# Patient Record
Sex: Female | Born: 1965
Health system: Southern US, Community
[De-identification: ages and names within clinical notes are randomized; demographics above are authoritative.]

## PROBLEM LIST (undated history)

## (undated) DIAGNOSIS — G4730 Sleep apnea, unspecified: Secondary | ICD-10-CM

## (undated) DIAGNOSIS — R4189 Other symptoms and signs involving cognitive functions and awareness: Secondary | ICD-10-CM

## (undated) DIAGNOSIS — E162 Hypoglycemia, unspecified: Secondary | ICD-10-CM

## (undated) DIAGNOSIS — K219 Gastro-esophageal reflux disease without esophagitis: Secondary | ICD-10-CM

## (undated) DIAGNOSIS — D649 Anemia, unspecified: Secondary | ICD-10-CM

## (undated) DIAGNOSIS — E039 Hypothyroidism, unspecified: Secondary | ICD-10-CM

## (undated) DIAGNOSIS — N189 Chronic kidney disease, unspecified: Secondary | ICD-10-CM

## (undated) DIAGNOSIS — J449 Chronic obstructive pulmonary disease, unspecified: Secondary | ICD-10-CM

## (undated) DIAGNOSIS — R198 Other specified symptoms and signs involving the digestive system and abdomen: Secondary | ICD-10-CM

## (undated) DIAGNOSIS — R112 Nausea with vomiting, unspecified: Secondary | ICD-10-CM

## (undated) DIAGNOSIS — Z9889 Other specified postprocedural states: Secondary | ICD-10-CM

## (undated) DIAGNOSIS — G44009 Cluster headache syndrome, unspecified, not intractable: Secondary | ICD-10-CM

## (undated) DIAGNOSIS — Z9981 Dependence on supplemental oxygen: Secondary | ICD-10-CM

## (undated) DIAGNOSIS — M5412 Radiculopathy, cervical region: Secondary | ICD-10-CM

## (undated) DIAGNOSIS — F419 Anxiety disorder, unspecified: Secondary | ICD-10-CM

## (undated) DIAGNOSIS — J939 Pneumothorax, unspecified: Secondary | ICD-10-CM

## (undated) DIAGNOSIS — R0602 Shortness of breath: Secondary | ICD-10-CM

## (undated) DIAGNOSIS — J4489 Other specified chronic obstructive pulmonary disease: Secondary | ICD-10-CM

## (undated) DIAGNOSIS — R7303 Prediabetes: Secondary | ICD-10-CM

## (undated) DIAGNOSIS — M1990 Unspecified osteoarthritis, unspecified site: Secondary | ICD-10-CM

## (undated) DIAGNOSIS — E2740 Unspecified adrenocortical insufficiency: Secondary | ICD-10-CM

## (undated) DIAGNOSIS — R2689 Other abnormalities of gait and mobility: Secondary | ICD-10-CM

## (undated) DIAGNOSIS — E785 Hyperlipidemia, unspecified: Secondary | ICD-10-CM

## (undated) DIAGNOSIS — G43909 Migraine, unspecified, not intractable, without status migrainosus: Secondary | ICD-10-CM

## (undated) HISTORY — PX: ABDOMINAL HYSTERECTOMY: SHX81

## (undated) HISTORY — PX: LOBECTOMY: SHX5089

## (undated) HISTORY — DX: Other symptoms and signs involving cognitive functions and awareness: R41.89

## (undated) HISTORY — PX: JOINT REPLACEMENT: SHX530

## (undated) HISTORY — PX: HERNIA REPAIR: SHX51

## (undated) HISTORY — PX: CHOLECYSTECTOMY: SHX55

## (undated) HISTORY — PX: CERVICAL FUSION: SHX112

## (undated) HISTORY — PX: TUBAL LIGATION: SHX77

## (undated) HISTORY — DX: Other specified symptoms and signs involving the digestive system and abdomen: R19.8

---

## 2001-12-20 ENCOUNTER — Inpatient Hospital Stay (HOSPITAL_COMMUNITY): Admission: EM | Admit: 2001-12-20 | Discharge: 2001-12-29 | Payer: Self-pay | Admitting: *Deleted

## 2001-12-20 ENCOUNTER — Encounter: Payer: Self-pay | Admitting: Family Medicine

## 2001-12-20 ENCOUNTER — Encounter: Payer: Self-pay | Admitting: *Deleted

## 2001-12-20 ENCOUNTER — Ambulatory Visit (HOSPITAL_COMMUNITY): Admission: RE | Admit: 2001-12-20 | Discharge: 2001-12-20 | Payer: Self-pay | Admitting: Family Medicine

## 2001-12-21 ENCOUNTER — Encounter (INDEPENDENT_AMBULATORY_CARE_PROVIDER_SITE_OTHER): Payer: Self-pay | Admitting: *Deleted

## 2001-12-21 ENCOUNTER — Encounter: Payer: Self-pay | Admitting: Cardiothoracic Surgery

## 2001-12-21 ENCOUNTER — Encounter: Payer: Self-pay | Admitting: General Surgery

## 2001-12-22 ENCOUNTER — Encounter: Payer: Self-pay | Admitting: Cardiothoracic Surgery

## 2001-12-23 ENCOUNTER — Encounter: Payer: Self-pay | Admitting: Cardiothoracic Surgery

## 2001-12-24 ENCOUNTER — Encounter: Payer: Self-pay | Admitting: Cardiothoracic Surgery

## 2001-12-25 ENCOUNTER — Encounter: Payer: Self-pay | Admitting: Cardiothoracic Surgery

## 2001-12-26 ENCOUNTER — Encounter: Payer: Self-pay | Admitting: Cardiothoracic Surgery

## 2001-12-27 ENCOUNTER — Encounter: Payer: Self-pay | Admitting: Cardiothoracic Surgery

## 2001-12-28 ENCOUNTER — Encounter: Payer: Self-pay | Admitting: Cardiothoracic Surgery

## 2001-12-29 ENCOUNTER — Encounter: Payer: Self-pay | Admitting: Cardiothoracic Surgery

## 2002-01-16 ENCOUNTER — Encounter: Admission: RE | Admit: 2002-01-16 | Discharge: 2002-01-16 | Payer: Self-pay | Admitting: Cardiothoracic Surgery

## 2002-01-16 ENCOUNTER — Encounter: Payer: Self-pay | Admitting: Cardiothoracic Surgery

## 2002-07-03 ENCOUNTER — Ambulatory Visit (HOSPITAL_COMMUNITY): Admission: RE | Admit: 2002-07-03 | Discharge: 2002-07-03 | Payer: Self-pay | Admitting: Family Medicine

## 2002-07-03 ENCOUNTER — Encounter: Payer: Self-pay | Admitting: Family Medicine

## 2003-06-22 ENCOUNTER — Other Ambulatory Visit: Admission: RE | Admit: 2003-06-22 | Discharge: 2003-06-22 | Payer: Self-pay | Admitting: Family Medicine

## 2006-05-07 ENCOUNTER — Other Ambulatory Visit: Admission: RE | Admit: 2006-05-07 | Discharge: 2006-05-07 | Payer: Self-pay | Admitting: Family Medicine

## 2006-05-15 ENCOUNTER — Encounter: Admission: RE | Admit: 2006-05-15 | Discharge: 2006-05-15 | Payer: Self-pay | Admitting: Family Medicine

## 2006-05-16 ENCOUNTER — Encounter: Admission: RE | Admit: 2006-05-16 | Discharge: 2006-05-16 | Payer: Self-pay | Admitting: Family Medicine

## 2006-05-21 ENCOUNTER — Ambulatory Visit: Payer: Self-pay | Admitting: Gastroenterology

## 2006-05-21 LAB — CONVERTED CEMR LAB
Basophils Absolute: 0.1 10*3/uL (ref 0.0–0.1)
Basophils Relative: 1.2 % — ABNORMAL HIGH (ref 0.0–1.0)
Eosinophils Absolute: 0.8 10*3/uL — ABNORMAL HIGH (ref 0.0–0.6)
Eosinophils Relative: 7.5 % — ABNORMAL HIGH (ref 0.0–5.0)
HCT: 42.8 % (ref 36.0–46.0)
Hemoglobin: 14.8 g/dL (ref 12.0–15.0)
Lymphocytes Relative: 35 % (ref 12.0–46.0)
MCHC: 34.6 g/dL (ref 30.0–36.0)
MCV: 91.3 fL (ref 78.0–100.0)
Monocytes Absolute: 0.7 10*3/uL (ref 0.2–0.7)
Monocytes Relative: 6.5 % (ref 3.0–11.0)
Neutro Abs: 5.6 10*3/uL (ref 1.4–7.7)
Neutrophils Relative %: 49.8 % (ref 43.0–77.0)
Platelets: 319 10*3/uL (ref 150–400)
RBC: 4.69 M/uL (ref 3.87–5.11)
RDW: 11.9 % (ref 11.5–14.6)
TSH: 3.21 microintl units/mL (ref 0.35–5.50)
Tissue Transglutaminase Ab, IgA: <3 units (ref <5)
WBC: 11 10*3/uL — ABNORMAL HIGH (ref 4.5–10.5)

## 2006-05-31 ENCOUNTER — Encounter (INDEPENDENT_AMBULATORY_CARE_PROVIDER_SITE_OTHER): Payer: Self-pay | Admitting: *Deleted

## 2006-05-31 ENCOUNTER — Ambulatory Visit: Payer: Self-pay | Admitting: Gastroenterology

## 2006-06-07 ENCOUNTER — Ambulatory Visit (HOSPITAL_COMMUNITY): Admission: RE | Admit: 2006-06-07 | Discharge: 2006-06-07 | Payer: Self-pay | Admitting: Family Medicine

## 2006-10-01 ENCOUNTER — Other Ambulatory Visit: Payer: Self-pay | Admitting: Obstetrics and Gynecology

## 2006-10-03 ENCOUNTER — Encounter: Payer: Self-pay | Admitting: Obstetrics and Gynecology

## 2006-10-03 ENCOUNTER — Inpatient Hospital Stay (HOSPITAL_COMMUNITY): Admission: AD | Admit: 2006-10-03 | Discharge: 2006-10-04 | Payer: Self-pay | Admitting: Obstetrics and Gynecology

## 2007-05-19 ENCOUNTER — Encounter: Admission: RE | Admit: 2007-05-19 | Discharge: 2007-05-19 | Payer: Self-pay | Admitting: Obstetrics and Gynecology

## 2007-05-30 ENCOUNTER — Ambulatory Visit (HOSPITAL_COMMUNITY): Admission: RE | Admit: 2007-05-30 | Discharge: 2007-05-30 | Payer: Self-pay | Admitting: Family Medicine

## 2007-09-10 ENCOUNTER — Encounter: Admission: RE | Admit: 2007-09-10 | Discharge: 2007-09-10 | Payer: Self-pay | Admitting: Obstetrics and Gynecology

## 2007-10-16 ENCOUNTER — Ambulatory Visit (HOSPITAL_COMMUNITY): Admission: RE | Admit: 2007-10-16 | Discharge: 2007-10-16 | Payer: Self-pay | Admitting: Internal Medicine

## 2007-11-06 ENCOUNTER — Encounter (HOSPITAL_COMMUNITY): Admission: RE | Admit: 2007-11-06 | Discharge: 2007-12-06 | Payer: Self-pay | Admitting: Internal Medicine

## 2007-11-07 ENCOUNTER — Encounter (INDEPENDENT_AMBULATORY_CARE_PROVIDER_SITE_OTHER): Payer: Self-pay | Admitting: Internal Medicine

## 2008-02-11 ENCOUNTER — Ambulatory Visit (HOSPITAL_COMMUNITY): Admission: RE | Admit: 2008-02-11 | Discharge: 2008-02-11 | Payer: Self-pay | Admitting: Family Medicine

## 2008-03-02 ENCOUNTER — Ambulatory Visit (HOSPITAL_COMMUNITY): Admission: RE | Admit: 2008-03-02 | Discharge: 2008-03-02 | Payer: Self-pay | Admitting: Internal Medicine

## 2008-09-20 ENCOUNTER — Ambulatory Visit (HOSPITAL_COMMUNITY): Admission: RE | Admit: 2008-09-20 | Discharge: 2008-09-20 | Payer: Self-pay | Admitting: Family Medicine

## 2008-11-16 ENCOUNTER — Encounter: Admission: RE | Admit: 2008-11-16 | Discharge: 2008-11-16 | Payer: Self-pay | Admitting: Obstetrics and Gynecology

## 2008-12-07 ENCOUNTER — Ambulatory Visit (HOSPITAL_COMMUNITY): Admission: RE | Admit: 2008-12-07 | Discharge: 2008-12-07 | Payer: Self-pay | Admitting: Internal Medicine

## 2008-12-21 ENCOUNTER — Ambulatory Visit (HOSPITAL_COMMUNITY): Admission: RE | Admit: 2008-12-21 | Discharge: 2008-12-21 | Payer: Self-pay | Admitting: Pulmonary Disease

## 2009-05-06 ENCOUNTER — Ambulatory Visit (HOSPITAL_COMMUNITY): Admission: RE | Admit: 2009-05-06 | Discharge: 2009-05-06 | Payer: Self-pay | Admitting: Pulmonary Disease

## 2009-05-27 ENCOUNTER — Ambulatory Visit (HOSPITAL_COMMUNITY): Admission: RE | Admit: 2009-05-27 | Discharge: 2009-05-27 | Payer: Self-pay | Admitting: Pulmonary Disease

## 2009-10-27 ENCOUNTER — Ambulatory Visit (HOSPITAL_COMMUNITY): Admission: RE | Admit: 2009-10-27 | Discharge: 2009-10-27 | Payer: Self-pay | Admitting: Pulmonary Disease

## 2009-11-22 ENCOUNTER — Encounter: Admission: RE | Admit: 2009-11-22 | Discharge: 2009-11-22 | Payer: Self-pay | Admitting: Obstetrics and Gynecology

## 2009-12-08 ENCOUNTER — Ambulatory Visit (HOSPITAL_COMMUNITY): Admission: RE | Admit: 2009-12-08 | Discharge: 2009-12-08 | Payer: Self-pay | Admitting: Pulmonary Disease

## 2010-02-04 ENCOUNTER — Emergency Department (HOSPITAL_COMMUNITY): Admission: EM | Admit: 2010-02-04 | Discharge: 2010-02-04 | Payer: Self-pay | Admitting: Emergency Medicine

## 2010-04-30 ENCOUNTER — Encounter: Payer: Self-pay | Admitting: Obstetrics and Gynecology

## 2010-06-25 LAB — BLOOD GAS, ARTERIAL
Acid-Base Excess: 1.7 mmol/L (ref 0.0–2.0)
Bicarbonate: 25.2 mEq/L — ABNORMAL HIGH (ref 20.0–24.0)
FIO2: 21 %
O2 Saturation: 94.9 %
Patient temperature: 37
TCO2: 22.3 mmol/L (ref 0–100)
pCO2 arterial: 36 mmHg (ref 35.0–45.0)
pH, Arterial: 7.459 — ABNORMAL HIGH (ref 7.350–7.400)
pO2, Arterial: 71.9 mmHg — ABNORMAL LOW (ref 80.0–100.0)

## 2010-07-15 LAB — BLOOD GAS, ARTERIAL
Acid-Base Excess: 0.7 mmol/L (ref 0.0–2.0)
Bicarbonate: 24.4 mEq/L — ABNORMAL HIGH (ref 20.0–24.0)
O2 Saturation: 97.5 %
Patient temperature: 37
TCO2: 21.8 mmol/L (ref 0–100)
pCO2 arterial: 36.6 mmHg (ref 35.0–45.0)
pH, Arterial: 7.439 — ABNORMAL HIGH (ref 7.350–7.400)
pO2, Arterial: 96.8 mmHg (ref 80.0–100.0)

## 2010-07-28 DIAGNOSIS — R198 Other specified symptoms and signs involving the digestive system and abdomen: Secondary | ICD-10-CM

## 2010-07-28 DIAGNOSIS — R4189 Other symptoms and signs involving cognitive functions and awareness: Secondary | ICD-10-CM

## 2010-07-28 HISTORY — DX: Other specified symptoms and signs involving the digestive system and abdomen: R19.8

## 2010-07-28 HISTORY — DX: Other symptoms and signs involving cognitive functions and awareness: R41.89

## 2010-08-22 NOTE — Op Note (Signed)
[name], [name]          ACCOUNT NO.:  [id number]   MEDICAL RECORD NO.:  [id number]          PATIENT TYPE:  INP   LOCATION:  A223                          FACILITY:  [facility]   PHYSICIAN:  Tilda Burrow, M.D. DATE OF BIRTH:  [birth year]   DATE OF PROCEDURE:  10/04/2006  DATE OF DISCHARGE:                               OPERATIVE REPORT   PREOPERATIVE DIAGNOSES:  1. Uterine descensus.  2. Rectocele.  3. Stress and urge incontinence.   POSTOPERATIVE DIAGNOSES:  1. Uterine descensus.  2. Rectocele.  3. Stress and urge incontinence.   PROCEDURES:  1. Vaginal hysterectomy, posterior repair, Jannifer Franklin, M. D.  2. Transvaginal suburethral tape by Dr. Rito Ehrlich, dictated elsewhere.   DETAILS OF PROCEDURE FINDINGS:  Uterine relaxation to the introitus with  a gentle traction, small cyst on the left ovary considered within normal  limits, normal appearing right tube and ovary, well-developed  uterosacral ligaments both sides.   DETAILS OF PROCEDURE:  The patient was taken to the operating room,  prepped and draped in the usual fashion for combined abdominal and  vaginal procedure with legs in high lithotomy support.  Dr. Rito Ehrlich  proceeded with his placement of the tension-free transvaginal tape as  per his operative report.  Upon completion of his procedure vaginal  hysterectomy was initiated.  A weighted speculum was placed in the  posterior vagina vault.  The cervix was circumscribed with Bovie  cautery, a posterior colpotomy incision performed with Mayo scissors,  identifying peritoneal cavity with ease.  The uterosacral ligament was  grasped on the left side, clamped, cut and suture ligated using Zeppelin  clamp, 0 chromic suture and the pedicle tagged with a Hemostat for  future identification.  The lower cardinal ligaments were then clamped,  cut and suture ligated on either side.  The bladder flap had been pushed  up and at this time we were able to enter into the  vesicouterine  peritoneal reflection of peritoneum without difficulty.  The upper  cardinal ligaments and the broad ligament were then serially clamped,  cut and suture ligated using Zeppelin clamps, taking small bites, Mayo  scissors for transection and 0 chromic suture ligature for ligature and  hemostasis.  We marched up the uterus on either side, maintaining close  orientation to the uterus, and the procedure was completed once the  utero-ovarian ligament, round ligament and fallopian tube were all  incorporated into a pedicle.  This went quite well and at this time  hemostasis was excellent.  We inspected the pedicles and inspected tubes  and ovaries and they were grossly within normal limits.  The left ovary  was notable for two small cysts, each less than 2 cm, they were slightly  different coloration, suggesting different ages, but both had smooth  surfaces and were translucent.  These were considered normal functional  cysts and were left alone.  The uterosacral ligaments were then sutured  and attached to the cuff with #2-0 Prolene suture, sewing the right  third of the cul-de-sac to the right uterosacral ligament and the left  third of the cul-de-sac to the left uterosacral  ligament, being careful  to maintain suturing on the medial aspects of the ligament and to stay  away from the ureters.  This went quite well and then the peritoneum  closed with #2-0 chromic closure of the bladder flap over the cuff.  The  vaginal vault was then closed in a transverse fashion using #2-0 chromic  and resulted in good elevation of the vaginal apex.  There was some  oozing on the right corner which responded relatively well to the final  sutures placed on that side.  The posterior repair followed.   Posterior repair consisted of opening the vaginal mucosa over the  perineal body, elevating the posterior vaginal mucosa, which went  amazingly easy due to the flexibility and weakness of the  tissues.  With  a double-gloved index finger of the right hand in the rectum, we were  able to use Allis clamps and grasp perirectal tissue on either side that  was of firmer support and felt to represent both sides of the perineal  body defect.  These were pulled into the midline and sewn together with  a series of interrupted #2-0 Vicryl sutures.  The post-surgical result  was to have a significantly improved support to the posterior perineal  body.  The vagina still allowed insertion of two fingers with ease.  The  vaginal mucosa was trimmed only slightly as minimal changes in the  introitus size were caused.  The vagina was then packed with Betadine  soaked solution, Foley catheter having been in place throughout the  case.  Sponge and needle counts were correct.  Patient went to recovery  room in good condition and will be followed up for routine post surgical  care.      Tilda Burrow, M.D.  Electronically Signed     JVF/MEDQ  D:  10/04/2006  T:  10/04/2006  Job:  [id number]   cc:   Dennie Maizes, M.D.  Fax: [phone]   Madelin Rear. Sherwood Gambler, MD  Fax: [phone]

## 2010-08-22 NOTE — Discharge Summary (Signed)
[name], [name]          ACCOUNT NO.:  [id number]   MEDICAL RECORD NO.:  [id number]          PATIENT TYPE:  INP   LOCATION:  A223                          FACILITY:  [facility]   PHYSICIAN:  Tilda Burrow, M.D. DATE OF BIRTH:  Dec 06, 1965   DATE OF ADMISSION:  10/03/2006  DATE OF DISCHARGE:  06/27/2008LH                               DISCHARGE SUMMARY   ADMITTING DIAGNOSES:  1. First degree uterine descensus.  2. Rectocele.  3. Pelvic relaxation.  4. Dyspareunia.  5. Combined stress and urge incontinence.   DISCHARGE DIAGNOSES:  1. First degree uterine descensus.  2. Rectocele.  3. Pelvic relaxation.  4. Dyspareunia.  5. Combined stress and urge incontinence.   PROCEDURE:  1. Vaginal hysterectomy, posterior repair, Jannifer Franklin, M.D.  2. Transvaginal tension-free taping for stress incontinence, Dennie Maizes, M.D.   DISCHARGE MEDICATIONS:  1. Cipro 250 b.i.d. x7 days, dispense 14.  2. Vicodin 5/500, #40, 1-2 q.4h. p.r.n. pain, refill x1.   FOLLOWUP:  1. September 24, 2006, Jannifer Franklin.  2. Two to 3 weeks, Dr. Rito Ehrlich.   HOSPITAL SUMMARY:  This 45 year old female is admitted for posterior  uterine descensus, dyspareunia, heavy menstrual periods and Dr.  Chancy Milroy evaluation identified both stress and urinary urge  incontinency, see admitting history and consultation notes.  The patient  was admitted with excellent hemoglobin of 13, hematocrit 40.6, potassium  of 4.2, BUN 5, creatinine 0.62, EGFR greater than 60.  She underwent  vaginal hysterectomy, posterior repair along with TVT by Dr. Rito Ehrlich on  26 June, 2008.  The following day hemoglobin was 10.9, hematocrit 31.2,  white count 11,900.  This was greater drop than expected, suggesting a  little bit of intraabdominal oozing.  Nonetheless, the patient had  excellent bowel sounds, nontender abdomen, was completely comfortable  and stable for  discharge tolerating food.  She will be discharged this  afternoon once  she has proven that she can void.  Potassium is 4.2, BUN 3, creatinine  0.64, EGFR greater than 60.  She is having no pain complaints, will be  discharged on Cipro and mild analgesics with routine post surgical  instructions given.      Tilda Burrow, M.D.  Electronically Signed     JVF/MEDQ  D:  10/04/2006  T:  10/04/2006  Job:  [id number]   cc:   Madelin Rear. Sherwood Gambler, MD  Fax: [phone]   Dennie Maizes, M.D.  Fax: [phone]

## 2010-08-22 NOTE — Op Note (Signed)
[name], [name]          ACCOUNT NO.:  [id number]   MEDICAL RECORD NO.:  [id number]          PATIENT TYPE:  INP   LOCATION:  A223                          FACILITY:  [facility]   PHYSICIAN:  Dennie Maizes, M.D.   DATE OF BIRTH:  1965/08/25   DATE OF PROCEDURE:  10/03/2006  DATE OF DISCHARGE:  10/01/2006                               OPERATIVE REPORT   PREOP DIAGNOSIS:  Stress urinary incontinence.   POSTOPERATIVE DIAGNOSIS:  Stress urinary incontinence.   OPERATIVE PROCEDURE:  Transvaginal tape suburethral sling procedure.   ANESTHESIA:  Spinal.   SURGEON:  Dennie Maizes, M.D.   ASSISTANT:  Tilda Burrow, M.D.   COMPLICATIONS:  None.   ESTIMATED BLOOD LOSS:  Minimal.   DRAINS:  A 16-French Foley catheter in the bladder.   INDICATIONS FOR PROCEDURE:  This is a 45 year old female who has stress  urinary incontinence associated with uterine descensus and menorrhagia.  She is scheduled to undergo a vaginal hysterectomy by Dr. Emelda Fear.  I  plan to do transvaginal tape suburethral sling procedure at the same  time.   DESCRIPTION OF PROCEDURE:  Spinal anesthesia was induced and the patient  was placed on the OR table in the high lithotomy position.  The lower  abdomen and genitalia were prepped and draped in a sterile fashion.  A  16-French Foley catheter was inserted into the bladder and the clear  urine was drained.  The pubic tubercles at a point about 1.5 cm of bone  lateral to the pubic tubercle were marked on the suprapubic area where  10 mL of sterile saline was used for perivesical injection for  hydrodissection.  The mid urethra was then held with two Allis forceps.  Five  mL of saline was then injected submucosally to submucosal planes  on both sides.  A small mid urethral incision was then made.  The mucosa  was separated from the underlying structures by blunt and sharp  dissection.  The trocar carrying the blue guide tube was then inserted  on the right  side with distal guidance.  The trocar was inserted behind  the pubic symphysis to exit through the previously marked area on the  skin.  Cystoscopy was done and the trocar was found to be outside the  bladder.  The guide tube was then pulled through the suprapubic  incision.   A similar procedure was done on the left side.  The guide tube was found  to be outside the bladder.  The urethral sling (Uretex system) was then  attached to the guide tubes and pulled through the suprapubic incision.  A 16-French Foley catheter was then reinserted.  The tension of the  sling was then adjusted.  After this a pair of Metzenbaum scissors could  be passed between the urethra and the sling.  The plastic tubes covering  the sling were removed on both sides The redundant sling was then  excised at the level of subcutaneous tissues on both sides.  The  urethral incision was then closed using 3-0 Vicryl.  The suprapubic  incision was closed using 4-0 subcuticular strictures.  The estimated  blood loss for this procedure was minimal.  Dr. Emelda Fear then proceeded  with the vaginal hysterectomy, anterior and posterior repair.      Dennie Maizes, M.D.  Electronically Signed     SK/MEDQ  D:  10/03/2006  T:  10/03/2006  Job:  [id number]   cc:   Tilda Burrow, M.D.  Fax: [phone]

## 2010-08-22 NOTE — Procedures (Signed)
[name], [name]          ACCOUNT NO.:  [id number]   MEDICAL RECORD NO.:  [id number]          PATIENT TYPE:  OUT   LOCATION:  RESP                          FACILITY:  [facility]   PHYSICIAN:  Edward L. Juanetta Gosling, M.D.DATE OF BIRTH:  [birth year]   DATE OF PROCEDURE:  12/07/2008  DATE OF DISCHARGE:  12/07/2008                            PULMONARY FUNCTION TEST   1. Spirometry shows a mild ventilatory defect with evidence of airflow      obstruction.  2. Lung volumes are normal.  3. DLCO is mildly reduced.  4. Arterial blood gases normal.  5. There is significant bronchodilator improvement.  6. This is consistent with the clinical diagnosis of COPD.      Edward L. Juanetta Gosling, M.D.  Electronically Signed     ELH/MEDQ  D:  12/07/2008  T:  12/08/2008  Job:  [id number]   cc:   Madelin Rear. Sherwood Gambler, MD  Fax: [phone]

## 2010-08-22 NOTE — H&P (Signed)
NAME:  [name], [name]          ACCOUNT NO.:  [id number]   MEDICAL RECORD NO.:  [id number]          PATIENT TYPE:  AMB   LOCATION:  DAY                           FACILITY:  [facility]   PHYSICIAN:  Dennie Maizes, M.D.   DATE OF BIRTH:  01-04-1966   DATE OF ADMISSION:  DATE OF DISCHARGE:  LH                              HISTORY & PHYSICAL   CHIEF COMPLAINT:  Urinary urgency, urge incontinence, stress urine  incontinence.   HISTORY OF PRESENT ILLNESS:  This 45 year old female was evaluated by  Dr. Emelda Fear for posterior descensus, dyspareunia and heavy menstrual  periods.  She was scheduled to undergo a vaginal hysterectomy.  She also  had mixed type of urinary incontinence.  She was referred to me for  further evaluation and management.   The patient has troublesome urinary incontinence for the past 1 year  which is getting worse.  She has urinary frequencies x5 to 6 and  nocturia x4 to 5.  She also has urinary urgency and urge urinary  incontinence.  She has stress urinary incontinence during coughing and  sneezing.  Urge incontinence is worse than the stress urinary  incontinence.  She uses 3 pads per day.  She did not have any voiding  difficulty, hematuria, kidney stones or urinary tract infections.   PAST MEDICAL HISTORY:  History of COPD, bronchial asthma, surgery for  pneumothorax, cholecystectomy, tubal ligation.   MEDICATIONS:  Advair, Singulair, Claritin, calcium supplements,  albuterol and Xopenex inhalers.   ALLERGIES:  SHE IS ALLERGIC TO PENICILLIN, DETROL LA AND GUAIFENESIN.   SOCIAL HISTORY:  Patient is married, she has 2 children, ages 28 and 8.   FAMILY HISTORY:  Positive for hypertension, diabetes mellitus, skin  cancer, breast cancer, thyroid disease, osteoporosis and heart disease.   EXAMINATION:  HEAD, EYES, EARS, NOSE AND THROAT: Normal.  NECK: No masses.  LUNGS: Clear to auscultation.  HEART: Regular rate and rhythm, no murmurs.  ABDOMEN: Soft, no  palpable flank or CVA tenderness.  Bladder is not  palpable.  PELVIC: Negative.   Patient has undergone full evaluation in the office.  Post void bleeding  was small, 50 mL.  CMV revealed normal bladder sensations and the  patient could feel the pulling of the bladder to a volume of 96 mL.  He  developed a desire to void at 143 mL and the bladder capacity was 255  mL.  Leak volume  pressure was 90 cm of water.  She had mild stress  urinary incontinence during coughing in the upright position.  Cystoscopy revealed a normal bladder.  There was no abnormality inside  the bladder.   IMPRESSION:  1. Urinary urgency.  2. Urge incontinence.  3. Stress urinary incontinence.   PLAN:  I discussed with the patient and the family regarding the next  step of urinary incontinence and management options.  She is scheduled  to undergo an transvaginal tape procedure for stress urinary  incontinence.  He may continue to have urinary incontinence for which he  will need medications.  I also explained to the patient regarding the  operative details of the transvaginal tape  procedure.  The diagnosis,  operative details, alternative treatments, outcome, possible risks and  complications were explained to the patient.  Complications like  infection, bleeding, injury to the bladder, ureters, blood vessels  and  intestines were explained to the patient.  She may need prolonged  catheter drainage and further surgery.  Her questions were answered and  she has agreed for the procedure to be done.      Dennie Maizes, M.D.  Electronically Signed     SK/MEDQ  D:  10/02/2006  T:  10/02/2006  Job:  [id number]   cc:   Tilda Burrow, M.D.  Fax: [phone]

## 2010-08-25 NOTE — Discharge Summary (Signed)
   NAME:  [name], [name]                    ACCOUNT NO.:  [id number]   MEDICAL RECORD NO.:  [id number]                   PATIENT TYPE:  INP   LOCATION:  A326                                 FACILITY:  [facility]   PHYSICIAN:  Dalia Heading, M.D.               DATE OF BIRTH:  08/23/1965   DATE OF ADMISSION:  DATE OF DISCHARGE:  12/21/2001                                 DISCHARGE SUMMARY   HOSPITAL COURSE SUMMARY:  The patient is a 45 year old white female who for  a week had bronchitis.  She is a smoker. She started having right sided  chest pain a day earlier.  She presented to the emergency room with a right  tension pneumothorax.  A Heimlich tube was placed, and a follow up chest x-  ray revealed almost complete resolution of the right pneumothorax.  A small  apical pneumothorax was noted.  She was also noted to have a bleb in the  apex of the right chest.   She was admitted to the hospital and a chest tube was placed on suction.  The following morning, chest x-ray revealed a 20-30% recurrence of the  pneumothorax.  She does have a continuing air leak.  This was discussed with  Dr. Maudie Flakes of thoracic surgery at [facility].  Due to the ongoing  air leak, and possible need for a VATS procedure, the patient is being  transferred to [facility] at this procedure is not available here  at [facility].  The reasons, risks, and benefits of the transfer  were fully explained to the patient and husband who gave informed consent  for transfer.  The patient will be transferred by [facility].   PRINCIPAL DIAGNOSIS:  Right tension pneumothorax with ongoing air leak.   PROCEDURE:  Right chest tube placement on 12/20/2001.                                               Dalia Heading, M.D.    MAJ/MEDQ  D:  12/21/2001  T:  12/21/2001  Job:  [id number]   cc:   [facility]   Mikey Bussing, M.D.  [address]  [place]  [place] [address]  Fax: [phone]

## 2010-08-25 NOTE — Assessment & Plan Note (Signed)
[facility]                         GASTROENTEROLOGY OFFICE NOTE   NAME:STEPHENSAalayah, [name]                 MRN:          [id number]  DATE:05/21/2006                            DOB:          [birth year]    REASON FOR REFERRAL:  Dr. Dimple Casey asked me to evaluate [name] in  consultation regarding chronic abdominal discomfort, intermittent bright  red blood per rectum.   HISTORY OF PRESENT ILLNESS:  [name] is a very pleasant 45 year old  woman who has had at least 2 to 3 years of upper gastrointestinal  discomfort. She describes her epigastrium is feeling balled up and  painful within minutes after eating a meal. She also has a very brisk  gastric colic reflex and she moves her bowels within a half hour of  eating just about any meal. She will go 3 to 4 times. The discomfort she  feels in her upper abdomen usually is relieved after she moves her  bowels 2 to 3 times. She has noticed that lactose containing products  certainly cause a lot of discomfort, but she cannot pinpoint anything  other than that. She does avoid dairy as best she can. She has noticed  some bright red blood per rectum intermittently over the past 6 to 8  months and she even describes some frank black tarry-like loose stools  occasionally. She had recent lab tests two weeks ago. A CBC was done,  but complete metabolic profile was essentially normal.   REVIEW OF SYSTEMS:  Her weight has been stable. The review of systems is  otherwise essentially normal and is available under nursing intake  sheet.   PAST MEDICAL HISTORY:  1. Asthma.  2. Allergies.  3. Tubal ligation.  4. Status post cholecystectomy in 1993.   CURRENT MEDICATIONS:  1. Advair.  2. Singulair.  3. Over-the-counter Claritin.  4. Provera.   ALLERGIES:  PENICILLIN.   SOCIAL HISTORY:  Married with two sons. Smokes a half pack a day. Rarely  drinks alcohol.   FAMILY HISTORY:  Mother and father with  colon polyps. No colon cancer is  in the family.   PHYSICAL EXAMINATION:  Height is 5 feet, 7 inches; 119 pounds. Blood  pressure 110/80. Pulse 92.  CONSTITUTIONAL: Generally well-appearing.  NEUROLOGIC: Alert and oriented x3.  EYES: Extraocular movements intact.  MOUTH: Oropharynx moist. No lesions.  NECK: Supple. No lymphadenopathy.  CARDIOVASCULAR: HEART: Regular rate and rhythm.  LUNGS:  Clear to auscultation bilaterally.  ABDOMEN: Soft and nontender, nondistended. Normal bowel sounds.  EXTREMITIES: No lower extremity edema.   ASSESSMENT/PLAN:  A 45 year old woman with chronic upper GI discomforts  as well as intermittent bright red blood per rectum. Possibly she has  peptic ulcer disease though she rarely takes NSAIDs. Her upper GI  symptoms may be acid-related so I am giving her samples of Protonix and  she will take 20 to 30 minutes prior to her dinner meal on a daily  basis. Since these symptoms have been ongoing for years, I think it is  reasonable to proceed with EGD at her soonest convenience. Will also get  a CBC as well as thyroid  testing and TTG to check for underlying celiac  sprue. She also has colon polyps in her family and has mild intermittent  rectal bleeding and we should proceed with colonoscopy at the same time  as her upper endoscopy.     Rachael Fee, MD  Electronically Signed    DPJ/MedQ  DD: 05/21/2006  DT: 05/21/2006  Job #: [id number]   cc:   Magnus Sinning. Rice, M.D.

## 2010-08-25 NOTE — Op Note (Signed)
NAME:  [name], [name]                    ACCOUNT NO.:  [id number]   MEDICAL RECORD NO.:  [id number]                   PATIENT TYPE:  INP   LOCATION:  3312                                 FACILITY:  [facility]   PHYSICIAN:  Kerin Perna III, M.D.           DATE OF BIRTH:  March 14, 1966   DATE OF PROCEDURE:  12/24/2001  DATE OF DISCHARGE:                                 OPERATIVE REPORT   OPERATION:  Right video assisted thorascopic surgery (VATS) with stapling of  apical blebs, and mechanica pleurodesis.   PREOPERATIVE DIAGNOSIS:  Spontaneous right pneumothorax.   POSTOPERATIVE DIAGNOSIS:  Spontaneous right pneumothorax.   SURGEON:  Kerin Perna, M.D.   ASSISTANT:  Gwenith Daily. Tyrone Sage, M.D.   ANESTHESIA:  General.   INDICATIONS:  The patient is a 45 year old white female who presented to the  hospital with her first episode of right spontaneous pneumothorax.  She was  treated with a vascular catheter and Heimlich valve in an outside hospital  with re-expansion of a large pneumothorax but then with recurrent  pneumothorax and a persistent air leak.  She was transferred to [facility] where a tube thoracostomy was performed and she had a persistent  small apical pneumothorax with a persistent air leak.  Right VATS with  stapling of blebs and pleurodesis was recommended for treatment of her  spontaneous pneumothorax with a persistent air leak.  Prior to surgery I  discussed the procedure with the patient and her husband.  I discussed the  indication and expected benefits of right VATS, stapling of blebs and  pleurodesis for treatment of her spontaneous right pneumothorax.  I  discussed with the patient the location of these surgical incisions, the use  of a postoperative chest tube, the requirement for general anesthesia, and  the expected postoperative hospital recovery.  I reviewed with the patient  the risks of the procedure to her including the risks of bleeding,  infection, persistent air leak, and pneumonia.  She understood the  alternatives to this operation would be a prolonged course with a chest tube  drainage system.  She agreed to proceed with the operations as planned under  what I felt was an informed consent.   PROCEDURE:  The patient was brought to the operating room and placed supine  on the operating room table where general anesthesia was induced under  invasive hemodynamic monitoring.  The right chest was prepped and draped  after the previous chest tube had been removed.  Three VATS portal incisions  were made in the anterior chest, then axillary line and the posterior right  thorax.  The video camera was inserted and the lung was inspected.  There  were some old adhesions at the right apex.  There were some apical blebs  located in this area as well.  The adhesions were taken down using  electrocautery and the areas of blebs were removed using the GIA  thoracoscopic stapling device.  Three small slivers of lung were removed  with the apical blebs.  The area was inspected and hemostasis was achieved.  Next a mechanical pleurodesis of the apex and lateral and anterior parietal  pleural surfaces was  performed.  A new 36-French chest tube was placed at the previous insertion  site and then directed to the apex.  It was secured to the skin.  The VATS  portal incisions were then closed in layers using Vicryl, and the patient  was reversed from anesthesia, extubated, and returned to the recovery room  in stable condition.                                               Mikey Bussing, M.D.    PV/MEDQ  D:  12/24/2001  T:  12/25/2001  Job:  [id number]   cc:   [facility] office   Dalia Heading, M.D.   Kirk Ruths, M.D.  [address]  [place]  [place] [address]  Fax: [phone]

## 2010-08-25 NOTE — Consult Note (Signed)
NAME:  [name], [name] NO.:  [id number]   MEDICAL RECORD NO.:  [id number]                   PATIENT TYPE:  INP   LOCATION:  3312                                 FACILITY:  [facility]   PHYSICIAN:  Mikey Bussing, M.D.           DATE OF BIRTH:  27-Sep-1965   DATE OF CONSULTATION:  12/21/2001  DATE OF DISCHARGE:                                   CONSULTATION   PHYSICIAN REQUESTING CONSULTATION:  Dalia Heading, M.D., [facility].   PRIMARY CARE PHYSICIAN:  Kirk Ruths, M.D., [place], [place].   REASON FOR CONSULTATION:  Spontaneous right pneumothorax.   CHIEF COMPLAINT:  Chest pain and shortness of breath.   HISTORY OF PRESENT ILLNESS:  I was asked to evaluate this 45 year old white  female for thoracic surgical therapy of her first episode of right  spontaneous pneumothorax.  The patient had a recent episode of bronchitis  and was treated by her primary care physician with antibiotics and  expectorants and albuterol inhaler.  Following an episode of significant  coughing yesterday, she developed chest pain and shortness of breath.  A  chest x-ray at [facility] showed a 70% right pneumothorax.  Dr.  [name] admitted the patient and placed a dark catheter in the right  pleural space with a Heimlich valve.  This initially significantly improved  the pneumothorax and her symptoms; however, on today's x-ray she had  recurrent 30% right pneumothorax with a persistent air leak and she was felt  to be a candidate for thoracic surgical consultation at [facility].  The  patient denies any previous trauma to her chest.  She denies previous  episodes of spontaneous pneumothorax.  She has no knowledge of abnormal  prior chest x-rays.   PAST MEDICAL HISTORY:  1. No chronic medications other than an albuterol inhaler.  2. Allergy to penicillin.  3. Surgical history positive for laparoscopic cholecystectomy and tubal  ligation.  4. History of chronic bronchitis and asthmatic bronchitis.   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  She smokes one pack per day.  She does not use alcohol significantly.  She  is married and lives with her husband who is in good health.   FAMILY HISTORY:  No family history of spontaneous pneumothorax.   REVIEW OF SYSTEMS:  No weight loss or fever or night sweats.  No history of  TB.  No history of pneumonia or chronic hospitalization.  No history of  cardiac murmur or rheumatic fever as a child.  GI review negative for blood  per rectum, positive for gallstones status post laparoscopic  cholecystectomy.  Neurologic history negative.  Vascular review negative for  DVT or claudication.  Neurologic review negative for syncope, seizure, or  stroke.  Endocrine review negative for diabetes or thyroid disorder.  Hematologic review negative for bleeding disorder, blood transfusion.  She  is right-hand dominant.  PHYSICAL EXAMINATION:  VITAL SIGNS:  On transfer to [facility], she is 5  feet 6 inches and weighs 115 pounds.  Blood pressure is 122/70.  Heart rate  is 78 and regular.  Respirations are 22.  Saturation 94% on 2 liters.  GENERAL APPEARANCE:  A thin, anxious, middle-aged white female accompanied  by her husband in the transitional unit in no distress.  She has a right  small caliber chest tube extending from her right thorax then connected to a  Pleur-evac chamber.  ENT:  Normocephalic.  Full EOMs.  NECK:  Without JVD, subcutaneous air, or mass.  LUNGS:  Reveal diminished breath sounds on the left side.  There are  scattered wheezes.  CARDIAC EXAM:  Regular rhythm without murmur or rub.  ABDOMINAL EXAM:  Scaphoid soft, nontender, no mass.  EXTREMITIES:  Without cyanosis, clubbing, or edema.  Pulses are 2+ in all  extremities.  RECTAL EXAM:  Deferred.  SKIN:  Warm, clear, and dry.  NEUROLOGIC EXAM:  Alert and oriented x3 with  full motor function.   LABORATORY DATA:  Reviewed her outside chest x-rays which shows the dark  catheter in place but with a significant right pneumothorax.  The catheter  is probably in the fissure.   PLAN:  The patient will have the temporary catheter removed and a new #20  French chest tube placed under local 1% lidocaine anesthesia and informed  consent.  If the air leak persists more than 48 hours, then a right VATS  procedure, stapling of __________  pleurodesis will be recommended.  I  discussed this with the patient and husband and they understand the plan.  She gave her consent for the procedure.   Under local 1% lidocaine anesthesia and sterile prep and drape, the #20  French chest tube was placed in the right sixth intercostal space and  advanced toward the apex.  There was good air movement in the Pleur-evac  chamber with a positive air leak.  The chest tube was sutured to the skin  and a sterile dressing applied.   Thank you very much for this consultation.                                               Mikey Bussing, M.D.    PV/MEDQ  D:  12/21/2001  T:  12/21/2001  Job:  [id number]   cc:   Dalia Heading, M.D.   Kirk Ruths, M.D.  [address]  [place]  [place] [address]  Fax: [phone]

## 2010-08-25 NOTE — H&P (Signed)
NAME:  [name], [name]                    ACCOUNT NO.:  [id number]   MEDICAL RECORD NO.:  [id number]                   PATIENT TYPE:  EMS   LOCATION:  ED                                   FACILITY:  [facility]   PHYSICIAN:  Dalia Heading, M.D.               DATE OF BIRTH:  05/31/1965   DATE OF ADMISSION:  12/20/2001  DATE OF DISCHARGE:                                HISTORY & PHYSICAL   CHIEF COMPLAINT:  Right-sided chest pain.   HISTORY OF PRESENT ILLNESS:  The patient is a 45 year old white female who  has been recently treated for bronchitis with antibiotics who has been  coughing heavily.  She started experiencing right-sided chest pain several  days ago and now presents to the emergency room with a right tension  pneumothorax.  The patient is a smoker.   PAST MEDICAL HISTORY:  For the most part, unremarkable.   PAST SURGICAL HISTORY:  Noncontributory.   CURRENT MEDICATIONS:  Albuterol inhaler, cough syrup, an antibiotic.   ALLERGIES:  PENCILLIN.   REVIEW OF SYSTEMS:  The patient is a smoker.   PHYSICAL EXAMINATION:  GENERAL:  The patient is a well-developed, well-  nourished white female who is fairly skinny, in no acute distress.  She is  afebrile.  She is complaining of a little shortness of breath.  VITAL SIGNS:  Stable.  Respiratory rate 27.  RESPIRATORY:  No lung sounds are heard on the right sound.  CARDIAC:  Heart examination reveals a regular rate and rhythm without S3,  S4, or murmurs.   LABORATORY DATA:  Chest x-ray reveals a tension pneumothorax on the right  side with some deviation of the trachea and mediastinal structures to the  left.   PROCEDURE NOTE:  The patient was placed in the left lateral decubitus  position.  Informed consent was obtained.  The right mid axillary line was  prepped and draped using the usual sterile technique with Betadine.  Xylocaine 1% was used for local anesthesia.  A small caliber chest tube was  placed at the fifth  intercostal space without difficulty.  This was attached  to chest tube suction at -20 cmHg pressure.  A chest x-ray is pending.   IMPRESSION:  Right tension pneumothorax.    PLAN:  The patient will be admitted to the hospital for management of her  chest tube.  She will also be placed on ciprofloxacin 500 mg p.o. b.i.d.                                                Dalia Heading, M.D.    MAJ/MEDQ  D:  12/20/2001  T:  12/20/2001  Job:  [id number]   cc:   Kirk Ruths, M.D.  [address]  [place]  [place] [address]  Fax: [phone]

## 2010-08-25 NOTE — Discharge Summary (Signed)
   NAME:  [name], [name]                    ACCOUNT NO.:  [id number]   MEDICAL RECORD NO.:  [id number]                   PATIENT TYPE:  INP   LOCATION:  3312                                 FACILITY:  [facility]   PHYSICIAN:  Kerin Perna, M.D.               DATE OF BIRTH:  1965/05/02   DATE OF ADMISSION:  12/21/2001  DATE OF DISCHARGE:  12/29/2001                                 DISCHARGE SUMMARY   ADMITTING DIAGNOSIS:  Right spontaneous pneumothorax.   DISCHARGE DIAGNOSIS:  Right spontaneous pneumothorax.   HOSPITAL COURSE:  The patient was admitted to [facility] on  December 21, 2001 after being seen in the emergency department by Dr. Donata Clay secondary to a right spontaneous pneumothorax.  A chest tube was  placed in the emergency room.  Despite chest tube placement, the patient had  a persistent air leak that Dr. Donata Clay felt required surgical correction.  On December 24, 2001, he performed a right video-assisted thoracostomy with  disabling of blebs and mechanical pleurodesis.  Postoperatively, the patient  had chest tube placed which was discontinued appropriately.  Followup chest  x-ray revealed the patient had no significant pneumothorax.  She was  subsequently deemed stable for discharge home on December 29, 2001.   MEDICATIONS AT TIME OF DISCHARGE:  1. Tylox one to two tabs every 4-6 hours as needed for pain.  2. Tequin 400 mg one tablet daily for 7 days.  3. The patient was also instructed to resume her home albuterol inhaler and     multivitamins.   ACTIVITY:  The patient was told no driving or strenuous activity.   DIET:  Regular.   WOUND CARE:  The patient was told she could shower and clean her incisions  with soap and water.   DISPOSITION:  Home.   FOLLOW UP:  The patient was told to see Dr. Donata Clay in 3 weeks.  The  office will call and verify time and date of this appointment.  She was told  to go have a chest x-ray taken at the  [facility] 1 hour  before this appointment.     Levin Erp. Steward, P.A.                      Kerin Perna, M.D.    BGS/MEDQ  D:  01/14/2002  T:  01/17/2002  Job:  [id number]

## 2010-09-13 ENCOUNTER — Ambulatory Visit (INDEPENDENT_AMBULATORY_CARE_PROVIDER_SITE_OTHER): Payer: Self-pay | Admitting: Internal Medicine

## 2010-10-26 ENCOUNTER — Ambulatory Visit (INDEPENDENT_AMBULATORY_CARE_PROVIDER_SITE_OTHER): Payer: Self-pay | Admitting: Internal Medicine

## 2010-10-31 ENCOUNTER — Encounter (INDEPENDENT_AMBULATORY_CARE_PROVIDER_SITE_OTHER): Payer: Self-pay

## 2010-11-20 ENCOUNTER — Ambulatory Visit (INDEPENDENT_AMBULATORY_CARE_PROVIDER_SITE_OTHER): Payer: Self-pay | Admitting: Internal Medicine

## 2011-01-24 LAB — TYPE AND SCREEN
ABO/RH(D): A NEG
Antibody Screen: NEGATIVE

## 2011-01-24 LAB — BASIC METABOLIC PANEL
BUN: 3 — ABNORMAL LOW
BUN: 5 — ABNORMAL LOW
CO2: 27
CO2: 31
Calcium: 8.5
Calcium: 9.9
Chloride: 104
Chloride: 109
Creatinine, Ser: 0.62
Creatinine, Ser: 0.64
GFR calc Af Amer: >60
GFR calc Af Amer: >60
GFR calc non Af Amer: >60
GFR calc non Af Amer: >60
Glucose, Bld: 109 — ABNORMAL HIGH
Glucose, Bld: 91
Potassium: 4.2
Potassium: 4.3
Sodium: 139
Sodium: 139

## 2011-01-24 LAB — CBC
HCT: 31.2 — ABNORMAL LOW
HCT: 40.6
Hemoglobin: 10.9 — ABNORMAL LOW
Hemoglobin: 13.9
MCHC: 34.3
MCHC: 34.8
MCV: 92.6
MCV: 92.6
Platelets: 256
Platelets: 291
RBC: 3.37 — ABNORMAL LOW
RBC: 4.39
RDW: 12.5
RDW: 12.6
WBC: 11.9 — ABNORMAL HIGH
WBC: 9.4

## 2011-01-24 LAB — DIFFERENTIAL
Basophils Absolute: 0
Basophils Relative: 0
Eosinophils Absolute: 0.5
Eosinophils Relative: 4
Lymphocytes Relative: 23
Lymphs Abs: 2.8
Monocytes Absolute: 1.1 — ABNORMAL HIGH
Monocytes Relative: 9
Neutro Abs: 7.5
Neutrophils Relative %: 63

## 2011-01-24 LAB — ABO/RH: ABO/RH(D): A NEG

## 2011-03-13 ENCOUNTER — Emergency Department (HOSPITAL_COMMUNITY)
Admission: EM | Admit: 2011-03-13 | Discharge: 2011-03-13 | Disposition: A | Discharge status: Home / Self Care | Payer: Self-pay | Attending: Emergency Medicine | Admitting: Emergency Medicine

## 2011-03-13 ENCOUNTER — Encounter (HOSPITAL_COMMUNITY): Payer: Self-pay

## 2011-03-13 DIAGNOSIS — Z9851 Tubal ligation status: Secondary | ICD-10-CM | POA: Insufficient documentation

## 2011-03-13 DIAGNOSIS — R51 Headache: Secondary | ICD-10-CM | POA: Insufficient documentation

## 2011-03-13 DIAGNOSIS — J45909 Unspecified asthma, uncomplicated: Secondary | ICD-10-CM | POA: Insufficient documentation

## 2011-03-13 DIAGNOSIS — Z9079 Acquired absence of other genital organ(s): Secondary | ICD-10-CM | POA: Insufficient documentation

## 2011-03-13 DIAGNOSIS — E785 Hyperlipidemia, unspecified: Secondary | ICD-10-CM | POA: Insufficient documentation

## 2011-03-13 HISTORY — DX: Hyperlipidemia, unspecified: E78.5

## 2011-03-13 HISTORY — DX: Cluster headache syndrome, unspecified, not intractable: G44.009

## 2011-03-13 HISTORY — DX: Chronic obstructive pulmonary disease, unspecified: J44.9

## 2011-03-13 MED ORDER — METOCLOPRAMIDE HCL 5 MG/ML IJ SOLN
10.0000 mg | Freq: Once | INTRAMUSCULAR | Status: AC
  Administered 2011-03-13: 10 mg via INTRAMUSCULAR
  Filled 2011-03-13: qty 2

## 2011-03-13 MED ORDER — DIPHENHYDRAMINE HCL 50 MG/ML IJ SOLN
25.0000 mg | Freq: Once | INTRAMUSCULAR | Status: AC
  Administered 2011-03-13: 25 mg via INTRAMUSCULAR
  Filled 2011-03-13: qty 1

## 2011-03-13 MED ORDER — DEXAMETHASONE 6 MG PO TABS
10.0000 mg | ORAL_TABLET | Freq: Once | ORAL | Status: AC
  Filled 2011-03-13: qty 1

## 2011-03-13 MED ORDER — DEXAMETHASONE SODIUM PHOSPHATE 10 MG/ML IJ SOLN
INTRAMUSCULAR | Status: AC
  Administered 2011-03-13: 10 mg via ORAL
  Filled 2011-03-13: qty 1

## 2011-03-13 MED ORDER — KETOROLAC TROMETHAMINE 30 MG/ML IJ SOLN
30.0000 mg | Freq: Once | INTRAMUSCULAR | Status: AC
  Administered 2011-03-13: 30 mg via INTRAMUSCULAR
  Filled 2011-03-13: qty 1

## 2011-03-13 NOTE — ED Provider Notes (Signed)
History     CSN: [id number] Arrival date & time: 03/13/2011  7:53 PM   Chief Complaint  Patient presents with  . Headache   HPI Pt was seen at 2030.  Per pt, c/o gradual onset and persistence of constant acute flair of her chronic headache x2 weeks.  Describes the headache as per her usual chronic headache pain pattern "for years."  Denies headache was sudden or maximal in onset or at any time.  Denies visual changes, no focal motor weakness, no tingling/numbness in extremities, no fevers, no neck pain, no rash.  States she has been taking OTC tylenol and motrin without sustained relief.  Has been out of her usual verapamil to treat her headaches due to "national backorder."   Past Medical History  Diagnosis Date  . Rectal discharge 07/28/2010  . Sluggishness 07/28/2010  . Cluster headaches   . COPD (chronic obstructive pulmonary disease)   . Asthma   . Hyperlipemia     Past Surgical History  Procedure Date  . Abdominal hysterectomy   . Lobectomy     right lung   . Cholecystectomy   . Tubal ligation     History  Substance Use Topics  . Smoking status: Never Smoker   . Smokeless tobacco: Not on file  . Alcohol Use: No    Review of Systems ROS: Statement: All systems negative except as marked or noted in the HPI; Constitutional: Negative for fever and chills. ; ; Eyes: Negative for eye pain, redness and discharge. ; ; ENMT: Negative for ear pain, hoarseness, nasal congestion, sinus pressure and sore throat. ; ; Cardiovascular: Negative for chest pain, palpitations, diaphoresis, dyspnea and peripheral edema. ; ; Respiratory: Negative for cough, wheezing and stridor. ; ; Gastrointestinal: Negative for nausea, vomiting, diarrhea, abdominal pain, blood in stool, hematemesis, jaundice and rectal bleeding. . ; ; Genitourinary: Negative for dysuria, flank pain and hematuria. ; ; Musculoskeletal: Negative for back pain and neck pain. Negative for swelling and trauma.; ; Skin: Negative  for pruritus, rash, abrasions, blisters, bruising and skin lesion.; ; Neuro: +headache. Negative for lightheadedness and neck stiffness. Negative for weakness, altered level of consciousness , altered mental status, extremity weakness, paresthesias, involuntary movement, seizure and syncope.     Allergies  Review of patient's allergies indicates no known allergies.  Home Medications   Current Outpatient Rx  Name Route Sig Dispense Refill  . CIPROFLOXACIN HCL 250 MG PO TABS Oral Take 250 mg by mouth.        BP 108/86  Pulse 79  Temp(Src) 98.1 F (36.7 C) (Oral)  Resp 16  Ht 5\' 5"  (1.651 m)  Wt 133 lb (60.328 kg)  BMI 22.13 kg/m2  SpO2 96%   Physical Exam 2035: Physical examination:  Nursing notes reviewed; Vital signs and O2 SAT reviewed;  Constitutional: Well developed, Well nourished, Well hydrated, In no acute distress; Head:  Normocephalic, atraumatic; Eyes: EOMI, PERRL, No scleral icterus; ENMT: TM's clear bilat.  Mouth and pharynx normal, Mucous membranes moist; Neck: Supple, Full range of motion, No lymphadenopathy, no meningeal signs. Cardiovascular: Regular rate and rhythm, No murmur, rub, or gallop; Respiratory: Breath sounds clear & equal bilaterally, No rales, rhonchi, wheezes, or rub, Normal respiratory effort/excursion; Chest: Nontender, Movement normal; Abdomen: Soft, Nontender, Nondistended, Normal bowel sounds; Genitourinary: No CVA tenderness; Extremities: Pulses normal, No tenderness, No edema, No calf edema or asymmetry.; Neuro: AA&Ox3, Major CN grossly intact. Strength 5/5 equal bilat UE's and LE's.  DTR 2/4 equal bilat UE's and  LE's.  No gross sensory deficits.  Speech clear.  No facial droop.  No nystagmus..; Skin: Color normal, Warm, Dry, no rash.    ED Course  Procedures  MDM  MDM Reviewed: previous chart, nursing note and vitals   Per pt's old records, SBP runs around 110.  Has long hx of headaches, has been out of verapamil because "it's on some kind of  national backorder."  Pt offered IVF and literature based headache meds IV.  States she does NOT want an IV and "just wants to get some shots so I can go home and rest."  Family here to drive pt home.  Will dose meds IM per pt's request.    10:18 PM:  Pt states she feels "much better now" and wants to go home.  NAD, AA&O, resps easy, neuro exam unchanged.  Will d/c home with family.     Medications given in eD:   ketorolac (TORADOL) 30 MG/ML injection 30 mg (30 mg Intramuscular Given 03/13/11 2130)  dexamethasone (DECADRON) tablet 10 mg (0 mg Oral Duplicate 03/13/11 2133)  diphenhydrAMINE (BENADRYL) injection 25 mg (25 mg Intramuscular Given 03/13/11 2129)  metoCLOPramide (REGLAN) injection 10 mg (10 mg Intramuscular Given 03/13/11 2128)      Camary Sosa M       Laray Anger, DO 03/15/11 0102

## 2011-03-13 NOTE — ED Notes (Signed)
Pt presents to the ED with a headache that she has had for the past 2 weeks. Pt states that she has had nausea/vomiting but denies today.

## 2011-03-13 NOTE — ED Notes (Signed)
Pt being seen for Headache this evening. Per pt dx with cluster ha's 2 years ago and has been on verapamil for ha and has been out for 2 weeks d/t money. Pt states when the headaches began is when she stopped taking medication. Denies any numbness,blurred vision or weakness.

## 2011-04-04 ENCOUNTER — Other Ambulatory Visit (HOSPITAL_COMMUNITY): Payer: Self-pay | Admitting: Physician Assistant

## 2011-04-04 DIAGNOSIS — Z1231 Encounter for screening mammogram for malignant neoplasm of breast: Secondary | ICD-10-CM

## 2011-04-06 ENCOUNTER — Ambulatory Visit (HOSPITAL_COMMUNITY)
Admission: RE | Admit: 2011-04-06 | Discharge: 2011-04-06 | Disposition: A | Discharge status: Home / Self Care | Payer: Self-pay | Source: Ambulatory Visit | Attending: Physician Assistant | Admitting: Physician Assistant

## 2011-04-06 ENCOUNTER — Other Ambulatory Visit: Payer: Self-pay | Admitting: Obstetrics and Gynecology

## 2011-04-06 DIAGNOSIS — Z1231 Encounter for screening mammogram for malignant neoplasm of breast: Secondary | ICD-10-CM

## 2011-05-10 ENCOUNTER — Other Ambulatory Visit: Payer: Self-pay | Admitting: Obstetrics and Gynecology

## 2011-05-10 DIAGNOSIS — G8929 Other chronic pain: Secondary | ICD-10-CM

## 2011-05-22 ENCOUNTER — Ambulatory Visit (HOSPITAL_COMMUNITY)
Admission: RE | Admit: 2011-05-22 | Discharge: 2011-05-22 | Disposition: A | Discharge status: Home / Self Care | Payer: Self-pay | Source: Ambulatory Visit | Attending: Obstetrics and Gynecology | Admitting: Obstetrics and Gynecology

## 2011-05-22 DIAGNOSIS — G8929 Other chronic pain: Secondary | ICD-10-CM

## 2011-05-22 DIAGNOSIS — R51 Headache: Secondary | ICD-10-CM | POA: Insufficient documentation

## 2011-08-07 ENCOUNTER — Encounter (HOSPITAL_COMMUNITY): Payer: Self-pay

## 2011-08-07 ENCOUNTER — Other Ambulatory Visit (HOSPITAL_COMMUNITY): Payer: Self-pay | Admitting: Physician Assistant

## 2011-08-07 ENCOUNTER — Ambulatory Visit (HOSPITAL_COMMUNITY)
Admission: RE | Admit: 2011-08-07 | Discharge: 2011-08-07 | Disposition: A | Discharge status: Home / Self Care | Payer: Self-pay | Source: Ambulatory Visit | Attending: Physician Assistant | Admitting: Physician Assistant

## 2011-08-07 DIAGNOSIS — R05 Cough: Secondary | ICD-10-CM | POA: Insufficient documentation

## 2011-08-07 DIAGNOSIS — R0602 Shortness of breath: Secondary | ICD-10-CM | POA: Insufficient documentation

## 2011-08-07 DIAGNOSIS — R059 Cough, unspecified: Secondary | ICD-10-CM | POA: Insufficient documentation

## 2011-08-07 HISTORY — DX: Other specified chronic obstructive pulmonary disease: J44.89

## 2011-08-07 HISTORY — DX: Chronic obstructive pulmonary disease, unspecified: J44.9

## 2011-11-08 ENCOUNTER — Emergency Department (HOSPITAL_COMMUNITY): Payer: Self-pay

## 2011-11-08 ENCOUNTER — Emergency Department (HOSPITAL_COMMUNITY)
Admission: EM | Admit: 2011-11-08 | Discharge: 2011-11-08 | Disposition: A | Discharge status: Home / Self Care | Payer: Self-pay | Attending: Emergency Medicine | Admitting: Emergency Medicine

## 2011-11-08 ENCOUNTER — Encounter (HOSPITAL_COMMUNITY): Payer: Self-pay | Admitting: *Deleted

## 2011-11-08 DIAGNOSIS — J4489 Other specified chronic obstructive pulmonary disease: Secondary | ICD-10-CM | POA: Insufficient documentation

## 2011-11-08 DIAGNOSIS — G43909 Migraine, unspecified, not intractable, without status migrainosus: Secondary | ICD-10-CM | POA: Insufficient documentation

## 2011-11-08 DIAGNOSIS — E785 Hyperlipidemia, unspecified: Secondary | ICD-10-CM | POA: Insufficient documentation

## 2011-11-08 DIAGNOSIS — J449 Chronic obstructive pulmonary disease, unspecified: Secondary | ICD-10-CM | POA: Insufficient documentation

## 2011-11-08 HISTORY — DX: Pneumothorax, unspecified: J93.9

## 2011-11-08 MED ORDER — MORPHINE SULFATE 4 MG/ML IJ SOLN
4.0000 mg | Freq: Once | INTRAMUSCULAR | Status: AC
  Administered 2011-11-08: 4 mg via INTRAVENOUS
  Filled 2011-11-08: qty 1

## 2011-11-08 MED ORDER — SODIUM CHLORIDE 0.9 % IV SOLN
INTRAVENOUS | Status: DC
  Administered 2011-11-08: 18:00:00 via INTRAVENOUS

## 2011-11-08 MED ORDER — SODIUM CHLORIDE 0.9 % IV BOLUS (SEPSIS)
1000.0000 mL | Freq: Once | INTRAVENOUS | Status: AC
  Administered 2011-11-08: 1000 mL via INTRAVENOUS

## 2011-11-08 MED ORDER — METOCLOPRAMIDE HCL 5 MG/ML IJ SOLN
10.0000 mg | Freq: Once | INTRAMUSCULAR | Status: AC
  Administered 2011-11-08: 10 mg via INTRAVENOUS
  Filled 2011-11-08: qty 2

## 2011-11-08 MED ORDER — DIPHENHYDRAMINE HCL 50 MG/ML IJ SOLN
25.0000 mg | Freq: Once | INTRAMUSCULAR | Status: AC
  Administered 2011-11-08: 17:00:00 via INTRAVENOUS
  Filled 2011-11-08: qty 1

## 2011-11-08 NOTE — ED Notes (Signed)
Pt states she feels a little bit better

## 2011-11-08 NOTE — ED Provider Notes (Signed)
History  This chart was scribed for Melissa Baker, MD by Ladona Ridgel Day. This patient was seen in room [facility] and the patient's care was started at 1621.  CSN: [id number]  Arrival date & time 11/08/11  1621   First MD Initiated Contact with Patient 11/08/11 1645      Chief Complaint  Patient presents with  . Headache    The history is provided by the patient. No language interpreter was used.   [name] is a 46 y.o. female who presents to the Emergency Department complaining of a severe migraine since this AM. She states that she was pain free yesterday but 2 days ago she had similar migraine symptoms. Pain was relieved with imitrex but then returned She states today she woke up with a severe  HA  where the pain is all over her head. Her associated symptoms are emesis, photophobia, and phonophobia. She tried taking medicine given to her by PCP but it has not given her any relief. She denies any fever, neck pain, confusion, and unilateral motor weakness. Location of headache similar to her prior migraines  Past Medical History  Diagnosis Date  . Rectal discharge 07/28/2010  . Sluggishness 07/28/2010  . Cluster headaches   . COPD (chronic obstructive pulmonary disease)   . Asthma   . Hyperlipemia   . COPD (chronic obstructive pulmonary disease) with chronic bronchitis   . Pneumothorax     Past Surgical History  Procedure Date  . Abdominal hysterectomy   . Lobectomy     right lung   . Cholecystectomy   . Tubal ligation     History reviewed. No pertinent family history.  History  Substance Use Topics  . Smoking status: Never Smoker   . Smokeless tobacco: Not on file  . Alcohol Use: No    OB History    Grav Para Term Preterm Abortions TAB SAB Ect Mult Living                  Review of Systems A complete 10 system review of systems was obtained and all systems are negative except as noted in the HPI and PMH.   Allergies  Penicillins  Home Medications    Current Outpatient Rx  Name Route Sig Dispense Refill  . ALBUTEROL SULFATE HFA 108 (90 BASE) MCG/ACT IN AERS Inhalation Inhale 2 puffs into the lungs every 4 (four) hours as needed. For shortness of breath    . BUTALBITAL-APAP-CAFFEINE 50-300-40 MG PO CAPS Oral Take 1 tablet by mouth as needed.    Marland Kitchen CALCIUM 600 + D PO Oral Take 1 tablet by mouth 2 (two) times daily.    Marland Kitchen CETIRIZINE HCL 10 MG PO TABS Oral Take 10 mg by mouth daily.    Marland Kitchen CITALOPRAM HYDROBROMIDE 20 MG PO TABS Oral Take 20 mg by mouth every evening.     Marland Kitchen ESOMEPRAZOLE MAGNESIUM 20 MG PO CPDR Oral Take 20 mg by mouth daily.    . FESOTERODINE FUMARATE ER 8 MG PO TB24 Oral Take 8 mg by mouth daily.    . MOMETASONE FURO-FORMOTEROL FUM 100-5 MCG/ACT IN AERO Inhalation Inhale 2 puffs into the lungs 2 (two) times daily.      Marland Kitchen PRAVASTATIN SODIUM 20 MG PO TABS Oral Take 20 mg by mouth every evening.    Marland Kitchen PROPRANOLOL HCL 40 MG PO TABS Oral Take 40 mg by mouth 2 (two) times daily.    . SUMATRIPTAN SUCCINATE 100 MG PO TABS Oral Take  50-100 mg by mouth every 2 (two) hours as needed. *Not to exceed two tablets within 24 hours**    . TRIAMCINOLONE ACETONIDE 0.1 % EX CREA Topical Apply 1 application topically 2 (two) times daily.      Triage Vitals: BP 105/62  Pulse 74  Temp 97.6 F (36.4 C) (Oral)  Resp 22  Ht 5\' 5"  (1.651 m)  Wt 135 lb (61.236 kg)  BMI 22.47 kg/m2  SpO2 100%  Physical Exam  Nursing note and vitals reviewed. Constitutional: She is oriented to person, place, and time. She appears well-developed and well-nourished. No distress.  HENT:  Head: Normocephalic and atraumatic.  Eyes: EOM are normal.  Neck: Normal range of motion. Neck supple. No tracheal deviation present.  Cardiovascular: Normal rate, regular rhythm and normal heart sounds.   Pulmonary/Chest: Effort normal. No respiratory distress.  Musculoskeletal: Normal range of motion.  Neurological: She is alert and oriented to person, place, and time.  Coordination normal.       No pronator drift.  Neuro grossly intact and normal.   Skin: Skin is warm and dry.  Psychiatric: She has a normal mood and affect. Her behavior is normal.    ED Course  Procedures (including critical care time) DIAGNOSTIC STUDIES: Oxygen Saturation is 100% on room air, normal by my interpretation.    COORDINATION OF CARE: At 454 PM Discussed treatment plan with patient which includes pain medicine, and head CT. Patient agrees.   Labs Reviewed - No data to display No results found.   No diagnosis found.    MDM   Pt given meds for migraine HA and now feels better, head ct neg--repeat neuro exam stable--suspect rebound HA and doubt SAH--stable for d/c I personally performed the services described in this documentation, which was scribed in my presence. The recorded information has been reviewed and considered.          Melissa Baker, MD 11/08/11 [id number]

## 2011-11-08 NOTE — ED Notes (Signed)
Headache, NV alert, talking  No HI

## 2012-01-31 ENCOUNTER — Emergency Department (HOSPITAL_COMMUNITY)
Admission: EM | Admit: 2012-01-31 | Discharge: 2012-01-31 | Disposition: A | Discharge status: Home / Self Care | Payer: Self-pay | Attending: Emergency Medicine | Admitting: Emergency Medicine

## 2012-01-31 ENCOUNTER — Encounter (HOSPITAL_COMMUNITY): Payer: Self-pay | Admitting: Emergency Medicine

## 2012-01-31 DIAGNOSIS — J449 Chronic obstructive pulmonary disease, unspecified: Secondary | ICD-10-CM | POA: Insufficient documentation

## 2012-01-31 DIAGNOSIS — Z90710 Acquired absence of both cervix and uterus: Secondary | ICD-10-CM | POA: Insufficient documentation

## 2012-01-31 DIAGNOSIS — Z9889 Other specified postprocedural states: Secondary | ICD-10-CM | POA: Insufficient documentation

## 2012-01-31 DIAGNOSIS — J4489 Other specified chronic obstructive pulmonary disease: Secondary | ICD-10-CM | POA: Insufficient documentation

## 2012-01-31 DIAGNOSIS — Z8679 Personal history of other diseases of the circulatory system: Secondary | ICD-10-CM | POA: Insufficient documentation

## 2012-01-31 DIAGNOSIS — J45909 Unspecified asthma, uncomplicated: Secondary | ICD-10-CM | POA: Insufficient documentation

## 2012-01-31 DIAGNOSIS — Z79899 Other long term (current) drug therapy: Secondary | ICD-10-CM | POA: Insufficient documentation

## 2012-01-31 DIAGNOSIS — Z8709 Personal history of other diseases of the respiratory system: Secondary | ICD-10-CM | POA: Insufficient documentation

## 2012-01-31 DIAGNOSIS — Z7983 Long term (current) use of bisphosphonates: Secondary | ICD-10-CM | POA: Insufficient documentation

## 2012-01-31 DIAGNOSIS — G43909 Migraine, unspecified, not intractable, without status migrainosus: Secondary | ICD-10-CM | POA: Insufficient documentation

## 2012-01-31 DIAGNOSIS — Z9089 Acquired absence of other organs: Secondary | ICD-10-CM | POA: Insufficient documentation

## 2012-01-31 DIAGNOSIS — E785 Hyperlipidemia, unspecified: Secondary | ICD-10-CM | POA: Insufficient documentation

## 2012-01-31 MED ORDER — METHYLPREDNISOLONE SODIUM SUCC 125 MG IJ SOLR
125.0000 mg | Freq: Once | INTRAMUSCULAR | Status: AC
  Administered 2012-01-31: 125 mg via INTRAVENOUS
  Filled 2012-01-31: qty 2

## 2012-01-31 MED ORDER — MORPHINE SULFATE 4 MG/ML IJ SOLN
4.0000 mg | Freq: Once | INTRAMUSCULAR | Status: AC
  Administered 2012-01-31: 13:00:00 via INTRAVENOUS
  Filled 2012-01-31: qty 1

## 2012-01-31 MED ORDER — MORPHINE SULFATE 4 MG/ML IJ SOLN
INTRAMUSCULAR | Status: AC
  Filled 2012-01-31: qty 1

## 2012-01-31 MED ORDER — METOCLOPRAMIDE HCL 5 MG/ML IJ SOLN
10.0000 mg | Freq: Once | INTRAMUSCULAR | Status: AC
  Administered 2012-01-31: 10 mg via INTRAVENOUS
  Filled 2012-01-31: qty 2

## 2012-01-31 MED ORDER — SODIUM CHLORIDE 0.9 % IV BOLUS (SEPSIS)
1000.0000 mL | Freq: Once | INTRAVENOUS | Status: AC
  Administered 2012-01-31: 1000 mL via INTRAVENOUS

## 2012-01-31 MED ORDER — DIPHENHYDRAMINE HCL 50 MG/ML IJ SOLN
25.0000 mg | Freq: Once | INTRAMUSCULAR | Status: AC
  Administered 2012-01-31: 25 mg via INTRAVENOUS
  Filled 2012-01-31: qty 1

## 2012-01-31 NOTE — ED Notes (Signed)
Pt states she has had a migraine for 2 weeks now.

## 2012-01-31 NOTE — ED Provider Notes (Signed)
History     CSN: [id number]  Arrival date & time 01/31/12  1308   First MD Initiated Contact with Patient 01/31/12 1026      Chief Complaint  Patient presents with  . Migraine    (Consider location/radiation/quality/duration/timing/severity/associated sxs/prior treatment) HPI Comments: [name]  presents with migraine headache which started 2 weeks ago.  The patient has a history of migraine and the current symptoms are similar to previous episodes of migraine headache.  The patients symptoms were not preceded by prodromal symptoms.  The patient has right sided  Forehead and temporal pain in association with nausea, vomiting, photophobia and phonophobia.  She reports 4 episodes of vomiting this morning.  She has been unable to maintain by mouth intake just today although she reports decreased intake over the past several days.  There has been no fevers, chills, syncope, confusion or localized weakness.  The patient tried imitrex without relief of symptoms.  The history is provided by the patient.    Past Medical History  Diagnosis Date  . Rectal discharge 07/28/2010  . Sluggishness 07/28/2010  . Cluster headaches   . COPD (chronic obstructive pulmonary disease)   . Asthma   . Hyperlipemia   . COPD (chronic obstructive pulmonary disease) with chronic bronchitis   . Pneumothorax     Past Surgical History  Procedure Date  . Abdominal hysterectomy   . Lobectomy     right lung   . Cholecystectomy   . Tubal ligation     No family history on file.  History  Substance Use Topics  . Smoking status: Never Smoker   . Smokeless tobacco: Not on file  . Alcohol Use: No    OB History    Grav Para Term Preterm Abortions TAB SAB Ect Mult Living                  Review of Systems  Constitutional: Negative for fever and chills.  HENT: Negative for congestion, sore throat, facial swelling, neck pain and neck stiffness.   Eyes: Positive for photophobia.    Respiratory: Negative for chest tightness and shortness of breath.   Cardiovascular: Negative for chest pain.  Gastrointestinal: Positive for nausea and vomiting. Negative for abdominal pain.  Genitourinary: Negative.   Musculoskeletal: Negative for joint swelling and arthralgias.  Skin: Negative.  Negative for rash and wound.  Neurological: Positive for headaches. Negative for dizziness, facial asymmetry, speech difficulty, weakness, light-headedness and numbness.  Hematological: Negative.   Psychiatric/Behavioral: Negative.     Allergies  Penicillins  Home Medications   Current Outpatient Rx  Name Route Sig Dispense Refill  . ALBUTEROL SULFATE HFA 108 (90 BASE) MCG/ACT IN AERS Inhalation Inhale 2 puffs into the lungs every 4 (four) hours as needed. For shortness of breath    . ASPIRIN-ACETAMINOPHEN-CAFFEINE 250-250-65 MG PO TABS Oral Take 1 tablet by mouth every 6 (six) hours as needed. Headache.    Marland Kitchen CALCIUM 600 + D PO Oral Take 1 tablet by mouth 2 (two) times daily.    Marland Kitchen CETIRIZINE HCL 10 MG PO TABS Oral Take 10 mg by mouth daily.    Marland Kitchen CITALOPRAM HYDROBROMIDE 20 MG PO TABS Oral Take 20 mg by mouth every evening.     Marland Kitchen ESOMEPRAZOLE MAGNESIUM 20 MG PO CPDR Oral Take 20 mg by mouth daily.    Marland Kitchen ESTRADIOL 1 MG PO TABS Oral Take 1 mg by mouth daily.    . FESOTERODINE FUMARATE ER 8 MG PO TB24 Oral  Take 8 mg by mouth daily.    . MOMETASONE FURO-FORMOTEROL FUM 100-5 MCG/ACT IN AERO Inhalation Inhale 2 puffs into the lungs 2 (two) times daily.      Marland Kitchen PRAVASTATIN SODIUM 20 MG PO TABS Oral Take 20 mg by mouth every evening.    Marland Kitchen PROPRANOLOL HCL 40 MG PO TABS Oral Take 40 mg by mouth 2 (two) times daily.    . SUMATRIPTAN SUCCINATE 100 MG PO TABS Oral Take 50-100 mg by mouth every 2 (two) hours as needed. *Not to exceed two tablets within 24 hours**    . TRIAMCINOLONE ACETONIDE 0.1 % EX CREA Topical Apply 1 application topically 2 (two) times daily as needed. Rash.      BP 113/77  Pulse 67   Temp 97.7 F (36.5 C) (Oral)  Resp 18  Wt 138 lb (62.596 kg)  SpO2 100%  Physical Exam  Nursing note and vitals reviewed. Constitutional: She is oriented to person, place, and time. She appears well-developed and well-nourished.       Uncomfortable appearing  HENT:  Head: Normocephalic and atraumatic.  Mouth/Throat: Oropharynx is clear and moist.  Eyes: EOM are normal. Pupils are equal, round, and reactive to light.  Neck: Normal range of motion. Neck supple.  Cardiovascular: Normal rate and normal heart sounds.   Pulmonary/Chest: Effort normal.  Abdominal: Soft. There is no tenderness.  Musculoskeletal: Normal range of motion.  Lymphadenopathy:    She has no cervical adenopathy.  Neurological: She is alert and oriented to person, place, and time. She has normal strength. No sensory deficit. Gait normal. GCS eye subscore is 4. GCS verbal subscore is 5. GCS motor subscore is 6.       Normal heel-shin, normal rapid alternating movements. Cranial nerves III-XII intact.  No pronator drift.  Skin: Skin is warm and dry. No rash noted.  Psychiatric: She has a normal mood and affect. Her speech is normal and behavior is normal. Thought content normal. Cognition and memory are normal.    ED Course  Procedures (including critical care time)  Labs Reviewed - No data to display No results found.   1. Migraine     Patient given normal saline IV fluids 2 L, also given migraine cocktail including Benadryl, Reglan and Solu-Medrol with reduction of headache to 5/10 from 10 out of 10.  She was able to tolerate by mouth fluids while in the department.  She was given morphine 4 mg IV with continuing improvement in headache symptoms.  MDM  Patient with migraine headache with current symptoms very suggestive of prior migraine episodes.  She has no focal neurologic deficit.  She has been afebrile and has no exam findings suggestive of infectious process such as meningitis.  Patient encouraged to  go home and rest, she is accompanied by son who will drive her home.  Encouraged recheck by PCP if not better by tomorrow.        Burgess Amor, [place] 01/31/12 1221

## 2012-02-01 NOTE — ED Provider Notes (Signed)
Medical screening examination/treatment/procedure(s) were performed by non-physician practitioner and as supervising physician I was immediately available for consultation/collaboration.   Carleene Cooper III, MD 02/01/12 [phone]

## 2012-02-21 ENCOUNTER — Telehealth: Payer: Self-pay | Admitting: Neurology

## 2012-02-21 ENCOUNTER — Encounter: Payer: Self-pay | Admitting: Neurology

## 2012-02-21 ENCOUNTER — Ambulatory Visit: Payer: Self-pay | Admitting: Neurology

## 2012-02-21 ENCOUNTER — Ambulatory Visit (INDEPENDENT_AMBULATORY_CARE_PROVIDER_SITE_OTHER): Payer: Self-pay | Admitting: Neurology

## 2012-02-21 VITALS — BP 98/60 | HR 80 | Temp 98.1°F | Resp 16 | Ht 65.0 in | Wt 129.0 lb

## 2012-02-21 DIAGNOSIS — G479 Sleep disorder, unspecified: Secondary | ICD-10-CM

## 2012-02-21 DIAGNOSIS — R5381 Other malaise: Secondary | ICD-10-CM

## 2012-02-21 DIAGNOSIS — G43819 Other migraine, intractable, without status migrainosus: Secondary | ICD-10-CM

## 2012-02-21 DIAGNOSIS — G4723 Circadian rhythm sleep disorder, irregular sleep wake type: Secondary | ICD-10-CM

## 2012-02-21 DIAGNOSIS — G4701 Insomnia due to medical condition: Secondary | ICD-10-CM

## 2012-02-21 DIAGNOSIS — R5383 Other fatigue: Secondary | ICD-10-CM

## 2012-02-21 DIAGNOSIS — G43119 Migraine with aura, intractable, without status migrainosus: Secondary | ICD-10-CM

## 2012-02-21 MED ORDER — VERAPAMIL HCL 80 MG PO TABS: 80.0000 mg | ORAL_TABLET | Freq: Three times a day (TID) | ORAL | Status: DC

## 2012-02-21 MED ORDER — PREDNISONE 20 MG PO TABS: 20.0000 mg | ORAL_TABLET | Freq: Three times a day (TID) | ORAL | Status: AC

## 2012-02-21 MED ORDER — ZOLPIDEM TARTRATE ER 12.5 MG PO TBCR: 12.5000 mg | EXTENDED_RELEASE_TABLET | Freq: Every evening | ORAL | Status: DC | PRN

## 2012-02-21 MED ORDER — HYDROCODONE-ACETAMINOPHEN 5-500 MG PO CAPS: 2.0000 | ORAL_CAPSULE | Freq: Four times a day (QID) | ORAL | Status: DC | PRN

## 2012-02-21 NOTE — Progress Notes (Signed)
Teenage onset of throbbing headaches then later associated with nausea and more lately light sensitivity.  Starts on left side and then spreads to both sides when full blown.  Can build quickly, ranging from 5 to 30 minutes. Initially monthly and now for 8 months she has more days with a headache than without.  Mother and MGM had severe nigraines.  Her mother his migraines have slowed down significantly since the onset of menopause. The patient's husband feels that her headaches are largely due to stress and worry. Her headaches have become very severe and do affect her ability to work and do normal functions. She has had to visit the ER for the headaches. The cocktail will relieve the headache for about 2 days, but the headache and returned just as strong as it was before. Maybe 8 years ago, she had episodes of frequent headaches associated with disturbances of the right side of the body. She was referred to a neurologist to rule out TIAs. The neurologist diagnosed her with complex migraines and she responded very well to verapamil for a couple of years. The verapamil seemed to stop working and she was then switched to propranolol which is not helping at this time. She also has significant sleep disturbance. She has trouble falling asleep and staying asleep. Sometimes the Phenergan will help her sleep. She is now working and her husband is also working, but during her session they both had financial difficulties and work difficulties. She took hormones after her hysterectomy 8 years ago but then stopped because of concern for risk of breast cancer. She be started hormones several months ago but is going to taper off of these. The hormones do not seem to have worsened her headaches. She is also just started Synthroid medication for a low thyroid function result. She is fatigued all the time and doesn't feel good and her memory is very poor. She also gets some pains in her shoulders worse on the right side and also  sometimes in the knees and these come and go.  As far as the headache, she tends to wake up with a headache and go to bed with a headache. She is taking Excedrin 6-8 pills a day just to keep the headache down to a 4 or 5/10. The Imitrex worked on one occasion in [employer] work once or twice but she had to take 3 tablets to get full results. She is tried to discontinue chocolate and reduce her coffee. She also drinks caffeine free soda [place].  Current Outpatient Prescriptions on File Prior to Visit  Medication Sig Dispense Refill  . albuterol (VENTOLIN HFA) 108 (90 BASE) MCG/ACT inhaler Inhale 2 puffs into the lungs every 4 (four) hours as needed. For shortness of breath      . aspirin-acetaminophen-caffeine (EXCEDRIN MIGRAINE) 250-250-65 MG per tablet Take 1 tablet by mouth every 6 (six) hours as needed. Headache.      . Calcium Carbonate-Vitamin D (CALCIUM 600 + D PO) Take 1 tablet by mouth 2 (two) times daily.      . cetirizine (ZYRTEC) 10 MG tablet Take 10 mg by mouth daily.      . citalopram (CELEXA) 20 MG tablet Take 20 mg by mouth every evening.       Marland Kitchen estradiol (ESTRACE) 1 MG tablet Take 1 mg by mouth daily.      . fesoterodine (TOVIAZ) 8 MG TB24 Take 8 mg by mouth daily.      . mometasone-formoterol (DULERA) 100-5 MCG/ACT AERO  Inhale 2 puffs into the lungs 2 (two) times daily.        . pravastatin (PRAVACHOL) 20 MG tablet Take 20 mg by mouth every evening.      . propranolol (INDERAL) 40 MG tablet Take 40 mg by mouth 2 (two) times daily.      . SUMAtriptan (IMITREX) 100 MG tablet Take 50-100 mg by mouth every 2 (two) hours as needed. *Not to exceed two tablets within 24 hours**      . triamcinolone cream (KENALOG) 0.1 % Apply 1 application topically 2 (two) times daily as needed. Rash.      . esomeprazole (NEXIUM) 20 MG capsule Take 20 mg by mouth daily.      . verapamil (CALAN) 80 MG tablet Take 1 tablet (80 mg total) by mouth 3 (three) times daily.  90 tablet  5  . zolpidem (AMBIEN CR)  12.5 MG CR tablet Take 1 tablet (12.5 mg total) by mouth at bedtime as needed for sleep.  20 tablet  1   H  The patient currently has a 4 or 5/10 left temporal headache. She has photosensitivity, particularly to the fluorescent lighting. Her cranial nerve exam is unremarkable. Strength is 5 over 5 throughout. Reflexes are 3+ and symmetric. Sensation is grossly intact. Gait and station are normal. No carotid bruits are detected. Heart rate is regular.  When checking the 5 element pulses, there is excessive activity in the liver position.  Impression:  This 46 year old peri-menopausal woman has a history of migraines since her teen years that have transformed into chronic daily headache syndrome in the past several years. This is associated with insomnia fatigue depression and memory loss. She has had partial responsiveness to trip and medications. She has had suboptimal response to propranolol. This can also exacerbate depression and sleep issues and some individuals. At one time she did respond to verapamil.  Plan:  Try discontinuing propranolol Begin verapamil 80 mg to 23 times a day Begin 15 day taper of prednisone Trial of Ambien 12.5 CR up to 4 times a week as needed for sleep Continue to avoid migraine producing foods. Return here in 3 months to reassess and adjust medications. In the meantime, she can continue to advance it helpful. As a rescue medicine, she can also use hydrocodone 5-325 one or 2 tablets every 6 hours if needed for a refractory headache.

## 2012-02-21 NOTE — Patient Instructions (Signed)
1.  Discontinue propranalol 2.  Begin verapamil 80 mg twice a day.  Can increase to 3 times a day in 1 week if needed. 3.  Take prednisone 20 mg 3 a day for 5 days, then 2 a day for 5 days, then 1 a day dor 5 days. 4.  Try to decrease or stop excedrin. 5.  Use ambien 12.5 cr at night if needed up to 4 times a week. 6.  RTC in 3 weeks.

## 2012-02-21 NOTE — Telephone Encounter (Signed)
Patient called stating that upon picking the ambien cr it was too expensive and patient states that the pharmacy stated that the reg. Remus Loffler is cheaper. Patient then states she would like to have ativan instead of the [place]. Please advise.

## 2012-02-22 NOTE — Telephone Encounter (Signed)
**  Dr. Smiley Houseman, please advise re: patient's request for Ativan vs Ambien. Thanks. Jan

## 2012-02-26 ENCOUNTER — Other Ambulatory Visit (HOSPITAL_COMMUNITY): Payer: Self-pay | Admitting: Physician Assistant

## 2012-02-26 DIAGNOSIS — Z139 Encounter for screening, unspecified: Secondary | ICD-10-CM

## 2012-02-27 NOTE — Telephone Encounter (Signed)
Regular ambien 10 mg is fine, generic is fine.  When i prescribed, regular Remus Loffler was not in the epic options so i chose [place] cr, the available option.  Generic ambien CR would also be fine.  We can e prescribe or fax in [place] 10mg  hs prn  #20 with 1 refill in place of ambien CR due to expense issues raised by patient.

## 2012-02-27 NOTE — Telephone Encounter (Signed)
Called and spoke with the patient. Informed that regular Ambien called to her pharmacy. Still c/o of HA. Per her patient instructions from her last office visit with Dr. Smiley Houseman, she may increase the Verapamil in a week to TID. I asked her if she did that to let us know in a few days if it was helping and also so we could change her prescription. Still on the Prednisone taper. Follow up appointment scheduled for her in December. No other concerns at this time.

## 2012-03-19 ENCOUNTER — Ambulatory Visit: Payer: Self-pay | Admitting: Neurology

## 2012-03-21 ENCOUNTER — Ambulatory Visit (INDEPENDENT_AMBULATORY_CARE_PROVIDER_SITE_OTHER): Payer: Self-pay | Admitting: Neurology

## 2012-03-21 ENCOUNTER — Encounter: Payer: Self-pay | Admitting: Neurology

## 2012-03-21 VITALS — BP 90/60 | HR 88 | Temp 98.0°F | Resp 16 | Ht 65.0 in | Wt 128.0 lb

## 2012-03-21 DIAGNOSIS — G4700 Insomnia, unspecified: Secondary | ICD-10-CM | POA: Insufficient documentation

## 2012-03-21 DIAGNOSIS — G43109 Migraine with aura, not intractable, without status migrainosus: Secondary | ICD-10-CM

## 2012-03-21 DIAGNOSIS — G43809 Other migraine, not intractable, without status migrainosus: Secondary | ICD-10-CM

## 2012-03-21 DIAGNOSIS — R350 Frequency of micturition: Secondary | ICD-10-CM

## 2012-03-21 MED ORDER — LORAZEPAM 0.5 MG PO TABS: 0.5000 mg | ORAL_TABLET | Freq: Every evening | ORAL | Status: DC | PRN

## 2012-03-21 NOTE — Progress Notes (Signed)
[name] is doing much much better.  She has been able to DC excedrin altogether.  She is worrying less and walking with her husband more.  She did not tolerate verapamil due to nausea.  She did not tolerate ambien 10 due to hangover effect.  She takes 1/2 of a 0.5 or sometimes a whole tablet at night if needed for sleep about 3 times a week.  She stopped having daily headaches and the last migraine she got yesterday she attributes to the stress of driving through [place] for her vet appt. This responded to Immitrex 100mg  with no side effects.  That was the first migraine in 2 weeks and this is the best she has felt in her adult years.  Past Medical History  Diagnosis Date  . Rectal discharge 07/28/2010  . Sluggishness 07/28/2010  . Cluster headaches   . COPD (chronic obstructive pulmonary disease)   . Asthma   . Hyperlipemia   . COPD (chronic obstructive pulmonary disease) with chronic bronchitis   . Pneumothorax     Meds: Toviaz 8mg  Immitrex 100 po prn Ativan 0.25 to 0.5 hs prn sleep Zyrtec celexa 20 qd  Stopped excedrin, inderal and verapamil and did not tolerate zolpidem 10 for hangover effect.  Penicillins   History   Social History  . Marital Status: Married    Spouse Name: N/A    Number of Children: N/A  . Years of Education: N/A   Occupational History  . Not on file.   Social History Main Topics  . Smoking status: Former Smoker    Quit date: 02/21/2007  . Smokeless tobacco: Never Used  . Alcohol Use: No     Comment: no  . Drug Use: No  . Sexually Active: Yes    Birth Control/ Protection: Surgical   Other Topics Concern  . Not on file   Social History Narrative  . No narrative on file    fam hx: Migraines in her mother  ROS  Negative excepot for ongoing bladder issues and infrequent migraines and sleep disturbance.  Exam Alert and oriented x 3.  Memory function appears to be intact.  Concentration and attention are normal for educational level and  background.  Speech is fluent and without significant word finding difficulty.  Is aware of current events.  No carotid bruits detected.  Cranial nerve II through XII are within normal limits.  This includes normal optic discs and acuity, EOMI, PERLA, facial movement and sensation intact, hearing grossly intact, gag intact,Uvula raises symmetrically and tongue protrudes evenly. Motor strength is 5 over 5 throughout all limbs, no atrophy, abnormal tone or abnormal movements Reflexes are 2+ and symmetric in the upper and lower extremities Sensory exam is intact. Coordination is intact fo rfine movements and rapid alternating movements in all limbs  Assessment:  Chronic migraine disorder and anxiety issues and an element of analgesic rebound syndrome.  Much improved with discontinuation of excedrin and she continues celexa 20 and immitrex prn and some healthier lifestyle activities.  Plan: Continue immitrex 100 prn onset of migraine 2. Continue exercise pattern 3.  OK to use ativan up to 3 times a week for insomnia. 4.  RTC 3 months.  Cal if migraine pattern begins to worsen for advice. Gait and station are normal.

## 2012-03-21 NOTE — Patient Instructions (Addendum)
1. Keep up the good work with exercise and etc. 2.  Continue immitrex 100 by mouth at first sign of migraine, as needed. 3.  If migraines start getting more frequent, let me know and we can consider a different prevantive. 4.  Return in 3 months.

## 2012-04-07 ENCOUNTER — Ambulatory Visit (HOSPITAL_COMMUNITY): Payer: Self-pay

## 2012-04-10 ENCOUNTER — Ambulatory Visit (HOSPITAL_COMMUNITY)
Admission: RE | Admit: 2012-04-10 | Discharge: 2012-04-10 | Disposition: A | Discharge status: Home / Self Care | Payer: Self-pay | Source: Ambulatory Visit | Attending: Physician Assistant | Admitting: Physician Assistant

## 2012-04-10 DIAGNOSIS — Z139 Encounter for screening, unspecified: Secondary | ICD-10-CM | POA: Insufficient documentation

## 2012-07-28 ENCOUNTER — Emergency Department (HOSPITAL_COMMUNITY)
Admission: EM | Admit: 2012-07-28 | Discharge: 2012-07-28 | Disposition: A | Discharge status: Home / Self Care | Payer: Self-pay | Attending: Emergency Medicine | Admitting: Emergency Medicine

## 2012-07-28 ENCOUNTER — Encounter (HOSPITAL_COMMUNITY): Payer: Self-pay | Admitting: *Deleted

## 2012-07-28 ENCOUNTER — Encounter: Payer: Self-pay | Admitting: Emergency Medicine

## 2012-07-28 DIAGNOSIS — R110 Nausea: Secondary | ICD-10-CM | POA: Insufficient documentation

## 2012-07-28 DIAGNOSIS — Y9389 Activity, other specified: Secondary | ICD-10-CM | POA: Insufficient documentation

## 2012-07-28 DIAGNOSIS — Z8679 Personal history of other diseases of the circulatory system: Secondary | ICD-10-CM | POA: Insufficient documentation

## 2012-07-28 DIAGNOSIS — Z8709 Personal history of other diseases of the respiratory system: Secondary | ICD-10-CM | POA: Insufficient documentation

## 2012-07-28 DIAGNOSIS — E785 Hyperlipidemia, unspecified: Secondary | ICD-10-CM | POA: Insufficient documentation

## 2012-07-28 DIAGNOSIS — Y9289 Other specified places as the place of occurrence of the external cause: Secondary | ICD-10-CM | POA: Insufficient documentation

## 2012-07-28 DIAGNOSIS — J449 Chronic obstructive pulmonary disease, unspecified: Secondary | ICD-10-CM | POA: Insufficient documentation

## 2012-07-28 DIAGNOSIS — H53149 Visual discomfort, unspecified: Secondary | ICD-10-CM | POA: Insufficient documentation

## 2012-07-28 DIAGNOSIS — Z79899 Other long term (current) drug therapy: Secondary | ICD-10-CM | POA: Insufficient documentation

## 2012-07-28 DIAGNOSIS — Z87891 Personal history of nicotine dependence: Secondary | ICD-10-CM | POA: Insufficient documentation

## 2012-07-28 DIAGNOSIS — R42 Dizziness and giddiness: Secondary | ICD-10-CM | POA: Insufficient documentation

## 2012-07-28 DIAGNOSIS — IMO0002 Reserved for concepts with insufficient information to code with codable children: Secondary | ICD-10-CM | POA: Insufficient documentation

## 2012-07-28 DIAGNOSIS — Z880 Allergy status to penicillin: Secondary | ICD-10-CM | POA: Insufficient documentation

## 2012-07-28 DIAGNOSIS — J4489 Other specified chronic obstructive pulmonary disease: Secondary | ICD-10-CM | POA: Insufficient documentation

## 2012-07-28 DIAGNOSIS — X58XXXA Exposure to other specified factors, initial encounter: Secondary | ICD-10-CM | POA: Insufficient documentation

## 2012-07-28 DIAGNOSIS — S09301A Unspecified injury of right middle and inner ear, initial encounter: Secondary | ICD-10-CM

## 2012-07-28 DIAGNOSIS — S0993XA Unspecified injury of face, initial encounter: Secondary | ICD-10-CM | POA: Insufficient documentation

## 2012-07-28 MED ORDER — KETOROLAC TROMETHAMINE 30 MG/ML IJ SOLN
INTRAMUSCULAR | Status: AC
  Filled 2012-07-28: qty 1

## 2012-07-28 MED ORDER — ONDANSETRON 4 MG PO TBDP: ORAL_TABLET | ORAL | Status: DC

## 2012-07-28 MED ORDER — DIPHENHYDRAMINE HCL 50 MG/ML IJ SOLN
INTRAMUSCULAR | Status: AC
  Filled 2012-07-28: qty 1

## 2012-07-28 MED ORDER — ONDANSETRON HCL 4 MG/2ML IJ SOLN
4.0000 mg | Freq: Once | INTRAMUSCULAR | Status: AC
  Administered 2012-07-28: 4 mg via INTRAVENOUS
  Filled 2012-07-28: qty 2

## 2012-07-28 MED ORDER — METOCLOPRAMIDE HCL 5 MG/ML IJ SOLN
INTRAMUSCULAR | Status: AC
  Filled 2012-07-28: qty 2

## 2012-07-28 MED ORDER — ONDANSETRON 8 MG PO TBDP
ORAL_TABLET | ORAL | Status: AC
  Administered 2012-07-28: 8 mg via ORAL
  Filled 2012-07-28: qty 1

## 2012-07-28 MED ORDER — HYDROCODONE-ACETAMINOPHEN 5-325 MG PO TABS: 1.0000 | ORAL_TABLET | ORAL | Status: DC | PRN

## 2012-07-28 MED ORDER — MORPHINE SULFATE 4 MG/ML IJ SOLN
INTRAMUSCULAR | Status: AC
  Administered 2012-07-28: 4 mg via INTRAVENOUS
  Filled 2012-07-28: qty 1

## 2012-07-28 MED ORDER — MORPHINE SULFATE 4 MG/ML IJ SOLN
4.0000 mg | Freq: Once | INTRAMUSCULAR | Status: AC
  Administered 2012-07-28: 4 mg via INTRAVENOUS

## 2012-07-28 MED ORDER — SODIUM CHLORIDE 0.9 % IV SOLN: INTRAVENOUS | Status: DC

## 2012-07-28 MED ORDER — MECLIZINE HCL 25 MG PO TABS: 25.0000 mg | ORAL_TABLET | Freq: Three times a day (TID) | ORAL | Status: DC | PRN

## 2012-07-28 MED ORDER — ONDANSETRON 8 MG PO TBDP
8.0000 mg | ORAL_TABLET | Freq: Once | ORAL | Status: AC
  Administered 2012-07-28: 8 mg via ORAL

## 2012-07-28 MED ORDER — MECLIZINE HCL 12.5 MG PO TABS
25.0000 mg | ORAL_TABLET | Freq: Once | ORAL | Status: AC
  Administered 2012-07-28: 25 mg via ORAL
  Filled 2012-07-28: qty 2

## 2012-07-28 MED ORDER — KETOROLAC TROMETHAMINE 30 MG/ML IJ SOLN: 30.0000 mg | Freq: Once | INTRAMUSCULAR | Status: DC

## 2012-07-28 MED ORDER — SODIUM CHLORIDE 0.9 % IV BOLUS (SEPSIS)
1000.0000 mL | Freq: Once | INTRAVENOUS | Status: AC
  Administered 2012-07-28: 1000 mL via INTRAVENOUS

## 2012-07-28 MED ORDER — DIPHENHYDRAMINE HCL 50 MG/ML IJ SOLN: 25.0000 mg | Freq: Once | INTRAMUSCULAR | Status: DC

## 2012-07-28 MED ORDER — METOCLOPRAMIDE HCL 5 MG/ML IJ SOLN: 10.0000 mg | Freq: Once | INTRAMUSCULAR | Status: DC

## 2012-07-28 NOTE — ED Notes (Signed)
Patient states she awoke from sleep this A.M. W/sharp pain in R ear, "felt a pop".  Pain has escalated while at work to point she was dizzy, nauseated and vomited x1. States it was beginning to affect her balance.  Quality of pain is not similar to her migraines.  NVS intact.

## 2012-07-28 NOTE — ED Notes (Signed)
Patient with no complaints at this time. Respirations even and unlabored. Skin warm/dry. Discharge instructions reviewed with patient at this time. Patient given opportunity to voice concerns/ask questions. IV removed per policy and band-aid applied to site. Patient discharged at this time and left Emergency Department with steady gait.  

## 2012-07-28 NOTE — ED Provider Notes (Signed)
History  This chart was scribed for [name], MD by Shari Heritage, ED Scribe. The patient was seen in room APA11/AP[facility]11. Patient's care was started at 1309.   CSN: [id number]  Arrival date & time 07/28/12  1308   First MD Initiated Contact with Patient 07/28/12 1309      Chief Complaint  Patient presents with  . Otalgia    Patient is a 47 y.o. female presenting with ear pain. The history is provided by the patient. No language interpreter was used.  Otalgia Location:  Right Quality: stabbing. Severity:  Severe Onset quality:  Sudden Timing:  Constant Progression:  Unchanged Chronicity:  New Ineffective treatments:  None tried Associated symptoms: no abdominal pain, no cough, no ear discharge, no fever, no headaches, no hearing loss, no neck pain, no rash, no tinnitus and no vomiting      HPI Comments: [name] is a 47 y.o. female who presents to the Emergency Department complaining of stabbing, constant, non-radiating, severe right ear pain onset this morning upon waking. There is associated nausea and photophobia. She states that earlier today she had dizziness and loss of balance with position changes. Patient also reports changes in hearing on the right and a foreign body sensation. Patient states that she frequently cleans her ears with Q-tips. Patient denies ear drainage, vomiting, chest pain, shortness of breath or any other symptoms. Patient states that she had a migraine that began 1 week ago, but it was resolved before current symptoms began. She states that she does not usually have pain around the ear with migraines, and current symptoms are not consistent with past migraine headaches. Patient has a medical history of COPD, asthma, and hyperlipemia. Patient is a former smoker. She does not use alcohol.    Past Medical History  Diagnosis Date  . Rectal discharge 07/28/2010  . Sluggishness 07/28/2010  . Cluster headaches   . COPD (chronic obstructive  pulmonary disease)   . Asthma   . Hyperlipemia   . COPD (chronic obstructive pulmonary disease) with chronic bronchitis   . Pneumothorax     Past Surgical History  Procedure Laterality Date  . Abdominal hysterectomy    . Lobectomy      right lung   . Cholecystectomy    . Tubal ligation      No family history on file.  History  Substance Use Topics  . Smoking status: Former Smoker    Quit date: 02/21/2007  . Smokeless tobacco: Never Used  . Alcohol Use: No     Comment: no    OB History   Grav Para Term Preterm Abortions TAB SAB Ect Mult Living                  Review of Systems  Constitutional: Negative for fever and diaphoresis.  HENT: Positive for ear pain. Negative for hearing loss, neck pain, tinnitus and ear discharge.   Eyes: Positive for photophobia.  Respiratory: Negative for cough and shortness of breath.   Cardiovascular: Negative for chest pain, palpitations and leg swelling.  Gastrointestinal: Positive for nausea. Negative for vomiting and abdominal pain.  Genitourinary: Negative for difficulty urinating.  Musculoskeletal: Negative for back pain.  Skin: Negative for rash.  Neurological: Negative for headaches.  All other systems reviewed and are negative.    Allergies  Penicillins  Home Medications   Current Outpatient Rx  Name  Route  Sig  Dispense  Refill  . acetaminophen (TYLENOL) 500 MG tablet   Oral  Take 1,000 mg by mouth every 6 (six) hours as needed for pain.         Marland Kitchen aspirin-acetaminophen-caffeine (EXCEDRIN MIGRAINE) 250-250-65 MG per tablet   Oral   Take 1 tablet by mouth every 6 (six) hours as needed for pain (Migraine).          . Calcium Carbonate-Vitamin D (CALCIUM 600 + D PO)   Oral   Take 1 tablet by mouth 2 (two) times daily.         . cetirizine (ZYRTEC) 10 MG tablet   Oral   Take 10 mg by mouth daily.         . citalopram (CELEXA) 20 MG tablet   Oral   Take 20 mg by mouth every evening.          .  fesoterodine (TOVIAZ) 8 MG TB24   Oral   Take 8 mg by mouth daily.         Marland Kitchen LORazepam (ATIVAN) 0.5 MG tablet   Oral   Take 1 tablet (0.5 mg total) by mouth at bedtime as needed for anxiety.   30 tablet   1   . mometasone-formoterol (DULERA) 100-5 MCG/ACT AERO   Inhalation   Inhale 2 puffs into the lungs 2 (two) times daily.           . Multiple Vitamins-Minerals (ALIVE ONCE DAILY WOMENS 50+ PO)   Oral   Take 1 tablet by mouth daily.         . SUMAtriptan (IMITREX) 100 MG tablet   Oral   Take 50-100 mg by mouth every 2 (two) hours as needed. *Not to exceed two tablets within 24 hours**         . triamcinolone cream (KENALOG) 0.1 %   Topical   Apply 1 application topically 2 (two) times daily as needed. Rash.         . zolpidem (AMBIEN CR) 12.5 MG CR tablet   Oral   Take 1 tablet (12.5 mg total) by mouth at bedtime as needed for sleep.   20 tablet   1   . albuterol (VENTOLIN HFA) 108 (90 BASE) MCG/ACT inhaler   Inhalation   Inhale 2 puffs into the lungs every 4 (four) hours as needed. For shortness of breath           Triage Vitals: BP 119/80  Pulse 76  Temp(Src) 98.7 F (37.1 C) (Oral)  Resp 18  Ht 5\' 5"  (1.651 m)  Wt 130 lb (58.968 kg)  BMI 21.63 kg/m2  SpO2 99%  Physical Exam  Constitutional: She is oriented to person, place, and time. She appears well-developed and well-nourished.  HENT:  Head: Normocephalic and atraumatic.  Right Ear: Tympanic membrane is bulging.  Left Ear: Tympanic membrane, external ear and ear canal normal.  Bulging right TM with dried blood at the base. Questionable trauma. Decreased hearing in right ear as compared to the left.   Eyes: Conjunctivae are normal. Pupils are equal, round, and reactive to light.  Fatigable horizontal nystagmus.   Neck: Normal range of motion. Neck supple.  Cardiovascular: Normal rate, regular rhythm and normal heart sounds.   Pulmonary/Chest: Effort normal and breath sounds normal.   Abdominal: Soft. Bowel sounds are normal. There is no tenderness.  Musculoskeletal: Normal range of motion.  Neurological: She is alert and oriented to person, place, and time.  5/5 strength in all extremities. Sensation grossly intact. Finger to nose intact bilaterally.   Skin: Skin is warm  and dry. No rash noted.    ED Course  Procedures (including critical care time) DIAGNOSTIC STUDIES: Oxygen Saturation is 99% on room air, normal by my interpretation.    COORDINATION OF CARE: 1:46 PM- Patient informed of current plan for treatment and evaluation and agrees with plan at this time.   3:24 PM- Patient's symptoms and exam are consistent with TM injury and vertigo. Will provide instructions for home care.    1. Injury of tympanic membrane, right, initial encounter   2. Vertigo       MDM  I personally performed the services described in this documentation, which was scribed in my presence. The recorded information has been reviewed and is accurate.    [name], MD 07/28/12 1640

## 2012-07-28 NOTE — ED Notes (Signed)
Migraine x 1 wk ago - states got better but woke up this morning with stabbing pain to right side of head with light/sound sensitivity and n/v.

## 2013-05-27 ENCOUNTER — Other Ambulatory Visit (HOSPITAL_COMMUNITY): Payer: Self-pay | Admitting: Family Medicine

## 2013-05-27 DIAGNOSIS — Z1231 Encounter for screening mammogram for malignant neoplasm of breast: Secondary | ICD-10-CM

## 2013-05-27 DIAGNOSIS — M810 Age-related osteoporosis without current pathological fracture: Secondary | ICD-10-CM

## 2013-06-01 ENCOUNTER — Encounter (INDEPENDENT_AMBULATORY_CARE_PROVIDER_SITE_OTHER): Payer: Self-pay | Admitting: *Deleted

## 2013-06-02 ENCOUNTER — Ambulatory Visit (HOSPITAL_COMMUNITY): Payer: Self-pay

## 2013-06-02 ENCOUNTER — Ambulatory Visit (HOSPITAL_COMMUNITY)
Admission: RE | Admit: 2013-06-02 | Discharge: 2013-06-02 | Disposition: A | Discharge status: Home / Self Care | Payer: Managed Care, Other (non HMO) | Source: Ambulatory Visit | Attending: Family Medicine | Admitting: Family Medicine

## 2013-06-02 DIAGNOSIS — Z1231 Encounter for screening mammogram for malignant neoplasm of breast: Secondary | ICD-10-CM | POA: Insufficient documentation

## 2013-06-03 ENCOUNTER — Other Ambulatory Visit: Payer: Self-pay | Admitting: Family Medicine

## 2013-06-03 ENCOUNTER — Other Ambulatory Visit (HOSPITAL_COMMUNITY): Payer: Self-pay

## 2013-06-03 DIAGNOSIS — R928 Other abnormal and inconclusive findings on diagnostic imaging of breast: Secondary | ICD-10-CM

## 2013-06-05 ENCOUNTER — Ambulatory Visit (HOSPITAL_COMMUNITY): Payer: Self-pay

## 2013-06-05 ENCOUNTER — Ambulatory Visit (HOSPITAL_COMMUNITY)
Admission: RE | Admit: 2013-06-05 | Discharge: 2013-06-05 | Disposition: A | Discharge status: Home / Self Care | Payer: Managed Care, Other (non HMO) | Source: Ambulatory Visit | Attending: Family Medicine | Admitting: Family Medicine

## 2013-06-05 DIAGNOSIS — M949 Disorder of cartilage, unspecified: Principal | ICD-10-CM

## 2013-06-05 DIAGNOSIS — M810 Age-related osteoporosis without current pathological fracture: Secondary | ICD-10-CM

## 2013-06-05 DIAGNOSIS — M899 Disorder of bone, unspecified: Secondary | ICD-10-CM | POA: Insufficient documentation

## 2013-06-08 ENCOUNTER — Other Ambulatory Visit (INDEPENDENT_AMBULATORY_CARE_PROVIDER_SITE_OTHER): Payer: Self-pay | Admitting: *Deleted

## 2013-06-08 ENCOUNTER — Telehealth (INDEPENDENT_AMBULATORY_CARE_PROVIDER_SITE_OTHER): Payer: Self-pay | Admitting: *Deleted

## 2013-06-08 ENCOUNTER — Encounter (INDEPENDENT_AMBULATORY_CARE_PROVIDER_SITE_OTHER): Payer: Self-pay | Admitting: *Deleted

## 2013-06-08 DIAGNOSIS — Z1211 Encounter for screening for malignant neoplasm of colon: Secondary | ICD-10-CM

## 2013-06-08 MED ORDER — PEG 3350-KCL-NA BICARB-NACL 420 G PO SOLR
4000.0000 mL | Freq: Once | ORAL | Status: DC
Start: 2013-06-08 — End: 2013-07-16

## 2013-06-08 NOTE — Telephone Encounter (Signed)
Patient needs trilyte 

## 2013-06-10 ENCOUNTER — Ambulatory Visit (HOSPITAL_COMMUNITY)
Admission: RE | Admit: 2013-06-10 | Discharge: 2013-06-10 | Disposition: A | Discharge status: Home / Self Care | Payer: Managed Care, Other (non HMO) | Source: Ambulatory Visit | Attending: Family Medicine | Admitting: Family Medicine

## 2013-06-10 DIAGNOSIS — R928 Other abnormal and inconclusive findings on diagnostic imaging of breast: Secondary | ICD-10-CM | POA: Insufficient documentation

## 2013-07-02 ENCOUNTER — Telehealth (INDEPENDENT_AMBULATORY_CARE_PROVIDER_SITE_OTHER): Payer: Self-pay | Admitting: *Deleted

## 2013-07-02 NOTE — Telephone Encounter (Signed)
agree

## 2013-07-02 NOTE — Telephone Encounter (Signed)
  Procedure: tcs  Reason/Indication:  screening  Has patient had this procedure before?  Yes, 10 years ago  If so, when, by whom and where?    Is there a family history of colon cancer?  no  Who?  What age when diagnosed?    Is patient diabetic?   no      Does patient have prosthetic heart valve?  no  Do you have a pacemaker?  no  Has patient ever had endocarditis? no  Has patient had joint replacement within last 12 months?  no  Does patient tend to be constipated or take laxatives? some  Is patient on Coumadin, Plavix and/or Aspirin? no  Medications: calcium 600 w/ vit d3 bid, zyrtec daily, toviaz 8 mg daily, levothyroxine 25 mcg daily, dulera 200/5 mcg 2 puffs bid, ventolin inhaler prn  Allergies: pcn  Medication Adjustment:   Procedure date & time: 07/30/13 at 930

## 2013-07-16 ENCOUNTER — Encounter (HOSPITAL_COMMUNITY): Payer: Self-pay | Admitting: Pharmacy Technician

## 2013-07-30 ENCOUNTER — Encounter (HOSPITAL_COMMUNITY): Payer: Self-pay | Admitting: *Deleted

## 2013-07-30 ENCOUNTER — Ambulatory Visit (HOSPITAL_COMMUNITY)
Admission: RE | Admit: 2013-07-30 | Discharge: 2013-07-30 | Disposition: A | Discharge status: Home / Self Care | Payer: Managed Care, Other (non HMO) | Source: Ambulatory Visit | Attending: Internal Medicine | Admitting: Internal Medicine

## 2013-07-30 ENCOUNTER — Encounter (HOSPITAL_COMMUNITY)
Admission: RE | Disposition: A | Discharge status: Home / Self Care | Payer: Self-pay | Source: Ambulatory Visit | Attending: Internal Medicine

## 2013-07-30 DIAGNOSIS — J4489 Other specified chronic obstructive pulmonary disease: Secondary | ICD-10-CM | POA: Insufficient documentation

## 2013-07-30 DIAGNOSIS — Z1211 Encounter for screening for malignant neoplasm of colon: Secondary | ICD-10-CM

## 2013-07-30 DIAGNOSIS — Z79899 Other long term (current) drug therapy: Secondary | ICD-10-CM | POA: Insufficient documentation

## 2013-07-30 DIAGNOSIS — E785 Hyperlipidemia, unspecified: Secondary | ICD-10-CM | POA: Insufficient documentation

## 2013-07-30 DIAGNOSIS — F411 Generalized anxiety disorder: Secondary | ICD-10-CM | POA: Insufficient documentation

## 2013-07-30 DIAGNOSIS — K644 Residual hemorrhoidal skin tags: Secondary | ICD-10-CM | POA: Insufficient documentation

## 2013-07-30 DIAGNOSIS — J449 Chronic obstructive pulmonary disease, unspecified: Secondary | ICD-10-CM | POA: Insufficient documentation

## 2013-07-30 HISTORY — DX: Hypothyroidism, unspecified: E03.9

## 2013-07-30 HISTORY — DX: Anxiety disorder, unspecified: F41.9

## 2013-07-30 HISTORY — DX: Shortness of breath: R06.02

## 2013-07-30 HISTORY — PX: COLONOSCOPY: SHX5424

## 2013-07-30 SURGERY — COLONOSCOPY
Anesthesia: Moderate Sedation

## 2013-07-30 MED ORDER — MIDAZOLAM HCL 5 MG/5ML IJ SOLN
INTRAMUSCULAR | Status: AC
  Filled 2013-07-30: qty 10

## 2013-07-30 MED ORDER — MEPERIDINE HCL 50 MG/ML IJ SOLN
INTRAMUSCULAR | Status: DC
Start: 2013-07-30 — End: 2013-07-30
  Filled 2013-07-30: qty 1

## 2013-07-30 MED ORDER — MEPERIDINE HCL 50 MG/ML IJ SOLN
INTRAMUSCULAR | Status: DC | PRN
  Administered 2013-07-30: 20 mg

## 2013-07-30 MED ORDER — SODIUM CHLORIDE 0.9 % IV SOLN
INTRAVENOUS | Status: DC
  Administered 2013-07-30: 09:00:00 via INTRAVENOUS

## 2013-07-30 MED ORDER — STERILE WATER FOR IRRIGATION IR SOLN
Status: DC | PRN
  Administered 2013-07-30: 09:00:00

## 2013-07-30 MED ORDER — MIDAZOLAM HCL 5 MG/5ML IJ SOLN
INTRAMUSCULAR | Status: DC | PRN
  Administered 2013-07-30: 1 mg via INTRAVENOUS
  Administered 2013-07-30 (×2): 2 mg via INTRAVENOUS

## 2013-07-30 NOTE — Discharge Instructions (Signed)
Resume usual medications and diet. No driving for 24 hours. Next screening exam in 10 years.    Colonoscopy, Care After Refer to this sheet in the next few weeks. These instructions provide you with information on caring for yourself after your procedure. Your health care provider may also give you more specific instructions. Your treatment has been planned according to current medical practices, but problems sometimes occur. Call your health care provider if you have any problems or questions after your procedure. WHAT TO EXPECT AFTER THE PROCEDURE  After your procedure, it is typical to have the following:  A small amount of blood in your stool.  Moderate amounts of gas and mild abdominal cramping or bloating. HOME CARE INSTRUCTIONS  Do not drive, operate machinery, or sign important documents for 24 hours.  You may shower and resume your regular physical activities, but move at a slower pace for the first 24 hours.  Take frequent rest periods for the first 24 hours.  Walk around or put a warm pack on your abdomen to help reduce abdominal cramping and bloating.  Drink enough fluids to keep your urine clear or pale yellow.  You may resume your normal diet as instructed by your health care provider. Avoid heavy or fried foods that are hard to digest.  Avoid drinking alcohol for 24 hours or as instructed by your health care provider.  Only take over-the-counter or prescription medicines as directed by your health care provider.    SEEK MEDICAL CARE IF: You have persistent spotting of blood in your stool 2 3 days after the procedure. SEEK IMMEDIATE MEDICAL CARE IF:  You have more than a small spotting of blood in your stool.  You pass large blood clots in your stool.  Your abdomen is swollen (distended).  You have nausea or vomiting.  You have a fever.  You have increasing abdominal pain that is not relieved with medicine.

## 2013-07-30 NOTE — Op Note (Signed)
COLONOSCOPY PROCEDURE REPORT  PATIENT:  [name]  MR#:  [id number] Birthdate:  07/12/1965, 48 y.o., female Endoscopist:  Dr. Malissa HippoNajeeb U. Rehman, MD Referred By:  Dr. Trinna PostJunell Carol Koberlein, MD   Procedure Date: 07/30/2013  Procedure:   Colonoscopy  Indications:  Patient is 48 year old Caucasian female who is undergoing average risk screening colonoscopy. She has intermittent hematochezia felt to be secondary to hemorrhoids.  Informed Consent:  The procedure and risks were reviewed with the patient and informed consent was obtained.  Medications:  Demerol 20 mg IV Versed 5 mg IV  Description of procedure:  After a digital rectal exam was performed, that colonoscope was advanced from the anus through the rectum and colon to the area of the cecum, ileocecal valve and appendiceal orifice. The cecum was deeply intubated. These structures were well-seen and photographed for the record. From the level of the cecum and ileocecal valve, the scope was slowly and cautiously withdrawn. The mucosal surfaces were carefully surveyed utilizing scope tip to flexion to facilitate fold flattening as needed. The scope was pulled down into the rectum where a thorough exam including retroflexion was performed.  Findings:   Prep satisfactory. Normal mucosa of cecum, ascending colon, hepatic flexure, transverse colon, splenic flexure, descending and sigmoid colon. Normal rectal mucosa. Paul hemorrhoids below the dentate line.   Therapeutic/Diagnostic Maneuvers Performed:   None  Complications:  None  Cecal Withdrawal Time:  9 minutes  Impression:  Normal colonoscopy except small external hemorrhoids.  Recommendations:  Standard instructions given. Next screening exam in 10 years.  Malissa Hippoajeeb U Rehman  07/30/2013 9:48 AM  CC: Dr. Hassan RowanKOBERLEIN, Su MonksJUNELL CAROL, MD & Dr. Bonnetta BarryNo ref. provider found

## 2013-07-30 NOTE — H&P (Signed)
[name] is an 48 y.o. female.   Chief Complaint: Patient is here for colonoscopy. HPI: Patient is 48 year old Caucasian female who is here for screening colonoscopy. She did have colonoscopy for 10 years ago which reportedly was unremarkable. She denies abdominal pain change in bowel habits or frank rectal bleeding. She has occasional hematochezia felt to be secondary to hemorrhoids. Family history is negative for CRC.  Past Medical History  Diagnosis Date  . Rectal discharge 07/28/2010  . Sluggishness 07/28/2010  . Cluster headaches   . COPD (chronic obstructive pulmonary disease)   . Asthma   . Hyperlipemia   . COPD (chronic obstructive pulmonary disease) with chronic bronchitis   . Pneumothorax   . Shortness of breath   . Hypothyroidism   . Anxiety     Past Surgical History  Procedure Laterality Date  . Abdominal hysterectomy    . Lobectomy      right lung   . Cholecystectomy    . Tubal ligation      Family History  Problem Relation Age of Onset  . Colon cancer Neg Hx    Social History:  reports that she quit smoking about 6 years ago. Her smoking use included Cigarettes. She has a 30 pack-year smoking history. She has never used smokeless tobacco. She reports that she does not drink alcohol or use illicit drugs.  Allergies:  Allergies  Allergen Reactions  . Penicillins Other (See Comments)    Childhood Reaction     Medications Prior to Admission  Medication Sig Dispense Refill  . acetaminophen (TYLENOL) 500 MG tablet Take 1,000 mg by mouth every 6 (six) hours as needed for pain.      Marland Kitchen. albuterol (VENTOLIN HFA) 108 (90 BASE) MCG/ACT inhaler Inhale 2 puffs into the lungs every 4 (four) hours as needed. For shortness of breath      . aspirin-acetaminophen-caffeine (EXCEDRIN MIGRAINE) 250-250-65 MG per tablet Take 1 tablet by mouth every 6 (six) hours as needed for pain (Migraine).       . B Complex-C (SUPER B COMPLEX PO) Take 1 tablet by mouth daily.       . Calcium Carbonate-Vitamin D (CALCIUM 600 + D PO) Take 1 tablet by mouth 2 (two) times daily.      . cetirizine (ZYRTEC) 10 MG tablet Take 10 mg by mouth daily.      . fesoterodine (TOVIAZ) 8 MG TB24 Take 8 mg by mouth daily.      Marland Kitchen. levothyroxine (SYNTHROID, LEVOTHROID) 25 MCG tablet Take 25 mcg by mouth daily before breakfast.      . mometasone-formoterol (DULERA) 100-5 MCG/ACT AERO Inhale 2 puffs into the lungs 2 (two) times daily.        Marland Kitchen. HYDROcodone-acetaminophen (NORCO) 5-325 MG per tablet Take 1 tablet by mouth every 4 (four) hours as needed for pain.  10 tablet  0  . LORazepam (ATIVAN) 0.5 MG tablet Take 1 tablet (0.5 mg total) by mouth at bedtime as needed for anxiety.  30 tablet  1  . rizatriptan (MAXALT) 10 MG tablet Take 10 mg by mouth as needed for migraine. May repeat in 2 hours if needed      . SUMAtriptan (IMITREX) 100 MG tablet Take 50-100 mg by mouth every 2 (two) hours as needed. *Not to exceed two tablets within 24 hours**        No results found for this or any previous visit (from the past 48 hour(s)). No results found.  ROS  Blood  pressure 102/70, pulse 81, temperature 98 F (36.7 C), temperature source Oral, resp. rate 13, height 5\' 5"  (1.651 m), weight 115 lb (52.164 kg), SpO2 97.00%. Physical Exam  Constitutional: She appears well-developed and well-nourished.  HENT:  Mouth/Throat: Oropharynx is clear and moist.  Eyes: Conjunctivae are normal. No scleral icterus.  Neck: No thyromegaly present.  Cardiovascular: Normal rate, regular rhythm and normal heart sounds.   Respiratory: Effort normal and breath sounds normal.  GI: Soft. She exhibits no mass.  Musculoskeletal: She exhibits no edema.  Lymphadenopathy:    She has no cervical adenopathy.  Neurological: She is alert.  Skin: Skin is warm and dry.     Assessment/Plan Average risk screening colonoscopy.  Najeeb U Rehman 07/30/2013, 9:15 AM

## 2013-08-03 ENCOUNTER — Encounter (HOSPITAL_COMMUNITY): Payer: Self-pay | Admitting: Internal Medicine

## 2013-11-25 ENCOUNTER — Ambulatory Visit (HOSPITAL_COMMUNITY)
Admission: RE | Admit: 2013-11-25 | Discharge: 2013-11-25 | Disposition: A | Discharge status: Home / Self Care | Payer: Managed Care, Other (non HMO) | Source: Ambulatory Visit | Attending: Family Medicine | Admitting: Family Medicine

## 2013-11-25 ENCOUNTER — Other Ambulatory Visit (HOSPITAL_COMMUNITY): Payer: Self-pay | Admitting: Family Medicine

## 2013-11-25 DIAGNOSIS — J449 Chronic obstructive pulmonary disease, unspecified: Secondary | ICD-10-CM | POA: Diagnosis not present

## 2013-11-25 DIAGNOSIS — R634 Abnormal weight loss: Secondary | ICD-10-CM | POA: Diagnosis present

## 2013-11-25 DIAGNOSIS — J984 Other disorders of lung: Secondary | ICD-10-CM | POA: Insufficient documentation

## 2013-11-25 DIAGNOSIS — J4489 Other specified chronic obstructive pulmonary disease: Secondary | ICD-10-CM | POA: Insufficient documentation

## 2013-12-17 ENCOUNTER — Emergency Department (HOSPITAL_COMMUNITY)
Admission: EM | Admit: 2013-12-17 | Discharge: 2013-12-17 | Disposition: A | Discharge status: Home / Self Care | Payer: Managed Care, Other (non HMO) | Attending: Emergency Medicine | Admitting: Emergency Medicine

## 2013-12-17 ENCOUNTER — Encounter (HOSPITAL_COMMUNITY): Payer: Self-pay | Admitting: Emergency Medicine

## 2013-12-17 DIAGNOSIS — G43009 Migraine without aura, not intractable, without status migrainosus: Secondary | ICD-10-CM | POA: Diagnosis not present

## 2013-12-17 DIAGNOSIS — R110 Nausea: Secondary | ICD-10-CM | POA: Insufficient documentation

## 2013-12-17 DIAGNOSIS — F411 Generalized anxiety disorder: Secondary | ICD-10-CM | POA: Insufficient documentation

## 2013-12-17 DIAGNOSIS — J441 Chronic obstructive pulmonary disease with (acute) exacerbation: Secondary | ICD-10-CM | POA: Diagnosis not present

## 2013-12-17 DIAGNOSIS — Z880 Allergy status to penicillin: Secondary | ICD-10-CM | POA: Insufficient documentation

## 2013-12-17 DIAGNOSIS — E039 Hypothyroidism, unspecified: Secondary | ICD-10-CM | POA: Insufficient documentation

## 2013-12-17 DIAGNOSIS — Z79899 Other long term (current) drug therapy: Secondary | ICD-10-CM | POA: Insufficient documentation

## 2013-12-17 DIAGNOSIS — G43909 Migraine, unspecified, not intractable, without status migrainosus: Secondary | ICD-10-CM | POA: Diagnosis present

## 2013-12-17 DIAGNOSIS — Z87891 Personal history of nicotine dependence: Secondary | ICD-10-CM | POA: Diagnosis not present

## 2013-12-17 DIAGNOSIS — J45901 Unspecified asthma with (acute) exacerbation: Secondary | ICD-10-CM

## 2013-12-17 MED ORDER — HYDROCODONE-ACETAMINOPHEN 5-325 MG PO TABS: 1.0000 | ORAL_TABLET | ORAL | Status: DC | PRN

## 2013-12-17 MED ORDER — DEXAMETHASONE SODIUM PHOSPHATE 10 MG/ML IJ SOLN
10.0000 mg | Freq: Once | INTRAMUSCULAR | Status: AC
  Administered 2013-12-17: 10 mg via INTRAVENOUS
  Filled 2013-12-17: qty 1

## 2013-12-17 MED ORDER — MORPHINE SULFATE 4 MG/ML IJ SOLN
4.0000 mg | Freq: Once | INTRAMUSCULAR | Status: DC
  Filled 2013-12-17: qty 1

## 2013-12-17 MED ORDER — FENTANYL CITRATE 0.05 MG/ML IJ SOLN
50.0000 ug | Freq: Once | INTRAMUSCULAR | Status: AC
  Administered 2013-12-17: 50 ug via INTRAVENOUS
  Filled 2013-12-17: qty 2

## 2013-12-17 MED ORDER — SODIUM CHLORIDE 0.9 % IV BOLUS (SEPSIS)
1000.0000 mL | Freq: Once | INTRAVENOUS | Status: AC
  Administered 2013-12-17: 1000 mL via INTRAVENOUS

## 2013-12-17 MED ORDER — DIPHENHYDRAMINE HCL 50 MG/ML IJ SOLN
12.5000 mg | Freq: Once | INTRAMUSCULAR | Status: AC
  Administered 2013-12-17: 12.5 mg via INTRAVENOUS
  Filled 2013-12-17: qty 1

## 2013-12-17 MED ORDER — BUTALBITAL-ASPIRIN-CAFFEINE 50-325-40 MG PO CAPS
1.0000 | ORAL_CAPSULE | ORAL | Status: DC | PRN
Start: 2013-12-17 — End: 2014-07-13

## 2013-12-17 MED ORDER — METOCLOPRAMIDE HCL 5 MG/ML IJ SOLN
10.0000 mg | Freq: Once | INTRAMUSCULAR | Status: AC
  Administered 2013-12-17: 10 mg via INTRAVENOUS
  Filled 2013-12-17: qty 2

## 2013-12-17 NOTE — ED Notes (Signed)
Holding morphine based on low BP. Attempted x2 to contact EDP; will continue to try and contact EDP regarding low BP.

## 2013-12-17 NOTE — ED Notes (Signed)
Burgess Amor, PA notified of low BP. Per Raynelle Fanning, morphine will be held until 1 L fluids are in; will recheck BP.

## 2013-12-17 NOTE — ED Notes (Signed)
Pt complains of migraine x1 week, photosensitivity, noise sensitivity, n/v.  Has hx of migraines, has tried pRx medications with no relief.

## 2013-12-17 NOTE — ED Provider Notes (Signed)
CSN: [id number]     Arrival date & time 12/17/13  7829 History   First MD Initiated Contact with Patient 12/17/13 [phone]     Chief Complaint  Patient presents with  . Migraine     (Consider location/radiation/quality/duration/timing/severity/associated sxs/prior Treatment) The history is provided by the patient.   [name] is a 48 y.o. female presenting with migraine headache which started 4 days ago.  The patient has a history of migraine and the current symptoms are similar to previous episodes of migraine headache.  The patients symptoms were not preceded by prodromal symptoms including visual changes or scotoma.  The patient has bilateral throbbing headache pain in association with nausea without emesis.  There has been no fevers, chills, syncope, confusion or localized weakness.  The patient has tried both  Imitrex and Maxalt, additionally has tried Excedrin Migraine without relief of symptoms.      Past Medical History  Diagnosis Date  . Rectal discharge 07/28/2010  . Sluggishness 07/28/2010  . Cluster headaches   . COPD (chronic obstructive pulmonary disease)   . Asthma   . Hyperlipemia   . COPD (chronic obstructive pulmonary disease) with chronic bronchitis   . Pneumothorax   . Shortness of breath   . Hypothyroidism   . Anxiety    Past Surgical History  Procedure Laterality Date  . Abdominal hysterectomy    . Lobectomy      right lung   . Cholecystectomy    . Tubal ligation    . Colonoscopy N/A 07/30/2013    Procedure: COLONOSCOPY;  Surgeon: Malissa Hippo, MD;  Location: [facility];  Service: Endoscopy;  Laterality: N/A;  930   Family History  Problem Relation Age of Onset  . Colon cancer Neg Hx    History  Substance Use Topics  . Smoking status: Former Smoker -- 1.00 packs/day for 30 years    Types: Cigarettes    Quit date: 02/21/2007  . Smokeless tobacco: Never Used  . Alcohol Use: No     Comment: no   OB History   Grav Para Term Preterm  Abortions TAB SAB Ect Mult Living                 Review of Systems  Constitutional: Negative for fever.  HENT: Negative for congestion and sore throat.   Eyes: Negative.   Respiratory: Negative for chest tightness and shortness of breath.   Cardiovascular: Negative for chest pain.  Gastrointestinal: Positive for nausea. Negative for vomiting and abdominal pain.  Genitourinary: Negative.   Musculoskeletal: Negative for arthralgias, joint swelling and neck pain.  Skin: Negative.  Negative for rash and wound.  Neurological: Positive for headaches. Negative for dizziness, weakness, light-headedness and numbness.  Psychiatric/Behavioral: Negative.       Allergies  Penicillins  Home Medications   Prior to Admission medications   Medication Sig Start Date End Date Taking? Authorizing Provider  acetaminophen (TYLENOL) 500 MG tablet Take 1,000 mg by mouth every 6 (six) hours as needed for pain.   Yes Historical Provider, MD  albuterol (VENTOLIN HFA) 108 (90 BASE) MCG/ACT inhaler Inhale 2 puffs into the lungs every 4 (four) hours as needed. For shortness of breath   Yes Historical Provider, MD  aspirin-acetaminophen-caffeine (EXCEDRIN MIGRAINE) [phone] MG per tablet Take 1 tablet by mouth every 6 (six) hours as needed for pain (Migraine).    Yes Historical Provider, MD  Calcium Carbonate-Vitamin D (CALCIUM 600 + D PO) Take 1 tablet by mouth 2 (  two) times daily.   Yes Historical Provider, MD  cetirizine (ZYRTEC) 10 MG tablet Take 10 mg by mouth daily.   Yes Historical Provider, MD  citalopram (CELEXA) 20 MG tablet Take 20 mg by mouth at bedtime.   Yes Historical Provider, MD  HYDROcodone-acetaminophen (NORCO) 5-325 MG per tablet Take 1 tablet by mouth every 4 (four) hours as needed for pain. 07/28/12  Yes [name], MD  levothyroxine (SYNTHROID, LEVOTHROID) 50 MCG tablet Take 50 mcg by mouth daily before breakfast.   Yes Historical Provider, MD  LORazepam (ATIVAN) 0.5 MG tablet Take  1 tablet (0.5 mg total) by mouth at bedtime as needed for anxiety. 03/21/12  Yes Michael L. Soo, MD  rizatriptan (MAXALT) 10 MG tablet Take 10 mg by mouth as needed for migraine. May repeat in 2 hours if needed   Yes Historical Provider, MD  SUMAtriptan (IMITREX) 100 MG tablet Take 50-100 mg by mouth every 2 (two) hours as needed. *Not to exceed two tablets within 24 hours**   Yes Historical Provider, MD  butalbital-aspirin-caffeine [facility]) 50-325-40 MG per capsule Take 1-2 capsules by mouth every 4 (four) hours as needed for headache (Maximun of 6 capsules in a 24 hour period). 12/17/13   Burgess Amor, PA-C  HYDROcodone-acetaminophen (NORCO/VICODIN) 5-325 MG per tablet Take 1 tablet by mouth every 4 (four) hours as needed. 12/17/13   Burgess Amor, PA-C   BP 95/64  Pulse 69  Temp(Src) 98 F (36.7 C) (Oral)  Resp 16  Ht  (1.651 m)  Wt 106 lb (48.081 kg)  BMI 17.64 kg/m2  SpO2 98% Physical Exam  Nursing note and vitals reviewed. Constitutional: She is oriented to person, place, and time. She appears well-developed and well-nourished.  Uncomfortable appearing  HENT:  Head: Normocephalic and atraumatic.  Mouth/Throat: Oropharynx is clear and moist.  Eyes: EOM are normal. Pupils are equal, round, and reactive to light.  Neck: Normal range of motion. Neck supple.  Cardiovascular: Normal rate and normal heart sounds.   Pulmonary/Chest: Effort normal.  Abdominal: Soft. There is no tenderness.  Musculoskeletal: Normal range of motion.  Lymphadenopathy:    She has no cervical adenopathy.  Neurological: She is alert and oriented to person, place, and time. She has normal strength. No sensory deficit. Gait normal. GCS eye subscore is 4. GCS verbal subscore is 5. GCS motor subscore is 6.  Normal heel-shin, normal rapid alternating movements. Cranial nerves III-XII intact.  No pronator drift.  Skin: Skin is warm and dry. No rash noted.  Psychiatric: She has a normal mood and affect. Her speech  is normal and behavior is normal. Thought content normal. Cognition and memory are normal.    ED Course  Procedures (including critical care time) Labs Review Labs Reviewed - No data to display  Imaging Review No results found.   EKG Interpretation None      MDM   Final diagnoses:  Migraine without aura and without status migrainosus, not intractable    Patient was given IV fluids, 2 L.  Her initial blood pressure was hypotensive and remained so during her ED visit.  She endorses that her blood pressures generally run low and she denies having weakness or lightheadedness today.  Review of previous blood pressures at other ED visits are consistent with today's numbers.  She was given Decadron, Benadryl and Reglan through an IV with no significant headache improvement.  She was given fentanyl 50 mcg which significantly improved her pain from a 10/10 to 4/10.  She felt comfortable going home at this point in sleeping, mother is driving.  She was prescribed hydrocodone if headache persists when she wakes.  She was also prescribed Fiorinal to hold for now but try taking with her next migraine to see if this medication will be more helpful.  The patient appears reasonably screened and/or stabilized for discharge and I doubt any other medical condition or other [facility] requiring further screening, evaluation, or treatment in the ED at this time prior to discharge.     Burgess Amor, PA-C 12/17/13 [phone]

## 2013-12-17 NOTE — Discharge Instructions (Signed)

## 2013-12-19 NOTE — ED Provider Notes (Signed)
Medical screening examination/treatment/procedure(s) were conducted as a shared visit with non-physician practitioner(s) and myself.  I personally evaluated the patient during the encounter.   EKG Interpretation None     No neurological deficits. Patient has chronic migraine headaches. Hypotension is not new   Donnetta Hutching, MD 12/19/13 [phone]

## 2014-04-04 ENCOUNTER — Emergency Department (HOSPITAL_COMMUNITY)
Admission: EM | Admit: 2014-04-04 | Discharge: 2014-04-04 | Disposition: A | Discharge status: Home / Self Care | Payer: Managed Care, Other (non HMO) | Attending: Emergency Medicine | Admitting: Emergency Medicine

## 2014-04-04 ENCOUNTER — Emergency Department (HOSPITAL_COMMUNITY): Payer: Managed Care, Other (non HMO)

## 2014-04-04 ENCOUNTER — Encounter (HOSPITAL_COMMUNITY): Payer: Self-pay | Admitting: Emergency Medicine

## 2014-04-04 DIAGNOSIS — Z87891 Personal history of nicotine dependence: Secondary | ICD-10-CM | POA: Diagnosis not present

## 2014-04-04 DIAGNOSIS — Z79899 Other long term (current) drug therapy: Secondary | ICD-10-CM | POA: Insufficient documentation

## 2014-04-04 DIAGNOSIS — Z880 Allergy status to penicillin: Secondary | ICD-10-CM | POA: Diagnosis not present

## 2014-04-04 DIAGNOSIS — E785 Hyperlipidemia, unspecified: Secondary | ICD-10-CM | POA: Diagnosis not present

## 2014-04-04 DIAGNOSIS — R519 Headache, unspecified: Secondary | ICD-10-CM

## 2014-04-04 DIAGNOSIS — F419 Anxiety disorder, unspecified: Secondary | ICD-10-CM | POA: Insufficient documentation

## 2014-04-04 DIAGNOSIS — E039 Hypothyroidism, unspecified: Secondary | ICD-10-CM | POA: Diagnosis not present

## 2014-04-04 DIAGNOSIS — J449 Chronic obstructive pulmonary disease, unspecified: Secondary | ICD-10-CM | POA: Diagnosis not present

## 2014-04-04 DIAGNOSIS — G43909 Migraine, unspecified, not intractable, without status migrainosus: Secondary | ICD-10-CM | POA: Insufficient documentation

## 2014-04-04 DIAGNOSIS — R51 Headache: Secondary | ICD-10-CM

## 2014-04-04 HISTORY — DX: Migraine, unspecified, not intractable, without status migrainosus: G43.909

## 2014-04-04 LAB — BASIC METABOLIC PANEL
Anion gap: 6 (ref 5–15)
BUN: 19 mg/dL (ref 6–23)
CO2: 27 mmol/L (ref 19–32)
Calcium: 9.8 mg/dL (ref 8.4–10.5)
Chloride: 106 mEq/L (ref 96–112)
Creatinine, Ser: 0.69 mg/dL (ref 0.50–1.10)
GFR calc Af Amer: >90 mL/min (ref >90)
GFR calc non Af Amer: >90 mL/min (ref >90)
Glucose, Bld: 98 mg/dL (ref 70–99)
Potassium: 4.1 mmol/L (ref 3.5–5.1)
Sodium: 139 mmol/L (ref 135–145)

## 2014-04-04 LAB — CBC WITH DIFFERENTIAL/PLATELET
Basophils Absolute: 0 10*3/uL (ref 0.0–0.1)
Basophils Relative: 1 % (ref 0–1)
Eosinophils Absolute: 1 10*3/uL — ABNORMAL HIGH (ref 0.0–0.7)
Eosinophils Relative: 15 % — ABNORMAL HIGH (ref 0–5)
HCT: 35.7 % — ABNORMAL LOW (ref 36.0–46.0)
Hemoglobin: 11.4 g/dL — ABNORMAL LOW (ref 12.0–15.0)
Lymphocytes Relative: 34 % (ref 12–46)
Lymphs Abs: 2.3 10*3/uL (ref 0.7–4.0)
MCH: 30.3 pg (ref 26.0–34.0)
MCHC: 31.9 g/dL (ref 30.0–36.0)
MCV: 94.9 fL (ref 78.0–100.0)
Monocytes Absolute: 0.5 10*3/uL (ref 0.1–1.0)
Monocytes Relative: 8 % (ref 3–12)
Neutro Abs: 2.9 10*3/uL (ref 1.7–7.7)
Neutrophils Relative %: 42 % — ABNORMAL LOW (ref 43–77)
Platelets: 332 10*3/uL (ref 150–400)
RBC: 3.76 MIL/uL — ABNORMAL LOW (ref 3.87–5.11)
RDW: 12.4 % (ref 11.5–15.5)
WBC: 6.8 10*3/uL (ref 4.0–10.5)

## 2014-04-04 MED ORDER — METOCLOPRAMIDE HCL 5 MG/ML IJ SOLN
10.0000 mg | Freq: Once | INTRAMUSCULAR | Status: AC
  Administered 2014-04-04: 10 mg via INTRAVENOUS
  Filled 2014-04-04: qty 2

## 2014-04-04 MED ORDER — DIPHENHYDRAMINE HCL 50 MG/ML IJ SOLN
25.0000 mg | Freq: Once | INTRAMUSCULAR | Status: AC
  Administered 2014-04-04: 25 mg via INTRAVENOUS
  Filled 2014-04-04: qty 1

## 2014-04-04 MED ORDER — SODIUM CHLORIDE 0.9 % IV SOLN
INTRAVENOUS | Status: DC
  Administered 2014-04-04: 10:00:00 via INTRAVENOUS

## 2014-04-04 MED ORDER — DEXAMETHASONE SODIUM PHOSPHATE 4 MG/ML IJ SOLN
10.0000 mg | Freq: Once | INTRAMUSCULAR | Status: AC
  Administered 2014-04-04: 10 mg via INTRAVENOUS
  Filled 2014-04-04: qty 3

## 2014-04-04 NOTE — ED Notes (Signed)
PT c/o headache x1 week and became very sharp this morning around 0800. PT stated she became dizzy and the worse pain of her life in her head this morning at 0800. PT took Excedrin migraine at 0530 with no relief.

## 2014-04-04 NOTE — ED Provider Notes (Signed)
Pt well appearing and improved She has no arm/leg weakness No past pointing No facial droop She tells me she has had HA for 2 weeks with worsening today CT head negative and I doubt this represents occult SAH She is appropriate for d/c home We discussed strict return precautions   [name] W Jacquie Lukes, MD 04/04/14 1159

## 2014-04-04 NOTE — Discharge Instructions (Signed)

## 2014-04-04 NOTE — ED Provider Notes (Signed)
CSN: [id number]     Arrival date & time 04/04/14  [id number] History   First MD Initiated Contact with Patient 04/04/14 [phone]     Chief Complaint  Patient presents with  . Migraine     (Consider location/radiation/quality/duration/timing/severity/associated sxs/prior Treatment) Patient is a 48 y.o. female presenting with migraines. The history is provided by the patient.  Migraine This is a new problem. The current episode started today. The problem occurs constantly. The problem has been gradually worsening. Associated symptoms include headaches and nausea.   [name] is a 48 y.o. female who presents to the ED with a headache. She states she was at work and had sudden onset of severe pain of her entire head. She has had a headache off and on for the past 2 weeks but today this was different and felt severe. She reports nausea.   Past Medical History  Diagnosis Date  . Rectal discharge 07/28/2010  . Sluggishness 07/28/2010  . Cluster headaches   . COPD (chronic obstructive pulmonary disease)   . Asthma   . Hyperlipemia   . COPD (chronic obstructive pulmonary disease) with chronic bronchitis   . Pneumothorax   . Shortness of breath   . Hypothyroidism   . Anxiety   . Migraine    Past Surgical History  Procedure Laterality Date  . Abdominal hysterectomy    . Lobectomy      right lung   . Cholecystectomy    . Tubal ligation    . Colonoscopy N/A 07/30/2013    Procedure: COLONOSCOPY;  Surgeon: Malissa HippoNajeeb U Rehman, MD;  [facility];  Service: Endoscopy;  Laterality: N/A;  930   Family History  Problem Relation Age of Onset  . Colon cancer Neg Hx    History  Substance Use Topics  . Smoking status: Former Smoker -- 1.00 packs/day for 30 years    Types: Cigarettes    Quit date: 02/21/2007  . Smokeless tobacco: Never Used  . Alcohol Use: No     Comment: no   OB History    No data available     Review of Systems  Gastrointestinal: Positive for nausea.   Neurological: Positive for light-headedness and headaches.  All other systems negative    Allergies  Penicillins  Home Medications   Prior to Admission medications   Medication Sig Start Date End Date Taking? Authorizing Provider  acetaminophen (TYLENOL) 500 MG tablet Take 500 mg by mouth at bedtime.    Yes Historical Provider, MD  albuterol (VENTOLIN HFA) 108 (90 BASE) MCG/ACT inhaler Inhale 2 puffs into the lungs every 4 (four) hours as needed. For shortness of breath   Yes Historical Provider, MD  aspirin-acetaminophen-caffeine (EXCEDRIN MIGRAINE) [phone] MG per tablet Take 1 tablet by mouth every 6 (six) hours as needed for pain (Migraine).    Yes Historical Provider, MD  butalbital-aspirin-caffeine [facility](FIORINAL) 50-325-40 MG per capsule Take 1-2 capsules by mouth every 4 (four) hours as needed for headache (Maximun of 6 capsules in a 24 hour period). 12/17/13  Yes Burgess AmorJulie Idol, PA-C  Calcium Carbonate-Vitamin D (CALCIUM 600 + D PO) Take 1 tablet by mouth 2 (two) times daily.   Yes Historical Provider, MD  cetirizine (ZYRTEC) 10 MG tablet Take 10 mg by mouth daily.   Yes Historical Provider, MD  citalopram (CELEXA) 20 MG tablet Take 20 mg by mouth at bedtime.   Yes Historical Provider, MD  levothyroxine (SYNTHROID, LEVOTHROID) 50 MCG tablet Take 50 mcg by mouth daily before  breakfast.   Yes Historical Provider, MD  LORazepam (ATIVAN) 0.5 MG tablet Take 1 tablet (0.5 mg total) by mouth at bedtime as needed for anxiety. 03/21/12  Yes Michael L. Soo, MD  vitamin C (ASCORBIC ACID) 500 MG tablet Take 500 mg by mouth daily.   Yes Historical Provider, MD  HYDROcodone-acetaminophen (NORCO) 5-325 MG per tablet Take 1 tablet by mouth every 4 (four) hours as needed for pain. Patient not taking: Reported on 04/04/2014 07/28/12   Loren Raceravid Yelverton, MD  HYDROcodone-acetaminophen (NORCO/VICODIN) 5-325 MG per tablet Take 1 tablet by mouth every 4 (four) hours as needed. Patient not taking: Reported on  04/04/2014 12/17/13   Burgess AmorJulie Idol, PA-C   BP 120/70 mmHg  Pulse 72  Temp(Src) 98.3 F (36.8 C) (Oral)  Resp 18  Ht 5\' 4"  (1.626 m)  Wt 112 lb (50.803 kg)  BMI 19.22 kg/m2  SpO2 100%   Patient initially unable to tolerate exam. Medications given and exam done after pain had started to improve.   Physical Exam  Constitutional: She is oriented to person, place, and time. She appears well-developed and well-nourished. No distress.  HENT:  Head: Normocephalic and atraumatic.  Right Ear: Tympanic membrane normal.  Left Ear: Tympanic membrane normal.  Nose: Nose normal.  Mouth/Throat: Uvula is midline, oropharynx is clear and moist and mucous membranes are normal.  Eyes: Conjunctivae and EOM are normal. Pupils are equal, round, and reactive to light.  Neck: Normal range of motion. Neck supple.  Cardiovascular: Normal rate and regular rhythm.   Pulmonary/Chest: Effort normal. She has no wheezes. She has no rales.  Abdominal: Soft. Bowel sounds are normal. She exhibits no mass. There is no tenderness.  Musculoskeletal: Normal range of motion. She exhibits no edema.  Radial and pedal pulses strong, adequate circulation, good touch sensation.  Neurological: She is alert and oriented to person, place, and time. She has normal strength. No cranial nerve deficit or sensory deficit. She displays a negative Romberg sign. Gait normal.  Reflex Scores:      Bicep reflexes are 2+ on the right side and 2+ on the left side.      Brachioradialis reflexes are 2+ on the right side and 2+ on the left side.      Patellar reflexes are 2+ on the right side and 2+ on the left side.      Achilles reflexes are 2+ on the right side and 2+ on the left side. Rapid alternating movement without difficulty. Stands on one foot without difficulty. Steady gait without foot drag.   Skin: Skin is warm and dry.  Psychiatric: She has a normal mood and affect. Her behavior is normal.  Nursing note and vitals reviewed.   ED  Course  Procedures  @ 1135 patient feeling much better after IV medications of Benadryl 25 mg, Decadron 10 mg and Reglan 10 mg. She is able to tolerate the exam with out difficulty.   Results for orders placed or performed during the hospital encounter of 04/04/14 (from the past 24 hour(s))  CBC with Differential     Status: Abnormal   Collection Time: 04/04/14  9:30 AM  Result Value Ref Range   WBC 6.8 4.0 - 10.5 K/uL   RBC 3.76 (L) 3.87 - 5.11 MIL/uL   Hemoglobin 11.4 (L) 12.0 - 15.0 g/dL   HCT 40.935.7 (L) 81.136.0 - 91.446.0 %   MCV 94.9 78.0 - 100.0 fL   MCH 30.3 26.0 - 34.0 pg   MCHC 31.9 30.0 -  36.0 g/dL   RDW 16.1 09.6 - 04.5 %   Platelets 332 150 - 400 K/uL   Neutrophils Relative % 42 (L) 43 - 77 %   Neutro Abs 2.9 1.7 - 7.7 K/uL   Lymphocytes Relative 34 12 - 46 %   Lymphs Abs 2.3 0.7 - 4.0 K/uL   Monocytes Relative 8 3 - 12 %   Monocytes Absolute 0.5 0.1 - 1.0 K/uL   Eosinophils Relative 15 (H) 0 - 5 %   Eosinophils Absolute 1.0 (H) 0.0 - 0.7 K/uL   Basophils Relative 1 0 - 1 %   Basophils Absolute 0.0 0.0 - 0.1 K/uL  Basic metabolic panel     Status: None   Collection Time: 04/04/14  9:30 AM  Result Value Ref Range   Sodium 139 135 - 145 mmol/L   Potassium 4.1 3.5 - 5.1 mmol/L   Chloride 106 96 - 112 mEq/L   CO2 27 19 - 32 mmol/L   Glucose, Bld 98 70 - 99 mg/dL   BUN 19 6 - 23 mg/dL   Creatinine, Ser 4.09 0.50 - 1.10 mg/dL   Calcium 9.8 8.4 - 81.1 mg/dL   GFR calc non Af Amer >90 >90 mL/min   GFR calc Af Amer >90 >90 mL/min   Anion gap 6 5 - 15    Ct Head Wo Contrast  04/04/2014   CLINICAL DATA:  48 year old with severe headache for 2 weeks. History of migraines.  EXAM: CT HEAD WITHOUT CONTRAST  TECHNIQUE: Contiguous axial images were obtained from the base of the skull through the vertex without contrast.  COMPARISON:  11/08/2011  FINDINGS: No evidence for acute hemorrhage, mass lesion, midline shift, hydrocephalus or large infarct. No acute bone abnormality. Visualized  paranasal sinuses are clear.  IMPRESSION: Negative head CT.   Electronically Signed   By: Richarda Overlie M.D.   On: 04/04/2014 11:42    MDM  48 y.o. female with headache and nausea that has been off and on x 2 weeks but episode at work this morning that was sudden and severe. Stable for discharge without neuro deficits and much improved since arrival. Dr. Bebe Shaggy in to examine the patient and discuss CT findings and plan of care.   Final diagnoses:  Worst headache of life  Acute nonintractable headache, unspecified headache type      [facility], NP 04/04/14 1430  Joya Gaskins, MD 04/04/14 [phone]

## 2014-04-07 ENCOUNTER — Emergency Department (HOSPITAL_COMMUNITY)
Admission: EM | Admit: 2014-04-07 | Discharge: 2014-04-07 | Disposition: A | Discharge status: Home / Self Care | Payer: Managed Care, Other (non HMO) | Attending: Emergency Medicine | Admitting: Emergency Medicine

## 2014-04-07 ENCOUNTER — Encounter (HOSPITAL_COMMUNITY): Payer: Self-pay | Admitting: Emergency Medicine

## 2014-04-07 DIAGNOSIS — Z79899 Other long term (current) drug therapy: Secondary | ICD-10-CM | POA: Insufficient documentation

## 2014-04-07 DIAGNOSIS — F419 Anxiety disorder, unspecified: Secondary | ICD-10-CM | POA: Insufficient documentation

## 2014-04-07 DIAGNOSIS — G43909 Migraine, unspecified, not intractable, without status migrainosus: Secondary | ICD-10-CM

## 2014-04-07 DIAGNOSIS — E039 Hypothyroidism, unspecified: Secondary | ICD-10-CM | POA: Insufficient documentation

## 2014-04-07 DIAGNOSIS — Z880 Allergy status to penicillin: Secondary | ICD-10-CM | POA: Diagnosis not present

## 2014-04-07 DIAGNOSIS — Z87891 Personal history of nicotine dependence: Secondary | ICD-10-CM | POA: Insufficient documentation

## 2014-04-07 DIAGNOSIS — J449 Chronic obstructive pulmonary disease, unspecified: Secondary | ICD-10-CM | POA: Diagnosis not present

## 2014-04-07 DIAGNOSIS — M542 Cervicalgia: Secondary | ICD-10-CM | POA: Diagnosis not present

## 2014-04-07 MED ORDER — DIPHENHYDRAMINE HCL 50 MG/ML IJ SOLN
25.0000 mg | Freq: Once | INTRAMUSCULAR | Status: AC
  Administered 2014-04-07: 25 mg via INTRAVENOUS
  Filled 2014-04-07: qty 1

## 2014-04-07 MED ORDER — SUMATRIPTAN SUCCINATE 50 MG PO TABS: 50.0000 mg | ORAL_TABLET | ORAL | Status: DC | PRN

## 2014-04-07 MED ORDER — METOCLOPRAMIDE HCL 5 MG/ML IJ SOLN
10.0000 mg | Freq: Once | INTRAMUSCULAR | Status: AC
  Administered 2014-04-07: 10 mg via INTRAVENOUS
  Filled 2014-04-07: qty 2

## 2014-04-07 MED ORDER — KETOROLAC TROMETHAMINE 30 MG/ML IJ SOLN
30.0000 mg | Freq: Once | INTRAMUSCULAR | Status: AC
  Administered 2014-04-07: 30 mg via INTRAVENOUS
  Filled 2014-04-07: qty 1

## 2014-04-07 MED ORDER — OXYCODONE-ACETAMINOPHEN 5-325 MG PO TABS: 2.0000 | ORAL_TABLET | ORAL | Status: DC | PRN

## 2014-04-07 MED ORDER — PROMETHAZINE HCL 25 MG PO TABS: 25.0000 mg | ORAL_TABLET | Freq: Four times a day (QID) | ORAL | Status: DC | PRN

## 2014-04-07 MED ORDER — ONDANSETRON HCL 4 MG/2ML IJ SOLN
4.0000 mg | Freq: Once | INTRAMUSCULAR | Status: AC
  Administered 2014-04-07: 4 mg via INTRAVENOUS
  Filled 2014-04-07: qty 2

## 2014-04-07 MED ORDER — SODIUM CHLORIDE 0.9 % IV BOLUS (SEPSIS)
1000.0000 mL | Freq: Once | INTRAVENOUS | Status: AC
  Administered 2014-04-07: 1000 mL via INTRAVENOUS

## 2014-04-07 MED ORDER — HYDROMORPHONE HCL 1 MG/ML IJ SOLN
1.0000 mg | Freq: Once | INTRAMUSCULAR | Status: AC
  Administered 2014-04-07: 1 mg via INTRAVENOUS
  Filled 2014-04-07: qty 1

## 2014-04-07 NOTE — ED Notes (Signed)
Patient verbalizes understanding of discharge instructions, prescription medications, home care and follow up care if needed. Patient ambulatory out of department at this time with spouse. 

## 2014-04-07 NOTE — ED Notes (Signed)
Patient ambulatory to and from restroom, steady gait, no deficits noted. Husband with patient as escort.

## 2014-04-07 NOTE — ED Notes (Signed)
Patient complaining of migraine x 2 weeks. States she was seen here Sunday for same but is no better. [facility].

## 2014-04-07 NOTE — ED Notes (Signed)
Patient ambulatory to and from restroom without deficit. Husband escorting patient.

## 2014-04-07 NOTE — ED Provider Notes (Signed)
CSN: [id number]     Arrival date & time 04/07/14  1632 History  This chart was scribed for No att. providers found by [facility] Hashem, ED Scribe. The patient was seen in [facility] and the patient's care was started at 5:02 PM.  Chief Complaint  Patient presents with  . Migraine   HPI  HPI Comments: Melissa HaringCatherina W Zonia KiefStephens is a 48 y.o. female with a history of migraines who presents to the Emergency Department complaining of HA primarily on her forehead bilaterally, onset 3 weeks ago. Pt has neck pain as associated symptoms. Her migraines typically last 1 week. Pt was seen on here 3 days ago for the same complaint along with dizziness an unsteady feeling and weakness. Pt was given a cocktail of Benadryl 25 mg, Decadron 10 mg and Reglan 10 mg for minor relief. A CT scan on her head was also done, with no abnormal findings. Pt was seen by her PCP and given a shot of Toradol for no relief.   Past Medical History  Diagnosis Date  . Rectal discharge 07/28/2010  . Sluggishness 07/28/2010  . Cluster headaches   . COPD (chronic obstructive pulmonary disease)   . Asthma   . Hyperlipemia   . COPD (chronic obstructive pulmonary disease) with chronic bronchitis   . Pneumothorax   . Shortness of breath   . Hypothyroidism   . Anxiety   . Migraine    Past Surgical History  Procedure Laterality Date  . Abdominal hysterectomy    . Lobectomy      right lung   . Cholecystectomy    . Tubal ligation    . Colonoscopy N/A 07/30/2013    Procedure: COLONOSCOPY;  Surgeon: Malissa HippoNajeeb U Rehman, MD;  Location: [facility];  Service: Endoscopy;  Laterality: N/A;  930   Family History  Problem Relation Age of Onset  . Colon cancer Neg Hx    History  Substance Use Topics  . Smoking status: Former Smoker -- 1.00 packs/day for 30 years    Types: Cigarettes    Quit date: 02/21/2007  . Smokeless tobacco: Never Used  . Alcohol Use: No     Comment: no   OB History    No data available     Review of  Systems  Musculoskeletal: Positive for neck pain.  Neurological: Positive for headaches.  All other systems reviewed and are negative.   Allergies  Penicillins  Home Medications   Prior to Admission medications   Medication Sig Start Date End Date Taking? Authorizing Provider  acetaminophen (TYLENOL) 500 MG tablet Take 1,000 mg by mouth at bedtime.    Yes Historical Provider, MD  aspirin-acetaminophen-caffeine (EXCEDRIN MIGRAINE) [id number] MG per tablet Take 1 tablet by mouth every 6 (six) hours as needed for pain (Migraine).    Yes Historical Provider, MD  Calcium Carbonate-Vitamin D (CALCIUM 600 + D PO) Take 1 tablet by mouth 2 (two) times daily.   Yes Historical Provider, MD  cetirizine (ZYRTEC) 10 MG tablet Take 10 mg by mouth daily.   Yes Historical Provider, MD  citalopram (CELEXA) 20 MG tablet Take 20 mg by mouth at bedtime.   Yes Historical Provider, MD  levothyroxine (SYNTHROID, LEVOTHROID) 50 MCG tablet Take 50 mcg by mouth daily before breakfast.   Yes Historical Provider, MD  LORazepam (ATIVAN) 0.5 MG tablet Take 1 tablet (0.5 mg total) by mouth at bedtime as needed for anxiety. 03/21/12  Yes Michael L. Soo, MD  vitamin C (ASCORBIC ACID) 500 MG  tablet Take 500 mg by mouth daily.   Yes Historical Provider, MD  albuterol (VENTOLIN HFA) 108 (90 BASE) MCG/ACT inhaler Inhale 2 puffs into the lungs every 4 (four) hours as needed. For shortness of breath    Historical Provider, MD  butalbital-aspirin-caffeine [facility](FIORINAL) 50-325-40 MG per capsule Take 1-2 capsules by mouth every 4 (four) hours as needed for headache (Maximun of 6 capsules in a 24 hour period). Patient not taking: Reported on 04/07/2014 12/17/13   Burgess AmorJulie Idol, PA-C  HYDROcodone-acetaminophen (NORCO) 5-325 MG per tablet Take 1 tablet by mouth every 4 (four) hours as needed for pain. Patient not taking: Reported on 04/04/2014 07/28/12   Loren Raceravid Yelverton, MD  HYDROcodone-acetaminophen (NORCO/VICODIN) 5-325 MG per tablet Take 1  tablet by mouth every 4 (four) hours as needed. Patient not taking: Reported on 04/04/2014 12/17/13   Burgess AmorJulie Idol, PA-C  oxyCODONE-acetaminophen (PERCOCET) 5-325 MG per tablet Take 2 tablets by mouth every 4 (four) hours as needed. 04/07/14   Donnetta HutchingBrian Jamara Vary, MD  promethazine (PHENERGAN) 25 MG tablet Take 1 tablet (25 mg total) by mouth every 6 (six) hours as needed. 04/07/14   Donnetta HutchingBrian Xitlali Kastens, MD  SUMAtriptan (IMITREX) 50 MG tablet Take 1 tablet (50 mg total) by mouth every 2 (two) hours as needed for migraine or headache. May repeat in 2 hours if headache persists or recurs. 04/07/14   Donnetta HutchingBrian Valery Chance, MD   BP 106/68 mmHg  Pulse 66  Temp(Src) 99.1 F (37.3 C) (Oral)  Resp 18  Ht 5\' 4"  (1.626 m)  Wt 115 lb (52.164 kg)  BMI 19.73 kg/m2  SpO2 96% Physical Exam  Constitutional: She is oriented to person, place, and time. She appears well-developed and well-nourished.  Photophobic  HENT:  Head: Normocephalic and atraumatic.  Eyes: Conjunctivae and EOM are normal. Pupils are equal, round, and reactive to light.  Neck: Normal range of motion. Neck supple.  No meningeal signs  Cardiovascular: Normal rate and regular rhythm.   Pulmonary/Chest: Effort normal and breath sounds normal.  Abdominal: Soft. Bowel sounds are normal.  Musculoskeletal: Normal range of motion.  Neurological: She is alert and oriented to person, place, and time.  Skin: Skin is warm and dry.  Psychiatric: She has a normal mood and affect. Her behavior is normal.  Nursing note and vitals reviewed.   ED Course  Procedures  DIAGNOSTIC STUDIES: Oxygen Saturation is 100% on room air, normal by my interpretation.    COORDINATION OF CARE: 5:08 PM Discussed treatment plan with pt at bedside and pt agreed to plan.  Labs Review Labs Reviewed - No data to display  Imaging Review No results found.   EKG Interpretation None      MDM   Final diagnoses:  Migraine without status migrainosus, not intractable, unspecified migraine  type   Patient feels better after IV fluids, IV Reglan, IV Toradol, IV Benadryl.     Second liter of fluid plus IV Dilaudid also administered. No evidence of meningeal signs or neurological deficits. Discharge medications Imitrex 50 mg, Percocet, Phenergan 25 mg  I personally performed the services described in this documentation, which was scribed in my presence. The recorded information has been reviewed and is accurate.     Donnetta HutchingBrian Genifer Lazenby, MD 04/08/14 [phone]

## 2014-04-07 NOTE — Discharge Instructions (Signed)
Migraine Headache A migraine headache is an intense, throbbing pain on one or both sides of your head. A migraine can last for 30 minutes to several hours. CAUSES  The exact cause of a migraine headache is not always known. However, a migraine may be caused when nerves in the brain become irritated and release chemicals that cause inflammation. This causes pain. Certain things may also trigger migraines, such as:  Alcohol.  Smoking.  Stress.  Menstruation.  Aged cheeses.  Foods or drinks that contain nitrates, glutamate, aspartame, or tyramine.  Lack of sleep.  Chocolate.  Caffeine.  Hunger.  Physical exertion.  Fatigue.  Medicines used to treat chest pain (nitroglycerine), birth control pills, estrogen, and some blood pressure medicines. SIGNS AND SYMPTOMS  Pain on one or both sides of your head.  Pulsating or throbbing pain.  Severe pain that prevents daily activities.  Pain that is aggravated by any physical activity.  Nausea, vomiting, or both.  Dizziness.  Pain with exposure to bright lights, loud noises, or activity.  General sensitivity to bright lights, loud noises, or smells. Before you get a migraine, you may get warning signs that a migraine is coming (aura). An aura may include:  Seeing flashing lights.  Seeing bright spots, halos, or zigzag lines.  Having tunnel vision or blurred vision.  Having feelings of numbness or tingling.  Having trouble talking.  Having muscle weakness. DIAGNOSIS  A migraine headache is often diagnosed based on:  Symptoms.  Physical exam.  A CT scan or MRI of your head. These imaging tests cannot diagnose migraines, but they can help rule out other causes of headaches. TREATMENT Medicines may be given for pain and nausea. Medicines can also be given to help prevent recurrent migraines.  HOME CARE INSTRUCTIONS  Only take over-the-counter or prescription medicines for pain or discomfort as directed by your  health care provider. The use of long-term narcotics is not recommended.  Lie down in a dark, quiet room when you have a migraine.  Keep a journal to find out what may trigger your migraine headaches. For example, write down:  What you eat and drink.  How much sleep you get.  Any change to your diet or medicines.  Limit alcohol consumption.  Quit smoking if you smoke.  Get 7-9 hours of sleep, or as recommended by your health care provider.  Limit stress.  Keep lights dim if bright lights bother you and make your migraines worse. SEEK IMMEDIATE MEDICAL CARE IF:   Your migraine becomes severe.  You have a fever.  You have a stiff neck.  You have vision loss.  You have muscular weakness or loss of muscle control.  You start losing your balance or have trouble walking.  You feel faint or pass out.  You have severe symptoms that are different from your first symptoms. MAKE SURE YOU:   Understand these instructions.  Will watch your condition.  Will get help right away if you are not doing well or get worse. Document Released: 03/26/2005 Document Revised: 08/10/2013 Document Reviewed: 12/01/2012 [facility] Patient Information 2015 [employer] [place]. This information is not intended to replace advice given to you by your health care provider. Make sure you discuss any questions you have with your health care provider.   Prescriptions for Percocet, Phenergan, Imitrex. Follow-up your primary care doctor.

## 2014-04-07 NOTE — ED Notes (Addendum)
0.9% NSS bolus started on dayshift

## 2014-04-07 NOTE — ED Notes (Signed)
EDP at bedside  

## 2014-04-07 NOTE — ED Notes (Signed)
Patient states she "feels better and is ready to go home" EDP notified

## 2014-04-13 ENCOUNTER — Encounter: Payer: Self-pay | Admitting: Neurology

## 2014-04-13 ENCOUNTER — Ambulatory Visit (INDEPENDENT_AMBULATORY_CARE_PROVIDER_SITE_OTHER): Payer: Managed Care, Other (non HMO) | Admitting: Neurology

## 2014-04-13 VITALS — BP 120/60 | HR 78 | Temp 98.4°F | Resp 18 | Ht 64.0 in | Wt 120.3 lb

## 2014-04-13 DIAGNOSIS — G4441 Drug-induced headache, not elsewhere classified, intractable: Secondary | ICD-10-CM

## 2014-04-13 DIAGNOSIS — G43719 Chronic migraine without aura, intractable, without status migrainosus: Secondary | ICD-10-CM

## 2014-04-13 DIAGNOSIS — G4440 Drug-induced headache, not elsewhere classified, not intractable: Secondary | ICD-10-CM

## 2014-04-13 MED ORDER — NORTRIPTYLINE HCL 10 MG PO CAPS: 10.0000 mg | ORAL_CAPSULE | Freq: Every day | ORAL | Status: DC

## 2014-04-13 MED ORDER — SUMATRIPTAN SUCCINATE 100 MG PO TABS: ORAL_TABLET | ORAL | Status: DC

## 2014-04-13 MED ORDER — PREDNISONE 10 MG PO TABS: ORAL_TABLET | ORAL | Status: DC

## 2014-04-13 NOTE — Progress Notes (Signed)
NEUROLOGY CONSULTATION NOTE  GREG ECKRICH MRN: [id number] DOB: February 25, 1966  Referring provider: Dr. Hassan Rowan Primary care provider: Dr. Fuller Canada  Reason for consult:  Headache  HISTORY OF PRESENT ILLNESS: [name] is a 49 year old right-handed woman with COPD, hyperlipidemia, asthma who presents for headache.  Records, labs, prior MRI of brain, and recent CT of head reviewed.  She previously was followed by Dr. Smiley Houseman.  She is accompanied by her mother who provides some history.  She has had recent worsening of her migraines, requiring two visits to the ED a couple of weeks ago.  They are daily but have been worse over the past 4 weeks.  These are a little different as she has diffuse stabbing pain as well as brief episodes of dizziness (not necessarily related to the headache), and associated episodes of feeling of heaviness and tingling in the arms and neck (no radicular pain).  She had a CT of the head performed on 04/04/14, which was unremarkable.  Usual migraines: Onset:  2006-2007 Location:  Varies:  Back of neck, bi-frontal. Quality:  pounding Intensity:  4/10 (10/10 when severe) Aura:  no Prodrome:  no Associated symptoms:  Nausea, vomiting, diarrhea, photophobia, phonophobia, osmophobia, blurred vision Duration:  Daily headache but when severe, usually lasts 2 weeks Frequency:  daily Triggers/exacerbating factors:  unknown Relieving factors:  none Activity:  Almost unbearable 15 days out of the month but forces self to go to work.  Past abortive therapy:  Maxalt, ibuprofen, naproxen (GI upset), Tylenol Past preventative therapy:  none  Current abortive therapy:  Excedrin migraine (takes daily for many years), sumatriptan  (takes edge off), Fiorinal, Percocet Current preventative therapy:  none Other medications:  citalopram , levothyroxine  She had an MRI of the brain without contrast performed on 05/22/2011, which showed non-specific scattered  foci of primarily subcortical white matter signal in the frontal and parietal regions.  Caffeine:  Coffee and coke daily Alcohol:  no Smoker:  no Diet:  Stopped bananas and chocolate.  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  No first degree relatives with cerebral aneurysm  PAST MEDICAL HISTORY: Past Medical History  Diagnosis Date  . Rectal discharge 07/28/2010  . Sluggishness 07/28/2010  . Cluster headaches   . COPD (chronic obstructive pulmonary disease)   . Asthma   . Hyperlipemia   . COPD (chronic obstructive pulmonary disease) with chronic bronchitis   . Pneumothorax   . Shortness of breath   . Hypothyroidism   . Anxiety   . Migraine     PAST SURGICAL HISTORY: Past Surgical History  Procedure Laterality Date  . Abdominal hysterectomy    . Lobectomy      right lung   . Cholecystectomy    . Tubal ligation    . Colonoscopy N/A 07/30/2013    Procedure: COLONOSCOPY;  Surgeon: Malissa Hippo, MD;  Location: [facility];  Service: Endoscopy;  Laterality: N/A;  930    MEDICATIONS: Current Outpatient Prescriptions on File Prior to Visit  Medication Sig Dispense Refill  . acetaminophen (TYLENOL) 500 MG tablet Take 1,000 mg by mouth at bedtime.     Marland Kitchen albuterol (VENTOLIN HFA) 108 (90 BASE) MCG/ACT inhaler Inhale 2 puffs into the lungs every 4 (four) hours as needed. For shortness of breath    . aspirin-acetaminophen-caffeine (EXCEDRIN  MIGRAINE) 250-250-65 MG per tablet Take 1 tablet by mouth every 6 (six) hours as needed for pain (Migraine).     . butalbital-aspirin-caffeine (FIORINAL) 50-325-40 MG per capsule Take 1-2 capsules by mouth every 4 (four) hours as needed for headache (Maximun of 6 capsules in a 24 hour period). 20 capsule 0  . Calcium Carbonate-Vitamin D (CALCIUM 600 + D PO) Take 1 tablet  by mouth 2 (two) times daily.    . cetirizine (ZYRTEC) 10 MG tablet Take 10 mg by mouth daily.    . citalopram (CELEXA) 20 MG tablet Take 20 mg by mouth at bedtime.    Marland Kitchen levothyroxine (SYNTHROID, LEVOTHROID) 50 MCG tablet Take 50 mcg by mouth daily before breakfast.    . LORazepam (ATIVAN) 0.5 MG tablet Take 1 tablet (0.5 mg total) by mouth at bedtime as needed for anxiety. 30 tablet 1  . oxyCODONE-acetaminophen (PERCOCET) 5-325 MG per tablet Take 2 tablets by mouth every 4 (four) hours as needed. 20 tablet 0  . promethazine (PHENERGAN) 25 MG tablet Take 1 tablet (25 mg total) by mouth every 6 (six) hours as needed. 20 tablet 0  . vitamin C (ASCORBIC ACID) 500 MG tablet Take 500 mg by mouth daily.    Marland Kitchen HYDROcodone-acetaminophen (NORCO) 5-325 MG per tablet Take 1 tablet by mouth every 4 (four) hours as needed for pain. (Patient not taking: Reported on 04/04/2014) 10 tablet 0  . HYDROcodone-acetaminophen (NORCO/VICODIN) 5-325 MG per tablet Take 1 tablet by mouth every 4 (four) hours as needed. (Patient not taking: Reported on 04/04/2014) 10 tablet 0   No current facility-administered medications on file prior to visit.    ALLERGIES: Allergies  Allergen Reactions  . Penicillins Other (See Comments)    Childhood Reaction     FAMILY HISTORY: Family History  Problem Relation Age of Onset  . Colon cancer Neg Hx   . COPD Mother   . Diabetes Mother   . Hyperlipidemia Mother   . Hypertension Mother   . Bipolar disorder Son   . Heart failure Maternal Grandmother   . Hypertension Maternal Grandmother   . Thyroid disease Maternal Grandmother   . Diabetes Paternal Grandmother   . Alzheimer's disease Paternal Grandmother   . Heart failure Paternal Grandfather   . Hypertension Paternal Grandfather     SOCIAL HISTORY: History   Social History  . Marital Status: Married    Spouse Name: N/A    Number of Children: N/A  . Years of Education: N/A   Occupational History  . Not on file.    Social History Main Topics  . Smoking status: Former Smoker -- 1.00 packs/day for 30 years    Types: Cigarettes    Quit date: 02/21/2007  . Smokeless tobacco: Never Used  . Alcohol Use: No     Comment: no  . Drug Use: No  . Sexual Activity: No   Other Topics Concern  . Not on file   Social History Narrative    REVIEW OF SYSTEMS: Constitutional: No fevers, chills, or sweats, no generalized fatigue, change in appetite Eyes: No visual changes, double vision, eye pain Ear, nose and throat: No hearing loss, ear pain, nasal congestion, sore throat Cardiovascular: No chest pain, palpitations Respiratory:  No shortness of breath at rest or with exertion, wheezes GastrointestinaI: No nausea, vomiting, diarrhea, abdominal pain, fecal incontinence Genitourinary:  No dysuria, urinary retention or frequency Musculoskeletal:  No neck pain, back pain Integumentary: No rash, pruritus, skin lesions Neurological: as above Psychiatric: No  depression, insomnia, anxiety Endocrine: No palpitations, fatigue, diaphoresis, mood swings, change in appetite, change in weight, increased thirst Hematologic/Lymphatic:  No anemia, purpura, petechiae. Allergic/Immunologic: no itchy/runny eyes, nasal congestion, recent allergic reactions, rashes  PHYSICAL EXAM: Filed Vitals:   04/13/14 0822  BP: 120/60  Pulse: 78  Temp: 98.4 F (36.9 C)  Resp: 18   General: No acute distress Head:  Normocephalic/atraumatic Eyes:  fundi unremarkable, without vessel changes, exudates, hemorrhages or papilledema. Neck: supple, no paraspinal tenderness, full range of motion Back: No paraspinal tenderness Heart: regular rate and rhythm Lungs: Clear to auscultation bilaterally. Vascular: No carotid bruits. Neurological Exam: Mental status: alert and oriented to person, place, and time, recent and remote memory intact, fund of knowledge intact, attention and concentration intact, speech fluent and not dysarthric,  language intact. Cranial nerves: CN I: not tested CN II: pupils equal, round and reactive to light, visual fields intact, fundi unremarkable, without vessel changes, exudates, hemorrhages or papilledema. CN III, IV, VI:  full range of motion, no nystagmus, no ptosis CN V: facial sensation intact CN VII: upper and lower face symmetric CN VIII: hearing intact CN IX, X: gag intact, uvula midline CN XI: sternocleidomastoid and trapezius muscles intact CN XII: tongue midline Bulk & Tone: normal, no fasciculations. Motor:  5/5 throughout Sensation:  Pinprick and vibration intact Deep Tendon Reflexes:  2+ and symmetric in upper extremities.  3+ with non-sustained clonus and symmetric in lower extremities (chronic), toes downgoing. Finger to nose testing:  No dysmetria Heel to shin:  No dysmetria Gait:  Normal station and stride.  Able to turn and walk in tandem. Romberg negative.  IMPRESSION: Chronic migraine, intractable Medication overuse headache  PLAN: 1.  Start nortriptyline 10mg  at bedtime 2.  Start prednisone taper.   3.  For acute headache, try sumatriptan 100mg  pill along with ibuprofen 400mg  at earliest onset of headache. 4.  Stop caffeine and soda 5.  Stop excedrin.  You should not be taking pain relievers more than 2 days out of the week. 6.  Start routine exercise 7.  Increase water intake 8.  Follow sleep hygiene sheet. 9.  Follow up in  3 months.  But call in 4 weeks with update and we can adjust medication if needed.  Thank you for allowing me to take part in the care of this patient.  Shon MilletAdam Jaffe, DO  CC:  Theodis ShoveJunell Koberlein, MD

## 2014-04-13 NOTE — Patient Instructions (Addendum)
1.  Start nortriptyline 10mg  at bedtime 2.  Start prednisone taper.  Take 6tabs x1day, then 5tabs x1day, then 4tabs x1day, then 3tabs x1day, then 2tabs x1day, then 1tab x1day, then STOP.  Do not take ibuprofen while on this. 3.  For acute headache, try sumatriptan 100mg  pill along with ibuprofen 400mg  at earliest onset of headache.  May repeat sumatriptan (not ibuprofen) in 2 hours if needed.  Do not exceed two sumatriptan in 24 hours 4.  Stop caffeine and soda, Percocet and Fiorinal 5.  Stop excedrin.  You should not be taking pain relievers more than 2 days out of the week. 6.  Start routine exercise 7.  Increase water intake 8.  Follow sleep hygiene sheet. 9.  Follow up in  3 months.  But call in 4 weeks with update and we can adjust medication if needed.

## 2014-04-16 ENCOUNTER — Telehealth: Payer: Self-pay | Admitting: *Deleted

## 2014-04-16 NOTE — Telephone Encounter (Signed)
Patient following up on a prior auth for a medication  Call back number [phone]

## 2014-04-16 NOTE — Telephone Encounter (Signed)
Patient is aware that through cover my meds Berkley Harveyauth has been submitted

## 2014-04-19 ENCOUNTER — Telehealth: Payer: Self-pay | Admitting: *Deleted

## 2014-04-19 ENCOUNTER — Emergency Department (HOSPITAL_COMMUNITY)
Admission: EM | Admit: 2014-04-19 | Discharge: 2014-04-19 | Disposition: A | Discharge status: Home / Self Care | Payer: Managed Care, Other (non HMO) | Attending: Emergency Medicine | Admitting: Emergency Medicine

## 2014-04-19 ENCOUNTER — Telehealth: Payer: Self-pay | Admitting: Neurology

## 2014-04-19 ENCOUNTER — Encounter (HOSPITAL_COMMUNITY): Payer: Self-pay | Admitting: *Deleted

## 2014-04-19 DIAGNOSIS — J449 Chronic obstructive pulmonary disease, unspecified: Secondary | ICD-10-CM | POA: Insufficient documentation

## 2014-04-19 DIAGNOSIS — Z8639 Personal history of other endocrine, nutritional and metabolic disease: Secondary | ICD-10-CM | POA: Insufficient documentation

## 2014-04-19 DIAGNOSIS — F419 Anxiety disorder, unspecified: Secondary | ICD-10-CM | POA: Diagnosis not present

## 2014-04-19 DIAGNOSIS — Z880 Allergy status to penicillin: Secondary | ICD-10-CM | POA: Diagnosis not present

## 2014-04-19 DIAGNOSIS — E039 Hypothyroidism, unspecified: Secondary | ICD-10-CM | POA: Diagnosis not present

## 2014-04-19 DIAGNOSIS — Z87891 Personal history of nicotine dependence: Secondary | ICD-10-CM | POA: Diagnosis not present

## 2014-04-19 DIAGNOSIS — G43909 Migraine, unspecified, not intractable, without status migrainosus: Secondary | ICD-10-CM | POA: Insufficient documentation

## 2014-04-19 DIAGNOSIS — Z79899 Other long term (current) drug therapy: Secondary | ICD-10-CM | POA: Insufficient documentation

## 2014-04-19 DIAGNOSIS — Z7952 Long term (current) use of systemic steroids: Secondary | ICD-10-CM | POA: Diagnosis not present

## 2014-04-19 DIAGNOSIS — G43009 Migraine without aura, not intractable, without status migrainosus: Secondary | ICD-10-CM

## 2014-04-19 MED ORDER — KETOROLAC TROMETHAMINE 30 MG/ML IJ SOLN
30.0000 mg | Freq: Once | INTRAMUSCULAR | Status: AC
  Administered 2014-04-19: 30 mg via INTRAVENOUS
  Filled 2014-04-19: qty 1

## 2014-04-19 MED ORDER — METOCLOPRAMIDE HCL 5 MG/ML IJ SOLN
10.0000 mg | Freq: Once | INTRAMUSCULAR | Status: AC
  Administered 2014-04-19: 10 mg via INTRAVENOUS
  Filled 2014-04-19: qty 2

## 2014-04-19 MED ORDER — DEXAMETHASONE SODIUM PHOSPHATE 4 MG/ML IJ SOLN
10.0000 mg | Freq: Once | INTRAMUSCULAR | Status: AC
  Administered 2014-04-19: 10 mg via INTRAVENOUS
  Filled 2014-04-19: qty 3

## 2014-04-19 MED ORDER — SODIUM CHLORIDE 0.9 % IV BOLUS (SEPSIS)
1000.0000 mL | Freq: Once | INTRAVENOUS | Status: AC
  Administered 2014-04-19: 1000 mL via INTRAVENOUS

## 2014-04-19 MED ORDER — DIPHENHYDRAMINE HCL 50 MG/ML IJ SOLN
50.0000 mg | Freq: Once | INTRAMUSCULAR | Status: AC
  Administered 2014-04-19: 50 mg via INTRAVENOUS
  Filled 2014-04-19: qty 1

## 2014-04-19 MED ORDER — SUMATRIPTAN SUCCINATE 6 MG/0.5ML ~~LOC~~ SOLN
6.0000 mg | Freq: Once | SUBCUTANEOUS | Status: AC
Start: 2014-04-19 — End: 2014-04-19
  Administered 2014-04-19: 6 mg via SUBCUTANEOUS
  Filled 2014-04-19: qty 0.5

## 2014-04-19 NOTE — Telephone Encounter (Signed)
Patient was advised to go to Ed if headache is still hurting after completing prednisone taper her SUMAtriptan (IMITREX) 100 MG tablet # 9 was approved but cannot pick up until 05/20/14 this is good for 1 year . She stated the Ed advised her to take 4 at a time

## 2014-04-19 NOTE — ED Notes (Signed)
Headache for 5 weeks, nausea, no vomiting.

## 2014-04-19 NOTE — Telephone Encounter (Signed)
Pt needs to talk to you again please call [phone]

## 2014-04-19 NOTE — Telephone Encounter (Signed)
patient called stating headache is no better 2 days of predinsone left [facility]  yet it isto early seh pick it up 04/10/14 please advise

## 2014-04-19 NOTE — ED Provider Notes (Signed)
TIME SEEN: 7:25 PM  CHIEF COMPLAINT: Migraine  HPI: Pt is a 49 y.o. F with history of migraine headaches, hyperlipidemia, COPD who presents to the emergency department with a waxing and waning diffuse throbbing frontal headache that is similar to her prior migraines that has been present for the past 5 weeks. She states that this feels similar to her prior migraines but they have never lasted this long. She reports that in the past ibuprofen, Excedrin have helped with her migraines. She states then she was started on Imitrex and then change to Maxalt when that stopped working. She states she has tried narcotics without relief. She was seen by Dr. Everlena CooperJaffe with neurology recently and he recommended that she stop all of these rescue medications and avoid caffeine. She has been on prednisone and finished her last dose today. States she called the neurologist office today who instructed her to come to the emergency department to get a migraine cocktail. She states that she does currently have a prescription for Imitrex prescribed by her neurologist that she has been trying at home with some relief but her headache has never completely resolved. She has photophobia, phonophobia, nausea but no vomiting. No head injury, neck pain or neck stiffness. No fever. No numbness, tingling or focal weakness. Patient has been in the emergency department twice in the past week for similar symptoms without resolution of the headache but did have improvement. She had a negative head CT on 04/04/14.  ROS: See HPI Constitutional: no fever  Eyes: no drainage  ENT: no runny nose   Cardiovascular:  no chest pain  Resp: no SOB  GI: no vomiting GU: no dysuria Integumentary: no rash  Allergy: no hives  Musculoskeletal: no leg swelling  Neurological: no slurred speech ROS otherwise negative  PAST MEDICAL HISTORY/PAST SURGICAL HISTORY:  Past Medical History  Diagnosis Date  . Rectal discharge 07/28/2010  . Sluggishness  07/28/2010  . Cluster headaches   . COPD (chronic obstructive pulmonary disease)   . Asthma   . Hyperlipemia   . COPD (chronic obstructive pulmonary disease) with chronic bronchitis   . Pneumothorax   . Shortness of breath   . Hypothyroidism   . Anxiety   . Migraine     MEDICATIONS:  Prior to Admission medications   Medication Sig Start Date End Date Taking? Authorizing Provider  acetaminophen (TYLENOL) 500 MG tablet Take 1,000 mg by mouth at bedtime.     Historical Provider, MD  albuterol (VENTOLIN HFA) 108 (90 BASE) MCG/ACT inhaler Inhale 2 puffs into the lungs every 4 (four) hours as needed. For shortness of breath    Historical Provider, MD  aspirin-acetaminophen-caffeine (EXCEDRIN MIGRAINE) [phone] MG per tablet Take 1 tablet by mouth every 6 (six) hours as needed for pain (Migraine).     Historical Provider, MD  butalbital-aspirin-caffeine [facility](FIORINAL) 50-325-40 MG per capsule Take 1-2 capsules by mouth every 4 (four) hours as needed for headache (Maximun of 6 capsules in a 24 hour period). 12/17/13   Burgess AmorJulie Idol, PA-C  Calcium Carbonate-Vitamin D (CALCIUM 600 + D PO) Take 1 tablet by mouth 2 (two) times daily.    Historical Provider, MD  cetirizine (ZYRTEC) 10 MG tablet Take 10 mg by mouth daily.    Historical Provider, MD  citalopram (CELEXA) 20 MG tablet Take 20 mg by mouth at bedtime.    Historical Provider, MD  HYDROcodone-acetaminophen (NORCO) 5-325 MG per tablet Take 1 tablet by mouth every 4 (four) hours as needed for pain. Patient  not taking: Reported on 04/04/2014 07/28/12   [name], MD  HYDROcodone-acetaminophen (NORCO/VICODIN) 5-325 MG per tablet Take 1 tablet by mouth every 4 (four) hours as needed. Patient not taking: Reported on 04/04/2014 12/17/13   Burgess Amor, PA-C  levothyroxine (SYNTHROID, LEVOTHROID) 50 MCG tablet Take 50 mcg by mouth daily before breakfast.    Historical Provider, MD  LORazepam (ATIVAN) 0.5 MG tablet Take 1 tablet (0.5 mg total) by mouth at  bedtime as needed for anxiety. 03/21/12   Casimiro Needle L. Soo, MD  nortriptyline (PAMELOR) 10 MG capsule Take 1 capsule (10 mg total) by mouth at bedtime. 04/13/14   Adam Gus Rankin, DO  oxyCODONE-acetaminophen (PERCOCET) 5-325 MG per tablet Take 2 tablets by mouth every 4 (four) hours as needed. 04/07/14   Donnetta Hutching, MD  predniSONE (DELTASONE) 10 MG tablet Take 6tabs x1day, then 5tabs x1day, then 4tabs x1day, then 3tabs x1day, then 2tabs x1day, then 1tab x1day, then STOP 04/13/14   Cira Servant, DO  promethazine (PHENERGAN) 25 MG tablet Take 1 tablet (25 mg total) by mouth every 6 (six) hours as needed. 04/07/14   Donnetta Hutching, MD  SUMAtriptan (IMITREX) 100 MG tablet Take 1tab at earliest onset of headache.  May repeat x1 in 2 hours if headache persists or recurs. 04/13/14   Adam Gus Rankin, DO  vitamin C (ASCORBIC ACID) 500 MG tablet Take 500 mg by mouth daily.    Historical Provider, MD    ALLERGIES:  Allergies  Allergen Reactions  . Penicillins Other (See Comments)    Childhood Reaction     SOCIAL HISTORY:  History  Substance Use Topics  . Smoking status: Former Smoker -- 1.00 packs/day for 30 years    Types: Cigarettes    Quit date: 02/21/2007  . Smokeless tobacco: Never Used  . Alcohol Use: No     Comment: no    FAMILY HISTORY: Family History  Problem Relation Age of Onset  . Colon cancer Neg Hx   . COPD Mother   . Diabetes Mother   . Hyperlipidemia Mother   . Hypertension Mother   . Bipolar disorder Son   . Heart failure Maternal Grandmother   . Hypertension Maternal Grandmother   . Thyroid disease Maternal Grandmother   . Diabetes Paternal Grandmother   . Alzheimer's disease Paternal Grandmother   . Heart failure Paternal Grandfather   . Hypertension Paternal Grandfather     EXAM: BP 111/75 mmHg  Pulse 87  Temp(Src) 98.6 F (37 C) (Oral)  Resp 18  Ht  (1.626 m)  Wt 120 lb (54.432 kg)  BMI 20.59 kg/m2  SpO2 100% CONSTITUTIONAL: Alert and oriented and  responds appropriately to questions. Patient appears uncomfortable but is nontoxic and in no distress HEAD: Normocephalic EYES: Conjunctivae clear, PERRL ENT: normal nose; no rhinorrhea; moist mucous membranes; pharynx without lesions noted NECK: Supple, no meningismus, no LAD  CARD: RRR; S1 and S2 appreciated; no murmurs, no clicks, no rubs, no gallops RESP: Normal chest excursion without splinting or tachypnea; breath sounds clear and equal bilaterally; no wheezes, no rhonchi, no rales ABD/GI: Normal bowel sounds; non-distended; soft, non-tender, no rebound, no guarding BACK:  The back appears normal and is non-tender to palpation, there is no CVA tenderness EXT: Normal ROM in all joints; non-tender to palpation; no edema; normal capillary refill; no cyanosis    SKIN: Normal color for age and race; warm NEURO: Moves all extremities equally, sensation to light touch intact diffusely, cranial nerves II through XII  intact, normal gait PSYCH: The patient's mood and manner are appropriate. Grooming and personal hygiene are appropriate.  MEDICAL DECISION MAKING: Patient here with her typical migraine headache that is however lasted for 5 weeks. She is requesting a migraine cocktail at the request of her neurologist. Will give 12, Reglan, Benadryl, IV fluids and Decadron and reassess. Doubt subarachnoid hemorrhage given patient reports this is similar to her prior chronic headaches. She is neurologically intact with no history of head injury. She is afebrile without meningismus.  ED PROGRESS: Patient reports her headache is now 5/10 after migraine cocktail. We'll give dose of subcutaneous Imitrex and reassess.   10:00 PM  Pt reports her migraine headache is completely gone and her pain is 0/10. Have advised her to follow-up with Dr. Everlena Cooper with neurology as scheduled and to discuss further treatment options with him since he is trying to wean her off multiple rescue medications. Discussed return  precautions. She verbalized understanding and is comfortable with plan.  Layla Maw Ward, DO 04/19/14 2208

## 2014-04-19 NOTE — Discharge Instructions (Signed)
Recurrent Migraine Headache A migraine headache is an intense, throbbing pain on one or both sides of your head. Recurrent migraines keep coming back. A migraine can last for 30 minutes to several hours. CAUSES  The exact cause of a migraine headache is not always known. However, a migraine may be caused when nerves in the brain become irritated and release chemicals that cause inflammation. This causes pain. Certain things may also trigger migraines, such as:   Alcohol.  Smoking.  Stress.  Menstruation.  Aged cheeses.  Foods or drinks that contain nitrates, glutamate, aspartame, or tyramine.  Lack of sleep.  Chocolate.  Caffeine.  Hunger.  Physical exertion.  Fatigue.  Medicines used to treat chest pain (nitroglycerine), birth control pills, estrogen, and some blood pressure medicines. SYMPTOMS   Pain on one or both sides of your head.  Pulsating or throbbing pain.  Severe pain that prevents daily activities.  Pain that is aggravated by any physical activity.  Nausea, vomiting, or both.  Dizziness.  Pain with exposure to bright lights, loud noises, or activity.  General sensitivity to bright lights, loud noises, or smells. Before you get a migraine, you may get warning signs that a migraine is coming (aura). An aura may include:  Seeing flashing lights.  Seeing bright spots, halos, or zigzag lines.  Having tunnel vision or blurred vision.  Having feelings of numbness or tingling.  Having trouble talking.  Having muscle weakness. DIAGNOSIS  A recurrent migraine headache is often diagnosed based on:  Symptoms.  Physical examination.  A CT scan or MRI of your head. These imaging tests cannot diagnose migraines but can help rule out other causes of headaches.  TREATMENT  Medicines may be given for pain and nausea. Medicines can also be given to help prevent recurrent migraines. HOME CARE INSTRUCTIONS  Only take over-the-counter or prescription  medicines for pain or discomfort as directed by your health care provider. The use of long-term narcotics is not recommended.  Lie down in a dark, quiet room when you have a migraine.  Keep a journal to find out what may trigger your migraine headaches. For example, write down:  What you eat and drink.  How much sleep you get.  Any change to your diet or medicines.  Limit alcohol consumption.  Quit smoking if you smoke.  Get 7-9 hours of sleep, or as recommended by your health care provider.  Limit stress.  Keep lights dim if bright lights bother you and make your migraines worse. SEEK MEDICAL CARE IF:   You do not get relief from the medicines given to you.  You have a recurrence of pain.  You have a fever. SEEK IMMEDIATE MEDICAL CARE IF:  Your migraine becomes severe.  You have a stiff neck.  You have loss of vision.  You have muscular weakness or loss of muscle control.  You start losing your balance or have trouble walking.  You feel faint or pass out.  You have severe symptoms that are different from your first symptoms. MAKE SURE YOU:   Understand these instructions.  Will watch your condition.  Will get help right away if you are not doing well or get worse. Document Released: 12/19/2000 Document Revised: 08/10/2013 Document Reviewed: 12/01/2012 Exit[employer] Patient Information 2015 [employer]. This information is not intended to replace advice given to you by your health care provider. Make sure you discuss any questions you have with your health care provider.  

## 2014-05-12 ENCOUNTER — Telehealth: Payer: Self-pay | Admitting: Neurology

## 2014-05-12 ENCOUNTER — Telehealth: Payer: Self-pay | Admitting: *Deleted

## 2014-05-12 ENCOUNTER — Other Ambulatory Visit (HOSPITAL_COMMUNITY): Payer: Self-pay | Admitting: Family Medicine

## 2014-05-12 DIAGNOSIS — Z1231 Encounter for screening mammogram for malignant neoplasm of breast: Secondary | ICD-10-CM

## 2014-05-12 NOTE — Telephone Encounter (Signed)
She has stopped eeverything you ask her to caffiene and soda is drinking more water and sleep is good. However she is asking can she have a caffiene free soda once a week  And does she need to increase her medication at this time or give it a bit longer ? Please advise

## 2014-05-12 NOTE — Telephone Encounter (Signed)
She can have a caffeine free soda once a week.  Looking back at the past month, how many headaches did she have?  If she feels they are still frequent enough, we can increase nortriptyline to 25mg  at bedtime.

## 2014-05-12 NOTE — Telephone Encounter (Signed)
Pt is calling for her 4 week chcek in she states that she is still having a headache please call [phone]

## 2014-05-13 ENCOUNTER — Telehealth: Payer: Self-pay | Admitting: *Deleted

## 2014-05-13 NOTE — Telephone Encounter (Signed)
She can have a caffeine free soda once a week.  Looking back at the past month, how many headaches did she have?  If she feels they are still frequent enough, we can increase nortriptyline to 25mg  at bedtime.. Patient states she has the headache just about everyday but it is not a bad headache just nagging one and does not go away completely she has just picked up her new prescription for the nortriptyline she is going to try taking 2 at bedtime and will call in 1 week to let us know how she is doing

## 2014-05-18 NOTE — Telephone Encounter (Signed)
See Susie's 05/12/14 and 05/13/14 telephone encounter (final documentation entered on separate phone encounter) / Sherri S.

## 2014-05-27 ENCOUNTER — Other Ambulatory Visit: Payer: Self-pay | Admitting: *Deleted

## 2014-05-27 ENCOUNTER — Telehealth: Payer: Self-pay | Admitting: Neurology

## 2014-05-27 ENCOUNTER — Other Ambulatory Visit: Payer: Self-pay | Admitting: Neurology

## 2014-05-27 DIAGNOSIS — G43009 Migraine without aura, not intractable, without status migrainosus: Secondary | ICD-10-CM

## 2014-05-27 MED ORDER — NORTRIPTYLINE HCL 25 MG PO CAPS: 25.0000 mg | ORAL_CAPSULE | Freq: Every day | ORAL | Status: DC

## 2014-05-27 NOTE — Telephone Encounter (Signed)
Patient is aware that a increase in Nortriptyline to 25 mg 1 po hs has been called to Pharmacy as well as refills on Imitrex  She was advised to contact office in 4 weeks to let us know how she is doing

## 2014-05-27 NOTE — Telephone Encounter (Signed)
Pt called stating that she is suppose to call in two weeks to give a update on the script for NORTRIPTYLINE. Pt stated that she is still having headaches. If you call her before 11AM you can reach her at # [phone] If it's after 11AM. 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

## 2014-05-27 NOTE — Telephone Encounter (Signed)
     Expand All Collapse All   Pt called stating that she is suppose to call in two weeks to give a update on the script for NORTRIPTYLINE.  Pt stated that she is still having headaches. If you call her before 11AM you can reach her at # [phone]  If it's after 11AM. 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   Please advise .

## 2014-05-27 NOTE — Telephone Encounter (Signed)
She can increase nortriptyline to 25mg  at bedtime.  She should update us in 4 weeks and we can make further adjustments if needed.

## 2014-06-14 ENCOUNTER — Ambulatory Visit (HOSPITAL_COMMUNITY)
Admission: RE | Admit: 2014-06-14 | Discharge: 2014-06-14 | Disposition: A | Discharge status: Home / Self Care | Payer: Managed Care, Other (non HMO) | Source: Ambulatory Visit | Attending: Family Medicine | Admitting: Family Medicine

## 2014-06-14 ENCOUNTER — Telehealth: Payer: Self-pay | Admitting: *Deleted

## 2014-06-14 DIAGNOSIS — Z1231 Encounter for screening mammogram for malignant neoplasm of breast: Secondary | ICD-10-CM | POA: Diagnosis not present

## 2014-06-14 NOTE — Telephone Encounter (Signed)
Patient called to state the [place] is still not working it has been only 17 days since the increase I advised her to continue to take the medication as she was and to call back on the 18 of March she really needs to give it the full 4 weeks to work

## 2014-06-29 ENCOUNTER — Telehealth: Payer: Self-pay | Admitting: *Deleted

## 2014-06-29 NOTE — Telephone Encounter (Signed)
Patient called stating she has had this same headache since January and it is not any better . I advised her to go to ED if she was getting no relief from her medication . She is asking can she get a prescription for Imitrex injections because the pills are not working at all  ? Please advise

## 2014-06-30 ENCOUNTER — Other Ambulatory Visit: Payer: Self-pay | Admitting: *Deleted

## 2014-06-30 ENCOUNTER — Telehealth: Payer: Self-pay | Admitting: Neurology

## 2014-06-30 DIAGNOSIS — G43009 Migraine without aura, not intractable, without status migrainosus: Secondary | ICD-10-CM

## 2014-06-30 MED ORDER — SUMATRIPTAN SUCCINATE 6 MG/0.5ML ~~LOC~~ SOAJ
6.0000 mg | Freq: Once | SUBCUTANEOUS | Status: DC
Start: 2014-06-30 — End: 2014-07-13

## 2014-06-30 NOTE — Telephone Encounter (Signed)
I spoke with patient Imitrex injection Imitrex 6mg  [place] injections. She can inject once at earliest onset of headache and repeat once in 2 hours if needed. She should not exceed two injections in a 24 hour period.was called into pharmacy . With 3 refills however patient will need to come to office to instruction on how to give as pharmacy does not instruct

## 2014-06-30 NOTE — Telephone Encounter (Signed)
We can prescribe her Imitrex 6mg  [place] injections.  She can inject once at earliest onset of headache and repeat once in 2 hours if needed.  She should not exceed two injections in a 24 hour period.  She should not be taking any pain relievers more than 2 days out of the week.

## 2014-06-30 NOTE — Telephone Encounter (Signed)
I don't think she needs an MRI

## 2014-06-30 NOTE — Telephone Encounter (Signed)
I spoke with this patient she is going to contact insurance and see if they will cover Imitrex injections and call the office back if she wants to me to call in to pharmacy. She is also asking if Dr Everlena CooperJaffe feels she needs to have a MRI of Brain done ? Please advise

## 2014-06-30 NOTE — Telephone Encounter (Signed)
Patient is aware 

## 2014-06-30 NOTE — Telephone Encounter (Signed)
Pt needs to talk to someone about injections please call [phone]

## 2014-07-13 ENCOUNTER — Ambulatory Visit (INDEPENDENT_AMBULATORY_CARE_PROVIDER_SITE_OTHER): Payer: Managed Care, Other (non HMO) | Admitting: Neurology

## 2014-07-13 ENCOUNTER — Encounter: Payer: Self-pay | Admitting: Neurology

## 2014-07-13 VITALS — BP 100/68 | HR 70 | Temp 97.7°F | Resp 18 | Ht 64.0 in | Wt 129.7 lb

## 2014-07-13 DIAGNOSIS — G43719 Chronic migraine without aura, intractable, without status migrainosus: Secondary | ICD-10-CM | POA: Diagnosis not present

## 2014-07-13 MED ORDER — TOPIRAMATE 25 MG PO TABS: ORAL_TABLET | ORAL | Status: DC

## 2014-07-13 NOTE — Patient Instructions (Signed)
1.  Stop nortriptyline.   2.  Start topiramate 25mg  tablets.  Take 1 tablet at bedtime for 7 days and then increase to 2tablets (50mg ) at bedtime.  Possible side effects include: impaired thinking, sedation, paresthesias (numbness and tingling) and weight loss.  It may cause dehydration and there is a small risk for kidney stones, so make sure to stay hydrated with water during the day.  There is also a very small risk for glaucoma, so if you notice any change in your vision while taking this medication, see an ophthalmologist.   3.  At earliest onset of severe headache, try Zomig 5mg  nasal spray.  Spray once in one nostril.  May repeat one spray once in 2 hours if needed.  Do not exceed 2 sprays in 24 hours. 4.  Stop Sumatriptan 5.  Follow up in 3 months but call in 4 weeks with update and we can adjust medication if needed.

## 2014-07-13 NOTE — Progress Notes (Signed)
NEUROLOGY FOLLOW UP OFFICE NOTE  [name] [id number]  HISTORY OF PRESENT ILLNESS: [name] is a 49 year old right-handed woman with COPD, hyperlipidemia, asthma who follows up for chronic intractable migraines.    UPDATE: Intensity:  7/10 (10/10 when severe) Duration:  Constant but intensity fluctuates (Imitrex [place] helps but it returns) Frequency:  constant Current abortive therapy:  Imitrex 6mg  [place] (taking 5 days out of the month) Current preventative therapy:  nortriptyline 25mg  (dry mouth) Other medications:  citalopram 20mg , levothyroxine  Caffeine:  Coffee and coke daily Alcohol:  no Smoker:  no Diet:  Stopped bananas and chocolate.  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 Quality:  pounding Intensity:  4/10 (10/10 when severe) Aura:  no Prodrome:  no Associated symptoms:  Nausea, vomiting, diarrhea, photophobia, phonophobia, osmophobia, blurred vision.  More recently heaviness and tingling in extremities, as well as dizziness. Duration:  Daily headache but when severe, usually lasts 2 weeks Frequency:  daily Triggers/exacerbating factors:  unknown Relieving factors:  none Activity:  Almost unbearable 15 days out of the month but forces self to go to work. She also was having brief episodes of dizziness.  Past abortive therapy:  Maxalt, ibuprofen, naproxen (GI upset), Tylenol, Excedrin, Fiorinal, Percocet, sumatriptan 100mg  Past preventative therapy:  none  She had an MRI of the brain without contrast performed on 05/22/2011, which showed non-specific scattered foci of primarily subcortical white matter signal in the frontal and parietal regions.  CT of the head performed on 04/04/14, was unremarkable.  Family history of headache:  Mom and  maternal grandmother.  No first degree relatives with cerebral aneurysm  PAST MEDICAL HISTORY: Past Medical History  Diagnosis Date  . Rectal discharge 07/28/2010  . Sluggishness 07/28/2010  . Cluster headaches   . COPD (chronic obstructive pulmonary disease)   . Asthma   . Hyperlipemia   . COPD (chronic obstructive pulmonary disease) with chronic bronchitis   . Pneumothorax   . Shortness of breath   . Hypothyroidism   . Anxiety   . Migraine     MEDICATIONS: Current Outpatient Prescriptions on File Prior to Visit  Medication Sig Dispense Refill  . albuterol (VENTOLIN HFA) 108 (90 BASE) MCG/ACT inhaler Inhale 2 puffs into the lungs every 4 (four) hours as needed. For shortness of breath    . Calcium Carbonate-Vitamin D (CALCIUM 600 + D PO) Take 1 tablet by mouth 2 (two) times daily.    . cetirizine (ZYRTEC) 10 MG tablet Take 10 mg by mouth daily.    . citalopram (CELEXA) 20 MG tablet Take 20 mg by mouth at bedtime.    Marland Kitchen. HYDROcodone-acetaminophen (NORCO/VICODIN) 5-325 MG per tablet Take 1 tablet by mouth every 4 (four) hours as needed. 10 tablet 0  . levothyroxine (SYNTHROID, LEVOTHROID) 50 MCG tablet Take 50 mcg by mouth daily before breakfast.    . LORazepam (ATIVAN) 0.5 MG tablet Take 1 tablet (0.5 mg total) by mouth at bedtime as needed for anxiety. 30 tablet 1  . butalbital-aspirin-caffeine (FIORINAL) 50-325-40 MG per capsule Take 1-2 capsules by mouth every 4 (four) hours as needed for headache ([place] of 6 capsules in a 24 hour period). (Patient not taking: Reported on 04/19/2014) 20 capsule 0  . HYDROcodone-acetaminophen (NORCO) 5-325 MG per tablet Take 1 tablet  by mouth every 4 (four) hours as needed for pain. (Patient not taking: Reported on 04/04/2014) 10 tablet 0  . oxyCODONE-acetaminophen (PERCOCET) 5-325 MG per tablet Take 2 tablets by mouth every 4 (four) hours as needed. (Patient not taking: Reported on 04/19/2014) 20 tablet 0  . predniSONE (DELTASONE) 10 MG tablet Take  6tabs x1day, then 5tabs x1day, then 4tabs x1day, then 3tabs x1day, then 2tabs x1day, then 1tab x1day, then STOP (Patient not taking: Reported on 04/19/2014) 21 tablet 0  . promethazine (PHENERGAN) 25 MG tablet Take 1 tablet (25 mg total) by mouth every 6 (six) hours as needed. (Patient not taking: Reported on 07/13/2014) 20 tablet 0  . vitamin C (ASCORBIC ACID) 500 MG tablet Take 500 mg by mouth daily.     No current facility-administered medications on file prior to visit.    ALLERGIES: Allergies  Allergen Reactions  . Penicillins Other (See Comments)    Childhood Reaction     FAMILY HISTORY: Family History  Problem Relation Age of Onset  . Colon cancer Neg Hx   . COPD Mother   . Diabetes Mother   . Hyperlipidemia Mother   . Hypertension Mother   . Bipolar disorder Son   . Heart failure Maternal Grandmother   . Hypertension Maternal Grandmother   . Thyroid disease Maternal Grandmother   . Diabetes Paternal Grandmother   . Alzheimer's disease Paternal Grandmother   . Heart failure Paternal Grandfather   . Hypertension Paternal Grandfather     SOCIAL HISTORY: History   Social History  . Marital Status: Married    Spouse Name: N/A  . Number of Children: N/A  . Years of Education: N/A   Occupational History  . Not on file.   Social History Main Topics  . Smoking status: Former Smoker -- 1.00 packs/day for 30 years    Types: Cigarettes    Quit date: 02/21/2007  . Smokeless tobacco: Never Used  . Alcohol Use: No     Comment: no  . Drug Use: No  . Sexual Activity:    Partners: Male    Birth Control/ Protection: Surgical   Other Topics Concern  . Not on file   Social History Narrative    REVIEW OF SYSTEMS: Constitutional: No fevers, chills, or sweats, no generalized fatigue, change in appetite Eyes: No visual changes, double vision, eye pain Ear, nose and throat: No hearing loss, ear pain, nasal congestion, sore throat Cardiovascular: No chest pain,  palpitations Respiratory:  No shortness of breath at rest or with exertion, wheezes GastrointestinaI: No nausea, vomiting, diarrhea, abdominal pain, fecal incontinence Genitourinary:  No dysuria, urinary retention or frequency Musculoskeletal:  No neck pain, back pain Integumentary: No rash, pruritus, skin lesions Neurological: as above Psychiatric: No depression, insomnia, anxiety Endocrine: No palpitations, fatigue, diaphoresis, mood swings, change in appetite, change in weight, increased thirst Hematologic/Lymphatic:  No anemia, purpura, petechiae. Allergic/Immunologic: no itchy/runny eyes, nasal congestion, recent allergic reactions, rashes  PHYSICAL EXAM: Filed Vitals:   07/13/14 0857  BP: 100/68  Pulse: 70  Temp: 97.7 F (36.5 C)  Resp: 18   General: No acute distress Head:  Normocephalic/atraumatic Eyes:  Fundoscopic exam unremarkable without vessel changes, exudates, hemorrhages or papilledema. Neck: supple, no paraspinal tenderness, full range of motion Heart:  Regular rate and rhythm Lungs:  Clear to auscultation bilaterally Back: No paraspinal tenderness Neurological Exam: alert and oriented to person, place, and time. Attention span and concentration intact, recent and remote memory intact, fund of knowledge intact.  Speech  fluent and not dysarthric, language intact.  CN II-XII intact. Fundoscopic exam unremarkable without vessel changes, exudates, hemorrhages or papilledema.  Bulk and tone normal, muscle strength 5/5 throughout.  Sensation to light touch, temperature and vibration intact.  Deep tendon reflexes 2+ in upper extremities and 3+ in lower extremities.  Finger to nose and heel to shin testing intact.  Gait normal.  IMPRESSION: Chronic migraine without aura, intractable  PLAN: 1.  Will discontinue nortriptyline and start topiramate, titrating to  at bedtime 2.  Will discontinue Imitrex [place] and provide her samples of Zomig  nasal spray  She requested an  MRI of the brain.  Since she has had these same headaches for 10 years and had an MRI of the brain 3 years ago, and her exam is unremarkable, I don't think an MRI of the brain is indicated at this point.  15 minutes spent with patient, over 50% spent discussing management and possible need for MRI  Shon Millet, DO  CC:  Theodis Shove, MD

## 2014-10-19 ENCOUNTER — Ambulatory Visit: Payer: Managed Care, Other (non HMO) | Admitting: Neurology

## 2014-12-30 ENCOUNTER — Ambulatory Visit (HOSPITAL_COMMUNITY)
Admission: RE | Admit: 2014-12-30 | Discharge: 2014-12-30 | Disposition: A | Discharge status: Home / Self Care | Payer: Managed Care, Other (non HMO) | Source: Ambulatory Visit | Attending: Family Medicine | Admitting: Family Medicine

## 2014-12-30 ENCOUNTER — Other Ambulatory Visit (HOSPITAL_COMMUNITY): Payer: Self-pay | Admitting: Family Medicine

## 2014-12-30 DIAGNOSIS — M2550 Pain in unspecified joint: Secondary | ICD-10-CM

## 2014-12-30 DIAGNOSIS — M25561 Pain in right knee: Secondary | ICD-10-CM | POA: Diagnosis present

## 2015-01-10 ENCOUNTER — Encounter (HOSPITAL_COMMUNITY): Payer: Self-pay | Admitting: *Deleted

## 2015-01-10 ENCOUNTER — Emergency Department (HOSPITAL_COMMUNITY): Payer: Managed Care, Other (non HMO)

## 2015-01-10 ENCOUNTER — Emergency Department (HOSPITAL_COMMUNITY)
Admission: EM | Admit: 2015-01-10 | Discharge: 2015-01-10 | Disposition: A | Discharge status: Home / Self Care | Payer: Managed Care, Other (non HMO) | Attending: Emergency Medicine | Admitting: Emergency Medicine

## 2015-01-10 DIAGNOSIS — M25512 Pain in left shoulder: Secondary | ICD-10-CM | POA: Insufficient documentation

## 2015-01-10 DIAGNOSIS — Z79899 Other long term (current) drug therapy: Secondary | ICD-10-CM | POA: Diagnosis not present

## 2015-01-10 DIAGNOSIS — M25551 Pain in right hip: Secondary | ICD-10-CM | POA: Insufficient documentation

## 2015-01-10 DIAGNOSIS — M25511 Pain in right shoulder: Secondary | ICD-10-CM | POA: Insufficient documentation

## 2015-01-10 DIAGNOSIS — R079 Chest pain, unspecified: Secondary | ICD-10-CM | POA: Diagnosis not present

## 2015-01-10 DIAGNOSIS — J441 Chronic obstructive pulmonary disease with (acute) exacerbation: Secondary | ICD-10-CM | POA: Diagnosis not present

## 2015-01-10 DIAGNOSIS — M25552 Pain in left hip: Secondary | ICD-10-CM | POA: Insufficient documentation

## 2015-01-10 DIAGNOSIS — G43909 Migraine, unspecified, not intractable, without status migrainosus: Secondary | ICD-10-CM | POA: Diagnosis not present

## 2015-01-10 DIAGNOSIS — F419 Anxiety disorder, unspecified: Secondary | ICD-10-CM | POA: Insufficient documentation

## 2015-01-10 DIAGNOSIS — R110 Nausea: Secondary | ICD-10-CM | POA: Diagnosis not present

## 2015-01-10 DIAGNOSIS — Z880 Allergy status to penicillin: Secondary | ICD-10-CM | POA: Insufficient documentation

## 2015-01-10 DIAGNOSIS — Z87891 Personal history of nicotine dependence: Secondary | ICD-10-CM | POA: Diagnosis not present

## 2015-01-10 DIAGNOSIS — Z7951 Long term (current) use of inhaled steroids: Secondary | ICD-10-CM | POA: Diagnosis not present

## 2015-01-10 DIAGNOSIS — E039 Hypothyroidism, unspecified: Secondary | ICD-10-CM | POA: Diagnosis not present

## 2015-01-10 LAB — BASIC METABOLIC PANEL
Anion gap: 8 (ref 5–15)
BUN: 9 mg/dL (ref 6–20)
CO2: 26 mmol/L (ref 22–32)
Calcium: 9.3 mg/dL (ref 8.9–10.3)
Chloride: 107 mmol/L (ref 101–111)
Creatinine, Ser: 0.78 mg/dL (ref 0.44–1.00)
GFR calc Af Amer: >60 mL/min (ref >60)
GFR calc non Af Amer: >60 mL/min (ref >60)
Glucose, Bld: 100 mg/dL — ABNORMAL HIGH (ref 65–99)
Potassium: 3.4 mmol/L — ABNORMAL LOW (ref 3.5–5.1)
Sodium: 141 mmol/L (ref 135–145)

## 2015-01-10 LAB — CBC
HCT: 34.1 % — ABNORMAL LOW (ref 36.0–46.0)
Hemoglobin: 11.4 g/dL — ABNORMAL LOW (ref 12.0–15.0)
MCH: 30.2 pg (ref 26.0–34.0)
MCHC: 33.4 g/dL (ref 30.0–36.0)
MCV: 90.5 fL (ref 78.0–100.0)
Platelets: 274 10*3/uL (ref 150–400)
RBC: 3.77 MIL/uL — ABNORMAL LOW (ref 3.87–5.11)
RDW: 12.6 % (ref 11.5–15.5)
WBC: 8.1 10*3/uL (ref 4.0–10.5)

## 2015-01-10 LAB — TROPONIN I: Troponin I: <0.03 ng/mL (ref <0.031)

## 2015-01-10 MED ORDER — SODIUM CHLORIDE 0.9 % IV SOLN
20.0000 mL | INTRAVENOUS | Status: DC
  Administered 2015-01-10: 20 mL via INTRAVENOUS

## 2015-01-10 NOTE — Discharge Instructions (Signed)

## 2015-01-10 NOTE — ED Provider Notes (Signed)
CSN: [id number]     Arrival date & time 01/10/15  1318 History   First MD Initiated Contact with Patient 01/10/15 1328     Chief Complaint  Patient presents with  . Chest Pain     (Consider location/radiation/quality/duration/timing/severity/associated sxs/prior Treatment) Patient is a 49 y.o. female presenting with chest pain. The history is provided by the patient.  Chest Pain Pain location:  L chest Pain quality: dull and sharp   Pain quality comment:  Squeezing Pain radiates to:  Does not radiate Pain radiates to the back: no   Pain severity:  Mild Onset quality:  Gradual Duration:  2 days Timing:  Intermittent Progression:  Waxing and waning Chronicity:  New Context: at rest   Context: not breathing   Relieved by:  Nothing Worsened by:  Nothing tried Ineffective treatments: TUMS. Associated symptoms: nausea and shortness of breath (with COPD )   Associated symptoms: no abdominal pain, no cough and no fever   Risk factors: no high cholesterol, no hypertension and no smoking     Past Medical History  Diagnosis Date  . Rectal discharge 07/28/2010  . Sluggishness 07/28/2010  . Cluster headaches   . COPD (chronic obstructive pulmonary disease) (HCC)   . Asthma   . Hyperlipemia   . COPD (chronic obstructive pulmonary disease) with chronic bronchitis (HCC)   . Pneumothorax   . Shortness of breath   . Hypothyroidism   . Anxiety   . Migraine    Past Surgical History  Procedure Laterality Date  . Abdominal hysterectomy    . Lobectomy      right lung   . Cholecystectomy    . Tubal ligation    . Colonoscopy N/A 07/30/2013    Procedure: COLONOSCOPY;  Surgeon: Malissa Hippo, MD;  Location: [facility];  Service: Endoscopy;  Laterality: N/A;  930   Family History  Problem Relation Age of Onset  . Colon cancer Neg Hx   . COPD Mother   . Diabetes Mother   . Hyperlipidemia Mother   . Hypertension Mother   . Bipolar disorder Son   . Heart failure Maternal  Grandmother   . Hypertension Maternal Grandmother   . Thyroid disease Maternal Grandmother   . Diabetes Paternal Grandmother   . Alzheimer's disease Paternal Grandmother   . Heart failure Paternal Grandfather   . Hypertension Paternal Grandfather    Social History  Substance Use Topics  . Smoking status: Former Smoker -- 1.00 packs/day for 30 years    Types: Cigarettes    Quit date: 02/21/2007  . Smokeless tobacco: Never Used  . Alcohol Use: No     Comment: no   OB History    No data available     Review of Systems  Constitutional: Negative for fever.  Respiratory: Positive for shortness of breath (with COPD ). Negative for cough.   Cardiovascular: Positive for chest pain.  Gastrointestinal: Positive for nausea. Negative for abdominal pain.  All other systems reviewed and are negative.     Allergies  Penicillins  Home Medications   Prior to Admission medications   Medication Sig Start Date End Date Taking? Authorizing Provider  acetaminophen (TYLENOL) 500 MG tablet Take 1,000 mg by mouth every 6 (six) hours as needed for moderate pain.   Yes Historical Provider, MD  albuterol (VENTOLIN HFA) 108 (90 BASE) MCG/ACT inhaler Inhale 2 puffs into the lungs every 4 (four) hours as needed. For shortness of breath   Yes Historical Provider, MD  aspirin-acetaminophen-caffeine (EXCEDRIN MIGRAINE) 250-250-65 MG tablet Take 2 tablets by mouth every 6 (six) hours as needed for headache or migraine.   Yes Historical Provider, MD  Calcium Carbonate-Vitamin D (CALCIUM 600 + D PO) Take 1 tablet by mouth 2 (two) times daily.   Yes Historical Provider, MD  cetirizine (ZYRTEC) 10 MG tablet Take 10 mg by mouth daily.   Yes Historical Provider, MD  levothyroxine (SYNTHROID, LEVOTHROID) 100 MCG tablet Take 100 mcg by mouth daily before breakfast.   Yes Historical Provider, MD  LORazepam (ATIVAN) 0.5 MG tablet Take 1 tablet (0.5 mg total) by mouth at bedtime as needed for anxiety. 03/21/12  Yes  Cira Servant, MD  mometasone-formoterol (DULERA) 200-5 MCG/ACT AERO Inhale 1 puff into the lungs 2 (two) times daily.   Yes Historical Provider, MD  SUMAtriptan 6 MG/0.5ML SOAJ Inject 6 mg as directed daily as needed (migraine).  12/01/14  Yes Historical Provider, MD  HYDROcodone-acetaminophen (NORCO/VICODIN) 5-325 MG per tablet Take 1 tablet by mouth every 4 (four) hours as needed. Patient not taking: Reported on 01/10/2015 12/17/13   Burgess Amor, PA-C  promethazine (PHENERGAN) 25 MG tablet Take 1 tablet (25 mg total) by mouth every 6 (six) hours as needed. Patient not taking: Reported on 07/13/2014 04/07/14   Donnetta Hutching, MD  topiramate (TOPAMAX) 25 MG tablet Take 1tab at bedtime for 7days, then 2tabs at bedtime Patient not taking: Reported on 01/10/2015 07/13/14   Drema Dallas, DO   BP 97/74 mmHg  Pulse 66  Temp(Src) 98.1 F (36.7 C) (Oral)  Resp 11  Ht  (1.651 m)  Wt 133 lb (60.328 kg)  BMI 22.13 kg/m2  SpO2 99% Physical Exam  Constitutional: She is oriented to person, place, and time. She appears well-developed and well-nourished. No distress.  HENT:  Head: Normocephalic.  Eyes: Conjunctivae are normal.  Neck: Neck supple. No tracheal deviation present.  Cardiovascular: Normal rate, regular rhythm and normal heart sounds.   Pulmonary/Chest: Effort normal and breath sounds normal. No respiratory distress. She has no wheezes. She has no rales. She exhibits no tenderness.  Abdominal: Soft. She exhibits no distension. There is no tenderness.  Neurological: She is alert and oriented to person, place, and time.  Skin: Skin is warm and dry.  Psychiatric: She has a normal mood and affect.    ED Course  Procedures (including critical care time) Labs Review Labs Reviewed  CBC - Abnormal; Notable for the following:    RBC 3.77 (*)    Hemoglobin 11.4 (*)    HCT 34.1 (*)    All other components within normal limits  BASIC METABOLIC PANEL - Abnormal; Notable for the following:     Potassium 3.4 (*)    Glucose, Bld 100 (*)    All other components within normal limits  TROPONIN I    Imaging Review Dg Chest Portable 1 View  01/10/2015   CLINICAL DATA:  Chest pain for 2 days.  EXAM: PORTABLE CHEST 1 VIEW  COMPARISON:  11/25/2013  FINDINGS: Cardiomediastinal silhouette is normal. Mediastinal contours appear intact.  There is no evidence of focal airspace consolidation, pleural effusion or pneumothorax. There are stable postsurgical changes in the right apex, and biapical pleural thickening.  Osseous structures are without acute abnormality. Soft tissues are grossly normal.  IMPRESSION: No radiographic evidence of acute cardiopulmonary abnormality.  Stable postsurgical changes in the right apex and biapical pleural thickening.   Electronically Signed   By: Ted Mcalpine M.D.   On:  01/10/2015 13:47   I have personally reviewed and evaluated these images and lab results as part of my medical decision-making.   EKG Interpretation   Date/Time:  Monday January 10 2015 13:25:27 EDT Ventricular Rate:  75 PR Interval:  173 QRS Duration: 94 QT Interval:  396 QTC Calculation: 442 R Axis:   82 Text Interpretation:  Sinus rhythm RSR' in V1 or V2, right VCD or RVH  Baseline wander in lead(s) V1 Confirmed by Siya Flurry MD, Reuel Boom ([id number]) on  01/10/2015 1:29:06 PM      MDM   Final diagnoses:  Left sided chest pain    49 year old female with no previous cardiac history presents with left-sided chest pain after having aching pain in her bilateral hips and shoulders. She is concerned she may be having reflux symptoms. Pain is not exertional. No radiation, diaphoresis, emesis or other concerning symptoms. Otherwise well-appearing. Heart score is 2, troponin is negative, no significant metabolic or hematologic etiology for the patient's symptoms, recommended close primary care follow-up and return precautions discussed for new or worsening concerning symptoms.    Lyndal Pulley,  MD 01/10/15 [phone]

## 2015-01-10 NOTE — ED Notes (Signed)
Chest pain for the past 2 days 

## 2015-06-16 ENCOUNTER — Other Ambulatory Visit (HOSPITAL_COMMUNITY): Payer: Self-pay | Admitting: Internal Medicine

## 2015-06-16 DIAGNOSIS — Z1231 Encounter for screening mammogram for malignant neoplasm of breast: Secondary | ICD-10-CM

## 2015-06-22 ENCOUNTER — Ambulatory Visit (HOSPITAL_COMMUNITY)
Admission: RE | Admit: 2015-06-22 | Discharge: 2015-06-22 | Disposition: A | Discharge status: Home / Self Care | Payer: Managed Care, Other (non HMO) | Source: Ambulatory Visit | Attending: Registered Nurse | Admitting: Registered Nurse

## 2015-06-22 ENCOUNTER — Other Ambulatory Visit (HOSPITAL_COMMUNITY): Payer: Self-pay | Admitting: Registered Nurse

## 2015-06-22 DIAGNOSIS — Z1231 Encounter for screening mammogram for malignant neoplasm of breast: Secondary | ICD-10-CM

## 2015-09-20 ENCOUNTER — Ambulatory Visit: Payer: Self-pay | Admitting: Obstetrics and Gynecology

## 2015-10-21 ENCOUNTER — Other Ambulatory Visit (HOSPITAL_COMMUNITY): Payer: Self-pay | Admitting: Registered Nurse

## 2015-10-21 DIAGNOSIS — M810 Age-related osteoporosis without current pathological fracture: Secondary | ICD-10-CM

## 2015-10-21 DIAGNOSIS — M898X0 Other specified disorders of bone, multiple sites: Secondary | ICD-10-CM

## 2015-10-24 ENCOUNTER — Other Ambulatory Visit (HOSPITAL_COMMUNITY): Payer: Managed Care, Other (non HMO)

## 2015-10-25 ENCOUNTER — Ambulatory Visit (HOSPITAL_COMMUNITY)
Admission: RE | Admit: 2015-10-25 | Discharge: 2015-10-25 | Disposition: A | Discharge status: Home / Self Care | Payer: Managed Care, Other (non HMO) | Source: Ambulatory Visit | Attending: Registered Nurse | Admitting: Registered Nurse

## 2015-10-25 DIAGNOSIS — M898X0 Other specified disorders of bone, multiple sites: Secondary | ICD-10-CM

## 2015-10-25 DIAGNOSIS — M8589 Other specified disorders of bone density and structure, multiple sites: Secondary | ICD-10-CM | POA: Insufficient documentation

## 2015-10-25 DIAGNOSIS — M810 Age-related osteoporosis without current pathological fracture: Secondary | ICD-10-CM

## 2015-10-25 DIAGNOSIS — M898X9 Other specified disorders of bone, unspecified site: Secondary | ICD-10-CM | POA: Diagnosis present

## 2016-03-15 ENCOUNTER — Ambulatory Visit (HOSPITAL_COMMUNITY)
Admission: RE | Admit: 2016-03-15 | Discharge: 2016-03-15 | Disposition: A | Discharge status: Home / Self Care | Payer: Managed Care, Other (non HMO) | Source: Ambulatory Visit | Attending: Family Medicine | Admitting: Family Medicine

## 2016-03-15 ENCOUNTER — Other Ambulatory Visit (HOSPITAL_COMMUNITY): Payer: Self-pay | Admitting: Family Medicine

## 2016-03-15 DIAGNOSIS — G8929 Other chronic pain: Secondary | ICD-10-CM

## 2016-03-15 DIAGNOSIS — M25511 Pain in right shoulder: Secondary | ICD-10-CM | POA: Insufficient documentation

## 2016-05-14 ENCOUNTER — Other Ambulatory Visit (HOSPITAL_COMMUNITY): Payer: Self-pay | Admitting: Internal Medicine

## 2016-05-14 DIAGNOSIS — Z1231 Encounter for screening mammogram for malignant neoplasm of breast: Secondary | ICD-10-CM

## 2016-05-16 ENCOUNTER — Ambulatory Visit (INDEPENDENT_AMBULATORY_CARE_PROVIDER_SITE_OTHER): Payer: Managed Care, Other (non HMO) | Admitting: Orthopaedic Surgery

## 2016-05-16 ENCOUNTER — Encounter: Payer: Self-pay | Admitting: Orthopaedic Surgery

## 2016-05-16 VITALS — BP 99/61 | HR 77 | Temp 96.8°F | Ht 64.0 in | Wt 134.0 lb

## 2016-05-16 DIAGNOSIS — M25511 Pain in right shoulder: Secondary | ICD-10-CM

## 2016-05-16 DIAGNOSIS — M25411 Effusion, right shoulder: Secondary | ICD-10-CM

## 2016-05-16 NOTE — Progress Notes (Signed)
Subjective:    Patient ID: [name], female    DOB: March 24, 1966, 51 y.o.   MRN: [id number]  HPI She has had pain of the right shoulder for over three months or so.  It is getting worse. She was seen at [facility] in December for this.  X-rays of the right shoulder on 03-15-16 were negative.  She has been on Mobic which helps some.  She has no numbness.  She has tried ice and heat with slight help also. She has no trauma, no redness.  She is tired of hurting.  She has pain rolling over on it at night.   Review of Systems  HENT: Negative for congestion.   Respiratory: Positive for shortness of breath. Negative for cough.   Cardiovascular: Negative for chest pain and leg swelling.  Endocrine: Positive for cold intolerance.  Musculoskeletal: Positive for arthralgias and joint swelling.  Allergic/Immunologic: Positive for environmental allergies.  Neurological: Positive for headaches.  Psychiatric/Behavioral: The patient is nervous/anxious.    Past Medical History:  Diagnosis Date  . Anxiety   . Asthma   . Cluster headaches   . COPD (chronic obstructive pulmonary disease) (HCC)   . COPD (chronic obstructive pulmonary disease) with chronic bronchitis (HCC)   . Hyperlipemia   . Hypothyroidism   . Migraine   . Pneumothorax   . Rectal discharge 07/28/2010  . Shortness of breath   . Sluggishness 07/28/2010    Past Surgical History:  Procedure Laterality Date  . ABDOMINAL HYSTERECTOMY    . CHOLECYSTECTOMY    . COLONOSCOPY N/A 07/30/2013   Procedure: COLONOSCOPY;  Surgeon: Malissa Hippo, MD;  Location: [facility];  Service: Endoscopy;  Laterality: N/A;  930  . LOBECTOMY     right lung   . TUBAL LIGATION      Current Outpatient Prescriptions on File Prior to Visit  Medication Sig Dispense Refill  . acetaminophen (TYLENOL) 500 MG tablet Take 1,000 mg by mouth every 6 (six) hours as needed for moderate pain.    Marland Kitchen albuterol (VENTOLIN HFA) 108 (90 BASE) MCG/ACT  inhaler Inhale 2 puffs into the lungs every 4 (four) hours as needed. For shortness of breath    . aspirin-acetaminophen-caffeine (EXCEDRIN MIGRAINE) 250-250-65 MG tablet Take 2 tablets by mouth every 6 (six) hours as needed for headache or migraine.    . Calcium Carbonate-Vitamin D (CALCIUM 600 + D PO) Take 1 tablet by mouth 2 (two) times daily.    . cetirizine (ZYRTEC) 10 MG tablet Take 10 mg by mouth daily.    Marland Kitchen HYDROcodone-acetaminophen (NORCO/VICODIN) 5-325 MG per tablet Take 1 tablet by mouth every 4 (four) hours as needed. (Patient not taking: Reported on 01/10/2015) 10 tablet 0  . levothyroxine (SYNTHROID, LEVOTHROID) 100 MCG tablet Take 100 mcg by mouth daily before breakfast.    . LORazepam (ATIVAN) 0.5 MG tablet Take 1 tablet (0.5 mg total) by mouth at bedtime as needed for anxiety. 30 tablet 1  . mometasone-formoterol (DULERA) 200-5 MCG/ACT AERO Inhale 1 puff into the lungs 2 (two) times daily.    . promethazine (PHENERGAN) 25 MG tablet Take 1 tablet (25 mg total) by mouth every 6 (six) hours as needed. (Patient not taking: Reported on 07/13/2014) 20 tablet 0  . SUMAtriptan 6 MG/0.5ML SOAJ Inject 6 mg as directed daily as needed (migraine).     . topiramate (TOPAMAX) 25 MG tablet Take 1tab at bedtime for 7days, then 2tabs at bedtime (Patient not taking: Reported on 01/10/2015)  60 tablet 0   No current facility-administered medications on file prior to visit.     Social History   Social History  . Marital status: Married    Spouse name: N/A  . Number of children: N/A  . Years of education: N/A   Occupational History  . Not on file.   Social History Main Topics  . Smoking status: Former Smoker    Packs/day: 1.00    Years: 30.00    Types: Cigarettes    Quit date: 02/21/2007  . Smokeless tobacco: Never Used  . Alcohol use No     Comment: no  . Drug use: No  . Sexual activity: Not Currently    Partners: Male    Birth control/ protection: Surgical   Other Topics Concern  .  Not on file   Social History Narrative  . No narrative on file    Family History  Problem Relation Age of Onset  . COPD Mother   . Diabetes Mother   . Hyperlipidemia Mother   . Hypertension Mother   . Bipolar disorder Son   . Heart failure Maternal Grandmother   . Hypertension Maternal Grandmother   . Thyroid disease Maternal Grandmother   . Diabetes Paternal Grandmother   . Alzheimer's disease Paternal Grandmother   . Heart failure Paternal Grandfather   . Hypertension Paternal Grandfather   . Hypertension Father   . Colon cancer Neg Hx     BP 99/61   Pulse 77   Temp (!) 96.8 F (36 C)   Ht 5\' 4"  (1.626 m)   Wt 134 lb (60.8 kg)   BMI 23.00 kg/m      Objective:   Physical Exam  Constitutional: She is oriented to person, place, and time. She appears well-developed and well-nourished.  HENT:  Head: Normocephalic and atraumatic.  Eyes: Conjunctivae and EOM are normal. Pupils are equal, round, and reactive to light.  Neck: Normal range of motion. Neck supple.  Cardiovascular: Normal rate, regular rhythm and intact distal pulses.   Pulmonary/Chest: Effort normal.  Abdominal: Soft.  Musculoskeletal: She exhibits tenderness (Right shoulder pain, ROM forward 165, abduction 140, internal 35, external 35, extension 20, adduction full.  NV itntact.  No redness or spasm.  Neck negative.  Left shoulder negative).  Neurological: She is alert and oriented to person, place, and time. She displays normal reflexes. No cranial nerve deficit. She exhibits normal muscle tone. Coordination normal.  Skin: Skin is warm and dry.  Psychiatric: She has a normal mood and affect. Her behavior is normal. Judgment and thought content normal.          Assessment & Plan:   Encounter Diagnosis  Name Primary?  . Pain and swelling of right shoulder Yes   PROCEDURE NOTE:  The patient request injection, verbal consent was obtained.  The right shoulder was prepped appropriately after time  out was performed.   Sterile technique was observed and injection of 1 cc of Depo-Medrol 40 mg with several cc's of plain xylocaine. Anesthesia was provided by ethyl chloride and a 20-gauge needle was used to inject the shoulder area. A posterior approach was used.  The injection was tolerated well.  A band aid dressing was applied.  The patient was advised to apply ice later today and tomorrow to the injection sight as needed.  I would like to have her continue the Mobic.  Sheet of exercises for shoulder given.  Consider MRI if not improved.  Return in two weeks.  Call if any problem.  Electronically Signed Darreld Mclean, MD 2/7/20184:27 PM

## 2016-05-16 NOTE — Patient Instructions (Signed)

## 2016-05-30 ENCOUNTER — Ambulatory Visit: Payer: Managed Care, Other (non HMO) | Admitting: Orthopaedic Surgery

## 2016-05-31 ENCOUNTER — Encounter: Payer: Self-pay | Admitting: Orthopaedic Surgery

## 2016-05-31 ENCOUNTER — Ambulatory Visit (INDEPENDENT_AMBULATORY_CARE_PROVIDER_SITE_OTHER): Payer: 59 | Admitting: Orthopaedic Surgery

## 2016-05-31 VITALS — BP 111/68 | HR 59 | Temp 97.0°F | Ht 64.0 in | Wt 130.0 lb

## 2016-05-31 DIAGNOSIS — M25411 Effusion, right shoulder: Secondary | ICD-10-CM

## 2016-05-31 DIAGNOSIS — M25511 Pain in right shoulder: Secondary | ICD-10-CM

## 2016-05-31 NOTE — Progress Notes (Signed)
Patient AO:[id number]:[name], female DOB:04/05/1966, [age] y.o. ONG:[id number]  Chief Complaint  Patient presents with  . Follow-up    right shoulder pain    HPI  [name] is a 51 y.o. female who has pain of the right shoulder.  I gave her an injection last visit and it helped for about ten days.  The other day she started having more pain in the right shoulder.  She is unable to raise overhead again.  She was cooking and had pain in the shoulder when lifting a hot pan and dropped the pan yesterday.  She was not hurt.  She has no numbness.  She is tired of hurting.  I will get a MRI of the right shoulder.  I am concerned about rotator cuff tear.  She has taken her medicine and done her exercises. HPI  Body mass index is 22.31 kg/m.  ROS  Review of Systems  HENT: Negative for congestion.   Respiratory: Positive for shortness of breath. Negative for cough.   Cardiovascular: Negative for chest pain and leg swelling.  Endocrine: Positive for cold intolerance.  Musculoskeletal: Positive for arthralgias and joint swelling.  Allergic/Immunologic: Positive for environmental allergies.  Neurological: Positive for headaches.  Psychiatric/Behavioral: The patient is nervous/anxious.     Past Medical History:  Diagnosis Date  . Anxiety   . Asthma   . Cluster headaches   . COPD (chronic obstructive pulmonary disease) (HCC)   . COPD (chronic obstructive pulmonary disease) with chronic bronchitis (HCC)   . Hyperlipemia   . Hypothyroidism   . Migraine   . Pneumothorax   . Rectal discharge 07/28/2010  . Shortness of breath   . Sluggishness 07/28/2010    Past Surgical History:  Procedure Laterality Date  . ABDOMINAL HYSTERECTOMY    . CHOLECYSTECTOMY    . COLONOSCOPY N/A 07/30/2013   Procedure: COLONOSCOPY;  Surgeon: Malissa HippoNajeeb U Rehman, MD;  Location: [facility];  Service: Endoscopy;  Laterality: N/A;  930  . LOBECTOMY     right lung   . TUBAL LIGATION      Family History   Problem Relation Age of Onset  . COPD Mother   . Diabetes Mother   . Hyperlipidemia Mother   . Hypertension Mother   . Bipolar disorder Son   . Heart failure Maternal Grandmother   . Hypertension Maternal Grandmother   . Thyroid disease Maternal Grandmother   . Diabetes Paternal Grandmother   . Alzheimer's disease Paternal Grandmother   . Heart failure Paternal Grandfather   . Hypertension Paternal Grandfather   . Hypertension Father   . Colon cancer Neg Hx     Social History Social History  Substance Use Topics  . Smoking status: Former Smoker    Packs/day: 1.00    Years: 30.00    Types: Cigarettes    Quit date: 02/21/2007  . Smokeless tobacco: Never Used  . Alcohol use No     Comment: no      Current Outpatient Prescriptions  Medication Sig Dispense Refill  . acetaminophen (TYLENOL) 500 MG tablet Take 1,000 mg by mouth every 6 (six) hours as needed for moderate pain.    Marland Kitchen. albuterol (VENTOLIN HFA) 108 (90 BASE) MCG/ACT inhaler Inhale 2 puffs into the lungs every 4 (four) hours as needed. For shortness of breath    . aspirin-acetaminophen-caffeine (EXCEDRIN MIGRAINE) 250-250-65 MG tablet Take 2 tablets by mouth every 6 (six) hours as needed for headache or migraine.    . Calcium Carbonate-Vitamin  D (CALCIUM 600 + D PO) Take 1 tablet by mouth 2 (two) times daily.    . cetirizine (ZYRTEC) 10 MG tablet Take 10 mg by mouth daily.    Marland Kitchen HYDROcodone-acetaminophen (NORCO/VICODIN) 5-325 MG per tablet Take 1 tablet by mouth every 4 (four) hours as needed. (Patient not taking: Reported on 01/10/2015) 10 tablet 0  . levothyroxine (SYNTHROID, LEVOTHROID) 100 MCG tablet Take 100 mcg by mouth daily before breakfast.    . LORazepam (ATIVAN) 0.5 MG tablet Take 1 tablet (0.5 mg total) by mouth at bedtime as needed for anxiety. 30 tablet 1  . mometasone-formoterol (DULERA) 200-5 MCG/ACT AERO Inhale 1 puff into the lungs 2 (two) times daily.    . promethazine (PHENERGAN) 25 MG tablet Take 1  tablet (25 mg total) by mouth every 6 (six) hours as needed. (Patient not taking: Reported on 07/13/2014) 20 tablet 0  . SUMAtriptan 6 MG/0.5ML SOAJ Inject 6 mg as directed daily as needed (migraine).     . topiramate (TOPAMAX) 25 MG tablet Take 1tab at bedtime for 7days, then 2tabs at bedtime (Patient not taking: Reported on 01/10/2015) 60 tablet 0   No current facility-administered medications for this visit.      Physical Exam  Blood pressure 111/68, pulse (!) 59, temperature 97 F (36.1 C), height 5\' 4"  (1.626 m), weight 130 lb (59 kg).  Constitutional: overall normal hygiene, normal nutrition, well developed, normal grooming, normal body habitus. Assistive device:none  Musculoskeletal: gait and station Limp none, muscle tone and strength are normal, no tremors or atrophy is present.  .  Neurological: coordination overall normal.  Deep tendon reflex/nerve stretch intact.  Sensation normal.  Cranial nerves II-XII intact.   Skin:   Normal overall no scars, lesions, ulcers or rashes. No psoriasis.  Psychiatric: Alert and oriented x 3.  Recent memory intact, remote memory unclear.  Normal mood and affect. Well groomed.  Good eye contact.  Cardiovascular: overall no swelling, no varicosities, no edema bilaterally, normal temperatures of the legs and arms, no clubbing, cyanosis and good capillary refill.  Lymphatic: palpation is normal.  Examination of right Upper Extremity is done.  Inspection:   Overall:  Elbow non-tender without crepitus or defects, forearm non-tender without crepitus or defects, wrist non-tender without crepitus or defects, hand non-tender.    Shoulder: with glenohumeral joint tenderness, without effusion.   Upper arm: without swelling and tenderness   Range of motion:   Overall:  Full range of motion of the elbow, full range of motion of wrist and full range of motion in fingers.   Shoulder:  right  145 degrees forward flexion; 100 degrees abduction; 30 degrees  internal rotation, 30 degrees external rotation, 15 degrees extension, 35 degrees adduction.   Stability:   Overall:  Shoulder, elbow and wrist stable   Strength and Tone:   Overall full shoulder muscles strength, full upper arm strength and normal upper arm bulk and tone.   The patient has been educated about the nature of the problem(s) and counseled on treatment options.  The patient appeared to understand what I have discussed and is in agreement with it.  Encounter Diagnosis  Name Primary?  . Pain and swelling of right shoulder Yes    PLAN Call if any problems.  Precautions discussed.  Continue current medications.   Return to clinic after MRI of the right shoulder   Electronically Signed Darreld Mclean, MD 2/22/20189:31 AM

## 2016-05-31 NOTE — Patient Instructions (Signed)
Give note for limited duty at work.

## 2016-06-19 ENCOUNTER — Ambulatory Visit (INDEPENDENT_AMBULATORY_CARE_PROVIDER_SITE_OTHER): Payer: 59 | Admitting: Orthopaedic Surgery

## 2016-06-19 ENCOUNTER — Encounter: Payer: Self-pay | Admitting: Orthopaedic Surgery

## 2016-06-19 VITALS — BP 119/77 | HR 69 | Temp 97.5°F | Ht 64.0 in | Wt 133.0 lb

## 2016-06-19 DIAGNOSIS — M75111 Incomplete rotator cuff tear or rupture of right shoulder, not specified as traumatic: Secondary | ICD-10-CM

## 2016-06-19 DIAGNOSIS — M25411 Effusion, right shoulder: Secondary | ICD-10-CM | POA: Diagnosis not present

## 2016-06-19 DIAGNOSIS — M25511 Pain in right shoulder: Secondary | ICD-10-CM

## 2016-06-19 NOTE — Patient Instructions (Signed)
Out of work 

## 2016-06-19 NOTE — Progress Notes (Signed)
Patient [name], female DOB:17-Sep-1965, 51 y.o. JWJ:[id number]  Chief Complaint  Patient presents with  . Shoulder Pain    Right    HPI  [name] is a 51 y.o. female who has pain of the right shoulder.  MRI done in [facility] shows a partial incomplete 0.3 cm bursal surface tear of the supraspinatus tendon.  I have explained the findings to her.  I have recommended PT and to increase her Aleve to two bid pc.  She will stay out of work. HPI  Body mass index is 22.83 kg/m.  ROS  Review of Systems  HENT: Negative for congestion.   Respiratory: Positive for shortness of breath. Negative for cough.   Cardiovascular: Negative for chest pain and leg swelling.  Endocrine: Positive for cold intolerance.  Musculoskeletal: Positive for arthralgias and joint swelling.  Allergic/Immunologic: Positive for environmental allergies.  Neurological: Positive for headaches.  Psychiatric/Behavioral: The patient is nervous/anxious.     Past Medical History:  Diagnosis Date  . Anxiety   . Asthma   . Cluster headaches   . COPD (chronic obstructive pulmonary disease) (HCC)   . COPD (chronic obstructive pulmonary disease) with chronic bronchitis (HCC)   . Hyperlipemia   . Hypothyroidism   . Migraine   . Pneumothorax   . Rectal discharge 07/28/2010  . Shortness of breath   . Sluggishness 07/28/2010    Past Surgical History:  Procedure Laterality Date  . ABDOMINAL HYSTERECTOMY    . CHOLECYSTECTOMY    . COLONOSCOPY N/A 07/30/2013   Procedure: COLONOSCOPY;  Surgeon: Malissa Hippo, MD;  Location: [facility];  Service: Endoscopy;  Laterality: N/A;  930  . LOBECTOMY     right lung   . TUBAL LIGATION      Family History  Problem Relation Age of Onset  . COPD Mother   . Diabetes Mother   . Hyperlipidemia Mother   . Hypertension Mother   . Bipolar disorder Son   . Heart failure Maternal Grandmother   . Hypertension Maternal Grandmother   . Thyroid disease  Maternal Grandmother   . Diabetes Paternal Grandmother   . Alzheimer's disease Paternal Grandmother   . Heart failure Paternal Grandfather   . Hypertension Paternal Grandfather   . Hypertension Father   . Colon cancer Neg Hx     Social History Social History  Substance Use Topics  . Smoking status: Former Smoker    Packs/day: 1.00    Years: 30.00    Types: Cigarettes    Quit date: 02/21/2007  . Smokeless tobacco: Never Used  . Alcohol use No     Comment: no      Current Outpatient Prescriptions  Medication Sig Dispense Refill  . acetaminophen (TYLENOL) 500 MG tablet Take 1,000 mg by mouth every 6 (six) hours as needed for moderate pain.    Marland Kitchen albuterol (VENTOLIN HFA) 108 (90 BASE) MCG/ACT inhaler Inhale 2 puffs into the lungs every 4 (four) hours as needed. For shortness of breath    . aspirin-acetaminophen-caffeine (EXCEDRIN MIGRAINE) 250-250-65 MG tablet Take 2 tablets by mouth every 6 (six) hours as needed for headache or migraine.    . Calcium Carbonate-Vitamin D (CALCIUM 600 + D PO) Take 1 tablet by mouth 2 (two) times daily.    . cetirizine (ZYRTEC) 10 MG tablet Take 10 mg by mouth daily.    Marland Kitchen HYDROcodone-acetaminophen (NORCO/VICODIN) 5-325 MG per tablet Take 1 tablet by mouth every 4 (four) hours as needed. (Patient not  taking: Reported on 01/10/2015) 10 tablet 0  . levothyroxine (SYNTHROID, LEVOTHROID) 100 MCG tablet Take 100 mcg by mouth daily before breakfast.    . LORazepam (ATIVAN) 0.5 MG tablet Take 1 tablet (0.5 mg total) by mouth at bedtime as needed for anxiety. 30 tablet 1  . mometasone-formoterol (DULERA) 200-5 MCG/ACT AERO Inhale 1 puff into the lungs 2 (two) times daily.    . promethazine (PHENERGAN) 25 MG tablet Take 1 tablet (25 mg total) by mouth every 6 (six) hours as needed. (Patient not taking: Reported on 07/13/2014) 20 tablet 0  . SUMAtriptan 6 MG/0.5ML SOAJ Inject 6 mg as directed daily as needed (migraine).     . topiramate (TOPAMAX) 25 MG tablet Take  1tab at bedtime for 7days, then 2tabs at bedtime (Patient not taking: Reported on 01/10/2015) 60 tablet 0   No current facility-administered medications for this visit.      Physical Exam  Blood pressure 119/77, pulse 69, temperature 97.5 F (36.4 C), height 5\' 4"  (1.626 m), weight 133 lb (60.3 kg).  Constitutional: overall normal hygiene, normal nutrition, well developed, normal grooming, normal body habitus. Assistive device:none  Musculoskeletal: gait and station Limp none, muscle tone and strength are normal, no tremors or atrophy is present.  .  Neurological: coordination overall normal.  Deep tendon reflex/nerve stretch intact.  Sensation normal.  Cranial nerves II-XII intact.   Skin:   Normal overall no scars, lesions, ulcers or rashes. No psoriasis.  Psychiatric: Alert and oriented x 3.  Recent memory intact, remote memory unclear.  Normal mood and affect. Well groomed.  Good eye contact.  Cardiovascular: overall no swelling, no varicosities, no edema bilaterally, normal temperatures of the legs and arms, no clubbing, cyanosis and good capillary refill.  Lymphatic: palpation is normal.  Examination of right Upper Extremity is done.  Inspection:   Overall:  Elbow non-tender without crepitus or defects, forearm non-tender without crepitus or defects, wrist non-tender without crepitus or defects, hand non-tender.    Shoulder: with glenohumeral joint tenderness, without effusion.   Upper arm: without swelling and tenderness   Range of motion:   Overall:  Full range of motion of the elbow, full range of motion of wrist and full range of motion in fingers.   Shoulder:  right  145 degrees forward flexion; 120 degrees abduction; 30 degrees internal rotation, 30 degrees external rotation, 15 degrees extension, 40 degrees adduction.   Stability:   Overall:  Shoulder, elbow and wrist stable   Strength and Tone:   Overall full shoulder muscles strength, full upper arm strength and  normal upper arm bulk and tone.   The patient has been educated about the nature of the problem(s) and counseled on treatment options.  The patient appeared to understand what I have discussed and is in agreement with it.  Encounter Diagnoses  Name Primary?  . Pain and swelling of right shoulder Yes  . Incomplete tear of right rotator cuff     PLAN Call if any problems.  Precautions discussed.  Continue current medications.   Return to clinic 2 weeks   Begin PT.  Electronically Signed Darreld McleanWayne Jakeisha Stricker, MD 3/13/20184:16 PM

## 2016-06-27 ENCOUNTER — Ambulatory Visit (HOSPITAL_COMMUNITY): Payer: Managed Care, Other (non HMO)

## 2016-07-03 ENCOUNTER — Ambulatory Visit (INDEPENDENT_AMBULATORY_CARE_PROVIDER_SITE_OTHER): Payer: 59 | Admitting: Orthopaedic Surgery

## 2016-07-03 ENCOUNTER — Encounter: Payer: Self-pay | Admitting: Orthopaedic Surgery

## 2016-07-03 VITALS — BP 111/67 | HR 83 | Temp 97.2°F | Ht 64.0 in | Wt 133.0 lb

## 2016-07-03 DIAGNOSIS — M75111 Incomplete rotator cuff tear or rupture of right shoulder, not specified as traumatic: Secondary | ICD-10-CM | POA: Diagnosis not present

## 2016-07-03 NOTE — Progress Notes (Signed)
Patient [name], female DOB:May 21, 1965, 51 y.o. JWJ:[id number]  Chief Complaint  Patient presents with  . Follow-up    Shoulder pain    HPI  [name] Short is a 51 y.o. female who has right shoulder pain.  She is going to PT and is making slow progress.  She has an incomplete bursal surface tear of the right supraspinatus tendon.  She is not ready to return to work and I will give her a note. HPI  Body mass index is 22.83 kg/m.  ROS  Review of Systems  HENT: Negative for congestion.   Respiratory: Positive for shortness of breath. Negative for cough.   Cardiovascular: Negative for chest pain and leg swelling.  Endocrine: Positive for cold intolerance.  Musculoskeletal: Positive for arthralgias and joint swelling.  Allergic/Immunologic: Positive for environmental allergies.  Neurological: Positive for headaches.  Psychiatric/Behavioral: The patient is nervous/anxious.     Past Medical History:  Diagnosis Date  . Anxiety   . Asthma   . Cluster headaches   . COPD (chronic obstructive pulmonary disease) (HCC)   . COPD (chronic obstructive pulmonary disease) with chronic bronchitis (HCC)   . Hyperlipemia   . Hypothyroidism   . Migraine   . Pneumothorax   . Rectal discharge 07/28/2010  . Shortness of breath   . Sluggishness 07/28/2010    Past Surgical History:  Procedure Laterality Date  . ABDOMINAL HYSTERECTOMY    . CHOLECYSTECTOMY    . COLONOSCOPY N/A 07/30/2013   Procedure: COLONOSCOPY;  Surgeon: Malissa Hippo, MD;  Location: [facility];  Service: Endoscopy;  Laterality: N/A;  930  . LOBECTOMY     right lung   . TUBAL LIGATION      Family History  Problem Relation Age of Onset  . COPD Mother   . Diabetes Mother   . Hyperlipidemia Mother   . Hypertension Mother   . Bipolar disorder Son   . Heart failure Maternal Grandmother   . Hypertension Maternal Grandmother   . Thyroid disease Maternal Grandmother   . Diabetes Paternal  Grandmother   . Alzheimer's disease Paternal Grandmother   . Heart failure Paternal Grandfather   . Hypertension Paternal Grandfather   . Hypertension Father   . Colon cancer Neg Hx     Social History Social History  Substance Use Topics  . Smoking status: Former Smoker    Packs/day: 1.00    Years: 30.00    Types: Cigarettes    Quit date: 02/21/2007  . Smokeless tobacco: Never Used  . Alcohol use No     Comment: no      Current Outpatient Prescriptions  Medication Sig Dispense Refill  . acetaminophen (TYLENOL) 500 MG tablet Take 1,000 mg by mouth every 6 (six) hours as needed for moderate pain.    Marland Kitchen albuterol (VENTOLIN HFA) 108 (90 BASE) MCG/ACT inhaler Inhale 2 puffs into the lungs every 4 (four) hours as needed. For shortness of breath    . aspirin-acetaminophen-caffeine (EXCEDRIN MIGRAINE) 250-250-65 MG tablet Take 2 tablets by mouth every 6 (six) hours as needed for headache or migraine.    . Calcium Carbonate-Vitamin D (CALCIUM 600 + D PO) Take 1 tablet by mouth 2 (two) times daily.    . cetirizine (ZYRTEC) 10 MG tablet Take 10 mg by mouth daily.    Marland Kitchen HYDROcodone-acetaminophen (NORCO/VICODIN) 5-325 MG per tablet Take 1 tablet by mouth every 4 (four) hours as needed. (Patient not taking: Reported on 01/10/2015) 10 tablet 0  . levothyroxine (  SYNTHROID, LEVOTHROID) 100 MCG tablet Take 100 mcg by mouth daily before breakfast.    . LORazepam (ATIVAN) 0.5 MG tablet Take 1 tablet (0.5 mg total) by mouth at bedtime as needed for anxiety. 30 tablet 1  . mometasone-formoterol (DULERA) 200-5 MCG/ACT AERO Inhale 1 puff into the lungs 2 (two) times daily.    . promethazine (PHENERGAN) 25 MG tablet Take 1 tablet (25 mg total) by mouth every 6 (six) hours as needed. (Patient not taking: Reported on 07/13/2014) 20 tablet 0  . SUMAtriptan 6 MG/0.5ML SOAJ Inject 6 mg as directed daily as needed (migraine).     . topiramate (TOPAMAX) 25 MG tablet Take 1tab at bedtime for 7days, then 2tabs at  bedtime (Patient not taking: Reported on 01/10/2015) 60 tablet 0   No current facility-administered medications for this visit.      Physical Exam  Blood pressure 111/67, pulse 83, temperature 97.2 F (36.2 C), height 5\' 4"  (1.626 m), weight 133 lb (60.3 kg).  Constitutional: overall normal hygiene, normal nutrition, well developed, normal grooming, normal body habitus. Assistive device:none  Musculoskeletal: gait and station Limp none, muscle tone and strength are normal, no tremors or atrophy is present.  .  Neurological: coordination overall normal.  Deep tendon reflex/nerve stretch intact.  Sensation normal.  Cranial nerves II-XII intact.   Skin:   Normal overall no scars, lesions, ulcers or rashes. No psoriasis.  Psychiatric: Alert and oriented x 3.  Recent memory intact, remote memory unclear.  Normal mood and affect. Well groomed.  Good eye contact.  Cardiovascular: overall no swelling, no varicosities, no edema bilaterally, normal temperatures of the legs and arms, no clubbing, cyanosis and good capillary refill.  Lymphatic: palpation is normal.  Examination of right Upper Extremity is done.  Inspection:   Overall:  Elbow non-tender without crepitus or defects, forearm non-tender without crepitus or defects, wrist non-tender without crepitus or defects, hand non-tender.    Shoulder: with glenohumeral joint tenderness, without effusion.   Upper arm: without swelling and tenderness   Range of motion:   Overall:  Full range of motion of the elbow, full range of motion of wrist and full range of motion in fingers.   Shoulder:  right  120 degrees forward flexion; 100 degrees abduction; 30 degrees internal rotation, 30 degrees external rotation, 15 degrees extension, 40 degrees adduction.   Stability:   Overall:  Shoulder, elbow and wrist stable   Strength and Tone:   Overall full shoulder muscles strength, full upper arm strength and normal upper arm bulk and tone.   The  patient has been educated about the nature of the problem(s) and counseled on treatment options.  The patient appeared to understand what I have discussed and is in agreement with it.  Encounter Diagnosis  Name Primary?  . Incomplete tear of right rotator cuff Yes    PLAN Call if any problems.  Precautions discussed.  Continue current medications.   Return to clinic 3 weeks   Stay out of work.  Continue PT.  Electronically Signed Darreld McleanWayne Ponciano Shealy, MD 3/27/20183:32 PM

## 2016-07-03 NOTE — Patient Instructions (Signed)
Out of work next three weeks. 

## 2016-07-19 ENCOUNTER — Encounter: Payer: Self-pay | Admitting: General Surgery

## 2016-07-19 ENCOUNTER — Ambulatory Visit (INDEPENDENT_AMBULATORY_CARE_PROVIDER_SITE_OTHER): Payer: 59 | Admitting: General Surgery

## 2016-07-19 VITALS — BP 112/66 | HR 82 | Temp 98.9°F | Resp 18 | Ht 66.0 in | Wt 135.0 lb

## 2016-07-19 DIAGNOSIS — K432 Incisional hernia without obstruction or gangrene: Secondary | ICD-10-CM

## 2016-07-19 NOTE — Patient Instructions (Signed)
Ventral Hernia A ventral hernia is a bulge of tissue from inside the abdomen that pushes through a weak area of the muscles that form the front wall of the abdomen. The tissues inside the abdomen are inside a sac (peritoneum). These tissues include the small intestine, large intestine, and the fatty tissue that covers the intestines (omentum). Sometimes, the bulge that forms a hernia contains intestines. Other hernias contain only fat. Ventral hernias do not go away without surgical treatment. There are several types of ventral hernias. You may have:  A hernia at an incision site from previous abdominal surgery (incisional hernia).  A hernia just above the belly button (epigastric hernia), or at the belly button (umbilical hernia). These types of hernias can develop from heavy lifting or straining.  A hernia that comes and goes (reducible hernia). It may be visible only when you lift or strain. This type of hernia can be pushed back into the abdomen (reduced).  A hernia that traps abdominal tissue inside the hernia (incarcerated hernia). This type of hernia does not reduce.  A hernia that cuts off blood flow to the tissues inside the hernia (strangulated hernia). The tissues can start to die if this happens. This is a very painful bulge that cannot be reduced. A strangulated hernia is a medical emergency. What are the causes? This condition is caused by abdominal tissue putting pressure on an area of weakness in the abdominal muscles. What increases the risk? The following factors may make you more likely to develop this condition:  Being female.  Being 60 or older.  Being overweight or obese.  Having had previous abdominal surgery, especially if there was an infection after surgery.  Having had an injury to the abdominal wall.  Having had several pregnancies.  Having a buildup of fluid inside the abdomen (ascites). What are the signs or symptoms? The only symptom of a ventral hernia  may be a painless bulge in the abdomen. A reducible hernia may be visible only when you strain, cough, or lift. Other symptoms may include:  Dull pain.  A feeling of pressure. Signs and symptoms of a strangulated hernia may include:  Increasing pain.  Nausea and vomiting.  Pain when pressing on the hernia.  The skin over the hernia turning red or purple.  Constipation.  Blood in the stool (feces). How is this diagnosed? This condition may be diagnosed based on:  Your symptoms.  Your medical history.  A physical exam. You may be asked to cough or strain while standing. These actions increase the pressure inside your abdomen and force the hernia through the opening in your muscles. Your health care provider may try to reduce the hernia by pressing on it.  Imaging studies, such as an ultrasound or CT scan. How is this treated? This condition is treated with surgery. If you have a strangulated hernia, surgery is done as soon as possible. If your hernia is small and not incarcerated, you may be asked to lose some weight before surgery. Follow these instructions at home:  Follow instructions from your health care provider about eating or drinking restrictions.  If you are overweight, your health care provider may recommend that you increase your activity level and eat a healthier diet.  Do not lift anything that is heavier than 10 lb (4.5 kg).  Return to your normal activities as told by your health care provider. Ask your health care provider what activities are safe for you. You may need to avoid activities that   increase pressure on your hernia.  Take over-the-counter and prescription medicines only as told by your health care provider.  Keep all follow-up visits as told by your health care provider. This is important. Contact a health care provider if:  Your hernia gets larger.  Your hernia becomes painful. Get help right away if:  Your hernia becomes increasingly  painful.  You have pain along with any of the following:  Changes in skin color in the area of the hernia.  Nausea.  Vomiting.  Fever. Summary  A ventral hernia is a bulge of tissue from inside the abdomen that pushes through a weak area of the muscles that form the front wall of the abdomen.  This condition is treated with surgery, which may be urgent depending on your hernia.  Do not lift anything that is heavier than 10 lb (4.5 kg), and follow activity instructions from your health care provider. This information is not intended to replace advice given to you by your health care provider. Make sure you discuss any questions you have with your health care provider. Document Released: 03/12/2012 Document Revised: 11/11/2015 Document Reviewed: 11/11/2015 Elsevier Interactive Patient Education  2017 [employer]  

## 2016-07-19 NOTE — Progress Notes (Signed)
[name]; [id number]; 1965-06-28   HPI Patient is a 51 year old white female who presents with a swelling in the upper abdomen. It is made worse with straining. It is been present for some time now, but seems to be increasing in size and causing her discomfort. She states she feels a golf ball develop just under her breast bone. Her pain is 4 out of 10 when this occurs. No nausea or vomiting have been noted. She has had both chest and abdominal surgeries in the past. She is currently undergoing physical therapy for a torn right rotator cuff. She will be seeing Dr. Hilda Lias next week to determine whether she is due to have surgery or continue physical therapy. Past Medical History:  Diagnosis Date  . Anxiety   . Asthma   . Cluster headaches   . COPD (chronic obstructive pulmonary disease) (HCC)   . COPD (chronic obstructive pulmonary disease) with chronic bronchitis (HCC)   . Hyperlipemia   . Hypothyroidism   . Migraine   . Pneumothorax   . Rectal discharge 07/28/2010  . Shortness of breath   . Sluggishness 07/28/2010    Past Surgical History:  Procedure Laterality Date  . ABDOMINAL HYSTERECTOMY    . CHOLECYSTECTOMY    . COLONOSCOPY N/A 07/30/2013   Procedure: COLONOSCOPY;  Surgeon: Malissa Hippo, MD;  Location: [facility];  Service: Endoscopy;  Laterality: N/A;  930  . LOBECTOMY     right lung   . TUBAL LIGATION      Family History  Problem Relation Age of Onset  . COPD Mother   . Diabetes Mother   . Hyperlipidemia Mother   . Hypertension Mother   . Bipolar disorder Son   . Heart failure Maternal Grandmother   . Hypertension Maternal Grandmother   . Thyroid disease Maternal Grandmother   . Diabetes Paternal Grandmother   . Alzheimer's disease Paternal Grandmother   . Heart failure Paternal Grandfather   . Hypertension Paternal Grandfather   . Hypertension Father   . Colon cancer Neg Hx     Current Outpatient Prescriptions on File Prior to Visit   Medication Sig Dispense Refill  . acetaminophen (TYLENOL) 500 MG tablet Take 1,000 mg by mouth every 6 (six) hours as needed for moderate pain.    Marland Kitchen albuterol (VENTOLIN HFA) 108 (90 BASE) MCG/ACT inhaler Inhale 2 puffs into the lungs every 4 (four) hours as needed. For shortness of breath    . aspirin-acetaminophen-caffeine (EXCEDRIN MIGRAINE) 250-250-65 MG tablet Take 2 tablets by mouth every 6 (six) hours as needed for headache or migraine.    . Calcium Carbonate-Vitamin D (CALCIUM 600 + D PO) Take 1 tablet by mouth 2 (two) times daily.    . cetirizine (ZYRTEC) 10 MG tablet Take 10 mg by mouth daily.    Marland Kitchen HYDROcodone-acetaminophen (NORCO/VICODIN) 5-325 MG per tablet Take 1 tablet by mouth every 4 (four) hours as needed. (Patient not taking: Reported on 01/10/2015) 10 tablet 0  . levothyroxine (SYNTHROID, LEVOTHROID) 100 MCG tablet Take 100 mcg by mouth daily before breakfast.    . LORazepam (ATIVAN) 0.5 MG tablet Take 1 tablet (0.5 mg total) by mouth at bedtime as needed for anxiety. 30 tablet 1  . mometasone-formoterol (DULERA) 200-5 MCG/ACT AERO Inhale 1 puff into the lungs 2 (two) times daily.    . promethazine (PHENERGAN) 25 MG tablet Take 1 tablet (25 mg total) by mouth every 6 (six) hours as needed. (Patient not taking: Reported on 07/13/2014) 20 tablet  0  . SUMAtriptan 6 MG/0.5ML SOAJ Inject 6 mg as directed daily as needed (migraine).     . topiramate (TOPAMAX) 25 MG tablet Take 1tab at bedtime for 7days, then 2tabs at bedtime (Patient not taking: Reported on 01/10/2015) 60 tablet 0   No current facility-administered medications on file prior to visit.     Allergies: Penicillin  History  Alcohol Use No    Comment: no    History  Smoking Status  . Former Smoker  . Packs/day: 1.00  . Years: 30.00  . Types: Cigarettes  . Quit date: 02/21/2007  Smokeless Tobacco  . Never Used    Review of Systems  Constitutional: Negative.   HENT: Negative.   Eyes: Negative.   Respiratory:  Positive for shortness of breath and wheezing. Negative for cough.   Cardiovascular: Negative.   Gastrointestinal: Positive for heartburn.  Genitourinary: Negative.   Musculoskeletal: Positive for joint pain.  Skin: Negative.   Neurological: Negative.   Endo/Heme/Allergies: Negative.   Psychiatric/Behavioral: Negative.     Objective   Vitals:   07/19/16 1051  BP: 112/66  Pulse: 82  Resp: 18  Temp: 98.9 F (37.2 C)    Physical Exam  Constitutional: She is oriented to person, place, and time and well-developed, well-nourished, and in no distress.  HENT:  Head: Normocephalic and atraumatic.  Neck: Normal range of motion. Neck supple.  Cardiovascular: Normal rate, regular rhythm and normal heart sounds.   No murmur heard. Pulmonary/Chest: Effort normal and breath sounds normal. She has no wheezes. She has no rales.  Abdominal: Soft. Bowel sounds are normal. She exhibits no distension. There is no tenderness.  Epigastric hernia which is easily reducible present below a small surgical scar. This appears to be a trocar site. The defect measures approximately 2-3 cm in size.  Neurological: She is alert and oriented to person, place, and time.  Skin: Skin is warm and dry.  Vitals reviewed.   Assessment  Incisional hernia Plan   We did discuss an incisional herniorrhaphy with mesh repair in the future. She wants to wait and see what her treatment plan is with Dr. Hilda Lias. The risks and benefits of the procedure including bleeding, infection, mesh use, and the possibility of recurrence of the hernia were fully explained to the patient, who gave informed consent. If she decided to have her rotator cuff fixed, her epigastric hernia could be fixed at the same time.

## 2016-07-24 ENCOUNTER — Encounter: Payer: Self-pay | Admitting: Orthopaedic Surgery

## 2016-07-24 ENCOUNTER — Ambulatory Visit (INDEPENDENT_AMBULATORY_CARE_PROVIDER_SITE_OTHER): Payer: 59 | Admitting: Orthopaedic Surgery

## 2016-07-24 VITALS — BP 110/80 | HR 73 | Temp 97.7°F | Ht 65.0 in | Wt 133.0 lb

## 2016-07-24 DIAGNOSIS — M75111 Incomplete rotator cuff tear or rupture of right shoulder, not specified as traumatic: Secondary | ICD-10-CM | POA: Diagnosis not present

## 2016-07-24 NOTE — Patient Instructions (Signed)
Out of work 

## 2016-07-24 NOTE — Progress Notes (Signed)
Patient [name] TRISTAN BRAMBLE, female DOB:Jul 28, 1965, 51 y.o. JWJ:[id number]  Chief Complaint  Patient presents with  . Follow-up    Right shoulder    HPI  [name] is a 51 y.o. female who has chronic pain and a small bursal change of the right supraspinatus tendon by MRI.  She has been going to PT and is making slow but steady progress.  She has more PT scheduled.  She cannot return to work yet.  She is worrying about this taking so long.  She is taking her medicine and having no new trauma. HPI  Body mass index is 22.13 kg/m.  ROS  Review of Systems  HENT: Negative for congestion.   Respiratory: Positive for shortness of breath. Negative for cough.   Cardiovascular: Negative for chest pain and leg swelling.  Endocrine: Positive for cold intolerance.  Musculoskeletal: Positive for arthralgias and joint swelling.  Allergic/Immunologic: Positive for environmental allergies.  Neurological: Positive for headaches.  Psychiatric/Behavioral: The patient is nervous/anxious.     Past Medical History:  Diagnosis Date  . Anxiety   . Asthma   . Cluster headaches   . COPD (chronic obstructive pulmonary disease) (HCC)   . COPD (chronic obstructive pulmonary disease) with chronic bronchitis (HCC)   . Hyperlipemia   . Hypothyroidism   . Migraine   . Pneumothorax   . Rectal discharge 07/28/2010  . Shortness of breath   . Sluggishness 07/28/2010    Past Surgical History:  Procedure Laterality Date  . ABDOMINAL HYSTERECTOMY    . CHOLECYSTECTOMY    . COLONOSCOPY N/A 07/30/2013   Procedure: COLONOSCOPY;  Surgeon: Malissa Hippo, MD;  Location: [facility];  Service: Endoscopy;  Laterality: N/A;  930  . LOBECTOMY     right lung   . TUBAL LIGATION      Family History  Problem Relation Age of Onset  . COPD Mother   . Diabetes Mother   . Hyperlipidemia Mother   . Hypertension Mother   . Bipolar disorder Son   . Heart failure Maternal Grandmother   . Hypertension  Maternal Grandmother   . Thyroid disease Maternal Grandmother   . Diabetes Paternal Grandmother   . Alzheimer's disease Paternal Grandmother   . Heart failure Paternal Grandfather   . Hypertension Paternal Grandfather   . Hypertension Father   . Colon cancer Neg Hx     Social History Social History  Substance Use Topics  . Smoking status: Former Smoker    Packs/day: 1.00    Years: 30.00    Types: Cigarettes    Quit date: 02/21/2007  . Smokeless tobacco: Never Used  . Alcohol use No     Comment: no      Current Outpatient Prescriptions  Medication Sig Dispense Refill  . acetaminophen (TYLENOL) 500 MG tablet Take 1,000 mg by mouth every 6 (six) hours as needed for moderate pain.    Marland Kitchen albuterol (VENTOLIN HFA) 108 (90 BASE) MCG/ACT inhaler Inhale 2 puffs into the lungs every 4 (four) hours as needed. For shortness of breath    . aspirin-acetaminophen-caffeine (EXCEDRIN MIGRAINE) 250-250-65 MG tablet Take 2 tablets by mouth every 6 (six) hours as needed for headache or migraine.    . Calcium Carbonate-Vitamin D (CALCIUM 600 + D PO) Take 1 tablet by mouth 2 (two) times daily.    . cetirizine (ZYRTEC) 10 MG tablet Take 10 mg by mouth daily.    Marland Kitchen HYDROcodone-acetaminophen (NORCO/VICODIN) 5-325 MG per tablet Take 1 tablet by mouth  every 4 (four) hours as needed. (Patient not taking: Reported on 01/10/2015) 10 tablet 0  . levothyroxine (SYNTHROID, LEVOTHROID) 100 MCG tablet Take 100 mcg by mouth daily before breakfast.    . LORazepam (ATIVAN) 0.5 MG tablet Take 1 tablet (0.5 mg total) by mouth at bedtime as needed for anxiety. 30 tablet 1  . mometasone-formoterol (DULERA) 200-5 MCG/ACT AERO Inhale 1 puff into the lungs 2 (two) times daily.    . promethazine (PHENERGAN) 25 MG tablet Take 1 tablet (25 mg total) by mouth every 6 (six) hours as needed. (Patient not taking: Reported on 07/13/2014) 20 tablet 0  . SUMAtriptan 6 MG/0.5ML SOAJ Inject 6 mg as directed daily as needed (migraine).     .  topiramate (TOPAMAX) 25 MG tablet Take 1tab at bedtime for 7days, then 2tabs at bedtime (Patient not taking: Reported on 01/10/2015) 60 tablet 0   No current facility-administered medications for this visit.      Physical Exam  Blood pressure 110/80, pulse 73, temperature 97.7 F (36.5 C), height  (1.651 m), weight 133 lb (60.3 kg).  Constitutional: overall normal hygiene, normal nutrition, well developed, normal grooming, normal body habitus. Assistive device:none  Musculoskeletal: gait and station Limp none, muscle tone and strength are normal, no tremors or atrophy is present.  .  Neurological: coordination overall normal.  Deep tendon reflex/nerve stretch intact.  Sensation normal.  Cranial nerves II-XII intact.   Skin:   Normal overall no scars, lesions, ulcers or rashes. No psoriasis.  Psychiatric: Alert and oriented x 3.  Recent memory intact, remote memory unclear.  Normal mood and affect. Well groomed.  Good eye contact.  Cardiovascular: overall no swelling, no varicosities, no edema bilaterally, normal temperatures of the legs and arms, no clubbing, cyanosis and good capillary refill.  Lymphatic: palpation is normal.  Examination of right Upper Extremity is done.  Inspection:   Overall:  Elbow non-tender without crepitus or defects, forearm non-tender without crepitus or defects, wrist non-tender without crepitus or defects, hand non-tender.    Shoulder: with glenohumeral joint tenderness, without effusion.   Upper arm: without swelling and tenderness   Range of motion:   Overall:  Full range of motion of the elbow, full range of motion of wrist and full range of motion in fingers.   Shoulder:  right  145 degrees forward flexion; 120 degrees abduction; 35 degrees internal rotation, 35 degrees external rotation, 15 degrees extension, 40 degrees adduction.   Stability:   Overall:  Shoulder, elbow and wrist stable   Strength and Tone:   Overall full shoulder muscles  strength, full upper arm strength and normal upper arm bulk and tone.  The patient has been educated about the nature of the problem(s) and counseled on treatment options.  The patient appeared to understand what I have discussed and is in agreement with it.  Encounter Diagnosis  Name Primary?  . Incomplete tear of right rotator cuff Yes    PLAN Call if any problems.  Precautions discussed.  Continue current medications.   Return to clinic 3 weeks   Remain out of work.  Electronically Signed Darreld Mclean, MD 4/17/20189:21 AM

## 2016-07-25 NOTE — Patient Instructions (Signed)
[name]  07/25/2016     @   Your procedure is scheduled on  07/30/2016   Report to [facility] at  715  A.M.  Call this number if you have problems the morning of surgery:  [phone]   Remember:  Do not eat food or drink liquids after midnight.  Take these medicines the morning of surgery with A SIP OF WATER  Zyrtec, synthroid, protonix. Take your inhalers before you come.   Do not wear jewelry, make-up or nail polish.  Do not wear lotions, powders, or perfumes, or deoderant.  Do not shave 48 hours prior to surgery.  Men may shave face and neck.  Do not bring valuables to the hospital.  [facility] is not responsible for any belongings or valuables.  Contacts, dentures or bridgework may not be worn into surgery.  Leave your suitcase in the car.  After surgery it may be brought to your room.  For patients admitted to the hospital, discharge time will be determined by your treatment team.  Patients discharged the day of surgery will not be allowed to drive home.   Name and phone number of your driver:   family Special instructions:  None  Please read over the following fact sheets that you were given. Anesthesia Post-op Instructions and Care and Recovery After Surgery       Open Hernia Repair, Adult Open hernia repair is a surgical procedure to fix a hernia. A hernia occurs when an internal organ or tissue pushes out through a weak spot in the abdominal wall muscles. Hernias commonly occur in the groin and around the navel. Most hernias tend to get worse over time. Often, surgery is done to prevent the hernia from becoming bigger, uncomfortable, or an emergency. Emergency surgery may be needed if abdominal contents get stuck in the opening (incarcerated hernia) or the blood supply gets cut off (strangulated hernia). In an open repair, an incision is made in the abdomen to perform the surgery. Tell a health care provider  about:  Any allergies you have.  All medicines you are taking, including vitamins, herbs, eye drops, creams, and over-the-counter medicines.  Any problems you or family members have had with anesthetic medicines.  Any blood or bone disorders you have.  Any surgeries you have had.  Any medical conditions you have, including any recent cold or flu symptoms.  Whether you are pregnant or may be pregnant. What are the risks? Generally, this is a safe procedure. However, problems may occur, including:  Long-lasting (chronic) pain.  Bleeding.  Infection.  Damage to the testicle. This can cause shrinking or swelling.  Damage to the bladder, blood vessels, intestine, or nerves near the hernia.  Trouble passing urine.  Allergic reactions to medicines.  Return of the hernia. What happens before the procedure? Staying hydrated  Follow instructions from your health care provider about hydration, which may include:  Up to 2 hours before the procedure - you may continue to drink clear liquids, such as water, clear fruit juice, black coffee, and plain tea. Eating and drinking restrictions  Follow instructions from your health care provider about eating and drinking, which may include:  8 hours before the procedure - stop eating heavy meals or foods such as meat, fried foods, or fatty foods.  6 hours before the procedure - stop eating light meals or foods, such as toast or cereal.  6 hours before the procedure - stop  drinking milk or drinks that contain milk.  2 hours before the procedure - stop drinking clear liquids. Medicines   Ask your health care provider about:  Changing or stopping your regular medicines. This is especially important if you are taking diabetes medicines or blood thinners.  Taking medicines such as aspirin and ibuprofen. These medicines can thin your blood. Do not take these medicines before your procedure if your health care provider instructs you not  to.  You may be given antibiotic medicine to help prevent infection. General instructions   You may have blood tests or imaging studies.  Ask your health care provider how your surgical site will be marked or identified.  If you smoke, do not smoke for at least 2 weeks before your procedure or for as long as told by your health care provider.  Let your health care provider know if you develop a cold or any infection before your surgery.  Plan to have someone take you home from the hospital or clinic.  If you will be going home right after the procedure, plan to have someone with you for 24 hours. What happens during the procedure?  To reduce your risk of infection:  Your health care team will wash or sanitize their hands.  Your skin will be washed with soap.  Hair may be removed from the surgical area.  An IV tube will be inserted into one of your veins.  You will be given one or more of the following:  A medicine to help you relax (sedative).  A medicine to numb the area (local anesthetic).  A medicine to make you fall asleep (general anesthetic).  Your surgeon will make an incision over the hernia.  The tissues of the hernia will be moved back into place.  The edges of the hernia may be stitched together.  The opening in the abdominal muscles will be closed with stitches (sutures). Or, your surgeon will place a mesh patch made of manmade (synthetic) material over the opening.  The incision will be closed.  A bandage (dressing) may be placed over the incision. The procedure may vary among health care providers and hospitals. What happens after the procedure?  Your blood pressure, heart rate, breathing rate, and blood oxygen level will be monitored until the medicines you were given have worn off.  You may be given medicine for pain.  Do not drive for 24 hours if you received a sedative. This information is not intended to replace advice given to you by your  health care provider. Make sure you discuss any questions you have with your health care provider. Document Released: 09/19/2000 Document Revised: 10/14/2015 Document Reviewed: 09/07/2015 Elsevier Interactive Patient Education  2017 [place] Repair, Adult, Care After These instructions give you information about caring for yourself after your procedure. Your doctor may also give you more specific instructions. If you have problems or questions, contact your doctor. Follow these instructions at home: Surgical cut (incision) care    Follow instructions from your doctor about how to take care of your surgical cut area. Make sure you:  Wash your hands with soap and water before you change your bandage (dressing). If you cannot use soap and water, use hand sanitizer.  Change your bandage as told by your doctor.  Leave stitches (sutures), skin glue, or skin tape (adhesive) strips in place. They may need to stay in place for 2 weeks or longer. If tape strips get loose and curl up,  you may trim the loose edges. Do not remove tape strips completely unless your doctor says it is okay.  Check your surgical cut every day for signs of infection. Check for:  More redness, swelling, or pain.  More fluid or blood.  Warmth.  Pus or a bad smell. Activity   Do not drive or use heavy machinery while taking prescription pain medicine. Do not drive until your doctor says it is okay.  Until your doctor says it is okay:  Do not lift anything that is heavier than 10 lb (4.5 kg).  Do not play contact sports.  Return to your normal activities as told by your doctor. Ask your doctor what activities are safe. General instructions   To prevent or treat having a hard time pooping (constipation) while you are taking prescription pain medicine, your doctor may recommend that you:  Drink enough fluid to keep your pee (urine) clear or pale yellow.  Take over-the-counter or prescription  medicines.  Eat foods that are high in fiber, such as fresh fruits and vegetables, whole grains, and beans.  Limit foods that are high in fat and processed sugars, such as fried and sweet foods.  Take over-the-counter and prescription medicines only as told by your doctor.  Do not take baths, swim, or use a hot tub until your doctor says it is okay.  Keep all follow-up visits as told by your doctor. This is important. Contact a doctor if:  You develop a rash.  You have more redness, swelling, or pain around your surgical cut.  You have more fluid or blood coming from your surgical cut.  Your surgical cut feels warm to the touch.  You have pus or a bad smell coming from your surgical cut.  You have a fever or chills.  You have blood in your poop (stool).  You have not pooped in 2-3 days.  Medicine does not help your pain. Get help right away if:  You have chest pain or you are short of breath.  You feel light-headed.  You feel weak and dizzy (feel faint).  You have very bad pain.  You throw up (vomit) and your pain is worse. This information is not intended to replace advice given to you by your health care provider. Make sure you discuss any questions you have with your health care provider. Document Released: 04/16/2014 Document Revised: 10/14/2015 Document Reviewed: 09/07/2015 Elsevier Interactive Patient Education  2017 [employer] PATIENT INSTRUCTIONS POST-ANESTHESIA  IMMEDIATELY FOLLOWING SURGERY:  Do not drive or operate machinery for the first twenty four hours after surgery.  Do not make any important decisions for twenty four hours after surgery or while taking narcotic pain medications or sedatives.  If you develop intractable nausea and vomiting or a severe headache please notify your doctor immediately.  FOLLOW-UP:  Please make an appointment with your surgeon as instructed. You do not need to follow up with anesthesia unless specifically instructed to  do so.  WOUND CARE INSTRUCTIONS (if applicable):  Keep a dry clean dressing on the anesthesia/puncture wound site if there is drainage.  Once the wound has quit draining you may leave it open to air.  Generally you should leave the bandage intact for twenty four hours unless there is drainage.  If the epidural site drains for more than 36-48 hours please call the anesthesia department.  QUESTIONS?:  Please feel free to call your physician or the hospital operator if you have any questions, and they will be happy  to assist you.

## 2016-07-26 ENCOUNTER — Encounter (HOSPITAL_COMMUNITY)
Admission: RE | Admit: 2016-07-26 | Discharge: 2016-07-26 | Disposition: A | Discharge status: Home / Self Care | Payer: 59 | Source: Ambulatory Visit | Attending: General Surgery | Admitting: General Surgery

## 2016-07-26 ENCOUNTER — Encounter (HOSPITAL_COMMUNITY): Payer: Self-pay

## 2016-07-26 ENCOUNTER — Other Ambulatory Visit: Payer: Self-pay

## 2016-07-26 DIAGNOSIS — Z01810 Encounter for preprocedural cardiovascular examination: Secondary | ICD-10-CM | POA: Insufficient documentation

## 2016-07-26 DIAGNOSIS — K432 Incisional hernia without obstruction or gangrene: Secondary | ICD-10-CM | POA: Insufficient documentation

## 2016-07-26 DIAGNOSIS — Z01812 Encounter for preprocedural laboratory examination: Secondary | ICD-10-CM | POA: Insufficient documentation

## 2016-07-26 HISTORY — DX: Gastro-esophageal reflux disease without esophagitis: K21.9

## 2016-07-26 HISTORY — DX: Sleep apnea, unspecified: G47.30

## 2016-07-26 HISTORY — DX: Hypoglycemia, unspecified: E16.2

## 2016-07-26 LAB — BASIC METABOLIC PANEL
Anion gap: 8 (ref 5–15)
BUN: 14 mg/dL (ref 6–20)
CO2: 28 mmol/L (ref 22–32)
Calcium: 9.9 mg/dL (ref 8.9–10.3)
Chloride: 103 mmol/L (ref 101–111)
Creatinine, Ser: 0.87 mg/dL (ref 0.44–1.00)
GFR calc Af Amer: >60 mL/min (ref >60)
GFR calc non Af Amer: >60 mL/min (ref >60)
Glucose, Bld: 79 mg/dL (ref 65–99)
Potassium: 3.8 mmol/L (ref 3.5–5.1)
Sodium: 139 mmol/L (ref 135–145)

## 2016-07-26 LAB — CBC WITH DIFFERENTIAL/PLATELET
Basophils Absolute: 0 10*3/uL (ref 0.0–0.1)
Basophils Relative: 0 %
Eosinophils Absolute: 0.7 10*3/uL (ref 0.0–0.7)
Eosinophils Relative: 8 %
HCT: 35.7 % — ABNORMAL LOW (ref 36.0–46.0)
Hemoglobin: 12 g/dL (ref 12.0–15.0)
Lymphocytes Relative: 35 %
Lymphs Abs: 3.4 10*3/uL (ref 0.7–4.0)
MCH: 30.8 pg (ref 26.0–34.0)
MCHC: 33.6 g/dL (ref 30.0–36.0)
MCV: 91.8 fL (ref 78.0–100.0)
Monocytes Absolute: 0.7 10*3/uL (ref 0.1–1.0)
Monocytes Relative: 7 %
Neutro Abs: 4.9 10*3/uL (ref 1.7–7.7)
Neutrophils Relative %: 50 %
Platelets: 311 10*3/uL (ref 150–400)
RBC: 3.89 MIL/uL (ref 3.87–5.11)
RDW: 12.3 % (ref 11.5–15.5)
WBC: 9.7 10*3/uL (ref 4.0–10.5)

## 2016-07-26 NOTE — H&P (Signed)
HPI Patient is a 51 year old white female who presents with a swelling in the upper abdomen. It is made worse with straining. It is been present for some time now, but seems to be increasing in size and causing her discomfort. She states she feels a golf ball develop just under her breast bone. Her pain is 4 out of 10 when this occurs. No nausea or vomiting have been noted. She has had both chest and abdominal surgeries in the past. She is currently undergoing physical therapy for a torn right rotator cuff. She will be seeing Dr. Hilda Lias next week to determine whether she is due to have surgery or continue physical therapy. Past Medical History:  Diagnosis Date  . Anxiety   . Asthma   . Cluster headaches   . COPD (chronic obstructive pulmonary disease) (HCC)   . COPD (chronic obstructive pulmonary disease) with chronic bronchitis (HCC)   . Hyperlipemia   . Hypothyroidism   . Migraine   . Pneumothorax   . Rectal discharge 07/28/2010  . Shortness of breath   . Sluggishness 07/28/2010         Past Surgical History:  Procedure Laterality Date  . ABDOMINAL HYSTERECTOMY    . CHOLECYSTECTOMY    . COLONOSCOPY N/A 07/30/2013   Procedure: COLONOSCOPY;  Surgeon: Malissa Hippo, MD;  Location: [facility];  Service: Endoscopy;  Laterality: N/A;  930  . LOBECTOMY     right lung   . TUBAL LIGATION           Family History  Problem Relation Age of Onset  . COPD Mother   . Diabetes Mother   . Hyperlipidemia Mother   . Hypertension Mother   . Bipolar disorder Son   . Heart failure Maternal Grandmother   . Hypertension Maternal Grandmother   . Thyroid disease Maternal Grandmother   . Diabetes Paternal Grandmother   . Alzheimer's disease Paternal Grandmother   . Heart failure Paternal Grandfather   . Hypertension Paternal Grandfather   . Hypertension Father   . Colon cancer Neg Hx           Current Outpatient Prescriptions on File Prior to  Visit  Medication Sig Dispense Refill  . acetaminophen (TYLENOL) 500 MG tablet Take 1,000 mg by mouth every 6 (six) hours as needed for moderate pain.    Marland Kitchen albuterol (VENTOLIN HFA) 108 (90 BASE) MCG/ACT inhaler Inhale 2 puffs into the lungs every 4 (four) hours as needed. For shortness of breath    . aspirin-acetaminophen-caffeine (EXCEDRIN MIGRAINE) 250-250-65 MG tablet Take 2 tablets by mouth every 6 (six) hours as needed for headache or migraine.    . Calcium Carbonate-Vitamin D (CALCIUM 600 + D PO) Take 1 tablet by mouth 2 (two) times daily.    . cetirizine (ZYRTEC) 10 MG tablet Take 10 mg by mouth daily.    Marland Kitchen HYDROcodone-acetaminophen (NORCO/VICODIN) 5-325 MG per tablet Take 1 tablet by mouth every 4 (four) hours as needed. (Patient not taking: Reported on 01/10/2015) 10 tablet 0  . levothyroxine (SYNTHROID, LEVOTHROID) 100 MCG tablet Take 100 mcg by mouth daily before breakfast.    . LORazepam (ATIVAN) 0.5 MG tablet Take 1 tablet (0.5 mg total) by mouth at bedtime as needed for anxiety. 30 tablet 1  . mometasone-formoterol (DULERA) 200-5 MCG/ACT AERO Inhale 1 puff into the lungs 2 (two) times daily.    . promethazine (PHENERGAN) 25 MG tablet Take 1 tablet (25 mg total) by mouth every 6 (six) hours as  needed. (Patient not taking: Reported on 07/13/2014) 20 tablet 0  . SUMAtriptan 6 MG/0.5ML SOAJ Inject 6 mg as directed daily as needed (migraine).     . topiramate (TOPAMAX) 25 MG tablet Take 1tab at bedtime for 7days, then 2tabs at bedtime (Patient not taking: Reported on 01/10/2015) 60 tablet 0   No current facility-administered medications on file prior to visit.     Allergies: Penicillin       History  Alcohol Use No    Comment: no         History  Smoking Status  . Former Smoker  . Packs/day: 1.00  . Years: 30.00  . Types: Cigarettes  . Quit date: 02/21/2007  Smokeless Tobacco  . Never Used    Review of Systems  Constitutional: Negative.   HENT:  Negative.   Eyes: Negative.   Respiratory: Positive for shortness of breath and wheezing. Negative for cough.   Cardiovascular: Negative.   Gastrointestinal: Positive for heartburn.  Genitourinary: Negative.   Musculoskeletal: Positive for joint pain.  Skin: Negative.   Neurological: Negative.   Endo/Heme/Allergies: Negative.   Psychiatric/Behavioral: Negative.     Objective      Vitals:   07/19/16 1051  BP: 112/66  Pulse: 82  Resp: 18  Temp: 98.9 F (37.2 C)    Physical Exam  Constitutional: She is oriented to person, place, and time and well-developed, well-nourished, and in no distress.  HENT:  Head: Normocephalic and atraumatic.  Neck: Normal range of motion. Neck supple.  Cardiovascular: Normal rate, regular rhythm and normal heart sounds.   No murmur heard. Pulmonary/Chest: Effort normal and breath sounds normal. She has no wheezes. She has no rales.  Abdominal: Soft. Bowel sounds are normal. She exhibits no distension. There is no tenderness.  Epigastric hernia which is easily reducible present below a small surgical scar. This appears to be a trocar site. The defect measures approximately 2-3 cm in size.  Neurological: She is alert and oriented to person, place, and time.  Skin: Skin is warm and dry.  Vitals reviewed.   Assessment  Incisional hernia Plan    Scheduled for incisional herniorrhaphy with mesh on 07/30/2016. The risks and benefits of the procedure including bleeding, infection, mesh use, and the possibility of recurrence of the hernia were fully explained to the patient, who gave informed consent.

## 2016-07-30 ENCOUNTER — Ambulatory Visit (HOSPITAL_COMMUNITY): Payer: 59 | Admitting: Anesthesiology

## 2016-07-30 ENCOUNTER — Encounter (HOSPITAL_COMMUNITY): Payer: Self-pay | Admitting: Anesthesiology

## 2016-07-30 ENCOUNTER — Encounter (HOSPITAL_COMMUNITY)
Admission: RE | Disposition: A | Discharge status: Home / Self Care | Payer: Self-pay | Source: Ambulatory Visit | Attending: General Surgery

## 2016-07-30 ENCOUNTER — Ambulatory Visit (HOSPITAL_COMMUNITY)
Admission: RE | Admit: 2016-07-30 | Discharge: 2016-07-30 | Disposition: A | Discharge status: Home / Self Care | Payer: 59 | Source: Ambulatory Visit | Attending: General Surgery | Admitting: General Surgery

## 2016-07-30 DIAGNOSIS — J449 Chronic obstructive pulmonary disease, unspecified: Secondary | ICD-10-CM | POA: Diagnosis not present

## 2016-07-30 DIAGNOSIS — Z9049 Acquired absence of other specified parts of digestive tract: Secondary | ICD-10-CM | POA: Insufficient documentation

## 2016-07-30 DIAGNOSIS — G4730 Sleep apnea, unspecified: Secondary | ICD-10-CM | POA: Insufficient documentation

## 2016-07-30 DIAGNOSIS — F419 Anxiety disorder, unspecified: Secondary | ICD-10-CM | POA: Diagnosis not present

## 2016-07-30 DIAGNOSIS — Z79899 Other long term (current) drug therapy: Secondary | ICD-10-CM | POA: Insufficient documentation

## 2016-07-30 DIAGNOSIS — E039 Hypothyroidism, unspecified: Secondary | ICD-10-CM | POA: Diagnosis not present

## 2016-07-30 DIAGNOSIS — K219 Gastro-esophageal reflux disease without esophagitis: Secondary | ICD-10-CM | POA: Diagnosis not present

## 2016-07-30 DIAGNOSIS — Z87891 Personal history of nicotine dependence: Secondary | ICD-10-CM | POA: Diagnosis not present

## 2016-07-30 DIAGNOSIS — K432 Incisional hernia without obstruction or gangrene: Secondary | ICD-10-CM | POA: Diagnosis not present

## 2016-07-30 HISTORY — PX: INCISIONAL HERNIA REPAIR: SHX193

## 2016-07-30 LAB — GLUCOSE, CAPILLARY
Glucose-Capillary: 86 mg/dL (ref 65–99)
Glucose-Capillary: 132 mg/dL — ABNORMAL HIGH (ref 65–99)

## 2016-07-30 SURGERY — REPAIR, HERNIA, INCISIONAL
Anesthesia: General | Site: Abdomen

## 2016-07-30 MED ORDER — ROCURONIUM BROMIDE 50 MG/5ML IV SOLN
INTRAVENOUS | Status: AC
  Filled 2016-07-30: qty 1

## 2016-07-30 MED ORDER — POVIDONE-IODINE 10 % EX OINT
TOPICAL_OINTMENT | CUTANEOUS | Status: AC
  Filled 2016-07-30: qty 1

## 2016-07-30 MED ORDER — BUPIVACAINE LIPOSOME 1.3 % IJ SUSP
INTRAMUSCULAR | Status: DC | PRN
  Administered 2016-07-30: 12 mL

## 2016-07-30 MED ORDER — ONDANSETRON HCL 4 MG/2ML IJ SOLN
INTRAMUSCULAR | Status: AC
  Filled 2016-07-30: qty 2

## 2016-07-30 MED ORDER — VANCOMYCIN HCL IN DEXTROSE 1-5 GM/200ML-% IV SOLN
INTRAVENOUS | Status: AC
  Filled 2016-07-30: qty 200

## 2016-07-30 MED ORDER — DEXTROSE 50 % IV SOLN
INTRAVENOUS | Status: AC
  Filled 2016-07-30: qty 50

## 2016-07-30 MED ORDER — VANCOMYCIN HCL IN DEXTROSE 1-5 GM/200ML-% IV SOLN
1000.0000 mg | Freq: Once | INTRAVENOUS | Status: AC
  Administered 2016-07-30: 1000 mg via INTRAVENOUS

## 2016-07-30 MED ORDER — OXYCODONE-ACETAMINOPHEN 5-325 MG PO TABS
1.0000 | ORAL_TABLET | Freq: Once | ORAL | Status: AC
  Administered 2016-07-30: 1 via ORAL

## 2016-07-30 MED ORDER — BUPIVACAINE LIPOSOME 1.3 % IJ SUSP
INTRAMUSCULAR | Status: AC
  Filled 2016-07-30: qty 20

## 2016-07-30 MED ORDER — ONDANSETRON HCL 4 MG/2ML IJ SOLN
4.0000 mg | Freq: Once | INTRAMUSCULAR | Status: AC
  Administered 2016-07-30: 4 mg via INTRAVENOUS

## 2016-07-30 MED ORDER — LIDOCAINE HCL (CARDIAC) 10 MG/ML IV SOLN
INTRAVENOUS | Status: DC | PRN
  Administered 2016-07-30: 40 mg via INTRAVENOUS

## 2016-07-30 MED ORDER — GLYCOPYRROLATE 0.2 MG/ML IJ SOLN
INTRAMUSCULAR | Status: AC
  Filled 2016-07-30: qty 2

## 2016-07-30 MED ORDER — LIDOCAINE HCL (PF) 1 % IJ SOLN
INTRAMUSCULAR | Status: AC
  Filled 2016-07-30: qty 5

## 2016-07-30 MED ORDER — PROMETHAZINE HCL 25 MG/ML IJ SOLN
INTRAMUSCULAR | Status: AC
  Filled 2016-07-30: qty 1

## 2016-07-30 MED ORDER — PROMETHAZINE HCL 25 MG/ML IJ SOLN
6.2500 mg | Freq: Once | INTRAMUSCULAR | Status: AC
  Administered 2016-07-30: 6.25 mg via INTRAVENOUS

## 2016-07-30 MED ORDER — OXYCODONE-ACETAMINOPHEN 5-325 MG PO TABS
ORAL_TABLET | ORAL | Status: AC
  Filled 2016-07-30: qty 1

## 2016-07-30 MED ORDER — MIDAZOLAM HCL 2 MG/2ML IJ SOLN
INTRAMUSCULAR | Status: AC
  Filled 2016-07-30: qty 2

## 2016-07-30 MED ORDER — MIDAZOLAM HCL 2 MG/2ML IJ SOLN
1.0000 mg | INTRAMUSCULAR | Status: AC
  Administered 2016-07-30 (×2): 2 mg via INTRAVENOUS

## 2016-07-30 MED ORDER — DEXTROSE 50 % IV SOLN: 25.0000 mL | Freq: Once | INTRAVENOUS | Status: DC

## 2016-07-30 MED ORDER — SUCCINYLCHOLINE CHLORIDE 20 MG/ML IJ SOLN
INTRAMUSCULAR | Status: AC
  Filled 2016-07-30: qty 1

## 2016-07-30 MED ORDER — LACTATED RINGERS IV SOLN
INTRAVENOUS | Status: DC
  Administered 2016-07-30: 07:00:00 via INTRAVENOUS

## 2016-07-30 MED ORDER — CHLORHEXIDINE GLUCONATE 4 % EX LIQD: 60.0000 mL | Freq: Once | CUTANEOUS | Status: DC

## 2016-07-30 MED ORDER — HYDROCODONE-ACETAMINOPHEN 5-325 MG PO TABS: 1.0000 | ORAL_TABLET | Freq: Four times a day (QID) | ORAL | 0 refills | Status: DC | PRN

## 2016-07-30 MED ORDER — NEOSTIGMINE METHYLSULFATE 10 MG/10ML IV SOLN
INTRAVENOUS | Status: DC | PRN
  Administered 2016-07-30: 2.5 mg via INTRAVENOUS

## 2016-07-30 MED ORDER — EPHEDRINE SULFATE 50 MG/ML IJ SOLN
INTRAMUSCULAR | Status: DC | PRN
  Administered 2016-07-30: 5 mg via INTRAVENOUS

## 2016-07-30 MED ORDER — FENTANYL CITRATE (PF) 100 MCG/2ML IJ SOLN
INTRAMUSCULAR | Status: DC | PRN
  Administered 2016-07-30 (×2): 50 ug via INTRAVENOUS

## 2016-07-30 MED ORDER — SODIUM CHLORIDE 0.9% FLUSH
INTRAVENOUS | Status: AC
  Filled 2016-07-30: qty 10

## 2016-07-30 MED ORDER — ROCURONIUM 10MG/ML (10ML) SYRINGE FOR MEDFUSION PUMP - OPTIME
INTRAVENOUS | Status: DC | PRN
  Administered 2016-07-30: 25 mg via INTRAVENOUS

## 2016-07-30 MED ORDER — 0.9 % SODIUM CHLORIDE (POUR BTL) OPTIME
TOPICAL | Status: DC | PRN
  Administered 2016-07-30: 1000 mL

## 2016-07-30 MED ORDER — PROPOFOL 10 MG/ML IV BOLUS
INTRAVENOUS | Status: AC
  Filled 2016-07-30: qty 20

## 2016-07-30 MED ORDER — POVIDONE-IODINE 10 % OINT PACKET
TOPICAL_OINTMENT | CUTANEOUS | Status: DC | PRN
  Administered 2016-07-30: 1 via TOPICAL

## 2016-07-30 MED ORDER — FENTANYL CITRATE (PF) 250 MCG/5ML IJ SOLN
INTRAMUSCULAR | Status: AC
  Filled 2016-07-30: qty 5

## 2016-07-30 MED ORDER — FENTANYL CITRATE (PF) 100 MCG/2ML IJ SOLN
25.0000 ug | INTRAMUSCULAR | Status: DC | PRN
  Administered 2016-07-30: 50 ug via INTRAVENOUS
  Administered 2016-07-30: 25 ug via INTRAVENOUS
  Filled 2016-07-30: qty 2

## 2016-07-30 MED ORDER — PROPOFOL 10 MG/ML IV BOLUS
INTRAVENOUS | Status: DC | PRN
  Administered 2016-07-30: 100 mg via INTRAVENOUS

## 2016-07-30 MED ORDER — GLYCOPYRROLATE 0.2 MG/ML IJ SOLN
INTRAMUSCULAR | Status: DC | PRN
  Administered 2016-07-30: .5 mg via INTRAVENOUS

## 2016-07-30 SURGICAL SUPPLY — 45 items
BAG HAMPER (MISCELLANEOUS) ×2 IMPLANT
BLADE SURG SZ11 CARB STEEL (BLADE) ×2 IMPLANT
CHLORAPREP W/TINT 26ML (MISCELLANEOUS) ×2 IMPLANT
CLOTH BEACON ORANGE TIMEOUT ST (SAFETY) ×2 IMPLANT
COVER LIGHT HANDLE STERIS (MISCELLANEOUS) ×4 IMPLANT
DECANTER SPIKE VIAL GLASS SM (MISCELLANEOUS) ×1 IMPLANT
DRSG TEGADERM 4X4.75 (GAUZE/BANDAGES/DRESSINGS) ×1 IMPLANT
ELECT REM PT RETURN 9FT ADLT (ELECTROSURGICAL) ×2
ELECTRODE REM PT RTRN 9FT ADLT (ELECTROSURGICAL) ×1 IMPLANT
GAUZE SPONGE 4X4 12PLY STRL (GAUZE/BANDAGES/DRESSINGS) ×2 IMPLANT
GLOVE BIOGEL PI IND STRL 7.0 (GLOVE) ×1 IMPLANT
GLOVE BIOGEL PI INDICATOR 7.0 (GLOVE) ×1
GLOVE SURG SS PI 7.5 STRL IVOR (GLOVE) ×2 IMPLANT
GOWN STRL REUS W/ TWL LRG LVL3 (GOWN DISPOSABLE) ×1 IMPLANT
GOWN STRL REUS W/ TWL XL LVL3 (GOWN DISPOSABLE) ×1 IMPLANT
GOWN STRL REUS W/TWL LRG LVL3 (GOWN DISPOSABLE) ×2
GOWN STRL REUS W/TWL XL LVL3 (GOWN DISPOSABLE) ×2
INST SET MAJOR GENERAL (KITS) IMPLANT
INST SET MINOR GENERAL (KITS) ×1 IMPLANT
KIT ROOM TURNOVER APOR (KITS) ×2 IMPLANT
MANIFOLD NEPTUNE II (INSTRUMENTS) ×2 IMPLANT
MESH VENTRALEX ST 1-7/10 CRC S (Mesh General) ×1 IMPLANT
NDL HYPO 21X1.5 SAFETY (NEEDLE) ×1 IMPLANT
NEEDLE HYPO 21X1.5 SAFETY (NEEDLE) ×2 IMPLANT
NS IRRIG 1000ML POUR BTL (IV SOLUTION) ×2 IMPLANT
PACK ABDOMINAL MAJOR (CUSTOM PROCEDURE TRAY) IMPLANT
PACK MINOR (CUSTOM PROCEDURE TRAY) ×1 IMPLANT
PAD ARMBOARD 7.5X6 YLW CONV (MISCELLANEOUS) ×2 IMPLANT
SET BASIN LINEN APH (SET/KITS/TRAYS/PACK) ×2 IMPLANT
SPONGE GAUZE 2X2 NONSTERILE (GAUZE/BANDAGES/DRESSINGS) ×1 IMPLANT
STAPLER VISISTAT (STAPLE) ×1 IMPLANT
SUT ETHIBOND 0 MO6 C/R (SUTURE) ×1 IMPLANT
SUT NOVA NAB GS-21 1 T12 (SUTURE) IMPLANT
SUT NOVA NAB GS-22 2 2-0 T-19 (SUTURE) IMPLANT
SUT NOVA NAB GS-26 0 60 (SUTURE) IMPLANT
SUT PROLENE 0 CT 1 CR/8 (SUTURE) IMPLANT
SUT SILK 2 0 (SUTURE)
SUT SILK 2-0 18XBRD TIE 12 (SUTURE) IMPLANT
SUT VIC AB 2-0 CT1 27 (SUTURE) ×2
SUT VIC AB 2-0 CT1 TAPERPNT 27 (SUTURE) ×1 IMPLANT
SUT VIC AB 3-0 SH 27 (SUTURE) ×2
SUT VIC AB 3-0 SH 27X BRD (SUTURE) ×1 IMPLANT
SUT VIC AB 4-0 PS2 27 (SUTURE) IMPLANT
SUT VICRYL AB 2 0 TIES (SUTURE) IMPLANT
SYR 20CC LL (SYRINGE) ×2 IMPLANT

## 2016-07-30 NOTE — Addendum Note (Signed)
Addendum  created 07/30/16 1022 by Moshe Salisbury, CRNA   Charge Capture section accepted, Sign clinical note

## 2016-07-30 NOTE — Anesthesia Procedure Notes (Signed)
Procedure Name: Intubation Date/Time: 07/30/2016 7:52 AM Performed by: Tressie Stalker E Pre-anesthesia Checklist: Patient identified, Patient being monitored, Timeout performed, Emergency Drugs available and Suction available Patient Re-evaluated:Patient Re-evaluated prior to inductionOxygen Delivery Method: Circle system utilized Preoxygenation: Pre-oxygenation with 100% oxygen Intubation Type: IV induction Ventilation: Mask ventilation without difficulty Laryngoscope Size: Mac and 3 Grade View: Grade II Tube type: Oral Tube size: 7.0 mm Number of attempts: 1 Airway Equipment and Method: Stylet Placement Confirmation: ETT inserted through vocal cords under direct vision,  positive ETCO2 and breath sounds checked- equal and bilateral Secured at: 21 cm Tube secured with: Tape Dental Injury: Teeth and Oropharynx as per pre-operative assessment

## 2016-07-30 NOTE — Anesthesia Preprocedure Evaluation (Addendum)
Anesthesia Evaluation  Patient identified by MRN, date of birth, ID band Patient awake    Reviewed: Allergy & Precautions, NPO status , Patient's Chart, lab work & pertinent test results  Airway Mallampati: II  TM Distance: >3 FB Neck ROM: Full    Dental  (+) Edentulous Upper, Edentulous Lower   Pulmonary shortness of breath and with exertion, asthma , sleep apnea , COPD, former smoker,    breath sounds clear to auscultation       Cardiovascular negative cardio ROS   Rhythm:Regular Rate:Normal     Neuro/Psych  Headaches, PSYCHIATRIC DISORDERS Anxiety    GI/Hepatic GERD  ,  Endo/Other  Hypothyroidism   Renal/GU      Musculoskeletal   Abdominal   Peds  Hematology   Anesthesia Other Findings   Reproductive/Obstetrics                            Anesthesia Physical Anesthesia Plan  ASA: III  Anesthesia Plan: General   Post-op Pain Management:    Induction: Intravenous  Airway Management Planned: Oral ETT  Additional Equipment:   Intra-op Plan:   Post-operative Plan: Extubation in OR  Informed Consent: I have reviewed the patients History and Physical, chart, labs and discussed the procedure including the risks, benefits and alternatives for the proposed anesthesia with the patient or authorized representative who has indicated his/her understanding and acceptance.     Plan Discussed with:   Anesthesia Plan Comments:       Anesthesia Quick Evaluation

## 2016-07-30 NOTE — Anesthesia Postprocedure Evaluation (Signed)
Anesthesia Post Note  Patient: [name]  Procedure(s) Performed: Procedure(s) (LRB): HERNIA REPAIR INCISIONAL WITH MESH (N/A)  Patient location during evaluation: PACU Anesthesia Type: General Level of consciousness: awake and alert Pain management: satisfactory to patient Vital Signs Assessment: post-procedure vital signs reviewed and stable Respiratory status: spontaneous breathing Cardiovascular status: stable Postop Assessment: no signs of nausea or vomiting Anesthetic complications: no Comments: Nausea resolved with Medications in PACU. See MAR.     Last Vitals:  Vitals:   07/30/16 0900 07/30/16 0915  BP: 110/73 105/75  Pulse: 80 74  Resp: (!) 29 (!) 8  Temp:      Last Pain:  Vitals:   07/30/16 0915  TempSrc:   PainSc: 6                  Lerline Valdivia

## 2016-07-30 NOTE — Discharge Instructions (Signed)
Open Hernia Repair, Adult, Care After °This sheet gives you information about how to care for yourself after your procedure. Your health care provider may also give you more specific instructions. If you have problems or questions, contact your health care provider. °What can I expect after the procedure? °After the procedure, it is common to have: °· Mild discomfort. °· Slight bruising. °· Minor swelling. °· Pain in the abdomen. ° °Follow these instructions at home: °Incision care ° °· Follow instructions from your health care provider about how to take care of your incision area. Make sure you: °? Wash your hands with soap and water before you change your bandage (dressing). If soap and water are not available, use hand sanitizer. °? Change your dressing as told by your health care provider. °? Leave stitches (sutures), skin glue, or adhesive strips in place. These skin closures may need to stay in place for 2 weeks or longer. If adhesive strip edges start to loosen and curl up, you may trim the loose edges. Do not remove adhesive strips completely unless your health care provider tells you to do that. °· Check your incision area every day for signs of infection. Check for: °? More redness, swelling, or pain. °? More fluid or blood. °? Warmth. °? Pus or a bad smell. °Activity °· Do not drive or use heavy machinery while taking prescription pain medicine. Do not drive until your health care provider approves. °· Until your health care provider approves: °? Do not lift anything that is heavier than 10 lb (4.5 kg). °? Do not play contact sports. °· Return to your normal activities as told by your health care provider. Ask your health care provider what activities are safe. °General instructions °· To prevent or treat constipation while you are taking prescription pain medicine, your health care provider may recommend that you: °? Drink enough fluid to keep your urine clear or pale yellow. °? Take over-the-counter or  prescription medicines. °? Eat foods that are high in fiber, such as fresh fruits and vegetables, whole grains, and beans. °? Limit foods that are high in fat and processed sugars, such as fried and sweet foods. °· Take over-the-counter and prescription medicines only as told by your health care provider. °· Do not take tub baths or go swimming until your health care provider approves. °· Keep all follow-up visits as told by your health care provider. This is important. °Contact a health care provider if: °· You develop a rash. °· You have more redness, swelling, or pain around your incision. °· You have more fluid or blood coming from your incision. °· Your incision feels warm to the touch. °· You have pus or a bad smell coming from your incision. °· You have a fever or chills. °· You have blood in your stool (feces). °· You have not had a bowel movement in 2-3 days. °· Your pain is not controlled with medicine. °Get help right away if: °· You have chest pain or shortness of breath. °· You feel light-headed or feel faint. °· You have severe pain. °· You vomit and your pain is worse. °This information is not intended to replace advice given to you by your health care provider. Make sure you discuss any questions you have with your health care provider. °Document Released: 10/13/2004 Document Revised: 10/14/2015 Document Reviewed: 09/07/2015 °Elsevier Interactive Patient Education © 2017 [employer] ° °PATIENT INSTRUCTIONS °POST-ANESTHESIA ° °IMMEDIATELY FOLLOWING SURGERY:  Do not drive or operate machinery for the   first twenty four hours after surgery.  Do not make any important decisions for twenty four hours after surgery or while taking narcotic pain medications or sedatives.  If you develop intractable nausea and vomiting or a severe headache please notify your doctor immediately. ° °FOLLOW-UP:  Please make an appointment with your surgeon as instructed. You do not need to follow up with anesthesia unless  specifically instructed to do so. ° °WOUND CARE INSTRUCTIONS (if applicable):  Keep a dry clean dressing on the anesthesia/puncture wound site if there is drainage.  Once the wound has quit draining you may leave it open to air.  Generally you should leave the bandage intact for twenty four hours unless there is drainage.  If the epidural site drains for more than 36-48 hours please call the anesthesia department. ° °QUESTIONS?:  Please feel free to call your physician or the hospital operator if you have any questions, and they will be happy to assist you.    ° ° ° ° °

## 2016-07-30 NOTE — Op Note (Signed)
Patient:  [name]  DOB:  December 04, 1965  MRN:  [id number]   Preop Diagnosis:  Incisional hernia  Postop Diagnosis:  Same  Procedure:  Incisional herniorrhaphy with mesh  Surgeon:  [name], M.D.  Anes:  Gen. endotracheal  Indications:  Patient is a 51 year old white female who presents with an incisional hernia at and epigastric trocar site. The risks and benefits of the procedure including bleeding, infection, mesh use, and the possibility of recurrence of the hernia were fully explained to the patient, who gave informed consent.  Procedure note:  The patient was placed in the supine position. After induction of general endotracheal anesthesia, the abdomen was prepped and draped using the usual sterile technique with DuraPrep. Surgical site confirmation was performed.  A transverse epigastric scar was used and slightly extended. The dissection was taken down to the fascia. The patient had a 1 cm hernia defect in the fascia. The surrounding fascia was palpated and no other hernia defects were noted. The adipose tissue was reduced and a 4.3 cm Bard ventralax ST patch was inserted and secured to the fascia using 0 Ethibond interrupted sutures. The overlying fascia was reapproximated transversely using 0 Ethibond interrupted sutures. The subcutaneous layer was reapproximated using 3-0 Vicryl interrupted suture. Exparel was instilled into the surrounding wound. The skin was closed using staples. Betadine ointment and dressed with dressings were applied.  All tape and needle counts were correct at the end of the procedure. The patient was extubated in the operating room and transferred to PACU in stable condition.  Complications:  None  EBL:  Minimal  Specimen:  None

## 2016-07-30 NOTE — Addendum Note (Signed)
Addendum  created 07/30/16 1007 by Moshe Salisbury, CRNA   Sign clinical note

## 2016-07-30 NOTE — Transfer of Care (Signed)
Immediate Anesthesia Transfer of Care Note  Patient: [name]  Procedure(s) Performed: Procedure(s): HERNIA REPAIR INCISIONAL WITH MESH (N/A)  Patient Location: PACU  Anesthesia Type:General  Level of Consciousness: awake  Airway & Oxygen Therapy: Patient Spontanous Breathing and Patient connected to face mask oxygen  Post-op Assessment: Report given to RN  Post vital signs: Reviewed and stable  Last Vitals:  Vitals:   07/30/16 0720 07/30/16 0832  Pulse: 82   Resp: 18   Temp: 36.9 C (P) 36.5 C    Last Pain:  Vitals:   07/30/16 0720  TempSrc: Oral  PainSc: 6       Patients Stated Pain Goal: 4 (07/30/16 0720)  Complications: No apparent anesthesia complications

## 2016-07-30 NOTE — Interval H&P Note (Signed)
History and Physical Interval Note:  07/30/2016 7:08 AM  [name]  has presented today for surgery, with the diagnosis of incisional hernia  The various methods of treatment have been discussed with the patient and family. After consideration of risks, benefits and other options for treatment, the patient has consented to  Procedure(s): HERNIA REPAIR INCISIONAL WITH MESH (N/A) as a surgical intervention .  The patient's history has been reviewed, patient examined, no change in status, stable for surgery.  I have reviewed the patient's chart and labs.  Questions were answered to the patient's satisfaction.     [name]

## 2016-08-01 ENCOUNTER — Encounter (HOSPITAL_COMMUNITY): Payer: Self-pay | Admitting: General Surgery

## 2016-08-07 ENCOUNTER — Ambulatory Visit: Payer: 59 | Admitting: General Surgery

## 2016-08-08 ENCOUNTER — Ambulatory Visit (INDEPENDENT_AMBULATORY_CARE_PROVIDER_SITE_OTHER): Payer: Self-pay | Admitting: General Surgery

## 2016-08-08 ENCOUNTER — Encounter: Payer: Self-pay | Admitting: General Surgery

## 2016-08-08 VITALS — BP 110/74 | HR 85 | Temp 99.3°F | Resp 18 | Ht 65.0 in | Wt 134.0 lb

## 2016-08-08 DIAGNOSIS — Z09 Encounter for follow-up examination after completed treatment for conditions other than malignant neoplasm: Secondary | ICD-10-CM

## 2016-08-08 NOTE — Progress Notes (Signed)
Subjective:     [name]  Status post incisional herniorrhaphy with mesh. Doing well. Minimal incisional pain noted. Objective:    BP 110/74   Pulse 85   Temp 99.3 F (37.4 C)   Resp 18   Ht  (1.651 m)   Wt 134 lb (60.8 kg)   BMI 22.30 kg/m   General:  alert, cooperative and no distress  Abdomen is soft. Incision healing well. Staples removed, Steri-Strips applied.     Assessment:    Doing well postoperatively.    Plan:   Increase activity as able. Follow-up as needed.

## 2016-08-14 ENCOUNTER — Encounter: Payer: Self-pay | Admitting: Orthopaedic Surgery

## 2016-08-14 ENCOUNTER — Ambulatory Visit (INDEPENDENT_AMBULATORY_CARE_PROVIDER_SITE_OTHER): Payer: 59 | Admitting: Orthopaedic Surgery

## 2016-08-14 VITALS — BP 105/64 | HR 72 | Temp 97.0°F | Ht 65.0 in | Wt 133.0 lb

## 2016-08-14 DIAGNOSIS — M25511 Pain in right shoulder: Secondary | ICD-10-CM | POA: Diagnosis not present

## 2016-08-14 DIAGNOSIS — M75111 Incomplete rotator cuff tear or rupture of right shoulder, not specified as traumatic: Secondary | ICD-10-CM | POA: Diagnosis not present

## 2016-08-14 DIAGNOSIS — M25411 Effusion, right shoulder: Secondary | ICD-10-CM

## 2016-08-14 NOTE — Patient Instructions (Signed)
Out of Work, Continue PT

## 2016-08-14 NOTE — Progress Notes (Signed)
Patient [name]:Detria Aline BrochureW Brzozowski, female DOB:04/12/1965, 51 y.o. JWJ:[id number]  Chief Complaint  Patient presents with  . Follow-up    right shoulder    HPI  [name] is a 51 y.o. female who has right shoulder pain from incomplete rotator cuff tear.  She is going to PT and is making steady but slow progress. It is not as painful as it was.  She still has lack of full motion.  I will keep her out of work another two weeks and continue PT. HPI  Body mass index is 22.13 kg/m.  ROS  Review of Systems  HENT: Negative for congestion.   Respiratory: Positive for shortness of breath. Negative for cough.   Cardiovascular: Negative for chest pain and leg swelling.  Endocrine: Positive for cold intolerance.  Musculoskeletal: Positive for arthralgias and joint swelling.  Allergic/Immunologic: Positive for environmental allergies.  Neurological: Positive for headaches.  Psychiatric/Behavioral: The patient is nervous/anxious.     Past Medical History:  Diagnosis Date  . Anxiety   . Asthma   . Cluster headaches   . COPD (chronic obstructive pulmonary disease) (HCC)   . COPD (chronic obstructive pulmonary disease) with chronic bronchitis (HCC)   . GERD (gastroesophageal reflux disease)   . Hyperlipemia   . Hypoglycemia   . Hypothyroidism   . Migraine   . Pneumothorax   . Rectal discharge 07/28/2010  . Shortness of breath   . Sleep apnea    cannot tolerate-not using CPAP  . Sluggishness 07/28/2010    Past Surgical History:  Procedure Laterality Date  . ABDOMINAL HYSTERECTOMY     with bladder suspension  . CHOLECYSTECTOMY    . COLONOSCOPY N/A 07/30/2013   Procedure: COLONOSCOPY;  Surgeon: Malissa HippoNajeeb U Rehman, MD;  Location: [facility];  Service: Endoscopy;  Laterality: N/A;  930  . INCISIONAL HERNIA REPAIR N/A 07/30/2016   Procedure: HERNIA REPAIR INCISIONAL WITH MESH;  Surgeon: Franky MachoMark Jenkins, MD;  Location: [facility];  Service: General;  Laterality: N/A;  . LOBECTOMY     right lung   . TUBAL LIGATION      Family History  Problem Relation Age of Onset  . COPD Mother   . Diabetes Mother   . Hyperlipidemia Mother   . Hypertension Mother   . Bipolar disorder Son   . Heart failure Maternal Grandmother   . Hypertension Maternal Grandmother   . Thyroid disease Maternal Grandmother   . Diabetes Paternal Grandmother   . Alzheimer's disease Paternal Grandmother   . Heart failure Paternal Grandfather   . Hypertension Paternal Grandfather   . Hypertension Father   . Colon cancer Neg Hx     Social History Social History  Substance Use Topics  . Smoking status: Former Smoker    Packs/day: 1.00    Years: 30.00    Types: Cigarettes    Quit date: 02/21/2008  . Smokeless tobacco: Never Used  . Alcohol use No     Comment: no    Allergies  Allergen Reactions  . Aspirin     Causes asthma flares   . Penicillins Other (See Comments)    Childhood Reaction Has patient had a PCN reaction causing immediate rash, facial/tongue/throat swelling, SOB or lightheadedness with hypotension: NO Has patient had a PCN reaction causing severe rash involving mucus membranes or skin necrosis: NO Has patient had a PCN reaction that required hospitalization: no Has patient had a PCN reaction occurring within the last 10 years: NO If all of the above answers are "  NO", then may proceed with Cephalosporin use.     Current Outpatient Prescriptions  Medication Sig Dispense Refill  . acetaminophen (TYLENOL) 500 MG tablet Take 1,000 mg by mouth every 6 (six) hours as needed for moderate pain.    Marland Kitchen albuterol (VENTOLIN HFA) 108 (90 BASE) MCG/ACT inhaler Inhale 2 puffs into the lungs every 4 (four) hours as needed for shortness of breath.     Marland Kitchen aspirin-acetaminophen-caffeine (EXCEDRIN MIGRAINE) 250-250-65 MG tablet Take 2 tablets by mouth every 6 (six) hours as needed for headache or migraine.    . Calcium Carbonate-Vitamin D (CALCIUM 600+D PO) Take 1 tablet by mouth 2 (two) times  daily.    . cetirizine (ZYRTEC) 10 MG tablet Take 10 mg by mouth daily.    Marland Kitchen HYDROcodone-acetaminophen (NORCO) 5-325 MG tablet Take 1-2 tablets by mouth every 6 (six) hours as needed for moderate pain. 40 tablet 0  . levothyroxine (SYNTHROID, LEVOTHROID) 75 MCG tablet Take 75 mcg by mouth daily before breakfast.    . LORazepam (ATIVAN) 0.5 MG tablet Take 1 tablet (0.5 mg total) by mouth at bedtime as needed for anxiety. 30 tablet 1  . mometasone-formoterol (DULERA) 200-5 MCG/ACT AERO Inhale 1 puff into the lungs 2 (two) times daily.    . pantoprazole (PROTONIX) 40 MG tablet Take 40 mg by mouth daily.    Bertram Gala Glycol-Propyl Glycol (SYSTANE ULTRA OP) Apply 1 drop to eye daily as needed (dry eyes).    . SUMAtriptan 6 MG/0.5ML SOAJ Inject 6 mg as directed daily as needed (migraine).     . Triamcinolone Acetonide (NASACORT ALLERGY 24HR NA) Place 1 spray into the nose daily as needed (allergies).    . valACYclovir (VALTREX) 500 MG tablet Take 2000mg s at first sign of fever blister outbreak then take 500mg s daily until gone     No current facility-administered medications for this visit.      Physical Exam  Blood pressure 105/64, pulse 72, temperature 97 F (36.1 C), height 5\' 5"  (1.651 m), weight 133 lb (60.3 kg).  Constitutional: overall normal hygiene, normal nutrition, well developed, normal grooming, normal body habitus. Assistive device:none  Musculoskeletal: gait and station Limp none, muscle tone and strength are normal, no tremors or atrophy is present.  .  Neurological: coordination overall normal.  Deep tendon reflex/nerve stretch intact.  Sensation normal.  Cranial nerves II-XII intact.   Skin:   Normal overall no scars, lesions, ulcers or rashes. No psoriasis.  Psychiatric: Alert and oriented x 3.  Recent memory intact, remote memory unclear.  Normal mood and affect. Well groomed.  Good eye contact.  Cardiovascular: overall no swelling, no varicosities, no edema  bilaterally, normal temperatures of the legs and arms, no clubbing, cyanosis and good capillary refill.  Lymphatic: palpation is normal.  Examination of right Upper Extremity is done.  Inspection:   Overall:  Elbow non-tender without crepitus or defects, forearm non-tender without crepitus or defects, wrist non-tender without crepitus or defects, hand non-tender.    Shoulder: with glenohumeral joint tenderness, without effusion.   Upper arm: without swelling and tenderness   Range of motion:   Overall:  Full range of motion of the elbow, full range of motion of wrist and full range of motion in fingers.   Shoulder:  right  160 degrees forward flexion; 120 degrees abduction; 35 degrees internal rotation, 35 degrees external rotation, 15 degrees extension, 40 degrees adduction.   Stability:   Overall:  Shoulder, elbow and wrist stable   Strength and  Tone:   Overall full shoulder muscles strength, full upper arm strength and normal upper arm bulk and tone.   The patient has been educated about the nature of the problem(s) and counseled on treatment options.  The patient appeared to understand what I have discussed and is in agreement with it.  Encounter Diagnoses  Name Primary?  . Incomplete tear of right rotator cuff Yes  . Pain and swelling of right shoulder     PLAN Call if any problems.  Precautions discussed.  Continue current medications.   Return to clinic 2 weeks   Continue PT.  Electronically Signed Darreld Mclean, MD 5/8/20189:02 AM

## 2016-08-28 ENCOUNTER — Ambulatory Visit (INDEPENDENT_AMBULATORY_CARE_PROVIDER_SITE_OTHER): Payer: 59 | Admitting: Orthopaedic Surgery

## 2016-08-28 ENCOUNTER — Encounter: Payer: Self-pay | Admitting: Orthopaedic Surgery

## 2016-08-28 VITALS — BP 119/82 | HR 101 | Temp 97.5°F | Ht 65.0 in | Wt 133.0 lb

## 2016-08-28 DIAGNOSIS — M75111 Incomplete rotator cuff tear or rupture of right shoulder, not specified as traumatic: Secondary | ICD-10-CM | POA: Diagnosis not present

## 2016-08-28 NOTE — Progress Notes (Signed)
Patient ZO:XWRUEAVWU:[name], female DOB:05/25/1965, 51 y.o. JWJ:[id number]  Chief Complaint  Patient presents with  . Follow-up    Right Shoulder, s/p PT    HPI  Melissa HaringCatherina W Zonia Watson is a 51 y.o. female who has pain of the right shoulder and incomplete rotator cuff tear.  She has been going to PT/OT and is making slow but steady progress.  She would like to continue therapy another month. I agree.  I will keep her out of work. HPI  Body mass index is 22.13 kg/m.  ROS  Review of Systems  HENT: Negative for congestion.   Respiratory: Positive for shortness of breath. Negative for cough.   Cardiovascular: Negative for chest pain and leg swelling.  Endocrine: Positive for cold intolerance.  Musculoskeletal: Positive for arthralgias and joint swelling.  Allergic/Immunologic: Positive for environmental allergies.  Neurological: Positive for headaches.  Psychiatric/Behavioral: The patient is nervous/anxious.     Past Medical History:  Diagnosis Date  . Anxiety   . Asthma   . Cluster headaches   . COPD (chronic obstructive pulmonary disease) (HCC)   . COPD (chronic obstructive pulmonary disease) with chronic bronchitis (HCC)   . GERD (gastroesophageal reflux disease)   . Hyperlipemia   . Hypoglycemia   . Hypothyroidism   . Migraine   . Pneumothorax   . Rectal discharge 07/28/2010  . Shortness of breath   . Sleep apnea    cannot tolerate-not using CPAP  . Sluggishness 07/28/2010    Past Surgical History:  Procedure Laterality Date  . ABDOMINAL HYSTERECTOMY     with bladder suspension  . CHOLECYSTECTOMY    . COLONOSCOPY N/A 07/30/2013   Procedure: COLONOSCOPY;  Surgeon: Malissa HippoNajeeb U Rehman, MD;  Location: [facility];  Service: Endoscopy;  Laterality: N/A;  930  . INCISIONAL HERNIA REPAIR N/A 07/30/2016   Procedure: HERNIA REPAIR INCISIONAL WITH MESH;  Surgeon: Franky MachoMark Jenkins, MD;  Location: [facility];  Service: General;  Laterality: N/A;  . LOBECTOMY     right lung   .  TUBAL LIGATION      Family History  Problem Relation Age of Onset  . COPD Mother   . Diabetes Mother   . Hyperlipidemia Mother   . Hypertension Mother   . Bipolar disorder Son   . Heart failure Maternal Grandmother   . Hypertension Maternal Grandmother   . Thyroid disease Maternal Grandmother   . Diabetes Paternal Grandmother   . Alzheimer's disease Paternal Grandmother   . Heart failure Paternal Grandfather   . Hypertension Paternal Grandfather   . Hypertension Father   . Colon cancer Neg Hx     Social History Social History  Substance Use Topics  . Smoking status: Former Smoker    Packs/day: 1.00    Years: 30.00    Types: Cigarettes    Quit date: 02/21/2008  . Smokeless tobacco: Never Used  . Alcohol use No     Comment: no    Allergies  Allergen Reactions  . Aspirin     Causes asthma flares   . Penicillins Other (See Comments)    Childhood Reaction Has patient had a PCN reaction causing immediate rash, facial/tongue/throat swelling, SOB or lightheadedness with hypotension: NO Has patient had a PCN reaction causing severe rash involving mucus membranes or skin necrosis: NO Has patient had a PCN reaction that required hospitalization: no Has patient had a PCN reaction occurring within the last 10 years: NO If all of the above answers are "NO", then may proceed with  Cephalosporin use.     Current Outpatient Prescriptions  Medication Sig Dispense Refill  . acetaminophen (TYLENOL) 500 MG tablet Take 1,000 mg by mouth every 6 (six) hours as needed for moderate pain.    Marland Kitchen albuterol (VENTOLIN HFA) 108 (90 BASE) MCG/ACT inhaler Inhale 2 puffs into the lungs every 4 (four) hours as needed for shortness of breath.     Marland Kitchen aspirin-acetaminophen-caffeine (EXCEDRIN MIGRAINE) 250-250-65 MG tablet Take 2 tablets by mouth every 6 (six) hours as needed for headache or migraine.    . Calcium Carbonate-Vitamin D (CALCIUM 600+D PO) Take 1 tablet by mouth 2 (two) times daily.    .  cetirizine (ZYRTEC) 10 MG tablet Take 10 mg by mouth daily.    Marland Kitchen HYDROcodone-acetaminophen (NORCO) 5-325 MG tablet Take 1-2 tablets by mouth every 6 (six) hours as needed for moderate pain. 40 tablet 0  . levothyroxine (SYNTHROID, LEVOTHROID) 75 MCG tablet Take 75 mcg by mouth daily before breakfast.    . LORazepam (ATIVAN) 0.5 MG tablet Take 1 tablet (0.5 mg total) by mouth at bedtime as needed for anxiety. 30 tablet 1  . mometasone-formoterol (DULERA) 200-5 MCG/ACT AERO Inhale 1 puff into the lungs 2 (two) times daily.    . pantoprazole (PROTONIX) 40 MG tablet Take 40 mg by mouth daily.    Bertram Gala Glycol-Propyl Glycol (SYSTANE ULTRA OP) Apply 1 drop to eye daily as needed (dry eyes).    . SUMAtriptan 6 MG/0.5ML SOAJ Inject 6 mg as directed daily as needed (migraine).     . Triamcinolone Acetonide (NASACORT ALLERGY 24HR NA) Place 1 spray into the nose daily as needed (allergies).    . valACYclovir (VALTREX) 500 MG tablet Take 2000mg s at first sign of fever blister outbreak then take 500mg s daily until gone     No current facility-administered medications for this visit.      Physical Exam  Blood pressure 119/82, pulse (!) 101, temperature 97.5 F (36.4 C), height 5\' 5"  (1.651 m), weight 133 lb (60.3 kg).  Constitutional: overall normal hygiene, normal nutrition, well developed, normal grooming, normal body habitus. Assistive device:none  Musculoskeletal: gait and station Limp none, muscle tone and strength are normal, no tremors or atrophy is present.  .  Neurological: coordination overall normal.  Deep tendon reflex/nerve stretch intact.  Sensation normal.  Cranial nerves II-XII intact.   Skin:   Normal overall no scars, lesions, ulcers or rashes. No psoriasis.  Psychiatric: Alert and oriented x 3.  Recent memory intact, remote memory unclear.  Normal mood and affect. Well groomed.  Good eye contact.  Cardiovascular: overall no swelling, no varicosities, no edema bilaterally,  normal temperatures of the legs and arms, no clubbing, cyanosis and good capillary refill.  Lymphatic: palpation is normal.  Examination of right Upper Extremity is done.  Inspection:   Overall:  Elbow non-tender without crepitus or defects, forearm non-tender without crepitus or defects, wrist non-tender without crepitus or defects, hand non-tender.    Shoulder: with glenohumeral joint tenderness, without effusion.   Upper arm: without swelling and tenderness   Range of motion:   Overall:  Full range of motion of the elbow, full range of motion of wrist and full range of motion in fingers.   Shoulder:  right  140 degrees forward flexion; 120 degrees abduction; 35 degrees internal rotation, 35 degrees external rotation, 15 degrees extension, 40 degrees adduction.   Stability:   Overall:  Shoulder, elbow and wrist stable   Strength and Tone:   Overall  full shoulder muscles strength, full upper arm strength and normal upper arm bulk and tone.   The patient has been educated about the nature of the problem(s) and counseled on treatment options.  The patient appeared to understand what I have discussed and is in agreement with it.  Encounter Diagnosis  Name Primary?  . Incomplete tear of right rotator cuff Yes    PLAN Call if any problems.  Precautions discussed.  Continue current medications.   Return to clinic 1 month   Out of work  Electronically Signed Darreld Mclean, MD 5/22/20189:57 AM

## 2016-08-28 NOTE — Patient Instructions (Signed)
Out of work. Continue PT 

## 2016-09-10 ENCOUNTER — Telehealth: Payer: Self-pay | Admitting: Orthopaedic Surgery

## 2016-09-10 NOTE — Telephone Encounter (Signed)
Patient would like to know if you thought she would benefit from an anti-inflammatory?  If so, would you call something in for her?

## 2016-09-10 NOTE — Telephone Encounter (Signed)
She is on anti-acid medicine.  NSAID not recommended.

## 2016-09-11 ENCOUNTER — Telehealth: Payer: Self-pay | Admitting: Orthopaedic Surgery

## 2016-09-11 NOTE — Telephone Encounter (Signed)
Called patient, notified. Does wish to have the prednisone prescribed. Submitted request separately to Dr Hilda LiasKeeling as noted.

## 2016-09-11 NOTE — Telephone Encounter (Signed)
Patient states yes, she would like to have the prescription for Prednisone; she uses [facility], [place].

## 2016-09-11 NOTE — Telephone Encounter (Signed)
I can call in prednisone if she would like.  Keep regular appointment but if she desires, I can see her earlier.  Let me know about the prednisone dose pack, submit separately.

## 2016-09-11 NOTE — Telephone Encounter (Signed)
Relayed to patient per Dr Sanjuan DameKeeling's response. Patient states her shoulder started hurting, with continued therapy, which she is having at [facility] [place]. States therapist is aware, and her next visit there is tomorrow, 09/12/16. Please advise if we need to schedule her follow up sooner than 09/26/16, or any other recommendations?

## 2016-09-12 MED ORDER — PREDNISONE 5 MG (21) PO TBPK: ORAL_TABLET | ORAL | 0 refills | Status: DC

## 2016-09-26 ENCOUNTER — Encounter: Payer: Self-pay | Admitting: Orthopaedic Surgery

## 2016-09-26 ENCOUNTER — Ambulatory Visit (INDEPENDENT_AMBULATORY_CARE_PROVIDER_SITE_OTHER): Payer: 59 | Admitting: Orthopaedic Surgery

## 2016-09-26 VITALS — BP 107/73 | HR 69 | Ht 65.0 in | Wt 134.0 lb

## 2016-09-26 DIAGNOSIS — M25411 Effusion, right shoulder: Secondary | ICD-10-CM | POA: Diagnosis not present

## 2016-09-26 DIAGNOSIS — M25511 Pain in right shoulder: Secondary | ICD-10-CM

## 2016-09-26 DIAGNOSIS — M75111 Incomplete rotator cuff tear or rupture of right shoulder, not specified as traumatic: Secondary | ICD-10-CM | POA: Diagnosis not present

## 2016-09-26 MED ORDER — HYDROCODONE-ACETAMINOPHEN 5-325 MG PO TABS: 1.0000 | ORAL_TABLET | ORAL | 0 refills | Status: DC | PRN

## 2016-09-26 NOTE — Patient Instructions (Signed)
Stop PT for now.  Out of work.

## 2016-09-26 NOTE — Progress Notes (Signed)
Patient AV:[id number] [name], female DOB:June 18, 1965, 51 y.o. NWG:[id number]  Chief Complaint  Patient presents with  . Follow-up    Right shoulder, therapy PT&Hand    HPI  [name] is a 51 y.o. female who has continued pain of her right shoulder.  She has been to PT/OT and it is not helping.  She has had prednisone, injections, NSAIDs and she is not making good progress at all.  I will have her see Dr. Romeo Watson for discussion of possible surgery.  She will remain out of work.   HPI  Body mass index is 22.3 kg/m.  ROS  Review of Systems  HENT: Negative for congestion.   Respiratory: Positive for shortness of breath. Negative for cough.   Cardiovascular: Negative for chest pain and leg swelling.  Endocrine: Positive for cold intolerance.  Musculoskeletal: Positive for arthralgias and joint swelling.  Allergic/Immunologic: Positive for environmental allergies.  Neurological: Positive for headaches.  Psychiatric/Behavioral: The patient is nervous/anxious.     Past Medical History:  Diagnosis Date  . Anxiety   . Asthma   . Cluster headaches   . COPD (chronic obstructive pulmonary disease) (HCC)   . COPD (chronic obstructive pulmonary disease) with chronic bronchitis (HCC)   . GERD (gastroesophageal reflux disease)   . Hyperlipemia   . Hypoglycemia   . Hypothyroidism   . Migraine   . Pneumothorax   . Rectal discharge 07/28/2010  . Shortness of breath   . Sleep apnea    cannot tolerate-not using CPAP  . Sluggishness 07/28/2010    Past Surgical History:  Procedure Laterality Date  . ABDOMINAL HYSTERECTOMY     with bladder suspension  . CHOLECYSTECTOMY    . COLONOSCOPY N/A 07/30/2013   Procedure: COLONOSCOPY;  Surgeon: Melissa Hippo, MD;  Location: [facility];  Service: Endoscopy;  Laterality: N/A;  930  . INCISIONAL HERNIA REPAIR N/A 07/30/2016   Procedure: HERNIA REPAIR INCISIONAL WITH MESH;  Surgeon: Melissa Macho, MD;  Location: [facility];  Service:  General;  Laterality: N/A;  . LOBECTOMY     right lung   . TUBAL LIGATION      Family History  Problem Relation Age of Onset  . COPD Mother   . Diabetes Mother   . Hyperlipidemia Mother   . Hypertension Mother   . Bipolar disorder Son   . Heart failure Maternal Grandmother   . Hypertension Maternal Grandmother   . Thyroid disease Maternal Grandmother   . Diabetes Paternal Grandmother   . Alzheimer's disease Paternal Grandmother   . Heart failure Paternal Grandfather   . Hypertension Paternal Grandfather   . Hypertension Father   . Colon cancer Neg Hx     Social History Social History  Substance Use Topics  . Smoking status: Former Smoker    Packs/day: 1.00    Years: 30.00    Types: Cigarettes    Quit date: 02/21/2008  . Smokeless tobacco: Never Used  . Alcohol use No     Comment: no    Allergies  Allergen Reactions  . Aspirin     Causes asthma flares   . Penicillins Other (See Comments)    Childhood Reaction Has patient had a PCN reaction causing immediate rash, facial/tongue/throat swelling, SOB or lightheadedness with hypotension: NO Has patient had a PCN reaction causing severe rash involving mucus membranes or skin necrosis: NO Has patient had a PCN reaction that required hospitalization: no Has patient had a PCN reaction occurring within the last 10 years: NO  If all of the above answers are "NO", then may proceed with Cephalosporin use.     Current Outpatient Prescriptions  Medication Sig Dispense Refill  . acetaminophen (TYLENOL) 500 MG tablet Take 1,000 mg by mouth every 6 (six) hours as needed for moderate pain.    Marland Kitchen. albuterol (VENTOLIN HFA) 108 (90 BASE) MCG/ACT inhaler Inhale 2 puffs into the lungs every 4 (four) hours as needed for shortness of breath.     Marland Kitchen. aspirin-acetaminophen-caffeine (EXCEDRIN MIGRAINE) 250-250-65 MG tablet Take 2 tablets by mouth every 6 (six) hours as needed for headache or migraine.    . Calcium Carbonate-Vitamin D (CALCIUM  600+D PO) Take 1 tablet by mouth 2 (two) times daily.    . cetirizine (ZYRTEC) 10 MG tablet Take 10 mg by mouth daily.    Marland Kitchen. HYDROcodone-acetaminophen (NORCO/VICODIN) 5-325 MG tablet Take 1 tablet by mouth every 4 (four) hours as needed for moderate pain (Must last 14 days.Do not take and drive a car or use machinery.). 56 tablet 0  . levothyroxine (SYNTHROID, LEVOTHROID) 75 MCG tablet Take 75 mcg by mouth daily before breakfast.    . LORazepam (ATIVAN) 0.5 MG tablet Take 1 tablet (0.5 mg total) by mouth at bedtime as needed for anxiety. 30 tablet 1  . mometasone-formoterol (DULERA) 200-5 MCG/ACT AERO Inhale 1 puff into the lungs 2 (two) times daily.    . pantoprazole (PROTONIX) 40 MG tablet Take 40 mg by mouth daily.    [name]. Polyethyl Glycol-Propyl Glycol (SYSTANE ULTRA OP) Apply 1 drop to eye daily as needed (dry eyes).    . predniSONE (STERAPRED UNI-PAK 21 TAB) 5 MG (21) TBPK tablet Take 6 pills first day; 5 pills second day; 4 pills third day; 3 pills fourth day; 2 pills next day and 1 pill last day. 21 tablet 0  . SUMAtriptan 6 MG/0.5ML SOAJ Inject 6 mg as directed daily as needed (migraine).     . Triamcinolone Acetonide (NASACORT ALLERGY 24HR NA) Place 1 spray into the nose daily as needed (allergies).    . valACYclovir (VALTREX) 500 MG tablet Take 2000mg s at first sign of fever blister outbreak then take 500mg s daily until gone     No current facility-administered medications for this visit.      Physical Exam  Blood pressure 107/73, pulse 69, height 5\' 5"  (1.651 m), weight 134 lb (60.8 kg).  Constitutional: overall normal hygiene, normal nutrition, well developed, normal grooming, normal body habitus. Assistive device:none  Musculoskeletal: gait and station Limp none, muscle tone and strength are normal, no tremors or atrophy is present.  .  Neurological: coordination overall normal.  Deep tendon reflex/nerve stretch intact.  Sensation normal.  Cranial nerves II-XII intact.   Skin:    Normal overall no scars, lesions, ulcers or rashes. No psoriasis.  Psychiatric: Alert and oriented x 3.  Recent memory intact, remote memory unclear.  Normal mood and affect. Well groomed.  Good eye contact.  Cardiovascular: overall no swelling, no varicosities, no edema bilaterally, normal temperatures of the legs and arms, no clubbing, cyanosis and good capillary refill.  Lymphatic: palpation is normal.  Her right shoulder is very painful today and she is not willing to go through a ROM at this time.  She has no effusion or redness.  NV intact. ROM of the neck is full and the left shoulder is negative.  Grips are normal.  The patient has been educated about the nature of the problem(s) and counseled on treatment options.  The patient appeared  to understand what I have discussed and is in agreement with it.  Encounter Diagnoses  Name Primary?  . Incomplete tear of right rotator cuff Yes  . Pain and swelling of right shoulder     PLAN Call if any problems.  Precautions discussed.  Continue current medications.   Return to clinic to see Dr. Romeo Watson for possibility of surgery on the right shoudler as she is failing conservative treatments.   I have reviewed the [place] Controlled Substance Reporting System web site prior to prescribing narcotic medicine for this patient.  Electronically Signed Darreld Mclean, MD 6/20/20189:08 AM

## 2016-10-03 ENCOUNTER — Encounter: Payer: Self-pay | Admitting: Orthopedic Surgery

## 2016-10-03 ENCOUNTER — Ambulatory Visit (INDEPENDENT_AMBULATORY_CARE_PROVIDER_SITE_OTHER): Payer: 59 | Admitting: Orthopedic Surgery

## 2016-10-03 VITALS — BP 112/81 | HR 85 | Ht 66.0 in | Wt 132.0 lb

## 2016-10-03 DIAGNOSIS — M25411 Effusion, right shoulder: Secondary | ICD-10-CM | POA: Diagnosis not present

## 2016-10-03 DIAGNOSIS — M25511 Pain in right shoulder: Secondary | ICD-10-CM

## 2016-10-03 DIAGNOSIS — M75111 Incomplete rotator cuff tear or rupture of right shoulder, not specified as traumatic: Secondary | ICD-10-CM

## 2016-10-03 NOTE — Progress Notes (Signed)
Preop evaluation  Chief complaint right shoulder pain and stiffness  History this is a 51 year old female who is followed for pain in her right shoulder by Dr. Hilda LiasKeeling she was treated with physical therapy, oral NSAIDs including prednisone, subacromial injection and did not improve. She did have an MRI which showed a small rotator cuff tear undersurface supraspinatus approximately 0.3 cm  She reports she is in severe pain has severe loss of motion has weakness in the right shoulder primarily in the anterior and posterior joint line and radiates down the back of her arm in the triceps area but not below the elbow.  She denies any neck pain or history of cervical disc disease  She is employed as a Engineer, productionbaker at [employer]  Review of Systems  Constitutional: Negative for chills, fever and malaise/fatigue.  Respiratory: Positive for shortness of breath.   Cardiovascular: Negative for chest pain.  Musculoskeletal: Negative for neck pain.  Neurological: Negative for tingling and weakness.  All other systems reviewed and are negative.  Past Medical History:  Diagnosis Date  . Anxiety   . Asthma   . Cluster headaches   . COPD (chronic obstructive pulmonary disease) (HCC)   . COPD (chronic obstructive pulmonary disease) with chronic bronchitis (HCC)   . GERD (gastroesophageal reflux disease)   . Hyperlipemia   . Hypoglycemia   . Hypothyroidism   . Migraine   . Pneumothorax   . Rectal discharge 07/28/2010  . Shortness of breath   . Sleep apnea    cannot tolerate-not using CPAP  . Sluggishness 07/28/2010   Past Surgical History:  Procedure Laterality Date  . ABDOMINAL HYSTERECTOMY     with bladder suspension  . CHOLECYSTECTOMY    . COLONOSCOPY N/A 07/30/2013   Procedure: COLONOSCOPY;  Surgeon: Malissa HippoNajeeb U Rehman, MD;  Location: [facility];  Service: Endoscopy;  Laterality: N/A;  930  . INCISIONAL HERNIA REPAIR N/A 07/30/2016   Procedure: HERNIA REPAIR INCISIONAL WITH MESH;  Surgeon:  Franky MachoMark Jenkins, MD;  Location: [facility];  Service: General;  Laterality: N/A;  . LOBECTOMY     right lung   . TUBAL LIGATION      Current Outpatient Prescriptions:  .  acetaminophen (TYLENOL) 500 MG tablet, Take 1,000 mg by mouth every 6 (six) hours as needed for moderate pain., Disp: , Rfl:  .  albuterol (VENTOLIN HFA) 108 (90 BASE) MCG/ACT inhaler, Inhale 2 puffs into the lungs every 4 (four) hours as needed for shortness of breath. , Disp: , Rfl:  .  aspirin-acetaminophen-caffeine (EXCEDRIN MIGRAINE) 250-250-65 MG tablet, Take 2 tablets by mouth every 6 (six) hours as needed for headache or migraine., Disp: , Rfl:  .  Calcium Carbonate-Vitamin D (CALCIUM 600+D PO), Take 1 tablet by mouth 2 (two) times daily., Disp: , Rfl:  .  cetirizine (ZYRTEC) 10 MG tablet, Take 10 mg by mouth daily., Disp: , Rfl:  .  HYDROcodone-acetaminophen (NORCO/VICODIN) 5-325 MG tablet, Take 1 tablet by mouth every 4 (four) hours as needed for moderate pain (Must last 14 days.Do not take and drive a car or use machinery.)., Disp: 56 tablet, Rfl: 0 .  levothyroxine (SYNTHROID, LEVOTHROID) 75 MCG tablet, Take 75 mcg by mouth daily before breakfast., Disp: , Rfl:  .  LORazepam (ATIVAN) 0.5 MG tablet, Take 1 tablet (0.5 mg total) by mouth at bedtime as needed for anxiety., Disp: 30 tablet, Rfl: 1 .  mometasone-formoterol (DULERA) 200-5 MCG/ACT AERO, Inhale 1 puff into the lungs 2 (two) times daily., Disp: ,  Rfl:  .  pantoprazole (PROTONIX) 40 MG tablet, Take 40 mg by mouth daily., Disp: , Rfl:  .  Polyethyl Glycol-Propyl Glycol (SYSTANE ULTRA OP), Apply 1 drop to eye daily as needed (dry eyes)., Disp: , Rfl:  .  predniSONE (STERAPRED UNI-PAK 21 TAB) 5 MG (21) TBPK tablet, Take 6 pills first day; 5 pills second day; 4 pills third day; 3 pills fourth day; 2 pills next day and 1 pill last day., Disp: 21 tablet, Rfl: 0 .  SUMAtriptan 6 MG/0.5ML SOAJ, Inject 6 mg as directed daily as needed (migraine). , Disp: , Rfl:  .   Triamcinolone Acetonide (NASACORT ALLERGY 24HR NA), Place 1 spray into the nose daily as needed (allergies)., Disp: , Rfl:  .  valACYclovir (VALTREX) 500 MG tablet, Take 2000mg s at first sign of fever blister outbreak then take 500mg s daily until gone, Disp: , Rfl:   Family History  Problem Relation Age of Onset  . COPD Mother   . Diabetes Mother   . Hyperlipidemia Mother   . Hypertension Mother   . Bipolar disorder Son   . Heart failure Maternal Grandmother   . Hypertension Maternal Grandmother   . Thyroid disease Maternal Grandmother   . Diabetes Paternal Grandmother   . Alzheimer's disease Paternal Grandmother   . Heart failure Paternal Grandfather   . Hypertension Paternal Grandfather   . Hypertension Father   . Colon cancer Neg Hx    Social History  Substance Use Topics  . Smoking status: Former Smoker    Packs/day: 1.00    Years: 30.00    Types: Cigarettes    Quit date: 02/21/2008  . Smokeless tobacco: Never Used  . Alcohol use No     Comment: no   BP 112/81   Pulse 85   Ht 5\' 6"  (1.676 m)   Wt 132 lb (59.9 kg)   BMI 21.31 kg/m   She is a small framed woman, no congenital abnormalities are seen  She is oriented 3  Her mood is flat in her affect is flat  No ambulatory disturbances are observed  On the left side we find no tenderness or swelling in the shoulder joint. We find full range of motion with normal stability and rotator cuff strength. Skin is normal. The pulses are excellent. Sensation is normal as well.  On the right side with her arm at her side she only has 35 of external rotation she has 45 of abduction she has 40 of flexion. She has tenderness in the anterior joint line and posterior joint line and tenderness over the triceps  Full range of motion of the cervical spine and no tenderness or muscle spasm was noted  Sulcus sign for stability of the shoulder was normal, skin overlying the shoulder no erythema no mass Cervical lymph nodes were  normal axillary lymph nodes were normal  Right arm sensation was intact and normal and pulse was excellent capillary refill was normal  Lower extremities normal alignment no contractures no joint subluxations muscle tone normal skin warm dry intact no masses neurovascular exam intact   X-ray was done December 2017 FINDINGS: There is no evidence of fracture or dislocation. There is no evidence of arthropathy or other focal bone abnormality. Soft tissues are unremarkable.  IMPRESSION: Normal right shoulder.  MRI was from an outside vendor and it showed a type I acromion, tendinopathy without atrophy of the supraspinatus tendon and undersurface 0.3 cm tear with no retraction  I reviewed the MRI and it  indeed shows an undersurface tear of the supraspinatus tendon  Assessment and plan  Encounter Diagnoses  Name Primary?  . Incomplete tear of right rotator cuff Yes  . Pain and swelling of right shoulder     The patient seems to have more symptoms than what we see on MRI. We discussed this and I told her that she may or may not improve with surgery. She is in such pain and dysfunction in terms of her ability to use her arm that she is wishing to undergo surgery anyway.  She understands she has a 3-6 month recovery  We have decided to do a exam under anesthesia of the right shoulder with possible manipulation under anesthesia arthroscopic evaluation and debridement plus or minus rotator cuff repair which may include open incision

## 2016-10-04 ENCOUNTER — Other Ambulatory Visit: Payer: Self-pay | Admitting: *Deleted

## 2016-10-04 ENCOUNTER — Encounter: Payer: Self-pay | Admitting: *Deleted

## 2016-10-04 NOTE — Progress Notes (Signed)
Per patient plan pre auth not required  Reference [id number]

## 2016-10-05 NOTE — Patient Instructions (Signed)
[name]  10/05/2016     @PREFPERIOPPHARMACY @   Your procedure is scheduled on  10/11/2016   Report to [facility] at  1100  A.M.  Call this number if you have problems the morning of surgery:  [phone]   Remember:  Do not eat food or drink liquids after midnight.  Take these medicines the morning of surgery with A SIP OF WATER  Hydrocodone, levothyroxine, zyrtec, ativan. Use your inhalers before you come.   Do not wear jewelry, make-up or nail polish.  Do not wear lotions, powders, or perfumes, or deoderant.  Do not shave 48 hours prior to surgery.  Men may shave face and neck.  Do not bring valuables to the hospital.  [facility] is not responsible for any belongings or valuables.  Contacts, dentures or bridgework may not be worn into surgery.  Leave your suitcase in the car.  After surgery it may be brought to your room.  For patients admitted to the hospital, discharge time will be determined by your treatment team.  Patients discharged the day of surgery will not be allowed to drive home.   Name and phone number of your driver:   family Special instructions:   None  Please read over the following fact sheets that you were given. Anesthesia Post-op Instructions and Care and Recovery After Surgery       Surgery for Rotator Cuff Tear The rotator cuff is a group of muscles and connective tissues (tendons) that surround the shoulder joint and keep the upper arm bone (humerus) in the shoulder socket. A tendon is the place on a muscle where it attaches to a bone. Surgery may be done to repair a partial or complete tear in the rotator cuff that cannot be treated by nonsurgical methods. The exact procedure that you have depends on your injury. If you have a partial tear, you may have surgery to reattach a tendon to the humerus. If you have a complete tear, you may have surgery to sew the two sides of the tear back together. Surgery may be done through  small incisions using an operating telescope (arthroscope), through a larger (open) incision, or through a combination of both. Tell a health care provider about:  Any allergies you have.  All medicines you are taking, including vitamins, herbs, eye drops, creams, and over-the-counter medicines.  Any problems you or family members have had with anesthetic medicines.  Any blood disorders you have.  Any surgeries you have had.  Any medical conditions you have.  Whether you are pregnant or may be pregnant. What are the risks? Generally, this is a safe procedure. However, problems may occur, including:  Infection.  Bleeding.  Allergic reactions to medicines or materials used during the procedure.  Damage to nerves, blood vessels, or shoulder muscles.  Permanent loss of full shoulder movement (stiffness).  What happens before the procedure? Staying hydrated Follow instructions from your health care provider about hydration, which may include:  Up to 2 hours before the procedure - you may continue to drink clear liquids, such as water, clear fruit juice, black coffee, and plain tea.  Eating and drinking restrictions Follow instructions from your health care provider about eating and drinking, which may include:  8 hours before the procedure - stop eating heavy meals or foods such as meat, fried foods, or fatty foods.  6 hours before the procedure - stop eating light meals or foods, such as  toast or cereal.  6 hours before the procedure - stop drinking milk or drinks that contain milk.  2 hours before the procedure - stop drinking clear liquids.  Other instructions  Ask your health care provider about: ? Changing or stopping your regular medicines. This is especially important if you are taking diabetes medicines or blood thinners. ? Taking medicines such as aspirin and ibuprofen. These medicines can thin your blood. Do not take these medicines before your procedure if your  health care provider instructs you not to.  Plan to have someone take you home from the hospital or clinic.  If you will be going home right after the procedure, plan to have someone with you for 24 hours.  Ask your health care provider how your surgical site will be marked or identified.  You may be given antibiotic medicine to help prevent infection. What happens during the procedure?  To reduce your risk of infection: ? Your health care team will wash or sanitize their hands. ? Your skin will be washed with soap.  An IV tube will be inserted into one of your veins.  You will be given one or more of the following: ? A medicine to help you relax (sedative). ? A medicine to make you fall asleep (general anesthetic). ? A medicine that is injected into an area of your body to numb everything beyond the injection site (regional anesthetic).  Your surgeon will move your shoulder to observe your injury.  If you are having arthroscopic surgery: ? Small incisions will be made in the front and back of your shoulder. ? An arthroscope will be inserted through these incisions to examine the inside of your shoulder and plan the surgery.  If you are having open surgery, a wider incision will be made in your shoulder.  Some of the muscle covering your shoulder (deltoid) may be moved to expose your rotator cuff.  Bony growths that might interfere with healing will be removed.  Your rotator cuff will be trimmed around the area where it has torn away from your humerus.  If your rotator cuff is completely torn, the split ends will be sewn back together.  Anchoring inserts will be placed into your humerus in the area where the tendon has torn away from the bone.  The torn end of your rotator cuff will be re-attached (anchored) to your humerus using stitches and small screws.  Your incisions will be closed with sutures.  The incision in your skin will be covered with a bandage (dressing) and  medicine.  Your arm will be placed in a sling. The procedure may vary among health care providers and hospitals. What happens after the procedure?  Your blood pressure, heart rate, breathing rate, and blood oxygen level will be monitored often until the medicines you were given have worn off.  You may have some pain. Medicines will be available to help you.  Do not drive for 24 hours if you received a sedative, or until your health care provider approves. This information is not intended to replace advice given to you by your health care provider. Make sure you discuss any questions you have with your health care provider. Document Released: 04/09/2015 Document Revised: 09/01/2015 Document Reviewed: 04/09/2015 Elsevier Interactive Patient Education  2018 [employer].  Surgery for Rotator Cuff Tear, Care After Refer to this sheet in the next few weeks. These instructions provide you with information about caring for yourself after your procedure. Your health care provider may  also give you more specific instructions. Your treatment has been planned according to current medical practices, but problems sometimes occur. Call your health care provider if you have any problems or questions after your procedure. What can I expect after the procedure? After the procedure, it is common to have:  Swelling.  Pain.  Stiffness.  Tenderness.  Follow these instructions at home: If you have a sling:  Wear the sling as told by your health care provider. Remove it only as told by your health care provider.  Loosen the sling if your fingers tingle, become numb, or turn cold and blue.  Do not let your sling get wet if it is not waterproof.  Keep the sling clean. Bathing  Do not take baths, swim, or use a hot tub until your health care provider approves. Ask your health care provider if you can take showers. You may only be allowed to take sponge baths for bathing.  Keep your bandage  (dressing) dry until your health care provider says it can be removed. Incision care  Follow instructions from your health care provider about how to take care of your incision. Make sure you: ? Wash your hands with soap and water before you change your dressing. If soap and water are not available, use hand sanitizer. ? Change your dressing as told by your health care provider. ? Leave stitches (sutures), skin glue, or adhesive strips in place. These skin closures may need to stay in place for 2 weeks or longer. If adhesive strip edges start to loosen and curl up, you may trim the loose edges. Do not remove adhesive strips completely unless your health care provider tells you to do that.  Check your incision area every day for signs of infection. Check for: ? More redness, swelling, or pain. ? More fluid or blood. ? Warmth. ? Pus or a bad smell. Managing pain, stiffness, and swelling   If directed, put ice on your shoulder area. ? Put ice in a plastic bag. ? Place a towel between your skin and the bag. ? Leave the ice on for 20 minutes, 2-3 times a day.  Move your fingers often to avoid stiffness and to lessen swelling.  Raise (elevate) your upper body on pillows when you lie down and when you sleep. ? Do not sleep on the front of your body (abdomen). ? Do not sleep on the side that your surgery was performed on. Driving  Do not drive for 24 hours if you received a medicine to help you relax (sedative) during your procedure.  Do not drive or operate heavy machinery while taking prescription pain medicine.  Ask your health care provider when it is safe for you to drive. Activity  Do not use your arm to support your body weight until your health care provider approves.  Do not lift or hold anything with your arm until your health care provider approves.  Return to your normal activities as told by your health care provider. Ask your health care provider what activities are safe  for you.  Do exercises as told by your health care provider. General instructions   Do not use any tobacco products, such as cigarettes, chewing tobacco, or e-cigarettes. Tobacco can delay healing. If you need help quitting, ask your health care provider.  Take over-the-counter and prescription medicines only as told by your health care provider.  If you were prescribed an antibiotic medicine, take it as told by your health care provider. Do not  stop taking the antibiotic even if you start to feel better.  Keep all follow-up visits as told by your health care provider. This is important. Contact a health care provider if:  You have a fever.  You have more redness, swelling, or pain around your incision.  You have more fluid or blood coming from your incision.  Your incision feels warm to the touch.  You have pus or a bad smell coming from your incision.  You have pain that gets worse or does not get better with medicine. Get help right away if:  You have severe pain.  You lose feeling in your arm or hand.  Your hand or fingers turn very pale or blue. This information is not intended to replace advice given to you by your health care provider. Make sure you discuss any questions you have with your health care provider. Document Released: 03/26/2005 Document Revised: 11/30/2015 Document Reviewed: 04/09/2015 Elsevier Interactive Patient Education  2018 [employer]. General Anesthesia, Adult, Care After These instructions provide you with information about caring for yourself after your procedure. Your health care provider may also give you more specific instructions. Your treatment has been planned according to current medical practices, but problems sometimes occur. Call your health care provider if you have any problems or questions after your procedure. What can I expect after the procedure? After the procedure, it is common to have:  Vomiting.  A sore throat.  Mental  slowness.  It is common to feel:  Nauseous.  Cold or shivery.  Sleepy.  Tired.  Sore or achy, even in parts of your body where you did not have surgery.  Follow these instructions at home: For at least 24 hours after the procedure:  Do not: ? Participate in activities where you could fall or become injured. ? Drive. ? Use heavy machinery. ? Drink alcohol. ? Take sleeping pills or medicines that cause drowsiness. ? Make important decisions or sign legal documents. ? Take care of children on your own.  Rest. Eating and drinking  If you vomit, drink water, juice, or soup when you can drink without vomiting.  Drink enough fluid to keep your urine clear or pale yellow.  Make sure you have little or no nausea before eating solid foods.  Follow the diet recommended by your health care provider. General instructions  Have a responsible adult stay with you until you are awake and alert.  Return to your normal activities as told by your health care provider. Ask your health care provider what activities are safe for you.  Take over-the-counter and prescription medicines only as told by your health care provider.  If you smoke, do not smoke without supervision.  Keep all follow-up visits as told by your health care provider. This is important. Contact a health care provider if:  You continue to have nausea or vomiting at home, and medicines are not helpful.  You cannot drink fluids or start eating again.  You cannot urinate after 8-12 hours.  You develop a skin rash.  You have fever.  You have increasing redness at the site of your procedure. Get help right away if:  You have difficulty breathing.  You have chest pain.  You have unexpected bleeding.  You feel that you are having a life-threatening or urgent problem. This information is not intended to replace advice given to you by your health care provider. Make sure you discuss any questions you have with  your health care provider. Document Released:  07/02/2000 Document Revised: 08/29/2015 Document Reviewed: 03/10/2015 Elsevier Interactive Patient Education  [employer].

## 2016-10-08 ENCOUNTER — Encounter (HOSPITAL_COMMUNITY): Payer: Self-pay

## 2016-10-08 ENCOUNTER — Encounter (HOSPITAL_COMMUNITY)
Admission: RE | Admit: 2016-10-08 | Discharge: 2016-10-08 | Disposition: A | Discharge status: Home / Self Care | Payer: 59 | Source: Ambulatory Visit | Attending: Orthopedic Surgery | Admitting: Orthopedic Surgery

## 2016-10-08 DIAGNOSIS — Z01812 Encounter for preprocedural laboratory examination: Secondary | ICD-10-CM | POA: Diagnosis not present

## 2016-10-08 DIAGNOSIS — E785 Hyperlipidemia, unspecified: Secondary | ICD-10-CM | POA: Diagnosis not present

## 2016-10-08 DIAGNOSIS — M75101 Unspecified rotator cuff tear or rupture of right shoulder, not specified as traumatic: Secondary | ICD-10-CM | POA: Diagnosis not present

## 2016-10-08 DIAGNOSIS — Z902 Acquired absence of lung [part of]: Secondary | ICD-10-CM | POA: Diagnosis not present

## 2016-10-08 DIAGNOSIS — Z833 Family history of diabetes mellitus: Secondary | ICD-10-CM | POA: Diagnosis not present

## 2016-10-08 DIAGNOSIS — Z825 Family history of asthma and other chronic lower respiratory diseases: Secondary | ICD-10-CM | POA: Diagnosis not present

## 2016-10-08 DIAGNOSIS — K219 Gastro-esophageal reflux disease without esophagitis: Secondary | ICD-10-CM | POA: Diagnosis not present

## 2016-10-08 DIAGNOSIS — Z90710 Acquired absence of both cervix and uterus: Secondary | ICD-10-CM | POA: Diagnosis not present

## 2016-10-08 DIAGNOSIS — J449 Chronic obstructive pulmonary disease, unspecified: Secondary | ICD-10-CM | POA: Diagnosis not present

## 2016-10-08 DIAGNOSIS — Z87891 Personal history of nicotine dependence: Secondary | ICD-10-CM | POA: Diagnosis not present

## 2016-10-08 DIAGNOSIS — Z8249 Family history of ischemic heart disease and other diseases of the circulatory system: Secondary | ICD-10-CM | POA: Diagnosis not present

## 2016-10-08 DIAGNOSIS — F419 Anxiety disorder, unspecified: Secondary | ICD-10-CM | POA: Diagnosis not present

## 2016-10-08 DIAGNOSIS — E039 Hypothyroidism, unspecified: Secondary | ICD-10-CM | POA: Diagnosis not present

## 2016-10-08 DIAGNOSIS — Z79899 Other long term (current) drug therapy: Secondary | ICD-10-CM | POA: Diagnosis not present

## 2016-10-08 LAB — SURGICAL PCR SCREEN
MRSA, PCR: INVALID — AB
Staphylococcus aureus: INVALID — AB

## 2016-10-09 NOTE — H&P (Signed)
Preop evaluation  Chief complaint right shoulder pain and stiffness  History this is a 51-year-old female who is followed for pain in her right shoulder by Dr. Keeling she was treated with physical therapy, oral NSAIDs including prednisone, subacromial injection and did not improve. She did have an MRI which showed a small rotator cuff tear undersurface supraspinatus approximately 0.3 cm  She reports she is in severe pain has severe loss of motion has weakness in the right shoulder primarily in the anterior and posterior joint line and radiates down the back of her arm in the triceps area but not below the elbow.  She denies any neck pain or history of cervical disc disease  She is employed as a baker at [employer]  Review of Systems  Constitutional: Negative for chills, fever and malaise/fatigue.  Respiratory: Positive for shortness of breath.   Cardiovascular: Negative for chest pain.  Musculoskeletal: Negative for neck pain.  Neurological: Negative for tingling and weakness.  All other systems reviewed and are negative.  Past Medical History:  Diagnosis Date  . Anxiety   . Asthma   . Cluster headaches   . COPD (chronic obstructive pulmonary disease) (HCC)   . COPD (chronic obstructive pulmonary disease) with chronic bronchitis (HCC)   . GERD (gastroesophageal reflux disease)   . Hyperlipemia   . Hypoglycemia   . Hypothyroidism   . Migraine   . Pneumothorax   . Rectal discharge 07/28/2010  . Shortness of breath   . Sleep apnea    cannot tolerate-not using CPAP  . Sluggishness 07/28/2010   Past Surgical History:  Procedure Laterality Date  . ABDOMINAL HYSTERECTOMY     with bladder suspension  . CHOLECYSTECTOMY    . COLONOSCOPY N/A 07/30/2013   Procedure: COLONOSCOPY;  Surgeon: Najeeb U Rehman, MD;  Location: [facility] SUITE;  Service: Endoscopy;  Laterality: N/A;  930  . INCISIONAL HERNIA REPAIR N/A 07/30/2016   Procedure: HERNIA REPAIR INCISIONAL WITH MESH;  Surgeon:  Mark Jenkins, MD;  Location: [facility] ORS;  Service: General;  Laterality: N/A;  . LOBECTOMY     right lung   . TUBAL LIGATION      Current Outpatient Prescriptions:  .  acetaminophen (TYLENOL) 500 MG tablet, Take 1,000 mg by mouth every 6 (six) hours as needed for moderate pain., Disp: , Rfl:  .  albuterol (VENTOLIN HFA) 108 (90 BASE) MCG/ACT inhaler, Inhale 2 puffs into the lungs every 4 (four) hours as needed for shortness of breath. , Disp: , Rfl:  .  aspirin-acetaminophen-caffeine (EXCEDRIN MIGRAINE) 250-250-65 MG tablet, Take 2 tablets by mouth every 6 (six) hours as needed for headache or migraine., Disp: , Rfl:  .  Calcium Carbonate-Vitamin D (CALCIUM 600+D PO), Take 1 tablet by mouth 2 (two) times daily., Disp: , Rfl:  .  cetirizine (ZYRTEC) 10 MG tablet, Take 10 mg by mouth daily., Disp: , Rfl:  .  HYDROcodone-acetaminophen (NORCO/VICODIN) 5-325 MG tablet, Take 1 tablet by mouth every 4 (four) hours as needed for moderate pain (Must last 14 days.Do not take and drive a car or use machinery.)., Disp: 56 tablet, Rfl: 0 .  levothyroxine (SYNTHROID, LEVOTHROID) 75 MCG tablet, Take 75 mcg by mouth daily before breakfast., Disp: , Rfl:  .  LORazepam (ATIVAN) 0.5 MG tablet, Take 1 tablet (0.5 mg total) by mouth at bedtime as needed for anxiety., Disp: 30 tablet, Rfl: 1 .  mometasone-formoterol (DULERA) 200-5 MCG/ACT AERO, Inhale 1 puff into the lungs 2 (two) times daily., Disp: ,   Rfl:  .  pantoprazole (PROTONIX) 40 MG tablet, Take 40 mg by mouth daily., Disp: , Rfl:  .  Polyethyl Glycol-Propyl Glycol (SYSTANE ULTRA OP), Apply 1 drop to eye daily as needed (dry eyes)., Disp: , Rfl:  .  predniSONE (STERAPRED UNI-PAK 21 TAB) 5 MG (21) TBPK tablet, Take 6 pills first day; 5 pills second day; 4 pills third day; 3 pills fourth day; 2 pills next day and 1 pill last day., Disp: 21 tablet, Rfl: 0 .  SUMAtriptan 6 MG/0.5ML SOAJ, Inject 6 mg as directed daily as needed (migraine). , Disp: , Rfl:  .   Triamcinolone Acetonide (NASACORT ALLERGY 24HR NA), Place 1 spray into the nose daily as needed (allergies)., Disp: , Rfl:  .  valACYclovir (VALTREX) 500 MG tablet, Take 2000mgs at first sign of fever blister outbreak then take 500mgs daily until gone, Disp: , Rfl:   Family History  Problem Relation Age of Onset  . COPD Mother   . Diabetes Mother   . Hyperlipidemia Mother   . Hypertension Mother   . Bipolar disorder Son   . Heart failure Maternal Grandmother   . Hypertension Maternal Grandmother   . Thyroid disease Maternal Grandmother   . Diabetes Paternal Grandmother   . Alzheimer's disease Paternal Grandmother   . Heart failure Paternal Grandfather   . Hypertension Paternal Grandfather   . Hypertension Father   . Colon cancer Neg Hx    Social History  Substance Use Topics  . Smoking status: Former Smoker    Packs/day: 1.00    Years: 30.00    Types: Cigarettes    Quit date: 02/21/2008  . Smokeless tobacco: Never Used  . Alcohol use No     Comment: no   BP 112/81   Pulse 85   Ht 5' 6" (1.676 m)   Wt 132 lb (59.9 kg)   BMI 21.31 kg/m   She is a small framed woman, no congenital abnormalities are seen  She is oriented 3  Her mood is flat in her affect is flat  No ambulatory disturbances are observed  On the left side we find no tenderness or swelling in the shoulder joint. We find full range of motion with normal stability and rotator cuff strength. Skin is normal. The pulses are excellent. Sensation is normal as well.  On the right side with her arm at her side she only has 35 of external rotation she has 45 of abduction she has 40 of flexion. She has tenderness in the anterior joint line and posterior joint line and tenderness over the triceps  Full range of motion of the cervical spine and no tenderness or muscle spasm was noted  Sulcus sign for stability of the shoulder was normal, skin overlying the shoulder no erythema no mass Cervical lymph nodes were  normal axillary lymph nodes were normal  Right arm sensation was intact and normal and pulse was excellent capillary refill was normal  Lower extremities normal alignment no contractures no joint subluxations muscle tone normal skin warm dry intact no masses neurovascular exam intact   X-ray was done December 2017 FINDINGS: There is no evidence of fracture or dislocation. There is no evidence of arthropathy or other focal bone abnormality. Soft tissues are unremarkable.  IMPRESSION: Normal right shoulder.  MRI was from an outside vendor and it showed a type I acromion, tendinopathy without atrophy of the supraspinatus tendon and undersurface 0.3 cm tear with no retraction  I reviewed the MRI and it   indeed shows an undersurface tear of the supraspinatus tendon  Assessment and plan  Encounter Diagnoses  Name Primary?  . Incomplete tear of right rotator cuff Yes  . Pain and swelling of right shoulder     The patient seems to have more symptoms than what we see on MRI. We discussed this and I told her that she may or may not improve with surgery. She is in such pain and dysfunction in terms of her ability to use her arm that she is wishing to undergo surgery anyway.  She understands she has a 3-6 month recovery  We have decided to do a exam under anesthesia of the right shoulder with possible manipulation under anesthesia arthroscopic evaluation and debridement plus or minus rotator cuff repair which may include open incision 

## 2016-10-10 LAB — MRSA CULTURE: Culture: NOT DETECTED

## 2016-10-11 ENCOUNTER — Ambulatory Visit (HOSPITAL_COMMUNITY): Payer: 59 | Admitting: Anesthesiology

## 2016-10-11 ENCOUNTER — Encounter (HOSPITAL_COMMUNITY)
Admission: RE | Disposition: A | Discharge status: Home / Self Care | Payer: Self-pay | Source: Ambulatory Visit | Attending: Orthopedic Surgery

## 2016-10-11 ENCOUNTER — Ambulatory Visit (HOSPITAL_COMMUNITY)
Admission: RE | Admit: 2016-10-11 | Discharge: 2016-10-11 | Disposition: A | Discharge status: Home / Self Care | Payer: 59 | Source: Ambulatory Visit | Attending: Orthopedic Surgery | Admitting: Orthopedic Surgery

## 2016-10-11 ENCOUNTER — Telehealth: Payer: Self-pay | Admitting: Orthopedic Surgery

## 2016-10-11 ENCOUNTER — Encounter (HOSPITAL_COMMUNITY): Payer: Self-pay | Admitting: *Deleted

## 2016-10-11 DIAGNOSIS — Z833 Family history of diabetes mellitus: Secondary | ICD-10-CM | POA: Insufficient documentation

## 2016-10-11 DIAGNOSIS — J449 Chronic obstructive pulmonary disease, unspecified: Secondary | ICD-10-CM | POA: Insufficient documentation

## 2016-10-11 DIAGNOSIS — Z90710 Acquired absence of both cervix and uterus: Secondary | ICD-10-CM | POA: Insufficient documentation

## 2016-10-11 DIAGNOSIS — S46811A Strain of other muscles, fascia and tendons at shoulder and upper arm level, right arm, initial encounter: Secondary | ICD-10-CM

## 2016-10-11 DIAGNOSIS — F419 Anxiety disorder, unspecified: Secondary | ICD-10-CM | POA: Insufficient documentation

## 2016-10-11 DIAGNOSIS — Z825 Family history of asthma and other chronic lower respiratory diseases: Secondary | ICD-10-CM | POA: Insufficient documentation

## 2016-10-11 DIAGNOSIS — S4381XA Sprain of other specified parts of right shoulder girdle, initial encounter: Secondary | ICD-10-CM | POA: Diagnosis not present

## 2016-10-11 DIAGNOSIS — Z87891 Personal history of nicotine dependence: Secondary | ICD-10-CM | POA: Insufficient documentation

## 2016-10-11 DIAGNOSIS — Z79899 Other long term (current) drug therapy: Secondary | ICD-10-CM | POA: Insufficient documentation

## 2016-10-11 DIAGNOSIS — E785 Hyperlipidemia, unspecified: Secondary | ICD-10-CM | POA: Insufficient documentation

## 2016-10-11 DIAGNOSIS — K219 Gastro-esophageal reflux disease without esophagitis: Secondary | ICD-10-CM | POA: Insufficient documentation

## 2016-10-11 DIAGNOSIS — Z8249 Family history of ischemic heart disease and other diseases of the circulatory system: Secondary | ICD-10-CM | POA: Insufficient documentation

## 2016-10-11 DIAGNOSIS — M75101 Unspecified rotator cuff tear or rupture of right shoulder, not specified as traumatic: Secondary | ICD-10-CM | POA: Insufficient documentation

## 2016-10-11 DIAGNOSIS — Z902 Acquired absence of lung [part of]: Secondary | ICD-10-CM | POA: Insufficient documentation

## 2016-10-11 DIAGNOSIS — E039 Hypothyroidism, unspecified: Secondary | ICD-10-CM | POA: Insufficient documentation

## 2016-10-11 DIAGNOSIS — Z01812 Encounter for preprocedural laboratory examination: Secondary | ICD-10-CM | POA: Insufficient documentation

## 2016-10-11 HISTORY — PX: SHOULDER ARTHROSCOPY WITH ROTATOR CUFF REPAIR: SHX5685

## 2016-10-11 HISTORY — PX: EXAM UNDER ANESTHESIA WITH MANIPULATION OF SHOULDER: SHX5817

## 2016-10-11 SURGERY — ARTHROSCOPY, SHOULDER, WITH ROTATOR CUFF REPAIR
Anesthesia: General | Laterality: Right

## 2016-10-11 MED ORDER — CHLORHEXIDINE GLUCONATE 4 % EX LIQD: 60.0000 mL | Freq: Once | CUTANEOUS | Status: DC

## 2016-10-11 MED ORDER — DEXAMETHASONE SODIUM PHOSPHATE 4 MG/ML IJ SOLN
INTRAMUSCULAR | Status: DC | PRN
  Administered 2016-10-11: 4 mg via INTRAVENOUS

## 2016-10-11 MED ORDER — PHENYLEPHRINE 40 MCG/ML (10ML) SYRINGE FOR IV PUSH (FOR BLOOD PRESSURE SUPPORT)
PREFILLED_SYRINGE | INTRAVENOUS | Status: AC
  Filled 2016-10-11: qty 10

## 2016-10-11 MED ORDER — SUGAMMADEX SODIUM 500 MG/5ML IV SOLN
INTRAVENOUS | Status: AC
  Filled 2016-10-11: qty 5

## 2016-10-11 MED ORDER — ONDANSETRON HCL 4 MG/2ML IJ SOLN
INTRAMUSCULAR | Status: AC
  Filled 2016-10-11: qty 2

## 2016-10-11 MED ORDER — IPRATROPIUM-ALBUTEROL 0.5-2.5 (3) MG/3ML IN SOLN
3.0000 mL | Freq: Once | RESPIRATORY_TRACT | Status: AC
  Administered 2016-10-11: 3 mL via RESPIRATORY_TRACT

## 2016-10-11 MED ORDER — LIDOCAINE HCL (PF) 1 % IJ SOLN
INTRAMUSCULAR | Status: AC
  Filled 2016-10-11: qty 5

## 2016-10-11 MED ORDER — FENTANYL CITRATE (PF) 250 MCG/5ML IJ SOLN
INTRAMUSCULAR | Status: AC
  Filled 2016-10-11: qty 5

## 2016-10-11 MED ORDER — MIDAZOLAM HCL 2 MG/2ML IJ SOLN
INTRAMUSCULAR | Status: AC
  Filled 2016-10-11: qty 2

## 2016-10-11 MED ORDER — EPINEPHRINE PF 1 MG/ML IJ SOLN
INTRAMUSCULAR | Status: AC
  Filled 2016-10-11: qty 3

## 2016-10-11 MED ORDER — IPRATROPIUM-ALBUTEROL 0.5-2.5 (3) MG/3ML IN SOLN
RESPIRATORY_TRACT | Status: AC
  Filled 2016-10-11: qty 3

## 2016-10-11 MED ORDER — SODIUM CHLORIDE 0.9 % IR SOLN
Status: DC | PRN
  Administered 2016-10-11: 13:00:00

## 2016-10-11 MED ORDER — ONDANSETRON HCL 4 MG/2ML IJ SOLN
INTRAMUSCULAR | Status: DC | PRN
  Administered 2016-10-11: 4 mg via INTRAVENOUS

## 2016-10-11 MED ORDER — DEXAMETHASONE SODIUM PHOSPHATE 4 MG/ML IJ SOLN
INTRAMUSCULAR | Status: AC
  Filled 2016-10-11: qty 1

## 2016-10-11 MED ORDER — FENTANYL CITRATE (PF) 100 MCG/2ML IJ SOLN
INTRAMUSCULAR | Status: DC | PRN
  Administered 2016-10-11: 50 ug via INTRAVENOUS
  Administered 2016-10-11: 100 ug via INTRAVENOUS

## 2016-10-11 MED ORDER — BUPIVACAINE-EPINEPHRINE 0.5% -1:200000 IJ SOLN
INTRAMUSCULAR | Status: DC | PRN
  Administered 2016-10-11 (×2): 30 mL

## 2016-10-11 MED ORDER — LACTATED RINGERS IV SOLN
INTRAVENOUS | Status: DC
  Administered 2016-10-11 (×2): via INTRAVENOUS

## 2016-10-11 MED ORDER — LIDOCAINE HCL (PF) 1 % IJ SOLN
INTRAMUSCULAR | Status: DC | PRN
  Administered 2016-10-11: 40 mg

## 2016-10-11 MED ORDER — VANCOMYCIN HCL IN DEXTROSE 1-5 GM/200ML-% IV SOLN
1000.0000 mg | INTRAVENOUS | Status: AC
  Administered 2016-10-11: 1000 mg via INTRAVENOUS

## 2016-10-11 MED ORDER — MIDAZOLAM HCL 2 MG/2ML IJ SOLN
1.0000 mg | INTRAMUSCULAR | Status: AC
Start: 2016-10-11 — End: 2016-10-11
  Administered 2016-10-11 (×2): 2 mg via INTRAVENOUS

## 2016-10-11 MED ORDER — HYDROCODONE-ACETAMINOPHEN 5-325 MG PO TABS: 1.0000 | ORAL_TABLET | ORAL | 0 refills | Status: DC | PRN

## 2016-10-11 MED ORDER — SUGAMMADEX SODIUM 200 MG/2ML IV SOLN
INTRAVENOUS | Status: DC | PRN
  Administered 2016-10-11: 125 mg via INTRAVENOUS

## 2016-10-11 MED ORDER — PHENYLEPHRINE HCL 10 MG/ML IJ SOLN
INTRAMUSCULAR | Status: DC | PRN
  Administered 2016-10-11: 40 ug via INTRAVENOUS
  Administered 2016-10-11 (×2): 80 ug via INTRAVENOUS
  Administered 2016-10-11 (×2): 40 ug via INTRAVENOUS

## 2016-10-11 MED ORDER — PROPOFOL 10 MG/ML IV BOLUS
INTRAVENOUS | Status: AC
  Filled 2016-10-11: qty 20

## 2016-10-11 MED ORDER — ROCURONIUM BROMIDE 100 MG/10ML IV SOLN
INTRAVENOUS | Status: DC | PRN
  Administered 2016-10-11: 40 mg via INTRAVENOUS

## 2016-10-11 MED ORDER — 0.9 % SODIUM CHLORIDE (POUR BTL) OPTIME
TOPICAL | Status: DC | PRN
  Administered 2016-10-11: 1000 mL

## 2016-10-11 MED ORDER — BUPIVACAINE-EPINEPHRINE (PF) 0.5% -1:200000 IJ SOLN
INTRAMUSCULAR | Status: AC
  Filled 2016-10-11: qty 60

## 2016-10-11 MED ORDER — VANCOMYCIN HCL IN DEXTROSE 1-5 GM/200ML-% IV SOLN
INTRAVENOUS | Status: AC
  Filled 2016-10-11: qty 200

## 2016-10-11 MED ORDER — ROCURONIUM BROMIDE 50 MG/5ML IV SOLN
INTRAVENOUS | Status: AC
  Filled 2016-10-11: qty 1

## 2016-10-11 MED ORDER — HYDROMORPHONE HCL 1 MG/ML IJ SOLN
0.2500 mg | INTRAMUSCULAR | Status: DC | PRN
  Administered 2016-10-11 (×2): 0.5 mg via INTRAVENOUS
  Filled 2016-10-11: qty 1

## 2016-10-11 MED ORDER — PROPOFOL 10 MG/ML IV BOLUS
INTRAVENOUS | Status: DC | PRN
Start: 2016-10-11 — End: 2016-10-11
  Administered 2016-10-11: 140 mg via INTRAVENOUS

## 2016-10-11 SURGICAL SUPPLY — 96 items
BAG HAMPER (MISCELLANEOUS) ×2 IMPLANT
BIT DRILL 2.0MX128MM (BIT) IMPLANT
BLADE AGGRESSIVE PLUS 4.0 (BLADE) ×1 IMPLANT
BLADE AVERAGE 25X9 (BLADE) IMPLANT
BLADE HEX COATED 2.75 (ELECTRODE) ×1 IMPLANT
BLADE SURG 15 STRL LF DISP TIS (BLADE) ×1 IMPLANT
BLADE SURG 15 STRL SS (BLADE)
BLADE SURG SZ10 CARB STEEL (BLADE) ×2 IMPLANT
BLADE SURG SZ11 CARB STEEL (BLADE) ×1 IMPLANT
BNDG COHESIVE 4X5 TAN STRL (GAUZE/BANDAGES/DRESSINGS) ×2 IMPLANT
BUR 5.0 BARRELL (BURR) IMPLANT
BUR BARRELL 4.0 (BURR) IMPLANT
BUR ROUND 5.0 (BURR) IMPLANT
CANNULA DRILOCK 5.0X75 (CANNULA) IMPLANT
CANNULA DRILOCK 6.5X75 (CANNULA) ×2 IMPLANT
CANNULA DRILOCK 8.0X75 (CANNULA) IMPLANT
CHLORAPREP W/TINT 26ML (MISCELLANEOUS) ×2 IMPLANT
CLOTH BEACON ORANGE TIMEOUT ST (SAFETY) ×2 IMPLANT
COVER LIGHT HANDLE STERIS (MISCELLANEOUS) ×4 IMPLANT
COVER PROBE W GEL 5X96 (DRAPES) ×1 IMPLANT
DECANTER SPIKE VIAL GLASS SM (MISCELLANEOUS) ×3 IMPLANT
DRAPE PROXIMA HALF (DRAPES) ×1 IMPLANT
DRAPE SHOULDER BEACH CHAIR (DRAPES) ×2 IMPLANT
DRAPE U-SHAPE 47X51 STRL (DRAPES) ×1 IMPLANT
DRESSING MEPILEX BORDER 6X8 (GAUZE/BANDAGES/DRESSINGS) IMPLANT
DRSG MEPILEX BORDER 4X8 (GAUZE/BANDAGES/DRESSINGS) IMPLANT
DRSG MEPILEX BORDER 6X8 (GAUZE/BANDAGES/DRESSINGS) ×2
ELECT REM PT RETURN 9FT ADLT (ELECTROSURGICAL) ×2
ELECTRODE REM PT RTRN 9FT ADLT (ELECTROSURGICAL) ×1 IMPLANT
FIBERSTICK 2 (SUTURE) IMPLANT
GAUZE SPONGE 4X4 16PLY XRAY LF (GAUZE/BANDAGES/DRESSINGS) ×2 IMPLANT
GLOVE BIO SURGEON STRL SZ7 (GLOVE) ×1 IMPLANT
GLOVE BIOGEL PI IND STRL 7.0 (GLOVE) ×1 IMPLANT
GLOVE BIOGEL PI INDICATOR 7.0 (GLOVE) ×2
GLOVE ECLIPSE 6.5 STRL STRAW (GLOVE) ×1 IMPLANT
GLOVE SKINSENSE NS SZ8.0 LF (GLOVE) ×1
GLOVE SKINSENSE STRL SZ8.0 LF (GLOVE) ×1 IMPLANT
GLOVE SS N UNI LF 8.5 STRL (GLOVE) ×2 IMPLANT
GOWN STRL REUS W/TWL LRG LVL3 (GOWN DISPOSABLE) ×5 IMPLANT
GOWN STRL REUS W/TWL XL LVL3 (GOWN DISPOSABLE) ×2 IMPLANT
IMMOBILIZER SHOULDER LGE (ORTHOPEDIC SUPPLIES) IMPLANT
IMMOBILIZER SHOULDER MED (ORTHOPEDIC SUPPLIES) ×1 IMPLANT
INST SET MINOR BONE (KITS) ×1 IMPLANT
IV NS IRRIG 3000ML ARTHROMATIC (IV SOLUTION) ×4 IMPLANT
KIT BLADEGUARD II DBL (SET/KITS/TRAYS/PACK) ×2 IMPLANT
KIT POSITION SHOULDER SCHLEI (MISCELLANEOUS) ×2 IMPLANT
KIT ROOM TURNOVER APOR (KITS) ×2 IMPLANT
MANIFOLD NEPTUNE II (INSTRUMENTS) ×2 IMPLANT
MARKER SKIN DUAL TIP RULER LAB (MISCELLANEOUS) ×3 IMPLANT
NDL HYPO 18GX1.5 BLUNT FILL (NEEDLE) ×1 IMPLANT
NDL HYPO 21X1.5 SAFETY (NEEDLE) ×1 IMPLANT
NDL MAYO 6 CRC TAPER PT (NEEDLE) IMPLANT
NDL SCORPION (NEEDLE) IMPLANT
NDL SPNL 18GX3.5 QUINCKE PK (NEEDLE) ×1 IMPLANT
NEEDLE HYPO 18GX1.5 BLUNT FILL (NEEDLE) ×2 IMPLANT
NEEDLE HYPO 21X1.5 SAFETY (NEEDLE) ×2 IMPLANT
NEEDLE MAYO 6 CRC TAPER PT (NEEDLE) IMPLANT
NEEDLE SCORPION (NEEDLE) IMPLANT
NEEDLE SPNL 18GX3.5 QUINCKE PK (NEEDLE) ×2 IMPLANT
NS IRRIG 1000ML POUR BTL (IV SOLUTION) ×2 IMPLANT
PACK BASIC III (CUSTOM PROCEDURE TRAY) ×2
PACK SRG BSC III STRL LF ECLPS (CUSTOM PROCEDURE TRAY) ×1 IMPLANT
PAD ABD 5X9 TENDERSORB (GAUZE/BANDAGES/DRESSINGS) IMPLANT
PAD ARMBOARD 7.5X6 YLW CONV (MISCELLANEOUS) ×2 IMPLANT
PASSER SUT CAPTURE FIRST (SUTURE) IMPLANT
PASSER SUT SWANSON 36MM LOOP (INSTRUMENTS) ×2 IMPLANT
PENCIL HANDSWITCHING (ELECTRODE) ×2 IMPLANT
RASP SM TEAR CROSS CUT (RASP) IMPLANT
SET ARTHROSCOPY INST (INSTRUMENTS) ×2 IMPLANT
SET BASIN LINEN APH (SET/KITS/TRAYS/PACK) ×2 IMPLANT
SLING ARM FOAM STRAP MED (SOFTGOODS) ×1 IMPLANT
STOCKINETTE IMPERVIOUS LG (DRAPES) ×2 IMPLANT
SUT BONE WAX W31G (SUTURE) IMPLANT
SUT ETHIBOND NAB OS 4 #2 30IN (SUTURE) IMPLANT
SUT ETHILON 3 0 FSL (SUTURE) ×1 IMPLANT
SUT FIBERWIRE #2 38 REV NDL BL (SUTURE)
SUT FIBERWIRE #2 38 T-5 BLUE (SUTURE)
SUT LASSO 45 DEGREE (SUTURE) IMPLANT
SUT LASSO 45 DEGREE LEFT (SUTURE) IMPLANT
SUT LASSO 45D RIGHT (SUTURE) IMPLANT
SUT MNCRL 0 VIOLET CTX 36 (SUTURE) ×1 IMPLANT
SUT MON AB 2-0 CT1 36 (SUTURE) IMPLANT
SUT MONOCRYL 0 CTX 36 (SUTURE)
SUT PROLENE 2 0 FS (SUTURE) IMPLANT
SUT VIC AB 1 CT1 27 (SUTURE)
SUT VIC AB 1 CT1 27XBRD ANTBC (SUTURE) IMPLANT
SUTURE FIBERWR #2 38 T-5 BLUE (SUTURE) IMPLANT
SUTURE FIBERWR#2 38 REV NDL BL (SUTURE) IMPLANT
SYR 30ML LL (SYRINGE) ×2 IMPLANT
SYR 5ML LL (SYRINGE) ×2 IMPLANT
SYR BULB IRRIGATION 50ML (SYRINGE) ×1 IMPLANT
TOWEL OR 17X26 4PK STRL BLUE (TOWEL DISPOSABLE) ×1 IMPLANT
TUBING ARTHROSCOPY INFLOW/OUT (IRRIGATION / IRRIGATOR) ×1 IMPLANT
WAND 90 DEG TURBOVAC W/CORD (SURGICAL WAND) ×1 IMPLANT
YANKAUER SUCT 12FT TUBE ARGYLE (SUCTIONS) ×2 IMPLANT
YANKAUER SUCT BULB TIP 10FT TU (MISCELLANEOUS) ×4 IMPLANT

## 2016-10-11 NOTE — Anesthesia Postprocedure Evaluation (Signed)
Anesthesia Post Note  Patient: [name]  Procedure(s) Performed: Procedure(s) (LRB): SHOULDER ARTHROSCOPY (Right) EXAM UNDER ANESTHESIA WITH MANIPULATION OF SHOULDER (Right)  Patient location during evaluation: PACU Anesthesia Type: General Level of consciousness: awake, awake and alert, oriented and patient cooperative Pain management: pain level controlled Vital Signs Assessment: post-procedure vital signs reviewed and stable Respiratory status: spontaneous breathing, nonlabored ventilation and respiratory function stable Cardiovascular status: stable Postop Assessment: no signs of nausea or vomiting Anesthetic complications: no     Last Vitals:  Vitals:   10/11/16 1110 10/11/16 1115  BP: (!) 142/92 (!) 142/84  Resp: 18 16  Temp:      Last Pain:  Vitals:   10/11/16 1102  TempSrc: Oral  PainSc: 6                  Abhiram Criado L

## 2016-10-11 NOTE — Telephone Encounter (Signed)
[facility] to find out when her post op appointment was with Dr Romeo AppleHarrison.  I could not see any indication of this.  Could you please advise?  Thanks

## 2016-10-11 NOTE — Interval H&P Note (Signed)
History and Physical Interval Note:  10/11/2016 10:48 AM  [name]  has presented today for surgery, with the diagnosis of right rotator cuff tear  The various methods of treatment have been discussed with the patient and family. After consideration of risks, benefits and other options for treatment, the patient has consented to  Procedure(s): ARTHROSCOPIC ROTATOR CUFF REPAIR (Right) EXAM UNDER ANESTHESIA WITH MANIPULATION OF SHOULDER (Right) as a surgical intervention .  The patient's history has been reviewed, patient examined, no change in status, stable for surgery.  I have reviewed the patient's chart and labs.  Questions were answered to the patient's satisfaction.     [name]

## 2016-10-11 NOTE — Anesthesia Preprocedure Evaluation (Signed)
Anesthesia Evaluation  Patient identified by MRN, date of birth, ID band Patient awake    Reviewed: Allergy & Precautions, NPO status , Patient's Chart, lab work & pertinent test results  Airway Mallampati: II  TM Distance: >3 FB     Dental  (+) Edentulous Upper, Edentulous Lower   Pulmonary shortness of breath and with exertion, asthma , sleep apnea , COPD, former smoker,    breath sounds clear to auscultation       Cardiovascular negative cardio ROS   Rhythm:Regular Rate:Normal     Neuro/Psych  Headaches, PSYCHIATRIC DISORDERS Anxiety    GI/Hepatic GERD  Medicated and Controlled,  Endo/Other  Hypothyroidism   Renal/GU      Musculoskeletal   Abdominal   Peds  Hematology   Anesthesia Other Findings   Reproductive/Obstetrics                             Anesthesia Physical Anesthesia Plan  ASA: III  Anesthesia Plan: General   Post-op Pain Management:    Induction: Intravenous, Rapid sequence and Cricoid pressure planned  PONV Risk Score and Plan:   Airway Management Planned:   Additional Equipment:   Intra-op Plan:   Post-operative Plan: Extubation in OR  Informed Consent: I have reviewed the patients History and Physical, chart, labs and discussed the procedure including the risks, benefits and alternatives for the proposed anesthesia with the patient or authorized representative who has indicated his/her understanding and acceptance.     Plan Discussed with:   Anesthesia Plan Comments:         Anesthesia Quick Evaluation

## 2016-10-11 NOTE — Discharge Instructions (Signed)
PATIENT INSTRUCTIONS POST-ANESTHESIA  IMMEDIATELY FOLLOWING SURGERY:  Do not drive or operate machinery for the first twenty four hours after surgery.  Do not make any important decisions for twenty four hours after surgery or while taking narcotic pain medications or sedatives.  If you develop intractable nausea and vomiting or a severe headache please notify your doctor immediately.  FOLLOW-UP:  Please make an appointment with your surgeon as instructed. You do not need to follow up with anesthesia unless specifically instructed to do so.  WOUND CARE INSTRUCTIONS (if applicable):  Keep a dry clean dressing on the anesthesia/puncture wound site if there is drainage.  Once the wound has quit draining you may leave it open to air.  Generally you should leave the bandage intact for twenty four hours unless there is drainage.  If the epidural site drains for more than 36-48 hours please call the anesthesia department.  QUESTIONS?:  Please feel free to call your physician or the hospital operator if you have any questions, and they will be happy to assist you.       Shoulder Arthroscopy, Care After Refer to this sheet in the next few weeks. These instructions provide you with information on caring for yourself after your procedure. Your health care provider may also give you more specific instructions. Your treatment has been planned according to current medical practices, but problems sometimes occur. Call your health care provider if you have any problems or questions after your procedure. What can I expect after the procedure? After your procedure, it is typical to have the following:  Pain at the site of the surgical cuts (incisions).  Stiffness in your shoulder. This should gradually decrease over time. Your health care provider may recommend physical therapy to help improve this.  Nausea, vomiting, or constipation. These symptoms can result from taking pain medicine after surgery.  Clear or  red drainage from the incision sites. This is normal for a few days after surgery.  Fatigue. Follow these instructions at home:  Take medicines only as directed by your health care provider.  Use a sling as directed.  There are many different ways to close and cover an incision, including stitches, skin glue, and adhesive strips. Follow your health care provider's instructions on:  Incision care.  Bandage (dressing) changes and removal.  Incision closure removal.  Apply ice to the injured area:  Put ice in a plastic bag.  Place a towel between your skin and the bag.  Leave the ice on for 20 minutes, 2-3 times a day.  If physical therapy and exercises are prescribed by your health care provider, do them as directed.  Keep all follow-up visits as directed by your health care provider. This is important. Contact a health care provider if: You have a fever. Get help right away if:  You have drainage, redness, swelling, or increasing pain at the incision site.  You notice a bad smell coming from the incision site or dressing.  Your incision site breaks open after your stitches or tape has been removed. This information is not intended to replace advice given to you by your health care provider. Make sure you discuss any questions you have with your health care provider. Document Released: 10/21/2013 Document Revised: 11/20/2015 Document Reviewed: 05/15/2013 Elsevier Interactive Patient Education  2017 [employer]    

## 2016-10-11 NOTE — Transfer of Care (Signed)
Immediate Anesthesia Transfer of Care Note  Patient: [name]  Procedure(s) Performed: Procedure(s): SHOULDER ARTHROSCOPY (Right) EXAM UNDER ANESTHESIA WITH MANIPULATION OF SHOULDER (Right)  Patient Location: PACU  Anesthesia Type:General  Level of Consciousness: awake, alert , oriented, drowsy and patient cooperative  Airway & Oxygen Therapy: Patient Spontanous Breathing  Post-op Assessment: Report given to RN and Post -op Vital signs reviewed and stable  Post vital signs: Reviewed and stable 86 NSR, 97.5, 94% on RA, 116/63  Last Vitals:  Vitals:   10/11/16 1110 10/11/16 1115  BP: (!) 142/92 (!) 142/84  Resp: 18 16  Temp:      Last Pain:  Vitals:   10/11/16 1102  TempSrc: Oral  PainSc: 6       Patients Stated Pain Goal: 4 (10/11/16 1102)  Complications: No apparent anesthesia complications

## 2016-10-11 NOTE — Anesthesia Procedure Notes (Signed)
Procedure Name: Intubation Date/Time: 10/11/2016 12:25 PM Performed by: Raenette Rover Pre-anesthesia Checklist: Patient identified, Emergency Drugs available, Suction available and Patient being monitored Patient Re-evaluated:Patient Re-evaluated prior to inductionOxygen Delivery Method: Circle system utilized Preoxygenation: Pre-oxygenation with 100% oxygen Intubation Type: IV induction, Rapid sequence and Cricoid Pressure applied Ventilation: Mask ventilation without difficulty Laryngoscope Size: Mac and 3 Grade View: Grade I Tube type: Oral Tube size: 7.0 mm Number of attempts: 1 Airway Equipment and Method: Stylet Placement Confirmation: ETT inserted through vocal cords under direct vision,  positive ETCO2,  CO2 detector and breath sounds checked- equal and bilateral Secured at: 21 cm Tube secured with: Tape Dental Injury: Teeth and Oropharynx as per pre-operative assessment

## 2016-10-11 NOTE — Op Note (Signed)
10/11/2016  1:20 PM  PATIENT:  [name]  51 y.o. female  PRE-OPERATIVE DIAGNOSIS:  right rotator cuff tear  POST-OPERATIVE DIAGNOSIS:  Right rotator cuff tear involving the subscapularis   PROCEDURE:  Procedure(s): ARTHROSCOPIC ROTATOR CUFF REPAIR (Right) EXAM UNDER ANESTHESIA RIGHT SHOULDER  OPERATIVE FINDINGS The rotator cuff supraspinatus infraspinatus was completely intact. There was some degenerative tearing of the superior leading edge of the subscapularis Biceps anchor intact, biceps tendon clean throughout the biceps tendon sheath Glenohumeral surfaces intact Subacromial space without any   bursitis, abrasions on the superior surface of the rotator cuff without tear   SURGEON:  Surgeon(s) and Role:    * Vickki HearingHarrison, Tayja Manzer E, MD - Primary  PHYSICIAN ASSISTANT:   ASSISTANTS: [place] Nation[place]ty Ashley   ANESTHESIA:   general  EBL:  Total I/O In: 1000 [I.V.:1000] Out: -   BLOOD ADMINISTERED:none  DRAINS: none   LOCAL MEDICATIONS USED:  MARCAINE     SPECIMEN:  No Specimen  DISPOSITION OF SPECIMEN:  N/A  COUNTS:  YES  TOURNIQUET:  * No tourniquets in log *  DICTATION: .Dragon Dictation  The patient was reevaluated in the preop area and the surgical site was confirmed as right shoulder and marked. Chart review completed.  She was taken to the operating room and general anesthesia was administered without complication. She was placed in modified beachchair position on a Schlein positioner and sterile prep and drape were completed  Timeout was executed completed  I examined the shoulder and anesthesia and under anesthesia there was no stiffness as noted preop in the office she had complete external rotation abduction forward elevation and abduction external rotation and internal rotation  This was followed by: Posterior portal was established the scope was placed in the joint thoroughly evaluation glenohumeral joint and good visualization revealed clearly intact  rotator cuff on the articular surface side and I was able to be all the way back to the infraspinatus  The glenoid labrum looked intact glenoid axilla was normal the humeral head was normal. Biceps tendon anchor and sheath were normal.  The subscapularis leading edge had some degenerative fraying. We made 2 anterior portals and debrided that.  I then placed a scope and subacromial space which was clean. The rotator cuff was evaluated by internally and externally rotating the arm in abduction and we said we found some abrasions over the cuff but there was no tear  She had a type I acromion so no decompression was needed  I washed the joint and irrigated thoroughly and then injected Marcaine with epinephrine  A closed portals with 3-0 nylon suture  Sterile dressings were applied PLAN OF CARE: Discharge to home after PACU  PATIENT DISPOSITION:  PACU - hemodynamically stable.   Delay start of Pharmacological VTE agent (>24hrs) due to surgical blood loss or risk of bleeding:no   [id number]

## 2016-10-11 NOTE — Telephone Encounter (Signed)
Does not appear to have one, can you schedule her a post op appt?

## 2016-10-11 NOTE — Brief Op Note (Signed)
10/11/2016  1:20 PM  PATIENT:  [name]  51 y.o. female  PRE-OPERATIVE DIAGNOSIS:  right rotator cuff tear  POST-OPERATIVE DIAGNOSIS:  Right rotator cuff tear involving the subscapularis   PROCEDURE:  Procedure(s): ARTHROSCOPIC ROTATOR CUFF REPAIR (Right) EXAM UNDER ANESTHESIA RIGHT SHOULDER  OPERATIVE FINDINGS The rotator cuff supraspinatus infraspinatus was completely intact. There was some degenerative tearing of the superior leading edge of the subscapularis Biceps anchor intact, biceps tendon clean throughout the biceps tendon sheath Glenohumeral surfaces intact Subacromial space without any   bursitis, abrasions on the superior surface of the rotator cuff without tear   SURGEON:  Surgeon(s) and Role:    * Vickki HearingHarrison, Stanley E, MD - Primary  PHYSICIAN ASSISTANT:   ASSISTANTS: [place] [employer] Ashley   ANESTHESIA:   general  EBL:  Total I/O In: 1000 [I.V.:1000] Out: -   BLOOD ADMINISTERED:none  DRAINS: none   LOCAL MEDICATIONS USED:  MARCAINE     SPECIMEN:  No Specimen  DISPOSITION OF SPECIMEN:  N/A  COUNTS:  YES  TOURNIQUET:  * No tourniquets in log *  DICTATION: .Dragon Dictation  The patient was reevaluated in the preop area and the surgical site was confirmed as right shoulder and marked. Chart review completed.  She was taken to the operating room and general anesthesia was administered without complication. She was placed in modified beachchair position on a Schlein positioner and sterile prep and drape were completed  Timeout was executed completed  I examined the shoulder and anesthesia and under anesthesia there was no stiffness as noted preop in the office she had complete external rotation abduction forward elevation and abduction external rotation and internal rotation  This was followed by: Posterior portal was established the scope was placed in the joint thoroughly evaluation glenohumeral joint and good visualization revealed clearly intact  rotator cuff on the articular surface side and I was able to be all the way back to the infraspinatus  The glenoid labrum looked intact glenoid axilla was normal the humeral head was normal. Biceps tendon anchor and sheath were normal.  The subscapularis leading edge had some degenerative fraying. We made 2 anterior portals and debrided that.  I then placed a scope and subacromial space which was clean. The rotator cuff was evaluated by internally and externally rotating the arm in abduction and we said we found some abrasions over the cuff but there was no tear  She had a type I acromion so no decompression was needed  I washed the joint and irrigated thoroughly and then injected Marcaine with epinephrine  A closed portals with 3-0 nylon suture  Sterile dressings were applied PLAN OF CARE: Discharge to home after PACU  PATIENT DISPOSITION:  PACU - hemodynamically stable.   Delay start of Pharmacological VTE agent (>24hrs) due to surgical blood loss or risk of bleeding:no

## 2016-10-12 ENCOUNTER — Telehealth: Payer: Self-pay | Admitting: Orthopedic Surgery

## 2016-10-12 ENCOUNTER — Encounter (HOSPITAL_COMMUNITY): Payer: Self-pay | Admitting: Orthopedic Surgery

## 2016-10-12 ENCOUNTER — Other Ambulatory Visit: Payer: Self-pay | Admitting: Orthopedic Surgery

## 2016-10-12 MED ORDER — TRAMADOL HCL 50 MG PO TABS: 50.0000 mg | ORAL_TABLET | Freq: Four times a day (QID) | ORAL | 5 refills | Status: DC | PRN

## 2016-10-12 NOTE — Telephone Encounter (Addendum)
Patient had surgery yesterday.  She was given Hydrocodone but states it's causing her migraines to flare up and also keeps her from sleeping.  She wants something else for pain.  I called and spoke to patient.  She states she has taken Percocet before.

## 2016-10-12 NOTE — Telephone Encounter (Signed)
ROUTING TO DR HARRISON TO ADVISE 

## 2016-10-12 NOTE — Telephone Encounter (Signed)
WILLING TO TRY TRAMADOL WHICH CAN BE FAXED PHARMACY [facility]

## 2016-10-12 NOTE — Telephone Encounter (Signed)
How is that going to happen ?? Is she coming here in the next 1-2 hrs and what will work ask her ???

## 2016-10-12 NOTE — Telephone Encounter (Signed)
PLEASE CALL PATIENT  

## 2016-10-23 ENCOUNTER — Ambulatory Visit (INDEPENDENT_AMBULATORY_CARE_PROVIDER_SITE_OTHER): Payer: 59 | Admitting: Orthopedic Surgery

## 2016-10-23 ENCOUNTER — Encounter: Payer: Self-pay | Admitting: Orthopedic Surgery

## 2016-10-23 DIAGNOSIS — G8929 Other chronic pain: Secondary | ICD-10-CM

## 2016-10-23 DIAGNOSIS — Z4889 Encounter for other specified surgical aftercare: Secondary | ICD-10-CM

## 2016-10-23 DIAGNOSIS — M25511 Pain in right shoulder: Secondary | ICD-10-CM

## 2016-10-24 NOTE — Progress Notes (Signed)
Postop visit status post right shoulder arthroscopy, no manipulation was necessary as the patient was completely with full range of motion under anesthesia  We did find a small tear of the superior aspect of the subscapularis which was debrided  We will start physical therapy and when she comes back we will x-ray her cervical spine

## 2016-11-13 ENCOUNTER — Encounter: Payer: Self-pay | Admitting: Orthopedic Surgery

## 2016-11-13 ENCOUNTER — Ambulatory Visit (INDEPENDENT_AMBULATORY_CARE_PROVIDER_SITE_OTHER): Payer: 59

## 2016-11-13 ENCOUNTER — Ambulatory Visit (INDEPENDENT_AMBULATORY_CARE_PROVIDER_SITE_OTHER): Payer: 59 | Admitting: Orthopedic Surgery

## 2016-11-13 DIAGNOSIS — M4802 Spinal stenosis, cervical region: Secondary | ICD-10-CM | POA: Diagnosis not present

## 2016-11-13 DIAGNOSIS — M542 Cervicalgia: Secondary | ICD-10-CM | POA: Diagnosis not present

## 2016-11-13 NOTE — Progress Notes (Signed)
Postop visit  Status post arthroscopy right shoulder  Date of surgery 10/11/2016    At the time of surgery her rotator cuff was completely intact there was some degenerative fraying of the superior edge of the subscapularis which was debrided the biceps anchor was clean glenohumeral surfaces were normal subacromial space did not show any bursitis or abrasions of the superior surface of the rotator cuff  New problem  Cervical spine pain  The patient's pain may be coming from her cervical spine so we have decided to workup for that  She is currently having pain in the base of the cervical spine radiating down into the right forearm. So far she has not had any weakness in the right hand She does have decreased range of motion in all planes of the cervical spine  Review of systems she does not have any fever or history or weight loss  Exam She is well-developed and nourished grooming and hygiene are normal she has a regular walking pattern.  She has decreased range of motion of cervical spine with tenderness  There is weakness and stiffness in the rotator cuff  The Spurling maneuver was positive and during therapy when they were working on the cervical spine she had a shocklike cool feeling run down her arm  The pulses are good the capillary refill is normal the lymph nodes are negative  This patient is 51 years old she had a right shoulder arthroscopy did not show any pathology she continues with shoulder arm and neck pain she has a positive Spurling sign she underwent physical therapy for the cervical spine her pain did not improve  She took tramadol and Tylenol hydrocodone without relief   We are sending her for a C-spine MRI to evaluate the neural elements for possible epidural injection  Encounter Diagnoses  Name Primary?  . Neck pain Yes  . Spinal stenosis in cervical region

## 2016-11-20 ENCOUNTER — Ambulatory Visit (HOSPITAL_COMMUNITY)
Admission: RE | Admit: 2016-11-20 | Discharge: 2016-11-20 | Disposition: A | Discharge status: Home / Self Care | Payer: 59 | Source: Ambulatory Visit | Attending: Orthopedic Surgery | Admitting: Orthopedic Surgery

## 2016-11-20 DIAGNOSIS — M4312 Spondylolisthesis, cervical region: Secondary | ICD-10-CM | POA: Diagnosis not present

## 2016-11-20 DIAGNOSIS — M4313 Spondylolisthesis, cervicothoracic region: Secondary | ICD-10-CM | POA: Insufficient documentation

## 2016-11-20 DIAGNOSIS — M4802 Spinal stenosis, cervical region: Secondary | ICD-10-CM | POA: Insufficient documentation

## 2016-11-20 DIAGNOSIS — M47812 Spondylosis without myelopathy or radiculopathy, cervical region: Secondary | ICD-10-CM | POA: Diagnosis not present

## 2016-11-23 ENCOUNTER — Encounter: Payer: Self-pay | Admitting: Orthopedic Surgery

## 2016-11-23 ENCOUNTER — Ambulatory Visit: Payer: 59 | Admitting: Orthopedic Surgery

## 2016-11-23 NOTE — Care Management (Deleted)
Dx spinal stenosis c spine   Referral [facility] neurosurgery

## 2016-11-29 ENCOUNTER — Other Ambulatory Visit: Payer: Self-pay | Admitting: *Deleted

## 2016-11-29 DIAGNOSIS — M4802 Spinal stenosis, cervical region: Secondary | ICD-10-CM

## 2017-02-27 ENCOUNTER — Other Ambulatory Visit (HOSPITAL_COMMUNITY): Payer: Self-pay | Admitting: Neurosurgery

## 2017-02-27 DIAGNOSIS — M542 Cervicalgia: Secondary | ICD-10-CM

## 2017-03-06 ENCOUNTER — Ambulatory Visit (HOSPITAL_COMMUNITY)
Admission: RE | Admit: 2017-03-06 | Discharge: 2017-03-06 | Disposition: A | Discharge status: Home / Self Care | Payer: 59 | Source: Ambulatory Visit | Attending: Neurosurgery | Admitting: Neurosurgery

## 2017-03-06 DIAGNOSIS — M4802 Spinal stenosis, cervical region: Secondary | ICD-10-CM | POA: Diagnosis not present

## 2017-03-06 DIAGNOSIS — M542 Cervicalgia: Secondary | ICD-10-CM | POA: Diagnosis not present

## 2017-03-06 DIAGNOSIS — M2578 Osteophyte, vertebrae: Secondary | ICD-10-CM | POA: Insufficient documentation

## 2017-03-06 MED ORDER — GADOBENATE DIMEGLUMINE 529 MG/ML IV SOLN
15.0000 mL | Freq: Once | INTRAVENOUS | Status: AC | PRN
  Administered 2017-03-06: 12 mL via INTRAVENOUS

## 2017-03-19 ENCOUNTER — Ambulatory Visit (HOSPITAL_COMMUNITY): Payer: 59 | Attending: Neurosurgery

## 2017-03-19 DIAGNOSIS — M6281 Muscle weakness (generalized): Secondary | ICD-10-CM | POA: Insufficient documentation

## 2017-03-19 DIAGNOSIS — M5412 Radiculopathy, cervical region: Secondary | ICD-10-CM | POA: Insufficient documentation

## 2017-03-19 DIAGNOSIS — M2560 Stiffness of unspecified joint, not elsewhere classified: Secondary | ICD-10-CM | POA: Insufficient documentation

## 2017-03-19 DIAGNOSIS — M542 Cervicalgia: Secondary | ICD-10-CM | POA: Insufficient documentation

## 2017-03-21 ENCOUNTER — Encounter (HOSPITAL_COMMUNITY): Payer: Self-pay

## 2017-03-21 ENCOUNTER — Ambulatory Visit (HOSPITAL_COMMUNITY): Payer: 59

## 2017-03-21 DIAGNOSIS — M6281 Muscle weakness (generalized): Secondary | ICD-10-CM

## 2017-03-21 DIAGNOSIS — M542 Cervicalgia: Secondary | ICD-10-CM | POA: Diagnosis present

## 2017-03-21 DIAGNOSIS — M5412 Radiculopathy, cervical region: Secondary | ICD-10-CM | POA: Diagnosis present

## 2017-03-21 DIAGNOSIS — M2560 Stiffness of unspecified joint, not elsewhere classified: Secondary | ICD-10-CM | POA: Diagnosis present

## 2017-03-21 NOTE — Therapy (Signed)
[facility] [address] [place], [place], [address] Phone: [phone]   Fax:  [phone]  Physical Therapy Evaluation  Patient Details  Name: [name] MRN: [id number] Date of Birth: 09-13-1965 Referring Provider: Donalee Citrin   Encounter Date: 03/21/2017  PT End of Session - 03/21/17 1637    Visit Number  1    Number of Visits  9    Date for PT Re-Evaluation  04/04/17    Authorization Type  [employer] NAP    Authorization Time Period  03/21/17-04/19/16    PT Start Time  1425    PT Stop Time  1510    PT Time Calculation (min)  45 min    Activity Tolerance  Patient limited by pain    Behavior During Therapy  [facility] for tasks assessed/performed       Past Medical History:  Diagnosis Date  . Anxiety   . Asthma   . Cluster headaches   . COPD (chronic obstructive pulmonary disease) (HCC)   . COPD (chronic obstructive pulmonary disease) with chronic bronchitis (HCC)   . GERD (gastroesophageal reflux disease)   . Hyperlipemia   . Hypoglycemia   . Hypothyroidism   . Migraine   . Pneumothorax   . Rectal discharge 07/28/2010  . Shortness of breath   . Sleep apnea    cannot tolerate-not using CPAP  . Sluggishness 07/28/2010    Past Surgical History:  Procedure Laterality Date  . ABDOMINAL HYSTERECTOMY     with bladder suspension  . CHOLECYSTECTOMY    . COLONOSCOPY N/A 07/30/2013   Procedure: COLONOSCOPY;  Surgeon: Malissa Hippo, MD;  Location: [facility];  Service: Endoscopy;  Laterality: N/A;  930  . EXAM UNDER ANESTHESIA WITH MANIPULATION OF SHOULDER Right 10/11/2016   Procedure: EXAM UNDER ANESTHESIA WITH MANIPULATION OF SHOULDER;  Surgeon: Vickki Hearing, MD;  Location: [facility] ORS;  Service: Orthopedics;  Laterality: Right;  . INCISIONAL HERNIA REPAIR N/A 07/30/2016   Procedure: HERNIA REPAIR INCISIONAL WITH MESH;  Surgeon: [name], MD;  Location: [facility] ORS;  Service: General;  Laterality: N/A;  . LOBECTOMY     right lung   . SHOULDER ARTHROSCOPY WITH ROTATOR CUFF REPAIR Right 10/11/2016   Procedure: SHOULDER ARTHROSCOPY;  Surgeon: Vickki Hearing, MD;  Location: [facility] ORS;  Service: Orthopedics;  Laterality: Right;  . TUBAL LIGATION      There were no vitals filed for this visit.   Subjective Assessment - 03/21/17 1429    Subjective  September 2018 cervical ACDF C4-C7 and was feeling better until about a month ago and she started having pain again. MRI presents with bone spur and stenosis that was not resolved with the surgery. Pt has history of Rotator cuff repair on the Rt. shoulder Juley 2018. Pt notes tinglint and numbness from elbow into palm of her right hand that is intermittent depedning on what activity she is doing.     Pertinent History  Rt shoulder surgery 10/2016, cervical ACDF September 2018    Limitations  Sitting;Reading;Writing;House hold activities    How long can you sit comfortably?  10 minutes symptoms start this session     How long can you stand comfortably?  60 minutes    How long can you walk comfortably?  60 minutes     Patient Stated Goals  To go back to work without pain and tinling into hand - she reports losing her previous position     Currently in Pain?  Yes    Pain Score  6     Pain Location  Neck    Pain Orientation  Right    Pain Descriptors / Indicators  Burning;Aching;Tingling    Pain Type  Surgical pain;Acute pain    Pain Radiating Towards  Rt. arm into palm of hand - increases with washing dishes, occasionally sitting with poor posture     Pain Onset  More than a month ago    Pain Frequency  Intermittent    Aggravating Factors   ADLs, anything with functional UE use of the Rt. hand     Pain Relieving Factors  nothing seems to help          [facility] PT Assessment - 03/21/17 0001      Assessment   Medical Diagnosis  Cervicalgia with Radiculopathy    Referring Provider  Donalee Citrin    Onset Date/Surgical Date  12/19/16    Hand Dominance  Right    Next MD  Visit  03/29/2017    Prior Therapy  not after surgery, prior to for shoulder      Precautions   Precautions  None      Restrictions   Weight Bearing Restrictions  No      Balance Screen   Has the patient fallen in the past 6 months  Yes    How many times?  1    Has the patient had a decrease in activity level because of a fear of falling?   No    Is the patient reluctant to leave their home because of a fear of falling?   No      Observation/Other Assessments   Focus on Therapeutic Outcomes (FOTO)   60% limitation      Posture/Postural Control   Posture/Postural Control  Postural limitations    Postural Limitations  Forward head;Rounded Shoulders;Increased thoracic kyphosis    Posture Comments  pt unable to maintain good postural alignement >1 minute self correction noted      AROM   Cervical Flexion  40 stretch    Cervical Extension  25 pain posterior    Cervical - Right Side Bend  30    Cervical - Left Side Bend  30    Cervical - Right Rotation  45    Cervical - Left Rotation  50      Strength   Overall Strength Comments  handgrip Lt 40#, Rt 20#    Right/Left Shoulder  Right;Left    Right Shoulder Flexion  4+/5    Right Shoulder Extension  5/5    Right Shoulder ABduction  4+/5    Right Shoulder Internal Rotation  4+/5    Right Shoulder External Rotation  4+/5    Left Shoulder Flexion  5/5    Left Shoulder Extension  5/5    Left Shoulder ABduction  5/5    Left Shoulder Internal Rotation  5/5    Left Shoulder External Rotation  5/5    Cervical Flexion  4+/5    Cervical Extension  4+/5    Cervical - Right Side Bend  4/5    Cervical - Left Side Bend  4/5    Cervical - Right Rotation  4-/5    Cervical - Left Rotation  4-/5      Palpation   Spinal mobility  Unable to assess secondary to patient guarding, empty end-feel     Palpation comment  significant tenderness Rt. scapular border with moderate pressure until mid mm belly light  pressure tolerated only, increased mm  tension throughout upper traps bilaterally with tenderness noted Rt side, light pressure tolerated over suboccipitals       Special Tests    Special Tests  Cervical    Cervical Tests  Dictraction;Spurling's      Spurling's   Findings  Positive    Comment  minimal pressure      Distraction Test   Findngs  Positive    Comment  discomfort noted with light pressure             Objective measurements completed on examination: See above findings.              PT Education - 03/21/17 1606    Education provided  Yes    Education Details  Examination findings, POC, and HEP; benefits of STM and neural glides for current symptoms presentation.     Person(s) Educated  Patient    Methods  Explanation;Demonstration;Handout    Comprehension  Verbalized understanding       PT Short Term Goals - 03/21/17 1802      PT SHORT TERM GOAL #1   Title  Patient will report independence and compliance with HEP to improve cervical mobility and overall strength to improve pain.     Time  2    Period  Weeks    Status  New    Target Date  04/04/17      PT SHORT TERM GOAL #2   Title  Patient will have improve cervical mobility to be able to improve safety while driving to be able to see her blind spot with no pain > 4/10.    Time  2    Period  Weeks    Status  New      PT SHORT TERM GOAL #3   Title  Pt will have improved Rt UE strength and cervical strength by at least 1/2 grade MMT testing to improve stabilization with ADLs.     Time  2    Period  Weeks    Status  New        PT Long Term Goals - 03/21/17 1805      PT LONG TERM GOAL #1   Title  Patient will have improved strength by 1 grade utilize MMT testing to improve overall support of cervical spine and decrease pain.     Time  4    Period  Weeks    Status  New    Target Date  04/18/17      PT LONG TERM GOAL #2   Title  Patient will have improved functional outcome measure score to at least 44% to indicate a  perception of improved function with ADLs.     Time  4    Period  Weeks    Status  New      PT LONG TERM GOAL #3   Title  Patient will have improve cervical mobility to be able to improve safety while driving to be able to see her blind spot with no pain > 2/10.    Time  4    Period  Weeks    Status  New      PT LONG TERM GOAL #4   Title  Pt will have improved hand grip with at most 10# difference between Rt. and Lt to demonstrate improvement in strength.     Time  4    Period  Weeks    Status  New  Plan - 03/21/17 1639    Clinical Impression Statement  Patient is a pleasant 51 year old woman presenting to OPPT secondary to cervical anterior discectomy and fusion surgery in September 2018. Patient presents with limited cervical mobility A/P ROM, slight decreased Rt. UE strength, decreased cervical strength, decreased Rt. Hand grip strength and parathesias into Rt. Hand. Patient had positive median nerve tension testing seated with irritation at elbow. Patient presents with postural dysfunctions with ability to self-correct, however, is unable to maintain within good postural alignment. Patient has increased sensitivity to manual pressure over suboccipital musculature and along right medial scapular border utilizing light pressure. She will benefit from OP skilled PT to address the above mentioned impairments to improve overall pain limiting daily functional mobility with ADLs.     History and Personal Factors relevant to plan of care:  Surgery Rt shoulder 10/2016, cervical 12/2016    Clinical Presentation  Evolving    Clinical Decision Making  Low    PT Frequency  2x / week    PT Duration  4 weeks    PT Treatment/Interventions  ADLs/Self Care Home Management;Electrical Stimulation;Moist Heat;Therapeutic exercise;Manual techniques;Patient/family education;Neuromuscular re-education;Energy conservation;Passive range of motion;Functional mobility training    PT Next Visit Plan   Review initial evaluation and goals; focus on posture and manual initially. Initiate neural glides Rt. UE. w backs, wall thoracic mobility     PT Home Exercise Plan  12/13: AROM cerivcal spine seated, chin retraction, rows therband    Consulted and Agree with Plan of Care  Patient       Patient will benefit from skilled therapeutic intervention in order to improve the following deficits and impairments:  Decreased activity tolerance, Decreased range of motion, Decreased strength, Increased muscle spasms, Impaired UE functional use, Postural dysfunction, Pain  Visit Diagnosis: Cervicalgia  Radiculopathy, cervical region  Muscle weakness (generalized)  Decreased range of motion     Problem List Patient Active Problem List   Diagnosis Date Noted  . Partial tear of right subscapularis tendon   . Incisional hernia, without obstruction or gangrene   . Urinary frequency 03/21/2012  . Migraine equivalent syndrome 03/21/2012  . Insomnia 03/21/2012   Candise CheSiona Jule Whitsel PT, DPT 6:09 PM, 03/21/17 [phone]  [facility] [address] [place], [place], [address] Phone: [phone]   Fax:  [phone]  Name: Loreta AveCatherina W Klaiber MRN: [id number] Date of Birth: 11/25/1965

## 2017-03-21 NOTE — Patient Instructions (Signed)
   RETRACTION / CHIN TUCK Slowly draw your head back so that your ears line up with your shoulders.   10 times, 3 second hold  CERVICAL ROTATION Turn your head towards the side, then return back to looking straight ahead. 10 times, 3 second hold  CERVICAL SIDE BEND Tilt your head towards the side, then return back to looking straight ahead.  (Be sure to keep you eyes and nose pointed straight ahead the entire time)   10 times, 3 second hold   SCAPULAR RETRACTIONS Draw your shoulder blades back and down.   10 times, 5 second hold   ELASTIC BAND ROWS  Holding elastic band with both hands, draw back the band as you bend your elbows. Keep your elbows near the side of your body.   10 times, 5 second hold   ELASTIC BAND BILATERAL HORIZONTAL ABDUCTION While holding an elastic band with your elbows straight and in front of your body, pull your arms apart and towards the side.   10 times, 5 second hold Currently hold secondary to pain

## 2017-03-25 ENCOUNTER — Other Ambulatory Visit: Payer: Self-pay

## 2017-03-25 ENCOUNTER — Ambulatory Visit (HOSPITAL_COMMUNITY): Payer: 59 | Admitting: Physical Therapy

## 2017-03-25 ENCOUNTER — Encounter (HOSPITAL_COMMUNITY): Payer: Self-pay | Admitting: Physical Therapy

## 2017-03-25 DIAGNOSIS — M542 Cervicalgia: Secondary | ICD-10-CM

## 2017-03-25 DIAGNOSIS — M2560 Stiffness of unspecified joint, not elsewhere classified: Secondary | ICD-10-CM

## 2017-03-25 DIAGNOSIS — M6281 Muscle weakness (generalized): Secondary | ICD-10-CM

## 2017-03-25 DIAGNOSIS — M5412 Radiculopathy, cervical region: Secondary | ICD-10-CM

## 2017-03-25 NOTE — Therapy (Addendum)
[facility] [address] [place], [place], [address] Phone: [phone]   Fax:  [phone]  Physical Therapy Treatment / Discharge Summary  Patient Details  Name: [name] MRN: [id number] Date of Birth: 30-Apr-1965 Referring Provider: Kary Kos   Encounter Date: 03/25/2017  PT End of Session - 03/25/17 1049    Visit Number  2    Number of Visits  9    Date for PT Re-Evaluation  04/04/17    Authorization Type  Aetna NAP    Authorization Time Period  03/21/17-04/19/16    PT Start Time  0902    PT Stop Time  0946    PT Time Calculation (min)  44 min    Activity Tolerance  Patient limited by pain    Behavior During Therapy  [facility] for tasks assessed/performed       Past Medical History:  Diagnosis Date  . Anxiety   . Asthma   . Cluster headaches   . COPD (chronic obstructive pulmonary disease) ([place])   . COPD (chronic obstructive pulmonary disease) with chronic bronchitis ([place])   . GERD (gastroesophageal reflux disease)   . Hyperlipemia   . Hypoglycemia   . Hypothyroidism   . Migraine   . Pneumothorax   . Rectal discharge 07/28/2010  . Shortness of breath   . Sleep apnea    cannot tolerate-not using CPAP  . Sluggishness 07/28/2010    Past Surgical History:  Procedure Laterality Date  . ABDOMINAL HYSTERECTOMY     with bladder suspension  . CHOLECYSTECTOMY    . COLONOSCOPY N/A 07/30/2013   Procedure: COLONOSCOPY;  Surgeon: Rogene Houston, MD;  Location: [facility];  Service: Endoscopy;  Laterality: N/A;  930  . EXAM UNDER ANESTHESIA WITH MANIPULATION OF SHOULDER Right 10/11/2016   Procedure: EXAM UNDER ANESTHESIA WITH MANIPULATION OF SHOULDER;  Surgeon: Carole Civil, MD;  Location: [facility] ORS;  Service: Orthopedics;  Laterality: Right;  . INCISIONAL HERNIA REPAIR N/A 07/30/2016   Procedure: HERNIA REPAIR INCISIONAL WITH MESH;  Surgeon: Aviva Signs, MD;  Location: [facility] ORS;  Service: General;  Laterality: N/A;  .  LOBECTOMY     right lung   . SHOULDER ARTHROSCOPY WITH ROTATOR CUFF REPAIR Right 10/11/2016   Procedure: SHOULDER ARTHROSCOPY;  Surgeon: Carole Civil, MD;  Location: [facility] ORS;  Service: Orthopedics;  Laterality: Right;  . TUBAL LIGATION      There were no vitals filed for this visit.  Subjective Assessment - 03/25/17 1043    Subjective  Patient reports 6/10 neck pain this session. Patient expressed frustration at having pain while performing her household activities such as doing dishes, vacuuming, or using the computer. Patient reported that she will be going to have a nerve study done and that her physician may be considering surgery.     Pertinent History  Rt shoulder surgery 10/2016, cervical ACDF September 2018    Limitations  Sitting;Reading;Writing;House hold activities    How long can you sit comfortably?  10 minutes symptoms start this session     How long can you stand comfortably?  60 minutes    How long can you walk comfortably?  60 minutes     Patient Stated Goals  To go back to work without pain and tingling into hand - she reports losing her previous position     Currently in Pain?  Yes    Pain Score  6     Pain Location  Neck  Pain Orientation  Right    Pain Descriptors / Indicators  Burning    Pain Radiating Towards  Patient states the pain radiates down her right upper extremity into her hand    Pain Onset  More than a month ago                      [facility] Adult PT Treatment/Exercise - 03/25/17 0001      Neck Exercises: Standing   Other Standing Exercises  Wall slide with lift off x10       Neck Exercises: Supine   Capital Flexion  10 secs    Capital Flexion Limitations  supine with 1 inch lift x 2 second hold      Manual Therapy   Manual Therapy  Joint mobilization;Soft tissue mobilization;Manual Traction    Manual therapy comments  Completed separately from rest of therapy    Joint Mobilization  Grade I joint down glides at C3-C5 for pain  relief  Patient consulted for comfortable pressure    Soft tissue mobilization  Soft tissue massage to the suboccipitals; upper trapezius and levator scapulae bilaterally    Manual Traction  Patient supine with neck supported; light cervical traction for relaxation and pain relief 10x 10 seconds    Neural Stretch  Patient performed a median nerve glide x 10 on the right upper extremity with head neutral and wrist flexion with 3 second holds      Neck Exercises: Stretches   Upper Trapezius Stretch  4 reps 15 seconds with demonstration    Corner Stretch  30 seconds;3 reps    Chest Stretch  30 seconds;3 reps Pectoralis stretch and latissimus stretch supine    Lower Cervical/Upper Thoracic Stretch  30 seconds;3 reps    Other Neck Stretches  Scalene stretch with chin tuck; seated              PT Education - 03/25/17 1047    Education provided  Yes    Education Details  Reviewed patient evaluation this session. Educated the patient about the purpose of each exercise and activity throughout the therapy session. Discussed with patient her current home exercise program, did not add any new exercises this session.    Person(s) Educated  Patient    Methods  Explanation;Demonstration;Verbal cues    Comprehension  Verbalized understanding       PT Short Term Goals - 03/21/17 1802      PT SHORT TERM GOAL #1   Title  Patient will report independence and compliance with HEP to improve cervical mobility and overall strength to improve pain.     Time  2    Period  Weeks    Status  New    Target Date  04/04/17      PT SHORT TERM GOAL #2   Title  Patient will have improve cervical mobility to be able to improve safety while driving to be able to see her blind spot with no pain > 4/10.    Time  2    Period  Weeks    Status  New      PT SHORT TERM GOAL #3   Title  Pt will have improved Rt UE strength and cervical strength by at least 1/2 grade MMT testing to improve stabilization with ADLs.      Time  2    Period  Weeks    Status  New        PT Long Term Goals -  03/21/17 1805      PT LONG TERM GOAL #1   Title  Patient will have improved strength by 1 grade utilize MMT testing to improve overall support of cervical spine and decrease pain.     Time  4    Period  Weeks    Status  New    Target Date  04/18/17      PT LONG TERM GOAL #2   Title  Patient will have improved functional outcome measure score to at least 44% to indicate a perception of improved function with ADLs.     Time  4    Period  Weeks    Status  New      PT LONG TERM GOAL #3   Title  Patient will have improve cervical mobility to be able to improve safety while driving to be able to see her blind spot with no pain > 2/10.    Time  4    Period  Weeks    Status  New      PT LONG TERM GOAL #4   Title  Pt will have improved hand grip with at most 10# difference between Rt. and Lt to demonstrate improvement in strength.     Time  4    Period  Weeks    Status  New            Plan - 03/25/17 1049    Clinical Impression Statement  As patient states that her pain level is at a 6/10 this session, the focus of the session was on pain reduction and relaxation. Began the session with reviewing the patient's evaluation and goals for therapy. Patient performed seated cervical stretches in order to improve cervical mobility. Included chin tucks with scalene stretch in sitting to promote proper postural alignment. Continued with standing stretches with the corner stretch and wall slide with lift off to improve mobility as well as supine stretches for pectoralis and latissimus dorsi, patient states that with the pectoralis stretch she can feel a pull in her right arm, and patient was educated to only push to a comfortable stretch. Patient was then educated on performing median nerve glides on the right upper extremity using the wrist as the moving joint x 10. Patient performed cervical strengthening in supine with  capital flexion chin tucks with a 1 inch lift off for flexor endurance x 10. Ended session with manual therapy with light pressure soft tissue mobilization to suboccipitals, upper trapezius, and levator scapulae, manual cervical traction, and grade I joint mobilization down glides to the cervical spine for pain relief. Plan to continue with manual therapy and stretches to promote relaxation and reduce pain, integrating strengthening.     Rehab Potential  Good    PT Frequency  2x / week    PT Duration  4 weeks    PT Treatment/Interventions  ADLs/Self Care Home Management;Electrical Stimulation;Moist Heat;Therapeutic exercise;Manual techniques;Patient/family education;Neuromuscular re-education;Energy conservation;Passive range of motion;Functional mobility training    PT Next Visit Plan  continue to focus focus on posture and manual initially. Initiate neural glides Rt. UE. w backs, wall thoracic mobility     PT Home Exercise Plan  12/13: AROM cerivcal spine seated, chin retraction, rows therband    Consulted and Agree with Plan of Care  Patient       Patient will benefit from skilled therapeutic intervention in order to improve the following deficits and impairments:  Decreased activity tolerance, Decreased range of motion, Decreased strength, Increased muscle  spasms, Impaired UE functional use, Postural dysfunction, Pain  Visit Diagnosis: Cervicalgia  Radiculopathy, cervical region  Muscle weakness (generalized)  Decreased range of motion     Problem List Patient Active Problem List   Diagnosis Date Noted  . Partial tear of right subscapularis tendon   . Incisional hernia, without obstruction or gangrene   . Urinary frequency 03/21/2012  . Migraine equivalent syndrome 03/21/2012  . Insomnia 03/21/2012    PHYSICAL THERAPY DISCHARGE SUMMARY  Visits from Start of Care: 2  Current functional level related to goals / functional outcomes: See above, although unable to fully assess  as patient did not return for a re-assessment   Remaining deficits: See above, although unable to fully assess as patient did not return for a re-assessment   Education / Equipment: HEP was provided at initial evaluation, however patient did not return after second visit for further education Plan: Patient agrees to discharge.  Patient goals were not met.unable to fully assess goals as patient did not return for re-assessment Patient is being discharged due to not returning since the last visit.  ?????     Clarene Critchley PT, DPT 11:28 AM, 06/04/17 [place], [place], [address] Phone: [phone]   Fax:  [phone]  Name: [name] MRN: [id number] Date of Birth: April 03, 1966

## 2017-03-27 ENCOUNTER — Ambulatory Visit (HOSPITAL_COMMUNITY): Payer: 59 | Admitting: Physical Therapy

## 2017-03-27 ENCOUNTER — Telehealth (HOSPITAL_COMMUNITY): Payer: Self-pay | Admitting: Internal Medicine

## 2017-03-27 NOTE — Telephone Encounter (Signed)
03/27/17  patient called and said that she woke up with some serious stomach issues

## 2017-03-29 ENCOUNTER — Ambulatory Visit (HOSPITAL_COMMUNITY)
Admission: RE | Admit: 2017-03-29 | Discharge: 2017-03-29 | Disposition: A | Discharge status: Home / Self Care | Payer: 59 | Source: Ambulatory Visit | Attending: Neurosurgery | Admitting: Neurosurgery

## 2017-03-29 ENCOUNTER — Other Ambulatory Visit: Payer: Self-pay | Admitting: Neurosurgery

## 2017-03-29 DIAGNOSIS — Z981 Arthrodesis status: Secondary | ICD-10-CM | POA: Insufficient documentation

## 2017-03-29 DIAGNOSIS — M5412 Radiculopathy, cervical region: Secondary | ICD-10-CM | POA: Diagnosis not present

## 2017-03-29 DIAGNOSIS — M4313 Spondylolisthesis, cervicothoracic region: Secondary | ICD-10-CM | POA: Insufficient documentation

## 2017-04-01 ENCOUNTER — Telehealth (HOSPITAL_COMMUNITY): Payer: Self-pay | Admitting: Internal Medicine

## 2017-04-01 ENCOUNTER — Other Ambulatory Visit (HOSPITAL_COMMUNITY): Payer: Self-pay | Admitting: Neurosurgery

## 2017-04-01 DIAGNOSIS — M5412 Radiculopathy, cervical region: Secondary | ICD-10-CM

## 2017-04-01 NOTE — Telephone Encounter (Signed)
04/01/17  pt left a message to cx said she saw Dr. Wynetta Emeryram and will be having surgery on the 28th

## 2017-04-04 ENCOUNTER — Other Ambulatory Visit: Payer: Self-pay

## 2017-04-04 ENCOUNTER — Encounter (HOSPITAL_COMMUNITY): Payer: Self-pay | Admitting: *Deleted

## 2017-04-04 ENCOUNTER — Encounter (HOSPITAL_COMMUNITY): Payer: 59

## 2017-04-04 NOTE — Progress Notes (Signed)
Pt denies having recent labs. Pt denies having a chest x ray within the last year.

## 2017-04-04 NOTE — Progress Notes (Addendum)
Pt denies any acute cardiopulmonary issues. Pt denies being under the care of a cardiologist. Pt denies having a stress test and cardiac cath. Pt stated that her doctor prescribed Ativan 0.5 mg  to be taken 2 (two) times daily as needed for anxiety or sleep. Pt made aware to stop taking Aspirin ( allergic), vitamins, fish oil  and herbal medications. Do not take any NSAIDs ie: Ibuprofen, Advil, Naproxen (Aleve), Motrin, Excedrin, BC and Goody Powder. Pt verbalized understanding of all pre-op instructions.

## 2017-04-05 ENCOUNTER — Ambulatory Visit (HOSPITAL_COMMUNITY): Payer: No Typology Code available for payment source | Admitting: Critical Care Medicine

## 2017-04-05 ENCOUNTER — Encounter (HOSPITAL_COMMUNITY)
Admission: RE | Disposition: A | Discharge status: Home / Self Care | Payer: Self-pay | Source: Ambulatory Visit | Attending: Neurosurgery

## 2017-04-05 ENCOUNTER — Ambulatory Visit (HOSPITAL_COMMUNITY)
Admission: RE | Admit: 2017-04-05 | Discharge: 2017-04-05 | Disposition: A | Discharge status: Home / Self Care | Payer: No Typology Code available for payment source | Source: Ambulatory Visit | Attending: Neurosurgery | Admitting: Neurosurgery

## 2017-04-05 ENCOUNTER — Encounter (HOSPITAL_COMMUNITY): Payer: Self-pay

## 2017-04-05 ENCOUNTER — Ambulatory Visit (HOSPITAL_COMMUNITY): Payer: No Typology Code available for payment source

## 2017-04-05 DIAGNOSIS — E785 Hyperlipidemia, unspecified: Secondary | ICD-10-CM | POA: Diagnosis not present

## 2017-04-05 DIAGNOSIS — J449 Chronic obstructive pulmonary disease, unspecified: Secondary | ICD-10-CM | POA: Diagnosis not present

## 2017-04-05 DIAGNOSIS — Z886 Allergy status to analgesic agent status: Secondary | ICD-10-CM | POA: Insufficient documentation

## 2017-04-05 DIAGNOSIS — E039 Hypothyroidism, unspecified: Secondary | ICD-10-CM | POA: Diagnosis not present

## 2017-04-05 DIAGNOSIS — M4802 Spinal stenosis, cervical region: Secondary | ICD-10-CM | POA: Insufficient documentation

## 2017-04-05 DIAGNOSIS — K219 Gastro-esophageal reflux disease without esophagitis: Secondary | ICD-10-CM | POA: Insufficient documentation

## 2017-04-05 DIAGNOSIS — G4730 Sleep apnea, unspecified: Secondary | ICD-10-CM | POA: Diagnosis not present

## 2017-04-05 DIAGNOSIS — Z880 Allergy status to penicillin: Secondary | ICD-10-CM | POA: Diagnosis not present

## 2017-04-05 DIAGNOSIS — Z885 Allergy status to narcotic agent status: Secondary | ICD-10-CM | POA: Diagnosis not present

## 2017-04-05 DIAGNOSIS — Z90710 Acquired absence of both cervix and uterus: Secondary | ICD-10-CM | POA: Insufficient documentation

## 2017-04-05 DIAGNOSIS — Z87891 Personal history of nicotine dependence: Secondary | ICD-10-CM | POA: Insufficient documentation

## 2017-04-05 DIAGNOSIS — M4722 Other spondylosis with radiculopathy, cervical region: Secondary | ICD-10-CM | POA: Diagnosis not present

## 2017-04-05 DIAGNOSIS — F419 Anxiety disorder, unspecified: Secondary | ICD-10-CM | POA: Insufficient documentation

## 2017-04-05 DIAGNOSIS — Z419 Encounter for procedure for purposes other than remedying health state, unspecified: Secondary | ICD-10-CM

## 2017-04-05 DIAGNOSIS — Z79899 Other long term (current) drug therapy: Secondary | ICD-10-CM | POA: Diagnosis not present

## 2017-04-05 HISTORY — PX: POSTERIOR CERVICAL LAMINECTOMY: SHX2248

## 2017-04-05 HISTORY — DX: Radiculopathy, cervical region: M54.12

## 2017-04-05 LAB — BASIC METABOLIC PANEL
Anion gap: 8 (ref 5–15)
BUN: 11 mg/dL (ref 6–20)
CO2: 26 mmol/L (ref 22–32)
Calcium: 9.1 mg/dL (ref 8.9–10.3)
Chloride: 105 mmol/L (ref 101–111)
Creatinine, Ser: 0.89 mg/dL (ref 0.44–1.00)
GFR calc Af Amer: >60 mL/min (ref >60)
GFR calc non Af Amer: >60 mL/min (ref >60)
Glucose, Bld: 106 mg/dL — ABNORMAL HIGH (ref 65–99)
Potassium: 3.4 mmol/L — ABNORMAL LOW (ref 3.5–5.1)
Sodium: 139 mmol/L (ref 135–145)

## 2017-04-05 LAB — CBC
HCT: 35.1 % — ABNORMAL LOW (ref 36.0–46.0)
Hemoglobin: 11.1 g/dL — ABNORMAL LOW (ref 12.0–15.0)
MCH: 28 pg (ref 26.0–34.0)
MCHC: 31.6 g/dL (ref 30.0–36.0)
MCV: 88.6 fL (ref 78.0–100.0)
Platelets: 288 10*3/uL (ref 150–400)
RBC: 3.96 MIL/uL (ref 3.87–5.11)
RDW: 13.9 % (ref 11.5–15.5)
WBC: 8.5 10*3/uL (ref 4.0–10.5)

## 2017-04-05 SURGERY — POSTERIOR CERVICAL LAMINECTOMY
Anesthesia: General | Laterality: Right

## 2017-04-05 MED ORDER — ONDANSETRON HCL 4 MG/2ML IJ SOLN
INTRAMUSCULAR | Status: DC | PRN
  Administered 2017-04-05: 4 mg via INTRAVENOUS

## 2017-04-05 MED ORDER — MENTHOL 3 MG MT LOZG: 1.0000 | LOZENGE | OROMUCOSAL | Status: DC | PRN

## 2017-04-05 MED ORDER — 0.9 % SODIUM CHLORIDE (POUR BTL) OPTIME
TOPICAL | Status: DC | PRN
  Administered 2017-04-05: 1000 mL

## 2017-04-05 MED ORDER — LACTATED RINGERS IV SOLN
INTRAVENOUS | Status: DC | PRN
  Administered 2017-04-05 (×2): via INTRAVENOUS

## 2017-04-05 MED ORDER — FENTANYL CITRATE (PF) 250 MCG/5ML IJ SOLN
INTRAMUSCULAR | Status: AC
  Filled 2017-04-05: qty 5

## 2017-04-05 MED ORDER — VANCOMYCIN HCL IN DEXTROSE 1-5 GM/200ML-% IV SOLN
INTRAVENOUS | Status: AC
  Filled 2017-04-05: qty 200

## 2017-04-05 MED ORDER — LEVOTHYROXINE SODIUM 75 MCG PO TABS
75.0000 ug | ORAL_TABLET | Freq: Every day | ORAL | Status: DC
  Filled 2017-04-05: qty 1

## 2017-04-05 MED ORDER — PANTOPRAZOLE SODIUM 40 MG IV SOLR: 40.0000 mg | Freq: Every day | INTRAVENOUS | Status: DC

## 2017-04-05 MED ORDER — ACETAMINOPHEN 650 MG RE SUPP: 650.0000 mg | RECTAL | Status: DC | PRN

## 2017-04-05 MED ORDER — BACITRACIN ZINC 500 UNIT/GM EX OINT
TOPICAL_OINTMENT | CUTANEOUS | Status: AC
  Filled 2017-04-05: qty 28.35

## 2017-04-05 MED ORDER — SODIUM CHLORIDE 0.9% FLUSH: 3.0000 mL | INTRAVENOUS | Status: DC | PRN

## 2017-04-05 MED ORDER — SUGAMMADEX SODIUM 200 MG/2ML IV SOLN
INTRAVENOUS | Status: DC | PRN
  Administered 2017-04-05: 130 mg via INTRAVENOUS

## 2017-04-05 MED ORDER — CALCIUM CARBONATE-VITAMIN D 500-200 MG-UNIT PO TABS: 1.0000 | ORAL_TABLET | Freq: Every day | ORAL | Status: DC

## 2017-04-05 MED ORDER — TRIAMCINOLONE ACETONIDE 55 MCG/ACT NA AERO: 2.0000 | INHALATION_SPRAY | Freq: Every day | NASAL | Status: DC | PRN

## 2017-04-05 MED ORDER — OXYCODONE HCL 5 MG/5ML PO SOLN: 5.0000 mg | Freq: Once | ORAL | Status: DC | PRN

## 2017-04-05 MED ORDER — TRAMADOL HCL 50 MG PO TABS: 50.0000 mg | ORAL_TABLET | Freq: Four times a day (QID) | ORAL | Status: DC | PRN

## 2017-04-05 MED ORDER — THROMBIN (RECOMBINANT) 5000 UNITS EX SOLR
CUTANEOUS | Status: DC | PRN
  Administered 2017-04-05: 08:00:00 via TOPICAL

## 2017-04-05 MED ORDER — ASPIRIN-ACETAMINOPHEN-CAFFEINE 250-250-65 MG PO TABS: 2.0000 | ORAL_TABLET | Freq: Every day | ORAL | Status: DC | PRN

## 2017-04-05 MED ORDER — OXYCODONE HCL 5 MG PO TABS
10.0000 mg | ORAL_TABLET | ORAL | Status: DC | PRN
  Administered 2017-04-05: 10 mg via ORAL
  Filled 2017-04-05: qty 2

## 2017-04-05 MED ORDER — LIDOCAINE-EPINEPHRINE 1 %-1:100000 IJ SOLN
INTRAMUSCULAR | Status: DC | PRN
Start: 2017-04-05 — End: 2017-04-05
  Administered 2017-04-05: 4 mL

## 2017-04-05 MED ORDER — HYDROMORPHONE HCL 1 MG/ML IJ SOLN
0.5000 mg | INTRAMUSCULAR | Status: DC | PRN
  Administered 2017-04-05 (×2): 0.5 mg via INTRAVENOUS
  Filled 2017-04-05 (×2): qty 0.5

## 2017-04-05 MED ORDER — GLYCOPYRROLATE 0.2 MG/ML IV SOSY
PREFILLED_SYRINGE | INTRAVENOUS | Status: DC | PRN
  Administered 2017-04-05: .2 mg via INTRAVENOUS

## 2017-04-05 MED ORDER — PHENOL 1.4 % MT LIQD: 1.0000 | OROMUCOSAL | Status: DC | PRN

## 2017-04-05 MED ORDER — PROPOFOL 10 MG/ML IV BOLUS
INTRAVENOUS | Status: AC
  Filled 2017-04-05: qty 40

## 2017-04-05 MED ORDER — HYDROMORPHONE HCL 1 MG/ML IJ SOLN: 0.2500 mg | INTRAMUSCULAR | Status: DC | PRN

## 2017-04-05 MED ORDER — THROMBIN (RECOMBINANT) 5000 UNITS EX SOLR
CUTANEOUS | Status: DC | PRN
  Administered 2017-04-05: 5000 [IU] via TOPICAL

## 2017-04-05 MED ORDER — SODIUM CHLORIDE 0.9 % IV SOLN
250.0000 mL | INTRAVENOUS | Status: DC
  Administered 2017-04-05: 250 mL via INTRAVENOUS

## 2017-04-05 MED ORDER — ONDANSETRON HCL 4 MG PO TABS: 4.0000 mg | ORAL_TABLET | Freq: Four times a day (QID) | ORAL | Status: DC | PRN

## 2017-04-05 MED ORDER — LIDOCAINE 2% (20 MG/ML) 5 ML SYRINGE
INTRAMUSCULAR | Status: DC | PRN
  Administered 2017-04-05: 100 mg via INTRAVENOUS

## 2017-04-05 MED ORDER — ALBUTEROL SULFATE (2.5 MG/3ML) 0.083% IN NEBU: 2.5000 mg | INHALATION_SOLUTION | RESPIRATORY_TRACT | Status: DC | PRN

## 2017-04-05 MED ORDER — VANCOMYCIN HCL IN DEXTROSE 1-5 GM/200ML-% IV SOLN: 1000.0000 mg | Freq: Once | INTRAVENOUS | Status: DC

## 2017-04-05 MED ORDER — THROMBIN (RECOMBINANT) 5000 UNITS EX SOLR
CUTANEOUS | Status: AC
  Filled 2017-04-05: qty 5000

## 2017-04-05 MED ORDER — DEXAMETHASONE SODIUM PHOSPHATE 10 MG/ML IJ SOLN
INTRAMUSCULAR | Status: DC | PRN
  Administered 2017-04-05: 10 mg via INTRAVENOUS

## 2017-04-05 MED ORDER — SODIUM CHLORIDE 0.9 % IR SOLN
Status: DC | PRN
  Administered 2017-04-05: 08:00:00

## 2017-04-05 MED ORDER — VALACYCLOVIR HCL 500 MG PO TABS: 2000.0000 mg | ORAL_TABLET | ORAL | Status: DC

## 2017-04-05 MED ORDER — ACETAMINOPHEN 325 MG PO TABS: 650.0000 mg | ORAL_TABLET | ORAL | Status: DC | PRN

## 2017-04-05 MED ORDER — PROPOFOL 10 MG/ML IV BOLUS
INTRAVENOUS | Status: DC | PRN
  Administered 2017-04-05: 50 mg via INTRAVENOUS
  Administered 2017-04-05: 100 mg via INTRAVENOUS

## 2017-04-05 MED ORDER — ALUM & MAG HYDROXIDE-SIMETH 200-200-20 MG/5ML PO SUSP: 30.0000 mL | Freq: Four times a day (QID) | ORAL | Status: DC | PRN

## 2017-04-05 MED ORDER — LORATADINE 10 MG PO TABS: 10.0000 mg | ORAL_TABLET | Freq: Every day | ORAL | Status: DC

## 2017-04-05 MED ORDER — ACETAMINOPHEN 500 MG PO TABS: 1000.0000 mg | ORAL_TABLET | Freq: Four times a day (QID) | ORAL | Status: DC | PRN

## 2017-04-05 MED ORDER — LIDOCAINE-EPINEPHRINE 1 %-1:100000 IJ SOLN
INTRAMUSCULAR | Status: AC
  Filled 2017-04-05: qty 1

## 2017-04-05 MED ORDER — ONDANSETRON HCL 4 MG/2ML IJ SOLN: 4.0000 mg | Freq: Four times a day (QID) | INTRAMUSCULAR | Status: DC | PRN

## 2017-04-05 MED ORDER — PHENYLEPHRINE 40 MCG/ML (10ML) SYRINGE FOR IV PUSH (FOR BLOOD PRESSURE SUPPORT)
PREFILLED_SYRINGE | INTRAVENOUS | Status: DC | PRN
  Administered 2017-04-05 (×2): 40 ug via INTRAVENOUS
  Administered 2017-04-05: 120 ug via INTRAVENOUS

## 2017-04-05 MED ORDER — CYCLOBENZAPRINE HCL 10 MG PO TABS: 10.0000 mg | ORAL_TABLET | Freq: Every day | ORAL | Status: DC | PRN

## 2017-04-05 MED ORDER — FENTANYL CITRATE (PF) 250 MCG/5ML IJ SOLN
INTRAMUSCULAR | Status: DC | PRN
  Administered 2017-04-05 (×2): 50 ug via INTRAVENOUS
  Administered 2017-04-05: 100 ug via INTRAVENOUS

## 2017-04-05 MED ORDER — CYCLOBENZAPRINE HCL 10 MG PO TABS: 10.0000 mg | ORAL_TABLET | Freq: Three times a day (TID) | ORAL | 0 refills | Status: DC | PRN

## 2017-04-05 MED ORDER — ROCURONIUM BROMIDE 10 MG/ML (PF) SYRINGE
PREFILLED_SYRINGE | INTRAVENOUS | Status: DC | PRN
  Administered 2017-04-05: 10 mg via INTRAVENOUS
  Administered 2017-04-05: 40 mg via INTRAVENOUS

## 2017-04-05 MED ORDER — CYCLOBENZAPRINE HCL 10 MG PO TABS
10.0000 mg | ORAL_TABLET | Freq: Three times a day (TID) | ORAL | Status: DC | PRN
  Administered 2017-04-05: 10 mg via ORAL
  Filled 2017-04-05: qty 1

## 2017-04-05 MED ORDER — SUMATRIPTAN SUCCINATE 6 MG/0.5ML ~~LOC~~ SOLN: 6.0000 mg | Freq: Every day | SUBCUTANEOUS | Status: DC | PRN

## 2017-04-05 MED ORDER — SODIUM CHLORIDE 0.9 % IV SOLN
INTRAVENOUS | Status: DC | PRN
  Administered 2017-04-05: 1000 mg via INTRAVENOUS

## 2017-04-05 MED ORDER — MOMETASONE FURO-FORMOTEROL FUM 200-5 MCG/ACT IN AERO
1.0000 | INHALATION_SPRAY | Freq: Two times a day (BID) | RESPIRATORY_TRACT | Status: DC
  Filled 2017-04-05: qty 8.8

## 2017-04-05 MED ORDER — BUPIVACAINE HCL (PF) 0.25 % IJ SOLN
INTRAMUSCULAR | Status: AC
  Filled 2017-04-05: qty 30

## 2017-04-05 MED ORDER — PHENYLEPHRINE HCL 10 MG/ML IJ SOLN
INTRAVENOUS | Status: DC | PRN
  Administered 2017-04-05: 25 ug/min via INTRAVENOUS

## 2017-04-05 MED ORDER — BISMUTH SUBSALICYLATE 262 MG PO CHEW: 524.0000 mg | CHEWABLE_TABLET | ORAL | Status: DC | PRN

## 2017-04-05 MED ORDER — BUPIVACAINE HCL (PF) 0.25 % IJ SOLN
INTRAMUSCULAR | Status: DC | PRN
  Administered 2017-04-05: 5 mL

## 2017-04-05 MED ORDER — SODIUM CHLORIDE 0.9% FLUSH
3.0000 mL | Freq: Two times a day (BID) | INTRAVENOUS | Status: DC
  Administered 2017-04-05: 3 mL via INTRAVENOUS

## 2017-04-05 MED ORDER — OXYCODONE HCL 5 MG PO TABS: 5.0000 mg | ORAL_TABLET | Freq: Once | ORAL | Status: DC | PRN

## 2017-04-05 MED ORDER — MIDAZOLAM HCL 5 MG/5ML IJ SOLN
INTRAMUSCULAR | Status: DC | PRN
  Administered 2017-04-05: 2 mg via INTRAVENOUS

## 2017-04-05 MED ORDER — PANTOPRAZOLE SODIUM 40 MG PO TBEC: 40.0000 mg | DELAYED_RELEASE_TABLET | Freq: Every day | ORAL | Status: DC

## 2017-04-05 MED ORDER — MIDAZOLAM HCL 2 MG/2ML IJ SOLN
INTRAMUSCULAR | Status: AC
  Filled 2017-04-05: qty 2

## 2017-04-05 MED ORDER — DOCUSATE SODIUM 100 MG PO CAPS: 100.0000 mg | ORAL_CAPSULE | Freq: Every day | ORAL | Status: DC | PRN

## 2017-04-05 SURGICAL SUPPLY — 64 items
ADH SKN CLS APL DERMABOND .7 (GAUZE/BANDAGES/DRESSINGS) ×1
APL SKNCLS STERI-STRIP NONHPOA (GAUZE/BANDAGES/DRESSINGS) ×1
BAG DECANTER FOR FLEXI CONT (MISCELLANEOUS) ×2 IMPLANT
BENZOIN TINCTURE PRP APPL 2/3 (GAUZE/BANDAGES/DRESSINGS) ×3 IMPLANT
BLADE CLIPPER SURG (BLADE) ×1 IMPLANT
BLADE SURG 11 STRL SS (BLADE) ×2 IMPLANT
BONE VIVIGEN FORMABLE 1.3CC (Bone Implant) ×2 IMPLANT
BUR MATCHSTICK NEURO 3.0 LAGG (BURR) ×2 IMPLANT
CANISTER SUCT 3000ML PPV (MISCELLANEOUS) ×2 IMPLANT
CARTRIDGE OIL MAESTRO DRILL (MISCELLANEOUS) ×1 IMPLANT
DECANTER SPIKE VIAL GLASS SM (MISCELLANEOUS) ×3 IMPLANT
DERMABOND ADVANCED (GAUZE/BANDAGES/DRESSINGS) ×1
DERMABOND ADVANCED .7 DNX12 (GAUZE/BANDAGES/DRESSINGS) ×1 IMPLANT
DIFFUSER DRILL AIR PNEUMATIC (MISCELLANEOUS) ×2 IMPLANT
DRAPE C-ARM 42X72 X-RAY (DRAPES) ×4 IMPLANT
DRAPE LAPAROTOMY 100X72 PEDS (DRAPES) ×2 IMPLANT
DRAPE MICROSCOPE LEICA (MISCELLANEOUS) ×1 IMPLANT
DRAPE POUCH INSTRU U-SHP 10X18 (DRAPES) ×2 IMPLANT
DRAPE SURG 17X23 STRL (DRAPES) ×2 IMPLANT
DRSG OPSITE POSTOP 4X6 (GAUZE/BANDAGES/DRESSINGS) ×1 IMPLANT
DURAPREP 26ML APPLICATOR (WOUND CARE) ×2 IMPLANT
ELECT REM PT RETURN 9FT ADLT (ELECTROSURGICAL) ×2
ELECTRODE REM PT RTRN 9FT ADLT (ELECTROSURGICAL) ×1 IMPLANT
GAUZE SPONGE 4X4 12PLY STRL (GAUZE/BANDAGES/DRESSINGS) ×2 IMPLANT
GAUZE SPONGE 4X4 16PLY XRAY LF (GAUZE/BANDAGES/DRESSINGS) IMPLANT
GLOVE BIO SURGEON STRL SZ7 (GLOVE) IMPLANT
GLOVE BIO SURGEON STRL SZ8 (GLOVE) ×2 IMPLANT
GLOVE BIOGEL PI IND STRL 7.0 (GLOVE) IMPLANT
GLOVE BIOGEL PI INDICATOR 7.0 (GLOVE)
GLOVE EXAM NITRILE LRG STRL (GLOVE) IMPLANT
GLOVE EXAM NITRILE XL STR (GLOVE) IMPLANT
GLOVE EXAM NITRILE XS STR PU (GLOVE) IMPLANT
GLOVE INDICATOR 8.5 STRL (GLOVE) ×2 IMPLANT
GOWN STRL REUS W/ TWL LRG LVL3 (GOWN DISPOSABLE) IMPLANT
GOWN STRL REUS W/ TWL XL LVL3 (GOWN DISPOSABLE) ×1 IMPLANT
GOWN STRL REUS W/TWL 2XL LVL3 (GOWN DISPOSABLE) IMPLANT
GOWN STRL REUS W/TWL LRG LVL3 (GOWN DISPOSABLE)
GOWN STRL REUS W/TWL XL LVL3 (GOWN DISPOSABLE) ×2
GRAFT BNE MATRIX VG FRMBL SM 1 (Bone Implant) IMPLANT
KIT BASIN OR (CUSTOM PROCEDURE TRAY) ×2 IMPLANT
KIT ROOM TURNOVER OR (KITS) ×2 IMPLANT
MARKER SKIN DUAL TIP RULER LAB (MISCELLANEOUS) ×2 IMPLANT
NDL HYPO 25X1 1.5 SAFETY (NEEDLE) ×1 IMPLANT
NDL SPNL 20GX3.5 QUINCKE YW (NEEDLE) ×1 IMPLANT
NEEDLE HYPO 25X1 1.5 SAFETY (NEEDLE) ×2 IMPLANT
NEEDLE SPNL 20GX3.5 QUINCKE YW (NEEDLE) ×2 IMPLANT
NS IRRIG 1000ML POUR BTL (IV SOLUTION) ×2 IMPLANT
OIL CARTRIDGE MAESTRO DRILL (MISCELLANEOUS) ×2
PACK LAMINECTOMY NEURO (CUSTOM PROCEDURE TRAY) ×2 IMPLANT
PAD ARMBOARD 7.5X6 YLW CONV (MISCELLANEOUS) ×6 IMPLANT
PIN MAYFIELD SKULL DISP (PIN) ×2 IMPLANT
RUBBERBAND STERILE (MISCELLANEOUS) ×2 IMPLANT
SPONGE LAP 4X18 X RAY DECT (DISPOSABLE) IMPLANT
SPONGE SURGIFOAM ABS GEL SZ50 (HEMOSTASIS) ×1 IMPLANT
STRIP CLOSURE SKIN 1/2X4 (GAUZE/BANDAGES/DRESSINGS) ×2 IMPLANT
SUT ETHILON 4 0 PS 2 18 (SUTURE) IMPLANT
SUT VIC AB 0 CT1 18XCR BRD8 (SUTURE) ×1 IMPLANT
SUT VIC AB 0 CT1 8-18 (SUTURE) ×2
SUT VIC AB 2-0 CT1 18 (SUTURE) ×2 IMPLANT
SUT VICRYL 4-0 PS2 18IN ABS (SUTURE) ×2 IMPLANT
TOWEL GREEN STERILE (TOWEL DISPOSABLE) ×2 IMPLANT
TOWEL GREEN STERILE FF (TOWEL DISPOSABLE) ×2 IMPLANT
TRAY FOLEY W/METER SILVER 16FR (SET/KITS/TRAYS/PACK) IMPLANT
WATER STERILE IRR 1000ML POUR (IV SOLUTION) ×2 IMPLANT

## 2017-04-05 NOTE — Anesthesia Preprocedure Evaluation (Addendum)
Anesthesia Evaluation  Patient identified by MRN, date of birth, ID band Patient awake    Reviewed: Allergy & Precautions, NPO status , Patient's Chart, lab work & pertinent test results  Airway Mallampati: III  TM Distance: <3 FB Neck ROM: Limited  Mouth opening: Limited Mouth Opening  Dental  (+) Edentulous Upper, Edentulous Lower   Pulmonary shortness of breath, asthma , COPD, former smoker,    breath sounds clear to auscultation       Cardiovascular  Rhythm:Regular Rate:Normal     Neuro/Psych  Neuromuscular disease    GI/Hepatic GERD  ,  Endo/Other  Hypothyroidism   Renal/GU      Musculoskeletal   Abdominal   Peds  Hematology   Anesthesia Other Findings   Reproductive/Obstetrics                            Anesthesia Physical Anesthesia Plan  ASA: II  Anesthesia Plan: General   Post-op Pain Management:    Induction: Intravenous  PONV Risk Score and Plan: 4 or greater and Treatment may vary due to age or medical condition, Dexamethasone and Ondansetron  Airway Management Planned: Oral ETT and Video Laryngoscope Planned  Additional Equipment:   Intra-op Plan:   Post-operative Plan: Extubation in OR  Informed Consent: I have reviewed the patients History and Physical, chart, labs and discussed the procedure including the risks, benefits and alternatives for the proposed anesthesia with the patient or authorized representative who has indicated his/her understanding and acceptance.   Dental advisory given  Plan Discussed with: CRNA  Anesthesia Plan Comments:        Anesthesia Quick Evaluation

## 2017-04-05 NOTE — Discharge Summary (Signed)
Physician Discharge Summary  Patient ID: [name] MRN: [id number] DOB/AGE: 53/05/1965 53 y.o.  Admit date: 04/05/2017 Discharge date: 04/05/2017  Admission Diagnoses: Cervical radiculopathy C7 right from cervical spondylosis C6-7  Discharge Diagnoses: Same Active Problems:   Spinal stenosis of cervical region   Discharged Condition: good  Hospital Course: Patient admitted hospital one posterior cervical foraminotomy. Postoperatively patient did very well recovered before the floor was angling and voiding spontaneously tolerating regular diet stable for discharge home. Patient will be discharged scheduled follow-up in one to 2 weeks.  Consults: Significant Diagnostic Studies: Treatments: Posterior cervical foraminotomies C7 right Discharge Exam: Blood pressure 116/79, pulse 90, temperature 97.8 F (36.6 C), resp. rate 20, height 5\' 4"  (1.626 m), weight 63.5 kg (140 lb), SpO2 99 %. Strength out of 5 wound clean dry and intact  Disposition: Home   Allergies as of 04/05/2017      Reactions   Aspirin Shortness Of Breath   Causes asthma flares    Penicillins Other (See Comments)   UNSPECIFIED REACTION OF CHILDHOOD Has patient had a PCN reaction causing immediate rash, facial/tongue/throat swelling, SOB or lightheadedness with hypotension: NO Has patient had a PCN reaction causing severe rash involving mucus membranes or skin necrosis: NO Has patient had a PCN reaction that required hospitalization: no Has patient had a PCN reaction occurring within the last 10 years: NO If all of the above answers are "NO", then may proceed with Cephalosporin use.   Eggs Or Egg-derived Products Diarrhea, Other (See Comments)   BLOATING > GAS   Vicodin [hydrocodone-acetaminophen] Other (See Comments)   Headaches       Medication List    TAKE these medications   acetaminophen 500 MG tablet Commonly known as:  TYLENOL Take 1,000 mg by mouth every 6 (six) hours as needed for  moderate pain.   aspirin-acetaminophen-caffeine 250-250-65 MG tablet Commonly known as:  EXCEDRIN MIGRAINE Take 2 tablets by mouth daily as needed for headache or migraine.   bismuth subsalicylate 262 MG chewable tablet Commonly known as:  PEPTO BISMOL Chew 524 mg by mouth as needed for indigestion.   CALCIUM 600+D3 600-800 MG-UNIT Tabs Generic drug:  Calcium Carb-Cholecalciferol Take 1 tablet by mouth daily.   cetirizine 10 MG tablet Commonly known as:  ZYRTEC Take 10 mg by mouth daily.   cyclobenzaprine 10 MG tablet Commonly known as:  FLEXERIL Take 10 mg by mouth daily as needed for muscle spasms. What changed:  Another medication with the same name was added. Make sure you understand how and when to take each.   cyclobenzaprine 10 MG tablet Commonly known as:  FLEXERIL Take 1 tablet (10 mg total) by mouth 3 (three) times daily as needed for muscle spasms. What changed:  You were already taking a medication with the same name, and this prescription was added. Make sure you understand how and when to take each.   docusate sodium 100 MG capsule Commonly known as:  COLACE Take 100 mg by mouth daily as needed for mild constipation.   DULERA 200-5 MCG/ACT Aero Generic drug:  mometasone-formoterol Inhale 1 puff into the lungs 2 (two) times daily.   GAS-X PO Take 2 tablets by mouth daily as needed (gas).   HYDROcodone-acetaminophen 5-325 MG tablet Commonly known as:  NORCO/VICODIN Take 1 tablet by mouth every 4 (four) hours as needed for moderate pain (Must last 14 days.Do not take and drive a car or use machinery.).   levothyroxine 75 MCG tablet Commonly known as:  SYNTHROID, LEVOTHROID Take 75 mcg by mouth daily before breakfast.   LORazepam 0.5 MG tablet Commonly known as:  ATIVAN Take 1 tablet (0.5 mg total) by mouth at bedtime as needed for anxiety. What changed:    when to take this  reasons to take this   NASACORT ALLERGY 24HR NA Place 1 spray into the  nose daily as needed (allergies).   pantoprazole 40 MG tablet Commonly known as:  PROTONIX Take 40 mg by mouth daily.   SUMAtriptan 6 MG/0.5ML Soaj Inject 6 mg as directed daily as needed (migraine).   traMADol 50 MG tablet Commonly known as:  ULTRAM Take 1 tablet (50 mg total) by mouth every 6 (six) hours as needed. What changed:  reasons to take this   valACYclovir 500 MG tablet Commonly known as:  VALTREX Take 2,000 mg by mouth See admin instructions. Take 2000mg s at first sign of fever blister outbreak then take 500mg s daily until gone   VENTOLIN HFA 108 (90 Base) MCG/ACT inhaler Generic drug:  albuterol Inhale 2 puffs into the lungs every 4 (four) hours as needed for shortness of breath.        Signed: Trysten Berti P 04/05/2017, 4:09 PM

## 2017-04-05 NOTE — Progress Notes (Signed)
Discharged instructions/education/AVS/Rx given to patient and verbalized understanding. Patient MAE well. Swallowing with no problem and tolerated dinner well. Ambulates in hallway with supervision. Pain is mild to moderate. No sweliing, no drainage on incision site. Patient awaits for his transport to home.

## 2017-04-05 NOTE — Transfer of Care (Signed)
Immediate Anesthesia Transfer of Care Note  Patient: [name]  Procedure(s) Performed: Right C6-7 Posterior cervical laminectomy (Right )  Patient Location: PACU  Anesthesia Type:General  Level of Consciousness: awake, alert  and oriented  Airway & Oxygen Therapy: Patient Spontanous Breathing and Patient connected to nasal cannula oxygen  Post-op Assessment: Report given to RN and Post -op Vital signs reviewed and stable  Post vital signs: Reviewed and stable  Last Vitals:  Vitals:   04/05/17 0552  BP: (!) 109/55  Pulse: 90  Resp: 20  Temp: 36.7 C  SpO2: 97%    Last Pain:  Vitals:   04/05/17 0552  TempSrc: Oral  PainSc: 6       Patients Stated Pain Goal: 5 (04/05/17 0552)  Complications: No apparent anesthesia complications

## 2017-04-05 NOTE — Anesthesia Postprocedure Evaluation (Signed)
Anesthesia Post Note  Patient: [name]  Procedure(s) Performed: Right C6-7 Posterior cervical laminectomy (Right )     Patient location during evaluation: PACU Anesthesia Type: General Level of consciousness: awake and sedated Pain management: pain level controlled Vital Signs Assessment: post-procedure vital signs reviewed and stable Respiratory status: spontaneous breathing, nonlabored ventilation, respiratory function stable and patient connected to nasal cannula oxygen Cardiovascular status: blood pressure returned to baseline and stable Postop Assessment: no apparent nausea or vomiting Anesthetic complications: no    Last Vitals:  Vitals:   04/05/17 0950 04/05/17 1010  BP: (!) 107/51 118/62  Pulse:    Resp: 18 18  Temp: 36.5 C 36.6 C  SpO2:  100%    Last Pain:  Vitals:   04/05/17 0916  TempSrc:   PainSc: Asleep                 Harsha Yusko,JAMES TERRILL

## 2017-04-05 NOTE — H&P (Signed)
[name] is an 51 y.o. female.   Chief Complaint: neck and right arm pain HPI: 51 year old female with history of neck and right arm pain underwent ACDF C4-C7 well initially however start raising worsening right C7 radicular symptoms several weeks after surgery this is been refractory to all forms of conservative treatment workup revealed persistent distal foraminal stenosis on the right C7 nerve roots patient was recommended posterior cervical laminotomy and foraminotomy at 67 on the right. We've extensively gone over the risks and benefits of the operation the patient as well as perioperative course expectations of outcome and alternatives of surgery she understands and agrees to proceed forward. Preoperative CT scan did show what appeared to be solid arthrodesissess fusion intra and may elect to lay down some additional bone graft  In the facet joints.  Past Medical History:  Diagnosis Date  . Anxiety   . Asthma   . Cervical radiculopathy   . Cluster headaches   . COPD (chronic obstructive pulmonary disease) ([place])   . COPD (chronic obstructive pulmonary disease) with chronic bronchitis ([place])   . GERD (gastroesophageal reflux disease)   . Hyperlipemia   . Hypoglycemia   . Hypothyroidism   . Migraine   . Pneumothorax   . Rectal discharge 07/28/2010  . Shortness of breath   . Sleep apnea    cannot tolerate-not using CPAP  . Sluggishness 07/28/2010    Past Surgical History:  Procedure Laterality Date  . ABDOMINAL HYSTERECTOMY     with bladder suspension  . CERVICAL FUSION    . CHOLECYSTECTOMY    . COLONOSCOPY N/A 07/30/2013   Procedure: COLONOSCOPY;  Surgeon: Rogene Houston, MD;  Location: [facility];  Service: Endoscopy;  Laterality: N/A;  930  . EXAM UNDER ANESTHESIA WITH MANIPULATION OF SHOULDER Right 10/11/2016   Procedure: EXAM UNDER ANESTHESIA WITH MANIPULATION OF SHOULDER;  Surgeon: Carole Civil, MD;  Location: [facility];  Service: Orthopedics;  Laterality:  Right;  . INCISIONAL HERNIA REPAIR N/A 07/30/2016   Procedure: HERNIA REPAIR INCISIONAL WITH MESH;  Surgeon: Aviva Signs, MD;  Location: [facility];  Service: General;  Laterality: N/A;  . LOBECTOMY     right lung   . SHOULDER ARTHROSCOPY WITH ROTATOR CUFF REPAIR Right 10/11/2016   Procedure: SHOULDER ARTHROSCOPY;  Surgeon: Carole Civil, MD;  Location: [facility];  Service: Orthopedics;  Laterality: Right;  . TUBAL LIGATION      Family History  Problem Relation Age of Onset  . COPD Mother   . Diabetes Mother   . Hyperlipidemia Mother   . Hypertension Mother   . Bipolar disorder Son   . Heart failure Maternal Grandmother   . Hypertension Maternal Grandmother   . Thyroid disease Maternal Grandmother   . Diabetes Paternal Grandmother   . Alzheimer's disease Paternal Grandmother   . Heart failure Paternal Grandfather   . Hypertension Paternal Grandfather   . Hypertension Father   . Colon cancer Neg Hx    Social History:  reports that she quit smoking about 9 years ago. Her smoking use included cigarettes. She has a 30.00 pack-year smoking history. she has never used smokeless tobacco. She reports that she does not drink alcohol or use drugs.  Allergies:  Allergies  Allergen Reactions  . Aspirin Shortness Of Breath    Causes asthma flares   . Penicillins Other (See Comments)    UNSPECIFIED REACTION OF CHILDHOOD  Has patient had a PCN reaction causing immediate rash, facial/tongue/throat swelling, SOB or lightheadedness  with hypotension: NO Has patient had a PCN reaction causing severe rash involving mucus membranes or skin necrosis: NO Has patient had a PCN reaction that required hospitalization: no Has patient had a PCN reaction occurring within the last 10 years: NO If all of the above answers are "NO", then may proceed with Cephalosporin use.   . Eggs Or Egg-Derived Products Diarrhea and Other (See Comments)    BLOATING > GAS  . Vicodin [Hydrocodone-Acetaminophen] Other (See  Comments)    Headaches     Medications Prior to Admission  Medication Sig Dispense Refill  . acetaminophen (TYLENOL) 500 MG tablet Take 1,000 mg by mouth every 6 (six) hours as needed for moderate pain.    Marland Kitchen albuterol (VENTOLIN HFA) 108 (90 BASE) MCG/ACT inhaler Inhale 2 puffs into the lungs every 4 (four) hours as needed for shortness of breath.     Marland Kitchen aspirin-acetaminophen-caffeine (EXCEDRIN MIGRAINE) 250-250-65 MG tablet Take 2 tablets by mouth daily as needed for headache or migraine.     . bismuth subsalicylate (PEPTO BISMOL) 262 MG chewable tablet Chew 524 mg by mouth as needed for indigestion.    . Calcium Carb-Cholecalciferol (CALCIUM 600+D3) 600-800 MG-UNIT TABS Take 1 tablet by mouth daily.     . cetirizine (ZYRTEC) 10 MG tablet Take 10 mg by mouth daily.    Marland Kitchen docusate sodium (COLACE) 100 MG capsule Take 100 mg by mouth daily as needed for mild constipation.    Marland Kitchen levothyroxine (SYNTHROID, LEVOTHROID) 75 MCG tablet Take 75 mcg by mouth daily before breakfast.    . LORazepam (ATIVAN) 0.5 MG tablet Take 1 tablet (0.5 mg total) by mouth at bedtime as needed for anxiety. (Patient taking differently: Take 0.5 mg by mouth 2 (two) times daily as needed for anxiety or sleep. ) 30 tablet 1  . mometasone-formoterol (DULERA) 200-5 MCG/ACT AERO Inhale 1 puff into the lungs 2 (two) times daily.    . pantoprazole (PROTONIX) 40 MG tablet Take 40 mg by mouth daily.    . Simethicone (GAS-X PO) Take 2 tablets by mouth daily as needed (gas).    . traMADol (ULTRAM) 50 MG tablet Take 1 tablet (50 mg total) by mouth every 6 (six) hours as needed. (Patient taking differently: Take 50 mg by mouth every 6 (six) hours as needed for moderate pain. ) 60 tablet 5  . Triamcinolone Acetonide (NASACORT ALLERGY 24HR NA) Place 1 spray into the nose daily as needed (allergies).    . cyclobenzaprine (FLEXERIL) 10 MG tablet Take 10 mg by mouth daily as needed for muscle spasms.  0  . HYDROcodone-acetaminophen  (NORCO/VICODIN) 5-325 MG tablet Take 1 tablet by mouth every 4 (four) hours as needed for moderate pain (Must last 14 days.Do not take and drive a car or use machinery.). (Patient not taking: Reported on 03/21/2017) 42 tablet 0  . SUMAtriptan 6 MG/0.5ML SOAJ Inject 6 mg as directed daily as needed (migraine).     . valACYclovir (VALTREX) 500 MG tablet Take 2,000 mg by mouth See admin instructions. Take 2071ms at first sign of fever blister outbreak then take 5012m daily until gone      Results for orders placed or performed during the hospital encounter of 04/05/17 (from the past 48 hour(s))  CBC     Status: Abnormal   Collection Time: 04/05/17  6:09 AM  Result Value Ref Range   WBC 8.5 4.0 - 10.5 K/uL   RBC 3.96 3.87 - 5.11 MIL/uL   Hemoglobin 11.1 (L) 12.0 -  15.0 g/dL   HCT 35.1 (L) 36.0 - 46.0 %   MCV 88.6 78.0 - 100.0 fL   MCH 28.0 26.0 - 34.0 pg   MCHC 31.6 30.0 - 36.0 g/dL   RDW 13.9 11.5 - 15.5 %   Platelets 288 150 - 400 K/uL  Basic metabolic panel     Status: Abnormal   Collection Time: 04/05/17  6:09 AM  Result Value Ref Range   Sodium 139 135 - 145 mmol/L   Potassium 3.4 (L) 3.5 - 5.1 mmol/L   Chloride 105 101 - 111 mmol/L   CO2 26 22 - 32 mmol/L   Glucose, Bld 106 (H) 65 - 99 mg/dL   BUN 11 6 - 20 mg/dL   Creatinine, Ser 0.89 0.44 - 1.00 mg/dL   Calcium 9.1 8.9 - 10.3 mg/dL   GFR calc non Af Amer >60 >60 mL/min   GFR calc Af Amer >60 >60 mL/min    Comment: (NOTE) The eGFR has been calculated using the CKD EPI equation. This calculation has not been validated in all clinical situations. eGFR's persistently <60 mL/min signify possible Chronic Kidney Disease.    Anion gap 8 5 - 15   No results found.  Review of Systems  Musculoskeletal: Positive for neck pain.  Neurological: Positive for tingling and sensory change.    Blood pressure (!) 109/55, pulse 90, temperature 98.1 F (36.7 C), temperature source Oral, resp. rate 20, height 5' 4"  (1.626 m), weight  63.5 kg (140 lb), SpO2 97 %. Physical Exam  Constitutional: She is oriented to person, place, and time. She appears well-developed and well-nourished.  HENT:  Head: Normocephalic.  Eyes: Pupils are equal, round, and reactive to light.  Neck: Normal range of motion.  GI: Soft. Bowel sounds are normal.  Neurological: She is alert and oriented to person, place, and time. She has normal strength. GCS eye subscore is 4. GCS verbal subscore is 5. GCS motor subscore is 6.  Strength 5 out 5 deltoid, bicep, tricep, wrist flexion, wrist extension, hand intrinsics.     Assessment/Plan 51 year old presents for posterior cervical laminectomy and foraminotomies.  Hailley Byers P, MD 04/05/2017, 7:08 AM

## 2017-04-05 NOTE — Op Note (Addendum)
Preoperative diagnosis: Cervical radiculopathy C7 right  Postoperative diagnosis: Same from cervical spondylosis and foraminal stenosis C6-7 right    procedure: Posterior cervical laminectomy foraminotomy C6-7 on the right with microdissection of the right C7 nerve root microscopic and foraminotomy  #2 posterior lateral arthrodesis on the right at C6-7utilizing vivgen and autograft.  Surgeon: Jillyn HiddenGary Betty Daidone  Asst.: Verlin DikeKimberly Meyran  Anesthesia: Gen.  EBL: Minimal  History of present illness: 51 year old female previously undergone ACDF from C4-C7 postoperatively after several weeks developed persistent right C7 root copy workup revealed persistent distal stenosis and the foramina of the right C7 nerve root. Due to patient's failure conservative treatment imaging findings and progressive conical syndrome I recommended posterior cervical laminectomy foraminotomy on the right at C6-7. I extensively reviewed the risks and benefits of the operation with the patient as well as perioperative course expectations of outcome and alternatives surgery and she understands and agrees to proceed forward.  Operative procedure: Patient brought into the or was induced on general anesthesia positioned prone in pins the neck in slight flexion the back side of her neck was prepped and draped in routine sterile fashion. After infiltration of 5 mL lidocaine with epi a midline incision was made and Bovie light cautery was used to take down the subcutaneous tissues and subperiosteal dissection carried lamina of C6-7 on the right confirmed by interoperative x-ray. Laminotomy was begun with a 2 mm Kerrison punch ligament flavum was removed piece of fashion then under Mike's cup illumination there was a large spur coming off the distal aspect of the facet joint pressing over the top of the dorsal aspect of the C7 nerve root this was removed in piecemeal fashion with a 1 and the Kerrison punch. I marched out the C7 foramina until  there is no further stenosis L was burred been removed the nerve root was decompressed flush with pedicle and I was easily able to pass a black nerve hook and a 7 room dissector easily out the foramen without resistance. The wounds and to see her get meticulous hemostasis was maintained I then tested the fusion by using a towel clip a gravida C6 spinous process it appeared to be fused although there was a question of slight motion so on the distal aspect of the facet joint I decorticated the distal facet as well as the lamina of C6-C7 packed the vivigen into the facet joint and along the lateral aspect of the lamina of 67 with Surgifoam and cottonoid protecting the foramina at C7. After this was all packed in the facet joint I then removed the retractor maintain meticulous in a stasis and closed the wound closed the wound in layers with interrupted Vicryl and a running 4 subcuticular and skin Dermabond benzo and Steri-Strips and a sterile dressing was applied patient recovered in stable condition. At the end the case all needle counts sponge counts were correct.

## 2017-04-05 NOTE — Progress Notes (Signed)
Orthopedic Tech Progress Note Patient Details:  [name] 06/28/1965 [id number]  Ortho Devices Type of Ortho Device: Soft collar Ortho Device/Splint Interventions: Application   Post Interventions Patient Tolerated: Well Instructions Provided: Care of device   Nikki DomCrawford, Elvenia Godden 04/05/2017, 4:19 PM

## 2017-04-05 NOTE — Discharge Instructions (Signed)

## 2017-04-05 NOTE — Anesthesia Procedure Notes (Signed)
Procedure Name: Intubation Date/Time: 04/05/2017 7:28 AM Performed by: Rachel MouldsLee, Daimen Shovlin B, CRNA Pre-anesthesia Checklist: Patient identified, Emergency Drugs available, Suction available, Patient being monitored and Timeout performed Patient Re-evaluated:Patient Re-evaluated prior to induction Oxygen Delivery Method: Circle system utilized Preoxygenation: Pre-oxygenation with 100% oxygen Induction Type: IV induction Ventilation: Mask ventilation without difficulty Grade View: Grade I Tube type: Oral Number of attempts: 1 Airway Equipment and Method: Stylet and Video-laryngoscopy Placement Confirmation: ETT inserted through vocal cords under direct vision,  positive ETCO2,  CO2 detector and breath sounds checked- equal and bilateral Secured at: 21 cm Tube secured with: Tape Dental Injury: Teeth and Oropharynx as per pre-operative assessment  Comments: glidescope utilized b/c pt has radiculopathy in right arm with neck movement

## 2017-04-08 ENCOUNTER — Encounter (HOSPITAL_COMMUNITY): Payer: Self-pay | Admitting: Neurosurgery

## 2017-04-10 ENCOUNTER — Encounter (HOSPITAL_COMMUNITY): Payer: 59

## 2017-04-12 ENCOUNTER — Encounter (HOSPITAL_COMMUNITY): Payer: 59

## 2017-04-16 ENCOUNTER — Encounter (HOSPITAL_COMMUNITY): Payer: 59

## 2017-04-18 ENCOUNTER — Encounter (HOSPITAL_COMMUNITY): Payer: 59

## 2017-04-19 ENCOUNTER — Other Ambulatory Visit (HOSPITAL_COMMUNITY): Payer: Self-pay | Admitting: Neurosurgery

## 2017-04-19 DIAGNOSIS — M25511 Pain in right shoulder: Secondary | ICD-10-CM

## 2017-04-23 ENCOUNTER — Encounter (HOSPITAL_COMMUNITY): Payer: 59

## 2017-04-23 ENCOUNTER — Ambulatory Visit (HOSPITAL_COMMUNITY)
Admission: RE | Admit: 2017-04-23 | Discharge: 2017-04-23 | Disposition: A | Discharge status: Home / Self Care | Payer: 59 | Source: Ambulatory Visit | Attending: Neurosurgery | Admitting: Neurosurgery

## 2017-04-23 DIAGNOSIS — M75101 Unspecified rotator cuff tear or rupture of right shoulder, not specified as traumatic: Secondary | ICD-10-CM | POA: Diagnosis not present

## 2017-04-23 DIAGNOSIS — R937 Abnormal findings on diagnostic imaging of other parts of musculoskeletal system: Secondary | ICD-10-CM | POA: Insufficient documentation

## 2017-04-23 DIAGNOSIS — M25511 Pain in right shoulder: Secondary | ICD-10-CM | POA: Diagnosis present

## 2017-04-25 ENCOUNTER — Encounter (HOSPITAL_COMMUNITY): Payer: 59

## 2017-05-03 ENCOUNTER — Telehealth: Payer: Self-pay | Admitting: Orthopedic Surgery

## 2017-05-03 NOTE — Telephone Encounter (Signed)
Patient called to request to see Dr Romeo AppleHarrison again - relays new problem with right shoulder, per recent MRI ordered by her neurosurgeon (to whom we referred) - Dr Wynetta Emeryram.  MRI performed at [facility]; patient aware Dr Romeo AppleHarrison can access film/report, however, advised that notes are needed from Dr Wynetta Emeryram for Dr Romeo AppleHarrison to review. We have faxed a request to Dr Wynetta Emeryram.  Appointment pending. Patient aware.

## 2017-05-06 NOTE — Telephone Encounter (Signed)
Patient called to relay that she had spoken with Dr Lonie Peakram's assistant, and that notes will be faxed as discussed.

## 2017-05-14 ENCOUNTER — Ambulatory Visit: Payer: 59 | Admitting: Orthopedic Surgery

## 2017-05-14 ENCOUNTER — Encounter: Payer: Self-pay | Admitting: Orthopedic Surgery

## 2017-05-14 VITALS — BP 104/62 | HR 113 | Ht 65.0 in | Wt 135.0 lb

## 2017-05-14 DIAGNOSIS — M75111 Incomplete rotator cuff tear or rupture of right shoulder, not specified as traumatic: Secondary | ICD-10-CM

## 2017-05-14 DIAGNOSIS — M75101 Unspecified rotator cuff tear or rupture of right shoulder, not specified as traumatic: Secondary | ICD-10-CM

## 2017-05-14 NOTE — Progress Notes (Signed)
Progress Note   Patient ID: [name], female   DOB: 05/22/1965, 52 y.o.   MRN: [id number]  Chief Complaint  Patient presents with  . Shoulder Pain    right    52 year old female presents after 2 neck surgeries for reevaluation of rotator cuff tear right shoulder.  Patient had an anterior cervical fusion followed by posterior decompression and fusion by Dr. Wynetta Emeryram.  She says she did fine for a day or 2 and then a few days after surgery woke up with intense pain in her right upper shoulder and neck area.  Her pain is localized to the right trapezius muscle associated with painful forward elevation as well as pain with lifting things away from her body.  She says she is having trouble sleeping at night neck pain intensity waxes and wanes.  She denies any numbness or tingling.     Review of Systems  Constitutional: Negative for chills and fever.  Neurological: Positive for tingling. Negative for focal weakness.  Psychiatric/Behavioral: Positive for depression.   Current Meds  Medication Sig  . acetaminophen (TYLENOL) 500 MG tablet Take 1,000 mg by mouth every 6 (six) hours as needed for moderate pain.  Marland Kitchen. albuterol (VENTOLIN HFA) 108 (90 BASE) MCG/ACT inhaler Inhale 2 puffs into the lungs every 4 (four) hours as needed for shortness of breath.   Marland Kitchen. aspirin-acetaminophen-caffeine (EXCEDRIN MIGRAINE) 250-250-65 MG tablet Take 2 tablets by mouth daily as needed for headache or migraine.   . bismuth subsalicylate (PEPTO BISMOL) 262 MG chewable tablet Chew 524 mg by mouth as needed for indigestion.  . Calcium Carb-Cholecalciferol (CALCIUM 600+D3) 600-800 MG-UNIT TABS Take 1 tablet by mouth daily.   . cetirizine (ZYRTEC) 10 MG tablet Take 10 mg by mouth daily.  . cyclobenzaprine (FLEXERIL) 10 MG tablet Take 1 tablet (10 mg total) by mouth 3 (three) times daily as needed for muscle spasms.  Marland Kitchen. docusate sodium (COLACE) 100 MG capsule Take 100 mg by mouth daily as needed for mild constipation.    Marland Kitchen. levothyroxine (SYNTHROID, LEVOTHROID) 75 MCG tablet Take 75 mcg by mouth daily before breakfast.  . LORazepam (ATIVAN) 0.5 MG tablet Take 1 tablet (0.5 mg total) by mouth at bedtime as needed for anxiety. (Patient taking differently: Take 0.5 mg by mouth 2 (two) times daily as needed for anxiety or sleep. )  . meloxicam (MOBIC) 7.5 MG tablet TAKE 1 TABLET BY MOUTH EVERY 2 DAYS  . mometasone-formoterol (DULERA) 200-5 MCG/ACT AERO Inhale 1 puff into the lungs 2 (two) times daily.  . pantoprazole (PROTONIX) 40 MG tablet Take 40 mg by mouth daily.  . Simethicone (GAS-X PO) Take 2 tablets by mouth daily as needed (gas).  . SUMAtriptan 6 MG/0.5ML SOAJ Inject 6 mg as directed daily as needed (migraine).   . traMADol (ULTRAM) 50 MG tablet Take 1 tablet (50 mg total) by mouth every 6 (six) hours as needed. (Patient taking differently: Take 50 mg by mouth every 6 (six) hours as needed for moderate pain. )  . Triamcinolone Acetonide (NASACORT ALLERGY 24HR NA) Place 1 spray into the nose daily as needed (allergies).  . valACYclovir (VALTREX) 500 MG tablet Take 2,000 mg by mouth See admin instructions. Take 2000mg s at first sign of fever blister outbreak then take 500mg s daily until gone    Past Medical History:  Diagnosis Date  . Anxiety   . Asthma   . Cervical radiculopathy   . Cluster headaches   . COPD (chronic obstructive pulmonary disease) (HCC)   .  COPD (chronic obstructive pulmonary disease) with chronic bronchitis (HCC)   . GERD (gastroesophageal reflux disease)   . Hyperlipemia   . Hypoglycemia   . Hypothyroidism   . Migraine   . Pneumothorax   . Rectal discharge 07/28/2010  . Shortness of breath   . Sleep apnea    cannot tolerate-not using CPAP  . Sluggishness 07/28/2010     Allergies  Allergen Reactions  . Aspirin Shortness Of Breath    Causes asthma flares   . Penicillins Other (See Comments)    UNSPECIFIED REACTION OF CHILDHOOD  Has patient had a PCN reaction causing  immediate rash, facial/tongue/throat swelling, SOB or lightheadedness with hypotension: NO Has patient had a PCN reaction causing severe rash involving mucus membranes or skin necrosis: NO Has patient had a PCN reaction that required hospitalization: no Has patient had a PCN reaction occurring within the last 10 years: NO If all of the above answers are "NO", then may proceed with Cephalosporin use.   . Eggs Or Egg-Derived Products Diarrhea and Other (See Comments)    BLOATING > GAS  . Vicodin [Hydrocodone-Acetaminophen] Other (See Comments)    Headaches     BP 104/62   Pulse (!) 113   Ht 5\' 5"  (1.651 m)   Wt 135 lb (61.2 kg)   BMI 22.47 kg/m    Physical Exam  Constitutional: She is oriented to person, place, and time. She appears well-developed and well-nourished.  Musculoskeletal:       Arms: Neurological: She is alert and oriented to person, place, and time.  Psychiatric: She has a normal mood and affect. Judgment normal.  Vitals reviewed.   Ortho Exam   Medical decision-making  Imaging: MRI and MRI report from April 23 2017.4 cm undersurface tear leading edge supraspinatus tendon mild bursitis type I acromion  Compared to the MRI that we had prior to my shoulder arthroscopy and surgery shows a 0.3 cm tear in the same area with less bursal fluid then noted in the prior testing.  I reviewed both films and both reports and I agree with the findings as I have stated    Encounter Diagnoses  Name Primary?  . Rotator cuff syndrome of right shoulder Yes  . Incomplete tear of right rotator cuff      After complete history and physical exam review of imaging studies I do not think that the 0.4 cm rotator cuff tear will benefit from surgical intervention.  I have examined the shoulder arthroscopically before and could not find any such tear and there is been no change in the MRI except for the decrease in the amount of bursitis  She does have some bursal irritation and  some intense pain in the trapezius muscle which would benefit from physical therapy heat therapy and topical muscle creams  The patient does not want to go to therapy at this time so we have settled on heat and BenGay as needed but no surgery needed at this point    Fuller Canada, MD 05/14/2017 4:24 PM

## 2017-05-14 NOTE — Patient Instructions (Signed)
HEAT AND BEN GAY AS NEEDED

## 2017-05-17 ENCOUNTER — Ambulatory Visit: Payer: 59 | Admitting: Orthopedic Surgery

## 2017-09-03 ENCOUNTER — Ambulatory Visit (HOSPITAL_COMMUNITY)
Admission: RE | Admit: 2017-09-03 | Discharge: 2017-09-03 | Disposition: A | Discharge status: Home / Self Care | Payer: 59 | Source: Ambulatory Visit | Attending: Internal Medicine | Admitting: Internal Medicine

## 2017-09-03 ENCOUNTER — Other Ambulatory Visit (HOSPITAL_COMMUNITY): Payer: Self-pay | Admitting: Internal Medicine

## 2017-09-03 DIAGNOSIS — M545 Low back pain: Secondary | ICD-10-CM | POA: Insufficient documentation

## 2017-09-03 DIAGNOSIS — M5441 Lumbago with sciatica, right side: Secondary | ICD-10-CM

## 2017-09-24 ENCOUNTER — Encounter (INDEPENDENT_AMBULATORY_CARE_PROVIDER_SITE_OTHER): Payer: Self-pay | Admitting: Internal Medicine

## 2017-09-24 ENCOUNTER — Encounter (INDEPENDENT_AMBULATORY_CARE_PROVIDER_SITE_OTHER): Payer: Self-pay | Admitting: *Deleted

## 2017-09-24 ENCOUNTER — Ambulatory Visit (INDEPENDENT_AMBULATORY_CARE_PROVIDER_SITE_OTHER): Payer: 59 | Admitting: Internal Medicine

## 2017-09-24 VITALS — BP 100/80 | HR 68 | Temp 97.7°F | Ht 65.0 in | Wt 138.9 lb

## 2017-09-24 DIAGNOSIS — R1013 Epigastric pain: Secondary | ICD-10-CM

## 2017-09-24 NOTE — Progress Notes (Signed)
Subjective:    Patient ID: [name], female    DOB: 12/05/1965, 52 y.o.   MRN: [id number]  HPI Referred by Dr. Nita SellsJohn Hall for abdominal pain.  She has pain inher epigastric region and sometimes in her mid abdomen. She says it is worse after eating. She has had symptoms for a couple of months. She takes Protonix for acid reflux. She says this is not acid reflux. She does not have a GB. Removed in 1993 due to gallstones. Her appetite is okay. No weight loss.  She has a BM every 3-4 days with the colace. She takes Excedrin Migraine x 1-2 a weeks. No other NSAIDs.  She says she is hurting in her epigastric region today.  No family hx of pancreatic cancer.   Her last colonoscopy was in April of 2015  Normal except for small hemorrhoids.   Review of Systems Past Medical History:  Diagnosis Date  . Anxiety   . Asthma   . Cervical radiculopathy   . Cluster headaches   . COPD (chronic obstructive pulmonary disease) (HCC)   . COPD (chronic obstructive pulmonary disease) with chronic bronchitis (HCC)   . GERD (gastroesophageal reflux disease)   . Hyperlipemia   . Hypoglycemia   . Hypothyroidism   . Migraine   . Pneumothorax   . Rectal discharge 07/28/2010  . Shortness of breath   . Sleep apnea    cannot tolerate-not using CPAP  . Sluggishness 07/28/2010    Past Surgical History:  Procedure Laterality Date  . ABDOMINAL HYSTERECTOMY     with bladder suspension  . CERVICAL FUSION    . CHOLECYSTECTOMY    . COLONOSCOPY N/A 07/30/2013   Procedure: COLONOSCOPY;  Surgeon: Malissa HippoNajeeb U Rehman, MD;  [facility];  Service: Endoscopy;  Laterality: N/A;  930  . EXAM UNDER ANESTHESIA WITH MANIPULATION OF SHOULDER Right 10/11/2016   Procedure: EXAM UNDER ANESTHESIA WITH MANIPULATION OF SHOULDER;  Surgeon: Vickki HearingHarrison, Stanley E, MD;  Location: [facility];  Service: Orthopedics;  Laterality: Right;  . INCISIONAL HERNIA REPAIR N/A 07/30/2016   Procedure: HERNIA REPAIR INCISIONAL WITH  MESH;  Surgeon: Franky MachoMark Jenkins, MD;  Location: [facility];  Service: General;  Laterality: N/A;  . LOBECTOMY     right lung   . POSTERIOR CERVICAL LAMINECTOMY Right 04/05/2017   Procedure: Right C6-7 Posterior cervical laminectomy;  Surgeon: Donalee Citrinram, Gary, MD;  [facility];  Service: Neurosurgery;  Laterality: Right;  Right C6-7 Posterior cervical laminectomy  . SHOULDER ARTHROSCOPY WITH ROTATOR CUFF REPAIR Right 10/11/2016   Procedure: SHOULDER ARTHROSCOPY;  Surgeon: Vickki HearingHarrison, Stanley E, MD;  Location: [facility];  Service: Orthopedics;  Laterality: Right;  . TUBAL LIGATION      Allergies  Allergen Reactions  . Aspirin Shortness Of Breath    Causes asthma flares   . Penicillins Other (See Comments)    UNSPECIFIED REACTION OF CHILDHOOD  Has patient had a PCN reaction causing immediate rash, facial/tongue/throat swelling, SOB or lightheadedness with hypotension: NO Has patient had a PCN reaction causing severe rash involving mucus membranes or skin necrosis: NO Has patient had a PCN reaction that required hospitalization: no Has patient had a PCN reaction occurring within the last 10 years: NO If all of the above answers are "NO", then may proceed with Cephalosporin use.   . Eggs Or Egg-Derived Products Diarrhea and Other (See Comments)    BLOATING > GAS  . Vicodin [Hydrocodone-Acetaminophen] Other (See Comments)    Headaches  Current Outpatient Medications on File Prior to Visit  Medication Sig Dispense Refill  . acetaminophen (TYLENOL) 500 MG tablet Take 1,000 mg by mouth every 6 (six) hours as needed for moderate pain.    Marland Kitchen albuterol (VENTOLIN HFA) 108 (90 BASE) MCG/ACT inhaler Inhale 2 puffs into the lungs every 4 (four) hours as needed for shortness of breath.     Marland Kitchen aspirin-acetaminophen-caffeine (EXCEDRIN MIGRAINE) 250-250-65 MG tablet Take 2 tablets by mouth daily as needed for headache or migraine.     . bismuth subsalicylate (PEPTO BISMOL) 262 MG chewable tablet Chew 524 mg by mouth  as needed for indigestion.    . cetirizine (ZYRTEC) 10 MG tablet Take 10 mg by mouth daily.    Marland Kitchen docusate sodium (COLACE) 100 MG capsule Take 100 mg by mouth daily.     Marland Kitchen levothyroxine (SYNTHROID, LEVOTHROID) 75 MCG tablet Take 75 mcg by mouth daily before breakfast.    . LORazepam (ATIVAN) 0.5 MG tablet Take 1 tablet (0.5 mg total) by mouth at bedtime as needed for anxiety. (Patient taking differently: Take 0.5 mg by mouth 2 (two) times daily as needed for anxiety or sleep. ) 30 tablet 1  . mometasone-formoterol (DULERA) 200-5 MCG/ACT AERO Inhale 1 puff into the lungs 2 (two) times daily.    . pantoprazole (PROTONIX) 40 MG tablet Take 40 mg by mouth daily.    . SUMAtriptan 6 MG/0.5ML SOAJ Inject 6 mg as directed daily as needed (migraine).     . traMADol (ULTRAM) 50 MG tablet Take 1 tablet (50 mg total) by mouth every 6 (six) hours as needed. (Patient taking differently: Take 50 mg by mouth every 6 (six) hours as needed for moderate pain. ) 60 tablet 5  . valACYclovir (VALTREX) 500 MG tablet Take 2,000 mg by mouth See admin instructions. Take 2000mg s at first sign of fever blister outbreak then take 500mg s daily until gone     No current facility-administered medications on file prior to visit.         Objective:   Physical Exam Blood pressure 100/80, pulse 68, temperature 97.7 F (36.5 C), height 5\' 5"  (1.651 m), weight 138 lb 14.4 oz (63 kg). Alert and oriented. Skin warm and dry. Oral mucosa is moist.   . Sclera anicteric, conjunctivae is pink. Thyroid not enlarged. No cervical lymphadenopathy. Lungs clear. Heart regular rate and rhythm.  Abdomen is soft. Bowel sounds are positive. No hepatomegaly. No abdominal masses felt. Slight epigastric tenderness.  No edema to lower extremities.    08/22/2017 ALP 109, AST 20, ALT 15.  H and H 11.5 and 36.0      Assessment & Plan:  Epigastric pain. Am going to get an US abdomen. She will continue the Protonix BID. May consider EGD if [place] is  negative and she continues to have pain.

## 2017-09-24 NOTE — Patient Instructions (Signed)
US abdomen 

## 2017-09-27 ENCOUNTER — Ambulatory Visit (HOSPITAL_COMMUNITY)
Admission: RE | Admit: 2017-09-27 | Discharge: 2017-09-27 | Disposition: A | Discharge status: Home / Self Care | Payer: 59 | Source: Ambulatory Visit | Attending: Internal Medicine | Admitting: Internal Medicine

## 2017-09-27 DIAGNOSIS — K760 Fatty (change of) liver, not elsewhere classified: Secondary | ICD-10-CM | POA: Diagnosis not present

## 2017-09-27 DIAGNOSIS — R1013 Epigastric pain: Secondary | ICD-10-CM

## 2017-09-27 DIAGNOSIS — I701 Atherosclerosis of renal artery: Secondary | ICD-10-CM | POA: Diagnosis not present

## 2017-10-01 ENCOUNTER — Telehealth (INDEPENDENT_AMBULATORY_CARE_PROVIDER_SITE_OTHER): Payer: Self-pay | Admitting: Internal Medicine

## 2017-10-01 DIAGNOSIS — R1013 Epigastric pain: Secondary | ICD-10-CM

## 2017-10-01 MED ORDER — SUCRALFATE 1 G PO TABS: 1.0000 g | ORAL_TABLET | Freq: Three times a day (TID) | ORAL | 2 refills | Status: DC

## 2017-10-01 NOTE — Telephone Encounter (Signed)
OV in 6 weeks. 

## 2017-10-03 ENCOUNTER — Other Ambulatory Visit (HOSPITAL_COMMUNITY): Payer: Self-pay | Admitting: Neurosurgery

## 2017-10-03 DIAGNOSIS — M5126 Other intervertebral disc displacement, lumbar region: Secondary | ICD-10-CM

## 2017-10-09 ENCOUNTER — Telehealth (HOSPITAL_COMMUNITY): Payer: Self-pay | Admitting: Internal Medicine

## 2017-10-09 NOTE — Telephone Encounter (Signed)
10/09/17  pt is starting a new job and needs a 5:30 appt I offered 4:45 but doesn't want to do that just starting a new job... she will call us back

## 2017-10-15 ENCOUNTER — Ambulatory Visit (HOSPITAL_COMMUNITY)
Admission: RE | Admit: 2017-10-15 | Discharge: 2017-10-15 | Disposition: A | Discharge status: Home / Self Care | Payer: 59 | Source: Ambulatory Visit | Attending: Neurosurgery | Admitting: Neurosurgery

## 2017-10-15 DIAGNOSIS — M5136 Other intervertebral disc degeneration, lumbar region: Secondary | ICD-10-CM | POA: Insufficient documentation

## 2017-10-15 DIAGNOSIS — M48061 Spinal stenosis, lumbar region without neurogenic claudication: Secondary | ICD-10-CM | POA: Diagnosis not present

## 2017-10-15 DIAGNOSIS — M5127 Other intervertebral disc displacement, lumbosacral region: Secondary | ICD-10-CM | POA: Diagnosis not present

## 2017-10-15 DIAGNOSIS — M5126 Other intervertebral disc displacement, lumbar region: Secondary | ICD-10-CM | POA: Diagnosis present

## 2017-10-16 ENCOUNTER — Encounter (HOSPITAL_COMMUNITY): Payer: Self-pay

## 2017-10-16 ENCOUNTER — Ambulatory Visit (HOSPITAL_COMMUNITY): Payer: 59

## 2017-11-12 ENCOUNTER — Ambulatory Visit (INDEPENDENT_AMBULATORY_CARE_PROVIDER_SITE_OTHER): Payer: 59 | Admitting: Internal Medicine

## 2018-03-10 ENCOUNTER — Other Ambulatory Visit (HOSPITAL_COMMUNITY): Payer: Self-pay | Admitting: Respiratory Therapy

## 2018-03-10 DIAGNOSIS — R0602 Shortness of breath: Secondary | ICD-10-CM

## 2018-03-18 ENCOUNTER — Other Ambulatory Visit (HOSPITAL_COMMUNITY): Payer: Self-pay | Admitting: Internal Medicine

## 2018-03-18 DIAGNOSIS — Z780 Asymptomatic menopausal state: Secondary | ICD-10-CM

## 2018-03-18 DIAGNOSIS — N644 Mastodynia: Secondary | ICD-10-CM

## 2018-03-20 ENCOUNTER — Other Ambulatory Visit (HOSPITAL_COMMUNITY): Payer: Self-pay | Admitting: Internal Medicine

## 2018-03-20 DIAGNOSIS — N644 Mastodynia: Secondary | ICD-10-CM

## 2018-03-26 ENCOUNTER — Ambulatory Visit (HOSPITAL_COMMUNITY)
Admission: RE | Admit: 2018-03-26 | Discharge: 2018-03-26 | Disposition: A | Discharge status: Home / Self Care | Payer: 59 | Source: Ambulatory Visit | Attending: Internal Medicine | Admitting: Internal Medicine

## 2018-03-26 DIAGNOSIS — R0602 Shortness of breath: Secondary | ICD-10-CM | POA: Diagnosis present

## 2018-03-26 MED ORDER — ALBUTEROL SULFATE (2.5 MG/3ML) 0.083% IN NEBU
2.5000 mg | INHALATION_SOLUTION | Freq: Once | RESPIRATORY_TRACT | Status: AC
  Administered 2018-03-26: 2.5 mg via RESPIRATORY_TRACT

## 2018-03-27 LAB — PULMONARY FUNCTION TEST
DL/VA % pred: 61 %
DL/VA: 3.06 ml/min/mmHg/L
DLCO unc % pred: 46 %
DLCO unc: 12.29 ml/min/mmHg
FEF 25-75 Post: 1.79 L/sec
FEF 25-75 Pre: 1.3 L/sec
FEF2575-%Change-Post: 37 %
FEF2575-%Pred-Post: 66 %
FEF2575-%Pred-Pre: 48 %
FEV1-%Change-Post: 9 %
FEV1-%Pred-Post: 78 %
FEV1-%Pred-Pre: 71 %
FEV1-Post: 2.24 L
FEV1-Pre: 2.04 L
FEV1FVC-%Change-Post: 1 %
FEV1FVC-%Pred-Pre: 87 %
FEV6-%Change-Post: 6 %
FEV6-%Pred-Post: 88 %
FEV6-%Pred-Pre: 83 %
FEV6-Post: 3.11 L
FEV6-Pre: 2.93 L
FEV6FVC-%Change-Post: -1 %
FEV6FVC-%Pred-Post: 101 %
FEV6FVC-%Pred-Pre: 103 %
FVC-%Change-Post: 7 %
FVC-%Pred-Post: 86 %
FVC-%Pred-Pre: 80 %
FVC-Post: 3.15 L
FVC-Pre: 2.93 L
Post FEV1/FVC ratio: 71 %
Post FEV6/FVC ratio: 99 %
Pre FEV1/FVC ratio: 70 %
Pre FEV6/FVC Ratio: 100 %
RV % pred: 126 %
RV: 2.45 L
TLC % pred: 101 %
TLC: 5.36 L

## 2018-04-15 ENCOUNTER — Ambulatory Visit (HOSPITAL_COMMUNITY): Payer: 59

## 2018-04-15 ENCOUNTER — Ambulatory Visit (HOSPITAL_COMMUNITY)
Admission: RE | Admit: 2018-04-15 | Discharge: 2018-04-15 | Disposition: A | Discharge status: Home / Self Care | Payer: 59 | Source: Ambulatory Visit | Attending: Internal Medicine | Admitting: Internal Medicine

## 2018-04-15 ENCOUNTER — Encounter (HOSPITAL_COMMUNITY): Payer: Self-pay

## 2018-04-15 ENCOUNTER — Ambulatory Visit (HOSPITAL_COMMUNITY): Admission: RE | Admit: 2018-04-15 | Payer: 59 | Source: Ambulatory Visit

## 2018-04-15 DIAGNOSIS — Z780 Asymptomatic menopausal state: Secondary | ICD-10-CM | POA: Insufficient documentation

## 2018-04-15 DIAGNOSIS — N644 Mastodynia: Secondary | ICD-10-CM | POA: Insufficient documentation

## 2018-04-21 NOTE — Progress Notes (Signed)
[facility] Consultation      Assessment and Plan:  COPD/emphysema. -Suspect the patient has had a acute exacerbation of COPD with a prolonged recovery time. - We will need to check a chest x-ray to ensure no other abnormalities are seen. - She has been taking Dulera 1 puff twice daily but it is no longer covered, switched to [place] and given coupon.  If this is not covered she will need to call her insurance to check what inhalers are covered.  GERD. -Severe GERD with daily symptoms, may be contributing to dyspnea. - She is asked to continue famotidine once daily, she is prescribed omeprazole 20 mg once daily.  She was previously prescribed Protonix and Dexilant but they were not covered.  Obstructive sleep apnea. -Previously diagnosed with OSA around 2010, was intolerant of CPAP after 2 months and stopped.  Not interested in reevaluation at this time.   Date: 04/22/2018  MRN# [id number] [name] 03-Apr-1966   [name] is a 53 y.o. old female seen in consultation for chief complaint of:    Chief Complaint  Patient presents with  . pulmonary consult    per Dr. Margo Aye- hx of COPD dx in 1998. pt reports of dry cough, wheezing, sob with exertion and coughing spell & chest tightness.     HPI:  She was apparently diagnosed with COPD back in 1998, she was having chronic bronchitis back then. She was diagnosed with COPD/asthma and she was started on an inhaler. Over the years she has been on advair, symbicort. She is currently on dulera, which she has been on for years, she is taking 1 puff twice daily.   She noticed that she was having worsening sob about 3 months ago, she was then sent for a full PFT which showed borderline obstructive lung disease, but more significantly showed significantly reduced DLCO at 46%. She had a flare up chest tightness and dyspnea on Jan 1, she was completely short of breath with minimal activities such as bathing.  Albuterol seemed to help. She went to her PCP, she received breathing treatment, steroid injection, abx, prednisone. Her symptoms improved with this regimen improved 4-5 days but then got worse again. Currently she gets winded with bathing, drying off her dogs, or other activities which involve bending.  Patient inhaler use was demonstrated today.  She quit smoking in 2009. She has 2 dogs, 1 sleeps in bed. She has "terrible" reflux. Insurance would not cover protonix, dexilant,  omeprazole did not help, now on OTC famotidine. She gets reflux symptoms daily, especially if she is bending over.  She denies sinus drainage. She has mild sleepiness during the day, does not nap. She has been diagnosed with OSA about 10 years ago, she was put on CPAP, but could not tolerate it and stopped using it after using it for 2-3 months, she does not have the machine any longer.   **PFT 07/25/2017>> tracings personally reviewed.  FVC is 80% predicted, FEV1 is 71% present, there is no significant improvement bronchodilator.  Ratio is 70%.  TLC is 101% predicted, RV/TLC ratio is within normal limits.  DLCO is reduced at 46%.  Overall this test shows mild obstructive lung disease without significant change with bronchodilator.  DLCO is significantly reduced. **CBC 07/26/2016>> absolute eosinophil count 700.  PMHX:   Past Medical History:  Diagnosis Date  . Anxiety   . Asthma   . Cervical radiculopathy   . Cluster headaches   . COPD (chronic  obstructive pulmonary disease) (HCC)   . COPD (chronic obstructive pulmonary disease) with chronic bronchitis (HCC)   . GERD (gastroesophageal reflux disease)   . Hyperlipemia   . Hypoglycemia   . Hypothyroidism   . Migraine   . Pneumothorax   . Rectal discharge 07/28/2010  . Shortness of breath   . Sleep apnea    cannot tolerate-not using CPAP  . Sluggishness 07/28/2010   Surgical Hx:  Past Surgical History:  Procedure Laterality Date  . ABDOMINAL HYSTERECTOMY      with bladder suspension  . CERVICAL FUSION    . CHOLECYSTECTOMY    . COLONOSCOPY N/A 07/30/2013   Procedure: COLONOSCOPY;  Surgeon: Malissa HippoNajeeb U Rehman, MD;  [facility];  Service: Endoscopy;  Laterality: N/A;  930  . EXAM UNDER ANESTHESIA WITH MANIPULATION OF SHOULDER Right 10/11/2016   Procedure: EXAM UNDER ANESTHESIA WITH MANIPULATION OF SHOULDER;  Surgeon: Vickki HearingHarrison, Stanley E, MD;  Location: [facility];  Service: Orthopedics;  Laterality: Right;  . INCISIONAL HERNIA REPAIR N/A 07/30/2016   Procedure: HERNIA REPAIR INCISIONAL WITH MESH;  Surgeon: Franky MachoMark Jenkins, MD;  Location: [facility];  Service: General;  Laterality: N/A;  . LOBECTOMY     right lung   . POSTERIOR CERVICAL LAMINECTOMY Right 04/05/2017   Procedure: Right C6-7 Posterior cervical laminectomy;  Surgeon: Donalee Citrinram, Gary, MD;  [facility];  Service: Neurosurgery;  Laterality: Right;  Right C6-7 Posterior cervical laminectomy  . SHOULDER ARTHROSCOPY WITH ROTATOR CUFF REPAIR Right 10/11/2016   Procedure: SHOULDER ARTHROSCOPY;  Surgeon: Vickki HearingHarrison, Stanley E, MD;  Location: [facility];  Service: Orthopedics;  Laterality: Right;  . TUBAL LIGATION     Family Hx:  Family History  Problem Relation Age of Onset  . COPD Mother   . Diabetes Mother   . Hyperlipidemia Mother   . Hypertension Mother   . Bipolar disorder Son   . Heart failure Maternal Grandmother   . Hypertension Maternal Grandmother   . Thyroid disease Maternal Grandmother   . Diabetes Paternal Grandmother   . Alzheimer's disease Paternal Grandmother   . Heart failure Paternal Grandfather   . Hypertension Paternal Grandfather   . Hypertension Father   . Colon cancer Neg Hx    Social Hx:   Social History   Tobacco Use  . Smoking status: Former Smoker    Packs/day: 1.00    Years: 30.00    Pack years: 30.00    Types: Cigarettes    Last attempt to quit: 02/21/2008    Years since quitting: 10.1  . Smokeless tobacco: Never Used  Substance Use Topics  . Alcohol use: No      Alcohol/week: 0.0 standard drinks    Comment: no  . Drug use: No   Medication:    Current Outpatient Medications:  .  acetaminophen (TYLENOL) 500 MG tablet, Take 1,000 mg by mouth every 6 (six) hours as needed for moderate pain., Disp: , Rfl:  .  albuterol (VENTOLIN HFA) 108 (90 BASE) MCG/ACT inhaler, Inhale 2 puffs into the lungs every 4 (four) hours as needed for shortness of breath. , Disp: , Rfl:  .  aspirin-acetaminophen-caffeine (EXCEDRIN MIGRAINE) 250-250-65 MG tablet, Take 2 tablets by mouth daily as needed for headache or migraine. , Disp: , Rfl:  .  bismuth subsalicylate (PEPTO BISMOL) 262 MG chewable tablet, Chew 524 mg by mouth as needed for indigestion., Disp: , Rfl:  .  cetirizine (ZYRTEC) 10 MG tablet, Take 10 mg by mouth daily., Disp: , Rfl:  .  docusate sodium (COLACE) 100 MG capsule, Take 100 mg by mouth daily. , Disp: , Rfl:  .  levothyroxine (SYNTHROID, LEVOTHROID) 75 MCG tablet, Take 75 mcg by mouth daily before breakfast., Disp: , Rfl:  .  LORazepam (ATIVAN) 0.5 MG tablet, Take 1 tablet (0.5 mg total) by mouth at bedtime as needed for anxiety. (Patient taking differently: Take 0.5 mg by mouth 2 (two) times daily as needed for anxiety or sleep. ), Disp: 30 tablet, Rfl: 1 .  mometasone-formoterol (DULERA) 200-5 MCG/ACT AERO, Inhale 1 puff into the lungs 2 (two) times daily., Disp: , Rfl:  .  SUMAtriptan 6 MG/0.5ML SOAJ, Inject 6 mg as directed daily as needed (migraine). , Disp: , Rfl:  .  traMADol (ULTRAM) 50 MG tablet, Take 1 tablet (50 mg total) by mouth every 6 (six) hours as needed. (Patient taking differently: Take 50 mg by mouth every 6 (six) hours as needed for moderate pain. ), Disp: 60 tablet, Rfl: 5 .  valACYclovir (VALTREX) 500 MG tablet, Take 2,000 mg by mouth See admin instructions. Take 2000mg s at first sign of fever blister outbreak then take 500mg s daily until gone, Disp: , Rfl:    Allergies:  Aspirin; Penicillins; Eggs or egg-derived products; and  Vicodin [hydrocodone-acetaminophen]  Review of Systems: Gen:  Denies  fever, sweats, chills HEENT: Denies blurred vision, double vision. bleeds, sore throat Cvc:  No dizziness, chest pain. Resp:   Denies cough or sputum production, shortness of breath Gi: Denies swallowing difficulty, stomach pain. Gu:  Denies bladder incontinence, burning urine Ext:   No Joint pain, stiffness. Skin: No skin rash,  hives  Endoc:  No polyuria, polydipsia. Psych: No depression, insomnia. Other:  All other systems were reviewed with the patient and were negative other that what is mentioned in the HPI.   Physical Examination:   VS: BP 120/62 (BP Location: Left Arm, Cuff Size: Normal)   Pulse 83   Ht 5\' 5"  (1.651 m)   Wt 136 lb 12.8 oz (62.1 kg)   SpO2 94%   BMI 22.76 kg/m   General Appearance: No distress  Neuro:without focal findings,  speech normal,  HEENT: PERRLA, EOM intact.   Pulmonary: normal breath sounds, No wheezing.  CardiovascularNormal S1,S2.  No m/r/g.   Abdomen: Benign, Soft, non-tender. Renal:  No costovertebral tenderness  GU:  No performed at this time. Endoc: No evident thyromegaly, no signs of acromegaly. Skin:   warm, no rashes, no ecchymosis  Extremities: normal, no cyanosis, clubbing.  Other findings:    LABORATORY PANEL:   CBC No results for input(s): WBC, HGB, HCT, PLT in the last 168 hours. ------------------------------------------------------------------------------------------------------------------  Chemistries  No results for input(s): NA, K, CL, CO2, GLUCOSE, BUN, CREATININE, CALCIUM, MG, AST, ALT, ALKPHOS, BILITOT in the last 168 hours.  Invalid input(s): GFRCGP ------------------------------------------------------------------------------------------------------------------  Cardiac Enzymes No results for input(s): TROPONINI in the last 168 hours. ------------------------------------------------------------  RADIOLOGY:  No results  found.     Thank  you for the consultation and for allowing [facility] to assist in the care of your patient. Our recommendations are noted above.  Please contact us if we can be of further service.   Wells Guileseep Athleen Feltner, M.D., F.C.C.P.  Board Certified in Internal Medicine, Pulmonary Medicine, Critical Care Medicine, and Sleep Medicine.  [facility] Office Number: [phone]   04/22/2018

## 2018-04-22 ENCOUNTER — Ambulatory Visit: Payer: 59 | Admitting: Internal Medicine

## 2018-04-22 ENCOUNTER — Ambulatory Visit
Admission: RE | Admit: 2018-04-22 | Discharge: 2018-04-22 | Disposition: A | Discharge status: Home / Self Care | Payer: 59 | Source: Ambulatory Visit | Attending: Internal Medicine | Admitting: Internal Medicine

## 2018-04-22 ENCOUNTER — Encounter: Payer: Self-pay | Admitting: Internal Medicine

## 2018-04-22 VITALS — BP 120/62 | HR 83 | Ht 65.0 in | Wt 136.8 lb

## 2018-04-22 DIAGNOSIS — R0600 Dyspnea, unspecified: Secondary | ICD-10-CM

## 2018-04-22 DIAGNOSIS — R0609 Other forms of dyspnea: Secondary | ICD-10-CM | POA: Insufficient documentation

## 2018-04-22 DIAGNOSIS — K219 Gastro-esophageal reflux disease without esophagitis: Secondary | ICD-10-CM

## 2018-04-22 DIAGNOSIS — J441 Chronic obstructive pulmonary disease with (acute) exacerbation: Secondary | ICD-10-CM

## 2018-04-22 MED ORDER — OMEPRAZOLE 20 MG PO CPDR: 20.0000 mg | DELAYED_RELEASE_CAPSULE | Freq: Every day | ORAL | 11 refills | Status: DC

## 2018-04-22 MED ORDER — FLUTICASONE-SALMETEROL 250-50 MCG/DOSE IN AEPB: 1.0000 | INHALATION_SPRAY | Freq: Two times a day (BID) | RESPIRATORY_TRACT | 5 refills | Status: DC

## 2018-04-22 MED ORDER — PREDNISONE 10 MG (21) PO TBPK: ORAL_TABLET | ORAL | 0 refills | Status: DC

## 2018-04-22 NOTE — Patient Instructions (Addendum)
We will start [place] inhaler 1 puff twice daily. Take prilosec once daily in addition to famotidine.  Take tapering dose of prednisone as prescribed.  Will send you for a CXR today. Will call you if there are any urgent abnormalities.  Recommend remove pets from bedroom.

## 2018-04-23 ENCOUNTER — Telehealth: Payer: Self-pay | Admitting: Internal Medicine

## 2018-04-24 ENCOUNTER — Institutional Professional Consult (permissible substitution): Payer: 59 | Admitting: Pulmonary Disease

## 2018-04-24 ENCOUNTER — Other Ambulatory Visit: Payer: Self-pay | Admitting: Internal Medicine

## 2018-04-24 MED ORDER — PANTOPRAZOLE SODIUM 40 MG PO TBEC: 40.0000 mg | DELAYED_RELEASE_TABLET | Freq: Every day | ORAL | 1 refills | Status: DC

## 2018-04-24 NOTE — Telephone Encounter (Signed)
Was the 20 mg or 40 mg?

## 2018-04-24 NOTE — Telephone Encounter (Signed)
Pt called back in reference to rx, wanting an update

## 2018-04-24 NOTE — Telephone Encounter (Signed)
Received verbal from Dr.Ramachandran to send 40 mg of Protonix. Nothing further needed.

## 2018-05-21 ENCOUNTER — Ambulatory Visit: Payer: 59 | Admitting: Internal Medicine

## 2018-05-21 ENCOUNTER — Encounter: Payer: Self-pay | Admitting: Internal Medicine

## 2018-05-21 VITALS — BP 110/68 | HR 92 | Resp 16 | Ht 65.0 in | Wt 139.0 lb

## 2018-05-21 DIAGNOSIS — R0609 Other forms of dyspnea: Secondary | ICD-10-CM

## 2018-05-21 DIAGNOSIS — K219 Gastro-esophageal reflux disease without esophagitis: Secondary | ICD-10-CM

## 2018-05-21 DIAGNOSIS — J441 Chronic obstructive pulmonary disease with (acute) exacerbation: Secondary | ICD-10-CM

## 2018-05-21 DIAGNOSIS — J4551 Severe persistent asthma with (acute) exacerbation: Secondary | ICD-10-CM

## 2018-05-21 DIAGNOSIS — R0600 Dyspnea, unspecified: Secondary | ICD-10-CM

## 2018-05-21 MED ORDER — FLUTICASONE-UMECLIDIN-VILANT 100-62.5-25 MCG/INH IN AEPB: 1.0000 | INHALATION_SPRAY | Freq: Every day | RESPIRATORY_TRACT | 0 refills | Status: DC

## 2018-05-21 MED ORDER — ALBUTEROL SULFATE (2.5 MG/3ML) 0.083% IN NEBU: 2.5000 mg | INHALATION_SOLUTION | Freq: Four times a day (QID) | RESPIRATORY_TRACT | 12 refills | Status: DC | PRN

## 2018-05-21 NOTE — Progress Notes (Signed)
[facility]      Assessment and Plan:  COPD/emphysema. Asthma with exacerbation. -Suspect the patient has had a acute exacerbation of COPD with a prolonged recovery time. - We will start trilogy inhaler. - Review of previous CBC shows elevated eosinophil count.  Will check IgE, CBC, start Singulair, will need to obtain results of allergy testing from her allergist.  GERD. -Severe GERD with daily symptoms, may be contributing to dyspnea.  Control has now improved with Protonix. - Continue protonix and following up with GI.   Obstructive sleep apnea. -Previously diagnosed with OSA around 2010, was intolerant of CPAP after 2 months and stopped.  Not interested in reevaluation at this time.   Date: 05/21/2018  MRN# [id number] Melissa AveCatherina W Watson 06/09/1965   [name] is a 53 y.o. old female seen in consultation for chief complaint of:    Chief Complaint  Patient presents with  . COPD    pt was started on [place]:pt states she has been coughinjg, sob, wheezing and chest pain since last office visit.    HPI:  She was apparently diagnosed with COPD back in 1998, she was having chronic bronchitis back then. She was diagnosed with COPD/asthma and she was started on an inhaler. Over the years she has been on advair, symbicort.  She has been tried and failed on multiple inhalers including Symbicort, Dulera, Spiriva, Breo, Combivent.  She noticed that she was having worsening sob about 3 months ago, she was then sent for a full PFT which showed borderline obstructive lung disease, but more significantly showed significantly reduced DLCO at 46%. Patient notes that she has "terrible" reflux, at previous visit she was prescribed Protonix and this seems to have helped. She has been following with gastro and tells me that she had an EGD.  One pet sleeps in the bed.  She was given a prednisone taper at last visit which helped.   She has been diagnosed  with OSA about 10 years ago, she was put on CPAP, but could not tolerate it and stopped using it after using it for 2-3 months, she does not have the machine any longer.   **Chest x-ray 04/22/2018>> imaging personally viewed, slightly elevated right diaphragm, some changes of chronic bronchitis noted, lungs are otherwise normal. **PFT 07/25/2017>> tracings personally reviewed.  FVC is 80% predicted, FEV1 is 71% present, there is no significant improvement bronchodilator.  Ratio is 70%.  TLC is 101% predicted, RV/TLC ratio is within normal limits.  DLCO is reduced at 46%.  Overall this test shows mild obstructive lung disease without significant change with bronchodilator.  DLCO is significantly reduced. **CBC 07/26/2016>> absolute eosinophil count 700.  Medication:    Current Outpatient Medications:  .  acetaminophen (TYLENOL) 500 MG tablet, Take 1,000 mg by mouth every 6 (six) hours as needed for moderate pain., Disp: , Rfl:  .  albuterol (VENTOLIN HFA) 108 (90 BASE) MCG/ACT inhaler, Inhale 2 puffs into the lungs every 4 (four) hours as needed for shortness of breath. , Disp: , Rfl:  .  aspirin-acetaminophen-caffeine (EXCEDRIN MIGRAINE) 250-250-65 MG tablet, Take 2 tablets by mouth daily as needed for headache or migraine. , Disp: , Rfl:  .  bismuth subsalicylate (PEPTO BISMOL) 262 MG chewable tablet, Chew 524 mg by mouth as needed for indigestion., Disp: , Rfl:  .  cetirizine (ZYRTEC) 10 MG tablet, Take 10 mg by mouth daily., Disp: , Rfl:  .  docusate sodium (COLACE) 100 MG capsule, Take 100  mg by mouth daily. , Disp: , Rfl:  .  Fluticasone-Salmeterol (WIXELA INHUB) 250-50 MCG/DOSE AEPB, Inhale 1 puff into the lungs 2 (two) times daily. Rinse mouth after use, Disp: 60 each, Rfl: 5 .  levothyroxine (SYNTHROID, LEVOTHROID) 75 MCG tablet, Take 75 mcg by mouth daily before breakfast., Disp: , Rfl:  .  LORazepam (ATIVAN) 0.5 MG tablet, Take 1 tablet (0.5 mg total) by mouth at bedtime as needed for  anxiety. (Patient taking differently: Take 0.5 mg by mouth 2 (two) times daily as needed for anxiety or sleep. ), Disp: 30 tablet, Rfl: 1 .  omeprazole (PRILOSEC) 20 MG capsule, Take 1 capsule (20 mg total) by mouth daily., Disp: 30 capsule, Rfl: 11 .  pantoprazole (PROTONIX) 40 MG tablet, Take 1 tablet (40 mg total) by mouth daily., Disp: 90 tablet, Rfl: 1 .  predniSONE (STERAPRED UNI-PAK 21 TAB) 10 MG (21) TBPK tablet, Take as directed., Disp: 21 tablet, Rfl: 0 .  SUMAtriptan 6 MG/0.5ML SOAJ, Inject 6 mg as directed daily as needed (migraine). , Disp: , Rfl:  .  traMADol (ULTRAM) 50 MG tablet, Take 1 tablet (50 mg total) by mouth every 6 (six) hours as needed. (Patient taking differently: Take 50 mg by mouth every 6 (six) hours as needed for moderate pain. ), Disp: 60 tablet, Rfl: 5 .  valACYclovir (VALTREX) 500 MG tablet, Take 2,000 mg by mouth See admin instructions. Take 2000mg s at first sign of fever blister outbreak then take 500mg s daily until gone, Disp: , Rfl:    Allergies:  Aspirin; Penicillins; Eggs or egg-derived products; and Vicodin [hydrocodone-acetaminophen]  Review of Systems:  Constitutional: Feels well. Cardiovascular: Denies chest pain, exertional chest pain.  Pulmonary: Denies hemoptysis, pleuritic chest pain.   The remainder of systems were reviewed and were found to be negative other than what is documented in the HPI.    Physical Examination:   VS: BP 110/68 (BP Location: Left Arm, Cuff Size: Normal)   Pulse 92   Resp 16   Ht 5\' 5"  (1.651 m)   Wt 139 lb (63 kg)   SpO2 94%   BMI 23.13 kg/m   General Appearance: appears anxious.  Neuro:without focal findings, mental status, speech normal, alert and oriented HEENT: PERRLA, EOM intact Pulmonary: No wheezing, No rales, lungs sound normal.  CardiovascularNormal S1,S2.  No m/r/g.  Abdomen: Benign, Soft, non-tender, No masses Renal:  No costovertebral tenderness  GU:  No performed at this time. Endoc: No evident  thyromegaly, no signs of acromegaly or Cushing features Skin:   warm, no rashes, no ecchymosis  Extremities: normal, no cyanosis, clubbing.      LABORATORY PANEL:   CBC No results for input(s): WBC, HGB, HCT, PLT in the last 168 hours. ------------------------------------------------------------------------------------------------------------------  Chemistries  No results for input(s): NA, K, CL, CO2, GLUCOSE, BUN, CREATININE, CALCIUM, MG, AST, ALT, ALKPHOS, BILITOT in the last 168 hours.  Invalid input(s): GFRCGP ------------------------------------------------------------------------------------------------------------------  Cardiac Enzymes No results for input(s): TROPONINI in the last 168 hours. ------------------------------------------------------------  RADIOLOGY:  No results found.     Thank  you for the consultation and for allowing [facility] to assist in the care of your patient. Our recommendations are noted above.  Please contact us if we can be of further service.   Wells Guiles, M.D., F.C.C.P.  Board Certified in Internal Medicine, Pulmonary Medicine, Critical Care Medicine, and Sleep Medicine.  [facility] Office Number: [phone]   05/21/2018

## 2018-05-21 NOTE — Patient Instructions (Addendum)
Will check blood work.  After drawing blood work will need to start singulair.  Will need to get allergy testing result from your previous allergist.  Continue protonix.  Will prescribe nebulizer with albuterol to use as needed.

## 2018-05-22 ENCOUNTER — Telehealth: Payer: Self-pay | Admitting: Internal Medicine

## 2018-05-22 NOTE — Telephone Encounter (Signed)
Called and spoke with patient regarding phone note. Notified her that we will not start the singulair until Dr. Ardyth Man gets her lab results, then depending on those results we will start singulair. She is aware. Will confirm with Dr. Ardyth Man that we can write work note for Thursday and Friday. She is aware, no further questions at this time.

## 2018-05-23 ENCOUNTER — Encounter: Payer: Self-pay | Admitting: *Deleted

## 2018-05-23 MED ORDER — FLUTICASONE-UMECLIDIN-VILANT 100-62.5-25 MCG/INH IN AEPB: 1.0000 | INHALATION_SPRAY | Freq: Every day | RESPIRATORY_TRACT | 0 refills | Status: DC

## 2018-05-23 NOTE — Addendum Note (Signed)
Addended by: Janean Sark on: 05/23/2018 03:30 PM   Modules accepted: Orders

## 2018-05-27 ENCOUNTER — Other Ambulatory Visit (HOSPITAL_COMMUNITY)
Admission: RE | Admit: 2018-05-27 | Discharge: 2018-05-27 | Disposition: A | Discharge status: Home / Self Care | Payer: 59 | Source: Ambulatory Visit | Attending: Internal Medicine | Admitting: Internal Medicine

## 2018-05-27 DIAGNOSIS — J4551 Severe persistent asthma with (acute) exacerbation: Secondary | ICD-10-CM | POA: Insufficient documentation

## 2018-05-27 LAB — CBC WITH DIFFERENTIAL/PLATELET
Abs Immature Granulocytes: 0.1 10*3/uL — ABNORMAL HIGH (ref 0.00–0.07)
Basophils Absolute: 0.1 10*3/uL (ref 0.0–0.1)
Basophils Relative: 1 %
Eosinophils Absolute: 0.8 10*3/uL — ABNORMAL HIGH (ref 0.0–0.5)
Eosinophils Relative: 8 %
HCT: 38.5 % (ref 36.0–46.0)
Hemoglobin: 11.7 g/dL — ABNORMAL LOW (ref 12.0–15.0)
Immature Granulocytes: 1 %
Lymphocytes Relative: 31 %
Lymphs Abs: 3 10*3/uL (ref 0.7–4.0)
MCH: 27.9 pg (ref 26.0–34.0)
MCHC: 30.4 g/dL (ref 30.0–36.0)
MCV: 91.7 fL (ref 80.0–100.0)
Monocytes Absolute: 0.8 10*3/uL (ref 0.1–1.0)
Monocytes Relative: 8 %
Neutro Abs: 4.9 10*3/uL (ref 1.7–7.7)
Neutrophils Relative %: 51 %
Platelets: 317 10*3/uL (ref 150–400)
RBC: 4.2 MIL/uL (ref 3.87–5.11)
RDW: 12.5 % (ref 11.5–15.5)
WBC: 9.7 10*3/uL (ref 4.0–10.5)
nRBC: 0 % (ref 0.0–0.2)

## 2018-05-29 ENCOUNTER — Telehealth: Payer: Self-pay | Admitting: Internal Medicine

## 2018-05-29 LAB — IGE: IgE (Immunoglobulin E), Serum: 3590 IU/mL — ABNORMAL HIGH (ref 6–495)

## 2018-05-29 NOTE — Telephone Encounter (Signed)
Message routed to [facility] triage to update or close encounter.

## 2018-05-29 NOTE — Telephone Encounter (Signed)
It is ok to try Trelegy for asthma, particularly since she has tried multiple other inhaler types without success. Hopefully we can back off on this once she does better.  Cough is a normal side effect of any powder inhaler. As long as its not too bad she can continue.

## 2018-05-29 NOTE — Telephone Encounter (Signed)
Pt aware.

## 2018-06-05 ENCOUNTER — Telehealth: Payer: Self-pay | Admitting: Internal Medicine

## 2018-06-05 NOTE — Telephone Encounter (Signed)
Suggest that allergies are causing her breathing issues. Continue current medications and will reassess at next visit.

## 2018-06-05 NOTE — Telephone Encounter (Signed)
Pt states she is not using Trelegy. It makes her cough for about an hour after use. She is using nebs bid. She wants to knoiw if singulair can now be called in?

## 2018-06-06 ENCOUNTER — Other Ambulatory Visit: Payer: Self-pay | Admitting: Internal Medicine

## 2018-06-06 MED ORDER — MONTELUKAST SODIUM 10 MG PO TABS: 10.0000 mg | ORAL_TABLET | Freq: Every day | ORAL | 3 refills | Status: DC

## 2018-06-06 NOTE — Telephone Encounter (Signed)
Sent script for singulair to her pharmacy.

## 2018-06-09 NOTE — Telephone Encounter (Signed)
Pt aware rx was being sent.

## 2018-06-13 ENCOUNTER — Other Ambulatory Visit: Payer: Self-pay | Admitting: Internal Medicine

## 2018-06-24 ENCOUNTER — Ambulatory Visit: Payer: 59 | Admitting: Internal Medicine

## 2018-06-25 ENCOUNTER — Ambulatory Visit: Payer: 59 | Admitting: Allergy & Immunology

## 2018-06-25 ENCOUNTER — Encounter: Payer: Self-pay | Admitting: Allergy & Immunology

## 2018-06-25 ENCOUNTER — Other Ambulatory Visit: Payer: Self-pay

## 2018-06-25 VITALS — BP 98/68 | HR 74 | Temp 98.1°F | Resp 18 | Ht 64.5 in | Wt 136.0 lb

## 2018-06-25 DIAGNOSIS — J302 Other seasonal allergic rhinitis: Secondary | ICD-10-CM | POA: Diagnosis not present

## 2018-06-25 DIAGNOSIS — J449 Chronic obstructive pulmonary disease, unspecified: Secondary | ICD-10-CM

## 2018-06-25 DIAGNOSIS — J3089 Other allergic rhinitis: Secondary | ICD-10-CM

## 2018-06-25 MED ORDER — AZELASTINE HCL 0.1 % NA SOLN: 2.0000 | Freq: Two times a day (BID) | NASAL | 5 refills | Status: DC | PRN

## 2018-06-25 NOTE — Addendum Note (Signed)
Addended by: Mliss Fritz I on: 06/25/2018 11:11 AM   Modules accepted: Orders

## 2018-06-25 NOTE — Patient Instructions (Addendum)
1. Asthma-COPD overlap syndrome - Deferred lung testing since you were having such a hard time with your coughing this morning. - Continue with Yupelri daily in combination with albuterol three times daily. - Additional Yupelri samples provided today. - Dr. Juanetta Gosling is doing a good job with managing your breathing.   2. Chronic rhinitis - Testing today showed: horse, tobacco leaf, grasses, ragweed, trees, indoor molds, outdoor molds, dust mites, cat and dog - Copy of test results provided.  - Avoidance measures provided. - Continue with: Zyrtec (cetirizine)  tablet once daily, Singulair (montelukast)  daily and Nasacort (triamcinolone) two sprays per nostril daily - Start taking: Astelin (azelastine) 2 sprays per nostril 1-2 times daily as needed - You can use an extra dose of the antihistamine, if needed, for breakthrough symptoms.  - Consider nasal saline rinses 1-2 times daily to remove allergens from the nasal cavities as well as help with mucous clearance (this is especially helpful to do before the nasal sprays are given) - Consider allergy shots as a means of long-term control once you have better insurance coverage. - Allergy shots "re-train" and "reset" the immune system to ignore environmental allergens and decrease the resulting immune response to those allergens (sneezing, itchy watery eyes, runny nose, nasal congestion, etc).    - Allergy shots can also help with allergic asthma symptoms.  - Allergy shots improve symptoms in 75-85% of patients.   3. Return in about 6 months (around 12/26/2018).   Please inform us of any Emergency Department visits, hospitalizations, or changes in symptoms. Call us before going to the ED for breathing or allergy symptoms since we might be able to fit you in for a sick visit. Feel free to contact us anytime with any questions, problems, or concerns.  It was a pleasure to meet you today!  Websites that have reliable patient information: 1.  American Academy of Asthma, Allergy, and Immunology: www.aaaai.org 2. Food Allergy Research and Education (FARE): foodallergy.org 3. Mothers of Asthmatics: http://www.asthmacommunitynetwork.org 4. American College of Allergy, Asthma, and Immunology: www.acaai.org  "Like" [place] on Facebook and Instagram for our latest updates!      Make sure you are registered to vote! If you have moved or changed any of your contact information, you will need to get this updated before voting!    Voter ID laws are NOT going into effect for the General Election in November 2020! DO NOT let this stop you from exercising your right to vote!   Reducing Pollen Exposure  The American Academy of Allergy, Asthma and Immunology suggests the following steps to reduce your exposure to pollen during allergy seasons.    1. Do not hang sheets or clothing out to dry; pollen may collect on these items. 2. Do not mow lawns or spend time around freshly cut grass; mowing stirs up pollen. 3. Keep windows closed at night.  Keep car windows closed while driving. 4. Minimize morning activities outdoors, a time when pollen counts are usually at their highest. 5. Stay indoors as much as possible when pollen counts or humidity is high and on windy days when pollen tends to remain in the air longer. 6. Use air conditioning when possible.  Many air conditioners have filters that trap the pollen spores. 7. Use a HEPA room air filter to remove pollen form the indoor air you breathe.  Control of Mold Allergen   Mold and fungi can grow on a variety of surfaces provided certain temperature and moisture conditions exist.  Outdoor  molds grow on plants, decaying vegetation and soil.  The major outdoor mold, Alternaria and Cladosporium, are found in very high numbers during hot and dry conditions.  Generally, a late Summer - Fall peak is seen for common outdoor fungal spores.  Rain will temporarily lower outdoor mold spore count, but counts  rise rapidly when the rainy period ends.  The most important indoor molds are Aspergillus and Penicillium.  Dark, humid and poorly ventilated basements are ideal sites for mold growth.  The next most common sites of mold growth are the bathroom and the kitchen.  Outdoor (Seasonal) Mold Control  Positive outdoor molds via skin testing: Bipolaris (Helminthsporium), Drechslera (Curvalaria) and Mucor  1. Use air conditioning and keep windows closed 2. Avoid exposure to decaying vegetation. 3. Avoid leaf raking. 4. Avoid grain handling. 5. Consider wearing a face mask if working in moldy areas.  6.   Indoor (Perennial) Mold Control   Positive indoor molds via skin testing: Penicillium, Fusarium, Aureobasidium (Pullulara), Rhizopus, Botrytis and Phoma  1. Maintain humidity below 50%. 2. Clean washable surfaces with 5% bleach solution. 3. Remove sources e.g. contaminated carpets.     Control of House Dust Mite Allergen    House dust mites play a major role in allergic asthma and rhinitis.  They occur in environments with high humidity wherever human skin, the food for dust mites is found. High levels have been detected in dust obtained from mattresses, pillows, carpets, upholstered furniture, bed covers, clothes and soft toys.  The principal allergen of the house dust mite is found in its feces.  A gram of dust may contain 1,000 mites and 250,000 fecal particles.  Mite antigen is easily measured in the air during house cleaning activities.    1. Encase mattresses, including the box spring, and pillow, in an air tight cover.  Seal the zipper end of the encased mattresses with wide adhesive tape. 2. Wash the bedding in water of 130 degrees Farenheit weekly.  Avoid cotton comforters/quilts and flannel bedding: the most ideal bed covering is the dacron comforter. 3. Remove all upholstered furniture from the bedroom. 4. Remove carpets, carpet padding, rugs, and non-washable window drapes from  the bedroom.  Wash drapes weekly or use plastic window coverings. 5. Remove all non-washable stuffed toys from the bedroom.  Wash stuffed toys weekly. 6. Have the room cleaned frequently with a vacuum cleaner and a damp dust-mop.  The patient should not be in a room which is being cleaned and should wait 1 hour after cleaning before going into the room. 7. Close and seal all heating outlets in the bedroom.  Otherwise, the room will become filled with dust-laden air.  An electric heater can be used to heat the room. 8. Reduce indoor humidity to less than 50%.  Do not use a humidifier.  Control of Dog or Cat Allergen  Avoidance is the best way to manage a dog or cat allergy. If you have a dog or cat and are allergic to dog or cats, consider removing the dog or cat from the home. If you have a dog or cat but don't want to find it a new home, or if your family wants a pet even though someone in the household is allergic, here are some strategies that may help keep symptoms at bay:  1. Keep the pet out of your bedroom and restrict it to only a few rooms. Be advised that keeping the dog or cat in only one room will not  limit the allergens to that room. 2. Don't pet, hug or kiss the dog or cat; if you do, wash your hands with soap and water. 3. High-efficiency particulate air (HEPA) cleaners run continuously in a bedroom or living room can reduce allergen levels over time. 4. Regular use of a high-efficiency vacuum cleaner or a central vacuum can reduce allergen levels. 5. Giving your dog or cat a bath at least once a week can reduce airborne allergen.  Allergy Shots   Allergies are the result of a chain reaction that starts in the immune system. Your immune system controls how your body defends itself. For instance, if you have an allergy to pollen, your immune system identifies pollen as an invader or allergen. Your immune system overreacts by producing antibodies called Immunoglobulin E (IgE). These  antibodies travel to cells that release chemicals, causing an allergic reaction.  The concept behind allergy immunotherapy, whether it is received in the form of shots or tablets, is that the immune system can be desensitized to specific allergens that trigger allergy symptoms. Although it requires time and patience, the payback can be long-term relief.  How Do Allergy Shots Work?  Allergy shots work much like a vaccine. Your body responds to injected amounts of a particular allergen given in increasing doses, eventually developing a resistance and tolerance to it. Allergy shots can lead to decreased, minimal or no allergy symptoms.  There generally are two phases: build-up and maintenance. Build-up often ranges from three to six months and involves receiving injections with increasing amounts of the allergens. The shots are typically given once or twice a week, though more rapid build-up schedules are sometimes used.  The maintenance phase begins when the most effective dose is reached. This dose is different for each person, depending on how allergic you are and your response to the build-up injections. Once the maintenance dose is reached, there are longer periods between injections, typically two to four weeks.  Occasionally doctors give cortisone-type shots that can temporarily reduce allergy symptoms. These types of shots are different and should not be confused with allergy immunotherapy shots.  Who Can Be Treated with Allergy Shots?  Allergy shots may be a good treatment approach for people with allergic rhinitis (hay fever), allergic asthma, conjunctivitis (eye allergy) or stinging insect allergy.   Before deciding to begin allergy shots, you should consider:  . The length of allergy season and the severity of your symptoms . Whether medications and/or changes to your environment can control your symptoms . Your desire to avoid long-term medication use . Time: allergy immunotherapy  requires a major time commitment . Cost: may vary depending on your insurance coverage  Allergy shots for children age 41 and older are effective and often well tolerated. They might prevent the onset of new allergen sensitivities or the progression to asthma.  Allergy shots are not started on patients who are pregnant but can be continued on patients who become pregnant while receiving them. In some patients with other medical conditions or who take certain common medications, allergy shots may be of risk. It is important to mention other medications you talk to your allergist.   When Will I Feel Better?  Some may experience decreased allergy symptoms during the build-up phase. For others, it may take as long as 12 months on the maintenance dose. If there is no improvement after a year of maintenance, your allergist will discuss other treatment options with you.  If you aren't responding to allergy shots,  it may be because there is not enough dose of the allergen in your vaccine or there are missing allergens that were not identified during your allergy testing. Other reasons could be that there are high levels of the allergen in your environment or major exposure to non-allergic triggers like tobacco smoke.  What Is the Length of Treatment?  Once the maintenance dose is reached, allergy shots are generally continued for three to five years. The decision to stop should be discussed with your allergist at that time. Some people may experience a permanent reduction of allergy symptoms. Others may relapse and a longer course of allergy shots can be considered.  What Are the Possible Reactions?  The two types of adverse reactions that can occur with allergy shots are local and systemic. Common local reactions include very mild redness and swelling at the injection site, which can happen immediately or several hours after. A systemic reaction, which is less common, affects the entire body or a  particular body system. They are usually mild and typically respond quickly to medications. Signs include increased allergy symptoms such as sneezing, a stuffy nose or hives.  Rarely, a serious systemic reaction called anaphylaxis can develop. Symptoms include swelling in the throat, wheezing, a feeling of tightness in the chest, nausea or dizziness. Most serious systemic reactions develop within 30 minutes of allergy shots. This is why it is strongly recommended you wait in your doctor's office for 30 minutes after your injections. Your allergist is trained to watch for reactions, and his or her staff is trained and equipped with the proper medications to identify and treat them.  Who Should Administer Allergy Shots?  The preferred location for receiving shots is your prescribing allergist's office. Injections can sometimes be given at another facility where the physician and staff are trained to recognize and treat reactions, and have received instructions by your prescribing allergist.

## 2018-06-25 NOTE — Progress Notes (Signed)
NEW PATIENT  Date of Service/Encounter:  06/25/18  Referring provider: Benita Stabile, MD   Assessment:   Asthma-COPD overlap syndrome  Seasonal and perennial allergic rhinitis (horse, tobacco leaf, grasses, ragweed, trees, indoor molds, outdoor molds, dust mites, cat and dog)  Becoming uninsured next week   [name] has a longstanding history of COPD, which appears controlled only with Yupelri and albuterol.  She has failed multiple other controller medications.  In addition, she has a history of elevated eosinophils, with her most recent absolute eosinophil count of 800 earlier this year.  Unfortunately, she is going to be losing her insurance this next week which does complicate things somewhat.  I think we can go ahead and submit for Fasenra, and hopefully there is some foundation support that could be helpful for her.  I think this would help decrease her exacerbations and improve her quality of life.  In addition, she has multiple allergic sensitizations, all of which could be contributing to her current pulmonary status.  We did provide avoidance measures.  I think that the addition of an air purifier in the bedroom at least could help decrease the exposure to dog dander.  Both of her dogs are elderly and unlikely to peacefully be removed from the bedroom.  So this would be a good compromise.  In addition, we are going to continue with cetirizine and montelukast since these are efficacious and inexpensive.  We are going to continue with Nasacort.  We did give her several samples of Nasacort today.  While she has insurance, I recommended that she fill her Astelin nasal spray to use as needed during the worst times of the year.  In the future when she has better insurance, I think she would make an excellent immunotherapy candidate.  Plan/Recommendations:   1. Asthma-COPD overlap syndrome - Deferred lung testing since you were having such a hard time with your coughing this  morning. - Continue with Yupelri daily in combination with albuterol three times daily. - Additional Yupelri samples provided today. - Dr. Juanetta Gosling is doing a good job with managing your breathing.   2. Chronic rhinitis - Testing today showed: horse, tobacco leaf, grasses, ragweed, trees, indoor molds, outdoor molds, dust mites, cat and dog - Copy of test results provided.  - Avoidance measures provided. - Continue with: Zyrtec (cetirizine) 10mg  tablet once daily, Singulair (montelukast) 10mg  daily and Nasacort (triamcinolone) two sprays per nostril daily - Start taking: Astelin (azelastine) 2 sprays per nostril 1-2 times daily as needed - You can use an extra dose of the antihistamine, if needed, for breakthrough symptoms.  - Consider nasal saline rinses 1-2 times daily to remove allergens from the nasal cavities as well as help with mucous clearance (this is especially helpful to do before the nasal sprays are given) - Consider allergy shots as a means of long-term control once you have better insurance coverage. - Allergy shots "re-train" and "reset" the immune system to ignore environmental allergens and decrease the resulting immune response to those allergens (sneezing, itchy watery eyes, runny nose, nasal congestion, etc).    - Allergy shots can also help with allergic asthma symptoms.  - Allergy shots improve symptoms in 75-85% of patients.   3. Return in about 6 months (around 12/26/2018).   Subjective:   [name] is a 53 y.o. female presenting today for evaluation of  Chief Complaint  Patient presents with   Cough    IgE level was 3000+, elevated eosinophils.  Asthma   COPD   Allergic Rhinitis     [name] has a history of the following: Patient Active Problem List   Diagnosis Date Noted   Spinal stenosis of cervical region 04/05/2017   Partial tear of right subscapularis tendon    Incisional hernia, without obstruction or gangrene     Urinary frequency 03/21/2012   Migraine equivalent syndrome 03/21/2012   Insomnia 03/21/2012    History obtained from: chart review and patient.  [name] was referred by Benita Stabile, MD.     [name] is a 53 y.o. female presenting for an evaluation of environmental allergies.   Asthma/Respiratory Symptom History: She has a longstanding history of COPD. Over the years, she has been on Advair, Symbicort, Kaka, Spiriva, and Time.  She is followed in pulmonology by Dr. Nicholos Johns.  She last saw him in February 2020.  She has had full pulmonary function testing which showed borderline obstructive disease and reduced DLCO of 46%.  At her last visit, she was started on Trelegy 1 puff daily.  She had blood work performed that showed an elevated eosinophil count of 800.  Her total IgE level was 3590.  She was started on montelukast at that visit as well.  Her last chest x-ray was in January 2020 that showed slightly elevated right diaphragm with chronic bronchitis changes but otherwise normal.   Allergic Rhinitis Symptom History: She reports that she has problems with allergic rhinitis symptoms. She has chronic rhinitis as well as sneezing and runny nose. She is currently on cetirizine as well as montelukast. She does have Nasacort to use when things get particularly bad. She was tested for environmental allergens by Dr. Willa Rough in 2011 or so. She does not have the previous testing results but thinks that she was positive to "the entire panel".  Otherwise, there is no history of other atopic diseases, including food allergies, drug allergies, stinging insect allergies, eczema, urticaria or contact dermatitis. There is no significant infectious history. Vaccinations are up to date.    Past Medical History: Patient Active Problem List   Diagnosis Date Noted   Spinal stenosis of cervical region 04/05/2017   Partial tear of right subscapularis tendon    Incisional hernia, without  obstruction or gangrene    Urinary frequency 03/21/2012   Migraine equivalent syndrome 03/21/2012   Insomnia 03/21/2012    Medication List:  Allergies as of 06/25/2018      Reactions   Aspirin Shortness Of Breath   Causes asthma flares    Penicillins Other (See Comments)   UNSPECIFIED REACTION OF CHILDHOOD Has patient had a PCN reaction causing immediate rash, facial/tongue/throat swelling, SOB or lightheadedness with hypotension: NO Has patient had a PCN reaction causing severe rash involving mucus membranes or skin necrosis: NO Has patient had a PCN reaction that required hospitalization: no Has patient had a PCN reaction occurring within the last 10 years: NO If all of the above answers are "NO", then may proceed with Cephalosporin use.   Eggs Or Egg-derived Products Diarrhea, Other (See Comments)   BLOATING > GAS   Vicodin [hydrocodone-acetaminophen] Other (See Comments)   Headaches       Medication List       Accurate as of June 25, 2018 10:53 AM. Always use your most recent med list.        acetaminophen 500 MG tablet Commonly known as:  TYLENOL Take 1,000 mg by mouth every 6 (six) hours as needed for moderate pain.  aspirin-acetaminophen-caffeine 250-250-65 MG tablet Commonly known as:  EXCEDRIN MIGRAINE Take 2 tablets by mouth daily as needed for headache or migraine.   bismuth subsalicylate 262 MG chewable tablet Commonly known as:  PEPTO BISMOL Chew 524 mg by mouth as needed for indigestion.   cetirizine 10 MG tablet Commonly known as:  ZYRTEC Take 10 mg by mouth daily.   citalopram 20 MG tablet Commonly known as:  CELEXA   docusate sodium 100 MG capsule Commonly known as:  COLACE Take 100 mg by mouth daily.   levothyroxine 75 MCG tablet Commonly known as:  SYNTHROID, LEVOTHROID Take 75 mcg by mouth daily before breakfast.   LORazepam 0.5 MG tablet Commonly known as:  Ativan Take 1 tablet (0.5 mg total) by mouth at bedtime as needed for  anxiety.   montelukast 10 MG tablet Commonly known as:  SINGULAIR Take 1 tablet (10 mg total) by mouth at bedtime.   Myrbetriq 25 MG Tb24 tablet Generic drug:  mirabegron ER TAKE 1 TABLET BY MOUTH EVERYDAY AT BEDTIME   pantoprazole 40 MG tablet Commonly known as:  Protonix Take 1 tablet (40 mg total) by mouth daily.   pravastatin 10 MG tablet Commonly known as:  PRAVACHOL   SUMAtriptan 6 MG/0.5ML Soaj Inject 6 mg as directed daily as needed (migraine).   traMADol 50 MG tablet Commonly known as:  ULTRAM Take 1 tablet (50 mg total) by mouth every 6 (six) hours as needed.   valACYclovir 500 MG tablet Commonly known as:  VALTREX Take 2,000 mg by mouth See admin instructions. Take s at first sign of fever blister outbreak then take s daily until gone   Ventolin HFA 108 (90 Base) MCG/ACT inhaler Generic drug:  albuterol Inhale 2 puffs into the lungs every 4 (four) hours as needed for shortness of breath.   albuterol (2.5 MG/3ML) 0.083% nebulizer solution Commonly known as:  PROVENTIL Take 3 mLs (2.5 mg total) by nebulization every 6 (six) hours as needed for wheezing or shortness of breath. DX:[id number]   Yupelri 175 MCG/3ML Soln Generic drug:  Revefenacin Inhale 1 vial into the lungs daily.       Birth History: non-contributory  Developmental History: non-contributory.   Past Surgical History: Past Surgical History:  Procedure Laterality Date   ABDOMINAL HYSTERECTOMY     with bladder suspension   CERVICAL FUSION     CHOLECYSTECTOMY     COLONOSCOPY N/A 07/30/2013   Procedure: COLONOSCOPY;  Surgeon: Malissa Hippo, MD;  Location: [facility];  Service: Endoscopy;  Laterality: N/A;  930   EXAM UNDER ANESTHESIA WITH MANIPULATION OF SHOULDER Right 10/11/2016   Procedure: EXAM UNDER ANESTHESIA WITH MANIPULATION OF SHOULDER;  Surgeon: Vickki Hearing, MD;  Location: [facility];  Service: Orthopedics;  Laterality: Right;   INCISIONAL HERNIA REPAIR N/A  07/30/2016   Procedure: HERNIA REPAIR INCISIONAL WITH MESH;  Surgeon: [name], MD;  Location: [facility];  Service: General;  Laterality: N/A;   LOBECTOMY     right lung    POSTERIOR CERVICAL LAMINECTOMY Right 04/05/2017   Procedure: Right C6-7 Posterior cervical laminectomy;  Surgeon: Donalee Citrin, MD;  Location: [facility] OR;  Service: Neurosurgery;  Laterality: Right;  Right C6-7 Posterior cervical laminectomy   SHOULDER ARTHROSCOPY WITH ROTATOR CUFF REPAIR Right 10/11/2016   Procedure: SHOULDER ARTHROSCOPY;  Surgeon: Vickki Hearing, MD;  Location: [facility];  Service: Orthopedics;  Laterality: Right;   TUBAL LIGATION       Family History: Family History  Problem Relation  Age of Onset   COPD Mother    Diabetes Mother    Hyperlipidemia Mother    Hypertension Mother    Bipolar disorder Son    Heart failure Maternal Grandmother    Hypertension Maternal Grandmother    Thyroid disease Maternal Grandmother    Diabetes Paternal Grandmother    Alzheimer's disease Paternal Grandmother    Heart failure Paternal Grandfather    Hypertension Paternal Grandfather    Hypertension Father    Colon cancer Neg Hx      Social History: [name] lives at home with her husband and two dogs.  They live in a house that is very old.  There are hardwoods and rugs throughout the home.  They have gas heating and window units for cooling.  There are no dust mite covers on the bedding.  She does have tobacco exposure in the car (her husband smokes).  She smoked from 1983 2019.  She currently works as a Occupational psychologist for the past 8 months.  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  While she was there, she was temporarily disabled due to a neck injury.  She is currently still insured by them, but she is losing her insurance on Sunday, March 22.  The insurance for  her current job seems catastrophic, with copayments of $150.   Review of Systems  Constitutional: Negative.  Negative for fever, malaise/fatigue and weight loss.  HENT: Negative.  Negative for congestion, ear discharge and ear pain.   Eyes: Negative for pain, discharge and redness.  Respiratory: Positive for cough and shortness of breath. Negative for sputum production and wheezing.   Cardiovascular: Negative.  Negative for chest pain and palpitations.  Gastrointestinal: Negative for abdominal pain and heartburn.  Skin: Negative.  Negative for itching and rash.  Neurological: Negative for dizziness and headaches.  Endo/Heme/Allergies: Negative for environmental allergies. Does not bruise/bleed easily.       Objective:   Blood pressure 98/68, pulse 74, temperature 98.1 F (36.7 C), temperature source Oral, resp. rate 18, height 5' 4.5" (1.638 m), weight 136 lb (61.7 kg), SpO2 96 %. Body mass index is 22.98 kg/m.   Physical Exam:   Physical Exam  Constitutional: She appears well-developed.  HENT:  Head: Normocephalic and atraumatic.  Right Ear: Tympanic membrane, external ear and ear canal normal. No drainage, swelling or tenderness. Tympanic membrane is not injected, not scarred, not erythematous, not retracted and not bulging.  Left Ear: Tympanic membrane, external ear and ear canal normal. No drainage, swelling or tenderness. Tympanic membrane is not injected, not scarred, not erythematous, not retracted and not bulging.  Nose: No mucosal edema, rhinorrhea, nasal deformity or septal deviation. No epistaxis. Right sinus exhibits no maxillary sinus tenderness and no frontal sinus tenderness. Left sinus exhibits no maxillary sinus tenderness and no frontal sinus tenderness.  Mouth/Throat: Uvula is midline and oropharynx is clear and moist. Mucous membranes are not pale and not dry.  Eyes: Pupils are equal, round, and reactive to light. Conjunctivae and EOM are normal. Right eye  exhibits no chemosis and no discharge. Left eye exhibits no chemosis and no discharge. Right conjunctiva is not injected. Left conjunctiva is not injected.  Cardiovascular: Normal rate, regular rhythm and normal heart sounds.  Respiratory: Effort normal and breath sounds normal. No accessory muscle usage. No tachypnea. No respiratory distress. She has  no wheezes. She has no rhonchi. She has no rales. She exhibits no tenderness.  GI: There is no abdominal tenderness. There is no rebound and no guarding.  Lymphadenopathy:       Head (right side): No submandibular, no tonsillar and no occipital adenopathy present.       Head (left side): No submandibular, no tonsillar and no occipital adenopathy present.    She has no cervical adenopathy.  Neurological: She is alert.  Skin: No abrasion, no petechiae and no rash noted. Rash is not papular, not vesicular and not urticarial. No erythema. No pallor.  Psychiatric: She has a normal mood and affect.     Diagnostic studies:     Allergy Studies:    Airborne Adult Perc - 06/25/18 0930    Time Antigen Placed  0940    Allergen Manufacturer  Waynette Buttery    Location  Back    Number of Test  59    Panel 1  Select    1. Control-Buffer 50% Glycerol  Negative    2. Control-Histamine 1 mg/ml  2+    3. Albumin saline  Negative    4. Bahia  Negative    5. [place]  Negative    6. Johnson  Negative    7. Kentucky Blue  2+    8. Meadow Fescue  2+    9. Perennial Rye  2+    10. Sweet Vernal  2+    11. Timothy  2+    12. Cocklebur  Negative    13. Burweed Marshelder  Negative    14. Ragweed, short  Negative    15. Ragweed, Giant  Negative    16. Plantain,  English  Negative    17. Lamb's Quarters  Negative    18. Sheep Sorrell  Negative    19. Rough Pigweed  Negative    20. Marsh Elder, Rough  Negative    21. Mugwort, Common  Negative    22. Ash mix  Negative    23. Birch mix  Negative    24. Beech American  Negative    25. Box, Elder  Negative    26.  Cedar, red  Negative    27. Cottonwood, Guinea-Bissau  Negative    28. Elm mix  Negative    29. Hickory mix  Negative    30. Maple mix  Negative    31. Oak, Guinea-Bissau mix  Negative    32. Pecan Pollen  Negative    33. Pine mix  Negative    34. Sycamore Eastern  Negative    35. Walnut, Black Pollen  Negative    36. Alternaria alternata  Negative    37. Cladosporium Herbarum  Negative    38. Aspergillus mix  Negative    39. Penicillium mix  3+    40. Bipolaris sorokiniana (Helminthosporium)  2+    41. Drechslera spicifera (Curvularia)  2+    42. Mucor plumbeus  2+    43. Fusarium moniliforme  2+    44. Aureobasidium pullulans (pullulara)  2+    45. Rhizopus oryzae  2+    46. Botrytis cinera  2+    47. Epicoccum nigrum  Negative    48. Phoma betae  2+    49. Candida Albicans  Negative    50. Trichophyton mentagrophytes  Negative    51. Mite, D Farinae  5,000 AU/ml  4+    52. Mite, D Pteronyssinus  5,000 AU/ml  4+  53. Cat Hair 10,000 BAU/ml  3+    54.  Dog Epithelia  3+    55. Mixed Feathers  Negative    56. Horse Epithelia  2+    57. Cockroach, German  Negative    58. Mouse  3+    59. Tobacco Leaf  2+     Intradermal - 06/25/18 1039    Time Antigen Placed  1015    Allergen Manufacturer  Waynette Buttery    Location  Arm    Intradermal  Select    Control  Negative    Ragweed mix  3+    Weed mix  Negative    Tree mix  3+    Mold 1  Negative    Cockroach  Negative       Allergy testing results were read and interpreted by myself, documented by clinical staff.         Malachi Bonds, MD [facility]

## 2018-08-28 ENCOUNTER — Other Ambulatory Visit: Payer: Self-pay

## 2018-08-28 ENCOUNTER — Other Ambulatory Visit (HOSPITAL_COMMUNITY): Payer: Self-pay | Admitting: Pulmonary Disease

## 2018-08-28 ENCOUNTER — Ambulatory Visit (HOSPITAL_COMMUNITY)
Admission: RE | Admit: 2018-08-28 | Discharge: 2018-08-28 | Disposition: A | Discharge status: Home / Self Care | Payer: Self-pay | Source: Ambulatory Visit | Attending: Pulmonary Disease | Admitting: Pulmonary Disease

## 2018-08-28 DIAGNOSIS — J4541 Moderate persistent asthma with (acute) exacerbation: Secondary | ICD-10-CM

## 2018-09-18 ENCOUNTER — Telehealth: Payer: Self-pay

## 2018-09-18 NOTE — Telephone Encounter (Signed)
Left message for patient

## 2018-09-18 NOTE — Telephone Encounter (Signed)
-----   Message from Laverle Hobby, MD sent at 09/18/2018  4:40 PM EDT ----- Regarding: needs OV  Pt never followed up for asthma, pls scheduled appt.  ----- Message ----- From: Buel Ream, [facility] [place] Sent: 05/27/2018   8:32 AM EDT To: Laverle Hobby, MD

## 2018-09-19 NOTE — Telephone Encounter (Signed)
Left message x2 for pt. 

## 2018-09-22 NOTE — Telephone Encounter (Addendum)
Left message x3 for pt.  Letter has been mailed to address on file. Will close encounter per office protocol.

## 2018-11-13 ENCOUNTER — Other Ambulatory Visit: Payer: Self-pay

## 2018-11-13 DIAGNOSIS — Z20822 Contact with and (suspected) exposure to covid-19: Secondary | ICD-10-CM

## 2018-11-14 LAB — NOVEL CORONAVIRUS, NAA: SARS-CoV-2, NAA: NOT DETECTED

## 2018-12-31 IMAGING — MR MR SHOULDER*R* W/O CM
4 of 5 series · 21 of 40 positions shown · non-contrast
Comparison: MRI right shoulder 06/04/2016. Plain films right
shoulder 04/18/2017.

CLINICAL DATA: Right shoulder pain for 2 weeks.  No known injury.

EXAM:
MRI OF THE RIGHT SHOULDER WITHOUT CONTRAST
TECHNIQUE: Multiplanar, multisequence MR imaging of the shoulder was performed.
No intravenous contrast was administered.

[Series 3: t2fs axial · axial · 3.0mm · 0.27mm/px · z∈[-1,+41]mm · 3 of 20 slices shown]
[im 4/20]
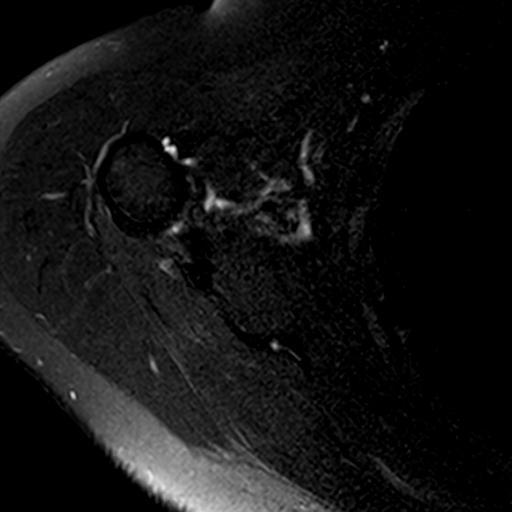
[im 10/20]
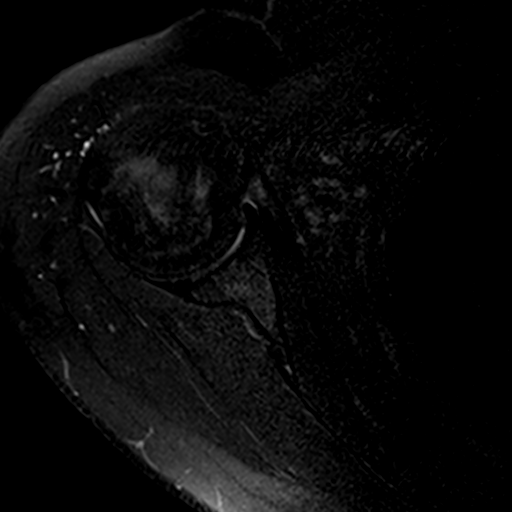
[im 16/20]
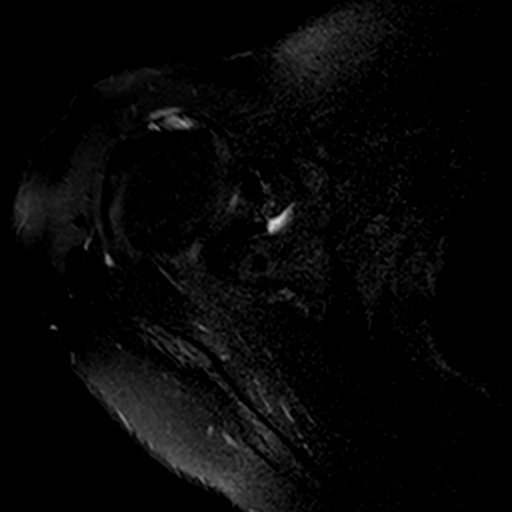

[Series 5: PD · sagittal · 3.0mm · 0.28mm/px · 7 of 20 slices shown]
[im 1/20]
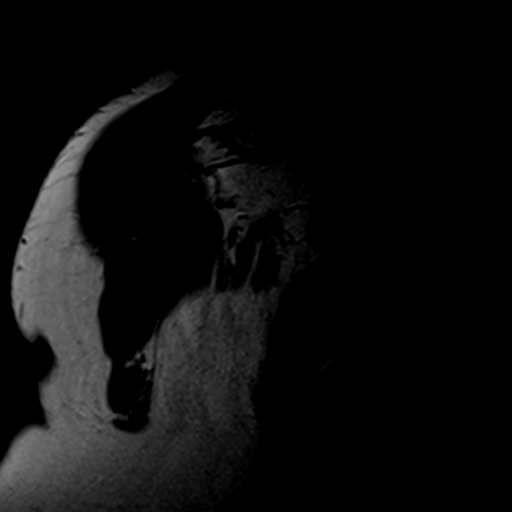
[im 4/20]
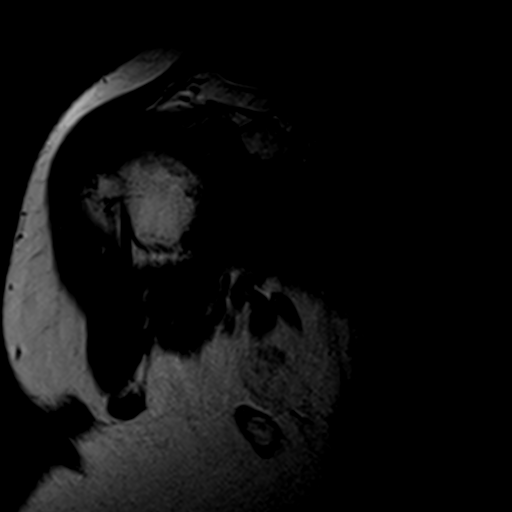
[im 7/20]
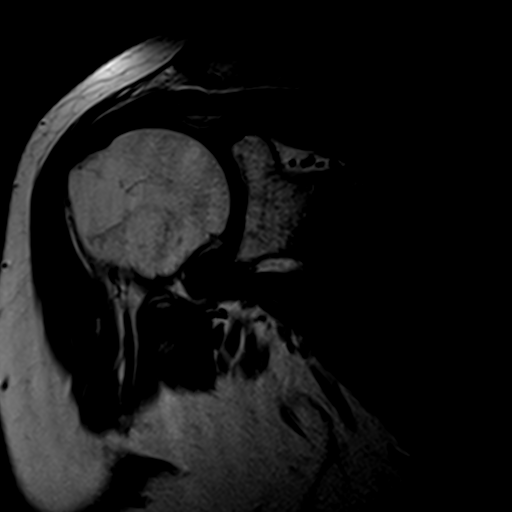
[im 10/20]
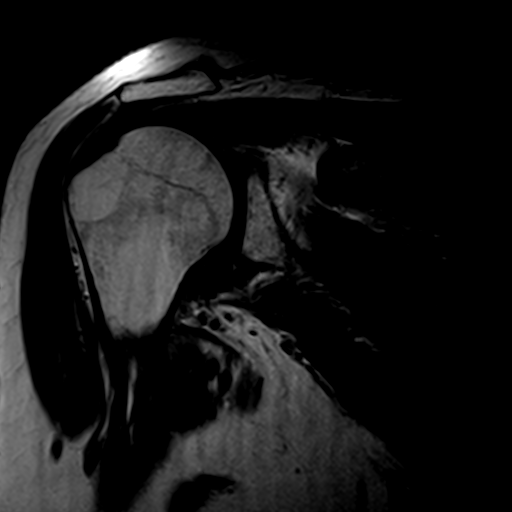
[im 13/20]
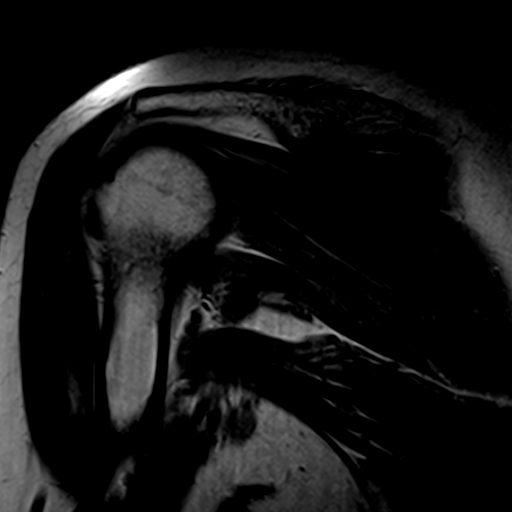
[im 16/20]
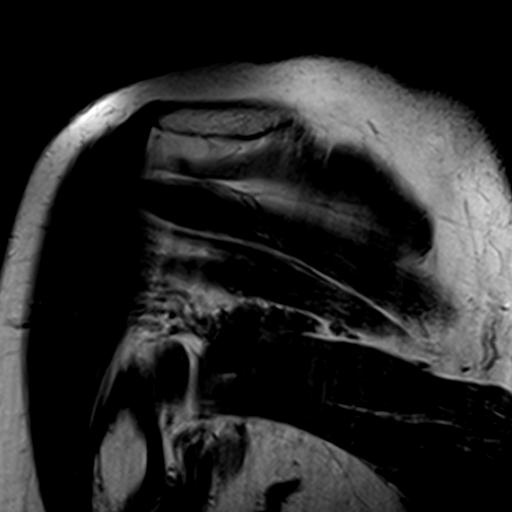
[im 20/20]
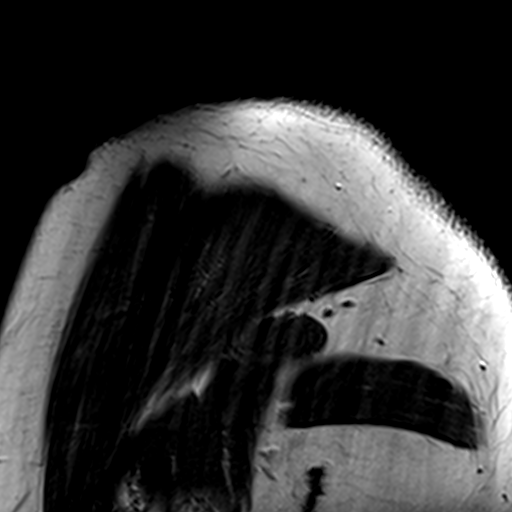

[Series 6: t2fs_blade_cor · sagittal · 3.0mm · 0.55mm/px · 3 of 22 slices shown]
[im 4/22]
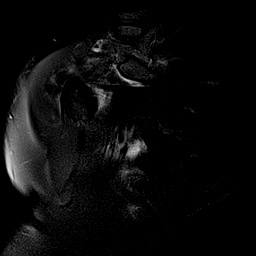
[im 13/22]
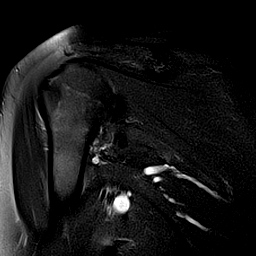
[im 19/22]
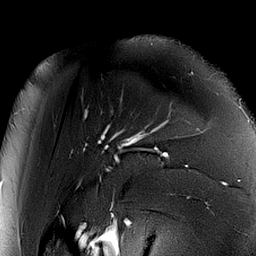

[Series 7: T1 · oblique · 3.0mm · 0.25mm/px · 8 of 24 slices shown]
[im 1/24]
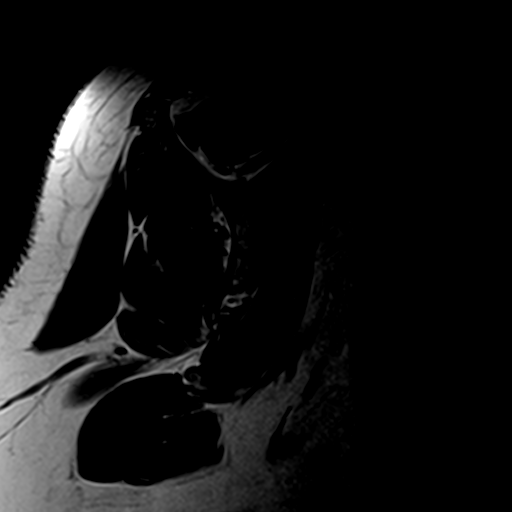
[im 3/24]
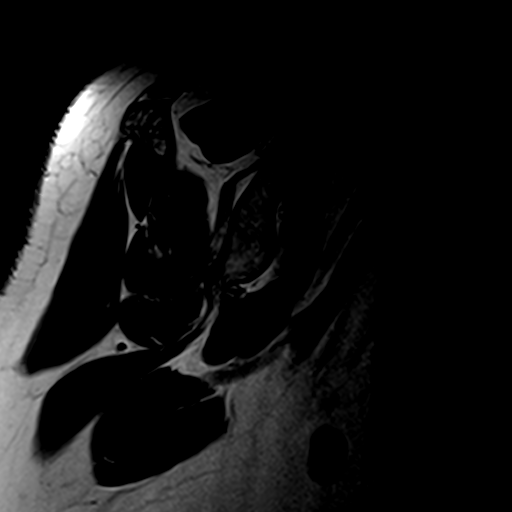
[im 6/24]
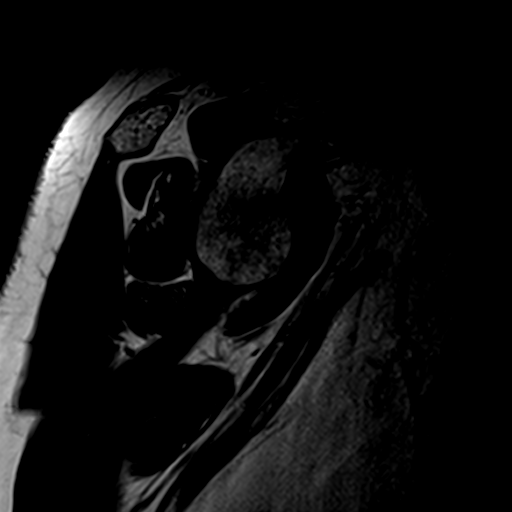
[im 9/24]
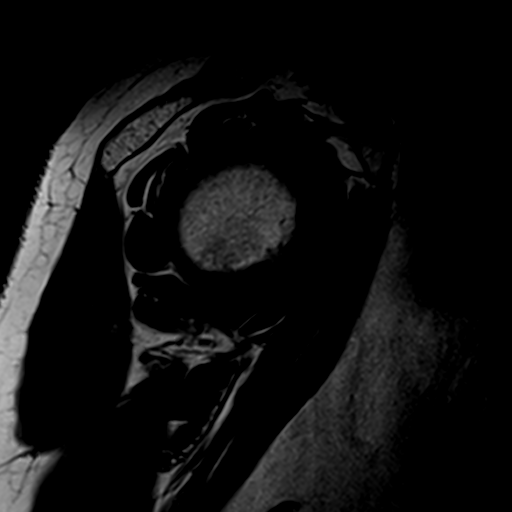
[im 12/24]
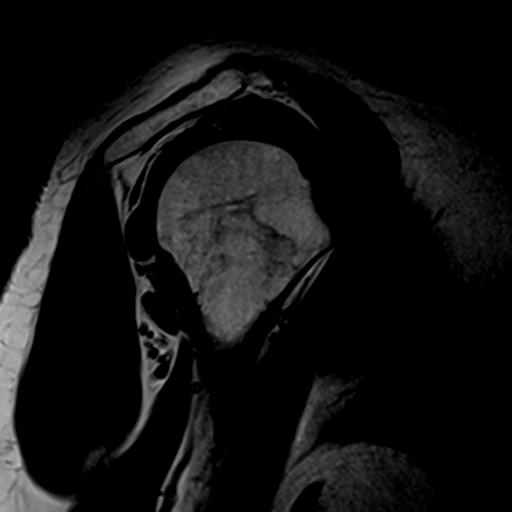
[im 15/24]
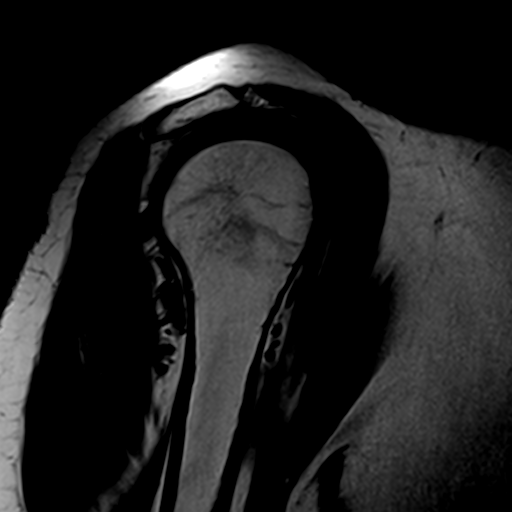
[im 18/24]
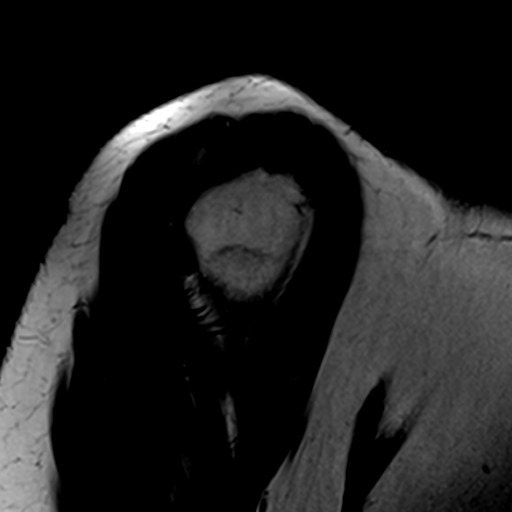
[im 21/24]
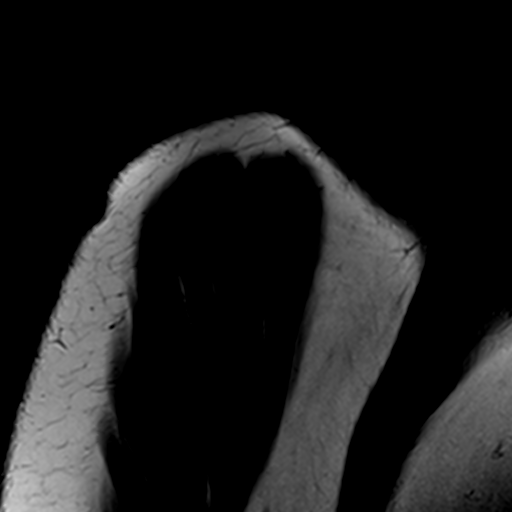

[21 of 40 positions shown; findings below may reference images not displayed]

FINDINGS: Rotator cuff: Rotator cuff tendinopathy is most notable in the
supraspinatus. 0.4 cm from front to back undersurface tear of the
leading edge of the supraspinatus is again seen without retraction.
The rotator cuff is otherwise intact.

Muscles:  Normal without atrophy or focal lesion.

Biceps long head:  Intact.

Acromioclavicular Joint: Normal. Type 1 acromion. Small amount of
fluid is seen in the subacromial/subdeltoid bursa, decreased since
the prior exam.

Glenohumeral Joint: Appears normal.

Labrum:  Intact.

Bones:  Normal marrow signal throughout.

Other: None.
IMPRESSION: No new abnormality since the prior MRI.

Rotator cuff tendinopathy most notable in the supraspinatus where
there is an approximately 0.4 cm from front to back undersurface
tear of the anterior and far lateral tendon. No retraction or
atrophy.

Small volume of subacromial/subdeltoid fluid compatible with
bursitis. Volume of fluid is has decreased since the prior exam.

## 2019-01-02 ENCOUNTER — Ambulatory Visit: Payer: 59 | Admitting: Allergy & Immunology

## 2019-02-18 ENCOUNTER — Other Ambulatory Visit: Payer: Self-pay

## 2019-02-18 DIAGNOSIS — Z20822 Contact with and (suspected) exposure to covid-19: Secondary | ICD-10-CM

## 2019-02-21 LAB — NOVEL CORONAVIRUS, NAA: SARS-CoV-2, NAA: NOT DETECTED

## 2019-03-06 ENCOUNTER — Emergency Department (HOSPITAL_COMMUNITY)
Admission: EM | Admit: 2019-03-06 | Discharge: 2019-03-06 | Disposition: A | Discharge status: Home / Self Care | Payer: Self-pay | Attending: Emergency Medicine | Admitting: Emergency Medicine

## 2019-03-06 ENCOUNTER — Other Ambulatory Visit: Payer: Self-pay

## 2019-03-06 ENCOUNTER — Emergency Department (HOSPITAL_COMMUNITY): Payer: Self-pay

## 2019-03-06 ENCOUNTER — Encounter (HOSPITAL_COMMUNITY): Payer: Self-pay | Admitting: Emergency Medicine

## 2019-03-06 DIAGNOSIS — J441 Chronic obstructive pulmonary disease with (acute) exacerbation: Secondary | ICD-10-CM | POA: Insufficient documentation

## 2019-03-06 DIAGNOSIS — E039 Hypothyroidism, unspecified: Secondary | ICD-10-CM | POA: Insufficient documentation

## 2019-03-06 DIAGNOSIS — Z79899 Other long term (current) drug therapy: Secondary | ICD-10-CM | POA: Insufficient documentation

## 2019-03-06 DIAGNOSIS — Z87891 Personal history of nicotine dependence: Secondary | ICD-10-CM | POA: Insufficient documentation

## 2019-03-06 LAB — BASIC METABOLIC PANEL
Anion gap: 12 (ref 5–15)
BUN: 13 mg/dL (ref 6–20)
CO2: 20 mmol/L — ABNORMAL LOW (ref 22–32)
Calcium: 9.3 mg/dL (ref 8.9–10.3)
Chloride: 107 mmol/L (ref 98–111)
Creatinine, Ser: 0.9 mg/dL (ref 0.44–1.00)
GFR calc Af Amer: >60 mL/min (ref >60)
GFR calc non Af Amer: >60 mL/min (ref >60)
Glucose, Bld: 108 mg/dL — ABNORMAL HIGH (ref 70–99)
Potassium: 3.3 mmol/L — ABNORMAL LOW (ref 3.5–5.1)
Sodium: 139 mmol/L (ref 135–145)

## 2019-03-06 LAB — CBC WITH DIFFERENTIAL/PLATELET
Abs Immature Granulocytes: 0.17 10*3/uL — ABNORMAL HIGH (ref 0.00–0.07)
Basophils Absolute: 0.1 10*3/uL (ref 0.0–0.1)
Basophils Relative: 1 %
Eosinophils Absolute: 0.8 10*3/uL — ABNORMAL HIGH (ref 0.0–0.5)
Eosinophils Relative: 6 %
HCT: 35.1 % — ABNORMAL LOW (ref 36.0–46.0)
Hemoglobin: 11 g/dL — ABNORMAL LOW (ref 12.0–15.0)
Immature Granulocytes: 1 %
Lymphocytes Relative: 24 %
Lymphs Abs: 3.1 10*3/uL (ref 0.7–4.0)
MCH: 28.8 pg (ref 26.0–34.0)
MCHC: 31.3 g/dL (ref 30.0–36.0)
MCV: 91.9 fL (ref 80.0–100.0)
Monocytes Absolute: 0.8 10*3/uL (ref 0.1–1.0)
Monocytes Relative: 6 %
Neutro Abs: 8.1 10*3/uL — ABNORMAL HIGH (ref 1.7–7.7)
Neutrophils Relative %: 62 %
Platelets: 312 10*3/uL (ref 150–400)
RBC: 3.82 MIL/uL — ABNORMAL LOW (ref 3.87–5.11)
RDW: 12.6 % (ref 11.5–15.5)
WBC: 13 10*3/uL — ABNORMAL HIGH (ref 4.0–10.5)
nRBC: 0 % (ref 0.0–0.2)

## 2019-03-06 MED ORDER — ALBUTEROL SULFATE HFA 108 (90 BASE) MCG/ACT IN AERS
8.0000 | INHALATION_SPRAY | Freq: Once | RESPIRATORY_TRACT | Status: AC
  Administered 2019-03-06: 8 via RESPIRATORY_TRACT
  Filled 2019-03-06: qty 6.7

## 2019-03-06 MED ORDER — MAGNESIUM SULFATE 2 GM/50ML IV SOLN
2.0000 g | Freq: Once | INTRAVENOUS | Status: AC
  Administered 2019-03-06: 2 g via INTRAVENOUS
  Filled 2019-03-06: qty 50

## 2019-03-06 MED ORDER — METHYLPREDNISOLONE SODIUM SUCC 125 MG IJ SOLR
125.0000 mg | Freq: Once | INTRAMUSCULAR | Status: AC
  Administered 2019-03-06: 125 mg via INTRAVENOUS
  Filled 2019-03-06: qty 2

## 2019-03-06 NOTE — ED Provider Notes (Signed)
[facility] EMERGENCY DEPARTMENT Provider Note   CSN: [id number] Arrival date & time: 03/06/19  4128     History   Chief Complaint Chief Complaint  Patient presents with  . Shortness of Breath    HPI [name] is a 53 y.o. female.     HPI   She presents for evaluation of persistent cough or shortness of breath despite being treated with antibiotic and prednisone medications.  Treated by her pulmonologist, 5 days ago.  She denies chest pain, nausea, vomiting, loss of taste or smell.  No known exposure to COVID-19 or other infectious processes.  No fever or chills.  No recent hospitalizations or COPD exacerbations.  She is on home oxygen.  She does not smoke.  She is on home oxygen.  There are no other known modifying factors.   Past Medical History:  Diagnosis Date  . Anxiety   . Asthma   . Cervical radiculopathy   . Cluster headaches   . COPD (chronic obstructive pulmonary disease) ([place])   . COPD (chronic obstructive pulmonary disease) with chronic bronchitis ([place])   . GERD (gastroesophageal reflux disease)   . Hyperlipemia   . Hypoglycemia   . Hypothyroidism   . Migraine   . Pneumothorax   . Rectal discharge 07/28/2010  . Shortness of breath   . Sleep apnea    cannot tolerate-not using CPAP  . Sluggishness 07/28/2010    Patient Active Problem List   Diagnosis Date Noted  . Spinal stenosis of cervical region 04/05/2017  . Partial tear of right subscapularis tendon   . Incisional hernia, without obstruction or gangrene   . Urinary frequency 03/21/2012  . Migraine equivalent syndrome 03/21/2012  . Insomnia 03/21/2012    Past Surgical History:  Procedure Laterality Date  . ABDOMINAL HYSTERECTOMY     with bladder suspension  . CERVICAL FUSION    . CHOLECYSTECTOMY    . COLONOSCOPY N/A 07/30/2013   Procedure: COLONOSCOPY;  Surgeon: Rogene Houston, MD;  Location: [facility];  Service: Endoscopy;  Laterality: N/A;  930  . EXAM UNDER ANESTHESIA  WITH MANIPULATION OF SHOULDER Right 10/11/2016   Procedure: EXAM UNDER ANESTHESIA WITH MANIPULATION OF SHOULDER;  Surgeon: Carole Civil, MD;  Location: [facility] ORS;  Service: Orthopedics;  Laterality: Right;  . INCISIONAL HERNIA REPAIR N/A 07/30/2016   Procedure: HERNIA REPAIR INCISIONAL WITH MESH;  Surgeon: Aviva Signs, MD;  Location: [facility] ORS;  Service: General;  Laterality: N/A;  . LOBECTOMY     right lung   . POSTERIOR CERVICAL LAMINECTOMY Right 04/05/2017   Procedure: Right C6-7 Posterior cervical laminectomy;  Surgeon: Kary Kos, MD;  Location: [place];  Service: Neurosurgery;  Laterality: Right;  Right C6-7 Posterior cervical laminectomy  . SHOULDER ARTHROSCOPY WITH ROTATOR CUFF REPAIR Right 10/11/2016   Procedure: SHOULDER ARTHROSCOPY;  Surgeon: Carole Civil, MD;  Location: [facility];  Service: Orthopedics;  Laterality: Right;  . TUBAL LIGATION       OB History   No obstetric history on file.      Home Medications    Prior to Admission medications   Medication Sig Start Date End Date Taking? Authorizing Provider  acetaminophen (TYLENOL) 500 MG tablet Take 1,000 mg by mouth every 6 (six) hours as needed for moderate pain.   Yes [provider]  albuterol (VENTOLIN HFA) 108 (90 BASE) MCG/ACT inhaler Inhale 2 puffs into the lungs every 4 (four) hours as needed for shortness of breath.    Yes [provider]  aspirin-acetaminophen-caffeine (EXCEDRIN MIGRAINE) [phone] MG tablet Take 2 tablets by mouth daily as needed for headache or migraine.    Yes [provider]  bismuth subsalicylate (PEPTO BISMOL) 262 MG chewable tablet Chew 524 mg by mouth as needed for indigestion.   Yes [provider]  cetirizine (ZYRTEC) 10 MG tablet Take 10 mg by mouth daily.   Yes [provider]  citalopram (CELEXA) 40 MG tablet Take 40 mg by mouth daily.  05/06/18  Yes [provider]  docusate sodium (COLACE) 100 MG capsule Take 100 mg by mouth  daily as needed for mild constipation.    Yes [provider]  doxycycline (VIBRAMYCIN) 100 MG capsule Take 100 mg by mouth 2 (two) times daily. 03/03/19  Yes [provider]  ipratropium-albuterol (DUONEB) 0.5-2.5 (3) MG/3ML SOLN Inhale 3 mLs into the lungs 4 (four) times daily.  01/01/19  Yes [provider]  levothyroxine (SYNTHROID, LEVOTHROID) 75 MCG tablet Take 75 mcg by mouth daily before breakfast.   Yes [provider]  LORazepam (ATIVAN) 1 MG tablet Take 1 mg by mouth 2 (two) times daily as needed. 03/01/19  Yes [provider]  montelukast (SINGULAIR) 10 MG tablet Take 1 tablet (10 mg total) by mouth at bedtime. 06/06/18  Yes Shane Crutch, MD  pantoprazole (PROTONIX) 40 MG tablet Take 1 tablet (40 mg total) by mouth daily. 04/24/18 04/24/19 Yes Shane Crutch, MD  pravastatin (PRAVACHOL) 20 MG tablet Take 20 mg by mouth daily.  06/03/18  Yes [provider]  predniSONE (DELTASONE) 10 MG tablet Take 10-40 mg by mouth as directed. Take 4 tablets by mouth once daily for 3 days, then take 3 tablets by mouth once daily for 3 days, then take 2 tablets by mouth once daily, then take 1 tablet by mouth once daily for 3 days, then stop 03/03/19  Yes [provider]  SUMAtriptan 6 MG/0.5ML SOAJ Inject 6 mg as directed daily as needed (migraine).  12/01/14  Yes [provider]  traMADol (ULTRAM) 50 MG tablet Take 1 tablet (50 mg total) by mouth every 6 (six) hours as needed. Patient taking differently: Take 50 mg by mouth every 6 (six) hours as needed for moderate pain.  10/12/16  Yes Vickki Hearing, MD  valACYclovir (VALTREX) 500 MG tablet Take 2,000 mg by mouth See admin instructions. Take s at first sign of fever blister outbreak then take s daily until gone 04/22/16  Yes [provider]    Family History Family History  Problem Relation Age of Onset  . COPD Mother   . Diabetes Mother   .  Hyperlipidemia Mother   . Hypertension Mother   . Bipolar disorder Son   . Heart failure Maternal Grandmother   . Hypertension Maternal Grandmother   . Thyroid disease Maternal Grandmother   . Diabetes Paternal Grandmother   . Alzheimer's disease Paternal Grandmother   . Heart failure Paternal Grandfather   . Hypertension Paternal Grandfather   . Hypertension Father   . Colon cancer Neg Hx     Social History Social History   Tobacco Use  . Smoking status: Former Smoker    Packs/day: 1.00    Years: 30.00    Pack years: 30.00    Types: Cigarettes    Quit date: 02/21/2008    Years since quitting: 11.0  . Smokeless tobacco: Never Used  Substance Use Topics  . Alcohol use: No    Alcohol/week: 0.0 standard drinks  Comment: no  . Drug use: No     Allergies   Aspirin, Penicillins, Eggs or egg-derived products, and Vicodin [hydrocodone-acetaminophen]   Review of Systems Review of Systems  All other systems reviewed and are negative.    Physical Exam Updated Vital Signs BP (!) 122/57   Pulse 93   Temp 99 F (37.2 C) (Oral)   Resp 16   Ht  (1.626 m)   Wt 68.5 kg   SpO2 99%   BMI 25.92 kg/m   Physical Exam Vitals signs and nursing note reviewed.  Constitutional:      Appearance: She is well-developed.  HENT:     Head: Normocephalic and atraumatic.     Right Ear: External ear normal.     Left Ear: External ear normal.  Eyes:     Conjunctiva/sclera: Conjunctivae normal.     Pupils: Pupils are equal, round, and reactive to light.  Neck:     Musculoskeletal: Normal range of motion and neck supple.     Trachea: Phonation normal.  Cardiovascular:     Rate and Rhythm: Normal rate and regular rhythm.  Pulmonary:     Effort: Pulmonary effort is normal. No respiratory distress.     Breath sounds: No stridor.     Comments: No increased work of breathing.  Tachypnea is present. Abdominal:     Palpations: Abdomen is soft.     Tenderness: There is no  abdominal tenderness.  Musculoskeletal: Normal range of motion.        General: No swelling or tenderness.     Right lower leg: No edema.     Left lower leg: No edema.  Skin:    General: Skin is warm and dry.  Neurological:     Mental Status: She is alert and oriented to person, place, and time.     Cranial Nerves: No cranial nerve deficit.     Sensory: No sensory deficit.     Motor: No abnormal muscle tone.     Coordination: Coordination normal.  Psychiatric:        Mood and Affect: Mood normal.        Behavior: Behavior normal.        Thought Content: Thought content normal.        Judgment: Judgment normal.      ED Treatments / Results  Labs (all labs ordered are listed, but only abnormal results are displayed) Labs Reviewed  CBC WITH DIFFERENTIAL/PLATELET - Abnormal; Notable for the following components:      Result Value   WBC 13.0 (*)    RBC 3.82 (*)    Hemoglobin 11.0 (*)    HCT 35.1 (*)    Neutro Abs 8.1 (*)    Eosinophils Absolute 0.8 (*)    Abs Immature Granulocytes 0.17 (*)    All other components within normal limits  BASIC METABOLIC PANEL - Abnormal; Notable for the following components:   Potassium 3.3 (*)    CO2 20 (*)    Glucose, Bld 108 (*)    All other components within normal limits    EKG None  Radiology Dg Chest Port 1 View  Result Date: 03/06/2019 CLINICAL DATA:  Shortness of breath, cough EXAM: PORTABLE CHEST 1 VIEW COMPARISON:  08/28/2018 FINDINGS: The heart size and mediastinal contours are within normal limits. Suture material overlies the right lung apex. Both lungs are clear. Cervical fixation hardware within the lower C-spine. IMPRESSION: No acute cardiopulmonary findings. Electronically Signed   By: Janyth Pupa  Plundo M.D.   On: 03/06/2019 10:25    Procedures .Critical Care Performed by: Mancel BaleWentz, Bell Cai, MD Authorized by: Mancel BaleWentz, Kodi Steil, MD   Critical care provider statement:    Critical care time (minutes):  40   Critical care  start time:  03/06/2019 9:00 AM   Critical care end time:  03/06/2019 2:23 PM   Critical care time was exclusive of:  Separately billable procedures and treating other patients   Critical care was necessary to treat or prevent imminent or life-threatening deterioration of the following conditions:  Respiratory failure   Critical care was time spent personally by me on the following activities:  Blood draw for specimens, development of treatment plan with patient or surrogate, discussions with consultants, evaluation of patient's response to treatment, examination of patient, obtaining history from patient or surrogate, ordering and performing treatments and interventions, ordering and review of laboratory studies, pulse oximetry, re-evaluation of patient's condition, review of old charts and ordering and review of radiographic studies   (including critical care time)  Medications Ordered in ED Medications  methylPREDNISolone sodium succinate (SOLU-MEDROL) 125 mg/2 mL injection 125 mg (125 mg Intravenous Given 03/06/19 1000)  magnesium sulfate IVPB 2 g 50 mL (0 g Intravenous Stopped 03/06/19 1102)  albuterol (VENTOLIN HFA) 108 (90 Base) MCG/ACT inhaler 8 puff (8 puffs Inhalation Given 03/06/19 0943)     Initial Impression / Assessment and Plan / ED Course  I have reviewed the triage vital signs and the nursing notes.  Pertinent labs & imaging results that were available during my care of the patient were reviewed by me and considered in my medical decision making (see chart for details).  Clinical Course as of Mar 05 1417  Fri Mar 06, 2019  1100 Normal except potassium low, CO2 low, glucose high  Basic metabolic panel(!) [EW]  1100 Normal except white count high, hemoglobin low  CBC with Differential(!) [EW]  1100 No infiltrate or CHF, image interpreted by me  DG Chest Port 1 View [EW]  [id number] Findings were discussed with her pulmonologist, Dr. Juanetta GoslingHawkins.  He states that patient can be  discharged on her current treatment including antibiotic and prednisone medications.   [EW]    Clinical Course User Index [EW] Mancel BaleWentz, Danea Manter, MD        Patient Vitals for the past 24 hrs:  BP Temp Temp src Pulse Resp SpO2 Height Weight  03/06/19 1400 - - - 93 16 99 % - -  03/06/19 1330 - - - 84 20 98 % - -  03/06/19 1300 - - - 90 (!) 26 99 % - -  03/06/19 1230 - - - 85 (!) 29 99 % - -  03/06/19 1202 (!) 122/57 - - (!) 101 (!) 22 98 % - -  03/06/19 1200 - - - 88 20 98 % - -  03/06/19 1130 - - - (!) 126 14 94 % - -  03/06/19 1100 - - - 92 (!) 26 97 % - -  03/06/19 1030 - - - (!) 104 (!) 21 99 % - -  03/06/19 1000 - - - (!) 104 (!) 22 98 % - -  03/06/19 0857 118/61 99 F (37.2 C) Oral 81 (!) 32 100 % - -  03/06/19 0855 - - - - - - 5\' 4"  (1.626 m) 68.5 kg    2:18 PM Reevaluation with update and discussion. After initial assessment and treatment, an updated evaluation reveals vital signs are steadily improved.  No change in  clinical status.  She does not have increased work of breathing at this time.  Findings discussed with the patient and all questions were answered. Mancel Bale   Medical Decision Making: Shortness of breath with COPD, currently being treated for COPD exacerbation with antibiotic and prednisone.  Screening evaluation is reassuring.  Mild elevation of white count, with normal vital signs.  Oxygenation is normal with supplementation.  Chest x-ray does not indicate pneumonia.  Doubt metabolic instability or impending vascular collapse.   Jalani Cullifer Talford was evaluated in Emergency Department on 03/06/2019 for the symptoms described in the history of present illness. She was evaluated in the context of the global COVID-19 pandemic, which necessitated consideration that the patient might be at risk for infection with the SARS-CoV-2 virus that causes COVID-19. Institutional protocols and algorithms that pertain to the evaluation of patients at risk for COVID-19 are in a  state of rapid change based on information released by regulatory bodies including the CDC and federal and state organizations. These policies and algorithms were followed during the patient's care in the ED.  CRITICAL CARE-yes Performed by: Mancel Bale  Nursing Notes Reviewed/ Care Coordinated Applicable Imaging Reviewed Interpretation of Laboratory Data incorporated into ED treatment  The patient appears reasonably screened and/or stabilized for discharge and I doubt any other medical condition or other [facility] requiring further screening, evaluation, or treatment in the ED at this time prior to discharge.  Plan: Home Medications-continue usual including prednisone and oral antibiotic; Home Treatments-rest, fluids; return here if the recommended treatment, does not improve the symptoms; Recommended follow up-PCP/pulmonary, as needed.   Final Clinical Impressions(s) / ED Diagnoses   Final diagnoses:  None    ED Discharge Orders    None       Mancel Bale, MD 03/06/19 1425

## 2019-03-06 NOTE — ED Triage Notes (Signed)
Pt states she has been having progressive sob and cough for a week.

## 2019-03-06 NOTE — Discharge Instructions (Signed)
Continue taking your current medications, including the antibiotic and prednisone.  Make sure you are drinking plenty of fluids.  Return here, if needed, for problems.

## 2019-04-30 ENCOUNTER — Ambulatory Visit (INDEPENDENT_AMBULATORY_CARE_PROVIDER_SITE_OTHER): Payer: Self-pay | Admitting: Emergency Medicine

## 2019-04-30 ENCOUNTER — Other Ambulatory Visit: Payer: Self-pay

## 2019-04-30 ENCOUNTER — Encounter: Payer: Self-pay | Admitting: Emergency Medicine

## 2019-04-30 DIAGNOSIS — J4489 Other specified chronic obstructive pulmonary disease: Secondary | ICD-10-CM | POA: Insufficient documentation

## 2019-04-30 DIAGNOSIS — J9611 Chronic respiratory failure with hypoxia: Secondary | ICD-10-CM | POA: Insufficient documentation

## 2019-04-30 DIAGNOSIS — J449 Chronic obstructive pulmonary disease, unspecified: Secondary | ICD-10-CM | POA: Insufficient documentation

## 2019-04-30 MED ORDER — BUDESONIDE 0.5 MG/2ML IN SUSP: 0.5000 mg | Freq: Two times a day (BID) | RESPIRATORY_TRACT | 12 refills | Status: DC

## 2019-04-30 NOTE — Patient Instructions (Signed)
Please continue DuoNeb 4 times a day on a schedule. We will add budesonide (Pulmicort) nebulizer treatments twice a day. Keep your albuterol available to use either 2 puffs or 1 nebulizer treatment up to every 4 hours if needed for shortness of breath, chest tightness, wheezing. We may decide to adjust your nebulizer treatments to alternatives going forward depending on your insurance coverage. We may decide to start low-dose scheduled prednisone depending on how well your symptoms are controlled.  We can discuss this next time. Continue your oxygen at 4 L/min at all times. Follow with Dr. Delton Coombes in 1 month to assess your status on the new inhaler regimen.

## 2019-04-30 NOTE — Assessment & Plan Note (Signed)
Continue 4L/min at all times. Consider repeat ambulatory oximetry next time.

## 2019-04-30 NOTE — Assessment & Plan Note (Addendum)
Gold D COPD based on her clinical findings, exertional limitation, frequent exacerbations.  She is on a suboptimal regimen, no long-acting meds, mainly due to financial issues and lack of insurance.  She has had frequent runs of prednisone and may ultimately require low-dose maintenance steroids.  For now I will add budesonide, see if she gets benefit from.  Ultimately she will need repeat pulmonary function testing.  I would like to try to get her on long-acting nebulized therapy but cost will be prohibitive at this time.  She is working on getting disability and thereby Medicare.  She will want to transition to our [facility] office when it is up and running.  Please continue DuoNeb 4 times a day on a schedule. We will add budesonide (Pulmicort) nebulizer treatments twice a day. Keep your albuterol available to use either 2 puffs or 1 nebulizer treatment up to every 4 hours if needed for shortness of breath, chest tightness, wheezing. We may decide to adjust your nebulizer treatments to alternatives going forward depending on your insurance coverage. We may decide to start low-dose scheduled prednisone depending on how well your symptoms are controlled.  We can discuss this next time. Follow with Dr. Delton Coombes in 1 month to assess your status on the new inhaler regimen.

## 2019-04-30 NOTE — Progress Notes (Signed)
Subjective:    Patient ID: [name], female    DOB: 05-09-1965, 54 y.o.   MRN: [id number]  HPI 54 year old former smoker (60 pack years) with COPD/asthma previously followed by Dr. Juanetta Gosling in [facility], then Dr. Nicholos Johns at [facility].  She also has GERD on pantoprazole, hypothyroidism, chronic rhinitis on Zyrtec, migraines, hyperlipidemia, obstructive sleep apnea not on CPAP.  She has chronic hypoxemic respiratory failure and is on oxygen at 4 liters per minute.  Currently managed on Singulair, scheduled DuoNeb.  She has been on Trelegy and Dulera in the past. She uses albuterol 0-3x a day. Her activity is somewhat limited. She sometimes needs to stop to rest. She cannot shop for groceries. She has on/off cough, clears rare mucous, usually dry. She has had 5 AE in the last year, treated with pred and abx each time and has talked to Dr Juanetta Gosling before about long range pred. She just finished a course Monday.   Pulmonary function testing done 03/26/2018 reviewed by me shows moderate obstruction with out a significant bronchodilator response, borderline hyperinflation with a decreased diffusion capacity that does not correct when adjusted for alveolar volume.  IgE 05/27/2018 >> 3590 Eosinophil count >> 800/mcL  Chest x-ray 03/06/2019 reviewed by me shows no cardiopulmonary abnormality   Review of Systems  Constitutional: Negative for activity change, appetite change, chills, diaphoresis, fatigue, fever and unexpected weight change.  HENT: Positive for congestion, postnasal drip and rhinorrhea. Negative for dental problem, nosebleeds, sinus pressure, sneezing, trouble swallowing and voice change.   Eyes: Negative for itching and visual disturbance.  Respiratory: Positive for cough and shortness of breath. Negative for choking, chest tightness, wheezing and stridor.   Cardiovascular: Positive for chest pain. Negative for palpitations and leg swelling.  Gastrointestinal:  Positive for abdominal pain.       Reflux  Musculoskeletal: Negative for joint swelling and myalgias.  Skin: Negative for rash.  Neurological: Positive for headaches. Negative for syncope and light-headedness.  Psychiatric/Behavioral: Negative for sleep disturbance. The patient is nervous/anxious.     Past Medical History:  Diagnosis Date  . Anxiety   . Asthma   . Cervical radiculopathy   . Cluster headaches   . COPD (chronic obstructive pulmonary disease) (HCC)   . COPD (chronic obstructive pulmonary disease) with chronic bronchitis (HCC)   . GERD (gastroesophageal reflux disease)   . Hyperlipemia   . Hypoglycemia   . Hypothyroidism   . Migraine   . Pneumothorax   . Rectal discharge 07/28/2010  . Shortness of breath   . Sleep apnea    cannot tolerate-not using CPAP  . Sluggishness 07/28/2010     Family History  Problem Relation Age of Onset  . COPD Mother   . Diabetes Mother   . Hyperlipidemia Mother   . Hypertension Mother   . Bipolar disorder Son   . Heart failure Maternal Grandmother   . Hypertension Maternal Grandmother   . Thyroid disease Maternal Grandmother   . Diabetes Paternal Grandmother   . Alzheimer's disease Paternal Grandmother   . Heart failure Paternal Grandfather   . Hypertension Paternal Grandfather   . Hypertension Father   . Colon cancer Neg Hx      Social History   Socioeconomic History  . Marital status: Married    Spouse name: Not on file  . Number of children: Not on file  . Years of education: Not on file  . Highest education level: Not on file  Occupational History  . Not on  file  Tobacco Use  . Smoking status: Former Smoker    Packs/day: 1.00    Years: 30.00    Pack years: 30.00    Types: Cigarettes    Quit date: 02/21/2008    Years since quitting: 11.1  . Smokeless tobacco: Never Used  Substance and Sexual Activity  . Alcohol use: No    Alcohol/week: 0.0 standard drinks    Comment: no  . Drug use: No  . Sexual  activity: Not Currently    Partners: Male    Birth control/protection: Surgical  Other Topics Concern  . Not on file  Social History Narrative  . Not on file   Social Determinants of Health   Financial Resource Strain:   . Difficulty of Paying Living Expenses: Not on file  Food Insecurity:   . Worried About Programme researcher, broadcasting/film/video in the Last Year: Not on file  . Ran Out of Food in the Last Year: Not on file  Transportation Needs:   . Lack of Transportation (Medical): Not on file  . Lack of Transportation (Non-Medical): Not on file  Physical Activity:   . Days of Exercise per Week: Not on file  . Minutes of Exercise per Session: Not on file  Stress:   . Feeling of Stress : Not on file  Social Connections:   . Frequency of Communication with Friends and Family: Not on file  . Frequency of Social Gatherings with Friends and Family: Not on file  . Attends Religious Services: Not on file  . Active Member of Clubs or Organizations: Not on file  . Attends Banker Meetings: Not on file  . Marital Status: Not on file  Intimate Partner Violence:   . Fear of Current or Ex-Partner: Not on file  . Emotionally Abused: Not on file  . Physically Abused: Not on file  . Sexually Abused: Not on file     Allergies  Allergen Reactions  . Aspirin Shortness Of Breath    Causes asthma flares   . Penicillins Other (See Comments)    UNSPECIFIED REACTION OF CHILDHOOD  Has patient had a PCN reaction causing immediate rash, facial/tongue/throat swelling, SOB or lightheadedness with hypotension: NO Has patient had a PCN reaction causing severe rash involving mucus membranes or skin necrosis: NO Has patient had a PCN reaction that required hospitalization: no Has patient had a PCN reaction occurring within the last 10 years: NO If all of the above answers are "NO", then may proceed with Cephalosporin use.   . Eggs Or Egg-Derived Products Diarrhea and Other (See Comments)    BLOATING >  GAS  . Vicodin [Hydrocodone-Acetaminophen] Other (See Comments)    Headaches      Outpatient Medications Prior to Visit  Medication Sig Dispense Refill  . acetaminophen (TYLENOL) 500 MG tablet Take 1,000 mg by mouth every 6 (six) hours as needed for moderate pain.    Marland Kitchen albuterol (VENTOLIN HFA) 108 (90 BASE) MCG/ACT inhaler Inhale 2 puffs into the lungs every 4 (four) hours as needed for shortness of breath.     Marland Kitchen aspirin-acetaminophen-caffeine (EXCEDRIN MIGRAINE) 250-250-65 MG tablet Take 2 tablets by mouth daily as needed for headache or migraine.     . bismuth subsalicylate (PEPTO BISMOL) 262 MG chewable tablet Chew 524 mg by mouth as needed for indigestion.    . cetirizine (ZYRTEC) 10 MG tablet Take 10 mg by mouth daily.    . citalopram (CELEXA) 40 MG tablet Take 40 mg by mouth daily.     Marland Kitchen  docusate sodium (COLACE) 100 MG capsule Take 100 mg by mouth daily as needed for mild constipation.     Marland Kitchen ipratropium-albuterol (DUONEB) 0.5-2.5 (3) MG/3ML SOLN Inhale 3 mLs into the lungs 4 (four) times daily.     Marland Kitchen levothyroxine (SYNTHROID, LEVOTHROID) 75 MCG tablet Take 75 mcg by mouth daily before breakfast.    . LORazepam (ATIVAN) 1 MG tablet Take 1 mg by mouth 2 (two) times daily as needed.    . montelukast (SINGULAIR) 10 MG tablet Take 1 tablet (10 mg total) by mouth at bedtime. 30 tablet 3  . pravastatin (PRAVACHOL) 20 MG tablet Take 20 mg by mouth daily.     . SUMAtriptan 6 MG/0.5ML SOAJ Inject 6 mg as directed daily as needed (migraine).     . traMADol (ULTRAM) 50 MG tablet Take 1 tablet (50 mg total) by mouth every 6 (six) hours as needed. (Patient taking differently: Take 50 mg by mouth every 6 (six) hours as needed for moderate pain. ) 60 tablet 5  . valACYclovir (VALTREX) 500 MG tablet Take 2,000 mg by mouth See admin instructions. Take 2000mg s at first sign of fever blister outbreak then take 500mg s daily until gone    . pantoprazole (PROTONIX) 40 MG tablet Take 1 tablet (40 mg total) by  mouth daily. 90 tablet 1  . doxycycline (VIBRAMYCIN) 100 MG capsule Take 100 mg by mouth 2 (two) times daily.    . predniSONE (DELTASONE) 10 MG tablet Take 10-40 mg by mouth as directed. Take 4 tablets by mouth once daily for 3 days, then take 3 tablets by mouth once daily for 3 days, then take 2 tablets by mouth once daily, then take 1 tablet by mouth once daily for 3 days, then stop     No facility-administered medications prior to visit.        Objective:   Physical Exam Vitals:   04/30/19 1359  BP: 110/72  Pulse: 85  SpO2: 100%  Weight: 147 lb (66.7 kg)  Height: 5' 4.5" (1.638 m)   Gen: Pleasant, chronically ill, in no distress,  normal affect  ENT: No lesions,  mouth clear,  oropharynx clear, no postnasal drip  Neck: No JVD, no stridor  Lungs: No use of accessory muscles, very distant, no wheeze  Cardiovascular: RRR, heart sounds normal, no murmur or gallops, no peripheral edema  Musculoskeletal: No deformities, no cyanosis or clubbing  Neuro: alert, awake, non focal  Skin: Warm, no lesions or rash      Assessment & Plan:  COPD (chronic obstructive pulmonary disease) (HCC) Gold D COPD based on her clinical findings, exertional limitation, frequent exacerbations.  She is on a suboptimal regimen, no long-acting meds, mainly due to financial issues and lack of insurance.  She has had frequent runs of prednisone and may ultimately require low-dose maintenance steroids.  For now I will add budesonide, see if she gets benefit from.  Ultimately she will need repeat pulmonary function testing.  I would like to try to get her on long-acting nebulized therapy but cost will be prohibitive at this time.  She is working on getting disability and thereby Medicare.  She will want to transition to our [facility] office when it is up and running.  Please continue DuoNeb 4 times a day on a schedule. We will add budesonide (Pulmicort) nebulizer treatments twice a day. Keep your albuterol  available to use either 2 puffs or 1 nebulizer treatment up to every 4 hours if needed for shortness  of breath, chest tightness, wheezing. We may decide to adjust your nebulizer treatments to alternatives going forward depending on your insurance coverage. We may decide to start low-dose scheduled prednisone depending on how well your symptoms are controlled.  We can discuss this next time. Follow with Dr. Lamonte Sakai in 1 month to assess your status on the new inhaler regimen.    Chronic respiratory failure with hypoxia (HCC) Continue 4L/min at all times. Consider repeat ambulatory oximetry next time.     Baltazar Apo, MD, PhD 04/30/2019, 3:06 PM [facility] [phone] or if no answer [phone]

## 2019-05-05 ENCOUNTER — Telehealth: Payer: Self-pay | Admitting: Emergency Medicine

## 2019-05-05 NOTE — Telephone Encounter (Signed)
RB can the patient wear an oxygen mask with 4L of oxygen? Please advise.

## 2019-05-06 NOTE — Telephone Encounter (Signed)
If she is able to obtain a simple mask from her DME then I am OK with her using either the mask or [place] as long as she meets her saturation goals, SpO2 > 88%

## 2019-05-06 NOTE — Telephone Encounter (Signed)
Spoke with pt. She is aware of RB's response. Nothing further was needed. 

## 2019-05-21 ENCOUNTER — Encounter: Payer: Self-pay | Admitting: Emergency Medicine

## 2019-05-21 ENCOUNTER — Other Ambulatory Visit: Payer: Self-pay

## 2019-05-21 ENCOUNTER — Ambulatory Visit (INDEPENDENT_AMBULATORY_CARE_PROVIDER_SITE_OTHER): Payer: Self-pay | Admitting: Emergency Medicine

## 2019-05-21 VITALS — BP 108/64 | HR 85 | Ht 65.0 in | Wt 149.0 lb

## 2019-05-21 DIAGNOSIS — J309 Allergic rhinitis, unspecified: Secondary | ICD-10-CM | POA: Insufficient documentation

## 2019-05-21 DIAGNOSIS — J301 Allergic rhinitis due to pollen: Secondary | ICD-10-CM

## 2019-05-21 DIAGNOSIS — J449 Chronic obstructive pulmonary disease, unspecified: Secondary | ICD-10-CM

## 2019-05-21 MED ORDER — PREDNISONE 10 MG PO TABS: ORAL_TABLET | ORAL | 0 refills | Status: DC

## 2019-05-21 NOTE — Assessment & Plan Note (Signed)
Continue Zyrtec and Singulair 

## 2019-05-21 NOTE — Progress Notes (Signed)
Subjective:    Patient ID: [name], female    DOB: [birth year], 54 y.o.   MRN: [id number]  HPI 54 year old former smoker (60 pack years) with COPD/asthma previously followed by Dr. Juanetta Gosling in [facility], then Dr. Nicholos Johns at [facility].  She also has GERD on pantoprazole, hypothyroidism, chronic rhinitis on Zyrtec, migraines, hyperlipidemia, obstructive sleep apnea not on CPAP.  She has chronic hypoxemic respiratory failure and is on oxygen at 4 liters per minute.  Currently managed on Singulair, scheduled DuoNeb.  She has been on Trelegy and Dulera in the past. She uses albuterol 0-3x a day. Her activity is somewhat limited. She sometimes needs to stop to rest. She cannot shop for groceries. She has on/off cough, clears rare mucous, usually dry. She has had 5 AE in the last year, treated with pred and abx each time and has talked to Dr Juanetta Gosling before about long range pred. She just finished a course Monday.   Pulmonary function testing done 03/26/2018 reviewed by me shows moderate obstruction with out a significant bronchodilator response, borderline hyperinflation with a decreased diffusion capacity that does not correct when adjusted for alveolar volume.  IgE 05/27/2018 >> 3590 Eosinophil count >> 800/mcL  Chest x-ray 03/06/2019 reviewed by me shows no cardiopulmonary abnormality  ROV 05/21/2019 --54 year old woman with COPD and associated hypoxemic respiratory failure.  She also has OSA not on CPAP.  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  Currently managed on DuoNeb.  Added Pulmicort last time.  Today she reports that her breathing has been worsening since she last came of prednisone. She is coughing, producing thick white intermittently. On zyrtec, singulair.     Review of Systems  Constitutional: Negative for activity change, appetite change, chills, diaphoresis, fatigue, fever and unexpected weight change.  HENT: Positive for  congestion, postnasal drip and rhinorrhea. Negative for dental problem, nosebleeds, sinus pressure, sneezing, trouble swallowing and voice change.   Eyes: Negative for itching and visual disturbance.  Respiratory: Positive for cough and shortness of breath. Negative for choking, chest tightness, wheezing and stridor.   Cardiovascular: Positive for chest pain. Negative for palpitations and leg swelling.  Gastrointestinal: Positive for abdominal pain.       Reflux  Musculoskeletal: Negative for joint swelling and myalgias.  Skin: Negative for rash.  Neurological: Positive for headaches. Negative for syncope and light-headedness.  Psychiatric/Behavioral: Negative for sleep disturbance. The patient is nervous/anxious.     Past Medical History:  Diagnosis Date  . Anxiety   . Asthma   . Cervical radiculopathy   . Cluster headaches   . COPD (chronic obstructive pulmonary disease) (HCC)   . COPD (chronic obstructive pulmonary disease) with chronic bronchitis (HCC)   . GERD (gastroesophageal reflux disease)   . Hyperlipemia   . Hypoglycemia   . Hypothyroidism   . Migraine   . Pneumothorax   . Rectal discharge 07/28/2010  . Shortness of breath   . Sleep apnea    cannot tolerate-not using CPAP  . Sluggishness 07/28/2010     Family History  Problem Relation Age of Onset  . COPD Mother   . Diabetes Mother   . Hyperlipidemia Mother   . Hypertension Mother   . Bipolar disorder Son   . Heart failure Maternal Grandmother   . Hypertension Maternal Grandmother   . Thyroid disease Maternal Grandmother   . Diabetes Paternal Grandmother   . Alzheimer's disease Paternal Grandmother   . Heart failure Paternal Grandfather   .  Hypertension Paternal Grandfather   . Hypertension Father   . Colon cancer Neg Hx      Social History   Socioeconomic History  . Marital status: Married    Spouse name: Not on file  . Number of children: Not on file  . Years of education: Not on file  . Highest  education level: Not on file  Occupational History  . Not on file  Tobacco Use  . Smoking status: Former Smoker    Packs/day: 1.00    Years: 30.00    Pack years: 30.00    Types: Cigarettes    Quit date: 02/21/2008    Years since quitting: 11.2  . Smokeless tobacco: Never Used  Substance and Sexual Activity  . Alcohol use: No    Alcohol/week: 0.0 standard drinks    Comment: no  . Drug use: No  . Sexual activity: Not Currently    Partners: Male    Birth control/protection: Surgical  Other Topics Concern  . Not on file  Social History Narrative  . Not on file   Social Determinants of Health   Financial Resource Strain:   . Difficulty of Paying Living Expenses: Not on file  Food Insecurity:   . Worried About Programme researcher, broadcasting/film/video in the Last Year: Not on file  . Ran Out of Food in the Last Year: Not on file  Transportation Needs:   . Lack of Transportation (Medical): Not on file  . Lack of Transportation (Non-Medical): Not on file  Physical Activity:   . Days of Exercise per Week: Not on file  . Minutes of Exercise per Session: Not on file  Stress:   . Feeling of Stress : Not on file  Social Connections:   . Frequency of Communication with Friends and Family: Not on file  . Frequency of Social Gatherings with Friends and Family: Not on file  . Attends Religious Services: Not on file  . Active Member of Clubs or Organizations: Not on file  . Attends Banker Meetings: Not on file  . Marital Status: Not on file  Intimate Partner Violence:   . Fear of Current or Ex-Partner: Not on file  . Emotionally Abused: Not on file  . Physically Abused: Not on file  . Sexually Abused: Not on file     Allergies  Allergen Reactions  . Aspirin Shortness Of Breath    Causes asthma flares   . Penicillins Other (See Comments)    UNSPECIFIED REACTION OF CHILDHOOD  Has patient had a PCN reaction causing immediate rash, facial/tongue/throat swelling, SOB or lightheadedness  with hypotension: NO Has patient had a PCN reaction causing severe rash involving mucus membranes or skin necrosis: NO Has patient had a PCN reaction that required hospitalization: no Has patient had a PCN reaction occurring within the last 10 years: NO If all of the above answers are "NO", then may proceed with Cephalosporin use.   . Eggs Or Egg-Derived Products Diarrhea and Other (See Comments)    BLOATING > GAS  . Vicodin [Hydrocodone-Acetaminophen] Other (See Comments)    Headaches      Outpatient Medications Prior to Visit  Medication Sig Dispense Refill  . acetaminophen (TYLENOL) 500 MG tablet Take 1,000 mg by mouth every 6 (six) hours as needed for moderate pain.    Marland Kitchen albuterol (VENTOLIN HFA) 108 (90 BASE) MCG/ACT inhaler Inhale 2 puffs into the lungs every 4 (four) hours as needed for shortness of breath.     Marland Kitchen  aspirin-acetaminophen-caffeine (EXCEDRIN MIGRAINE) 250-250-65 MG tablet Take 2 tablets by mouth daily as needed for headache or migraine.     . bismuth subsalicylate (PEPTO BISMOL) 262 MG chewable tablet Chew 524 mg by mouth as needed for indigestion.    . budesonide (PULMICORT) 0.5 MG/2ML nebulizer solution Take 2 mLs (0.5 mg total) by nebulization 2 (two) times daily. 120 mL 12  . cetirizine (ZYRTEC) 10 MG tablet Take 10 mg by mouth daily.    . citalopram (CELEXA) 40 MG tablet Take 40 mg by mouth daily.     Marland Kitchen docusate sodium (COLACE) 100 MG capsule Take 100 mg by mouth daily as needed for mild constipation.     Marland Kitchen ipratropium-albuterol (DUONEB) 0.5-2.5 (3) MG/3ML SOLN Inhale 3 mLs into the lungs 4 (four) times daily.     Marland Kitchen levothyroxine (SYNTHROID, LEVOTHROID) 75 MCG tablet Take 75 mcg by mouth daily before breakfast.    . LORazepam (ATIVAN) 1 MG tablet Take 1 mg by mouth 2 (two) times daily as needed.    . montelukast (SINGULAIR) 10 MG tablet Take 1 tablet (10 mg total) by mouth at bedtime. 30 tablet 3  . pravastatin (PRAVACHOL) 20 MG tablet Take 20 mg by mouth daily.       . SUMAtriptan 6 MG/0.5ML SOAJ Inject 6 mg as directed daily as needed (migraine).     . traMADol (ULTRAM) 50 MG tablet Take 1 tablet (50 mg total) by mouth every 6 (six) hours as needed. (Patient taking differently: Take 50 mg by mouth every 6 (six) hours as needed for moderate pain. ) 60 tablet 5  . valACYclovir (VALTREX) 500 MG tablet Take 2,000 mg by mouth See admin instructions. Take 2000mg s at first sign of fever blister outbreak then take 500mg s daily until gone    . pantoprazole (PROTONIX) 40 MG tablet Take 1 tablet (40 mg total) by mouth daily. 90 tablet 1   No facility-administered medications prior to visit.        Objective:   Physical Exam Vitals:   05/21/19 0945  BP: 108/64  Pulse: 85  SpO2: 98%  Weight: 149 lb (67.6 kg)  Height: 5\' 5"  (1.651 m)   Gen: Pleasant, chronically ill, in no distress,  normal affect  ENT: No lesions,  mouth clear,  oropharynx clear, no postnasal drip  Neck: No JVD, no stridor  Lungs: No use of accessory muscles, very distant, bilateral exp wheezes  Cardiovascular: RRR, heart sounds normal, no murmur or gallops, no peripheral edema  Musculoskeletal: No deformities, no cyanosis or clubbing  Neuro: alert, awake, non focal  Skin: Warm, no lesions or rash      Assessment & Plan:  COPD (chronic obstructive pulmonary disease) (HCC) Continues to have significant exertional dyspnea, even baseline dyspnea that appears to be steroid responsive.  She has had on and off symptoms over the last year corresponding with prednisone administration.  She is now starting to feel increased dyspnea, increased cough, thick white mucus.  This is consistent with her prior pattern.  I think we should start low-dose daily prednisone to see if she gets benefit.  I would also like to repeat her pulmonary function testing.  Her most recent PFT showed surprisingly good FEV1 given her symptoms.  Continue DuoNeb, Pulmicort.  When she gets insurance/disability then we  can consider changing to long-acting bronchodilators.  Allergic rhinitis Continue Zyrtec and Singulair    Baltazar Apo, MD, PhD 05/21/2019, 3:05 PM [facility] [phone] or if no answer  [phone]  

## 2019-05-21 NOTE — Patient Instructions (Addendum)
Please start prednisone 20 mg daily for the next 2 weeks, then decrease to 10 mg daily and stay at that dose until we follow-up together. Continue DuoNeb 4 times a day on a schedule. Continue budesonide nebulizer (Pulmicort) 2 times a day on a schedule Use your albuterol either one nebulizer treatment or 2 puffs up to every 4 hours if needed for shortness of breath, chest tightness, wheezing. We will repeat your pulmonary function testing to compare with December 2019 Continue your Singulair and Zyrtec as you have been taking them. Flu shot up-to-date Follow with Dr. Delton Coombes next available with full pulmonary function testing on the same day.

## 2019-05-21 NOTE — Assessment & Plan Note (Signed)
Continues to have significant exertional dyspnea, even baseline dyspnea that appears to be steroid responsive.  She has had on and off symptoms over the last year corresponding with prednisone administration.  She is now starting to feel increased dyspnea, increased cough, thick white mucus.  This is consistent with her prior pattern.  I think we should start low-dose daily prednisone to see if she gets benefit.  I would also like to repeat her pulmonary function testing.  Her most recent PFT showed surprisingly good FEV1 given her symptoms.  Continue DuoNeb, Pulmicort.  When she gets insurance/disability then we can consider changing to long-acting bronchodilators.

## 2019-05-28 ENCOUNTER — Ambulatory Visit: Payer: Self-pay | Admitting: Emergency Medicine

## 2019-06-11 ENCOUNTER — Ambulatory Visit (HOSPITAL_COMMUNITY)
Admission: RE | Admit: 2019-06-11 | Discharge: 2019-06-11 | Disposition: A | Discharge status: Home / Self Care | Payer: Self-pay | Source: Ambulatory Visit | Attending: Internal Medicine | Admitting: Internal Medicine

## 2019-06-11 ENCOUNTER — Other Ambulatory Visit: Payer: Self-pay | Admitting: Internal Medicine

## 2019-06-11 ENCOUNTER — Other Ambulatory Visit: Payer: Self-pay

## 2019-06-11 ENCOUNTER — Other Ambulatory Visit (HOSPITAL_COMMUNITY): Payer: Self-pay | Admitting: Internal Medicine

## 2019-06-11 DIAGNOSIS — K9181 Other intraoperative complications of digestive system: Secondary | ICD-10-CM | POA: Insufficient documentation

## 2019-06-11 DIAGNOSIS — K5669 Other partial intestinal obstruction: Secondary | ICD-10-CM

## 2019-06-11 DIAGNOSIS — K831 Obstruction of bile duct: Secondary | ICD-10-CM

## 2019-06-12 ENCOUNTER — Ambulatory Visit (HOSPITAL_COMMUNITY): Admission: RE | Admit: 2019-06-12 | Payer: Self-pay | Source: Ambulatory Visit

## 2019-06-12 ENCOUNTER — Encounter (HOSPITAL_COMMUNITY): Payer: Self-pay

## 2019-06-18 ENCOUNTER — Other Ambulatory Visit: Payer: Self-pay | Admitting: Emergency Medicine

## 2019-07-21 ENCOUNTER — Telehealth: Payer: Self-pay | Admitting: Emergency Medicine

## 2019-07-21 NOTE — Telephone Encounter (Signed)
Called and spoke with pt and read to her the instructions about the things she is not to do the day she comes in for the PFT. Pt verbalized understanding. Nothing further needed.

## 2019-07-25 ENCOUNTER — Other Ambulatory Visit (HOSPITAL_COMMUNITY)
Admission: RE | Admit: 2019-07-25 | Discharge: 2019-07-25 | Disposition: A | Discharge status: Home / Self Care | Payer: HRSA Program | Source: Ambulatory Visit | Attending: Adult Health | Admitting: Adult Health

## 2019-07-25 ENCOUNTER — Other Ambulatory Visit (HOSPITAL_COMMUNITY): Payer: Self-pay

## 2019-07-25 DIAGNOSIS — Z01812 Encounter for preprocedural laboratory examination: Secondary | ICD-10-CM | POA: Diagnosis present

## 2019-07-25 DIAGNOSIS — Z20822 Contact with and (suspected) exposure to covid-19: Secondary | ICD-10-CM | POA: Diagnosis not present

## 2019-07-25 LAB — SARS CORONAVIRUS 2 (TAT 6-24 HRS): SARS Coronavirus 2: NEGATIVE

## 2019-07-29 ENCOUNTER — Ambulatory Visit (INDEPENDENT_AMBULATORY_CARE_PROVIDER_SITE_OTHER): Payer: Self-pay | Admitting: Emergency Medicine

## 2019-07-29 ENCOUNTER — Encounter: Payer: Self-pay | Admitting: Adult Health

## 2019-07-29 ENCOUNTER — Ambulatory Visit (INDEPENDENT_AMBULATORY_CARE_PROVIDER_SITE_OTHER): Payer: Self-pay | Admitting: Adult Health

## 2019-07-29 ENCOUNTER — Other Ambulatory Visit: Payer: Self-pay

## 2019-07-29 DIAGNOSIS — J449 Chronic obstructive pulmonary disease, unspecified: Secondary | ICD-10-CM

## 2019-07-29 DIAGNOSIS — J9611 Chronic respiratory failure with hypoxia: Secondary | ICD-10-CM

## 2019-07-29 LAB — PULMONARY FUNCTION TEST
DL/VA % pred: 91 %
DL/VA: 3.87 ml/min/mmHg/L
DLCO cor % pred: 65 %
DLCO cor: 14.3 ml/min/mmHg
DLCO unc % pred: 65 %
DLCO unc: 14.3 ml/min/mmHg
FEF 25-75 Post: 2.26 L/sec
FEF 25-75 Pre: 0.95 L/sec
FEF2575-%Change-Post: 137 %
FEF2575-%Pred-Post: 85 %
FEF2575-%Pred-Pre: 35 %
FEV1-%Change-Post: 27 %
FEV1-%Pred-Post: 79 %
FEV1-%Pred-Pre: 62 %
FEV1-Post: 2.25 L
FEV1-Pre: 1.76 L
FEV1FVC-%Change-Post: 6 %
FEV1FVC-%Pred-Pre: 83 %
FEV6-%Change-Post: 19 %
FEV6-%Pred-Post: 89 %
FEV6-%Pred-Pre: 74 %
FEV6-Post: 3.14 L
FEV6-Pre: 2.62 L
FEV6FVC-%Change-Post: 0 %
FEV6FVC-%Pred-Post: 100 %
FEV6FVC-%Pred-Pre: 101 %
FVC-%Change-Post: 19 %
FVC-%Pred-Post: 88 %
FVC-%Pred-Pre: 74 %
FVC-Post: 3.21 L
FVC-Pre: 2.68 L
Post FEV1/FVC ratio: 70 %
Post FEV6/FVC ratio: 98 %
Pre FEV1/FVC ratio: 66 %
Pre FEV6/FVC Ratio: 98 %
RV % pred: 124 %
RV: 2.44 L
TLC % pred: 91 %
TLC: 4.84 L

## 2019-07-29 MED ORDER — IPRATROPIUM-ALBUTEROL 0.5-2.5 (3) MG/3ML IN SOLN: 3.0000 mL | Freq: Four times a day (QID) | RESPIRATORY_TRACT | 5 refills | Status: DC

## 2019-07-29 NOTE — Progress Notes (Addendum)
@Patient  ID: [name], female    DOB: 1966-03-03, 54 y.o.   MRN: [id number]  Chief Complaint  Patient presents with  . Follow-up    COPD     Referring provider: Celene Squibb, MD  HPI: 54 year old female former smoker followed for COPD with asthma (eosinophilic phenotype), chronic hypoxic respiratory failure on oxygen at 4 L and chronic rhinitis Medical history significant for GERD Tension Pneumothorax 12/2001 s/p VATS w/ mechanical pleurodesis on right -Dr. Darcey Nora   TEST/EVENTS :  IgE 05/27/2018 >> 3590 Eosinophil count >> 800/mcL  07/29/2019 Follow-up : COPD, oxygen dependent respiratory failure, asthma, chronic rhinitis Patient returns for a 45-month follow-up.  Patient has underlying moderate COPD with allergic asthma (eosinophilic phenotype).  Last visit she continued to have increased symptom burden.  She was continued on DuoNeb 4 times daily.  Budesonide twice daily.  Last visit she was started on prednisone 10 mg daily .  She says she does feel that this has helped with her breathing.  She still gets very winded with minimal activity.  Patient had PFTs done today that show stable lung function since 2019.  Patient has moderate airflow obstruction and restriction with FEV1 at 62%, ratio 66, FVC 74%, significant bronchodilator response with positive reversibility, 27% change, significant mid flow reversibility (137% change), DLCO improved at 65%. Patient remains on oxygen at 4 L.  Previous chest x-ray November 2020 showed clear lungs.  He denies any increased cough congestion or wheezing.  Patient is on disability which was approved and March 2021 unfortunately she does not have insurance.  She can afford her current nebulizer regimen. Is her medications through the [facility]  Allergies  Allergen Reactions  . Aspirin Shortness Of Breath    Causes asthma flares   . Penicillins Other (See Comments)    UNSPECIFIED REACTION OF CHILDHOOD  Has patient had a PCN reaction  causing immediate rash, facial/tongue/throat swelling, SOB or lightheadedness with hypotension: NO Has patient had a PCN reaction causing severe rash involving mucus membranes or skin necrosis: NO Has patient had a PCN reaction that required hospitalization: no Has patient had a PCN reaction occurring within the last 10 years: NO If all of the above answers are "NO", then may proceed with Cephalosporin use.   . Eggs Or Egg-Derived Products Diarrhea and Other (See Comments)    BLOATING > GAS  . Vicodin [Hydrocodone-Acetaminophen] Other (See Comments)    Headaches     Immunization History  Administered Date(s) Administered  . Influenza-Unspecified 02/15/2017, 04/13/2019  . PFIZER SARS-COV-2 Vaccination 06/21/2019, 07/22/2019    Past Medical History:  Diagnosis Date  . Anxiety   . Asthma   . Cervical radiculopathy   . Cluster headaches   . COPD (chronic obstructive pulmonary disease) ([place])   . COPD (chronic obstructive pulmonary disease) with chronic bronchitis ([place])   . GERD (gastroesophageal reflux disease)   . Hyperlipemia   . Hypoglycemia   . Hypothyroidism   . Migraine   . Pneumothorax   . Rectal discharge 07/28/2010  . Shortness of breath   . Sleep apnea    cannot tolerate-not using CPAP  . Sluggishness 07/28/2010    Tobacco History: Social History   Tobacco Use  Smoking Status Former Smoker  . Packs/day: 1.00  . Years: 30.00  . Pack years: 30.00  . Types: Cigarettes  . Quit date: 02/21/2008  . Years since quitting: 11.4  Smokeless Tobacco Never Used   Counseling given: Not Answered   Outpatient  Medications Prior to Visit  Medication Sig Dispense Refill  . acetaminophen (TYLENOL) 500 MG tablet Take 1,000 mg by mouth every 6 (six) hours as needed for moderate pain.    Marland Kitchen albuterol (VENTOLIN HFA) 108 (90 BASE) MCG/ACT inhaler Inhale 2 puffs into the lungs every 4 (four) hours as needed for shortness of breath.     Marland Kitchen aspirin-acetaminophen-caffeine (EXCEDRIN  MIGRAINE) 250-250-65 MG tablet Take 2 tablets by mouth daily as needed for headache or migraine.     . bismuth subsalicylate (PEPTO BISMOL) 262 MG chewable tablet Chew 524 mg by mouth as needed for indigestion.    . budesonide (PULMICORT) 0.5 MG/2ML nebulizer solution Take 2 mLs (0.5 mg total) by nebulization 2 (two) times daily. 120 mL 12  . cetirizine (ZYRTEC) 10 MG tablet Take 10 mg by mouth daily.    . citalopram (CELEXA) 40 MG tablet Take 40 mg by mouth daily.     Marland Kitchen docusate sodium (COLACE) 100 MG capsule Take 100 mg by mouth daily as needed for mild constipation.     Marland Kitchen levothyroxine (SYNTHROID, LEVOTHROID) 75 MCG tablet Take 75 mcg by mouth daily before breakfast.    . LORazepam (ATIVAN) 1 MG tablet Take 1 mg by mouth 2 (two) times daily as needed.    . montelukast (SINGULAIR) 10 MG tablet Take 1 tablet (10 mg total) by mouth at bedtime. 30 tablet 3  . pravastatin (PRAVACHOL) 20 MG tablet Take 20 mg by mouth daily.     . predniSONE (DELTASONE) 10 MG tablet TAKE 1 TABLET DAILY UNTIL  FOLLOW-UP  WITH  DR.  Delton Coombes. 30 tablet 2  . SUMAtriptan 6 MG/0.5ML SOAJ Inject 6 mg as directed daily as needed (migraine).     . valACYclovir (VALTREX) 500 MG tablet Take 2,000 mg by mouth See admin instructions. Take 2000mg s at first sign of fever blister outbreak then take 500mg s daily until gone    . ipratropium-albuterol (DUONEB) 0.5-2.5 (3) MG/3ML SOLN Inhale 3 mLs into the lungs 4 (four) times daily.     . pantoprazole (PROTONIX) 40 MG tablet Take 1 tablet (40 mg total) by mouth daily. 90 tablet 1  . traMADol (ULTRAM) 50 MG tablet Take 1 tablet (50 mg total) by mouth every 6 (six) hours as needed. (Patient taking differently: Take 50 mg by mouth every 6 (six) hours as needed for moderate pain. ) 60 tablet 5   No facility-administered medications prior to visit.     Review of Systems:   Constitutional:   No  weight loss, night sweats,  Fevers, chills,  +fatigue, or  lassitude.  HEENT:   No headaches,   Difficulty swallowing,  Tooth/dental problems, or  Sore throat,                No sneezing, itching, ear ache, nasal congestion, post nasal drip,   CV:  No chest pain,  Orthopnea, PND, swelling in lower extremities, anasarca, dizziness, palpitations, syncope.   GI  No heartburn, indigestion, abdominal pain, nausea, vomiting, diarrhea, change in bowel habits, loss of appetite, bloody stools.   Resp:   No chest wall deformity  Skin: no rash or lesions.  GU: no dysuria, change in color of urine, no urgency or frequency.  No flank pain, no hematuria   MS:  No joint pain or swelling.  No decreased range of motion.  No back pain.    Physical Exam  BP 102/60 (BP Location: Left Arm, Cuff Size: Normal)   Pulse 82  Ht 5' 5.5" (1.664 m)   Wt 151 lb (68.5 kg)   SpO2 97%   BMI 24.75 kg/m   GEN: A/Ox3; pleasant , NAD, on oxygen   HEENT:  [place]/AT,  EACs-clear, TMs-wnl, NOSE-clear, THROAT-clear, no lesions, no postnasal drip or exudate noted.   NECK:  Supple w/ fair ROM; no JVD; normal carotid impulses w/o bruits; no thyromegaly or nodules palpated; no lymphadenopathy.    RESP  Clear  P & A; w/o, wheezes/ rales/ or rhonchi. no accessory muscle use, no dullness to percussion  CARD:  RRR, no m/r/g, no peripheral edema, pulses intact, no cyanosis or clubbing.  GI:   Soft & nt; nml bowel sounds; no organomegaly or masses detected.   Musco: Warm bil, no deformities or joint swelling noted.   Neuro: alert, no focal deficits noted.    Skin: Warm, no lesions or rashes    Lab Results:  CBC  BMET  No results found for: BNP  ProBNP No results found for: PROBNP  Imaging: No results found.    PFT Results Latest Ref Rng & Units 07/29/2019 03/26/2018  FVC-Pre L 2.68 2.93  FVC-Predicted Pre % 74 80  FVC-Post L 3.21 3.15  FVC-Predicted Post % 88 86  Pre FEV1/FVC % % 66 70  Post FEV1/FCV % % 70 71  FEV1-Pre L 1.76 2.04  FEV1-Predicted Pre % 62 71  FEV1-Post L 2.25 2.24  DLCO  UNC% % 65 46  DLCO COR %Predicted % 91 61  TLC L 4.84 5.36  TLC % Predicted % 91 101  RV % Predicted % 124 126    No results found for: NITRICOXIDE      Assessment & Plan:   COPD (chronic obstructive pulmonary disease) (HCC) COPD with severe asthma overlap Patient has an eosinophilic phenotype. PFT shows stable lung disease.  They do support her underlying diagnosis for asthma.  With significant reversibility.  Previous chest x-ray was clear in November 2020. Continue on current regimen.  Could consider going forward Biologics however without insurance it may be more difficult for approval. Long discussion regarding patient with chronic steroid use and her age with the potential steroid complications.  Plan  Patient Instructions  Continue on Duoneb Four times a day   Continue on Budesonide Neb Twice daily   Continue on Prednisone 10mg  daily  Low sweet and salt diet  Activity as tolerated.  Continue on Oxygen on 4l/m  Discuss with Primary MD that you are on chronic steroids.  Follow up with Dr.  In [place] Office in 2 months and As needed   Please contact office for sooner follow up if symptoms do not improve or worsen or seek emergency care          Chronic respiratory failure with hypoxia [facility]) Patient requires 4 L of oxygen-unclear why she has such high symptom oxygen burden. Her symptom burden and oxygen demands seem out of proportion to her underlying lung function. Could consider a high-resolution CT chest and alpha-1 testing going forward.  Would look for possible underlying emphysema and interstitial lung disease.  Would also recommend a 2D echo.  To rule out possible underlying pulmonary hypertension with her chronic lung disease Patient would like to hold for now as she does not have insurance and wants to discuss with her husband. Patient is to follow back up in 2 months she would like to be seen at our new office in [facility] as it is  closer to her home.  Plan  Patient Instructions  Continue on Duoneb Four times a day   Continue on Budesonide Neb Twice daily   Continue on Prednisone 10mg  daily  Low sweet and salt diet  Activity as tolerated.  Continue on Oxygen on 4l/m  Discuss with Primary MD that you are on chronic steroids.  Follow up with Dr.  In [place] Office in 2 months and As needed   Please contact office for sooner follow up if symptoms do not improve or worsen or seek emergency care            [place], NP 07/29/2019

## 2019-07-29 NOTE — Assessment & Plan Note (Signed)
COPD with severe asthma overlap Patient has an eosinophilic phenotype. PFT shows stable lung disease.  They do support her underlying diagnosis for asthma.  With significant reversibility.  Previous chest x-ray was clear in November 2020. Continue on current regimen.  Could consider going forward Biologics however without insurance it may be more difficult for approval. Long discussion regarding patient with chronic steroid use and her age with the potential steroid complications.  Plan  Patient Instructions  Continue on Duoneb Four times a day   Continue on Budesonide Neb Twice daily   Continue on Prednisone 10mg  daily  Low sweet and salt diet  Activity as tolerated.  Continue on Oxygen on 4l/m  Discuss with Primary MD that you are on chronic steroids.  Follow up with Dr.  In [place] Office in 2 months and As needed   Please contact office for sooner follow up if symptoms do not improve or worsen or seek emergency care

## 2019-07-29 NOTE — Assessment & Plan Note (Signed)
Patient requires 4 L of oxygen-unclear why she has such high symptom oxygen burden. Her symptom burden and oxygen demands seem out of proportion to her underlying lung function. Could consider a high-resolution CT chest and alpha-1 testing going forward.  Would look for possible underlying emphysema and interstitial lung disease.  Would also recommend a 2D echo.  To rule out possible underlying pulmonary hypertension with her chronic lung disease Patient would like to hold for now as she does not have insurance and wants to discuss with her husband. Patient is to follow back up in 2 months she would like to be seen at our new office in [facility] as it is closer to her home.  Plan  Patient Instructions  Continue on Duoneb Four times a day   Continue on Budesonide Neb Twice daily   Continue on Prednisone 10mg  daily  Low sweet and salt diet  Activity as tolerated.  Continue on Oxygen on 4l/m  Discuss with Primary MD that you are on chronic steroids.  Follow up with Dr.  In [place] Office in 2 months and As needed   Please contact office for sooner follow up if symptoms do not improve or worsen or seek emergency care

## 2019-07-29 NOTE — Progress Notes (Signed)
PFT done today. 

## 2019-07-29 NOTE — Patient Instructions (Addendum)
Continue on Duoneb Four times a day   Continue on Budesonide Neb Twice daily   Continue on Prednisone 10mg  daily  Low sweet and salt diet  Activity as tolerated.  Continue on Oxygen on 4l/m  Discuss with Primary MD that you are on chronic steroids.  Follow up with Dr.  In [place] Office in 2 months and As needed   Please contact office for sooner follow up if symptoms do not improve or worsen or seek emergency care

## 2019-07-31 ENCOUNTER — Telehealth: Payer: Self-pay | Admitting: Adult Health

## 2019-07-31 DIAGNOSIS — R0600 Dyspnea, unspecified: Secondary | ICD-10-CM

## 2019-07-31 DIAGNOSIS — R0609 Other forms of dyspnea: Secondary | ICD-10-CM

## 2019-07-31 NOTE — Telephone Encounter (Signed)
LMTCB x1 for pt.  

## 2019-07-31 NOTE — Telephone Encounter (Signed)
Spoke with pt. She is aware that [name] is okay with ordering echo. Order has been placed. Nothing further needed.

## 2019-07-31 NOTE — Telephone Encounter (Signed)
Yes can set up 2 D Echo .

## 2019-07-31 NOTE — Telephone Encounter (Signed)
Pt would like to proceed with Echo that was recommended at her last OV by Tammy.  Tammy - please advise if it's okay to place order. Thanks.

## 2019-08-05 NOTE — Telephone Encounter (Signed)
Echo ordered 4/23 Notes in the order state no pre-cert required, pt has no insurance  Patient emailing the office today asking when this will be scheduled.  PCCs please advise, thank you.

## 2019-08-11 ENCOUNTER — Ambulatory Visit (HOSPITAL_COMMUNITY)
Admission: RE | Admit: 2019-08-11 | Discharge: 2019-08-11 | Disposition: A | Discharge status: Home / Self Care | Payer: Self-pay | Source: Ambulatory Visit | Attending: Adult Health | Admitting: Adult Health

## 2019-08-11 ENCOUNTER — Other Ambulatory Visit: Payer: Self-pay

## 2019-08-11 DIAGNOSIS — R0600 Dyspnea, unspecified: Secondary | ICD-10-CM | POA: Insufficient documentation

## 2019-08-11 DIAGNOSIS — R0609 Other forms of dyspnea: Secondary | ICD-10-CM

## 2019-08-11 NOTE — Progress Notes (Signed)
*  PRELIMINARY RESULTS* Echocardiogram 2D Echocardiogram has been performed.  [name] 08/11/2019, 1:46 PM

## 2019-08-13 DIAGNOSIS — R0602 Shortness of breath: Secondary | ICD-10-CM

## 2019-08-13 NOTE — Telephone Encounter (Signed)
Please see 2D echo report, echo looks good.  Detailed information is on my result note from echo.  Did not appear to have pulmonary hypertension

## 2019-08-13 NOTE — Telephone Encounter (Signed)
Please advise on patients mychart message:  Hi, I was just wondering if the results of my echocardiogram has been looked at. I am really anxious about having pulmonary hypertension.

## 2019-08-14 ENCOUNTER — Other Ambulatory Visit: Payer: Self-pay

## 2019-08-14 ENCOUNTER — Encounter (HOSPITAL_COMMUNITY): Payer: Self-pay

## 2019-08-14 ENCOUNTER — Ambulatory Visit (HOSPITAL_COMMUNITY)
Admission: RE | Admit: 2019-08-14 | Discharge: 2019-08-14 | Disposition: A | Discharge status: Home / Self Care | Payer: Self-pay | Source: Ambulatory Visit | Attending: Adult Health | Admitting: Adult Health

## 2019-08-14 DIAGNOSIS — R0602 Shortness of breath: Secondary | ICD-10-CM | POA: Insufficient documentation

## 2019-08-14 MED ORDER — IOHEXOL 350 MG/ML SOLN
100.0000 mL | Freq: Once | INTRAVENOUS | Status: AC | PRN
  Administered 2019-08-14: 100 mL via INTRAVENOUS

## 2019-08-14 NOTE — Telephone Encounter (Signed)
Spoke with pt. She is aware of Tammy's message. Order has been placed CTa. Nothing further was needed.

## 2019-08-14 NOTE — Telephone Encounter (Signed)
2nd message from patient. I asked her if she is exerting herself during that time  Sometimes I am active and some not. My rate has been as high as 178. That happened when I was outside just walking around in the yard for a very short time.

## 2019-08-14 NOTE — Telephone Encounter (Signed)
Symptoms of been going on for greater than 6 months.  She does have COPD and asthma but continues to have increased symptom burden and oxygen dependence not typical of her underlying moderate COPD.  Unclear why she continues to have tachycardia and hypoxemia requiring 4 L of oxygen.  Echo was unrevealing.  She does have a history of pneumothorax with pleurodesis.  Recent chest x-ray with no acute process noted.  We will check a CT chest PE protocol to look for other etiology to explain her dyspnea and hypoxemia.  Recent lab work last week showed creatinine of 0.93  Re: order for CT chest PE protcol .

## 2019-08-14 NOTE — Telephone Encounter (Signed)
If her HR is this high she needs to go to ER for immediate evaluation  I do not have an answer for tachycardia with activity , Echo was unrevealing. I am glad to talk to her on the phone when I finish clinic for the morning . Do think she needs Cardiology referral If HR is this High needs ER now .  Please contact office for sooner follow up if symptoms do not improve or worsen or seek emergency care

## 2019-08-14 NOTE — Telephone Encounter (Signed)
[name] please advise:  This is good news. Does it show anything that causes my heart rate to go so high when I get short of breathe?

## 2019-08-25 DIAGNOSIS — R0609 Other forms of dyspnea: Secondary | ICD-10-CM

## 2019-08-25 DIAGNOSIS — R0602 Shortness of breath: Secondary | ICD-10-CM

## 2019-08-25 DIAGNOSIS — R0600 Dyspnea, unspecified: Secondary | ICD-10-CM

## 2019-08-26 NOTE — Telephone Encounter (Signed)
Order has been placed. Pt has been made aware. Nothing further needed.

## 2019-08-26 NOTE — Telephone Encounter (Signed)
[name]- pt states that you told her you would be referring her to cardiologist in [place]. Please advise on referral and dx, thanks!

## 2019-08-26 NOTE — Telephone Encounter (Signed)
Okay not sure why that did not go through, will send asap  Please refer to Cardiology for dyspnea - [place] location   Thanks so much

## 2019-09-11 ENCOUNTER — Other Ambulatory Visit (HOSPITAL_COMMUNITY): Payer: Self-pay | Admitting: Internal Medicine

## 2019-09-11 DIAGNOSIS — Z1231 Encounter for screening mammogram for malignant neoplasm of breast: Secondary | ICD-10-CM

## 2019-09-15 ENCOUNTER — Encounter: Payer: Self-pay | Admitting: *Deleted

## 2019-09-15 ENCOUNTER — Encounter: Payer: Self-pay | Admitting: Cardiology

## 2019-09-15 ENCOUNTER — Ambulatory Visit (INDEPENDENT_AMBULATORY_CARE_PROVIDER_SITE_OTHER): Payer: Self-pay | Admitting: Cardiology

## 2019-09-15 ENCOUNTER — Other Ambulatory Visit: Payer: Self-pay

## 2019-09-15 VITALS — BP 102/64 | HR 84 | Ht 64.5 in | Wt 149.4 lb

## 2019-09-15 DIAGNOSIS — R079 Chest pain, unspecified: Secondary | ICD-10-CM

## 2019-09-15 DIAGNOSIS — R002 Palpitations: Secondary | ICD-10-CM

## 2019-09-15 NOTE — Progress Notes (Signed)
Clinical Summary Ms. Wilmarth is a 54 y.o.female seen as new consult, referred by NP Parrett for dyspnea.   1. Dyspnea - 08/2019 echo: LVEF 81-19%, normal diastolic function. Normal RV function, normal RA. Normal IVC. Could not measure PASP but with normal right sided findings and IVC unlikely any significant pulm HTN -CT PE no PE, no acute process   2. COPD - followed by pulmonary - she is on 4L [place]  3. Chest pain - midchest tightess, squeezign pain. At rest or with exertion. Can have coughing, +wheezing. Will improve inhaler or nebulizer. Tightness can last all day long.    4. Elevated HRs - can notice HRs in 150s with exertion, 130s at rest - can have some palpitations at times - limited caffeine  Past Medical History:  Diagnosis Date  . Anxiety   . Asthma   . Cervical radiculopathy   . Cluster headaches   . COPD (chronic obstructive pulmonary disease) ([place])   . COPD (chronic obstructive pulmonary disease) with chronic bronchitis ([place])   . GERD (gastroesophageal reflux disease)   . Hyperlipemia   . Hypoglycemia   . Hypothyroidism   . Migraine   . Pneumothorax   . Rectal discharge 07/28/2010  . Shortness of breath   . Sleep apnea    cannot tolerate-not using CPAP  . Sluggishness 07/28/2010     Allergies  Allergen Reactions  . Aspirin Shortness Of Breath    Causes asthma flares   . Penicillins Other (See Comments)    UNSPECIFIED REACTION OF CHILDHOOD  Has patient had a PCN reaction causing immediate rash, facial/tongue/throat swelling, SOB or lightheadedness with hypotension: NO Has patient had a PCN reaction causing severe rash involving mucus membranes or skin necrosis: NO Has patient had a PCN reaction that required hospitalization: no Has patient had a PCN reaction occurring within the last 10 years: NO If all of the above answers are "NO", then may proceed with Cephalosporin use.   . Eggs Or Egg-Derived Products Diarrhea and Other (See Comments)     BLOATING > GAS  . Vicodin [Hydrocodone-Acetaminophen] Other (See Comments)    Headaches      Current Outpatient Medications  Medication Sig Dispense Refill  . acetaminophen (TYLENOL) 500 MG tablet Take 1,000 mg by mouth every 6 (six) hours as needed for moderate pain.    Marland Kitchen albuterol (VENTOLIN HFA) 108 (90 BASE) MCG/ACT inhaler Inhale 2 puffs into the lungs every 4 (four) hours as needed for shortness of breath.     Marland Kitchen aspirin-acetaminophen-caffeine (EXCEDRIN MIGRAINE) 250-250-65 MG tablet Take 2 tablets by mouth daily as needed for headache or migraine.     . bismuth subsalicylate (PEPTO BISMOL) 262 MG chewable tablet Chew 524 mg by mouth as needed for indigestion.    . budesonide (PULMICORT) 0.5 MG/2ML nebulizer solution Take 2 mLs (0.5 mg total) by nebulization 2 (two) times daily. 120 mL 12  . cetirizine (ZYRTEC) 10 MG tablet Take 10 mg by mouth daily.    . citalopram (CELEXA) 40 MG tablet Take 40 mg by mouth daily.     Marland Kitchen docusate sodium (COLACE) 100 MG capsule Take 100 mg by mouth daily as needed for mild constipation.     Marland Kitchen ipratropium-albuterol (DUONEB) 0.5-2.5 (3) MG/3ML SOLN Inhale 3 mLs into the lungs 4 (four) times daily. 360 mL 5  . levothyroxine (SYNTHROID, LEVOTHROID) 75 MCG tablet Take 75 mcg by mouth daily before breakfast.    . LORazepam (ATIVAN) 1 MG tablet Take  1 mg by mouth 2 (two) times daily as needed.    . montelukast (SINGULAIR) 10 MG tablet Take 1 tablet (10 mg total) by mouth at bedtime. 30 tablet 3  . pantoprazole (PROTONIX) 40 MG tablet Take 1 tablet (40 mg total) by mouth daily. 90 tablet 1  . pravastatin (PRAVACHOL) 20 MG tablet Take 20 mg by mouth daily.     . predniSONE (DELTASONE) 10 MG tablet TAKE 1 TABLET DAILY UNTIL  FOLLOW-UP  WITH  DR.  Delton Coombes. 30 tablet 2  . SUMAtriptan 6 MG/0.5ML SOAJ Inject 6 mg as directed daily as needed (migraine).     . valACYclovir (VALTREX) 500 MG tablet Take 2,000 mg by mouth See admin instructions. Take 2000mg s at first sign of  fever blister outbreak then take 500mg s daily until gone     No current facility-administered medications for this visit.     Past Surgical History:  Procedure Laterality Date  . ABDOMINAL HYSTERECTOMY     with bladder suspension  . CERVICAL FUSION    . CHOLECYSTECTOMY    . COLONOSCOPY N/A 07/30/2013   Procedure: COLONOSCOPY;  Surgeon: , MD;  Location: [facility];  Service: Endoscopy;  Laterality: N/A;  930  . EXAM UNDER ANESTHESIA WITH MANIPULATION OF SHOULDER Right 10/11/2016   Procedure: EXAM UNDER ANESTHESIA WITH MANIPULATION OF SHOULDER;  Surgeon: Malissa Hippo, MD;  Location: [facility];  Service: Orthopedics;  Laterality: Right;  . INCISIONAL HERNIA REPAIR N/A 07/30/2016   Procedure: HERNIA REPAIR INCISIONAL WITH MESH;  Surgeon: Vickki Hearing, MD;  Location: [facility];  Service: General;  Laterality: N/A;  . LOBECTOMY     right lung   . POSTERIOR CERVICAL LAMINECTOMY Right 04/05/2017   Procedure: Right C6-7 Posterior cervical laminectomy;  Surgeon: [name], MD;  Location[facility];  Service: Neurosurgery;  Laterality: Right;  Right C6-7 Posterior cervical laminectomy  . SHOULDER ARTHROSCOPY WITH ROTATOR CUFF REPAIR Right 10/11/2016   Procedure: SHOULDER ARTHROSCOPY;  Surgeon: [facility], MD;  Location: [facility];  Service: Orthopedics;  Laterality: Right;  . TUBAL LIGATION       Allergies  Allergen Reactions  . Aspirin Shortness Of Breath    Causes asthma flares   . Penicillins Other (See Comments)    UNSPECIFIED REACTION OF CHILDHOOD  Has patient had a PCN reaction causing immediate rash, facial/tongue/throat swelling, SOB or lightheadedness with hypotension: NO Has patient had a PCN reaction causing severe rash involving mucus membranes or skin necrosis: NO Has patient had a PCN reaction that required hospitalization: no Has patient had a PCN reaction occurring within the last 10 years: NO If all of the above answers are "NO", then may proceed with  Cephalosporin use.   . Eggs Or Egg-Derived Products Diarrhea and Other (See Comments)    BLOATING > GAS  . Vicodin [Hydrocodone-Acetaminophen] Other (See Comments)    Headaches       Family History  Problem Relation Age of Onset  . COPD Mother   . Diabetes Mother   . Hyperlipidemia Mother   . Hypertension Mother   . Bipolar disorder Son   . Heart failure Maternal Grandmother   . Hypertension Maternal Grandmother   . Thyroid disease Maternal Grandmother   . Diabetes Paternal Grandmother   . Alzheimer's disease Paternal Grandmother   . Heart failure Paternal Grandfather   . Hypertension Paternal Grandfather   . Hypertension Father   . Colon cancer Neg Hx      Social History  Ms. Malcolm reports that she quit smoking about 11 years ago. Her smoking use included cigarettes. She has a 30.00 pack-year smoking history. She has never used smokeless tobacco. Ms. Silvestri reports no history of alcohol use.   Review of Systems CONSTITUTIONAL: No weight loss, fever, chills, weakness or fatigue.  HEENT: Eyes: No visual loss, blurred vision, double vision or yellow sclerae.No hearing loss, sneezing, congestion, runny nose or sore throat.  SKIN: No rash or itching.  CARDIOVASCULAR: per hpi RESPIRATORY: per hpi GASTROINTESTINAL: No anorexia, nausea, vomiting or diarrhea. No abdominal pain or blood.  GENITOURINARY: No burning on urination, no polyuria NEUROLOGICAL: No headache, dizziness, syncope, paralysis, ataxia, numbness or tingling in the extremities. No change in bowel or bladder control.  MUSCULOSKELETAL: No muscle, back pain, joint pain or stiffness.  LYMPHATICS: No enlarged nodes. No history of splenectomy.  PSYCHIATRIC: No history of depression or anxiety.  ENDOCRINOLOGIC: No reports of sweating, cold or heat intolerance. No polyuria or polydipsia.  Marland Kitchen   Physical Examination Today's Vitals   09/15/19 1318  BP: 102/64  Pulse: 84  SpO2: 94%  Weight: 149 lb 6.4 oz (67.8  kg)  Height: 5' 4.5" (1.638 m)   Body mass index is 25.25 kg/m.  Gen: resting comfortably, no acute distress HEENT: no scleral icterus, pupils equal round and reactive, no palptable cervical adenopathy,  CV: RRR, no m/r/g, no jvd Resp: Clear to auscultation bilaterally GI: abdomen is soft, non-tender, non-distended, normal bowel sounds, no hepatosplenomegaly MSK: extremities are warm, no edema.  Skin: warm, no rash Neuro:  no focal deficits Psych: appropriate affect   Diagnostic Studies  08/2019 CT PE IMPRESSION: 1. No evidence of pulmonary embolism. 2. No acute cardiopulmonary process.  These results will be called to the ordering clinician or representative by the Radiologist Assistant, and communication documented in the PACS or [employer].   08/2019 echo IMPRESSIONS    1. Left ventricular ejection fraction, by estimation, is 60 to 65%. The  left ventricle has normal function. The left ventricle has no regional  wall motion abnormalities. Left ventricular diastolic parameters were  normal.  2. Right ventricular systolic function is normal. The right ventricular  size is normal. Tricuspid regurgitation signal is inadequate for assessing  PA pressure.  3. The mitral valve is grossly normal. No evidence of mitral valve  regurgitation.  4. The aortic valve is tricuspid. Aortic valve regurgitation is not  visualized.  5. The inferior vena cava is normal in size with greater than 50%  respiratory variability, suggesting right atrial pressure of 3 mmHg.   Assessment and Plan  1. Dyspnea - appears to be primarily pulmonary related - her echo showed a normal structural and function heart, did not get a PASP but with normal RA/RV/IVC highly unlikely that is an issue - her chest pain is not consistnet with angina, lasts hours at a time, better with bronchodilators. Do not see indication for ischemic testing as cause of her symptoms.  - she does have some elevated  HRs at rest and with exertion, some palpitations. Will obtain a 7 day event monitor to evlauate for arrhythmia. May just be a cardiac response to her chronic hypoxia but with her lung disease she has some increased risk for cardiac arrhythmias EKG today shows NSR  F/u pending 7 day event monitor, if benign monitor would f/u just as needed.       Antoine Poche, M.D.

## 2019-09-15 NOTE — Patient Instructions (Addendum)
Medication Instructions:   Your physician recommends that you continue on your current medications as directed. Please refer to the Current Medication list given to you today.  Labwork:  NONE  Testing/Procedures: Your physician has recommended that you wear an event monitor for 7 days. Event monitors are medical devices that record the heart's electrical activity. Doctors most often [place] these monitors to diagnose arrhythmias. Arrhythmias are problems with the speed or rhythm of the heartbeat. The monitor is a small, portable device. You can wear one while you do your normal daily activities. This is usually used to diagnose what is causing palpitations/syncope (passing out). [employer] will mail this monitor to your home address.  Follow-Up:  Your physician recommends that you schedule a follow-up appointment in: pending.  Any Other Special Instructions Will Be Listed Below (If Applicable).  If you need a refill on your cardiac medications before your next appointment, please call your pharmacy.

## 2019-09-28 ENCOUNTER — Ambulatory Visit (INDEPENDENT_AMBULATORY_CARE_PROVIDER_SITE_OTHER): Payer: Self-pay

## 2019-09-28 DIAGNOSIS — R002 Palpitations: Secondary | ICD-10-CM

## 2019-10-02 ENCOUNTER — Ambulatory Visit (HOSPITAL_COMMUNITY): Payer: Self-pay

## 2019-10-04 ENCOUNTER — Other Ambulatory Visit: Payer: Self-pay | Admitting: Emergency Medicine

## 2019-10-08 ENCOUNTER — Other Ambulatory Visit: Payer: Self-pay

## 2019-10-08 ENCOUNTER — Ambulatory Visit (HOSPITAL_COMMUNITY)
Admission: RE | Admit: 2019-10-08 | Discharge: 2019-10-08 | Disposition: A | Discharge status: Home / Self Care | Payer: 59 | Source: Ambulatory Visit | Attending: Internal Medicine | Admitting: Internal Medicine

## 2019-10-08 DIAGNOSIS — Z1231 Encounter for screening mammogram for malignant neoplasm of breast: Secondary | ICD-10-CM | POA: Insufficient documentation

## 2019-10-20 ENCOUNTER — Telehealth: Payer: Self-pay | Admitting: Cardiology

## 2019-10-20 NOTE — Telephone Encounter (Signed)
Event monitor results in pt chart - will forward to provider

## 2019-10-20 NOTE — Telephone Encounter (Signed)
New message   Patient calling to check results for her monitor - she mailed it back a month ago and have not heard from anyone

## 2019-10-21 ENCOUNTER — Telehealth: Payer: Self-pay | Admitting: *Deleted

## 2019-10-21 NOTE — Telephone Encounter (Signed)
-----   Message from Antoine Poche, MD sent at 10/21/2019  1:23 PM EDT ----- Normal heart monitor, no significant arrhythmias. Her elevated heart rates are likely due to her COPD, and the overall higher demand on her body even with mild activities, there were no arrhythmias detected. F/u 6 months   Dominga Ferry MD

## 2019-10-21 NOTE — Telephone Encounter (Signed)
Should be a result note from today  Dominga Ferry MD

## 2019-10-21 NOTE — Telephone Encounter (Signed)
Lesle Chris, LPN  7/86/7544 9:20 PM EDT Back to Top    Notified, copy to pcp & pulm (Dr. Sherene Sires).

## 2019-11-16 ENCOUNTER — Ambulatory Visit: Payer: Self-pay | Admitting: Internal Medicine

## 2019-11-17 ENCOUNTER — Encounter: Payer: Self-pay | Admitting: Internal Medicine

## 2019-11-17 ENCOUNTER — Ambulatory Visit (INDEPENDENT_AMBULATORY_CARE_PROVIDER_SITE_OTHER): Payer: 59 | Admitting: Internal Medicine

## 2019-11-17 ENCOUNTER — Other Ambulatory Visit: Payer: Self-pay

## 2019-11-17 DIAGNOSIS — J449 Chronic obstructive pulmonary disease, unspecified: Secondary | ICD-10-CM | POA: Diagnosis not present

## 2019-11-17 DIAGNOSIS — J9611 Chronic respiratory failure with hypoxia: Secondary | ICD-10-CM

## 2019-11-17 MED ORDER — MONTELUKAST SODIUM 10 MG PO TABS: 10.0000 mg | ORAL_TABLET | Freq: Every day | ORAL | 11 refills | Status: DC

## 2019-11-17 MED ORDER — PANTOPRAZOLE SODIUM 40 MG PO TBEC: DELAYED_RELEASE_TABLET | ORAL | Status: DC

## 2019-11-17 MED ORDER — STIOLTO RESPIMAT 2.5-2.5 MCG/ACT IN AERS
2.0000 | INHALATION_SPRAY | Freq: Every day | RESPIRATORY_TRACT | 0 refills | Status: DC
Start: 2019-11-17 — End: 2020-03-07

## 2019-11-17 MED ORDER — ALBUTEROL SULFATE (2.5 MG/3ML) 0.083% IN NEBU
2.5000 mg | INHALATION_SOLUTION | RESPIRATORY_TRACT | 12 refills | Status: DC | PRN
Start: 2019-11-17 — End: 2020-11-23

## 2019-11-17 MED ORDER — PREDNISONE 10 MG PO TABS
ORAL_TABLET | ORAL | 0 refills | Status: DC
Start: 2019-11-17 — End: 2020-01-19

## 2019-11-17 NOTE — Progress Notes (Addendum)
@Patient  ID: , female    DOB: Nov 09, 1965     MRN: [id number]    Referring provider: [id number], MD  Brief patient profile:  38 yowf  Quit smoking 2009  previously  followed for COPD GOLD II (but really more of an AB /eosinophilic phenotype with pfts nearly nl p saba), dx of chronic hypoxic respiratory failure on oxygen at 4 L and chronic rhinitis Medical history significant for GERD Tension Pneumothorax 12/2001 s/p VATS w/ mechanical pleurodesis on right -Dr. 01/2002    TEST/EVENTS :  IgE 05/27/2018   3590 with Eosinophil count 800/mcL > 05/29/2018   07/29/2019 Follow-up : COPD, oxygen dependent respiratory failure, asthma, chronic rhinitis Patient returns for a 64-month follow-up.  Patient has underlying moderate COPD with allergic asthma (eosinophilic phenotype).  Last visit she continued to have increased symptom burden.  She was continued on DuoNeb 4 times daily.  Budesonide twice daily.  Last visit she was started on prednisone 10 mg daily .  She says she does feel that this has helped with her breathing.  She still gets very winded with minimal activity.  Patient had PFTs show stable lung function since 2019.  Patient has moderate airflow obstruction and restriction with FEV1 at 62%, ratio 66, FVC 74%, significant bronchodilator response with positive reversibility, 27% change, significant mid flow reversibility (137% change), DLCO improved at 65%. Patient remains on oxygen at 4 L.  Previous chest x-ray November 2020 showed clear lungs.  He denies any increased cough congestion or wheezing.  Patient is on disability which was approved and March 2021 unfortunately she does not have insurance.  She can afford her current nebulizer regimen. Is her medications through the good Rx rec Continue on Duoneb Four times a day   Continue on Budesonide Neb Twice daily   Continue on Prednisone 10mg  daily  Low sweet and salt diet  Activity as tolerated.  Continue on Oxygen on  4l/m    11/17/2019  Consultaton as 2nd opinion/[place] office/Catalia Massett re: AB/ 02 dep resp failure  Chief Complaint  Patient presents with  . Consult    Shortness of breath with exertion,   Dyspnea:  Doesn't do grocery shopping x sev years  [place] time walked was [place] "before got  Hot" on 4lpm and sats 93% she reports while walking  Cough: esp in am / gray  Sleeping: ativan at hs / on side bed is flat/ 2 pillows / 3 x weekly wakes up feeling need for more saba SABA use: "all the time"  02: 4lpm 24/7 though POC at 4 pulsed outside home  Prednisone since jan 2021 but out x sev weeks and much worse even on bud neb   No obvious day to day or daytime variability or assoc   purulent sputum or mucus plugs or hemoptysis or cp or chest tightness, subjective wheeze or overt sinus or hb symptoms.     Also denies any obvious fluctuation of symptoms with weather or environmental changes or other aggravating or alleviating factors except as outlined above   No unusual exposure hx or h/o childhood pna/ asthma or knowledge of premature birth.  Current Allergies, Complete Past Medical History, Past Surgical History, Family History, and Social History were reviewed in 08-06-1998 record.  ROS  The following are not active complaints unless bolded Hoarseness, sore throat, dysphagia, dental problems, itching, sneezing,  nasal congestion or discharge of excess mucus or purulent secretions, ear ache,   fever,  chills, sweats, unintended wt loss or wt gain, classically pleuritic or exertional cp,  orthopnea pnd or arm/hand swelling  or leg swelling, presyncope, palpitations, abdominal pain, anorexia, nausea, vomiting, diarrhea  or change in bowel habits or change in bladder habits, change in stools or change in urine, dysuria, hematuria,  rash, arthralgias, visual complaints, headache, numbness/ R leg sciatica , weakness or ataxia or problems with walking or coordination,  change in  mood or  memory.        Current Meds  Medication Sig  . acetaminophen (TYLENOL) 500 MG tablet Take 1,000 mg by mouth every 6 (six) hours as needed for moderate pain.  Marland Kitchen albuterol (VENTOLIN HFA) 108 (90 BASE) MCG/ACT inhaler Inhale 2 puffs into the lungs every 4 (four) hours as needed for shortness of breath.   Marland Kitchen aspirin-acetaminophen-caffeine (EXCEDRIN MIGRAINE) 250-250-65 MG tablet Take 2 tablets by mouth daily as needed for headache or migraine.   . bismuth subsalicylate (PEPTO BISMOL) 262 MG chewable tablet Chew 524 mg by mouth as needed for indigestion.  . budesonide (PULMICORT) 0.5 MG/2ML nebulizer solution Take 2 mLs (0.5 mg total) by nebulization 2 (two) times daily.  . cetirizine (ZYRTEC) 10 MG tablet Take 10 mg by mouth daily.  . citalopram (CELEXA) 40 MG tablet Take 40 mg by mouth daily.   Marland Kitchen docusate sodium (COLACE) 100 MG capsule Take 100 mg by mouth daily as needed for mild constipation.   . EUTHYROX 100 MCG tablet Take 100 mcg by mouth daily.  Marland Kitchen ipratropium-albuterol (DUONEB) 0.5-2.5 (3) MG/3ML SOLN Inhale 3 mLs into the lungs 4 (four) times daily.  Marland Kitchen LORazepam (ATIVAN) 1 MG tablet Take 1 mg by mouth 2 (two) times daily as needed.  . pantoprazole (PROTONIX) 40 MG tablet Take 40 mg by mouth 2 (two) times daily.  . pravastatin (PRAVACHOL) 40 MG tablet Take 40 mg by mouth daily.  . SUMAtriptan 6 MG/0.5ML SOAJ Inject 6 mg as directed daily as needed (migraine).   . valACYclovir (VALTREX) 500 MG tablet Take 2,000 mg by mouth See admin instructions. Take 2000mg s at first sign of fever blister outbreak then take 500mg s daily until gone               Past Medical History:  Diagnosis Date  . Anxiety   . Asthma   . Cervical radiculopathy   . Cluster headaches   . COPD (chronic obstructive pulmonary disease) ([facility])   . COPD (chronic obstructive pulmonary disease) with chronic bronchitis ([facility])   . GERD (gastroesophageal reflux disease)   . Hyperlipemia   . Hypoglycemia   .  Hypothyroidism   . Migraine   . Pneumothorax   . Rectal discharge 07/28/2010  . Shortness of breath   . Sleep apnea    cannot tolerate-not using CPAP  . Sluggishness 07/28/2010        Physical Exam  Wt Readings from Last 3 Encounters:  11/17/19 146 lb 12.8 oz (66.6 kg)  09/15/19 149 lb 6.4 oz (67.8 kg)  07/29/19 151 lb (68.5 kg)    amb slt anxious wf nad   Vital signs reviewed - Note on arrival 11/17/2019  02 sats  99% on 4lpm POC  HEENT : pt wearing mask not removed for exam due to covid - 19 concerns.   NECK :  without JVD/Nodes/TM/ nl carotid upstrokes bilaterally   LUNGS: no acc muscle use,  Min barrel  contour chest wall with bilateral  slightly decreased bs s audible wheeze and  without cough  on insp or exp maneuvers and min  Hyperresonant  to  percussion bilaterally     CV:  RRR  no s3 or murmur or increase in P2, and no edema   ABD:  soft and nontender with pos end  insp Hoover's  in the supine position. No bruits or organomegaly appreciated, bowel sounds nl  MS:  Awkward  gait/  ext warm without deformities, calf tenderness, cyanosis or clubbing No obvious joint restrictions   SKIN: warm and dry without lesions    NEURO:  alert, approp, nl sensorium with  no motor or cerebellar deficits apparent.           I personally reviewed images and agree with radiology impression as follows:   Chest CTa 08/14/19  1. No evidence of pulmonary embolism. 2. No acute cardiopulmonary process.   Assessment & Plan:

## 2019-11-17 NOTE — Assessment & Plan Note (Addendum)
Quit smoking 2009  - IgE 05/27/2018   3590 with Eosinophil count 800/mcL > [place]  - PFT's 07/29/2019  FEV1 2.25 (79 % ) ratio 0.70  p 27 % improvement from saba p 0 prior to study with DLCO  14.30 (65%) corrects to 3.87 (91%)  for alv volume and FV curve min curvature   - 11/17/2019  After extensive coaching inhaler device,  effectiveness =    90% with smi so try stiolto x 6 weeks and pred 20  Until better then 10 mg daily plus maint singulair   This is not really copd but more of a chronic AB picture probably driven by eos inflammation but for now can't afford biologics so:  1) stiolto 2 each am for now and then see what the most affordable alternative is once we've established how much of here probems are really airway related as the airway component should be fully reversible 24/7 and lead to much better QOL than is the case presently ? Due to chronic over use of saba I spent extra time with pt today reviewing appropriate use of albuterol for prn use on exertion with the following points: 1) saba is for relief of sob that does not improve by walking a slower pace or resting but rather if the pt does not improve after trying this first. 2) If the pt is convinced, as many are, that saba helps recover from activity faster then it's easy to tell if this is the case by re-challenging : ie stop, take the inhaler, then p 5 minutes try the exact same activity (intensity of workload) that just caused the symptoms and see if they are substantially diminished or not after saba 3) if there is an activity that reproducibly causes the symptoms, try the saba 15 min before the activity on alternate days   If in fact the saba really does help, then fine to continue to use it prn but advised may need to look closer at the maintenance regimen being used to achieve better control of airways disease with exertion.   2) backup #1 with saba hfa prn and back that up with neb saba prn with goal of not needing at all  3)  continue singulair  4) The goal with a chronic steroid dependent illness is always arriving at the lowest effective dose that controls the disease/symptoms and not accepting a set "formula" which is based on statistics or guidelines that don't always take into account patient  variability or the natural hx of the dz in every individual patient, which may well vary over time.  For now therefore I recommend the patient maintain  20 mg until better then 10 mg daily and consider taper off once we're sure what meds she can tol/ ok off bud for now as barely affordable

## 2019-11-17 NOTE — Assessment & Plan Note (Addendum)
11/17/2019   Walked 4lpm her POC  approx   300 ft  @ slow/[place]  stopped due to  Sob and R hip and leg pain with sats still 98%     Appears to be treating sob with 02 rather than low sats with large conditioning/ anxiety component  :  Advised:  Make sure you check your oxygen saturations at highest level of activity to be sure it stays over 90% and adjust upward to maintain this level if needed but remember to turn it back to previous settings when you stop (to conserve your supply).           Each maintenance medication was reviewed in detail including emphasizing most importantly the difference between maintenance and prns and under what circumstances the prns are to be triggered using an action plan format where appropriate.  Total time for H and P, chart review, counseling, teaching device  directly observing portions of ambulatory 02 saturation study/  and generating customized AVS unique to this new pt office visit / charting = 100 mn

## 2019-11-17 NOTE — Patient Instructions (Addendum)
Make sure you check your oxygen saturations at highest level of activity to be sure it stays over 90% and adjust upward to maintain this level if needed but remember to turn it back to previous settings when you stop (to conserve your supply).   Stop budesonide and duoneb   Plan A = Automatic = Always=    Stiolto 2 puffs first each am   Prednisone 10 mg 2 until better then 1 daily   singulair 10 mg each pm   Plan B = Backup (to supplement plan A, not to replace it) Only use your albuterol inhaler as a rescue medication to be used if you can't catch your breath by resting or doing a relaxed purse lip breathing pattern.  - The less you use it, the better it will work when you need it. - Ok to use the inhaler up to 2 puffs  every 4 hours if you must but call for appointment if use goes up over your usual need - Don't leave home without it !!  (think of it like the spare tire for your car)   Plan C = Crisis (instead of Plan B but only if Plan B stops working) - only use your albuterol nebulizer if you first try Plan B and it fails to help > ok to use the nebulizer up to every 4 hours but if start needing it regularly call for immediate appointment   Please schedule a follow up office visit in 4 weeks, sooner if needed

## 2019-12-31 ENCOUNTER — Ambulatory Visit: Payer: Self-pay | Admitting: Internal Medicine

## 2020-01-01 ENCOUNTER — Other Ambulatory Visit: Payer: Self-pay

## 2020-01-01 ENCOUNTER — Other Ambulatory Visit (HOSPITAL_COMMUNITY): Payer: Self-pay | Admitting: Internal Medicine

## 2020-01-01 ENCOUNTER — Ambulatory Visit (HOSPITAL_COMMUNITY)
Admission: RE | Admit: 2020-01-01 | Discharge: 2020-01-01 | Disposition: A | Discharge status: Home / Self Care | Payer: 59 | Source: Ambulatory Visit | Attending: Internal Medicine | Admitting: Internal Medicine

## 2020-01-01 ENCOUNTER — Other Ambulatory Visit: Payer: Self-pay | Admitting: Internal Medicine

## 2020-01-01 DIAGNOSIS — R112 Nausea with vomiting, unspecified: Secondary | ICD-10-CM | POA: Diagnosis present

## 2020-01-01 DIAGNOSIS — R197 Diarrhea, unspecified: Secondary | ICD-10-CM | POA: Insufficient documentation

## 2020-01-01 DIAGNOSIS — R109 Unspecified abdominal pain: Secondary | ICD-10-CM | POA: Diagnosis present

## 2020-01-01 DIAGNOSIS — R1084 Generalized abdominal pain: Secondary | ICD-10-CM

## 2020-01-13 ENCOUNTER — Ambulatory Visit: Payer: 59 | Admitting: Internal Medicine

## 2020-01-13 ENCOUNTER — Other Ambulatory Visit: Payer: Self-pay

## 2020-01-13 ENCOUNTER — Encounter: Payer: Self-pay | Admitting: Internal Medicine

## 2020-01-13 DIAGNOSIS — J9611 Chronic respiratory failure with hypoxia: Secondary | ICD-10-CM | POA: Diagnosis not present

## 2020-01-13 DIAGNOSIS — J449 Chronic obstructive pulmonary disease, unspecified: Secondary | ICD-10-CM | POA: Diagnosis not present

## 2020-01-13 MED ORDER — BUDESONIDE-FORMOTEROL FUMARATE 80-4.5 MCG/ACT IN AERO: INHALATION_SPRAY | RESPIRATORY_TRACT | 12 refills | Status: DC

## 2020-01-13 MED ORDER — STIOLTO RESPIMAT 2.5-2.5 MCG/ACT IN AERS: 2.0000 | INHALATION_SPRAY | Freq: Every day | RESPIRATORY_TRACT | 0 refills | Status: DC

## 2020-01-13 NOTE — Progress Notes (Signed)
@Patient  ID: , female    DOB: Feb 19, 1966     MRN: [id number]    Referring provider: [id number], MD  Brief patient profile:  49 yowf  Quit smoking 2009  previously  followed for COPD GOLD II (but really more of an AB /eosinophilic phenotype with pfts nearly nl p saba), dx of chronic hypoxic respiratory failure on oxygen at 4 L and chronic rhinitis Medical history significant for GERD Tension Pneumothorax 12/2001 s/p VATS w/ mechanical pleurodesis on right -Dr. 01/2002    TEST/EVENTS :  IgE 05/27/2018   3590 with Eosinophil count 800/mcL > 05/29/2018   07/29/2019 Follow-up : COPD, oxygen dependent respiratory failure, asthma, chronic rhinitis Patient returns for a 36-month follow-up.  Patient has underlying moderate COPD with allergic asthma (eosinophilic phenotype).  Last visit she continued to have increased symptom burden.  She was continued on DuoNeb 4 times daily.  Budesonide twice daily.  Last visit she was started on prednisone 10 mg daily .  She says she does feel that this has helped with her breathing.  She still gets very winded with minimal activity.  Patient had PFTs show stable lung function since 2019.  Patient has moderate airflow obstruction and restriction with FEV1 at 62%, ratio 66, FVC 74%, significant bronchodilator response with positive reversibility, 27% change, significant mid flow reversibility (137% change), DLCO improved at 65%. Patient remains on oxygen at 4 L.  Previous chest x-ray November 2020 showed clear lungs.  He denies any increased cough congestion or wheezing.  Patient is on disability which was approved and March 2021 unfortunately she does not have insurance.  She can afford her current nebulizer regimen. Is her medications through the good Rx rec Continue on Duoneb Four times a day   Continue on Budesonide Neb Twice daily   Continue on Prednisone 10mg  daily  Low sweet and salt diet  Activity as tolerated.  Continue on Oxygen on  4l/m    11/17/2019  Consultaton as 2nd opinion/[place] office/Tahmid Stonehocker re: AB/ 02 dep resp failure  Chief Complaint  Patient presents with  . Consult    Shortness of breath with exertion,   Dyspnea:  Doesn't do grocery shopping x sev years  [place] time walked was park [place] "before got  Hot" on 4lpm and sats 93% she reports while walking  Cough: esp in am / gray  Sleeping: ativan at hs / on side bed is flat/ 2 pillows / 3 x weekly wakes up feeling need for more saba SABA use: "all the time"  02: 4lpm 24/7 though POC at 4 pulsed outside home  Prednisone since jan 2021 but out x sev weeks and much worse even on bud neb rec Make sure you check your oxygen saturations at highest level of activity to be sure it stays over 90% and adjust upward to maintain this level if needed but remember to turn it back to previous settings when you stop (to conserve your supply).  Stop budesonide and duoneb  Plan A = Automatic = Always=    Stiolto 2 puffs first each am  Prednisone 10 mg 2 until better then 1 daily  Singulair 10 mg each pm  Plan B = Backup (to supplement plan A, not to replace it) Only use your albuterol inhaler as a rescue medication  Plan C = Crisis (instead of Plan B but only if Plan B stops working) - only use your albuterol nebulizer if you first try Plan B and it  fails to help > ok to use the nebulizer up to every 4 hours but if start needing it regularly call for immediate appointment   01/13/2020  f/u ov/[place] office/Derward Marple re:  Copd GOLD II / stiolto and pred 10 mg daily  Chief Complaint  Patient presents with  . Follow-up    shortness of breath with activity  Dyspnea: limited by R > L hip not sob Cough: minimal > mucoid Sleeping: bed is flat several pillows  SABA use: less ventolin but less active / rarely neb  02: 4lpm 24/7  Likes stiolto but can't afford on her insurance and has also tried [employer] park [employer] trelegy and breztri due to either cost or upper airway side  effects   No obvious day to day or daytime variability or assoc excess/ purulent sputum or mucus plugs or hemoptysis or cp or chest tightness, subjective wheeze or overt sinus or hb symptoms.   Sleeping as above  without nocturnal  or early am exacerbation  of respiratory  c/o's or need for noct saba. Also denies any obvious fluctuation of symptoms with weather or environmental changes or other aggravating or alleviating factors except as outlined above   No unusual exposure hx or h/o childhood pna/ asthma or knowledge of premature birth.  Current Allergies, Complete Past Medical History, Past Surgical History, Family History, and Social History were reviewed in [employer] record.  ROS  The following are not active complaints unless bolded Hoarseness, sore throat, dysphagia, dental problems, itching, sneezing,  nasal congestion or discharge of excess mucus or purulent secretions, ear ache,   fever, chills, sweats, unintended wt loss or wt gain, classically pleuritic or exertional cp,  orthopnea pnd or arm/hand swelling  or leg swelling, presyncope, palpitations, abdominal pain, anorexia, nausea, vomiting, diarrhea  or change in bowel habits or change in bladder habits, change in stools or change in urine, dysuria, hematuria,  rash, arthralgias, visual complaints, headache, numbness, weakness or ataxia or problems with walking or coordination,  change in mood or  memory.        Current Meds  Medication Sig  . acetaminophen (TYLENOL) 500 MG tablet Take 1,000 mg by mouth every 6 (six) hours as needed for moderate pain.  Marland Kitchen albuterol (PROVENTIL) (2.5 MG/3ML) 0.083% nebulizer solution Take 3 mLs (2.5 mg total) by nebulization every 4 (four) hours as needed for wheezing or shortness of breath.  Marland Kitchen albuterol (VENTOLIN HFA) 108 (90 BASE) MCG/ACT inhaler Inhale 2 puffs into the lungs every 4 (four) hours as needed for shortness of breath.   Marland Kitchen aspirin-acetaminophen-caffeine (EXCEDRIN  MIGRAINE) 250-250-65 MG tablet Take 2 tablets by mouth daily as needed for headache or migraine.   . bismuth subsalicylate (PEPTO BISMOL) 262 MG chewable tablet Chew 524 mg by mouth as needed for indigestion.  . cetirizine (ZYRTEC) 10 MG tablet Take 10 mg by mouth daily.  . citalopram (CELEXA) 40 MG tablet Take 40 mg by mouth daily.   Marland Kitchen docusate sodium (COLACE) 100 MG capsule Take 100 mg by mouth daily as needed for mild constipation.   . EUTHYROX 100 MCG tablet Take 100 mcg by mouth daily.  Marland Kitchen LORazepam (ATIVAN) 1 MG tablet Take 1 mg by mouth 2 (two) times daily as needed.  . montelukast (SINGULAIR) 10 MG tablet Take 1 tablet (10 mg total) by mouth at bedtime.  . pantoprazole (PROTONIX) 40 MG tablet Take 30- 60 min before your first and last meals of the day  . pravastatin (PRAVACHOL) 40 MG tablet  Take 40 mg by mouth daily.  . predniSONE (DELTASONE) 10 MG tablet 2 daily until better then one daily  . SUMAtriptan 6 MG/0.5ML SOAJ Inject 6 mg as directed daily as needed (migraine).   . Tiotropium Bromide-Olodaterol (STIOLTO RESPIMAT) 2.5-2.5 MCG/ACT AERS Inhale 2 puffs into the lungs daily.  . valACYclovir (VALTREX) 500 MG tablet Take 2,000 mg by mouth See admin instructions. Take 2000mg s at first sign of fever blister outbreak then take 500mg s daily until gone                     Past Medical History:  Diagnosis Date  . Anxiety   . Asthma   . Cervical radiculopathy   . Cluster headaches   . COPD (chronic obstructive pulmonary disease) (HCC)   . COPD (chronic obstructive pulmonary disease) with chronic bronchitis (HCC)   . GERD (gastroesophageal reflux disease)   . Hyperlipemia   . Hypoglycemia   . Hypothyroidism   . Migraine   . Pneumothorax   . Rectal discharge 07/28/2010  . Shortness of breath   . Sleep apnea    cannot tolerate-not using CPAP  . Sluggishness 07/28/2010        Physical Exam  01/13/2020        145   11/17/19 146 lb 12.8 oz (66.6 kg)  09/15/19 149 lb  6.4 oz (67.8 kg)  07/29/19 151 lb (68.5 kg)      Vital signs reviewed  01/13/2020  - Note at rest 02 sats  99% on 4lpm poc  Somber amb wf nad   HEENT : pt wearing mask not removed for exam due to covid - 19 concerns.   NECK :  without JVD/Nodes/TM/ nl carotid upstrokes bilaterally   LUNGS: no acc muscle use,  Min barrel  contour chest wall with bilateral  slightly decreased bs s audible wheeze and  without cough on insp or exp maneuvers and min  Hyperresonant  to  percussion bilaterally     CV:  RRR  no s3 or murmur or increase in P2, and no edema   ABD:  soft and nontender with pos end  insp Hoover's  in the supine position. No bruits or organomegaly appreciated, bowel sounds nl  MS:   Nl gait/  ext warm without deformities, calf tenderness, cyanosis or clubbing No obvious joint restrictions   SKIN: warm and dry without lesions    NEURO:  alert, approp, nl sensorium with  no motor or cerebellar deficits apparent.                   Assessment & Plan:

## 2020-01-13 NOTE — Patient Instructions (Addendum)
When you get medicare, strongly advise you to get medicare with commercial supplement   Plan A = Automatic = Always=   General  symbicort 80 Take 2 puffs first thing in am and then another 2 puffs about 12 hours later.  (or stiolto 2 puffs each am but not both)   Work on inhaler technique:  relax and gently blow all the way out then take a nice smooth deep breath back in, triggering the inhaler at same time you start breathing in.  Hold for up to 5 seconds if you can. Blow out thru nose. Rinse and gargle with water when done  Ok to go back to [employer] and pulmicort and duoneb like you were before as your maintenance    Plan B = Backup (to supplement plan A, not to replace it) Only use your albuterol inhaler as a rescue medication to be used if you can't catch your breath by resting or doing a relaxed purse lip breathing pattern.  - The less you use it, the better it will work when you need it. - Ok to use the inhaler up to 2 puffs  every 4 hours if you must but call for appointment if use goes up over your usual need - Don't leave home without it !!  (think of it like the spare tire for your car)   Plan C = Crisis (instead of Plan B but only if Plan B stops working) - only use your albuterol nebulizer if you first try Plan B and it fails to help > ok to use the nebulizer up to every 4 hours but if start needing it regularly call for immediate appointment   Plan D = Deltasone If doing worse despite ABC then try taking prednisone 2 until better then hold at 10 mg daily for a week then try 5mg        Please schedule a follow up visit in 3 months but call sooner if needed

## 2020-01-14 ENCOUNTER — Other Ambulatory Visit: Payer: Self-pay

## 2020-01-14 ENCOUNTER — Encounter: Payer: Self-pay | Admitting: Orthopedic Surgery

## 2020-01-14 ENCOUNTER — Ambulatory Visit: Payer: 59

## 2020-01-14 ENCOUNTER — Encounter: Payer: Self-pay | Admitting: Internal Medicine

## 2020-01-14 ENCOUNTER — Ambulatory Visit: Payer: 59 | Admitting: Orthopedic Surgery

## 2020-01-14 VITALS — BP 110/72 | HR 68 | Ht 64.0 in | Wt 145.0 lb

## 2020-01-14 DIAGNOSIS — G8929 Other chronic pain: Secondary | ICD-10-CM

## 2020-01-14 DIAGNOSIS — M5441 Lumbago with sciatica, right side: Secondary | ICD-10-CM

## 2020-01-14 DIAGNOSIS — M25551 Pain in right hip: Secondary | ICD-10-CM | POA: Diagnosis not present

## 2020-01-14 DIAGNOSIS — M879 Osteonecrosis, unspecified: Secondary | ICD-10-CM | POA: Diagnosis not present

## 2020-01-14 DIAGNOSIS — M48062 Spinal stenosis, lumbar region with neurogenic claudication: Secondary | ICD-10-CM

## 2020-01-14 MED ORDER — METAXALONE 400 MG PO TABS: 400.0000 mg | ORAL_TABLET | Freq: Three times a day (TID) | ORAL | 5 refills | Status: AC

## 2020-01-14 NOTE — Assessment & Plan Note (Signed)
Quit smoking 2009  - IgE 05/27/2018   3590 with Eosinophil count 800/mcL > [place]  - PFT's 07/29/2019  FEV1 2.25 (79 % ) ratio 0.70  p 27 % improvement from saba p 0 prior to study with DLCO  14.30 (65%) corrects to 3.87 (91%)  for alv volume and FV curve min curvature   - 11/17/2019  After extensive coaching inhaler device,  effectiveness =    90% with smi so try stiolto x 6 weeks and pred 20  Until better then 10 mg daily plus maint singulair   Overall improved on this combo but would ideally like her on more physiologic doses of pred and is more AB than copd so rec   Try symb 80 2bid and wean floor of pred to 5 mg daily with ceiling of 20 mg  - The proper method of use, as well as anticipated side effects, of a metered-dose inhaler were discussed and demonstrated to the patient using teach back method. Improved effectiveness after extensive coaching during this visit to a level of approximately 75 % from a baseline of 50 % > ok for hfa transition.  If all else fails can go back on pulmicort and duoneb until works out new insurance p first of year.  If gets medicare and [employer] could do performist/pulmicort and no LAMA needed    >>> f/u q 3 m, sooner prn           Each maintenance medication was reviewed in detail including emphasizing most importantly the difference between maintenance and prns and under what circumstances the prns are to be triggered using an action plan format where appropriate.  Total time for H and P, chart review, counseling, teaching device and generating customized AVS unique to this office visit / charting = 30 mn

## 2020-01-14 NOTE — Addendum Note (Signed)
Addended byCaffie Damme on: 01/14/2020 11:13 AM   Modules accepted: Orders

## 2020-01-14 NOTE — Progress Notes (Signed)
[name]  01/14/2020  Body mass index is 24.89 kg/m.  MEDICAL DECISION SECTION:  Encounter Diagnoses  Name Primary?  . Pain in right hip   . Chronic right-sided low back pain with right-sided sciatica   . Spinal stenosis of lumbar region with neurogenic claudication   . Osteonecrosis of right hip (HCC) Yes    Imaging Independent x-ray reading CT abd and pelvis: 4. Findings suggesting a degree of avascular necrosis in the right femoral head without collapse in this area.    Office x-rays hip and pelvis look normal  Lumbar spine shows scoliosis, facet arthrosis,  Plan:  MRI hip and pelvis AVN  Therapy 1 visit home program  Start Robaxin every 6 hours for back pain  (Rx., Inj., surg., Frx, MRI/CT, XR:2)  HISTORY SECTION :  Chief Complaint  Patient presents with  . Hip Pain    Right hip pain   HPI 54 year old female on chronic oxygen therapy previous history of steroid use was sent to [place] from Dr. Scharlene Gloss office for CT scan of the pelvis which showed possible avascular necrosis right hip  Patient complains of pain right lateral pelvis and chronic pain in her lower back which she always thought was sciatica  No prior treatment  Denies any leg pain but does complain of weakness getting up from the floor and also pain after several minutes of walking with weakness at that time   ROS   has a past medical history of Anxiety, Asthma, Cervical radiculopathy, Cluster headaches, COPD (chronic obstructive pulmonary disease) (HCC), COPD (chronic obstructive pulmonary disease) with chronic bronchitis (HCC), GERD (gastroesophageal reflux disease), Hyperlipemia, Hypoglycemia, Hypothyroidism, Migraine, Pneumothorax, Rectal discharge (07/28/2010), Shortness of breath, Sleep apnea, and Sluggishness (07/28/2010).   Past Surgical History:  Procedure Laterality Date  . ABDOMINAL HYSTERECTOMY     with bladder suspension  . CERVICAL FUSION    . CHOLECYSTECTOMY    .  COLONOSCOPY N/A 07/30/2013   Procedure: COLONOSCOPY;  Surgeon: Malissa Hippo, MD;  Location: [facility];  Service: Endoscopy;  Laterality: N/A;  930  . EXAM UNDER ANESTHESIA WITH MANIPULATION OF SHOULDER Right 10/11/2016   Procedure: EXAM UNDER ANESTHESIA WITH MANIPULATION OF SHOULDER;  Surgeon: Vickki Hearing, MD;  Location: [facility];  Service: Orthopedics;  Laterality: Right;  . INCISIONAL HERNIA REPAIR N/A 07/30/2016   Procedure: HERNIA REPAIR INCISIONAL WITH MESH;  Surgeon: [name], MD;  Location: [facility];  Service: General;  Laterality: N/A;  . LOBECTOMY     right lung   . POSTERIOR CERVICAL LAMINECTOMY Right 04/05/2017   Procedure: Right C6-7 Posterior cervical laminectomy;  Surgeon: Donalee Citrin, MD;  Location[facility];  Service: Neurosurgery;  Laterality: Right;  Right C6-7 Posterior cervical laminectomy  . SHOULDER ARTHROSCOPY WITH ROTATOR CUFF REPAIR Right 10/11/2016   Procedure: SHOULDER ARTHROSCOPY;  Surgeon: Vickki Hearing, MD;  Location: [facility];  Service: Orthopedics;  Laterality: Right;  . TUBAL LIGATION      Social History   Tobacco Use  . Smoking status: Former Smoker    Packs/day: 1.00    Years: 30.00    Pack years: 30.00    Types: Cigarettes    Quit date: 02/21/2008    Years since quitting: 11.9  . Smokeless tobacco: Never Used  Vaping Use  . Vaping Use: Never used  Substance Use Topics  . Alcohol use: No    Alcohol/week: 0.0 standard drinks    Comment: no  . Drug use: No    Family  History  Problem Relation Age of Onset  . COPD Mother   . Diabetes Mother   . Hyperlipidemia Mother   . Hypertension Mother   . Bipolar disorder Son   . Heart failure Maternal Grandmother   . Hypertension Maternal Grandmother   . Thyroid disease Maternal Grandmother   . Diabetes Paternal Grandmother   . Alzheimer's disease Paternal Grandmother   . Heart failure Paternal Grandfather   . Hypertension Paternal Grandfather   . Hypertension Father   . Colon cancer Neg  Hx      Allergies  Allergen Reactions  . Aspirin Shortness Of Breath    Causes asthma flares   . Penicillins Other (See Comments)    UNSPECIFIED REACTION OF CHILDHOOD  Has patient had a PCN reaction causing immediate rash, facial/tongue/throat swelling, SOB or lightheadedness with hypotension: NO Has patient had a PCN reaction causing severe rash involving mucus membranes or skin necrosis: NO Has patient had a PCN reaction that required hospitalization: no Has patient had a PCN reaction occurring within the last 10 years: NO If all of the above answers are "NO", then may proceed with Cephalosporin use.   . Eggs Or Egg-Derived Products Diarrhea and Other (See Comments)    BLOATING > GAS  . Vicodin [Hydrocodone-Acetaminophen] Other (See Comments)    Headaches      Current Outpatient Medications:  .  acetaminophen (TYLENOL) 500 MG tablet, Take 1,000 mg by mouth every 6 (six) hours as needed for moderate pain., Disp: , Rfl:  .  albuterol (PROVENTIL) (2.5 MG/3ML) 0.083% nebulizer solution, Take 3 mLs (2.5 mg total) by nebulization every 4 (four) hours as needed for wheezing or shortness of breath., Disp: 75 mL, Rfl: 12 .  albuterol (VENTOLIN HFA) 108 (90 BASE) MCG/ACT inhaler, Inhale 2 puffs into the lungs every 4 (four) hours as needed for shortness of breath. , Disp: , Rfl:  .  aspirin-acetaminophen-caffeine (EXCEDRIN MIGRAINE) 250-250-65 MG tablet, Take 2 tablets by mouth daily as needed for headache or migraine. , Disp: , Rfl:  .  bismuth subsalicylate (PEPTO BISMOL) 262 MG chewable tablet, Chew 524 mg by mouth as needed for indigestion., Disp: , Rfl:  .  budesonide-formoterol (SYMBICORT) 80-4.5 MCG/ACT inhaler, Take 2 puffs first thing in am and then another 2 puffs about 12 hours later., Disp: 1 each, Rfl: 12 .  cetirizine (ZYRTEC) 10 MG tablet, Take 10 mg by mouth daily., Disp: , Rfl:  .  citalopram (CELEXA) 40 MG tablet, Take 40 mg by mouth daily. , Disp: , Rfl:  .  docusate  sodium (COLACE) 100 MG capsule, Take 100 mg by mouth daily as needed for mild constipation. , Disp: , Rfl:  .  EUTHYROX 100 MCG tablet, Take 100 mcg by mouth daily., Disp: , Rfl:  .  LORazepam (ATIVAN) 1 MG tablet, Take 1 mg by mouth 2 (two) times daily as needed., Disp: , Rfl:  .  montelukast (SINGULAIR) 10 MG tablet, Take 1 tablet (10 mg total) by mouth at bedtime., Disp: 30 tablet, Rfl: 11 .  pantoprazole (PROTONIX) 40 MG tablet, Take 30- 60 min before your first and last meals of the day, Disp: , Rfl:  .  pravastatin (PRAVACHOL) 40 MG tablet, Take 40 mg by mouth daily., Disp: , Rfl:  .  predniSONE (DELTASONE) 10 MG tablet, 2 daily until better then one daily, Disp: 100 tablet, Rfl: 0 .  SUMAtriptan 6 MG/0.5ML SOAJ, Inject 6 mg as directed daily as needed (migraine). , Disp: , Rfl:  .  Tiotropium Bromide-Olodaterol (STIOLTO RESPIMAT) 2.5-2.5 MCG/ACT AERS, Inhale 2 puffs into the lungs daily., Disp: 4 g, Rfl: 0 .  Tiotropium Bromide-Olodaterol (STIOLTO RESPIMAT) 2.5-2.5 MCG/ACT AERS, Inhale 2 puffs into the lungs daily., Disp: 4 g, Rfl: 0 .  valACYclovir (VALTREX) 500 MG tablet, Take 2,000 mg by mouth See admin instructions. Take 2000mg s at first sign of fever blister outbreak then take 500mg s daily until gone, Disp: , Rfl:  .  metaxalone (SKELAXIN) 400 MG tablet, Take 1 tablet (400 mg total) by mouth 3 (three) times daily., Disp: 90 tablet, Rfl: 5   PHYSICAL EXAM SECTION: BP 110/72   Pulse 68   Ht 5\' 4"  (1.626 m)   Wt 145 lb (65.8 kg)   BMI 24.89 kg/m   Body mass index is 24.89 kg/m.   General appearance: Well-developed well-nourished no gross deformities  oxygen nasal cannula present   Eyes clear normal vision no evidence of conjunctivitis or jaundice, extraocular muscles intact  ENT: ears hearing normal, nasal passages clear, throat clear   Lymph nodes: No lymphadenopathy  Neck is supple without palpable mass, full range of motion  Cardiovascular normal pulse and perfusion in  all 4 extremities normal color without edema  Neurologically deep tendon reflexes are equal and normal, no sensation loss or deficits no pathologic reflexes  Psychological: Awake alert and oriented x3 mood and affect normal  Skin no lacerations or ulcerations no nodularity no palpable masses, no erythema or nodularity  Musculoskeletal:   Lumbar spine is tender across the midline left and right side and then this goes into the buttocks with tenderness there as well.  Lower extremity strength and stability normal  Normal hip exam including internal and external rotation in both hips  No reflex abnormalities as stated   11:09 AM

## 2020-01-14 NOTE — Assessment & Plan Note (Signed)
11/17/2019   Walked 4lpm her POC  approx   300 ft  @ slow/[place] gate  stopped due to  Sob and R hip and leg pain with sats still 98%     Again advised: Make sure you check your oxygen saturations at highest level of activity to be sure it stays over 90% and adjust  02 flow upward to maintain this level if needed but remember to turn it back to previous settings when you stop (to conserve your supply).

## 2020-01-14 NOTE — Patient Instructions (Addendum)
MRI hip and pelvis AVN  Therapy 1 visit home program  Meds ordered this encounter  Medications  . metaxalone (SKELAXIN) 400 MG tablet    Sig: Take 1 tablet (400 mg total) by mouth 3 (three) times daily.    Dispense:  90 tablet    Refill:  5

## 2020-01-18 ENCOUNTER — Other Ambulatory Visit: Payer: Self-pay | Admitting: Internal Medicine

## 2020-01-27 ENCOUNTER — Ambulatory Visit (HOSPITAL_COMMUNITY)
Admission: RE | Admit: 2020-01-27 | Discharge: 2020-01-27 | Disposition: A | Discharge status: Home / Self Care | Payer: 59 | Source: Ambulatory Visit | Attending: Orthopedic Surgery | Admitting: Orthopedic Surgery

## 2020-01-27 ENCOUNTER — Other Ambulatory Visit: Payer: Self-pay

## 2020-01-27 DIAGNOSIS — M879 Osteonecrosis, unspecified: Secondary | ICD-10-CM | POA: Insufficient documentation

## 2020-01-27 DIAGNOSIS — M25551 Pain in right hip: Secondary | ICD-10-CM

## 2020-02-01 ENCOUNTER — Other Ambulatory Visit: Payer: Self-pay

## 2020-02-01 ENCOUNTER — Encounter (HOSPITAL_COMMUNITY): Payer: Self-pay | Admitting: Physical Therapy

## 2020-02-01 ENCOUNTER — Ambulatory Visit (HOSPITAL_COMMUNITY): Payer: 59 | Attending: Orthopedic Surgery | Admitting: Physical Therapy

## 2020-02-01 DIAGNOSIS — R262 Difficulty in walking, not elsewhere classified: Secondary | ICD-10-CM | POA: Diagnosis present

## 2020-02-01 DIAGNOSIS — M25551 Pain in right hip: Secondary | ICD-10-CM | POA: Insufficient documentation

## 2020-02-01 NOTE — Patient Instructions (Signed)
Access Code: [email or web address] URL: [email or web address] Date: 02/01/2020 Prepared by: Revonda Humphrey  Exercises Seated Hip Adduction Isometrics with Newman Pies - 1 x daily - 7 x weekly - 2 sets - 10 reps - 5 hold Seated Long Arc Quad - 1 x daily - 7 x weekly - 4 sets - 5 reps - 5 hold Sit to Stand without Arm Support - 1 x daily - 7 x weekly - 4 sets - 5 reps Bent Knee Fallouts - 1 x daily - 7 x weekly - 2 sets - 10 reps

## 2020-02-01 NOTE — Therapy (Signed)
[facility] [address] [place], [place], [address] Phone: [phone]   Fax:  [phone]  Physical Therapy Evaluation  Patient Details  Name: [name] MRN: [id number] Date of Birth: 1966/01/23 Referring Provider (PT): Fuller Canada   Encounter Date: 02/01/2020   PT End of Session - 02/01/20 1034    Visit Number 1    Number of Visits 1    Date for PT Re-Evaluation 02/08/20    Authorization Type [employer], 30 VL combined PT/OT 0 used, no auth req    Authorization - Number of Visits 1    Progress Note Due on Visit 30    PT Start Time 1035    PT Stop Time 1115    PT Time Calculation (min) 40 min    Activity Tolerance Patient tolerated treatment well           Past Medical History:  Diagnosis Date  . Anxiety   . Asthma   . Cervical radiculopathy   . Cluster headaches   . COPD (chronic obstructive pulmonary disease) (HCC)   . COPD (chronic obstructive pulmonary disease) with chronic bronchitis (HCC)   . GERD (gastroesophageal reflux disease)   . Hyperlipemia   . Hypoglycemia   . Hypothyroidism   . Migraine   . Pneumothorax   . Rectal discharge 07/28/2010  . Shortness of breath   . Sleep apnea    cannot tolerate-not using CPAP  . Sluggishness 07/28/2010    Past Surgical History:  Procedure Laterality Date  . ABDOMINAL HYSTERECTOMY     with bladder suspension  . CERVICAL FUSION    . CHOLECYSTECTOMY    . COLONOSCOPY N/A 07/30/2013   Procedure: COLONOSCOPY;  Surgeon: Malissa Hippo, MD;  [facility];  Service: Endoscopy;  Laterality: N/A;  930  . EXAM UNDER ANESTHESIA WITH MANIPULATION OF SHOULDER Right 10/11/2016   Procedure: EXAM UNDER ANESTHESIA WITH MANIPULATION OF SHOULDER;  Surgeon: Vickki Hearing, MD;  Location: [facility];  Service: Orthopedics;  Laterality: Right;  . INCISIONAL HERNIA REPAIR N/A 07/30/2016   Procedure: HERNIA REPAIR INCISIONAL WITH MESH;  Surgeon: [name], MD;   Location: [facility];  Service: General;  Laterality: N/A;  . LOBECTOMY     right lung   . POSTERIOR CERVICAL LAMINECTOMY Right 04/05/2017   Procedure: Right C6-7 Posterior cervical laminectomy;  Surgeon: Donalee Citrin, MD;  Location: [facility] OR;  Service: Neurosurgery;  Laterality: Right;  Right C6-7 Posterior cervical laminectomy  . SHOULDER ARTHROSCOPY WITH ROTATOR CUFF REPAIR Right 10/11/2016   Procedure: SHOULDER ARTHROSCOPY;  Surgeon: Vickki Hearing, MD;  Location: [facility];  Service: Orthopedics;  Laterality: Right;  . TUBAL LIGATION      There were no vitals filed for this visit.    Subjective Assessment - 02/01/20 1043    Subjective States that she has bilateral hip necrosis and tearing in labrums. States she has been having a lot of pain in her right hip for years. States that in 2010 she was told she had sciatica and just thought it was that. States she had a CT scan of the abdomen and they found the necrosis. States she doesn't go back to MD until 02/04/20. States that finances are challenging and she wants to get some home exercises for her.    Pertinent History on 4L O2, COPD, Asthma, osteopenia,    Limitations Standing;Walking    Patient Stated Goals to be able to walk better    Currently in Pain?  Yes    Pain Score 7     Pain Location Hip    Pain Orientation Right    Pain Descriptors / Indicators Aching;Dull    Pain Type Chronic pain    Pain Radiating Towards into groin and buttocks    Pain Onset More than a month ago    Pain Frequency Constant    Aggravating Factors  with walking and weightbearing    Pain Relieving Factors walking and weightbearing              [facility] PT Assessment - 02/01/20 0001      Assessment   Medical Diagnosis R avascular necrosis hip    Referring Provider (PT) Fuller Canada    Prior Therapy yes       Balance Screen   Has the patient fallen in the past 6 months Yes    How many times? 2    Has the patient had a decrease in activity level  because of a fear of falling?  Yes    Is the patient reluctant to leave their home because of a fear of falling?  Yes      Home [employer] information [employer] manager residence      Prior Function   Level of Independence Independent      Cognition   Overall Cognitive Status Within Functional Limits for tasks assessed      Observation/Other Assessments   Focus on Therapeutic Outcomes (FOTO)  NA      ROM / Strength   AROM / PROM / Strength AROM;Strength      AROM   AROM Assessment Site Hip    Right/Left Hip Right;Left    Right Hip Flexion 92   pain in ipsilateral hip   Right Hip External Rotation  5   pain in ipsilateral hip   Right Hip Internal Rotation  8   pain in ipsilateral hip   Left Hip Flexion 105   pain in ipsilateral hip   Left Hip External Rotation  45   pain in ipsilateral hip   Left Hip Internal Rotation  30   pain in ipsilateral hip     Strength   Strength Assessment Site Knee;Hip    Right/Left Hip Left;Right    Right Hip Flexion 3+/5   pain in ipsilateral hip   Left Hip Flexion 4-/5   pain in ipsilateral hip   Right/Left Knee Right;Left    Right Knee Flexion 3/5   pain in ipsilateral hip   Right Knee Extension 3+/5   pain in ipsilateral hip   Left Knee Flexion 3+/5    Left Knee Extension 4-/5                      Objective measurements completed on examination: See above findings.       [facility] Adult PT Treatment/Exercise - 02/01/20 0001      Exercises   Exercises Knee/Hip      Knee/Hip Exercises: Seated   Long Arc Quad AROM;Both;2 sets;5 reps   5" holds   Ball Squeeze 2x10 5" holds    Sit to [employer] 10 reps;without UE support   with pillow for elevated surface                 PT Education - 02/01/20 1109    Education Details answered all questions about anatomy, HEP and rehab    Person(s) Educated Patient    Methods Explanation    Comprehension Verbalized understanding  PT Short Term Goals - 02/01/20 1225       PT SHORT TERM GOAL #1   Title Patient will demonstrate good form with HEP exercises.    Time 2    Period Weeks    Status New    Target Date 02/15/20                     Plan - 02/01/20 1226    Clinical Impression Statement Patient presents to therapy with bilateral avascular necrosis of hip and bilateral hip pain right more than left. Patient presents with weakness, ROM and pain deficits that are greatly limiting patient's ability to function. Patient with goal to work on HEP at home. Answered all questions and established home program for patient. One time visit on this date for this POC. Patient to follow up with MD later this week.    Personal Factors and Comorbidities Comorbidity 1;Comorbidity 2;Comorbidity 3+    Comorbidities bilateral avascular necrosis, COPD, osteopenia    Examination-Activity Limitations Squat;Stairs;Stand;Transfers;Locomotion Level;Lift    Examination-Participation Restrictions Cleaning;Meal Prep;Shop    Stability/Clinical Decision Making Stable/Uncomplicated    Clinical Decision Making Low    Rehab Potential Fair    PT Frequency One time visit    PT Treatment/Interventions ADLs/Self Care Home Management;Therapeutic exercise    PT Next Visit Plan one time visit    PT Home Exercise Plan one time visit           Patient will benefit from skilled therapeutic intervention in order to improve the following deficits and impairments:  Pain, Decreased strength, Decreased activity tolerance, Difficulty walking, Decreased mobility, Decreased balance, Decreased range of motion, Decreased endurance  Visit Diagnosis: Difficulty in walking, not elsewhere classified  Pain in right hip     Problem List Patient Active Problem List   Diagnosis Date Noted  . Allergic rhinitis 05/21/2019  . Chronic asthmatic bronchitis (HCC) 04/30/2019  . Chronic respiratory failure with hypoxia (HCC) 04/30/2019  . Spinal stenosis of cervical region 04/05/2017  .  Partial tear of right subscapularis tendon   . Incisional hernia, without obstruction or gangrene   . Urinary frequency 03/21/2012  . Migraine equivalent syndrome 03/21/2012  . Insomnia 03/21/2012   1:10 PM, 02/01/20 Tereasa Coop, DPT Physical Therapy with [facility]  [phone] office  [facility] [address] [place], [place], [address] Phone: [phone]   Fax:  [phone]  Name: [name] MRN: [id number] Date of Birth: 09-20-1965

## 2020-02-04 ENCOUNTER — Ambulatory Visit (INDEPENDENT_AMBULATORY_CARE_PROVIDER_SITE_OTHER): Payer: 59 | Admitting: Orthopedic Surgery

## 2020-02-04 ENCOUNTER — Other Ambulatory Visit: Payer: Self-pay

## 2020-02-04 ENCOUNTER — Encounter: Payer: Self-pay | Admitting: Orthopedic Surgery

## 2020-02-04 VITALS — BP 140/84 | HR 92 | Ht 64.0 in | Wt 145.0 lb

## 2020-02-04 DIAGNOSIS — M879 Osteonecrosis, unspecified: Secondary | ICD-10-CM | POA: Diagnosis not present

## 2020-02-04 NOTE — Progress Notes (Signed)
Chief Complaint  Patient presents with  . Hip Pain    bilateral    54 year old female with bilateral hip pain right greater than left.  X-ray showed possible avascular necrosis.  She did have MRI which shows a subchondral fracture on the right with AVN and subtle AVN on the left hip  She is been on steroids since 1995 she has COPD she is on home O2  She did go to physical therapy she took maximum dose of Tylenol and did not get relief.  MRI has been evaluated reviewed including the report  The MRI definitely does show avascular necrosis with subchondral fracture on the right and the left AVN which is more subtle  Recommend the patient decrease her Tylenol use to no more than eight a day start ibuprofen and see Dr. Magnus Ivan for total hip replacement  Encounter Diagnosis  Name Primary?  . Osteonecrosis of right hip (HCC) Yes   (Chronic problem, read outside report, refer for surgery)

## 2020-02-10 ENCOUNTER — Ambulatory Visit (INDEPENDENT_AMBULATORY_CARE_PROVIDER_SITE_OTHER): Payer: 59 | Admitting: Orthopaedic Surgery

## 2020-02-10 ENCOUNTER — Encounter: Payer: Self-pay | Admitting: Orthopaedic Surgery

## 2020-02-10 VITALS — Ht 64.0 in | Wt 145.0 lb

## 2020-02-10 DIAGNOSIS — M87052 Idiopathic aseptic necrosis of left femur: Secondary | ICD-10-CM | POA: Insufficient documentation

## 2020-02-10 DIAGNOSIS — M87051 Idiopathic aseptic necrosis of right femur: Secondary | ICD-10-CM | POA: Insufficient documentation

## 2020-02-10 DIAGNOSIS — M25552 Pain in left hip: Secondary | ICD-10-CM

## 2020-02-10 DIAGNOSIS — M25551 Pain in right hip: Secondary | ICD-10-CM

## 2020-02-10 NOTE — Progress Notes (Signed)
Office Visit Note   Patient: [name]           Date of Birth: [birth year]           MRN: [id number] Visit Date: 02/10/2020              Requested by: Vickki Hearing, MD [address] [place],  [place] [address] PCP: Benita Stabile, MD   Assessment & Plan: Visit Diagnoses:  1. Pain in right hip   2. Pain in left hip   3. Avascular necrosis of bone of left hip (HCC)   4. Avascular necrosis of bone of right hip (HCC)     Plan: I have recommended hip replacement surgery to treat her avascular necrosis and showed her hip model and explained in detail what the surgery involves.  I gave her handout describing it as well.  We talked in detail about the interoperative and postoperative course and what to expect with surgery.  We talked about the risk and benefits of surgery as well.  She understands this is a complicated issue because she would need spinal anesthesia but also clearance from her pulmonologist and clearance from an anesthesia standpoint.  We will need to have her see her pulmonologist before surgery as well as obtain an anesthesia consult.  She understands this fully.  All questions and concerns were answered and addressed.  Follow-Up Instructions: Return for 2 weeks post-op.   Orders:  No orders of the defined types were placed in this encounter.  No orders of the defined types were placed in this encounter.     Procedures: No procedures performed   Clinical Data: No additional findings.   Subjective: Chief Complaint  Patient presents with  . Right Hip - Pain  The patient is a 54 year old female referred from Dr. Romeo Apple with orthopedics in [place] to evaluate and treat avascular necrosis involving the patient's hips.  It is more involved with the right hip and most of her pain is with the right hip.  She says is been getting worse over the last 1 to 2 years though worse over the last few months with increased pain that is activity related and  weightbearing related.  She is someone with severe COPD and is now on 4 L of oxygen.  She does state that she had surgery successfully in 2018 when she had bad COPD.  She has had cervical spine surgery and shoulder surgery without any issues under general anesthesia.  However, she was not on oxygen at the time.  She is a regular patient of Dr. Sherene Sires with pulmonology.  She does report severe and daily right hip pain.  Plain films and a MRI of her hips are on the canopy system for me to review.  HPI  Review of Systems There is currently no fever, chills, nausea, vomiting she has no chest pain but does have chronic shortness of breath.  Objective: Vital Signs: Ht 5\' 4"  (1.626 m)   Wt 145 lb (65.8 kg)   BMI 24.89 kg/m   Physical Exam She is alert and oriented x3 and in no acute distress.  She is on her oxygen currently.  She is a thin individual. Ortho Exam Examination of her right hip shows significant pain with internal and external rotation.  There is no blocks to rotation.  The pain is mainly in the groin.  There is pain to hip compression as well.  There is some mild left hip pain with rotation.  Specialty Comments:  No specialty comments available.  Imaging: No results found. The plain films of her hips show no evidence of femoral head collapse.  The MRI of each hip does show avascular necrosis involving both femoral heads but no collapse.  PMFS History: Patient Active Problem List   Diagnosis Date Noted  . Avascular necrosis of bone of left hip (HCC) 02/10/2020  . Avascular necrosis of bone of right hip (HCC) 02/10/2020  . Allergic rhinitis 05/21/2019  . Chronic asthmatic bronchitis (HCC) 04/30/2019  . Chronic respiratory failure with hypoxia (HCC) 04/30/2019  . Spinal stenosis of cervical region 04/05/2017  . Partial tear of right subscapularis tendon   . Incisional hernia, without obstruction or gangrene   . Urinary frequency 03/21/2012  . Migraine equivalent syndrome  03/21/2012  . Insomnia 03/21/2012   Past Medical History:  Diagnosis Date  . Anxiety   . Asthma   . Cervical radiculopathy   . Cluster headaches   . COPD (chronic obstructive pulmonary disease) (HCC)   . COPD (chronic obstructive pulmonary disease) with chronic bronchitis (HCC)   . GERD (gastroesophageal reflux disease)   . Hyperlipemia   . Hypoglycemia   . Hypothyroidism   . Migraine   . Pneumothorax   . Rectal discharge 07/28/2010  . Shortness of breath   . Sleep apnea    cannot tolerate-not using CPAP  . Sluggishness 07/28/2010    Family History  Problem Relation Age of Onset  . COPD Mother   . Diabetes Mother   . Hyperlipidemia Mother   . Hypertension Mother   . Bipolar disorder Son   . Heart failure Maternal Grandmother   . Hypertension Maternal Grandmother   . Thyroid disease Maternal Grandmother   . Diabetes Paternal Grandmother   . Alzheimer's disease Paternal Grandmother   . Heart failure Paternal Grandfather   . Hypertension Paternal Grandfather   . Hypertension Father   . Colon cancer Neg Hx     Past Surgical History:  Procedure Laterality Date  . ABDOMINAL HYSTERECTOMY     with bladder suspension  . CERVICAL FUSION    . CHOLECYSTECTOMY    . COLONOSCOPY N/A 07/30/2013   Procedure: COLONOSCOPY;  Surgeon: Malissa Hippo, MD;  [facility];  Service: Endoscopy;  Laterality: N/A;  930  . EXAM UNDER ANESTHESIA WITH MANIPULATION OF SHOULDER Right 10/11/2016   Procedure: EXAM UNDER ANESTHESIA WITH MANIPULATION OF SHOULDER;  Surgeon: Vickki Hearing, MD;  Location: [facility];  Service: Orthopedics;  Laterality: Right;  . INCISIONAL HERNIA REPAIR N/A 07/30/2016   Procedure: HERNIA REPAIR INCISIONAL WITH MESH;  Surgeon: [name], MD;  Location: [facility];  Service: General;  Laterality: N/A;  . LOBECTOMY     right lung   . POSTERIOR CERVICAL LAMINECTOMY Right 04/05/2017   Procedure: Right C6-7 Posterior cervical laminectomy;  Surgeon: Donalee Citrin, MD;   [facility] OR;  Service: Neurosurgery;  Laterality: Right;  Right C6-7 Posterior cervical laminectomy  . SHOULDER ARTHROSCOPY WITH ROTATOR CUFF REPAIR Right 10/11/2016   Procedure: SHOULDER ARTHROSCOPY;  Surgeon: Vickki Hearing, MD;  Location: [facility];  Service: Orthopedics;  Laterality: Right;  . TUBAL LIGATION     Social History   Occupational History  . Not on file  Tobacco Use  . Smoking status: Former Smoker    Packs/day: 1.00    Years: 30.00    Pack years: 30.00    Types: Cigarettes    Quit date: 02/21/2008    Years since quitting:  11.9  . Smokeless tobacco: Never Used  Vaping Use  . Vaping Use: Never used  Substance and Sexual Activity  . Alcohol use: No    Alcohol/week: 0.0 standard drinks    Comment: no  . Drug use: No  . Sexual activity: Not Currently    Partners: Male    Birth control/protection: Surgical

## 2020-02-17 ENCOUNTER — Other Ambulatory Visit: Payer: Self-pay | Admitting: Orthopaedic Surgery

## 2020-02-17 ENCOUNTER — Encounter: Payer: Self-pay | Admitting: Orthopaedic Surgery

## 2020-02-17 MED ORDER — TRAMADOL HCL 50 MG PO TABS: 100.0000 mg | ORAL_TABLET | Freq: Four times a day (QID) | ORAL | 0 refills | Status: DC | PRN

## 2020-02-18 ENCOUNTER — Encounter: Payer: Self-pay | Admitting: Orthopaedic Surgery

## 2020-02-29 ENCOUNTER — Encounter: Payer: Self-pay | Admitting: Orthopaedic Surgery

## 2020-02-29 ENCOUNTER — Other Ambulatory Visit: Payer: Self-pay | Admitting: Orthopaedic Surgery

## 2020-02-29 MED ORDER — ACETAMINOPHEN-CODEINE #3 300-30 MG PO TABS: 1.0000 | ORAL_TABLET | Freq: Three times a day (TID) | ORAL | 0 refills | Status: DC | PRN

## 2020-03-07 ENCOUNTER — Other Ambulatory Visit: Payer: Self-pay

## 2020-03-07 ENCOUNTER — Ambulatory Visit: Payer: 59 | Admitting: Internal Medicine

## 2020-03-07 ENCOUNTER — Encounter: Payer: Self-pay | Admitting: Orthopaedic Surgery

## 2020-03-07 ENCOUNTER — Encounter: Payer: Self-pay | Admitting: Internal Medicine

## 2020-03-07 ENCOUNTER — Other Ambulatory Visit (HOSPITAL_COMMUNITY)
Admission: RE | Admit: 2020-03-07 | Discharge: 2020-03-07 | Disposition: A | Discharge status: Home / Self Care | Payer: 59 | Source: Ambulatory Visit | Attending: Internal Medicine | Admitting: Internal Medicine

## 2020-03-07 DIAGNOSIS — J449 Chronic obstructive pulmonary disease, unspecified: Secondary | ICD-10-CM | POA: Diagnosis present

## 2020-03-07 DIAGNOSIS — J9611 Chronic respiratory failure with hypoxia: Secondary | ICD-10-CM

## 2020-03-07 MED ORDER — STIOLTO RESPIMAT 2.5-2.5 MCG/ACT IN AERS: 2.0000 | INHALATION_SPRAY | Freq: Every day | RESPIRATORY_TRACT | 0 refills | Status: DC

## 2020-03-07 NOTE — Patient Instructions (Addendum)
symbicort or stiolto daily but not both  Work on inhaler technique:  relax and gently blow all the way out then take a nice smooth deep breath back in, triggering the inhaler at same time you start breathing in.  Hold for up to 5 seconds if you can.   Rinse and gargle with water when done  When breathing is bad take prednisone 20 mg daily and 10 mg daily otherwise - take all of it first thing in am    Please remember to go to the lab department @ [facility] for your tests - we will call you with the results when they are available.       Please schedule a follow up visit in 3 months but call sooner if needed

## 2020-03-07 NOTE — Assessment & Plan Note (Signed)
Quit smoking 2009  - IgE 05/27/2018   3590 with Eosinophil count 800/mcL > Gallagher  - PFT's 07/29/2019  FEV1 2.25 (79 % ) ratio 0.70  p 27 % improvement from saba p 0 prior to study with DLCO  14.30 (65%) corrects to 3.87 (91%)  for alv volume and FV curve min curvature   - 11/17/2019  After extensive coaching inhaler device,  effectiveness =    90% with smi so try stiolto x 6 weeks and pred 20  Until better then 10 mg daily plus maint singulair  - Labs ordered 03/07/2020   alpha one AT phenotype    She is AB and not copd after all so no problem with surgery but definitely needs to return to allergy asap as she has been on maint pred since 11/2019/ advised   In meantime: The goal with a chronic steroid dependent illness is always arriving at the lowest effective dose that controls the disease/symptoms and not accepting a set "formula" which is based on statistics or guidelines that don't always take into account patient  variability or the natural hx of the dz in every individual patient, which may well vary over time.  For now therefore I recommend the patient maintain  Ceiling of 20 mg and floor of 10 mg and even consider 5 mg if starts having good stretches with less dep on sama.  Also strongly favor symb 160 over stiolto but for now she can't afford the symbicort and using stiolto samples  - The proper method of use, as well as anticipated side effects, of a metered-dose inhaler were discussed and demonstrated to the patient using teach back method. Improved effectiveness after extensive coaching during this visit to a level of approximately 75 % from a baseline of 50 % (short ti)

## 2020-03-07 NOTE — Assessment & Plan Note (Addendum)
11/17/2019   Walked 4lpm her POC  approx   300 ft  @ slow/awkard gate  stopped due to  Sob and R hip and leg pain with sats still 98%     As of 03/07/2020  rec 4lpm 24/7 though ok to tritrate on portable 02 to sats >  90%   Approved for hip surgery with option for GA if needed p discussion of risk / benefit  Needs ortho and pulmonary rehab post op  Each maintenance medication was reviewed in detail including emphasizing most importantly the difference between maintenance and prns and under what circumstances the prns are to be triggered using an action plan format where appropriate.  Total time for H and P, chart review, counseling, teaching device and generating customized AVS unique to this office visit / charting > 30 min

## 2020-03-07 NOTE — Progress Notes (Signed)
@Patient  ID: , female    DOB: Feb 19, 1966     MRN: [id number]    Referring provider: [id number], MD  Brief patient profile:  49 yowf  Quit smoking 2009  previously  followed for COPD GOLD II (but really more of an AB /eosinophilic phenotype with pfts nearly nl p saba), dx of chronic hypoxic respiratory failure on oxygen at 4 L and chronic rhinitis Medical history significant for GERD Tension Pneumothorax 12/2001 s/p VATS w/ mechanical pleurodesis on right -Dr. 01/2002    TEST/EVENTS :  IgE 05/27/2018   3590 with Eosinophil count 800/mcL > 05/29/2018   07/29/2019 Follow-up : COPD, oxygen dependent respiratory failure, asthma, chronic rhinitis Patient returns for a 36-month follow-up.  Patient has underlying moderate COPD with allergic asthma (eosinophilic phenotype).  Last visit she continued to have increased symptom burden.  She was continued on DuoNeb 4 times daily.  Budesonide twice daily.  Last visit she was started on prednisone 10 mg daily .  She says she does feel that this has helped with her breathing.  She still gets very winded with minimal activity.  Patient had PFTs show stable lung function since 2019.  Patient has moderate airflow obstruction and restriction with FEV1 at 62%, ratio 66, FVC 74%, significant bronchodilator response with positive reversibility, 27% change, significant mid flow reversibility (137% change), DLCO improved at 65%. Patient remains on oxygen at 4 L.  Previous chest x-ray November 2020 showed clear lungs.  He denies any increased cough congestion or wheezing.  Patient is on disability which was approved and March 2021 unfortunately she does not have insurance.  She can afford her current nebulizer regimen. Is her medications through the good Rx rec Continue on Duoneb Four times a day   Continue on Budesonide Neb Twice daily   Continue on Prednisone 10mg  daily  Low sweet and salt diet  Activity as tolerated.  Continue on Oxygen on  4l/m    11/17/2019  Consultaton as 2nd opinion/[place] office/Charod Slawinski re: AB/ 02 dep resp failure  Chief Complaint  Patient presents with  . Consult    Shortness of breath with exertion,   Dyspnea:  Doesn't do grocery shopping x sev years  [place] time walked was [place] "before got  Hot" on 4lpm and sats 93% she reports while walking  Cough: esp in am / gray  Sleeping: ativan at hs / on side bed is flat/ 2 pillows / 3 x weekly wakes up feeling need for more saba SABA use: "all the time"  02: 4lpm 24/7 though POC at 4 pulsed outside home  Prednisone since jan 2021 but out x sev weeks and much worse even on bud neb rec Make sure you check your oxygen saturations at highest level of activity to be sure it stays over 90% and adjust upward to maintain this level if needed but remember to turn it back to previous settings when you stop (to conserve your supply).  Stop budesonide and duoneb  Plan A = Automatic = Always=    Stiolto 2 puffs first each am  Prednisone 10 mg 2 until better then 1 daily  Singulair 10 mg each pm  Plan B = Backup (to supplement plan A, not to replace it) Only use your albuterol inhaler as a rescue medication  Plan C = Crisis (instead of Plan B but only if Plan B stops working) - only use your albuterol nebulizer if you first try Plan B and it  fails to help > ok to use the nebulizer up to every 4 hours but if start needing it regularly call for immediate appointment   01/13/2020  f/u ov/[place] office/Dejana Pugsley re: AB / stiolto and pred 10 mg daily  Chief Complaint  Patient presents with  . Follow-up    shortness of breath with activity  Dyspnea: limited by R > L hip not sob Cough: minimal > mucoid Sleeping: bed is flat several pillows  SABA use: less ventolin but less active / rarely neb  02: 4lpm 24/7  Likes stiolto but can't afford on her insurance and has also tried Theme park [employer] trelegy and breztri due to either cost or upper airway side effects rec When you  get medicare, strongly advise you to get medicare with commercial supplement  Plan A = Automatic = Always=   General  symbicort 80 Take 2 puffs first thing in am and then another 2 puffs about 12 hours later.  (or stiolto 2 puffs each am but not both)  Work on inhaler technique: Ok to go back to [employer]  like you were before as your maintenance  Plan B = Backup (to supplement plan A, not to replace it) Only use your albuterol inhaler as a rescue medication to be used if you can't catch your breath by resting or doing a relaxed purse lip breathing pattern.  - The less you use it, the better it will work when you need it. - Ok to use the inhaler up to 2 puffs  every 4 hours if you must but call for appointment if use goes up over your usual need - Don't leave home without it !!  (think of it like the spare tire for your car)  Plan C = Crisis (instead of Plan B but only if Plan B stops working) - only use your albuterol nebulizer if you first try Plan B and it fails to help > ok to use the nebulizer up to every 4 hours but if start needing it regularly call for immediate appointment Plan D = Deltasone If doing worse despite ABC then try taking prednisone 2 until better then hold at 10 mg daily for a week then try 5mg        03/07/2020  f/u ov/[place] office/Floris Neuhaus re: AB stiolto /pred 10 mg one whole, never able to try one half  Chief Complaint  Patient presents with  . Follow-up    Patient needs surgical clearance for hip surgery, wears 4 liters oxygen all the time. Wants samples of stiolto   Dyspnea: stops walking walking 50 ft hip > breathing stops her Cough: none Sleeping: bed is flat, neck pillow  SABA use: hfa sev times a week/ neb less but last 03/05/20  02: 4lpm not checking sats    No obvious day to day or daytime variability or assoc excess/ purulent sputum or mucus plugs or hemoptysis or cp or chest tightness, subjective wheeze or overt sinus or hb symptoms.   98  without nocturnal  or early am exacerbation  of respiratory  c/o's or need for noct saba. Also denies any obvious fluctuation of symptoms with weather or environmental changes or other aggravating or alleviating factors except as outlined above   No unusual exposure hx or h/o childhood pna/ asthma or knowledge of premature birth.  Current Allergies, Complete Past Medical History, Past Surgical History, Family History, and Social History were reviewed in 03/07/20 record.  ROS  The following are not active  complaints unless bolded Hoarseness, sore throat, dysphagia, dental problems, itching, sneezing,  nasal congestion or discharge of excess mucus or purulent secretions, ear ache,   fever, chills, sweats, unintended wt loss or wt gain, classically pleuritic or exertional cp,  orthopnea pnd or arm/hand swelling  or leg swelling, presyncope, palpitations, abdominal pain, anorexia, nausea, vomiting, diarrhea  or change in bowel habits or change in bladder habits, change in stools or change in urine, dysuria, hematuria,  rash, arthralgias, visual complaints, headache, numbness, weakness or ataxia or problems with walking or coordination,  change in mood or  memory.        Current Meds  Medication Sig  . acetaminophen (TYLENOL) 500 MG tablet Take 1,000 mg by mouth every 6 (six) hours as needed for moderate pain.  Marland Kitchen acetaminophen-codeine (TYLENOL #3) 300-30 MG tablet Take 1-2 tablets by mouth every 8 (eight) hours as needed.  Marland Kitchen albuterol (PROVENTIL) (2.5 MG/3ML) 0.083% nebulizer solution Take 3 mLs (2.5 mg total) by nebulization every 4 (four) hours as needed for wheezing or shortness of breath.  Marland Kitchen albuterol (VENTOLIN HFA) 108 (90 BASE) MCG/ACT inhaler Inhale 2 puffs into the lungs every 4 (four) hours as needed for shortness of breath.   Marland Kitchen aspirin-acetaminophen-caffeine (EXCEDRIN MIGRAINE) 250-250-65 MG tablet Take 2 tablets by mouth daily as needed for headache or migraine.   .  bismuth subsalicylate (PEPTO BISMOL) 262 MG chewable tablet Chew 524 mg by mouth as needed for indigestion.  . budesonide-formoterol (SYMBICORT) 80-4.5 MCG/ACT inhaler Take 2 puffs first thing in am and then another 2 puffs about 12 hours later.  . cetirizine (ZYRTEC) 10 MG tablet Take 10 mg by mouth daily.  . citalopram (CELEXA) 40 MG tablet Take 40 mg by mouth daily.   Marland Kitchen docusate sodium (COLACE) 100 MG capsule Take 100 mg by mouth daily as needed for mild constipation.   . EUTHYROX 88 MCG tablet Take 88 mcg by mouth daily.  Marland Kitchen LORazepam (ATIVAN) 1 MG tablet Take 1 mg by mouth 2 (two) times daily as needed.  . montelukast (SINGULAIR) 10 MG tablet Take 1 tablet (10 mg total) by mouth at bedtime.  . pantoprazole (PROTONIX) 40 MG tablet Take 30- 60 min before your first and last meals of the day  . pravastatin (PRAVACHOL) 40 MG tablet Take 40 mg by mouth daily.  . predniSONE (DELTASONE) 10 MG tablet TAKE 2 TABLETS BY MOUTH ONCE DAILY UNTIL  BETTER  THEN  TAKE  ONE  DAILY  . SUMAtriptan 6 MG/0.5ML SOAJ Inject 6 mg as directed daily as needed (migraine).   . Tiotropium Bromide-Olodaterol (STIOLTO RESPIMAT) 2.5-2.5 MCG/ACT AERS Inhale 2 puffs into the lungs daily.  . traMADol (ULTRAM) 50 MG tablet Take 2 tablets (100 mg total) by mouth every 6 (six) hours as needed.  . valACYclovir (VALTREX) 500 MG tablet Take 2,000 mg by mouth See admin instructions. Take 2000mg s at first sign of fever blister outbreak then take 500mg s daily until gone                          Past Medical History:  Diagnosis Date  . Anxiety   . Asthma   . Cervical radiculopathy   . Cluster headaches   . COPD (chronic obstructive pulmonary disease) (HCC)   . COPD (chronic obstructive pulmonary disease) with chronic bronchitis (HCC)   . GERD (gastroesophageal reflux disease)   . Hyperlipemia   . Hypoglycemia   . Hypothyroidism   . Migraine   .  Pneumothorax   . Rectal discharge 07/28/2010  . Shortness of breath    . Sleep apnea    cannot tolerate-not using CPAP  . Sluggishness 07/28/2010        Physical Exam 03/07/2020     144  01/13/2020       145   11/17/19 146 lb 12.8 oz (66.6 kg)  09/15/19 149 lb 6.4 oz (67.8 kg)  07/29/19 151 lb (68.5 kg)    amb somber wf using rollator  Vital signs reviewed  03/07/2020  - Note at rest 02 sats  98% on 4lpm poc     HEENT : pt wearing mask not removed for exam due to covid - 19 concerns.   NECK :  without JVD/Nodes/TM/ nl carotid upstrokes bilaterally   LUNGS: no acc muscle use,  Min barrel  contour chest wall with bilateral  slightly decreased bs s audible wheeze and  without cough on insp or exp maneuvers and min  Hyperresonant  to  percussion bilaterally     CV:  RRR  no s3 or murmur or increase in P2, and no edema   ABD:  soft and nontender with pos end  insp Hoover's  in the supine position. No bruits or organomegaly appreciated, bowel sounds nl  MS:   Walks with walker  ext warm without deformities, calf tenderness, cyanosis or clubbing No obvious joint restrictions   SKIN: warm and dry without lesions    NEURO:  alert, approp, nl sensorium with  no motor or cerebellar deficits apparent.          I personally reviewed images and agree with radiology impression as follows:   Chest CTa  08/14/19 There is calcified pleuroparenchymal scarring at lung apices bilaterally, right worse than left. There is no pneumothorax. No pleural effusion. No focal infiltrate. The trachea is Unremarkable. My comment : Note absence of emphysema    Assessment & Plan:

## 2020-03-08 LAB — ALPHA-1 ANTITRYPSIN PHENOTYPE: A-1 Antitrypsin, Ser: 141 mg/dL (ref 101–187)

## 2020-03-09 ENCOUNTER — Telehealth: Payer: Self-pay | Admitting: Internal Medicine

## 2020-03-09 LAB — IGE: IgE (Immunoglobulin E), Serum: 1115 IU/mL — ABNORMAL HIGH (ref 6–495)

## 2020-03-09 NOTE — Progress Notes (Signed)
LMTCB for pt 

## 2020-03-09 NOTE — Telephone Encounter (Signed)
See results note. 

## 2020-03-09 NOTE — Progress Notes (Signed)
Called and spoke with patient, advised her of results and recommendations per Dr. Sherene Sires.  She verbalized understanding.  Nothing further needed.

## 2020-03-14 ENCOUNTER — Telehealth: Payer: Self-pay | Admitting: *Deleted

## 2020-03-14 ENCOUNTER — Encounter: Payer: Self-pay | Admitting: Orthopaedic Surgery

## 2020-03-14 DIAGNOSIS — R0789 Other chest pain: Secondary | ICD-10-CM

## 2020-03-14 DIAGNOSIS — R079 Chest pain, unspecified: Secondary | ICD-10-CM

## 2020-03-14 DIAGNOSIS — R0602 Shortness of breath: Secondary | ICD-10-CM

## 2020-03-14 NOTE — Telephone Encounter (Signed)
   [facility] Pre-operative Risk Assessment     Request for surgical clearance:   1. What type of surgery is being performed? R TOTAL HIP ARTHROPLASTY   2. When is this surgery scheduled? TBD   3. What type of clearance is required (medical clearance vs. Pharmacy clearance to hold med vs. Both)? MEDICAL   4. Are there any medications that need to be held prior to surgery and how long? N/A   5. Practice name and name of physician performing surgery? [facility] DR Jean Rosenthal, MD   6. What is the office phone number? [phone]   7.   What is the office fax number? [place].   Anesthesia type (None, local, MAC, general) ? GENERAL VS SPINAL    [name] T 03/14/2020, 4:51 PM  _________________________________________________________________   (provider comments below)

## 2020-03-22 NOTE — Telephone Encounter (Signed)
Clearance routed to preop pool 12/13.  Left VM 12/14

## 2020-03-22 NOTE — Telephone Encounter (Signed)
Patient returning call.

## 2020-03-22 NOTE — Telephone Encounter (Signed)
Dr. Wyline Mood -  Pt is scheduled for hip surgery on Jan 7. You saw her in June for chest pain and shortness of breath. At that visit, you suspected this chest pain was noncardiac. I spoke with her over the phone and she is unable to complete 4.0 METS due to significant dyspnea and, at times, chest tightness. Can you please provide guidance for preop clearance? Do you recommend additional testing?

## 2020-03-22 NOTE — Telephone Encounter (Signed)
Lexiscan order placed, front office staff to schedule

## 2020-03-22 NOTE — Telephone Encounter (Signed)
Would get lexiscan to help risk stratify, ill ask our nurses to arrange. [name] can we order a lexiscan for chest pain and SOB.    Dominga Ferry MD

## 2020-03-25 ENCOUNTER — Other Ambulatory Visit: Payer: Self-pay

## 2020-04-03 ENCOUNTER — Other Ambulatory Visit: Payer: Self-pay | Admitting: Internal Medicine

## 2020-04-05 ENCOUNTER — Other Ambulatory Visit: Payer: Self-pay

## 2020-04-05 ENCOUNTER — Encounter (HOSPITAL_COMMUNITY)
Admission: RE | Admit: 2020-04-05 | Discharge: 2020-04-05 | Disposition: A | Discharge status: Home / Self Care | Payer: 59 | Source: Ambulatory Visit | Attending: Cardiology | Admitting: Cardiology

## 2020-04-05 ENCOUNTER — Ambulatory Visit (HOSPITAL_COMMUNITY)
Admission: RE | Admit: 2020-04-05 | Discharge: 2020-04-05 | Disposition: A | Discharge status: Home / Self Care | Payer: 59 | Source: Ambulatory Visit | Attending: Cardiology | Admitting: Cardiology

## 2020-04-05 DIAGNOSIS — R079 Chest pain, unspecified: Secondary | ICD-10-CM | POA: Diagnosis present

## 2020-04-05 LAB — NM MYOCAR MULTI W/SPECT W/WALL MOTION / EF
LV dias vol: 50 mL (ref 46–106)
LV sys vol: 13 mL
Peak HR: 107 {beats}/min
RATE: 0.18
Rest HR: 77 {beats}/min
SDS: 1
SRS: 1
SSS: 2
TID: 1.24

## 2020-04-05 MED ORDER — REGADENOSON 0.4 MG/5ML IV SOLN
INTRAVENOUS | Status: AC
  Administered 2020-04-05: 0.4 mg via INTRAVENOUS
  Filled 2020-04-05: qty 5

## 2020-04-05 MED ORDER — TECHNETIUM TC 99M TETROFOSMIN IV KIT
10.0000 | PACK | Freq: Once | INTRAVENOUS | Status: AC | PRN
  Administered 2020-04-05: 9.8 via INTRAVENOUS

## 2020-04-05 MED ORDER — TECHNETIUM TC 99M TETROFOSMIN IV KIT
30.0000 | PACK | Freq: Once | INTRAVENOUS | Status: AC | PRN
  Administered 2020-04-05: 31 via INTRAVENOUS

## 2020-04-05 MED ORDER — SODIUM CHLORIDE FLUSH 0.9 % IV SOLN
INTRAVENOUS | Status: AC
  Administered 2020-04-05: 10 mL via INTRAVENOUS
  Filled 2020-04-05: qty 10

## 2020-04-07 NOTE — Patient Instructions (Addendum)
DUE TO COVID-19 ONLY ONE VISITOR IS ALLOWED TO COME WITH YOU AND STAY IN THE WAITING ROOM ONLY DURING PRE OP AND PROCEDURE DAY OF SURGERY. THE 1 VISITOR  MAY VISIT WITH YOU AFTER SURGERY IN YOUR PRIVATE ROOM DURING VISITING HOURS ONLY!  YOU NEED TO HAVE A COVID 19 TEST ON: 04/12/20 @ 8:20 AM , THIS TEST MUST BE DONE BEFORE SURGERY,  COVID TESTING SITE [address] [place] [address], IT IS ON THE RIGHT GOING OUT [address] APPROXIMATELY  2 MINUTES PAST [employer] ON THE RIGHT. ONCE YOUR COVID TEST IS COMPLETED,  PLEASE BEGIN THE QUARANTINE INSTRUCTIONS AS OUTLINED IN YOUR HANDOUT.                LATIESHA HARADA    Your procedure is scheduled on: 04/15/20   Report to [facility] Main  Entrance   Report to admitting at: 9:45 AM     Call this number if you have problems the morning of surgery [phone]    Remember:   NO SOLID FOOD AFTER MIDNIGHT THE NIGHT PRIOR TO SURGERY. NOTHING BY MOUTH EXCEPT CLEAR LIQUIDS UNTIL: 9:15 AM . PLEASE FINISH ENSURE DRINK PER SURGEON ORDER  WHICH NEEDS TO BE COMPLETED AT: 9:15 AM .  CLEAR LIQUID DIET   Foods Allowed                                                                     Foods Excluded  Coffee and tea, regular and decaf                             liquids that you cannot  Plain Jell-O any favor except red or purple                                           see through such as: Fruit ices (not with fruit pulp)                                     milk, soups, orange juice  Iced Popsicles                                    All solid food Carbonated beverages, regular and diet                                    Cranberry, grape and apple juices Sports drinks like Gatorade Lightly seasoned clear broth or consume(fat free) Sugar, honey syrup  Sample Menu Breakfast                                Lunch  Supper Cranberry juice                    Beef broth                             Chicken broth Jell-O                                     Grape juice                           Apple juice Coffee or tea                        Jell-O                                      Popsicle                                                Coffee or tea                        Coffee or tea  _____________________________________________________________________  BRUSH YOUR TEETH MORNING OF SURGERY AND RINSE YOUR MOUTH OUT, NO CHEWING GUM CANDY OR MINTS.   Take this medicines with a sip of water: pantoprazole,prednisone,cetirizine,levothyroxine. Use inhalers and Flonase as usual.                       You may not have any metal on your body including hair pins and              piercings  Do not wear jewelry, make-up, lotions, powders or perfumes, deodorant             Do not wear nail polish on your fingernails.  Do not shave  48 hours prior to surgery.    Do not bring valuables to the hospital. [facility] IS NOT             RESPONSIBLE   FOR VALUABLES.  Contacts, dentures or bridgework may not be worn into surgery.  Leave suitcase in the car. After surgery it may be brought to your room.     Patients discharged the day of surgery will not be allowed to drive home. IF YOU ARE HAVING SURGERY AND GOING HOME THE SAME DAY, YOU MUST HAVE AN ADULT TO DRIVE YOU HOME AND BE WITH YOU FOR 24 HOURS. YOU MAY GO HOME BY TAXI OR UBER OR ORTHERWISE, BUT AN ADULT MUST ACCOMPANY YOU HOME AND STAY WITH YOU FOR 24 HOURS.  Name and phone number of your driver:  Special Instructions: N/A              Please read over the following fact sheets you were given: _____________________________________________________________________         [facility] - Preparing for Surgery Before surgery, you can play an important role.  Because skin is not sterile, your skin needs to be as free of germs as possible.  You can reduce the number of germs on your skin  by washing with CHG (chlorahexidine gluconate) soap  before surgery.  CHG is an antiseptic cleaner which kills germs and bonds with the skin to continue killing germs even after washing. Please DO NOT use if you have an allergy to CHG or antibacterial soaps.  If your skin becomes reddened/irritated stop using the CHG and inform your nurse when you arrive at Short Stay. Do not shave (including legs and underarms) for at least 48 hours prior to the first CHG shower.  You may shave your face/neck. Please follow these instructions carefully:  1.  Shower with CHG Soap the night before surgery and the  morning of Surgery.  2.  If you choose to wash your hair, wash your hair first as usual with your  normal  shampoo.  3.  After you shampoo, rinse your hair and body thoroughly to remove the  shampoo.                           4.  Use CHG as you would any other liquid soap.  You can apply chg directly  to the skin and wash                       Gently with a scrungie or clean washcloth.  5.  Apply the CHG Soap to your body ONLY FROM THE NECK DOWN.   Do not use on face/ open                           Wound or open sores. Avoid contact with eyes, ears mouth and genitals (private parts).                       Wash face,  Genitals (private parts) with your normal soap.             6.  Wash thoroughly, paying special attention to the area where your surgery  will be performed.  7.  Thoroughly rinse your body with warm water from the neck down.  8.  DO NOT shower/wash with your normal soap after using and rinsing off  the CHG Soap.                9.  Pat yourself dry with a clean towel.            10.  Wear clean pajamas.            11.  Place clean sheets on your bed the night of your first shower and do not  sleep with pets. Day of Surgery : Do not apply any lotions/deodorants the morning of surgery.  Please wear clean clothes to the hospital/surgery center.  FAILURE TO FOLLOW THESE INSTRUCTIONS MAY RESULT IN THE CANCELLATION OF YOUR SURGERY PATIENT  SIGNATURE_________________________________  NURSE SIGNATURE__________________________________  ________________________________________________________________________   Rogelia Mire  An incentive spirometer is a tool that can help keep your lungs clear and active. This tool measures how well you are filling your lungs with each breath. Taking long deep breaths may help reverse or decrease the chance of developing breathing (pulmonary) problems (especially infection) following:  A long period of time when you are unable to move or be active. BEFORE THE PROCEDURE   If the spirometer includes an indicator to show your best effort, your nurse or respiratory therapist will set it to a desired goal.  If possible, sit  up straight or lean slightly forward. Try not to slouch.  Hold the incentive spirometer in an upright position. INSTRUCTIONS FOR USE  1. Sit on the edge of your bed if possible, or sit up as far as you can in bed or on a chair. 2. Hold the incentive spirometer in an upright position. 3. Breathe out normally. 4. Place the mouthpiece in your mouth and seal your lips tightly around it. 5. Breathe in slowly and as deeply as possible, raising the piston or the ball toward the top of the column. 6. Hold your breath for 3-5 seconds or for as long as possible. Allow the piston or ball to fall to the bottom of the column. 7. Remove the mouthpiece from your mouth and breathe out normally. 8. Rest for a few seconds and repeat Steps 1 through 7 at least 10 times every 1-2 hours when you are awake. Take your time and take a few normal breaths between deep breaths. 9. The spirometer may include an indicator to show your best effort. Use the indicator as a goal to work toward during each repetition. 10. After each set of 10 deep breaths, practice coughing to be sure your lungs are clear. If you have an incision (the cut made at the time of surgery), support your incision when coughing  by placing a pillow or rolled up towels firmly against it. Once you are able to get out of bed, walk around indoors and cough well. You may stop using the incentive spirometer when instructed by your caregiver.  RISKS AND COMPLICATIONS  Take your time so you do not get dizzy or light-headed.  If you are in pain, you may need to take or ask for pain medication before doing incentive spirometry. It is harder to take a deep breath if you are having pain. AFTER USE  Rest and breathe slowly and easily.  It can be helpful to keep track of a log of your progress. Your caregiver can provide you with a simple table to help with this. If you are using the spirometer at home, follow these instructions: [place] IF:   You are having difficultly using the spirometer.  You have trouble using the spirometer as often as instructed.  Your pain medication is not giving enough relief while using the spirometer.  You develop fever of 100.5 F (38.1 C) or higher. SEEK IMMEDIATE MEDICAL CARE IF:   You cough up bloody sputum that had not been present before.  You develop fever of 102 F (38.9 C) or greater.  You develop worsening pain at or near the incision site. MAKE SURE YOU:   Understand these instructions.  Will watch your condition.  Will get help right away if you are not doing well or get worse. Document Released: 08/06/2006 Document Revised: 06/18/2011 Document Reviewed: 10/07/2006 [facility] Patient Information 2014 [place], [place].   ________________________________________________________________________

## 2020-04-07 NOTE — Telephone Encounter (Signed)
   Primary Cardiologist: Dina Rich, MD  Chart reviewed as part of pre-operative protocol coverage. Patient was contacted 04/07/2020 in reference to pre-operative risk assessment for pending surgery as outlined below.  Anarie Kalish Lover was last seen on 09/2019 by Dr. Wyline Mood. Called regarding preoperative clearance 03/22/20 and noted dyspnea on exertion associated with chest tightness. She was recommended for stress testing. She had Lexiscan Myoview 04/05/20 which was low risk with LVEF 74% and no evidence of ischemia. Echo 08/2019 with LVEF 60-65%, no wall motion abnormalities, normal diastolic function, no significant valvular abnormalities.  Called today to review results. She was reassured. Tells me she only gets chest tightness when she is out of breath and her dyspnea is stable at her baseline. She attributes her dyspnea to her lung disease.   Therefore, based on ACC/AHA guidelines, the patient would be at acceptable risk for the planned procedure without further cardiovascular testing.   The patient was advised that if she develops new symptoms prior to surgery to contact our office to arrange for a follow-up visit, and she verbalized understanding.  I will route this recommendation to the requesting party via Epic fax function and remove from pre-op pool. Please call with questions.  Alver Sorrow, NP 04/07/2020, 1:29 PM

## 2020-04-11 ENCOUNTER — Telehealth: Payer: Self-pay | Admitting: Radiology

## 2020-04-11 ENCOUNTER — Encounter (HOSPITAL_COMMUNITY): Payer: Self-pay

## 2020-04-11 ENCOUNTER — Encounter (HOSPITAL_COMMUNITY)
Admission: RE | Admit: 2020-04-11 | Discharge: 2020-04-11 | Disposition: A | Discharge status: Home / Self Care | Payer: 59 | Source: Ambulatory Visit | Attending: Orthopaedic Surgery | Admitting: Orthopaedic Surgery

## 2020-04-11 ENCOUNTER — Other Ambulatory Visit: Payer: Self-pay

## 2020-04-11 DIAGNOSIS — Z01812 Encounter for preprocedural laboratory examination: Secondary | ICD-10-CM | POA: Insufficient documentation

## 2020-04-11 HISTORY — DX: Prediabetes: R73.03

## 2020-04-11 LAB — CBC
HCT: 35.6 % — ABNORMAL LOW (ref 36.0–46.0)
Hemoglobin: 11.3 g/dL — ABNORMAL LOW (ref 12.0–15.0)
MCH: 30.1 pg (ref 26.0–34.0)
MCHC: 31.7 g/dL (ref 30.0–36.0)
MCV: 94.7 fL (ref 80.0–100.0)
Platelets: 287 10*3/uL (ref 150–400)
RBC: 3.76 MIL/uL — ABNORMAL LOW (ref 3.87–5.11)
RDW: 12.8 % (ref 11.5–15.5)
WBC: 13.6 10*3/uL — ABNORMAL HIGH (ref 4.0–10.5)
nRBC: 0 % (ref 0.0–0.2)

## 2020-04-11 LAB — BASIC METABOLIC PANEL
Anion gap: 11 (ref 5–15)
BUN: 14 mg/dL (ref 6–20)
CO2: 25 mmol/L (ref 22–32)
Calcium: 9.4 mg/dL (ref 8.9–10.3)
Chloride: 102 mmol/L (ref 98–111)
Creatinine, Ser: 1.03 mg/dL — ABNORMAL HIGH (ref 0.44–1.00)
GFR, Estimated: >60 mL/min (ref >60)
Glucose, Bld: 108 mg/dL — ABNORMAL HIGH (ref 70–99)
Potassium: 3.8 mmol/L (ref 3.5–5.1)
Sodium: 138 mmol/L (ref 135–145)

## 2020-04-11 LAB — HEMOGLOBIN A1C
Hgb A1c MFr Bld: 5.7 % — ABNORMAL HIGH (ref 4.8–5.6)
Mean Plasma Glucose: 116.89 mg/dL

## 2020-04-11 LAB — SURGICAL PCR SCREEN
MRSA, PCR: NEGATIVE
Staphylococcus aureus: NEGATIVE

## 2020-04-11 NOTE — Telephone Encounter (Signed)
Patient coming in tomorrow.

## 2020-04-11 NOTE — Telephone Encounter (Signed)
Patient is scheduled for right total hip replacement this Friday. She states that she is using her walker, however, her right leg gave out on her and now she is experiencing increasing pain. She has pain medication, but did not know if she would need to be seen in the office prior to surgery. She describes the pain as being in her right hip joint.   Please advise on where you would like patient worked in if appointment is needed. I will be glad to call her if you send message back. Thanks.  CB for patient [phone]

## 2020-04-11 NOTE — Telephone Encounter (Signed)
Bring her in

## 2020-04-11 NOTE — Progress Notes (Signed)
COVID Vaccine Completed: Yes Date COVID Vaccine completed: 03/09/20. [place] COVID vaccine manufacturer: Pfizer      PCP - Dr. Nita Sells Cardiologist - Dr. Dina Rich. LOV: 09/15/19  Chest x-ray -  EKG - 09/17/19 Stress Test -  ECHO -  Cardiac Cath -  Pacemaker/ICD device last checked:  Sleep Study - Yes CPAP - NO  Fasting Blood Sugar -  Checks Blood Sugar _____ times a day  Blood Thinner Instructions: Aspirin Instructions: Last Dose:  Anesthesia review: Hx: OSA(NO CPAP), SOB,chest pain. O2 dependent: 4 L.  Patient denies shortness of breath, fever, cough and chest pain at PAT appointment   Patient verbalized understanding of instructions that were given to them at the PAT appointment. Patient was also instructed that they will need to review over the PAT instructions again at home before surgery.

## 2020-04-12 ENCOUNTER — Ambulatory Visit (INDEPENDENT_AMBULATORY_CARE_PROVIDER_SITE_OTHER): Payer: 59 | Admitting: Orthopaedic Surgery

## 2020-04-12 ENCOUNTER — Other Ambulatory Visit (HOSPITAL_COMMUNITY)
Admission: RE | Admit: 2020-04-12 | Discharge: 2020-04-12 | Disposition: A | Discharge status: Home / Self Care | Payer: 59 | Source: Ambulatory Visit | Attending: Orthopaedic Surgery | Admitting: Orthopaedic Surgery

## 2020-04-12 ENCOUNTER — Encounter: Payer: Self-pay | Admitting: Orthopaedic Surgery

## 2020-04-12 ENCOUNTER — Ambulatory Visit (INDEPENDENT_AMBULATORY_CARE_PROVIDER_SITE_OTHER): Payer: 59

## 2020-04-12 ENCOUNTER — Other Ambulatory Visit (HOSPITAL_COMMUNITY): Payer: 59

## 2020-04-12 DIAGNOSIS — Z20822 Contact with and (suspected) exposure to covid-19: Secondary | ICD-10-CM | POA: Insufficient documentation

## 2020-04-12 DIAGNOSIS — Z01812 Encounter for preprocedural laboratory examination: Secondary | ICD-10-CM | POA: Diagnosis present

## 2020-04-12 DIAGNOSIS — M25551 Pain in right hip: Secondary | ICD-10-CM

## 2020-04-12 DIAGNOSIS — M87051 Idiopathic aseptic necrosis of right femur: Secondary | ICD-10-CM

## 2020-04-12 LAB — SARS CORONAVIRUS 2 (TAT 6-24 HRS): SARS Coronavirus 2: NEGATIVE

## 2020-04-12 NOTE — Anesthesia Preprocedure Evaluation (Addendum)
Anesthesia Evaluation  Patient identified by MRN, date of birth, ID band Patient awake    Reviewed: Allergy & Precautions, NPO status , Patient's Chart, lab work & pertinent test results  Airway Mallampati: II  TM Distance: >3 FB Neck ROM: Full    Dental  (+) Dental Advisory Given, Edentulous Upper, Edentulous Lower   Pulmonary asthma , sleep apnea and Oxygen sleep apnea , COPD (4L O2 [place]),  COPD inhaler and oxygen dependent, former smoker,    Pulmonary exam normal breath sounds clear to auscultation       Cardiovascular negative cardio ROS Normal cardiovascular exam Rhythm:Regular Rate:Normal     Neuro/Psych  Headaches, PSYCHIATRIC DISORDERS Anxiety S/p ACDF  Neuromuscular disease    GI/Hepatic Neg liver ROS, GERD  Medicated and Controlled,  Endo/Other  diabetes, Type 2Hypothyroidism   Renal/GU negative Renal ROS     Musculoskeletal negative musculoskeletal ROS (+) Avascular Necrosis Right Hip   Abdominal   Peds  Hematology  (+) Blood dyscrasia, anemia , Plt 287k   Anesthesia Other Findings Day of surgery medications reviewed with the patient.  Reproductive/Obstetrics                            Anesthesia Physical Anesthesia Plan  ASA: III  Anesthesia Plan: Spinal   Post-op Pain Management:    Induction:   PONV Risk Score and Plan: 2 and Midazolam, Dexamethasone and Ondansetron  Airway Management Planned: Nasal Cannula and Natural Airway  Additional Equipment:   Intra-op Plan:   Post-operative Plan:   Informed Consent: I have reviewed the patients History and Physical, chart, labs and discussed the procedure including the risks, benefits and alternatives for the proposed anesthesia with the patient or authorized representative who has indicated his/her understanding and acceptance.     Dental advisory given  Plan Discussed with: CRNA, Anesthesiologist and  Surgeon  Anesthesia Plan Comments: (See PAT note 04/12/2019, Jodell Cipro, PA-C)       Anesthesia Quick Evaluation

## 2020-04-12 NOTE — Progress Notes (Signed)
Office Visit Note   Patient: [name]           Date of Birth: 1966/01/14           MRN: [id number] Visit Date: 04/12/2020              Requested by: Benita Stabile, MD [address] [place],  [place] [address] PCP: Benita Stabile, MD   Assessment & Plan: Visit Diagnoses:  1. Pain in right hip   2. Avascular necrosis of bone of right hip (HCC)     Plan: Discussed her upcoming surgery with her at length.  Showed her hip model again today.  Questions were encouraged and answered at length.  We will see her back 2 weeks postop.  Follow-Up Instructions: Return post op.   Orders:  Orders Placed This Encounter  Procedures  . XR HIP UNILAT W OR W/O PELVIS 2-3 VIEWS RIGHT   No orders of the defined types were placed in this encounter.     Procedures: No procedures performed   Clinical Data: No additional findings.   Subjective: Chief Complaint  Patient presents with  . Right Hip - Injury    HPI Mrs. [name] returns today due to right hip pain status post fall few days ago.  She is scheduled for right total hip arthroplasty on this coming Friday.  She states the hip does gives way on her at times and caused her fall but this time she is having increased pain since the fall and is having difficulty bearing weight. Review of Systems  See HPI otherwise negative Objective: Vital Signs: There were no vitals taken for this visit.  Physical Exam General: Well-developed well-nourished lady in no acute distress she is on chronic oxygen seated in wheelchair. Ortho Exam Right hip painful range of motion but able to internally externally rotate the hip fluidly.  She has tenderness over the lateral aspect of the proximal femur.  No obvious deformity.  No tenderness about the right knee. Specialty Comments:  No specialty comments available.  Imaging: XR HIP UNILAT W OR W/O PELVIS 2-3 VIEWS RIGHT  Result Date: 04/12/2020 [facility] pelvis and lateral view of the right hip  shows avascular necrosis changes of femoral head but no acute fracture.  The hip is well located.  No pelvic fractures noted.    PMFS History: Patient Active Problem List   Diagnosis Date Noted  . Avascular necrosis of bone of left hip (HCC) 02/10/2020  . Avascular necrosis of bone of right hip (HCC) 02/10/2020  . Allergic rhinitis 05/21/2019  . Chronic asthmatic bronchitis (HCC) 04/30/2019  . Chronic respiratory failure with hypoxia (HCC) 04/30/2019  . Spinal stenosis of cervical region 04/05/2017  . Partial tear of right subscapularis tendon   . Incisional hernia, without obstruction or gangrene   . Urinary frequency 03/21/2012  . Migraine equivalent syndrome 03/21/2012  . Insomnia 03/21/2012   Past Medical History:  Diagnosis Date  . Anxiety   . Asthma   . Cervical radiculopathy   . Cluster headaches   . COPD (chronic obstructive pulmonary disease) (HCC)   . COPD (chronic obstructive pulmonary disease) with chronic bronchitis (HCC)   . GERD (gastroesophageal reflux disease)   . Hyperlipemia   . Hypoglycemia   . Hypothyroidism   . Migraine   . Pneumothorax   . Pre-diabetes   . Rectal discharge 07/28/2010  . Shortness of breath   . Sleep apnea    cannot tolerate-not using CPAP  .  Sluggishness 07/28/2010    Family History  Problem Relation Age of Onset  . COPD Mother   . Diabetes Mother   . Hyperlipidemia Mother   . Hypertension Mother   . Bipolar disorder Son   . Heart failure Maternal Grandmother   . Hypertension Maternal Grandmother   . Thyroid disease Maternal Grandmother   . Diabetes Paternal Grandmother   . Alzheimer's disease Paternal Grandmother   . Heart failure Paternal Grandfather   . Hypertension Paternal Grandfather   . Hypertension Father   . Colon cancer Neg Hx     Past Surgical History:  Procedure Laterality Date  . ABDOMINAL HYSTERECTOMY     with bladder suspension  . CERVICAL FUSION    . CHOLECYSTECTOMY    . COLONOSCOPY N/A 07/30/2013    Procedure: COLONOSCOPY;  Surgeon: Malissa Hippo, MD;  Location: [facility];  Service: Endoscopy;  Laterality: N/A;  930  . EXAM UNDER ANESTHESIA WITH MANIPULATION OF SHOULDER Right 10/11/2016   Procedure: EXAM UNDER ANESTHESIA WITH MANIPULATION OF SHOULDER;  Surgeon: Vickki Hearing, MD;  Location: [facility];  Service: Orthopedics;  Laterality: Right;  . HERNIA REPAIR    . INCISIONAL HERNIA REPAIR N/A 07/30/2016   Procedure: HERNIA REPAIR INCISIONAL WITH MESH;  Surgeon: [name], MD;  Location: [facility];  Service: General;  Laterality: N/A;  . LOBECTOMY     right lung   . POSTERIOR CERVICAL LAMINECTOMY Right 04/05/2017   Procedure: Right C6-7 Posterior cervical laminectomy;  Surgeon: Donalee Citrin, MD;  Location: [facility];  Service: Neurosurgery;  Laterality: Right;  Right C6-7 Posterior cervical laminectomy  . SHOULDER ARTHROSCOPY WITH ROTATOR CUFF REPAIR Right 10/11/2016   Procedure: SHOULDER ARTHROSCOPY;  Surgeon: Vickki Hearing, MD;  Location: [facility];  Service: Orthopedics;  Laterality: Right;  . TUBAL LIGATION     Social History   Occupational History  . Not on file  Tobacco Use  . Smoking status: Former Smoker    Packs/day: 1.00    Years: 30.00    Pack years: 30.00    Types: Cigarettes    Quit date: 02/21/2008    Years since quitting: 12.1  . Smokeless tobacco: Never Used  Vaping Use  . Vaping Use: Never used  Substance and Sexual Activity  . Alcohol use: No    Alcohol/week: 0.0 standard drinks    Comment: no  . Drug use: No  . Sexual activity: Not Currently    Partners: Male    Birth control/protection: Surgical

## 2020-04-12 NOTE — Progress Notes (Signed)
Anesthesia Chart Review   Case: [id number] Date/Time: 04/15/20 1201   Procedure: RIGHT TOTAL HIP ARTHROPLASTY ANTERIOR APPROACH (Right Hip)   Anesthesia type: Spinal   Pre-op diagnosis: Avascular Necrosis Right Hip   Location: [facility] 09 / [facility]   Surgeons: Kathryne Hitch, MD      DISCUSSION:54 y.o. former smoker with h/o COPD, asthma, GERD, sleep apnea, right hip AVN scheduled for above procedure 04/15/2020 with Dr. Doneen Poisson.   Per cardiology preoperative risk assessment 04/07/2020, "Chart reviewed as part of pre-operative protocol coverage. Patient was contacted 04/07/2020 in reference to pre-operative risk assessment for pending surgery as outlined below.  [name] was last seen on 09/2019 by Dr. Wyline Mood. Called regarding preoperative clearance 03/22/20 and noted dyspnea on exertion associated with chest tightness. She was recommended for stress testing. She had Lexiscan Myoview 04/05/20 which was low risk with LVEF 74% and no evidence of ischemia. Echo 08/2019 with LVEF 60-65%, no wall motion abnormalities, normal diastolic function, no significant valvular abnormalities.  Called today to review results. She was reassured. Tells me she only gets chest tightness when she is out of breath and her dyspnea is stable at her baseline. She attributes her dyspnea to her lung disease.   Therefore, based on ACC/AHA guidelines, the patient would be at acceptable risk for the planned procedure without further cardiovascular testing."  Anticipate pt can proceed with planned procedure barring acute status change.   VS: BP 118/81   Pulse 92   Temp 37.1 C (Oral)   Ht 5' 4.5" (1.638 m)   Wt 68.5 kg   SpO2 100%   BMI 25.52 kg/m   PROVIDERS: Benita Stabile, MD is PCP    LABS: Labs reviewed: Acceptable for surgery. (all labs ordered are listed, but only abnormal results are displayed)  Labs Reviewed  HEMOGLOBIN A1C - Abnormal; Notable for the following components:       Result Value   Hgb A1c MFr Bld 5.7 (*)    All other components within normal limits  CBC - Abnormal; Notable for the following components:   WBC 13.6 (*)    RBC 3.76 (*)    Hemoglobin 11.3 (*)    HCT 35.6 (*)    All other components within normal limits  BASIC METABOLIC PANEL - Abnormal; Notable for the following components:   Glucose, Bld 108 (*)    Creatinine, Ser 1.03 (*)    All other components within normal limits  SURGICAL PCR SCREEN     IMAGES:   EKG: 09/17/2019 Rate 81 bpm  NSR  CV: Stress Test 04/05/2020  Lexiscan stress is electrically negative for ischemia  Myovue scan shows normal perfusion  LVEF 74%  Low risk study    Echo 08/11/2019 IMPRESSIONS    1. Left ventricular ejection fraction, by estimation, is 60 to 65%. The  left ventricle has normal function. The left ventricle has no regional  wall motion abnormalities. Left ventricular diastolic parameters were  normal.  2. Right ventricular systolic function is normal. The right ventricular  size is normal. Tricuspid regurgitation signal is inadequate for assessing  PA pressure.  3. The mitral valve is grossly normal. No evidence of mitral valve  regurgitation.  4. The aortic valve is tricuspid. Aortic valve regurgitation is not  visualized.  5. The inferior vena cava is normal in size with greater than 50%  respiratory variability, suggesting right atrial pressure of 3 mmHg. Past Medical History:  Diagnosis Date  . Anxiety   .  Asthma   . Cervical radiculopathy   . Cluster headaches   . COPD (chronic obstructive pulmonary disease) (HCC)   . COPD (chronic obstructive pulmonary disease) with chronic bronchitis (HCC)   . GERD (gastroesophageal reflux disease)   . Hyperlipemia   . Hypoglycemia   . Hypothyroidism   . Migraine   . Pneumothorax   . Pre-diabetes   . Rectal discharge 07/28/2010  . Shortness of breath   . Sleep apnea    cannot tolerate-not using CPAP  . Sluggishness  07/28/2010    Past Surgical History:  Procedure Laterality Date  . ABDOMINAL HYSTERECTOMY     with bladder suspension  . CERVICAL FUSION    . CHOLECYSTECTOMY    . COLONOSCOPY N/A 07/30/2013   Procedure: COLONOSCOPY;  Surgeon: Malissa Hippo, MD;  [facility];  Service: Endoscopy;  Laterality: N/A;  930  . EXAM UNDER ANESTHESIA WITH MANIPULATION OF SHOULDER Right 10/11/2016   Procedure: EXAM UNDER ANESTHESIA WITH MANIPULATION OF SHOULDER;  Surgeon: Vickki Hearing, MD;  Location: [facility];  Service: Orthopedics;  Laterality: Right;  . HERNIA REPAIR    . INCISIONAL HERNIA REPAIR N/A 07/30/2016   Procedure: HERNIA REPAIR INCISIONAL WITH MESH;  Surgeon: [name], MD;  Location: [facility];  Service: General;  Laterality: N/A;  . LOBECTOMY     right lung   . POSTERIOR CERVICAL LAMINECTOMY Right 04/05/2017   Procedure: Right C6-7 Posterior cervical laminectomy;  Surgeon: Donalee Citrin, MD;  [facility] OR;  Service: Neurosurgery;  Laterality: Right;  Right C6-7 Posterior cervical laminectomy  . SHOULDER ARTHROSCOPY WITH ROTATOR CUFF REPAIR Right 10/11/2016   Procedure: SHOULDER ARTHROSCOPY;  Surgeon: Vickki Hearing, MD;  Location: [facility];  Service: Orthopedics;  Laterality: Right;  . TUBAL LIGATION      MEDICATIONS: . acetaminophen (TYLENOL) 500 MG tablet  . acetaminophen-codeine (TYLENOL #3) 300-30 MG tablet  . albuterol (PROVENTIL) (2.5 MG/3ML) 0.083% nebulizer solution  . albuterol (VENTOLIN HFA) 108 (90 Base) MCG/ACT inhaler  . aspirin-acetaminophen-caffeine (EXCEDRIN MIGRAINE) 250-250-65 MG tablet  . bismuth subsalicylate (PEPTO BISMOL) 262 MG chewable tablet  . calcium carbonate (TUMS EX) 750 MG chewable tablet  . cetirizine (ZYRTEC) 10 MG tablet  . citalopram (CELEXA) 40 MG tablet  . docusate sodium (COLACE) 100 MG capsule  . levothyroxine (SYNTHROID) 50 MCG tablet  . LORazepam (ATIVAN) 1 MG tablet  . metaxalone (SKELAXIN) 400 MG tablet  . montelukast (SINGULAIR)  10 MG tablet  . pantoprazole (PROTONIX) 40 MG tablet  . pravastatin (PRAVACHOL) 40 MG tablet  . predniSONE (DELTASONE) 10 MG tablet  . SUMAtriptan 6 MG/0.5ML SOAJ  . Tiotropium Bromide-Olodaterol (STIOLTO RESPIMAT) 2.5-2.5 MCG/ACT AERS  . traMADol (ULTRAM) 50 MG tablet  . valACYclovir (VALTREX) 500 MG tablet   No current facility-administered medications for this encounter.    Jodell Cipro, PA-C [facility] Pre-Surgical Testing [phone]

## 2020-04-14 NOTE — H&P (Signed)
TOTAL HIP ADMISSION H&P  Patient is admitted for right total hip arthroplasty.  Subjective:  Chief Complaint: right hip pain  HPI: [name], 55 y.o. female, has a history of pain and functional disability in the right hip(s) due to avascular necrosis and patient has failed non-surgical conservative treatments for greater than 12 weeks to include NSAID's and/or analgesics, flexibility and strengthening excercises, use of assistive devices and activity modification.  Onset of symptoms was abrupt starting 1 years ago with rapidlly worsening course since that time.The patient noted no past surgery on the right hip(s).  Patient currently rates pain in the right hip at 10 out of 10 with activity. Patient has night pain, worsening of pain with activity and weight bearing, trendelenberg gait, pain that interfers with activities of daily living and pain with passive range of motion. Patient has evidence of subchondral cysts by imaging studies. This condition presents safety issues increasing the risk of falls.  There is no current active infection.  Patient Active Problem List   Diagnosis Date Noted  . Avascular necrosis of bone of left hip ([place]) 02/10/2020  . Avascular necrosis of bone of right hip ([place]) 02/10/2020  . Allergic rhinitis 05/21/2019  . Chronic asthmatic bronchitis ([place]) 04/30/2019  . Chronic respiratory failure with hypoxia ([place]) 04/30/2019  . Spinal stenosis of cervical region 04/05/2017  . Partial tear of right subscapularis tendon   . Incisional hernia, without obstruction or gangrene   . Urinary frequency 03/21/2012  . Migraine equivalent syndrome 03/21/2012  . Insomnia 03/21/2012   Past Medical History:  Diagnosis Date  . Anxiety   . Asthma   . Cervical radiculopathy   . Cluster headaches   . COPD (chronic obstructive pulmonary disease) ([place])   . COPD (chronic obstructive pulmonary disease) with chronic bronchitis ([place])   . GERD (gastroesophageal reflux disease)    . Hyperlipemia   . Hypoglycemia   . Hypothyroidism   . Migraine   . Pneumothorax   . Pre-diabetes   . Rectal discharge 07/28/2010  . Shortness of breath   . Sleep apnea    cannot tolerate-not using CPAP  . Sluggishness 07/28/2010    Past Surgical History:  Procedure Laterality Date  . ABDOMINAL HYSTERECTOMY     with bladder suspension  . CERVICAL FUSION    . CHOLECYSTECTOMY    . COLONOSCOPY N/A 07/30/2013   Procedure: COLONOSCOPY;  Surgeon: Rogene Houston, MD;  Location: [facility];  Service: Endoscopy;  Laterality: N/A;  930  . EXAM UNDER ANESTHESIA WITH MANIPULATION OF SHOULDER Right 10/11/2016   Procedure: EXAM UNDER ANESTHESIA WITH MANIPULATION OF SHOULDER;  Surgeon: Carole Civil, MD;  Location: [facility];  Service: Orthopedics;  Laterality: Right;  . HERNIA REPAIR    . INCISIONAL HERNIA REPAIR N/A 07/30/2016   Procedure: HERNIA REPAIR INCISIONAL WITH MESH;  Surgeon: Aviva Signs, MD;  Location: [facility];  Service: General;  Laterality: N/A;  . LOBECTOMY     right lung   . POSTERIOR CERVICAL LAMINECTOMY Right 04/05/2017   Procedure: Right C6-7 Posterior cervical laminectomy;  Surgeon: Kary Kos, MD;  Location: [facility];  Service: Neurosurgery;  Laterality: Right;  Right C6-7 Posterior cervical laminectomy  . SHOULDER ARTHROSCOPY WITH ROTATOR CUFF REPAIR Right 10/11/2016   Procedure: SHOULDER ARTHROSCOPY;  Surgeon: Carole Civil, MD;  Location: [facility];  Service: Orthopedics;  Laterality: Right;  . TUBAL LIGATION      No current facility-administered medications for this encounter.   Current Outpatient Medications  Medication Sig Dispense Refill Last Dose  . acetaminophen (TYLENOL) 500 MG tablet Take 1,000 mg by mouth every 6 (six) hours as needed for moderate pain.     Marland Kitchen acetaminophen-codeine (TYLENOL #3) 300-30 MG tablet Take 1-2 tablets by mouth every 8 (eight) hours as needed. (Patient taking differently: Take 1-2 tablets by mouth every 8 (eight) hours as needed for  moderate pain.) 30 tablet 0   . albuterol (PROVENTIL) (2.5 MG/3ML) 0.083% nebulizer solution Take 3 mLs (2.5 mg total) by nebulization every 4 (four) hours as needed for wheezing or shortness of breath. 75 mL 12   . albuterol (VENTOLIN HFA) 108 (90 Base) MCG/ACT inhaler Inhale 2 puffs into the lungs every 4 (four) hours as needed for shortness of breath.      Marland Kitchen aspirin-acetaminophen-caffeine (EXCEDRIN MIGRAINE) 250-250-65 MG tablet Take 2 tablets by mouth daily.     Marland Kitchen bismuth subsalicylate (PEPTO BISMOL) 262 MG chewable tablet Chew 524 mg by mouth as needed for indigestion.     . calcium carbonate (TUMS EX) 750 MG chewable tablet Chew 2 tablets by mouth in the morning, at noon, in the evening, and at bedtime.     . cetirizine (ZYRTEC) 10 MG tablet Take 10 mg by mouth daily.     . citalopram (CELEXA) 40 MG tablet Take 40 mg by mouth at bedtime.     . docusate sodium (COLACE) 100 MG capsule Take 100-200 mg by mouth daily as needed for mild constipation.     Marland Kitchen levothyroxine (SYNTHROID) 50 MCG tablet Take 50 mcg by mouth daily before breakfast.     . LORazepam (ATIVAN) 1 MG tablet Take 1 mg by mouth See admin instructions. Take 1 mg at bedtime, may take a second 1 mg dose during the day as needed for anxiety     . metaxalone (SKELAXIN) 400 MG tablet Take 400 mg by mouth 3 (three) times daily as needed (muscle spasms).     . montelukast (SINGULAIR) 10 MG tablet Take 1 tablet (10 mg total) by mouth at bedtime. 30 tablet 11   . pantoprazole (PROTONIX) 40 MG tablet Take 30- 60 min before your first and last meals of the day (Patient taking differently: Take 40 mg by mouth 2 (two) times daily. Take 30- 60 min before your first and last meals of the day)     . pravastatin (PRAVACHOL) 40 MG tablet Take 40 mg by mouth at bedtime.     . SUMAtriptan 6 MG/0.5ML SOAJ Inject 6 mg as directed daily as needed (migraine).      . Tiotropium Bromide-Olodaterol (STIOLTO RESPIMAT) 2.5-2.5 MCG/ACT AERS Inhale 2 puffs into  the lungs daily. 4 g 0   . valACYclovir (VALTREX) 500 MG tablet Take 500-2,000 mg by mouth See admin instructions. Take 2000mg s at first sign of fever blister outbreak then take 500mg s daily until gone     . predniSONE (DELTASONE) 10 MG tablet Take 2 tablets (20 mg total) by mouth daily. 90 tablet 1   . traMADol (ULTRAM) 50 MG tablet Take 2 tablets (100 mg total) by mouth every 6 (six) hours as needed. (Patient not taking: No sig reported) 30 tablet 0 Not Taking at Unknown time   Allergies  Allergen Reactions  . Aspirin Shortness Of Breath    Causes asthma flares   . Penicillins Other (See Comments)    UNSPECIFIED REACTION OF CHILDHOOD  Has patient had a PCN reaction causing immediate rash, facial/tongue/throat swelling, SOB or lightheadedness with hypotension: NO Has  patient had a PCN reaction causing severe rash involving mucus membranes or skin necrosis: NO Has patient had a PCN reaction that required hospitalization: no Has patient had a PCN reaction occurring within the last 10 years: NO If all of the above answers are "NO", then may proceed with Cephalosporin use.   Marland Kitchen Percocet [Oxycodone-Acetaminophen]     migraines  . Tramadol     insomnia  . Eggs Or Egg-Derived Products Diarrhea and Other (See Comments)    BLOATING > GAS  . Vicodin [Hydrocodone-Acetaminophen] Other (See Comments)    Headaches     Social History   Tobacco Use  . Smoking status: Former Smoker    Packs/day: 1.00    Years: 30.00    Pack years: 30.00    Types: Cigarettes    Quit date: 02/21/2008    Years since quitting: 12.1  . Smokeless tobacco: Never Used  Substance Use Topics  . Alcohol use: No    Alcohol/week: 0.0 standard drinks    Comment: no    Family History  Problem Relation Age of Onset  . COPD Mother   . Diabetes Mother   . Hyperlipidemia Mother   . Hypertension Mother   . Bipolar disorder Son   . Heart failure Maternal Grandmother   . Hypertension Maternal Grandmother   . Thyroid  disease Maternal Grandmother   . Diabetes Paternal Grandmother   . Alzheimer's disease Paternal Grandmother   . Heart failure Paternal Grandfather   . Hypertension Paternal Grandfather   . Hypertension Father   . Colon cancer Neg Hx      Review of Systems  All other systems reviewed and are negative.   Objective:  Physical Exam Vitals reviewed.  Constitutional:      Appearance: Normal appearance.  HENT:     Head: Normocephalic and atraumatic.  Eyes:     Extraocular Movements: Extraocular movements intact.     Pupils: Pupils are equal, round, and reactive to light.  Cardiovascular:     Rate and Rhythm: Normal rate.     Pulses: Normal pulses.  Pulmonary:     Effort: Pulmonary effort is normal.  Abdominal:     Palpations: Abdomen is soft.  Musculoskeletal:     Cervical back: Normal range of motion.     Right hip: Tenderness and bony tenderness present. Decreased range of motion. Decreased strength.  Neurological:     Mental Status: She is alert and oriented to person, place, and time.  Psychiatric:        Behavior: Behavior normal.     Vital signs in last 24 hours:    Labs:   Estimated body mass index is 25.52 kg/m as calculated from the following:   Height as of 04/11/20: 5' 4.5" (1.638 m).   Weight as of 04/11/20: 68.5 kg.   Imaging Review Plain radiographs demonstrate severe AVN of the right hip.     Assessment/Plan:  Avascular necrosis, right hip(s)  The patient history, physical examination, clinical judgement of the provider and imaging studies are consistent with end stage AVN of the right hip(s) and total hip arthroplasty is deemed medically necessary. The treatment options including medical management, injection therapy, arthroscopy and arthroplasty were discussed at length. The risks and benefits of total hip arthroplasty were presented and reviewed. The risks due to aseptic loosening, infection, stiffness, dislocation/subluxation,  thromboembolic  complications and other imponderables were discussed.  The patient acknowledged the explanation, agreed to proceed with the plan and consent was signed. Patient is  being admitted for inpatient treatment for surgery, pain control, PT, OT, prophylactic antibiotics, VTE prophylaxis, progressive ambulation and ADL's and discharge planning.The patient is planning to be discharged home with home health services

## 2020-04-15 ENCOUNTER — Other Ambulatory Visit: Payer: Self-pay

## 2020-04-15 ENCOUNTER — Ambulatory Visit (HOSPITAL_COMMUNITY): Payer: 59

## 2020-04-15 ENCOUNTER — Ambulatory Visit (HOSPITAL_COMMUNITY): Payer: 59 | Admitting: Physician Assistant

## 2020-04-15 ENCOUNTER — Observation Stay (HOSPITAL_COMMUNITY)
Admission: RE | Admit: 2020-04-15 | Discharge: 2020-04-18 | Disposition: A | Discharge status: Home / Self Care | Payer: 59 | Source: Ambulatory Visit | Attending: Orthopaedic Surgery | Admitting: Orthopaedic Surgery

## 2020-04-15 ENCOUNTER — Encounter (HOSPITAL_COMMUNITY): Payer: Self-pay | Admitting: Orthopaedic Surgery

## 2020-04-15 ENCOUNTER — Ambulatory Visit (HOSPITAL_COMMUNITY): Payer: 59 | Admitting: Anesthesiology

## 2020-04-15 ENCOUNTER — Encounter (HOSPITAL_COMMUNITY)
Admission: RE | Disposition: A | Discharge status: Home / Self Care | Payer: Self-pay | Source: Ambulatory Visit | Attending: Orthopaedic Surgery

## 2020-04-15 DIAGNOSIS — E039 Hypothyroidism, unspecified: Secondary | ICD-10-CM | POA: Diagnosis not present

## 2020-04-15 DIAGNOSIS — Z7982 Long term (current) use of aspirin: Secondary | ICD-10-CM | POA: Insufficient documentation

## 2020-04-15 DIAGNOSIS — M87051 Idiopathic aseptic necrosis of right femur: Secondary | ICD-10-CM | POA: Diagnosis present

## 2020-04-15 DIAGNOSIS — Z87891 Personal history of nicotine dependence: Secondary | ICD-10-CM | POA: Diagnosis not present

## 2020-04-15 DIAGNOSIS — Z79899 Other long term (current) drug therapy: Secondary | ICD-10-CM | POA: Insufficient documentation

## 2020-04-15 DIAGNOSIS — J449 Chronic obstructive pulmonary disease, unspecified: Secondary | ICD-10-CM | POA: Diagnosis not present

## 2020-04-15 DIAGNOSIS — Z7951 Long term (current) use of inhaled steroids: Secondary | ICD-10-CM | POA: Insufficient documentation

## 2020-04-15 DIAGNOSIS — Z96641 Presence of right artificial hip joint: Secondary | ICD-10-CM

## 2020-04-15 DIAGNOSIS — S72041A Displaced fracture of base of neck of right femur, initial encounter for closed fracture: Secondary | ICD-10-CM

## 2020-04-15 DIAGNOSIS — J45909 Unspecified asthma, uncomplicated: Secondary | ICD-10-CM | POA: Diagnosis not present

## 2020-04-15 HISTORY — PX: TOTAL HIP ARTHROPLASTY: SHX124

## 2020-04-15 LAB — GLUCOSE, CAPILLARY
Glucose-Capillary: 148 mg/dL — ABNORMAL HIGH (ref 70–99)
Glucose-Capillary: 145 mg/dL — ABNORMAL HIGH (ref 70–99)

## 2020-04-15 SURGERY — ARTHROPLASTY, HIP, TOTAL, ANTERIOR APPROACH
Anesthesia: Spinal | Site: Hip | Laterality: Right

## 2020-04-15 MED ORDER — PROPOFOL 500 MG/50ML IV EMUL
INTRAVENOUS | Status: DC | PRN
  Administered 2020-04-15: 50 ug/kg/min via INTRAVENOUS

## 2020-04-15 MED ORDER — PREDNISONE 20 MG PO TABS
20.0000 mg | ORAL_TABLET | Freq: Every day | ORAL | Status: DC
  Administered 2020-04-16 – 2020-04-18 (×3): 20 mg via ORAL
  Filled 2020-04-15 (×3): qty 1

## 2020-04-15 MED ORDER — PROPOFOL 10 MG/ML IV BOLUS
INTRAVENOUS | Status: DC | PRN
  Administered 2020-04-15 (×2): 20 mg via INTRAVENOUS

## 2020-04-15 MED ORDER — POVIDONE-IODINE 10 % EX SWAB: 2.0000 "application " | Freq: Once | CUTANEOUS | Status: DC

## 2020-04-15 MED ORDER — LACTATED RINGERS IV SOLN: INTRAVENOUS | Status: DC

## 2020-04-15 MED ORDER — FENTANYL CITRATE (PF) 250 MCG/5ML IJ SOLN
INTRAMUSCULAR | Status: AC
  Filled 2020-04-15: qty 5

## 2020-04-15 MED ORDER — OXYCODONE HCL 5 MG PO TABS
10.0000 mg | ORAL_TABLET | ORAL | Status: DC | PRN
  Administered 2020-04-15 – 2020-04-18 (×5): 10 mg via ORAL
  Filled 2020-04-15 (×9): qty 2

## 2020-04-15 MED ORDER — HYDROMORPHONE HCL 1 MG/ML IJ SOLN
0.5000 mg | INTRAMUSCULAR | Status: DC | PRN
  Administered 2020-04-15: 1 mg via INTRAVENOUS

## 2020-04-15 MED ORDER — ORAL CARE MOUTH RINSE: 15.0000 mL | Freq: Once | OROMUCOSAL | Status: AC

## 2020-04-15 MED ORDER — ACETAMINOPHEN 325 MG PO TABS
325.0000 mg | ORAL_TABLET | Freq: Four times a day (QID) | ORAL | Status: DC | PRN
  Administered 2020-04-17 (×3): 650 mg via ORAL
  Filled 2020-04-15 (×3): qty 2

## 2020-04-15 MED ORDER — ONDANSETRON HCL 4 MG/2ML IJ SOLN: 4.0000 mg | Freq: Once | INTRAMUSCULAR | Status: DC | PRN

## 2020-04-15 MED ORDER — ONDANSETRON HCL 4 MG/2ML IJ SOLN
INTRAMUSCULAR | Status: DC | PRN
  Administered 2020-04-15: 4 mg via INTRAVENOUS

## 2020-04-15 MED ORDER — MENTHOL 3 MG MT LOZG: 1.0000 | LOZENGE | OROMUCOSAL | Status: DC | PRN

## 2020-04-15 MED ORDER — FENTANYL CITRATE (PF) 100 MCG/2ML IJ SOLN
25.0000 ug | INTRAMUSCULAR | Status: DC | PRN
  Administered 2020-04-15 (×2): 50 ug via INTRAVENOUS

## 2020-04-15 MED ORDER — 0.9 % SODIUM CHLORIDE (POUR BTL) OPTIME
TOPICAL | Status: DC | PRN
  Administered 2020-04-15: 1000 mL

## 2020-04-15 MED ORDER — ONDANSETRON HCL 4 MG PO TABS
4.0000 mg | ORAL_TABLET | Freq: Four times a day (QID) | ORAL | Status: DC | PRN
  Filled 2020-04-15: qty 1

## 2020-04-15 MED ORDER — CHLORHEXIDINE GLUCONATE 0.12 % MT SOLN
15.0000 mL | Freq: Once | OROMUCOSAL | Status: AC
  Administered 2020-04-15: 15 mL via OROMUCOSAL

## 2020-04-15 MED ORDER — PRAVASTATIN SODIUM 20 MG PO TABS
40.0000 mg | ORAL_TABLET | Freq: Every day | ORAL | Status: DC
  Administered 2020-04-15 – 2020-04-17 (×3): 40 mg via ORAL
  Filled 2020-04-15 (×3): qty 2

## 2020-04-15 MED ORDER — ALBUTEROL SULFATE (2.5 MG/3ML) 0.083% IN NEBU: 2.5000 mg | INHALATION_SOLUTION | RESPIRATORY_TRACT | Status: DC | PRN

## 2020-04-15 MED ORDER — ALBUTEROL SULFATE HFA 108 (90 BASE) MCG/ACT IN AERS: 2.0000 | INHALATION_SPRAY | RESPIRATORY_TRACT | Status: DC | PRN

## 2020-04-15 MED ORDER — OXYCODONE-ACETAMINOPHEN 5-325 MG PO TABS
ORAL_TABLET | ORAL | Status: AC
  Administered 2020-04-15: 1
  Filled 2020-04-15: qty 1

## 2020-04-15 MED ORDER — FENTANYL CITRATE (PF) 100 MCG/2ML IJ SOLN
INTRAMUSCULAR | Status: AC
  Filled 2020-04-15: qty 2

## 2020-04-15 MED ORDER — ALBUTEROL SULFATE (2.5 MG/3ML) 0.083% IN NEBU
2.5000 mg | INHALATION_SOLUTION | Freq: Four times a day (QID) | RESPIRATORY_TRACT | Status: DC | PRN
  Administered 2020-04-15: 2.5 mg via RESPIRATORY_TRACT

## 2020-04-15 MED ORDER — APIXABAN 2.5 MG PO TABS: 2.5000 mg | ORAL_TABLET | Freq: Two times a day (BID) | ORAL | 0 refills | Status: DC

## 2020-04-15 MED ORDER — CITALOPRAM HYDROBROMIDE 20 MG PO TABS
40.0000 mg | ORAL_TABLET | Freq: Every day | ORAL | Status: DC
  Administered 2020-04-15 – 2020-04-17 (×3): 40 mg via ORAL
  Filled 2020-04-15 (×3): qty 2

## 2020-04-15 MED ORDER — PHENYLEPHRINE HCL-NACL 10-0.9 MG/250ML-% IV SOLN
INTRAVENOUS | Status: DC | PRN
  Administered 2020-04-15: 25 ug/min via INTRAVENOUS

## 2020-04-15 MED ORDER — DEXAMETHASONE SODIUM PHOSPHATE 10 MG/ML IJ SOLN
INTRAMUSCULAR | Status: DC | PRN
  Administered 2020-04-15: 5 mg via INTRAVENOUS

## 2020-04-15 MED ORDER — MIDAZOLAM HCL 2 MG/2ML IJ SOLN
INTRAMUSCULAR | Status: DC | PRN
  Administered 2020-04-15 (×2): 1 mg via INTRAVENOUS

## 2020-04-15 MED ORDER — STERILE WATER FOR IRRIGATION IR SOLN
Status: DC | PRN
  Administered 2020-04-15: 2000 mL

## 2020-04-15 MED ORDER — METOCLOPRAMIDE HCL 5 MG/ML IJ SOLN: 5.0000 mg | Freq: Three times a day (TID) | INTRAMUSCULAR | Status: DC | PRN

## 2020-04-15 MED ORDER — ACETAMINOPHEN 500 MG PO TABS
1000.0000 mg | ORAL_TABLET | Freq: Once | ORAL | Status: AC
  Administered 2020-04-15: 1000 mg via ORAL
  Filled 2020-04-15: qty 2

## 2020-04-15 MED ORDER — LORAZEPAM 1 MG PO TABS
1.0000 mg | ORAL_TABLET | Freq: Every day | ORAL | Status: DC
  Administered 2020-04-15 – 2020-04-17 (×3): 1 mg via ORAL
  Filled 2020-04-15 (×3): qty 1

## 2020-04-15 MED ORDER — MIDAZOLAM HCL 2 MG/2ML IJ SOLN
INTRAMUSCULAR | Status: AC
  Filled 2020-04-15: qty 2

## 2020-04-15 MED ORDER — GABAPENTIN 100 MG PO CAPS
100.0000 mg | ORAL_CAPSULE | Freq: Three times a day (TID) | ORAL | Status: DC
  Administered 2020-04-15 – 2020-04-16 (×4): 100 mg via ORAL
  Filled 2020-04-15 (×6): qty 1

## 2020-04-15 MED ORDER — SODIUM CHLORIDE 0.9 % IV SOLN: INTRAVENOUS | Status: DC

## 2020-04-15 MED ORDER — MONTELUKAST SODIUM 10 MG PO TABS
10.0000 mg | ORAL_TABLET | Freq: Every day | ORAL | Status: DC
  Administered 2020-04-15 – 2020-04-17 (×3): 10 mg via ORAL
  Filled 2020-04-15 (×3): qty 1

## 2020-04-15 MED ORDER — CALCIUM CARBONATE ANTACID 500 MG PO CHEW: 2.0000 | CHEWABLE_TABLET | Freq: Three times a day (TID) | ORAL | Status: DC | PRN

## 2020-04-15 MED ORDER — TRANEXAMIC ACID-NACL 1000-0.7 MG/100ML-% IV SOLN
INTRAVENOUS | Status: AC
  Filled 2020-04-15: qty 100

## 2020-04-15 MED ORDER — CLINDAMYCIN PHOSPHATE 600 MG/50ML IV SOLN
600.0000 mg | Freq: Four times a day (QID) | INTRAVENOUS | Status: DC
  Administered 2020-04-16: 600 mg via INTRAVENOUS
  Filled 2020-04-15 (×2): qty 50

## 2020-04-15 MED ORDER — ONDANSETRON HCL 4 MG/2ML IJ SOLN: 4.0000 mg | Freq: Four times a day (QID) | INTRAMUSCULAR | Status: DC | PRN

## 2020-04-15 MED ORDER — POLYETHYLENE GLYCOL 3350 17 G PO PACK: 17.0000 g | PACK | Freq: Every day | ORAL | Status: DC | PRN

## 2020-04-15 MED ORDER — TRANEXAMIC ACID-NACL 1000-0.7 MG/100ML-% IV SOLN
INTRAVENOUS | Status: DC | PRN
  Administered 2020-04-15: 1000 mg via INTRAVENOUS

## 2020-04-15 MED ORDER — CLINDAMYCIN PHOSPHATE 600 MG/50ML IV SOLN: 600.0000 mg | Freq: Four times a day (QID) | INTRAVENOUS | Status: DC

## 2020-04-15 MED ORDER — OXYCODONE HCL 5 MG PO TABS
5.0000 mg | ORAL_TABLET | ORAL | Status: DC | PRN
  Administered 2020-04-16 (×4): 10 mg via ORAL
  Administered 2020-04-16: 5 mg via ORAL
  Administered 2020-04-17 – 2020-04-18 (×4): 10 mg via ORAL
  Filled 2020-04-15 (×7): qty 2

## 2020-04-15 MED ORDER — ALBUTEROL SULFATE (2.5 MG/3ML) 0.083% IN NEBU
INHALATION_SOLUTION | RESPIRATORY_TRACT | Status: AC
  Filled 2020-04-15: qty 3

## 2020-04-15 MED ORDER — FENTANYL CITRATE (PF) 250 MCG/5ML IJ SOLN
INTRAMUSCULAR | Status: DC | PRN
  Administered 2020-04-15: 50 ug via INTRAVENOUS

## 2020-04-15 MED ORDER — ALUM & MAG HYDROXIDE-SIMETH 200-200-20 MG/5ML PO SUSP: 30.0000 mL | ORAL | Status: DC | PRN

## 2020-04-15 MED ORDER — DOCUSATE SODIUM 100 MG PO CAPS
100.0000 mg | ORAL_CAPSULE | Freq: Two times a day (BID) | ORAL | Status: DC
  Administered 2020-04-15 – 2020-04-17 (×5): 100 mg via ORAL
  Filled 2020-04-15 (×5): qty 1

## 2020-04-15 MED ORDER — METHOCARBAMOL 500 MG IVPB - SIMPLE MED
500.0000 mg | Freq: Four times a day (QID) | INTRAVENOUS | Status: DC | PRN
  Filled 2020-04-15: qty 50

## 2020-04-15 MED ORDER — METHOCARBAMOL 500 MG PO TABS
500.0000 mg | ORAL_TABLET | Freq: Four times a day (QID) | ORAL | Status: DC | PRN
  Administered 2020-04-16 – 2020-04-18 (×8): 500 mg via ORAL
  Filled 2020-04-15 (×8): qty 1

## 2020-04-15 MED ORDER — OXYCODONE HCL 5 MG PO TABS: 5.0000 mg | ORAL_TABLET | Freq: Four times a day (QID) | ORAL | 0 refills | Status: DC | PRN

## 2020-04-15 MED ORDER — CLINDAMYCIN PHOSPHATE 900 MG/50ML IV SOLN
900.0000 mg | INTRAVENOUS | Status: AC
  Administered 2020-04-15: 900 mg via INTRAVENOUS
  Filled 2020-04-15: qty 50

## 2020-04-15 MED ORDER — HYDROMORPHONE HCL 1 MG/ML IJ SOLN
INTRAMUSCULAR | Status: AC
  Filled 2020-04-15: qty 1

## 2020-04-15 MED ORDER — SODIUM CHLORIDE 0.9 % IR SOLN
Status: DC | PRN
  Administered 2020-04-15: 1000 mL

## 2020-04-15 MED ORDER — METOCLOPRAMIDE HCL 5 MG PO TABS
5.0000 mg | ORAL_TABLET | Freq: Three times a day (TID) | ORAL | Status: DC | PRN
  Filled 2020-04-15: qty 2

## 2020-04-15 MED ORDER — PANTOPRAZOLE SODIUM 40 MG PO TBEC
40.0000 mg | DELAYED_RELEASE_TABLET | Freq: Two times a day (BID) | ORAL | Status: DC
  Administered 2020-04-15 – 2020-04-18 (×6): 40 mg via ORAL
  Filled 2020-04-15 (×6): qty 1

## 2020-04-15 MED ORDER — APIXABAN 2.5 MG PO TABS
2.5000 mg | ORAL_TABLET | Freq: Two times a day (BID) | ORAL | Status: DC
  Administered 2020-04-16 – 2020-04-18 (×5): 2.5 mg via ORAL
  Filled 2020-04-15 (×5): qty 1

## 2020-04-15 MED ORDER — LIDOCAINE 2% (20 MG/ML) 5 ML SYRINGE
INTRAMUSCULAR | Status: DC | PRN
  Administered 2020-04-15: 20 mg via INTRAVENOUS

## 2020-04-15 MED ORDER — CLINDAMYCIN PHOSPHATE 600 MG/50ML IV SOLN
600.0000 mg | Freq: Four times a day (QID) | INTRAVENOUS | Status: AC
  Administered 2020-04-15: 600 mg via INTRAVENOUS
  Filled 2020-04-15 (×2): qty 50

## 2020-04-15 MED ORDER — DIPHENHYDRAMINE HCL 12.5 MG/5ML PO ELIX: 12.5000 mg | ORAL_SOLUTION | ORAL | Status: DC | PRN

## 2020-04-15 MED ORDER — LEVOTHYROXINE SODIUM 50 MCG PO TABS
50.0000 ug | ORAL_TABLET | Freq: Every day | ORAL | Status: DC
  Administered 2020-04-16 – 2020-04-18 (×3): 50 ug via ORAL
  Filled 2020-04-15 (×3): qty 1

## 2020-04-15 MED ORDER — LORAZEPAM 1 MG PO TABS
1.0000 mg | ORAL_TABLET | Freq: Every day | ORAL | Status: DC | PRN
  Administered 2020-04-16: 1 mg via ORAL
  Filled 2020-04-15: qty 1

## 2020-04-15 MED ORDER — PHENOL 1.4 % MT LIQD: 1.0000 | OROMUCOSAL | Status: DC | PRN

## 2020-04-15 MED ORDER — PHENYLEPHRINE 40 MCG/ML (10ML) SYRINGE FOR IV PUSH (FOR BLOOD PRESSURE SUPPORT)
PREFILLED_SYRINGE | INTRAVENOUS | Status: DC | PRN
  Administered 2020-04-15: 25 ug via INTRAVENOUS

## 2020-04-15 MED ORDER — BUPIVACAINE IN DEXTROSE 0.75-8.25 % IT SOLN
INTRATHECAL | Status: DC | PRN
  Administered 2020-04-15: 1.6 mL via INTRATHECAL

## 2020-04-15 SURGICAL SUPPLY — 41 items
APL SKNCLS STERI-STRIP NONHPOA (GAUZE/BANDAGES/DRESSINGS)
BAG SPEC THK2 15X12 ZIP CLS (MISCELLANEOUS)
BAG ZIPLOCK 12X15 (MISCELLANEOUS) IMPLANT
BENZOIN TINCTURE PRP APPL 2/3 (GAUZE/BANDAGES/DRESSINGS) IMPLANT
BLADE SAW SGTL 18X1.27X75 (BLADE) ×2 IMPLANT
BLADE SAW SGTL 18X1.27X75MM (BLADE) ×1
CLOSURE WOUND 1/2 X4 (GAUZE/BANDAGES/DRESSINGS)
COVER PERINEAL POST (MISCELLANEOUS) ×3 IMPLANT
COVER SURGICAL LIGHT HANDLE (MISCELLANEOUS) ×3 IMPLANT
COVER WAND RF STERILE (DRAPES) ×3 IMPLANT
CUP SECTOR GRIPTON 50MM (Cup) ×2 IMPLANT
DRAPE STERI IOBAN 125X83 (DRAPES) ×3 IMPLANT
DRAPE U-SHAPE 47X51 STRL (DRAPES) ×6 IMPLANT
DRSG AQUACEL AG ADV 3.5X10 (GAUZE/BANDAGES/DRESSINGS) ×3 IMPLANT
DURAPREP 26ML APPLICATOR (WOUND CARE) ×3 IMPLANT
ELECT REM PT RETURN 15FT ADLT (MISCELLANEOUS) ×3 IMPLANT
GAUZE XEROFORM 1X8 LF (GAUZE/BANDAGES/DRESSINGS) ×3 IMPLANT
GLOVE BIO SURGEON STRL SZ7.5 (GLOVE) ×3 IMPLANT
GLOVE ECLIPSE 8.0 STRL XLNG CF (GLOVE) ×3 IMPLANT
GLOVE SRG 8 PF TXTR STRL LF DI (GLOVE) ×2 IMPLANT
GLOVE SURG UNDER POLY LF SZ8 (GLOVE) ×6
GOWN STRL REUS W/TWL XL LVL3 (GOWN DISPOSABLE) ×6 IMPLANT
HANDPIECE INTERPULSE COAX TIP (DISPOSABLE) ×3
HEAD FEM STD 32X+1 STRL (Hips) ×2 IMPLANT
HOLDER FOLEY CATH W/STRAP (MISCELLANEOUS) ×3 IMPLANT
KIT TURNOVER KIT A (KITS) IMPLANT
LINER ACETABULAR 32X50 (Liner) ×2 IMPLANT
PACK ANTERIOR HIP CUSTOM (KITS) ×3 IMPLANT
PENCIL SMOKE EVACUATOR (MISCELLANEOUS) IMPLANT
SET HNDPC FAN SPRY TIP SCT (DISPOSABLE) ×1 IMPLANT
STAPLER VISISTAT 35W (STAPLE) ×2 IMPLANT
STEM CORAIL KA10 (Stem) ×2 IMPLANT
STRIP CLOSURE SKIN 1/2X4 (GAUZE/BANDAGES/DRESSINGS) IMPLANT
SUT ETHIBOND NAB CT1 #1 30IN (SUTURE) ×3 IMPLANT
SUT ETHILON 2 0 PS N (SUTURE) IMPLANT
SUT MNCRL AB 4-0 PS2 18 (SUTURE) IMPLANT
SUT VIC AB 0 CT1 36 (SUTURE) ×3 IMPLANT
SUT VIC AB 1 CT1 36 (SUTURE) ×3 IMPLANT
SUT VIC AB 2-0 CT1 27 (SUTURE) ×6
SUT VIC AB 2-0 CT1 TAPERPNT 27 (SUTURE) ×2 IMPLANT
TRAY FOLEY MTR SLVR 16FR STAT (SET/KITS/TRAYS/PACK) IMPLANT

## 2020-04-15 NOTE — Op Note (Signed)
NAME: [name], GUION MEDICAL RECORD [id number] ACCOUNT [id number] DATE OF BIRTH:January 24, 1966 FACILITY: [facility] LOCATION: [id number] PHYSICIAN:Karmello Abercrombie Aretha Parrot, MD  OPERATIVE REPORT  DATE OF PROCEDURE:  04/15/2020  PREOPERATIVE DIAGNOSIS:  Avascular necrosis, right hip.  POSTOPERATIVE DIAGNOSIS:  Avascular necrosis, right hip.  PROCEDURE:  Right total hip arthroplasty through direct anterior approach.  IMPLANTS:  DePuy Sector Gription acetabular component size 50, size 32+0 neutral polyethylene liner, size 10 Corail femoral component with standard offset, size 32+1 metal hip ball.  SURGEON:  Vanita Panda. Magnus Ivan, MD  ASSISTANT:  Richardean Canal, PA-C.  ANESTHESIA:  Spinal.  ANTIBIOTICS:  900 mg IV clindamycin.  ESTIMATED BLOOD LOSS:  200 mL.  COMPLICATIONS:  None.  INDICATIONS:  The patient is a 55 year old female who unfortunately has avascular necrosis involving both her hips.  Her right hip hurts much more significant than her left.  This has had a rapid onset and has gotten worse.  It may be steroid related.   At this point, her pain is severe and daily and the MRI does confirm severe avascular necrosis and we have recommended total hip arthroplasty.  We had a long and thorough discussion about the risk of acute blood loss anemia, nerve or vessel injury,  fracture, infection, dislocation, DVT and implant failure as well as skin and soft tissue issues.  We talked about the goals being decreased pain, improve mobility and overall improve quality of life.  DESCRIPTION OF PROCEDURE:  After informed consent was obtained and appropriate right hip was marked, she was brought to the operating room, sat up on a stretcher where spinal anesthesia was obtained.  She was then laid in supine position on a stretcher.   Foley catheter was placed and both feet had traction boots applied to them.  Next, she was placed supine on the Hana fracture table, the perineal post in place and  both legs in line skeletal traction device and no traction applied.  The right operative  hip was prepped and draped with DuraPrep and sterile drapes.  A time-out was called to identify correct patient, correct right hip.  I then made an incision just inferior and posterior to the anterior superior iliac spine and carried this obliquely down  the leg.  We dissected down tensor fascia lata muscle.  Tensor fascia was then divided longitudinally to proceed with direct anterior approach to the hip.  We identified and cauterized circumflex vessels and identified the hip capsule, I opened the hip  capsule in an L-type format finding a moderate joint effusion.  We placed curved retractors around the medial and lateral femoral neck and made our femoral neck cut with an oscillating saw just proximal to the lesser trochanter and completed this with an  osteotome.  I placed a corkscrew guide in the femoral head and removed the femoral head in its entirety.  There was no osteoarthritis of the hip, but the cartilage was flaking consistent with avascular necrosis.  I then placed a bent Hohmann over the  medial acetabular rim and removed remnants of the acetabular labrum and other debris.  I then began reaming under direct visualization from a size 44 reamer in stepwise increments up to a size 49 with all reamers placed under direct visualization, the  last reamer was placed under direct fluoroscopy so I obtained my depth of reaming by inclination and anteversion.  I then placed the real DePuy Sector Gription acetabular component size 50 and a 32+0 neutral polyethylene liner for that size acetabular  component.  Attention was then turned to the femur.  With the leg externally rotated 120 degrees, extended and adducted, I was able to place a Mueller retractor medially and Hohman retractor behind the greater trochanter.  I released lateral joint  capsule and used a box-cutting osteotome to enter the femoral canal and a  rongeur to lateralize, then began broaching using the Corail broaching system from a size 8 to a size 10.  I then trialed a standard offset femoral neck and a size 10 broach and a  32+1 trial hip ball, reduced this in the acetabulum.  We were pleased with the leg length, offset, range of motion and stability assessed radiographically and mechanically.  I then dislocated the hip and removed the trial components.  We placed the real  Corail femoral component from DePuy size 10 with standard offset and the real 32+1 metal hip ball and again reduced this in the acetabulum and it was stable.  We then irrigated the soft tissue with normal saline solution using pulsatile lavage.  I was  able to reapproximate the joint capsule with interrupted #1 Ethibond suture.  #1 Vicryl suture was used to close the tensor fascia.  0 Vicryl was used to close the deep tissue and 2-0 Vicryl was used to close the subcutaneous tissue.  The skin was closed  with staples.  An Aquacel dressing was applied.  She was taken off the Hana table and taken to recovery room in stable condition.  All final counts being correct.  No complications noted.  Of note Rexene Edison, PA-C, assisted during the entire case and  assistance was crucial for facilitating all aspects of this case.  HN/NUANCE  D:04/15/2020 T:04/15/2020 JOB:[id number]

## 2020-04-15 NOTE — Interval H&P Note (Signed)
History and Physical Interval Note: The patient understands that she is here today for a right total hip arthroplasty to treat the avascular necrosis involving her right hip.  The risk and benefits of surgery have been explained in detail and informed consent is obtained.  The right hip has been marked.  There has been no acute interval change in her medical status.  See recent H&P.  04/15/2020 11:18 AM  [name]  has presented today for surgery, with the diagnosis of Avascular Necrosis Right Hip.  The various methods of treatment have been discussed with the patient and family. After consideration of risks, benefits and other options for treatment, the patient has consented to  Procedure(s): RIGHT TOTAL HIP ARTHROPLASTY ANTERIOR APPROACH (Right) as a surgical intervention.  The patient's history has been reviewed, patient examined, no change in status, stable for surgery.  I have reviewed the patient's chart and labs.  Questions were answered to the patient's satisfaction.     Kathryne Hitch

## 2020-04-15 NOTE — Discharge Instructions (Addendum)
Information on my medicine - ELIQUIS (apixaban)  Why was Eliquis prescribed for you? Eliquis was prescribed for you to reduce the risk of blood clots forming after orthopedic surgery.    What do You need to know about Eliquis? Take your Eliquis TWICE DAILY - one tablet in the morning and one tablet in the evening with or without food.  It would be best to take the dose about the same time each day.  If you have difficulty swallowing the tablet whole please discuss with your pharmacist how to take the medication safely.  Take Eliquis exactly as prescribed by your doctor and DO NOT stop taking Eliquis without talking to the doctor who prescribed the medication.  Stopping without other medication to take the place of Eliquis may increase your risk of developing a clot.  After discharge, you should have regular check-up appointments with your healthcare provider that is prescribing your Eliquis.  What do you do if you miss a dose? If a dose of ELIQUIS is not taken at the scheduled time, take it as soon as possible on the same day and twice-daily administration should be resumed.  The dose should not be doubled to make up for a missed dose.  Do not take more than one tablet of ELIQUIS at the same time.  Important Safety Information A possible side effect of Eliquis is bleeding. You should call your healthcare provider right away if you experience any of the following: ? Bleeding from an injury or your nose that does not stop. ? Unusual colored urine (red or dark brown) or unusual colored stools (red or black). ? Unusual bruising for unknown reasons. ? A serious fall or if you hit your head (even if there is no bleeding).  Some medicines may interact with Eliquis and might increase your risk of bleeding or clotting while on Eliquis. To help avoid this, consult your healthcare provider or pharmacist prior to using any new prescription or non-prescription medications, including herbals,  vitamins, non-steroidal anti-inflammatory drugs (NSAIDs) and supplements.  This website has more information on Eliquis (apixaban): http://www.eliquis.com/eliquis/home INSTRUCTIONS AFTER JOINT REPLACEMENT   o Remove items at home which could result in a fall. This includes throw rugs or furniture in walking pathways o ICE to the affected joint every three hours while awake for 30 minutes at a time, for at least the first 3-5 days, and then as needed for pain and swelling.  Continue to use ice for pain and swelling. You may notice swelling that will progress down to the foot and ankle.  This is normal after surgery.  Elevate your leg when you are not up walking on it.   o Continue to use the breathing machine you got in the hospital (incentive spirometer) which will help keep your temperature down.  It is common for your temperature to cycle up and down following surgery, especially at night when you are not up moving around and exerting yourself.  The breathing machine keeps your lungs expanded and your temperature down.   DIET:  As you were doing prior to hospitalization, we recommend a well-balanced diet.  DRESSING / WOUND CARE / SHOWERING  Keep the surgical dressing until follow up.  The dressing is water proof, so you can shower without any extra covering.  IF THE DRESSING FALLS OFF or the wound gets wet inside, change the dressing with sterile gauze.  Please use good hand washing techniques before changing the dressing.  Do not use any lotions or creams  on the incision until instructed by your surgeon.    ACTIVITY  o Increase activity slowly as tolerated, but follow the weight bearing instructions below.   o No driving for 6 weeks or until further direction given by your physician.  You cannot drive while taking narcotics.  o No lifting or carrying greater than 10 lbs. until further directed by your surgeon. o Avoid periods of inactivity such as sitting longer than an hour when not asleep.  This helps prevent blood clots.  o You may return to work once you are authorized by your doctor.     WEIGHT BEARING   Weight bearing as tolerated with assist device (walker, cane, etc) as directed, use it as long as suggested by your surgeon or therapist, typically at least 4-6 weeks.   EXERCISES  Results after joint replacement surgery are often greatly improved when you follow the exercise, range of motion and muscle strengthening exercises prescribed by your doctor. Safety measures are also important to protect the joint from further injury. Any time any of these exercises cause you to have increased pain or swelling, decrease what you are doing until you are comfortable again and then slowly increase them. If you have problems or questions, call your caregiver or physical therapist for advice.   Rehabilitation is important following a joint replacement. After just a few days of immobilization, the muscles of the leg can become weakened and shrink (atrophy).  These exercises are designed to build up the tone and strength of the thigh and leg muscles and to improve motion. Often times heat used for twenty to thirty minutes before working out will loosen up your tissues and help with improving the range of motion but do not use heat for the first two weeks following surgery (sometimes heat can increase post-operative swelling).   These exercises can be done on a training (exercise) mat, on the floor, on a table or on a bed. Use whatever works the best and is most comfortable for you.    Use music or television while you are exercising so that the exercises are a pleasant break in your day. This will make your life better with the exercises acting as a break in your routine that you can look forward to.   Perform all exercises about fifteen times, three times per day or as directed.  You should exercise both the operative leg and the other leg as well.  Exercises include:   . Quad Sets - Tighten  up the muscle on the front of the thigh (Quad) and hold for 5-10 seconds.   . Straight Leg Raises - With your knee straight (if you were given a brace, keep it on), lift the leg to 60 degrees, hold for 3 seconds, and slowly lower the leg.  Perform this exercise against resistance later as your leg gets stronger.  . Leg Slides: Lying on your back, slowly slide your foot toward your buttocks, bending your knee up off the floor (only go as far as is comfortable). Then slowly slide your foot back down until your leg is flat on the floor again.  Lawanna Kobus Wings: Lying on your back spread your legs to the side as far apart as you can without causing discomfort.  . Hamstring Strength:  Lying on your back, push your heel against the floor with your leg straight by tightening up the muscles of your buttocks.  Repeat, but this time bend your knee to a comfortable angle, and push  your heel against the floor.  You may put a pillow under the heel to make it more comfortable if necessary.   A rehabilitation program following joint replacement surgery can speed recovery and prevent re-injury in the future due to weakened muscles. Contact your doctor or a physical therapist for more information on knee rehabilitation.    CONSTIPATION  Constipation is defined medically as fewer than three stools per week and severe constipation as less than one stool per week.  Even if you have a regular bowel pattern at home, your normal regimen is likely to be disrupted due to multiple reasons following surgery.  Combination of anesthesia, postoperative narcotics, change in appetite and fluid intake all can affect your bowels.   YOU MUST use at least one of the following options; they are listed in order of increasing strength to get the job done.  They are all available over the counter, and you may need to use some, POSSIBLY even all of these options:    Drink plenty of fluids (prune juice may be helpful) and high fiber  foods Colace 100 mg by mouth twice a day  Senokot for constipation as directed and as needed Dulcolax (bisacodyl), take with full glass of water  Miralax (polyethylene glycol) once or twice a day as needed.  If you have tried all these things and are unable to have a bowel movement in the first 3-4 days after surgery call either your surgeon or your primary doctor.    If you experience loose stools or diarrhea, hold the medications until you stool forms back up.  If your symptoms do not get better within 1 week or if they get worse, check with your doctor.  If you experience "the worst abdominal pain ever" or develop nausea or vomiting, please contact the office immediately for further recommendations for treatment.   ITCHING:  If you experience itching with your medications, try taking only a single pain pill, or even half a pain pill at a time.  You can also use Benadryl over the counter for itching or also to help with sleep.   TED HOSE STOCKINGS:  Use stockings on both legs until for at least 2 weeks or as directed by physician office. They may be removed at night for sleeping.  MEDICATIONS:  See your medication summary on the "After Visit Summary" that nursing will review with you.  You may have some home medications which will be placed on hold until you complete the course of blood thinner medication.  It is important for you to complete the blood thinner medication as prescribed.  PRECAUTIONS:  If you experience chest pain or shortness of breath - call 911 immediately for transfer to the hospital emergency department.   If you develop a fever greater that 101 F, purulent drainage from wound, increased redness or drainage from wound, foul odor from the wound/dressing, or calf pain - CONTACT YOUR SURGEON.                                                   FOLLOW-UP APPOINTMENTS:  If you do not already have a post-op appointment, please call the office for an appointment to be seen by your  surgeon.  Guidelines for how soon to be seen are listed in your "After Visit Summary", but are typically between 1-4 weeks after  surgery.  OTHER INSTRUCTIONS:   Knee Replacement:  Do not place pillow under knee, focus on keeping the knee straight while resting. CPM instructions: 0-90 degrees, 2 hours in the morning, 2 hours in the afternoon, and 2 hours in the evening. Place foam block, curve side up under heel at all times except when in CPM or when walking.  DO NOT modify, tear, cut, or change the foam block in any way.   DENTAL ANTIBIOTICS:  In most cases prophylactic antibiotics for Dental procdeures after total joint surgery are not necessary.  Exceptions are as follows:  1. History of prior total joint infection  2. Severely immunocompromised (Organ Transplant, cancer chemotherapy, Rheumatoid biologic meds such as Humera)  3. Poorly controlled diabetes (A1C &gt; 8.0, blood glucose over 200)  If you have one of these conditions, contact your surgeon for an antibiotic prescription, prior to your dental procedure.   MAKE SURE YOU:  . Understand these instructions.  . Get help right away if you are not doing well or get worse.    Thank you for letting us be a part of your medical care team.  It is a privilege we respect greatly.  We hope these instructions will help you stay on track for a fast and full recovery!

## 2020-04-15 NOTE — Anesthesia Procedure Notes (Signed)
Spinal  Patient location during procedure: OR Start time: 04/15/2020 12:37 PM End time: 04/15/2020 12:40 PM Staffing Performed: anesthesiologist  Anesthesiologist: Cecile Hearing, MD Preanesthetic Checklist Completed: patient identified, IV checked, risks and benefits discussed, surgical consent, monitors and equipment checked, pre-op evaluation and timeout performed Spinal Block Patient position: sitting Prep: DuraPrep and site prepped and draped Patient monitoring: continuous pulse ox and blood pressure Approach: midline Location: L3-4 Injection technique: single-shot Needle Needle type: Pencan  Needle gauge: 24 G Additional Notes Functioning IV was confirmed and monitors were applied. Sterile prep and drape, including hand hygiene, mask and sterile gloves were used. The patient was positioned and the spine was prepped. The skin was anesthetized with lidocaine.  Free flow of clear CSF was obtained prior to injecting local anesthetic into the CSF.  The spinal needle aspirated freely following injection.  The needle was carefully withdrawn.  The patient tolerated the procedure well. Consent was obtained prior to procedure with all questions answered and concerns addressed. Risks including but not limited to bleeding, infection, nerve damage, paralysis, failed block, inadequate analgesia, allergic reaction, high spinal, itching and headache were discussed and the patient wished to proceed.   Arrie Aran, MD

## 2020-04-15 NOTE — Plan of Care (Signed)

## 2020-04-15 NOTE — Anesthesia Procedure Notes (Signed)
Date/Time: 04/15/2020 12:33 PM Performed by: Minerva Ends, CRNA Pre-anesthesia Checklist: Patient identified, Emergency Drugs available, Patient being monitored, Timeout performed and Suction available Patient Re-evaluated:Patient Re-evaluated prior to induction Oxygen Delivery Method: Simple face mask Placement Confirmation: positive ETCO2 and breath sounds checked- equal and bilateral Dental Injury: Teeth and Oropharynx as per pre-operative assessment

## 2020-04-15 NOTE — Transfer of Care (Signed)
Immediate Anesthesia Transfer of Care Note  Patient: [name]  Procedure(s) Performed: RIGHT TOTAL HIP ARTHROPLASTY ANTERIOR APPROACH (Right Hip)  Patient Location: PACU  Anesthesia Type:Spinal  Level of Consciousness: awake and alert   Airway & Oxygen Therapy: Patient Spontanous Breathing and Patient connected to face mask oxygen  Post-op Assessment: Report given to RN and Post -op Vital signs reviewed and stable  Post vital signs: Reviewed and stable  Last Vitals:  Vitals Value Taken Time  BP 113/74 04/15/20 1406  Temp    Pulse 92 04/15/20 1408  Resp 12 04/15/20 1408  SpO2 100 % 04/15/20 1408  Vitals shown include unvalidated device data.  Last Pain:  Vitals:   04/15/20 1038  TempSrc:   PainSc: 3       Patients Stated Pain Goal: 3 (04/15/20 1038)  Complications: No complications documented.

## 2020-04-15 NOTE — Brief Op Note (Signed)
04/15/2020  1:50 PM  PATIENT:  Loreta Ave  55 y.o. female  PRE-OPERATIVE DIAGNOSIS:  Avascular Necrosis Right Hip  POST-OPERATIVE DIAGNOSIS:  Avascular Necrosis Right Hip  PROCEDURE:  Procedure(s): RIGHT TOTAL HIP ARTHROPLASTY ANTERIOR APPROACH (Right)  SURGEON:  Surgeon(s) and Role:    Kathryne Hitch, MD - Primary  PHYSICIAN ASSISTANT:  Rexene Edison, PA-C  ANESTHESIA:   spinal  EBL:  200 mL   COUNTS:  YES  DICTATION: .Other Dictation: Dictation Number [id number]  PLAN OF CARE: Admit for overnight observation  PATIENT DISPOSITION:  PACU - hemodynamically stable.   Delay start of Pharmacological VTE agent (>24hrs) due to surgical blood loss or risk of bleeding: no

## 2020-04-15 NOTE — Anesthesia Postprocedure Evaluation (Signed)
Anesthesia Post Note  Patient: [name]  Procedure(s) Performed: RIGHT TOTAL HIP ARTHROPLASTY ANTERIOR APPROACH (Right Hip)     Patient location during evaluation: PACU Anesthesia Type: Spinal Level of consciousness: oriented and awake and alert Pain management: pain level controlled Vital Signs Assessment: post-procedure vital signs reviewed and stable Respiratory status: spontaneous breathing and respiratory function stable Cardiovascular status: blood pressure returned to baseline and stable Postop Assessment: no headache, no backache, no apparent nausea or vomiting and patient able to bend at knees Anesthetic complications: no   No complications documented.  Last Vitals:  Vitals:   04/15/20 1545 04/15/20 1600  BP: 114/65 (!) 79/70  Pulse: (!) 109 99  Resp: 19 20  Temp:  36.7 C  SpO2: 100% 100%    Last Pain:  Vitals:   04/15/20 1600  TempSrc:   PainSc: 0-No pain                 Mellody Dance

## 2020-04-16 DIAGNOSIS — M87051 Idiopathic aseptic necrosis of right femur: Secondary | ICD-10-CM | POA: Diagnosis not present

## 2020-04-16 LAB — CBC
HCT: 28.4 % — ABNORMAL LOW (ref 36.0–46.0)
Hemoglobin: 8.8 g/dL — ABNORMAL LOW (ref 12.0–15.0)
MCH: 30 pg (ref 26.0–34.0)
MCHC: 31 g/dL (ref 30.0–36.0)
MCV: 96.9 fL (ref 80.0–100.0)
Platelets: 248 10*3/uL (ref 150–400)
RBC: 2.93 MIL/uL — ABNORMAL LOW (ref 3.87–5.11)
RDW: 13 % (ref 11.5–15.5)
WBC: 15.6 10*3/uL — ABNORMAL HIGH (ref 4.0–10.5)
nRBC: 0 % (ref 0.0–0.2)

## 2020-04-16 LAB — BASIC METABOLIC PANEL
Anion gap: 8 (ref 5–15)
BUN: 12 mg/dL (ref 6–20)
CO2: 27 mmol/L (ref 22–32)
Calcium: 9 mg/dL (ref 8.9–10.3)
Chloride: 101 mmol/L (ref 98–111)
Creatinine, Ser: 0.75 mg/dL (ref 0.44–1.00)
GFR, Estimated: >60 mL/min (ref >60)
Glucose, Bld: 143 mg/dL — ABNORMAL HIGH (ref 70–99)
Potassium: 4.2 mmol/L (ref 3.5–5.1)
Sodium: 136 mmol/L (ref 135–145)

## 2020-04-16 LAB — GLUCOSE, CAPILLARY: Glucose-Capillary: 122 mg/dL — ABNORMAL HIGH (ref 70–99)

## 2020-04-16 MED ORDER — UMECLIDINIUM BROMIDE 62.5 MCG/INH IN AEPB
1.0000 | INHALATION_SPRAY | Freq: Every day | RESPIRATORY_TRACT | Status: DC
  Filled 2020-04-16: qty 7

## 2020-04-16 MED ORDER — NON FORMULARY: 2.5000 ug | Freq: Every day | Status: DC

## 2020-04-16 MED ORDER — UMECLIDINIUM BROMIDE 62.5 MCG/INH IN AEPB: 1.0000 | INHALATION_SPRAY | Freq: Every day | RESPIRATORY_TRACT | Status: DC

## 2020-04-16 MED ORDER — ARFORMOTEROL TARTRATE 15 MCG/2ML IN NEBU
15.0000 ug | INHALATION_SOLUTION | Freq: Two times a day (BID) | RESPIRATORY_TRACT | Status: DC
  Administered 2020-04-16: 15 ug via RESPIRATORY_TRACT
  Filled 2020-04-16 (×3): qty 2

## 2020-04-16 MED ORDER — ARFORMOTEROL TARTRATE 15 MCG/2ML IN NEBU: 15.0000 ug | INHALATION_SOLUTION | Freq: Two times a day (BID) | RESPIRATORY_TRACT | Status: DC

## 2020-04-16 MED ORDER — TIOTROPIUM BROMIDE-OLODATEROL 2.5-2.5 MCG/ACT IN AERS
2.0000 | INHALATION_SPRAY | Freq: Every day | RESPIRATORY_TRACT | Status: DC
  Filled 2020-04-16: qty 1

## 2020-04-16 NOTE — Progress Notes (Signed)
  Subjective: Patient doing reasonably well but has not yet been out of bed.  Is having some pain.  She looks like she is someone who may stay another night in order to mobilize before discharge.   Objective: Vital signs in last 24 hours: Temp:  [97.2 F (36.2 C)-98.9 F (37.2 C)] 98.5 F (36.9 C) (01/08 0519) Pulse Rate:  [85-109] 91 (01/08 0519) Resp:  [12-25] 16 (01/08 0519) BP: (96-134)/(58-107) 103/60 (01/08 0519) SpO2:  [95 %-100 %] 100 % (01/08 0519) Weight:  [68.5 kg] 68.5 kg (01/07 1038)  Intake/Output from previous day: 01/07 0701 - 01/08 0700 In: 1270.4 [P.O.:240; I.V.:880.4; IV Piggyback:150] Out: 1800 [Urine:1600; Blood:200] Intake/Output this shift: Total I/O In: 360 [P.O.:360] Out: -   Exam:  Sensation intact distally Intact pulses distally Dorsiflexion/Plantar flexion intact  Labs: Recent Labs    04/16/20 0323  HGB 8.8*   Recent Labs    04/16/20 0323  WBC 15.6*  RBC 2.93*  HCT 28.4*  PLT 248   Recent Labs    04/16/20 0323  NA 136  K 4.2  CL 101  CO2 27  BUN 12  CREATININE 0.75  GLUCOSE 143*  CALCIUM 9.0   No results for input(s): LABPT, INR in the last 72 hours.  Assessment/Plan: Impression is right total hip replacement.  She is having some issues obtaining the recommended blood thinning agent. sHe has not yet mobilized with therapy.  Anticipate discharge tomorrow.   [name] 04/16/2020, 8:57 AM

## 2020-04-16 NOTE — Plan of Care (Signed)
  Problem: Clinical Measurements: Goal: Respiratory complications will improve Outcome: Progressing   Problem: Clinical Measurements: Goal: Cardiovascular complication will be avoided Outcome: Progressing   Problem: Elimination: Goal: Will not experience complications related to bowel motility Outcome: Progressing   Problem: Education: Goal: Knowledge of the prescribed therapeutic regimen will improve Outcome: Progressing   Problem: Pain Management: Goal: Pain level will decrease with appropriate interventions Outcome: Progressing   Problem: Skin Integrity: Goal: Will show signs of wound healing Outcome: Progressing

## 2020-04-16 NOTE — Plan of Care (Signed)
?  Problem: Education: ?Goal: Knowledge of General Education information will improve ?Description: Including pain rating scale, medication(s)/side effects and non-pharmacologic comfort measures ?Outcome: Progressing ?  ?Problem: Activity: ?Goal: Risk for activity intolerance will decrease ?Outcome: Progressing ?  ?Problem: Nutrition: ?Goal: Adequate nutrition will be maintained ?Outcome: Progressing ?  ?Problem: Coping: ?Goal: Level of anxiety will decrease ?Outcome: Progressing ?  ?Problem: Elimination: ?Goal: Will not experience complications related to urinary retention ?Outcome: Progressing ?  ?Problem: Pain Managment: ?Goal: General experience of comfort will improve ?Outcome: Progressing ?  ?

## 2020-04-16 NOTE — Progress Notes (Signed)
Physical Therapy Treatment Patient Details Name: [name] MRN: [id number] DOB: 1965-12-25 Today's Date: 04/16/2020    History of Present Illness Pt s/p R THR and with hx of COPD, R lung lobectomy and cervical fusion.    PT Comments    Pt very cooperative and progressing with mobility but fatigues easily and pain limited.   Follow Up Recommendations  Home health PT;Follow surgeon's recommendation for DC plan and follow-up therapies     Equipment Recommendations  Rolling walker with 5" wheels;3in1 (PT)    Recommendations for Other Services       Precautions / Restrictions Precautions Precautions: Fall Restrictions Weight Bearing Restrictions: No Other Position/Activity Restrictions: WBAT    Mobility  Bed Mobility Overal bed mobility: Needs Assistance Bed Mobility: Sit to Supine       Sit to supine: Mod assist   General bed mobility comments: Increased time with cues for sequence and assist to manage bil LEs onto bed  Transfers Overall transfer level: Needs assistance Equipment used: Rolling walker (2 wheeled) Transfers: Sit to/from Stand Sit to Stand: Mod assist         General transfer comment: cues for LE management and use of UEs to self assist  Ambulation/Gait Ambulation/Gait assistance: Min assist Gait Distance (Feet): 38 Feet Assistive device: Rolling walker (2 wheeled) Gait Pattern/deviations: Step-to pattern;Decreased step length - right;Decreased step length - left;Shuffle;Trunk flexed Gait velocity: dec   General Gait Details: cues for sequence, posture and position from RW;   Optometrist    Modified Rankin (Stroke Patients Only)       Balance Overall balance assessment: Needs assistance Sitting-balance support: No upper extremity supported;Feet supported Sitting balance-Leahy Scale: Good     Standing balance support: Bilateral upper extremity supported Standing balance-Leahy Scale: Poor                               Cognition Arousal/Alertness: Awake/alert Behavior During Therapy: WFL for tasks assessed/performed Overall Cognitive Status: Within Functional Limits for tasks assessed                                        Exercises      General Comments        Pertinent Vitals/Pain Pain Assessment: 0-10 Pain Score: 6  Pain Location: R hip with activity Pain Descriptors / Indicators: Burning Pain Intervention(s): Limited activity within patient's tolerance;Monitored during session;Premedicated before session;Ice applied    Home Living                      Prior Function            PT Goals (current goals can now be found in the care plan section) Acute Rehab PT Goals Patient Stated Goal: Regain IND PT Goal Formulation: With patient Time For Goal Achievement: 04/23/20 Potential to Achieve Goals: Good Progress towards PT goals: Progressing toward goals    Frequency    7X/week      PT Plan Current plan remains appropriate    Co-evaluation              AM-PAC PT "6 Clicks" Mobility   Outcome Measure  Help needed turning from your back to your side while in a flat bed without using bedrails?: A  Lot Help needed moving from lying on your back to sitting on the side of a flat bed without using bedrails?: A Lot Help needed moving to and from a bed to a chair (including a wheelchair)?: A Lot Help needed standing up from a chair using your arms (e.g., wheelchair or bedside chair)?: A Lot Help needed to walk in hospital room?: A Little Help needed climbing 3-5 steps with a railing? : A Lot 6 Click Score: 13    End of Session Equipment Utilized During Treatment: Gait belt;Oxygen Activity Tolerance: Patient tolerated treatment well;Patient limited by fatigue Patient left: in bed;with call bell/phone within reach;with bed alarm set;with family/visitor present Nurse Communication: Mobility status PT Visit  Diagnosis: Difficulty in walking, not elsewhere classified (R26.2)     Time: 7989-2119 PT Time Calculation (min) (ACUTE ONLY): 20 min  Charges:  $Gait Training: 8-22 mins                     Mauro Kaufmann PT Acute Rehabilitation Services Pager [phone] Office [phone]    Jarl Sellitto 04/16/2020, 5:13 PM

## 2020-04-16 NOTE — Progress Notes (Signed)
RN discussed pt concerns about eliquis cost and pt stated she felt like they could handle the cost. Rn also discussed pt inhaler and called pa on call to get it ordered. Family to bring home inhaler in since pt cannot tolerate powder based inhalers. Pt remains stable. Rn will continue to monitor.

## 2020-04-16 NOTE — Plan of Care (Signed)

## 2020-04-16 NOTE — Evaluation (Signed)
Physical Therapy Evaluation Patient Details Name: [name] MRN: [id number] DOB: November 28, 1965 Today's Date: 04/16/2020   History of Present Illness  Pt s/p R THR and with hx of COPD, R lung lobectomy and cervical fusion.  Clinical Impression  Pt s/p R THR and presents with decreased R LE strength/ROM, post op pain and SOB with exertion limiting functional mobility.  Pt should progress to dc home with family assist.    Follow Up Recommendations Home health PT;Follow surgeon's recommendation for DC plan and follow-up therapies    Equipment Recommendations  Rolling walker with 5" wheels;3in1 (PT)    Recommendations for Other Services       Precautions / Restrictions Precautions Precautions: Fall Restrictions Weight Bearing Restrictions: No Other Position/Activity Restrictions: WBAT      Mobility  Bed Mobility               General bed mobility comments: On BSC with assist of nursing on arrival    Transfers Overall transfer level: Needs assistance Equipment used: Rolling walker (2 wheeled) Transfers: Sit to/from Stand Sit to Stand: Min assist;Mod assist;+2 safety/equipment;From elevated surface         General transfer comment: cues for LE management and use of UEs to self assist  Ambulation/Gait Ambulation/Gait assistance: Min assist;+2 safety/equipment Gait Distance (Feet): 18 Feet Assistive device: Rolling walker (2 wheeled) Gait Pattern/deviations: Step-to pattern;Decreased step length - right;Decreased step length - left;Shuffle;Trunk flexed Gait velocity: dec   General Gait Details: cues for sequence, posture and position from RW; distance ltd by SOB and increased pain  Stairs            Wheelchair Mobility    Modified Rankin (Stroke Patients Only)       Balance Overall balance assessment: Needs assistance Sitting-balance support: No upper extremity supported;Feet supported Sitting balance-Leahy Scale: Fair     Standing balance  support: Bilateral upper extremity supported Standing balance-Leahy Scale: Poor                               Pertinent Vitals/Pain Pain Assessment: 0-10 Pain Score: 8  Pain Location: R hip with activity Pain Descriptors / Indicators: Burning Pain Intervention(s): Limited activity within patient's tolerance;Monitored during session;Premedicated before session;Ice applied    Home Living Family/patient expects to be discharged to:: Private residence Living Arrangements: Spouse/significant other Available Help at Discharge: Family Type of Home: House Home Access: Stairs to enter Entrance Stairs-Rails: None Secretary/administrator of Steps: 2 Home Layout: One level Home Equipment: Environmental consultant - 4 wheels      Prior Function Level of Independence: Independent with assistive device(s)         Comments: using rollator     Hand Dominance        Extremity/Trunk Assessment   Upper Extremity Assessment Upper Extremity Assessment: Generalized weakness    Lower Extremity Assessment Lower Extremity Assessment: RLE deficits/detail RLE: Unable to fully assess due to pain    Cervical / Trunk Assessment Cervical / Trunk Assessment: Kyphotic  Communication   Communication: No difficulties  Cognition Arousal/Alertness: Awake/alert Behavior During Therapy: WFL for tasks assessed/performed Overall Cognitive Status: Within Functional Limits for tasks assessed                                        General Comments      Exercises Total Joint Exercises  Ankle Circles/Pumps: AROM;Both;15 reps;Supine   Assessment/Plan    PT Assessment Patient needs continued PT services  PT Problem List Decreased strength;Decreased range of motion;Decreased activity tolerance;Decreased balance;Decreased mobility;Decreased knowledge of use of DME;Pain       PT Treatment Interventions DME instruction;Gait training;Stair training;Functional mobility training;Therapeutic  activities;Therapeutic exercise;Patient/family education;Balance training    PT Goals (Current goals can be found in the Care Plan section)  Acute Rehab PT Goals Patient Stated Goal: Regain IND PT Goal Formulation: With patient Time For Goal Achievement: 04/23/20 Potential to Achieve Goals: Good    Frequency 7X/week   Barriers to discharge        Co-evaluation               AM-PAC PT "6 Clicks" Mobility  Outcome Measure Help needed turning from your back to your side while in a flat bed without using bedrails?: A Lot Help needed moving from lying on your back to sitting on the side of a flat bed without using bedrails?: A Lot Help needed moving to and from a bed to a chair (including a wheelchair)?: A Lot Help needed standing up from a chair using your arms (e.g., wheelchair or bedside chair)?: A Lot Help needed to walk in hospital room?: A Little Help needed climbing 3-5 steps with a railing? : A Lot 6 Click Score: 13    End of Session Equipment Utilized During Treatment: Gait belt;Oxygen Activity Tolerance: Patient tolerated treatment well Patient left: in chair;with call bell/phone within reach;with chair alarm set;with family/visitor present Nurse Communication: Mobility status PT Visit Diagnosis: Difficulty in walking, not elsewhere classified (R26.2)    Time: 0947-0962 PT Time Calculation (min) (ACUTE ONLY): 26 min   Charges:   PT Evaluation $PT Eval Low Complexity: 1 Low PT Treatments $Gait Training: 8-22 mins        Mauro Kaufmann PT Acute Rehabilitation Services Pager [phone] Office [phone]   [name] 04/16/2020, 12:47 PM

## 2020-04-17 DIAGNOSIS — M87051 Idiopathic aseptic necrosis of right femur: Secondary | ICD-10-CM | POA: Diagnosis not present

## 2020-04-17 LAB — CBC
HCT: 26.8 % — ABNORMAL LOW (ref 36.0–46.0)
Hemoglobin: 8.4 g/dL — ABNORMAL LOW (ref 12.0–15.0)
MCH: 30.1 pg (ref 26.0–34.0)
MCHC: 31.3 g/dL (ref 30.0–36.0)
MCV: 96.1 fL (ref 80.0–100.0)
Platelets: 215 10*3/uL (ref 150–400)
RBC: 2.79 MIL/uL — ABNORMAL LOW (ref 3.87–5.11)
RDW: 13.2 % (ref 11.5–15.5)
WBC: 15 10*3/uL — ABNORMAL HIGH (ref 4.0–10.5)
nRBC: 0 % (ref 0.0–0.2)

## 2020-04-17 MED ORDER — SODIUM CHLORIDE 0.9 % IV BOLUS: 500.0000 mL | Freq: Once | INTRAVENOUS | Status: DC

## 2020-04-17 NOTE — Progress Notes (Signed)
Pt stable but remains weak and uncomfortable with going home today. She feels fatigued overall. Rn medicating for needs. Rn will continue to monitor.

## 2020-04-17 NOTE — Progress Notes (Addendum)
  Subjective: [name] is a 55 y.o. female s/p right THA.  They are POD 2.  Pt's pain is controlled.  Pt has ambulated with some difficulty.   Progressing well in therapy but does note some continued dizziness when ambulating.  This seems to be improving.  Lives at home with her husband.  Denies any dizziness, lightheadedness, shortness of breath when sitting in chair.  Objective: Vital signs in last 24 hours: Temp:  [98.4 F (36.9 C)-99.4 F (37.4 C)] 99.4 F (37.4 C) (01/09 0519) Pulse Rate:  [89-97] 97 (01/09 0519) Resp:  [16-20] 20 (01/09 0519) BP: (100-113)/(56-79) 108/70 (01/09 0519) SpO2:  [97 %-100 %] 97 % (01/09 0519)  Intake/Output from previous day: 01/08 0701 - 01/09 0700 In: 1560 [P.O.:1560] Out: 750 [Urine:750] Intake/Output this shift: Total I/O In: 600 [P.O.:600] Out: -   Exam:  No gross blood or drainage overlying the dressing 2+ DP pulse Sensation intact distally in the right foot Able to dorsiflex and plantarflex the right foot   Labs: Recent Labs    04/16/20 0323 04/17/20 0321  HGB 8.8* 8.4*   Recent Labs    04/16/20 0323 04/17/20 0321  WBC 15.6* 15.0*  RBC 2.93* 2.79*  HCT 28.4* 26.8*  PLT 248 215   Recent Labs    04/16/20 0323  NA 136  K 4.2  CL 101  CO2 27  BUN 12  CREATININE 0.75  GLUCOSE 143*  CALCIUM 9.0   No results for input(s): LABPT, INR in the last 72 hours.  Assessment/Plan: Pt is POD 2 s/p right THA.    -Plan to discharge to home today or tomorrow pending patient's pain and PT eval.  Mostly want to make sure that she ambulates without any lightheadedness or dizziness this afternoon during her second therapy session.  If she does well with this, plan for discharge after therapy session.  If continued lightheadedness, plan to pursue likely discharge tomorrow.   -Hemoglobin down to 8.4 today.  -WBAT with a walker   [name] 04/17/2020, 11:10 AM

## 2020-04-17 NOTE — Progress Notes (Signed)
Physical Therapy Treatment Patient Details Name: [name] MRN: [id number] DOB: [birth year] Today's Date: 04/17/2020    History of Present Illness Pt s/p R THR and with hx of COPD, R lung lobectomy and cervical fusion.    PT Comments    Pt very cooperative and progressing with mobility but with limited endurance and fatigues easily.   Follow Up Recommendations  Home health PT;Follow surgeon's recommendation for DC plan and follow-up therapies     Equipment Recommendations  Rolling walker with 5" wheels;3in1 (PT)    Recommendations for Other Services       Precautions / Restrictions Precautions Precautions: Fall Restrictions Weight Bearing Restrictions: No Other Position/Activity Restrictions: WBAT    Mobility  Bed Mobility Overal bed mobility: Needs Assistance Bed Mobility: Supine to Sit     Supine to sit: Min assist     General bed mobility comments: increased time with cues for sequence and use of L LE  Transfers Overall transfer level: Needs assistance Equipment used: Rolling walker (2 wheeled) Transfers: Sit to/from Stand Sit to Stand: Min assist;From elevated surface         General transfer comment: cues for LE management and use of UEs to self assist  Ambulation/Gait Ambulation/Gait assistance: Min assist Gait Distance (Feet): 42 Feet Assistive device: Rolling walker (2 wheeled) Gait Pattern/deviations: Step-to pattern;Shuffle;Trunk flexed Gait velocity: dec   General Gait Details: cues for sequence, posture and position from RW;   Optometrist    Modified Rankin (Stroke Patients Only)       Balance Overall balance assessment: Needs assistance Sitting-balance support: No upper extremity supported;Feet supported Sitting balance-Leahy Scale: Good     Standing balance support: Bilateral upper extremity supported Standing balance-Leahy Scale: Poor                               Cognition Arousal/Alertness: Awake/alert Behavior During Therapy: WFL for tasks assessed/performed Overall Cognitive Status: Within Functional Limits for tasks assessed                                        Exercises Total Joint Exercises Ankle Circles/Pumps: AROM;Both;15 reps;Supine Quad Sets: AROM;Both;10 reps;Supine Heel Slides: AAROM;Right;20 reps;Supine Hip ABduction/ADduction: AAROM;Right;15 reps;Supine    General Comments        Pertinent Vitals/Pain Pain Assessment: 0-10 Pain Score: 5  Pain Location: R hip with activity Pain Descriptors / Indicators: Burning Pain Intervention(s): Limited activity within patient's tolerance;Monitored during session;Premedicated before session;Ice applied    Home Living                      Prior Function            PT Goals (current goals can now be found in the care plan section) Acute Rehab PT Goals Patient Stated Goal: Regain IND PT Goal Formulation: With patient Time For Goal Achievement: 04/23/20 Potential to Achieve Goals: Good Progress towards PT goals: Progressing toward goals    Frequency    7X/week      PT Plan Current plan remains appropriate    Co-evaluation              AM-PAC PT "6 Clicks" Mobility   Outcome Measure  Help needed turning from your back to your side  while in a flat bed without using bedrails?: A Little Help needed moving from lying on your back to sitting on the side of a flat bed without using bedrails?: A Little Help needed moving to and from a bed to a chair (including a wheelchair)?: A Little Help needed standing up from a chair using your arms (e.g., wheelchair or bedside chair)?: A Little Help needed to walk in hospital room?: A Little Help needed climbing 3-5 steps with a railing? : A Lot 6 Click Score: 17    End of Session Equipment Utilized During Treatment: Gait belt;Oxygen Activity Tolerance: Patient tolerated treatment well;Patient limited  by fatigue Patient left: in chair;with call bell/phone within reach;with chair alarm set Nurse Communication: Mobility status PT Visit Diagnosis: Difficulty in walking, not elsewhere classified (R26.2)     Time: 1015-1040 PT Time Calculation (min) (ACUTE ONLY): 25 min  Charges:  $Gait Training: 8-22 mins $Therapeutic Exercise: 8-22 mins                     Mauro Kaufmann PT Acute Rehabilitation Services Pager [phone] Office [phone]    Voula Waln 04/17/2020, 1:30 PM

## 2020-04-17 NOTE — Progress Notes (Signed)
Physical Therapy Treatment Patient Details Name: [name] MRN: [id number] DOB: March 18, 1966 Today's Date: 04/17/2020    History of Present Illness Pt s/p R THR and with hx of COPD, R lung lobectomy and cervical fusion.    PT Comments    Pt very motivated and progressing steadily with mobility and with noted improvement in activity tolerance, ambulatory stability and with decreasing assist needed for all tasks.   Follow Up Recommendations  Home health PT;Follow surgeon's recommendation for DC plan and follow-up therapies     Equipment Recommendations  Rolling walker with 5" wheels;3in1 (PT)    Recommendations for Other Services       Precautions / Restrictions Precautions Precautions: Fall Restrictions Weight Bearing Restrictions: No Other Position/Activity Restrictions: WBAT    Mobility  Bed Mobility Overal bed mobility: Needs Assistance Bed Mobility: Sit to Supine       Sit to supine: Min assist   General bed mobility comments: increased time with cues for sequence and use of L LE to self assist;  Physical assist to bring LEs onto bed  Transfers Overall transfer level: Needs assistance Equipment used: Rolling walker (2 wheeled) Transfers: Sit to/from Stand Sit to Stand: Min assist;Min guard         General transfer comment: cues for LE management and use of UEs to self assist  Ambulation/Gait Ambulation/Gait assistance: Min guard Gait Distance (Feet): 95 Feet Assistive device: Rolling walker (2 wheeled) Gait Pattern/deviations: Step-to pattern;Shuffle;Trunk flexed Gait velocity: dec   General Gait Details: min cues for sequence, posture and position from RW;   Stairs Stairs: Yes Stairs assistance: Min assist Stair Management: No rails;Backwards;Step to pattern;With walker Number of Stairs: 6 General stair comments: 2 steps 3x with spouse assisting   Wheelchair Mobility    Modified Rankin (Stroke Patients Only)       Balance Overall  balance assessment: Needs assistance Sitting-balance support: No upper extremity supported;Feet supported Sitting balance-Leahy Scale: Good     Standing balance support: No upper extremity supported Standing balance-Leahy Scale: Fair                              Cognition Arousal/Alertness: Awake/alert Behavior During Therapy: WFL for tasks assessed/performed Overall Cognitive Status: Within Functional Limits for tasks assessed                                        Exercises      General Comments        Pertinent Vitals/Pain Pain Assessment: 0-10 Pain Score: 4  Pain Location: R hip with activity Pain Descriptors / Indicators: Burning Pain Intervention(s): Limited activity within patient's tolerance;Monitored during session;Premedicated before session;Ice applied    Home Living                      Prior Function            PT Goals (current goals can now be found in the care plan section) Acute Rehab PT Goals Patient Stated Goal: Regain IND PT Goal Formulation: With patient Time For Goal Achievement: 04/23/20 Potential to Achieve Goals: Good Progress towards PT goals: Progressing toward goals    Frequency    7X/week      PT Plan Current plan remains appropriate    Co-evaluation  AM-PAC PT "6 Clicks" Mobility   Outcome Measure  Help needed turning from your back to your side while in a flat bed without using bedrails?: A Little Help needed moving from lying on your back to sitting on the side of a flat bed without using bedrails?: A Little Help needed moving to and from a bed to a chair (including a wheelchair)?: A Little Help needed standing up from a chair using your arms (e.g., wheelchair or bedside chair)?: A Little Help needed to walk in hospital room?: A Little Help needed climbing 3-5 steps with a railing? : A Little 6 Click Score: 18    End of Session Equipment Utilized During Treatment:  Gait belt;Oxygen Activity Tolerance: Patient tolerated treatment well Patient left: in bed;with call bell/phone within reach;with bed alarm set;with family/visitor present Nurse Communication: Mobility status PT Visit Diagnosis: Difficulty in walking, not elsewhere classified (R26.2)     Time: 1531-1601 PT Time Calculation (min) (ACUTE ONLY): 30 min  Charges:  $Gait Training: 8-22 mins $Therapeutic Activity: 8-22 mins                     Mauro Kaufmann PT Acute Rehabilitation Services Pager [phone] Office [phone]    [name] 04/17/2020, 5:27 PM

## 2020-04-17 NOTE — TOC Progression Note (Signed)
Transition of Care [facility]) - Progression Note    Patient Details  Name: [name] MRN: [id number] Date of Birth: 16-Dec-1965  Transition of Care [facility]) CM/SW Contact  Armanda Heritage, RN Phone Number: 04/17/2020, 12:21 PM  Clinical Narrative:    [employer] to deliver rolling walker and 3in1 to bedside for home use.  [facility] to provide HHPT services.   Expected Discharge Plan: Home w Home Health Services Barriers to Discharge: No Barriers Identified  Expected Discharge Plan and Services Expected Discharge Plan: Home w Home Health Services   Discharge Planning Services: CM Consult Post Acute Care Choice: Home Health Living arrangements for the past 2 months: Single Family Home                 DME Arranged: 3-N-1,Walker rolling DME Agency: [employer] Date DME Agency Contacted: 04/17/20 Time DME Agency Contacted: 1220 Representative spoke with at DME Agency: Lucrecia             Social Determinants of Health (SDOH) Interventions    Readmission Risk Interventions No flowsheet data found.

## 2020-04-17 NOTE — Plan of Care (Signed)
?  Problem: Clinical Measurements: ?Goal: Respiratory complications will improve ?Outcome: Progressing ?  ?Problem: Clinical Measurements: ?Goal: Cardiovascular complication will be avoided ?Outcome: Progressing ?  ?Problem: Coping: ?Goal: Level of anxiety will decrease ?Outcome: Progressing ?  ?Problem: Elimination: ?Goal: Will not experience complications related to bowel motility ?Outcome: Progressing ?  ?

## 2020-04-18 ENCOUNTER — Encounter (HOSPITAL_COMMUNITY): Payer: Self-pay | Admitting: Orthopaedic Surgery

## 2020-04-18 ENCOUNTER — Other Ambulatory Visit: Payer: Self-pay | Admitting: Physician Assistant

## 2020-04-18 DIAGNOSIS — M87051 Idiopathic aseptic necrosis of right femur: Secondary | ICD-10-CM | POA: Diagnosis not present

## 2020-04-18 LAB — CBC
HCT: 26.1 % — ABNORMAL LOW (ref 36.0–46.0)
Hemoglobin: 8.3 g/dL — ABNORMAL LOW (ref 12.0–15.0)
MCH: 30.4 pg (ref 26.0–34.0)
MCHC: 31.8 g/dL (ref 30.0–36.0)
MCV: 95.6 fL (ref 80.0–100.0)
Platelets: 229 10*3/uL (ref 150–400)
RBC: 2.73 MIL/uL — ABNORMAL LOW (ref 3.87–5.11)
RDW: 13.1 % (ref 11.5–15.5)
WBC: 16.4 10*3/uL — ABNORMAL HIGH (ref 4.0–10.5)
nRBC: 0.1 % (ref 0.0–0.2)

## 2020-04-18 MED ORDER — GABAPENTIN 300 MG PO CAPS: 300.0000 mg | ORAL_CAPSULE | Freq: Every day | ORAL | Status: DC

## 2020-04-18 MED ORDER — GABAPENTIN 100 MG PO CAPS: 300.0000 mg | ORAL_CAPSULE | Freq: Every day | ORAL | 0 refills | Status: DC

## 2020-04-18 NOTE — Plan of Care (Signed)
Pt ready for DC home 

## 2020-04-18 NOTE — Progress Notes (Signed)
Subjective: 3 Days Post-Op Procedure(s) (LRB): RIGHT TOTAL HIP ARTHROPLASTY ANTERIOR APPROACH (Right) Patient reports pain as moderate.  Complaining of burning at night. Wants to take Neurontin at night only.  Otherwise no complaints.   Objective: Vital signs in last 24 hours: Temp:  [98.4 F (36.9 C)-98.6 F (37 C)] 98.6 F (37 C) (01/10 0622) Pulse Rate:  [91-105] 105 (01/10 0622) Resp:  [16-17] 16 (01/10 0622) BP: (106-115)/(56-74) 110/61 (01/10 0622) SpO2:  [96 %-98 %] 97 % (01/10 0622)  Intake/Output from previous day: 01/09 0701 - 01/10 0700 In: 1560 [P.O.:1560] Out: -  Intake/Output this shift: No intake/output data recorded.  Recent Labs    04/16/20 0323 04/17/20 0321 04/18/20 0339  HGB 8.8* 8.4* 8.3*   Recent Labs    04/17/20 0321 04/18/20 0339  WBC 15.0* 16.4*  RBC 2.79* 2.73*  HCT 26.8* 26.1*  PLT 215 229   Recent Labs    04/16/20 0323  NA 136  K 4.2  CL 101  CO2 27  BUN 12  CREATININE 0.75  GLUCOSE 143*  CALCIUM 9.0   No results for input(s): LABPT, INR in the last 72 hours.  Right lower extremity:  Dorsiflexion/Plantar flexion intact Incision: scant drainage Compartment soft   Assessment/Plan: 3 Days Post-Op Procedure(s) (LRB): RIGHT TOTAL HIP ARTHROPLASTY ANTERIOR APPROACH (Right) Up with therapy Discharge home with home health if patient remains stable and does well with PT.       [name] 04/18/2020, 8:09 AM

## 2020-04-18 NOTE — Progress Notes (Signed)
Physical Therapy Treatment Patient Details Name: [name] MRN: [id number] DOB: 1966-01-29 Today's Date: 04/18/2020    History of Present Illness Pt s/p R THR and with hx of COPD, R lung lobectomy and cervical fusion.    PT Comments    POD # 3 am session Spouse present during session.  Pt on 4 lts O2 (chronic).  Assisted OOB to amb to bathroom.  Assisted in bathroom.  Assisted with amb in hallway Then returned to room to perform some TE's following HEP handout.  Instructed on proper tech, freq as well as use of ICE.   Addressed all mobility questions, discussed appropriate activity, educated on use of ICE.  Pt ready for D/C to home.   Follow Up Recommendations  Home health PT;Follow surgeon's recommendation for DC plan and follow-up therapies     Equipment Recommendations  Rolling walker with 5" wheels;3in1 (PT)    Recommendations for Other Services       Precautions / Restrictions Precautions Precautions: Fall Restrictions Weight Bearing Restrictions: No Other Position/Activity Restrictions: WBAT    Mobility  Bed Mobility Overal bed mobility: Needs Assistance Bed Mobility: Supine to Sit     Supine to sit: Min guard     General bed mobility comments: increased time with cues for sequence and use of L LE to self assist;  Physical assist to bring LEs onto bed  Transfers Overall transfer level: Needs assistance Equipment used: Rolling walker (2 wheeled) Transfers: Sit to/from Stand Sit to Stand: Supervision         General transfer comment: cues for LE management and use of UEs to self assist (improving)  Ambulation/Gait Ambulation/Gait assistance: Supervision Gait Distance (Feet): 65 Feet Assistive device: Rolling walker (2 wheeled) Gait Pattern/deviations: Step-to pattern;Shuffle;Trunk flexed Gait velocity: decreased   General Gait Details: <25% VC's on safety with turns   Social research officer, government Rankin  (Stroke Patients Only)       Balance                                            Cognition Arousal/Alertness: Awake/alert Behavior During Therapy: WFL for tasks assessed/performed Overall Cognitive Status: Within Functional Limits for tasks assessed                                 General Comments: AxO x 3 very pleasant      Exercises   Total Hip Replacement TE's following HEP Handout 10 reps ankle pumps 05 reps knee presses 05 reps heel slides 05 reps SAQ's 05 reps ABD Instructed how to use a belt loop to assist  Followed by ICE     General Comments        Pertinent Vitals/Pain Pain Assessment: 0-10 Pain Score: 5  Pain Location: R hip with activity Pain Descriptors / Indicators: Burning;Tightness Pain Intervention(s): Monitored during session;Premedicated before session;Repositioned;Ice applied    Home Living                      Prior Function            PT Goals (current goals can now be found in the care plan section) Progress towards PT goals: Progressing toward goals    Frequency  7X/week      PT Plan Current plan remains appropriate    Co-evaluation              AM-PAC PT "6 Clicks" Mobility   Outcome Measure  Help needed turning from your back to your side while in a flat bed without using bedrails?: A Little Help needed moving from lying on your back to sitting on the side of a flat bed without using bedrails?: A Little Help needed moving to and from a bed to a chair (including a wheelchair)?: A Little Help needed standing up from a chair using your arms (e.g., wheelchair or bedside chair)?: A Little Help needed to walk in hospital room?: A Little Help needed climbing 3-5 steps with a railing? : A Little 6 Click Score: 18    End of Session Equipment Utilized During Treatment: Gait belt;Oxygen Activity Tolerance: Patient tolerated treatment well Patient left: in chair Nurse  Communication: Mobility status (pt ready for D/C to home after one session) PT Visit Diagnosis: Difficulty in walking, not elsewhere classified (R26.2)     Time: 0940-1010 PT Time Calculation (min) (ACUTE ONLY): 30 min  Charges:  $Gait Training: 8-22 mins $Therapeutic Exercise: 8-22 mins                     Felecia Shelling  PTA Acute  Rehabilitation Services Pager      [phone] Office      [phone]

## 2020-04-18 NOTE — Discharge Summary (Signed)
Patient ID: [name] MRN: [id number] DOB/AGE: [birth year] 55 y.o.  Admit date: 04/15/2020 Discharge date: 04/18/2020  Admission Diagnoses:  Principal Problem:   Avascular necrosis of bone of right hip [facility]) Active Problems:   Status post total replacement of right hip   Discharge Diagnoses:  Status post right total hip arthroplasty  Past Medical History:  Diagnosis Date  . Anxiety   . Asthma   . Cervical radiculopathy   . Cluster headaches   . COPD (chronic obstructive pulmonary disease) (HCC)   . COPD (chronic obstructive pulmonary disease) with chronic bronchitis (HCC)   . GERD (gastroesophageal reflux disease)   . Hyperlipemia   . Hypoglycemia   . Hypothyroidism   . Migraine   . Pneumothorax   . Pre-diabetes   . Rectal discharge 07/28/2010  . Shortness of breath   . Sleep apnea    cannot tolerate-not using CPAP  . Sluggishness 07/28/2010    Surgeries: Procedure(s): RIGHT TOTAL HIP ARTHROPLASTY ANTERIOR APPROACH on 04/15/2020   Consultants:   Discharged Condition: Improved  Hospital Course: [name] is an 55 y.o. female who was admitted 04/15/2020 for operative treatment ofAvascular necrosis of bone of right hip (HCC). Patient has severe unremitting pain that affects sleep, daily activities, and work/hobbies. After pre-op clearance the patient was taken to the operating room on 04/15/2020 and underwent  Procedure(s): RIGHT TOTAL HIP ARTHROPLASTY ANTERIOR APPROACH.    Patient was given perioperative antibiotics:  Anti-infectives (From admission, onward)   Start     Dose/Rate Route Frequency Ordered Stop   04/16/20 2200  clindamycin (CLEOCIN) IVPB 600 mg  Status:  Discontinued        600 mg 100 mL/hr over 30 Minutes Intravenous Every 6 hours 04/15/20 2014 04/15/20 2015   04/16/20 2200  clindamycin (CLEOCIN) IVPB 600 mg        600 mg 100 mL/hr over 30 Minutes Intravenous Every 6 hours 04/15/20 2015 04/17/20 0959   04/15/20 1800  clindamycin  (CLEOCIN) IVPB 600 mg  Status:  Discontinued        600 mg 100 mL/hr over 30 Minutes Intravenous Every 6 hours 04/15/20 1404 04/15/20 2014   04/15/20 1015  clindamycin (CLEOCIN) IVPB 900 mg        900 mg 100 mL/hr over 30 Minutes Intravenous On call to O.R. 04/15/20 1014 04/15/20 1241       Patient was given sequential compression devices, early ambulation, and chemoprophylaxis to prevent DVT.  Patient benefited maximally from hospital stay and there were no complications.    Recent vital signs:  Patient Vitals for the past 24 hrs:  BP Temp Temp src Pulse Resp SpO2  04/18/20 0622 110/61 98.6 F (37 C) Oral (!) 105 16 97 %  04/17/20 2112 115/74 98.4 F (36.9 C) Oral 91 17 98 %  04/17/20 1230 (!) 106/56 98.4 F (36.9 C) Oral 100 17 96 %     Recent laboratory studies:  Recent Labs    04/16/20 0323 04/17/20 0321 04/18/20 0339  WBC 15.6* 15.0* 16.4*  HGB 8.8* 8.4* 8.3*  HCT 28.4* 26.8* 26.1*  PLT 248 215 229  NA 136  --   --   K 4.2  --   --   CL 101  --   --   CO2 27  --   --   BUN 12  --   --   CREATININE 0.75  --   --   GLUCOSE 143*  --   --  CALCIUM 9.0  --   --      Discharge Medications:   Allergies as of 04/18/2020      Reactions   Aspirin Shortness Of Breath   Causes asthma flares    Penicillins Other (See Comments)   UNSPECIFIED REACTION OF CHILDHOOD Has patient had a PCN reaction causing immediate rash, facial/tongue/throat swelling, SOB or lightheadedness with hypotension: NO Has patient had a PCN reaction causing severe rash involving mucus membranes or skin necrosis: NO Has patient had a PCN reaction that required hospitalization: no Has patient had a PCN reaction occurring within the last 10 years: NO If all of the above answers are "NO", then may proceed with Cephalosporin use.   Percocet [oxycodone-acetaminophen]    migraines   Tramadol    insomnia   Eggs Or Egg-derived Products Diarrhea, Other (See Comments)   BLOATING > GAS   Vicodin  [hydrocodone-acetaminophen] Other (See Comments)   Headaches       Medication List    STOP taking these medications   acetaminophen-codeine 300-30 MG tablet Commonly known as: TYLENOL #3     TAKE these medications   acetaminophen 500 MG tablet Commonly known as: TYLENOL Take 1,000 mg by mouth every 6 (six) hours as needed for moderate pain.   albuterol 108 (90 Base) MCG/ACT inhaler Commonly known as: VENTOLIN HFA Inhale 2 puffs into the lungs every 4 (four) hours as needed for shortness of breath.   albuterol (2.5 MG/3ML) 0.083% nebulizer solution Commonly known as: PROVENTIL Take 3 mLs (2.5 mg total) by nebulization every 4 (four) hours as needed for wheezing or shortness of breath.   apixaban 2.5 MG Tabs tablet Commonly known as: ELIQUIS Take 1 tablet (2.5 mg total) by mouth every 12 (twelve) hours.   aspirin-acetaminophen-caffeine 250-250-65 MG tablet Commonly known as: EXCEDRIN MIGRAINE Take 2 tablets by mouth daily.   bismuth subsalicylate 262 MG chewable tablet Commonly known as: PEPTO BISMOL Chew 524 mg by mouth as needed for indigestion.   calcium carbonate 750 MG chewable tablet Commonly known as: TUMS EX Chew 2 tablets by mouth in the morning, at noon, in the evening, and at bedtime.   cetirizine 10 MG tablet Commonly known as: ZYRTEC Take 10 mg by mouth daily.   citalopram 40 MG tablet Commonly known as: CELEXA Take 40 mg by mouth at bedtime.   docusate sodium 100 MG capsule Commonly known as: COLACE Take 100-200 mg by mouth daily as needed for mild constipation.   levothyroxine 50 MCG tablet Commonly known as: SYNTHROID Take 50 mcg by mouth daily before breakfast.   LORazepam 1 MG tablet Commonly known as: ATIVAN Take 1 mg by mouth See admin instructions. Take 1 mg at bedtime, may take a second 1 mg dose during the day as needed for anxiety   metaxalone 400 MG tablet Commonly known as: SKELAXIN Take 400 mg by mouth 3 (three) times daily as  needed (muscle spasms).   montelukast 10 MG tablet Commonly known as: SINGULAIR Take 1 tablet (10 mg total) by mouth at bedtime.   oxyCODONE 5 MG immediate release tablet Commonly known as: Oxy IR/ROXICODONE Take 1-2 tablets (5-10 mg total) by mouth every 6 (six) hours as needed for moderate pain (pain score 4-6).   pantoprazole 40 MG tablet Commonly known as: PROTONIX Take 30- 60 min before your first and last meals of the day What changed:   how much to take  how to take this  when to take this  pravastatin 40 MG tablet Commonly known as: PRAVACHOL Take 40 mg by mouth at bedtime.   predniSONE 10 MG tablet Commonly known as: DELTASONE Take 2 tablets (20 mg total) by mouth daily.   Stiolto Respimat 2.5-2.5 MCG/ACT Aers Generic drug: Tiotropium Bromide-Olodaterol Inhale 2 puffs into the lungs daily.   SUMAtriptan 6 MG/0.5ML Soaj Inject 6 mg as directed daily as needed (migraine).   valACYclovir 500 MG tablet Commonly known as: VALTREX Take 500-2,000 mg by mouth See admin instructions. Take 2000mg s at first sign of fever blister outbreak then take 500mg s daily until gone             (From admission, onward)         Start     Ordered   04/15/20 1757  DME 3 n 1  Once        04/15/20 1756   04/15/20 1757  DME Walker rolling  Once       Question Answer Comment  Walker: With 5 Inch Wheels   Patient needs a walker to treat with the following condition Status post total replacement of right hip      04/15/20 1756          Diagnostic Studies: NM Myocar Multi W/Spect W/Wall Motion / EF  Result Date: 04/05/2020  Lexiscan stress is electrically negative for ischemia  Myovue scan shows normal perfusion  LVEF 74%  Low risk study    DG C-Arm 1-60 Min-No Report  Result Date: 04/15/2020 Fluoroscopy was utilized by the requesting physician.  No radiographic interpretation.   DG HIP OPERATIVE UNILAT W OR W/O PELVIS RIGHT  Result  Date: 04/15/2020 CLINICAL DATA:  Status post right total hip arthroplasty. EXAM: OPERATIVE right HIP (WITH PELVIS IF PERFORMED) 3 VIEWS TECHNIQUE: Fluoroscopic spot image(s) were submitted for interpretation post-operatively. Radiation exposure index: 1.8078 mGy. COMPARISON:  April 12, 2000. FINDINGS: Three intraoperative fluoroscopic images of the right hip were obtained. The right femoral and acetabular components are well situated. IMPRESSION: Status post right total hip arthroplasty. Electronically Signed   By: 06/13/2020 M.D.   On: 04/15/2020 14:11   XR HIP UNILAT W OR W/O PELVIS 2-3 VIEWS RIGHT  Result Date: 04/12/2020 AP pelvis and lateral view of the right hip shows avascular necrosis changes of femoral head but no acute fracture.  The hip is well located.  No pelvic fractures noted.   Disposition: Discharge disposition: 01-Home or Self Care          Follow-up Information    06/13/2020, MD Follow up in 2 week(s).   Specialty: Orthopedic Surgery Contact information: [address] [place] [address] [place] [phone]        [facility] Follow up.   Specialty: Home Health Services Why: agency will provide home health physical therapy. Contact information: [address] [place] [address] [place] [phone]                Signed: [id number] 04/18/2020, 8:08 AM

## 2020-04-18 NOTE — Plan of Care (Signed)
Plan of care reviewed and discussed with the patient. 

## 2020-04-22 ENCOUNTER — Telehealth: Payer: Self-pay | Admitting: Orthopaedic Surgery

## 2020-04-22 NOTE — Telephone Encounter (Signed)
Patient called requesting a call back. Patient has medical questions. Please call patient at [phone].

## 2020-04-22 NOTE — Telephone Encounter (Signed)
Called patient back to answer her questions.

## 2020-04-25 ENCOUNTER — Other Ambulatory Visit: Payer: Self-pay | Admitting: Orthopaedic Surgery

## 2020-04-25 MED ORDER — OXYCODONE-ACETAMINOPHEN 5-325 MG PO TABS: 1.0000 | ORAL_TABLET | Freq: Four times a day (QID) | ORAL | 0 refills | Status: DC | PRN

## 2020-04-25 NOTE — Progress Notes (Signed)
Office closed. Oxy 5mg  sent in for her # 30 for post op THA pain.

## 2020-04-27 ENCOUNTER — Encounter: Payer: Self-pay | Admitting: Orthopaedic Surgery

## 2020-05-02 ENCOUNTER — Ambulatory Visit (INDEPENDENT_AMBULATORY_CARE_PROVIDER_SITE_OTHER): Payer: 59 | Admitting: Orthopaedic Surgery

## 2020-05-02 ENCOUNTER — Encounter: Payer: Self-pay | Admitting: Orthopaedic Surgery

## 2020-05-02 DIAGNOSIS — Z96641 Presence of right artificial hip joint: Secondary | ICD-10-CM | POA: Insufficient documentation

## 2020-05-02 NOTE — Progress Notes (Signed)
The patient is here today status post a right total hip arthroplasty.  She is ambulating using a rolling walker.  She is on chronic home O2.  We did have her on Eliquis for blood thinning purposes.  She denies any calf pain or foot and ankle swelling.  I will have her stop her Eliquis.  She reports that she does feel like she is ready to drive.  Her family is with her today.  Her right hip incision looks good.  Remove the staples in place Steri-Strips.  There is no significant seroma.  Her leg lengths are near equal.  She will continue to increase her activities as comfort allows with no restrictions.  I would like to see her back in 4 weeks to see how she is doing overall but no x-rays are needed.  All questions and concerns were answered and addressed.

## 2020-05-04 ENCOUNTER — Telehealth: Payer: Self-pay | Admitting: Internal Medicine

## 2020-05-04 MED ORDER — STIOLTO RESPIMAT 2.5-2.5 MCG/ACT IN AERS: 2.0000 | INHALATION_SPRAY | Freq: Every day | RESPIRATORY_TRACT | 0 refills | Status: DC

## 2020-05-04 NOTE — Telephone Encounter (Signed)
2 samples up front  Pt aware  States ins not covering  I advised can not always rely on samples so should call ins for covered alternatives now before samples run out  She verbalized understanding  Nothing further needed

## 2020-05-04 NOTE — Telephone Encounter (Signed)
Called and spoke with pt to see if she sees MW at [facility] office or [place] office and pt states that she sees MW in [place].  Stated to her that I will check with Verlon Au who is working in [place] today to see if there are any Stiolto samples there and she verbalized understanding.  Routing to [place]. Please advise.

## 2020-05-05 ENCOUNTER — Telehealth: Payer: Self-pay | Admitting: Orthopaedic Surgery

## 2020-05-05 ENCOUNTER — Other Ambulatory Visit: Payer: Self-pay | Admitting: Orthopaedic Surgery

## 2020-05-05 MED ORDER — OXYCODONE-ACETAMINOPHEN 5-325 MG PO TABS: 1.0000 | ORAL_TABLET | Freq: Four times a day (QID) | ORAL | 0 refills | Status: DC | PRN

## 2020-05-05 MED ORDER — GABAPENTIN 100 MG PO CAPS: 300.0000 mg | ORAL_CAPSULE | Freq: Every day | ORAL | 0 refills | Status: DC

## 2020-05-05 NOTE — Telephone Encounter (Signed)
Patient called needing Rx refilled Gabapentin and Oxycodone. The number to contact patient is [phone]

## 2020-05-05 NOTE — Telephone Encounter (Signed)
Right THA 04/15/20

## 2020-05-16 ENCOUNTER — Other Ambulatory Visit: Payer: Self-pay | Admitting: Orthopaedic Surgery

## 2020-05-16 ENCOUNTER — Telehealth: Payer: Self-pay | Admitting: Orthopaedic Surgery

## 2020-05-16 MED ORDER — OXYCODONE-ACETAMINOPHEN 5-325 MG PO TABS
1.0000 | ORAL_TABLET | Freq: Three times a day (TID) | ORAL | 0 refills | Status: DC | PRN
Start: 2020-05-16 — End: 2020-05-30

## 2020-05-16 NOTE — Telephone Encounter (Signed)
Pt requesting medication refill on percocet

## 2020-05-30 ENCOUNTER — Ambulatory Visit (INDEPENDENT_AMBULATORY_CARE_PROVIDER_SITE_OTHER): Payer: 59 | Admitting: Orthopaedic Surgery

## 2020-05-30 ENCOUNTER — Encounter: Payer: Self-pay | Admitting: Orthopaedic Surgery

## 2020-05-30 DIAGNOSIS — Z96641 Presence of right artificial hip joint: Secondary | ICD-10-CM

## 2020-05-30 DIAGNOSIS — M87052 Idiopathic aseptic necrosis of left femur: Secondary | ICD-10-CM

## 2020-05-30 MED ORDER — OXYCODONE-ACETAMINOPHEN 5-325 MG PO TABS: 1.0000 | ORAL_TABLET | Freq: Three times a day (TID) | ORAL | 0 refills | Status: DC | PRN

## 2020-05-30 NOTE — Progress Notes (Signed)
The patient comes in today 6 weeks status post a right total hip arthroplasty secondary to avascular necrosis.  She has known AVN of her left hip and is in need of a hip replacement her left hip when she gets over the right hip.  She is having pain that is appropriate at 6 weeks postop.  She is on home O2 chronically and is on chronic Eliquis.  She is requesting a pain medication refill today which is appropriate as well.  She is ambulate with a cane.  I can put her right operative hip through internal and external rotation with some discomfort.  Her left hip moves smoothly but also has significant pain secondary to AVN.  We will see her back in 4 weeks to see how she is doing overall with the right hip and can hopefully work on scheduling her for a left hip replacement after that standpoint.  All questions and concerns were answered and addressed.

## 2020-06-02 ENCOUNTER — Ambulatory Visit: Payer: 59 | Admitting: Internal Medicine

## 2020-06-02 ENCOUNTER — Other Ambulatory Visit: Payer: Self-pay

## 2020-06-02 ENCOUNTER — Encounter: Payer: Self-pay | Admitting: Internal Medicine

## 2020-06-02 DIAGNOSIS — J9611 Chronic respiratory failure with hypoxia: Secondary | ICD-10-CM | POA: Diagnosis not present

## 2020-06-02 DIAGNOSIS — J449 Chronic obstructive pulmonary disease, unspecified: Secondary | ICD-10-CM

## 2020-06-02 MED ORDER — STIOLTO RESPIMAT 2.5-2.5 MCG/ACT IN AERS: 2.0000 | INHALATION_SPRAY | Freq: Every day | RESPIRATORY_TRACT | 0 refills | Status: DC

## 2020-06-02 NOTE — Patient Instructions (Signed)
No change on medications   I would Dr Ellouise Newer office to ask what insurance works the best for [employer] or whatever they recommend you start on   Please schedule a follow up visit in 3 months but call sooner if needed

## 2020-06-02 NOTE — Assessment & Plan Note (Signed)
Quit smoking 2009  - IgE 05/27/2018   3590 with Eosinophil count 800/mcL > [place]  - PFT's 07/29/2019  FEV1 2.25 (79 % ) ratio 0.70  p 27 % improvement from saba p 0 prior to study with DLCO  14.30 (65%) corrects to 3.87 (91%)  for alv volume and FV curve min curvature   - 11/17/2019    try stiolto x 6 weeks and pred 20  Until better then 10 mg daily plus maint singulair  - Labs ordered 03/07/2020   alpha one AT phenotype  MM - 03/06/20 IgE  1115  - 06/02/2020  After extensive coaching inhaler device,  effectiveness =    90% with [facility]     Limited options based on funds at this point. The goal with a chronic steroid dependent illness is always arriving at the lowest effective dose that controls the disease/symptoms and not accepting a set "formula" which is based on statistics or guidelines that don't always take into account patient  variability or the natural hx of the dz in every individual patient, which may well vary over time.  For now therefore I recommend the patient maintain  20 mg ceiling and 10 mg floor for prednisone until get started on biologics.

## 2020-06-02 NOTE — Assessment & Plan Note (Addendum)
11/17/2019   Walked 4lpm her POC  approx   300 ft  @ slow/[place] gate  stopped due to  Sob and R hip and leg pain with sats still 98%     As of 06/02/2020  rec 3lpm 24/7 though ok to tritrate on portable 02 to sats >  90%   Adequate control on present rx, reviewed in detail with pt > no change in rx needed           Each maintenance medication was reviewed in detail including emphasizing most importantly the difference between maintenance and prns and under what circumstances the prns are to be triggered using an action plan format where appropriate.  Total time for H and P, chart review, counseling, reviewing hfa  device(s) and generating customized AVS unique to this office visit / same day charting = 22 min

## 2020-06-02 NOTE — Progress Notes (Signed)
@Patient  ID: , female    DOB: 19-Jul-1965     MRN: [id number]    Referring provider: [id number], MD  Brief patient profile:  53 yowf  Quit smoking 2009  previously  followed for COPD GOLD II (but really more of an AB /eosinophilic phenotype with pfts nearly nl p saba), dx of chronic hypoxic respiratory failure on oxygen at 4 L and chronic rhinitis Medical history significant for GERD  Tension Pneumothorax 12/2001 s/p VATS w/ mechanical pleurodesis on right -Dr. 01/2002    TEST/EVENTS :  IgE 05/27/2018   3590 with Eosinophil count 800/mcL > 05/29/2018   07/29/2019 Follow-up : COPD, oxygen dependent respiratory failure, asthma, chronic rhinitis Patient returns for a 37-month follow-up.  Patient has underlying moderate COPD with allergic asthma (eosinophilic phenotype).  Last visit she continued to have increased symptom burden.  She was continued on DuoNeb 4 times daily.  Budesonide twice daily.  Last visit she was started on prednisone 10 mg daily .  She says she does feel that this has helped with her breathing.  She still gets very winded with minimal activity.  Patient had PFTs show stable lung function since 2019.  Patient has moderate airflow obstruction and restriction with FEV1 at 62%, ratio 66, FVC 74%, significant bronchodilator response with positive reversibility, 27% change, significant mid flow reversibility (137% change), DLCO improved at 65%. Patient remains on oxygen at 4 L.  Previous chest x-ray November 2020 showed clear lungs.  He denies any increased cough congestion or wheezing.  Patient is on disability which was approved and March 2021 unfortunately she does not have insurance.  She can afford her current nebulizer regimen. Is her medications through the good Rx rec Continue on Duoneb Four times a day   Continue on Budesonide Neb Twice daily   Continue on Prednisone 10mg  daily  Low sweet and salt diet  Activity as tolerated.  Continue on Oxygen on  4l/m    11/17/2019  Consultaton as 2nd opinion/[place]/Raydin Bielinski re: AB/ 02 dep resp failure  Chief Complaint  Patient presents with  . Consult    Shortness of breath with exertion,   Dyspnea:  Doesn't do grocery shopping x sev years  [place] time walked was park [place] "before got  Hot" on 4lpm and sats 93% she reports while walking  Cough: esp in am / gray  Sleeping: ativan at hs / on side bed is flat/ 2 pillows / 3 x weekly wakes up feeling need for more saba SABA use: "all the time"  02: 4lpm 24/7 though POC at 4 pulsed outside home  Prednisone since jan 2021 but out x sev weeks and much worse even on bud neb rec Make sure you check your oxygen saturations at highest level of activity to be sure it stays over 90% and adjust upward to maintain this level if needed but remember to turn it back to previous settings when you stop (to conserve your supply).  Stop budesonide and duoneb  Plan A = Automatic = Always=    Stiolto 2 puffs first each am  Prednisone 10 mg 2 until better then 1 daily  Singulair 10 mg each pm  Plan B = Backup (to supplement plan A, not to replace it) Only use your albuterol inhaler as a rescue medication  Plan C = Crisis (instead of Plan B but only if Plan B stops working) - only use your albuterol nebulizer if you first try Plan B and  it fails to help > ok to use the nebulizer up to every 4 hours but if start needing it regularly call for immediate appointment   01/13/2020  f/u ov/[place] office/Scot Shiraishi re: AB / stiolto and pred 10 mg daily  Chief Complaint  Patient presents with  . Follow-up    shortness of breath with activity  Dyspnea: limited by R > L hip not sob Cough: minimal > mucoid Sleeping: bed is flat several pillows  SABA use: less ventolin but less active / rarely neb  02: 4lpm 24/7  Likes stiolto but can't afford on her insurance and has also tried Theme park manager trelegy and breztri due to either cost or upper airway side effects rec When you  get medicare, strongly advise you to get medicare with commercial supplement  Plan A = Automatic = Always=   General  symbicort 80 Take 2 puffs first thing in am and then another 2 puffs about 12 hours later.  (or stiolto 2 puffs each am but not both)  Work on inhaler technique: Ok to go back to [employer]  like you were before as your maintenance  Plan B = Backup (to supplement plan A, not to replace it) Only use your albuterol inhaler as a rescue medication to be used if you can't catch your breath by resting or doing a relaxed purse lip breathing pattern.  - The less you use it, the better it will work when you need it. - Ok to use the inhaler up to 2 puffs  every 4 hours if you must but call for appointment if use goes up over your usual need - Don't leave home without it !!  (think of it like the spare tire for your car)  Plan C = Crisis (instead of Plan B but only if Plan B stops working) - only use your albuterol nebulizer if you first try Plan B and it fails to help > ok to use the nebulizer up to every 4 hours but if start needing it regularly call for immediate appointment Plan D = Deltasone If doing worse despite ABC then try taking prednisone 2 until better then hold at 10 mg daily for a week then try 5mg        03/07/2020  f/u ov/[place] office/Armanie Ullmer re: AB stiolto /pred 10 mg one whole, never able to try one half  Chief Complaint  Patient presents with  . Follow-up    Patient needs surgical clearance for hip surgery, wears 4 liters oxygen all the time. Wants samples of stiolto   Dyspnea: stops walking walking 50 ft hip > breathing stops her Cough: none Sleeping: bed is flat, neck pillow  SABA use: hfa sev times a week/ neb less but last 03/05/20  02: 4lpm not checking sats   rec Symbicort or stiolto daily but not both Work on inhaler technique: When breathing is bad take prednisone 20 mg daily and 10 mg daily otherwise - take all of it first thing in am  Please  remember to go to the lab department @ [facility] for your tests - we will call you with the results when they are available.    06/02/2020  f/u ov/[place] office/Santhiago Collingsworth re: AB stiolto samples plus pred 10 mg daily as can't afford nucala which is what Dr 06/04/2020 rec  Chief Complaint  Patient presents with  . Follow-up    Breathing is overall doing well. She states that her cough has been "raspy"- prod in the am  with clear to yellow sputum.  She is using her albuterol inhaler 4-5 x per wk and she has not used neb.   Dyspnea:  Just started back walking p Apr 15 2020 THR  R did fine and planning L side next  Cough: usual am cough  Sleeping: flat bed neck pillow does fine  SABA use: as above  02: 3 lpm 24/7  Covid status: vax x 3  Lung cancer screening: n/a   No obvious day to day or daytime variability or assoc excess/ purulent sputum or mucus plugs or hemoptysis or cp or chest tightness, subjective wheeze or overt sinus or hb symptoms.   Sleeping as above  without nocturnal  or early am exacerbation  of respiratory  c/o's or need for noct saba. Also denies any obvious fluctuation of symptoms with weather or environmental changes or other aggravating or alleviating factors except as outlined above   No unusual exposure hx or h/o childhood pna/ asthma or knowledge of premature birth.  Current Allergies, Complete Past Medical History, Past Surgical History, Family History, and Social History were reviewed in [facility] electronic medical record.  ROS  The following are not active complaints unless bolded Hoarseness, sore throat, dysphagia, dental problems, itching, sneezing,  nasal congestion or discharge of excess mucus or purulent secretions, ear ache,   fever, chills, sweats, unintended wt loss or wt gain, classically pleuritic or exertional cp,  orthopnea pnd or arm/hand swelling  or leg swelling, presyncope, palpitations, abdominal pain, anorexia, nausea, vomiting, diarrhea   or change in bowel habits or change in bladder habits, change in stools or change in urine, dysuria, hematuria,  rash, arthralgias, visual complaints, headache, numbness, weakness or ataxia or problems with walking or coordination,  change in mood or  memory.        Current Meds  Medication Sig  . acetaminophen (TYLENOL) 500 MG tablet Take 1,000 mg by mouth every 6 (six) hours as needed for moderate pain.  Marland Kitchen. albuterol (PROVENTIL) (2.5 MG/3ML) 0.083% nebulizer solution Take 3 mLs (2.5 mg total) by nebulization every 4 (four) hours as needed for wheezing or shortness of breath.  Marland Kitchen. albuterol (VENTOLIN HFA) 108 (90 Base) MCG/ACT inhaler Inhale 2 puffs into the lungs every 4 (four) hours as needed for shortness of breath.   Marland Kitchen. aspirin-acetaminophen-caffeine (EXCEDRIN MIGRAINE) 250-250-65 MG tablet Take 2 tablets by mouth daily.  Marland Kitchen. bismuth subsalicylate (PEPTO BISMOL) 262 MG chewable tablet Chew 524 mg by mouth as needed for indigestion.  . calcium carbonate (TUMS EX) 750 MG chewable tablet Chew 2 tablets by mouth in the morning, at noon, in the evening, and at bedtime.  . cetirizine (ZYRTEC) 10 MG tablet Take 10 mg by mouth daily.  . citalopram (CELEXA) 40 MG tablet Take 40 mg by mouth at bedtime.  . docusate sodium (COLACE) 100 MG capsule Take 100-200 mg by mouth daily as needed for mild constipation.  . gabapentin (NEURONTIN) 100 MG capsule Take 3 capsules (300 mg total) by mouth at bedtime.  Marland Kitchen. levothyroxine (SYNTHROID) 50 MCG tablet Take 50 mcg by mouth daily before breakfast.  . LORazepam (ATIVAN) 1 MG tablet Take 1 mg by mouth See admin instructions. Take 1 mg at bedtime, may take a second 1 mg dose during the day as needed for anxiety  . metaxalone (SKELAXIN) 400 MG tablet Take 400 mg by mouth 3 (three) times daily as needed (muscle spasms).  . montelukast (SINGULAIR) 10 MG tablet Take 1 tablet (10 mg total) by mouth  at bedtime.  Marland Kitchen oxyCODONE-acetaminophen (PERCOCET/ROXICET) 5-325 MG tablet Take 1  tablet by mouth 3 (three) times daily as needed for severe pain.  . pantoprazole (PROTONIX) 40 MG tablet Take 30- 60 min before your first and last meals of the day (Patient taking differently: Take 40 mg by mouth 2 (two) times daily. Take 30- 60 min before your first and last meals of the day)  . pravastatin (PRAVACHOL) 40 MG tablet Take 40 mg by mouth at bedtime.  . predniSONE (DELTASONE) 10 MG tablet Take 2 tablets (20 mg total) by mouth daily. (Patient taking differently: Take 10 mg by mouth daily.)  . SUMAtriptan 6 MG/0.5ML SOAJ Inject 6 mg as directed daily as needed (migraine).   . Tiotropium Bromide-Olodaterol (STIOLTO RESPIMAT) 2.5-2.5 MCG/ACT AERS Inhale 2 puffs into the lungs daily.  . valACYclovir (VALTREX) 500 MG tablet Take 500-2,000 mg by mouth See admin instructions. Take 2000mg s at first sign of fever blister outbreak then take 500mg s daily until gone                                       Past Medical History:  Diagnosis Date  . Anxiety   . Asthma   . Cervical radiculopathy   . Cluster headaches   . COPD (chronic obstructive pulmonary disease) (HCC)   . COPD (chronic obstructive pulmonary disease) with chronic bronchitis (HCC)   . GERD (gastroesophageal reflux disease)   . Hyperlipemia   . Hypoglycemia   . Hypothyroidism   . Migraine   . Pneumothorax   . Rectal discharge 07/28/2010  . Shortness of breath   . Sleep apnea    cannot tolerate-not using CPAP  . Sluggishness 07/28/2010        Physical Exam  06/02/2020       149  03/07/2020     144  01/13/2020       145   11/17/19 146 lb 12.8 oz (66.6 kg)  09/15/19 149 lb 6.4 oz (67.8 kg)  07/29/19 151 lb (68.5 kg)    Vital signs reviewed  06/02/2020  - Note at rest 02 sats  99% on RA   General appearance:    Chronically ill amb wf nad > stated age in appearance    HEENT : pt wearing mask not removed for exam due to covid - 19 concerns.   NECK :  without JVD/Nodes/TM/ nl carotid upstrokes  bilaterally   LUNGS: no acc muscle use,  Min barrel  contour chest wall with bilateral  slightly decreased bs s audible wheeze and  without cough on insp or exp maneuvers and min  Hyperresonant  to  percussion bilaterally     CV:  RRR  no s3 or murmur or increase in P2, and no edema   ABD:  soft and nontender with pos end  insp Hoover's  in the supine position. No bruits or organomegaly appreciated, bowel sounds nl  MS:   Nl gait/  ext warm without deformities, calf tenderness, cyanosis or clubbing No obvious joint restrictions   SKIN: warm and dry without lesions    NEURO:  alert, approp, nl sensorium with  no motor or cerebellar deficits apparent.             Assessment & Plan:

## 2020-06-27 ENCOUNTER — Encounter: Payer: Self-pay | Admitting: Orthopaedic Surgery

## 2020-06-27 ENCOUNTER — Ambulatory Visit (INDEPENDENT_AMBULATORY_CARE_PROVIDER_SITE_OTHER): Payer: 59 | Admitting: Orthopaedic Surgery

## 2020-06-27 DIAGNOSIS — Z96641 Presence of right artificial hip joint: Secondary | ICD-10-CM

## 2020-06-27 DIAGNOSIS — M87052 Idiopathic aseptic necrosis of left femur: Secondary | ICD-10-CM

## 2020-06-27 MED ORDER — GABAPENTIN 100 MG PO CAPS: 300.0000 mg | ORAL_CAPSULE | Freq: Every day | ORAL | 0 refills | Status: DC

## 2020-06-27 NOTE — Telephone Encounter (Signed)
Pt is calling in regarding my chart message . She is needing 4 new up to date inhalers . Please advise

## 2020-06-27 NOTE — Progress Notes (Signed)
The patient is now about 10 weeks status post a right total hip arthroplasty secondary to avascular necrosis of the right hip.  She is hip is doing well.  She does have known avascular process of the left hip and she does have daily left hip pain with pain in the groin and pain with weightbearing and activities.  This is also been confirmed with MRI of the left hip and plain comes to the left hip.  She has done so with her right hip that she is ready to proceed with left total hip arthroplasty surgery and this is warranted giving her advanced AVN with the left hip combined with impending femoral head collapse.  Her right operative hip moves smoothly and fluidly with no pain.  The left hip has pain with rotation and compression.  Previous imaging studies do confirm severe AVN of the left hip.  She is having still some nerve symptoms so we are going to continue her Neurontin.  We will work on scheduling her for a left total hip arthroplasty in the near future.  Having had this before she is fully aware of the risk and benefits of the surgery and what is involved.  Our surgery scheduler will be in touch

## 2020-06-28 MED ORDER — ALBUTEROL SULFATE HFA 108 (90 BASE) MCG/ACT IN AERS: 2.0000 | INHALATION_SPRAY | RESPIRATORY_TRACT | 0 refills | Status: DC | PRN

## 2020-07-06 ENCOUNTER — Other Ambulatory Visit: Payer: Self-pay

## 2020-07-07 ENCOUNTER — Other Ambulatory Visit: Payer: Self-pay | Admitting: Physician Assistant

## 2020-07-12 NOTE — Patient Instructions (Addendum)
DUE TO COVID-19 ONLY ONE VISITOR IS ALLOWED TO COME WITH YOU AND STAY IN THE WAITING ROOM ONLY DURING PRE OP AND PROCEDURE DAY OF SURGERY. THE 1 VISITOR  MAY VISIT WITH YOU AFTER SURGERY IN YOUR PRIVATE ROOM DURING VISITING HOURS ONLY!  YOU NEED TO HAVE A COVID 19 TEST ON: 07/19/20 @10 :00 AM , THIS TEST MUST BE DONE BEFORE SURGERY,  COVID TESTING SITE [address] [place] , IT IS ON THE RIGHT GOING OUT [address] APPROXIMATELY  2 MINUTES PAST [employer] ON THE RIGHT. ONCE YOUR COVID TEST IS COMPLETED,  PLEASE BEGIN THE QUARANTINE INSTRUCTIONS AS OUTLINED IN YOUR HANDOUT.                [name]    Your procedure is scheduled on: 07/22/20   Report to [facility] Main  Entrance   Report to short stay at: 5:15 AM     Call this number if you have problems the morning of surgery [phone]    Remember:  NO SOLID FOOD AFTER MIDNIGHT THE NIGHT PRIOR TO SURGERY. NOTHING BY MOUTH EXCEPT CLEAR LIQUIDS UNTIL: 4:15 AM . PLEASE FINISH ENSURE DRINK PER SURGEON ORDER  WHICH NEEDS TO BE COMPLETED AT: 4:15 AM .  CLEAR LIQUID DIET  Foods Allowed                                                                     Foods Excluded  Coffee and tea, regular and decaf                             liquids that you cannot  Plain Jell-O any favor except red or purple                                           see through such as: Fruit ices (not with fruit pulp)                                     milk, soups, orange juice  Iced Popsicles                                    All solid food Carbonated beverages, regular and diet                                    Cranberry, grape and apple juices Sports drinks like Gatorade Lightly seasoned clear broth or consume(fat free) Sugar, honey syrup  Sample Menu Breakfast                                Lunch  Supper Cranberry juice                    Beef broth                             Chicken broth Jell-O                                     Grape juice                           Apple juice Coffee or tea                        Jell-O                                      Popsicle                                                Coffee or tea                        Coffee or tea  _____________________________________________________________________  BRUSH YOUR TEETH MORNING OF SURGERY AND RINSE YOUR MOUTH OUT, NO CHEWING GUM CANDY OR MINTS.    Take these medicines the morning of surgery with A SIP OF WATER: pantoprazole,prednisone,levothyroxine.Inhalers as usual.Famotidine and lorazepam as needed.                               You may not have any metal on your body including hair pins and              piercings  Do not wear jewelry, make-up, lotions, powders or perfumes, deodorant             Do not wear nail polish on your fingernails.  Do not shave  48 hours prior to surgery.    Do not bring valuables to the hospital. [facility] IS NOT             RESPONSIBLE   FOR VALUABLES.  Contacts, dentures or bridgework may not be worn into surgery.  Leave suitcase in the car. After surgery it may be brought to your room.     Patients discharged the day of surgery will not be allowed to drive home. IF YOU ARE HAVING SURGERY AND GOING HOME THE SAME DAY, YOU MUST HAVE AN ADULT TO DRIVE YOU HOME AND BE WITH YOU FOR 24 HOURS. YOU MAY GO HOME BY TAXI OR UBER OR ORTHERWISE, BUT AN ADULT MUST ACCOMPANY YOU HOME AND STAY WITH YOU FOR 24 HOURS.  Name and phone number of your driver:  Special Instructions: N/A              Please read over the following fact sheets you were given: _____________________________________________________________________          [facility] - Preparing for Surgery Before surgery, you can play an important role.  Because skin is not sterile, your skin needs to be as free of  germs as possible.  You can reduce the number of germs on your skin by washing  with CHG (chlorahexidine gluconate) soap before surgery.  CHG is an antiseptic cleaner which kills germs and bonds with the skin to continue killing germs even after washing. Please DO NOT use if you have an allergy to CHG or antibacterial soaps.  If your skin becomes reddened/irritated stop using the CHG and inform your nurse when you arrive at Short Stay. Do not shave (including legs and underarms) for at least 48 hours prior to the first CHG shower.  You may shave your face/neck. Please follow these instructions carefully:  1.  Shower with CHG Soap the night before surgery and the  morning of Surgery.  2.  If you choose to wash your hair, wash your hair first as usual with your  normal  shampoo.  3.  After you shampoo, rinse your hair and body thoroughly to remove the  shampoo.                           4.  Use CHG as you would any other liquid soap.  You can apply chg directly  to the skin and wash                       Gently with a scrungie or clean washcloth.  5.  Apply the CHG Soap to your body ONLY FROM THE NECK DOWN.   Do not use on face/ open                           Wound or open sores. Avoid contact with eyes, ears mouth and genitals (private parts).                       Wash face,  Genitals (private parts) with your normal soap.             6.  Wash thoroughly, paying special attention to the area where your surgery  will be performed.  7.  Thoroughly rinse your body with warm water from the neck down.  8.  DO NOT shower/wash with your normal soap after using and rinsing off  the CHG Soap.                9.  Pat yourself dry with a clean towel.            10.  Wear clean pajamas.            11.  Place clean sheets on your bed the night of your first shower and do not  sleep with pets. Day of Surgery : Do not apply any lotions/deodorants the morning of surgery.  Please wear clean clothes to the hospital/surgery center.  FAILURE TO FOLLOW THESE INSTRUCTIONS MAY RESULT IN THE  CANCELLATION OF YOUR SURGERY PATIENT SIGNATURE_________________________________  NURSE SIGNATURE__________________________________  ________________________________________________________________________   Rogelia MireIncentive Spirometer  An incentive spirometer is a tool that can help keep your lungs clear and active. This tool measures how well you are filling your lungs with each breath. Taking long deep breaths may help reverse or decrease the chance of developing breathing (pulmonary) problems (especially infection) following:  A long period of time when you are unable to move or be active. BEFORE THE PROCEDURE   If the spirometer includes an indicator to show your best effort, your nurse  or respiratory therapist will set it to a desired goal.  If possible, sit up straight or lean slightly forward. Try not to slouch.  Hold the incentive spirometer in an upright position. INSTRUCTIONS FOR USE  1. Sit on the edge of your bed if possible, or sit up as far as you can in bed or on a chair. 2. Hold the incentive spirometer in an upright position. 3. Breathe out normally. 4. Place the mouthpiece in your mouth and seal your lips tightly around it. 5. Breathe in slowly and as deeply as possible, raising the piston or the ball toward the top of the column. 6. Hold your breath for 3-5 seconds or for as long as possible. Allow the piston or ball to fall to the bottom of the column. 7. Remove the mouthpiece from your mouth and breathe out normally. 8. Rest for a few seconds and repeat Steps 1 through 7 at least 10 times every 1-2 hours when you are awake. Take your time and take a few normal breaths between deep breaths. 9. The spirometer may include an indicator to show your best effort. Use the indicator as a goal to work toward during each repetition. 10. After each set of 10 deep breaths, practice coughing to be sure your lungs are clear. If you have an incision (the cut made at the time of surgery),  support your incision when coughing by placing a pillow or rolled up towels firmly against it. Once you are able to get out of bed, walk around indoors and cough well. You may stop using the incentive spirometer when instructed by your caregiver.  RISKS AND COMPLICATIONS  Take your time so you do not get dizzy or light-headed.  If you are in pain, you may need to take or ask for pain medication before doing incentive spirometry. It is harder to take a deep breath if you are having pain. AFTER USE  Rest and breathe slowly and easily.  It can be helpful to keep track of a log of your progress. Your caregiver can provide you with a simple table to help with this. If you are using the spirometer at home, follow these instructions: SEEK MEDICAL CARE IF:   You are having difficultly using the spirometer.  You have trouble using the spirometer as often as instructed.  Your pain medication is not giving enough relief while using the spirometer.  You develop fever of 100.5 F (38.1 C) or higher. SEEK IMMEDIATE MEDICAL CARE IF:   You cough up bloody sputum that had not been present before.  You develop fever of 102 F (38.9 C) or greater.  You develop worsening pain at or near the incision site. MAKE SURE YOU:   Understand these instructions.  Will watch your condition.  Will get help right away if you are not doing well or get worse. Document Released: 08/06/2006 Document Revised: 06/18/2011 Document Reviewed: 10/07/2006 [facility] Patient Information 2014 [place], [place].   ________________________________________________________________________

## 2020-07-13 ENCOUNTER — Encounter (HOSPITAL_COMMUNITY)
Admission: RE | Admit: 2020-07-13 | Discharge: 2020-07-13 | Disposition: A | Discharge status: Home / Self Care | Payer: 59 | Source: Ambulatory Visit | Attending: Orthopaedic Surgery | Admitting: Orthopaedic Surgery

## 2020-07-13 ENCOUNTER — Encounter (HOSPITAL_COMMUNITY): Payer: Self-pay

## 2020-07-13 ENCOUNTER — Other Ambulatory Visit: Payer: Self-pay

## 2020-07-13 DIAGNOSIS — Z01812 Encounter for preprocedural laboratory examination: Secondary | ICD-10-CM | POA: Insufficient documentation

## 2020-07-13 HISTORY — DX: Unspecified osteoarthritis, unspecified site: M19.90

## 2020-07-13 HISTORY — DX: Anemia, unspecified: D64.9

## 2020-07-13 LAB — CBC
HCT: 37.5 % (ref 36.0–46.0)
Hemoglobin: 11.6 g/dL — ABNORMAL LOW (ref 12.0–15.0)
MCH: 28.7 pg (ref 26.0–34.0)
MCHC: 30.9 g/dL (ref 30.0–36.0)
MCV: 92.8 fL (ref 80.0–100.0)
Platelets: 356 10*3/uL (ref 150–400)
RBC: 4.04 MIL/uL (ref 3.87–5.11)
RDW: 13.6 % (ref 11.5–15.5)
WBC: 14 10*3/uL — ABNORMAL HIGH (ref 4.0–10.5)
nRBC: 0 % (ref 0.0–0.2)

## 2020-07-13 LAB — SURGICAL PCR SCREEN
MRSA, PCR: NEGATIVE
Staphylococcus aureus: NEGATIVE

## 2020-07-13 LAB — HEMOGLOBIN A1C
Hgb A1c MFr Bld: 6 % — ABNORMAL HIGH (ref 4.8–5.6)
Mean Plasma Glucose: 125.5 mg/dL

## 2020-07-13 NOTE — Progress Notes (Signed)
COVID Vaccine Completed: Yes Date COVID Vaccine completed: 03/09/20 COVID vaccine manufacturer: Pfizer     PCP - Dr. Nita Sells Cardiologist - Dr. Dina Rich  Chest x-ray -  EKG - 09/17/19 Stress Test -  ECHO - 08/11/19 Cardiac Cath -  Pacemaker/ICD device last checked:  Sleep Study - Yes CPAP - NO  Fasting Blood Sugar -  Checks Blood Sugar _____ times a day  Blood Thinner Instructions: Aspirin Instructions: Last Dose:  Anesthesia review: Hx: OSA(No CPAP),COPD(3 L O2 continuous).  Patient denies shortness of breath, fever, cough and chest pain at PAT appointment   Patient verbalized understanding of instructions that were given to them at the PAT appointment. Patient was also instructed that they will need to review over the PAT instructions again at home before surgery.

## 2020-07-19 ENCOUNTER — Other Ambulatory Visit (HOSPITAL_COMMUNITY)
Admission: RE | Admit: 2020-07-19 | Discharge: 2020-07-19 | Disposition: A | Discharge status: Home / Self Care | Payer: 59 | Source: Ambulatory Visit | Attending: Orthopaedic Surgery | Admitting: Orthopaedic Surgery

## 2020-07-19 DIAGNOSIS — Z20822 Contact with and (suspected) exposure to covid-19: Secondary | ICD-10-CM | POA: Insufficient documentation

## 2020-07-19 DIAGNOSIS — Z01812 Encounter for preprocedural laboratory examination: Secondary | ICD-10-CM | POA: Insufficient documentation

## 2020-07-19 LAB — SARS CORONAVIRUS 2 (TAT 6-24 HRS): SARS Coronavirus 2: NEGATIVE

## 2020-07-21 NOTE — H&P (Signed)
TOTAL HIP ADMISSION H&P  Patient is admitted for left total hip arthroplasty.  Subjective:  Chief Complaint: left hip pain  HPI: [name], 55 y.o. female, has a history of pain and functional disability in the left hip(s) due to avascular necrosis and patient has failed non-surgical conservative treatments for greater than 12 weeks to include NSAID's and/or analgesics, use of assistive devices and activity modification.  Onset of symptoms was abrupt starting 1 years ago with gradually worsening course since that time.The patient noted no past surgery on the left hip(s).  Patient currently rates pain in the left hip at 10 out of 10 with activity. Patient has night pain, worsening of pain with activity and weight bearing, pain that interfers with activities of daily living and pain with passive range of motion. Patient has evidence of subchondral cysts by imaging studies. This condition presents safety issues increasing the risk of falls.  There is no current active infection.  Patient Active Problem List   Diagnosis Date Noted  . Status post right hip replacement 05/02/2020  . Status post total replacement of right hip 04/15/2020  . Avascular necrosis of bone of left hip (HCC) 02/10/2020  . Avascular necrosis of bone of right hip (HCC) 02/10/2020  . Allergic rhinitis 05/21/2019  . Chronic asthmatic bronchitis (HCC) 04/30/2019  . Chronic respiratory failure with hypoxia (HCC) 04/30/2019  . Spinal stenosis of cervical region 04/05/2017  . Partial tear of right subscapularis tendon   . Incisional hernia, without obstruction or gangrene   . Urinary frequency 03/21/2012  . Migraine equivalent syndrome 03/21/2012  . Insomnia 03/21/2012   Past Medical History:  Diagnosis Date  . Anemia   . Anxiety   . Arthritis   . Asthma   . Cervical radiculopathy   . Cluster headaches   . COPD (chronic obstructive pulmonary disease) (HCC)   . COPD (chronic obstructive pulmonary disease) with  chronic bronchitis (HCC)   . GERD (gastroesophageal reflux disease)   . Hyperlipemia   . Hypoglycemia   . Hypothyroidism   . Migraine   . Pneumothorax   . Pre-diabetes   . Rectal discharge 07/28/2010  . Shortness of breath   . Sleep apnea    cannot tolerate-not using CPAP  . Sluggishness 07/28/2010    Past Surgical History:  Procedure Laterality Date  . ABDOMINAL HYSTERECTOMY     with bladder suspension  . CERVICAL FUSION    . CHOLECYSTECTOMY    . COLONOSCOPY N/A 07/30/2013   Procedure: COLONOSCOPY;  Surgeon: Malissa HippoNajeeb U Rehman, MD;  Location: [facility];  Service: Endoscopy;  Laterality: N/A;  930  . EXAM UNDER ANESTHESIA WITH MANIPULATION OF SHOULDER Right 10/11/2016   Procedure: EXAM UNDER ANESTHESIA WITH MANIPULATION OF SHOULDER;  Surgeon: Vickki HearingHarrison, Stanley E, MD;  Location: [facility];  Service: Orthopedics;  Laterality: Right;  . HERNIA REPAIR    . INCISIONAL HERNIA REPAIR N/A 07/30/2016   Procedure: HERNIA REPAIR INCISIONAL WITH MESH;  Surgeon: Franky MachoMark Jenkins, MD;  Location: [facility];  Service: General;  Laterality: N/A;  . LOBECTOMY     right lung   . POSTERIOR CERVICAL LAMINECTOMY Right 04/05/2017   Procedure: Right C6-7 Posterior cervical laminectomy;  Surgeon: Donalee Citrinram, Gary, MD;  [facility];  Service: Neurosurgery;  Laterality: Right;  Right C6-7 Posterior cervical laminectomy  . SHOULDER ARTHROSCOPY WITH ROTATOR CUFF REPAIR Right 10/11/2016   Procedure: SHOULDER ARTHROSCOPY;  Surgeon: Vickki HearingHarrison, Stanley E, MD;  Location: [facility];  Service: Orthopedics;  Laterality: Right;  .  TOTAL HIP ARTHROPLASTY Right 04/15/2020   Procedure: RIGHT TOTAL HIP ARTHROPLASTY ANTERIOR APPROACH;  Surgeon: Kathryne Hitch, MD;  Location: [facility];  Service: Orthopedics;  Laterality: Right;  . TUBAL LIGATION      No current facility-administered medications for this encounter.   Current Outpatient Medications  Medication Sig Dispense Refill Last Dose  . acetaminophen (TYLENOL) 500 MG tablet Take  1,000 mg by mouth every 6 (six) hours as needed for moderate pain.     Marland Kitchen albuterol (PROVENTIL) (2.5 MG/3ML) 0.083% nebulizer solution Take 3 mLs (2.5 mg total) by nebulization every 4 (four) hours as needed for wheezing or shortness of breath. 75 mL 12   . albuterol (VENTOLIN HFA) 108 (90 Base) MCG/ACT inhaler Inhale 2 puffs into the lungs every 4 (four) hours as needed for shortness of breath. 3 each 0   . aspirin-acetaminophen-caffeine (EXCEDRIN MIGRAINE) 250-250-65 MG tablet Take 2 tablets by mouth every 8 (eight) hours as needed for migraine or headache.     . bismuth subsalicylate (PEPTO BISMOL) 262 MG chewable tablet Chew 524 mg by mouth daily as needed for indigestion.     . calcium carbonate (TUMS EX) 750 MG chewable tablet Chew 2 tablets by mouth daily as needed for heartburn.     . cetirizine (ZYRTEC) 10 MG tablet Take 10 mg by mouth daily.     . Cholecalciferol (VITAMIN D3 SUPER STRENGTH) 50 MCG (2000 UT) CAPS Take 2,000 Units by mouth daily.     . citalopram (CELEXA) 40 MG tablet Take 40 mg by mouth at bedtime.     . docusate sodium (COLACE) 100 MG capsule Take 100-200 mg by mouth daily as needed for mild constipation.     . famotidine (PEPCID) 20 MG tablet Take 20 mg by mouth daily as needed for heartburn or indigestion.     Marland Kitchen levothyroxine (SYNTHROID) 75 MCG tablet Take 0.5 tablets (37.5 mcg total) by mouth daily before breakfast.     . LORazepam (ATIVAN) 1 MG tablet Take 1 mg by mouth See admin instructions. Take 1 mg at bedtime, may take a second 1 mg dose during the day as needed for anxiety     . montelukast (SINGULAIR) 10 MG tablet Take 1 tablet (10 mg total) by mouth at bedtime. 30 tablet 11   . pantoprazole (PROTONIX) 40 MG tablet Take 30- 60 min before your first and last meals of the day (Patient taking differently: Take 40 mg by mouth 2 (two) times daily. Take 30- 60 min before your first and last meals of the day)     . pravastatin (PRAVACHOL) 40 MG tablet Take 40 mg by mouth  at bedtime.     . predniSONE (DELTASONE) 10 MG tablet Take 2 tablets (20 mg total) by mouth daily. 90 tablet 1   . SUMAtriptan 6 MG/0.5ML SOAJ Inject 6 mg as directed daily as needed (migraine).      . Tiotropium Bromide-Olodaterol (STIOLTO RESPIMAT) 2.5-2.5 MCG/ACT AERS Inhale 2 puffs into the lungs daily. 4 g 0   . Triamcinolone Acetonide (NASACORT ALLERGY 24HR NA) Place 2 sprays into the nose daily as needed (allergies).     . valACYclovir (VALTREX) 500 MG tablet Take 500-2,000 mg by mouth See admin instructions. Take 2000mg s at first sign of fever blister outbreak then take 500mg s daily until gone     . gabapentin (NEURONTIN) 100 MG capsule Take 3 capsules (300 mg total) by mouth at bedtime. (Patient not taking: Reported on 07/06/2020) 90 capsule 0  Not Taking at Unknown time   Allergies  Allergen Reactions  . Aspirin Shortness Of Breath    Causes asthma flares   . Penicillins Other (See Comments)    UNSPECIFIED REACTION OF CHILDHOOD  Has patient had a PCN reaction causing immediate rash, facial/tongue/throat swelling, SOB or lightheadedness with hypotension: NO Has patient had a PCN reaction causing severe rash involving mucus membranes or skin necrosis: NO Has patient had a PCN reaction that required hospitalization: no Has patient had a PCN reaction occurring within the last 10 years: NO If all of the above answers are "NO", then may proceed with Cephalosporin use.   Marland Kitchen Percocet [Oxycodone-Acetaminophen]     migraines  . Tramadol     insomnia  . Vicodin [Hydrocodone-Acetaminophen] Other (See Comments)    Headaches     Social History   Tobacco Use  . Smoking status: Former Smoker    Packs/day: 1.00    Years: 30.00    Pack years: 30.00    Types: Cigarettes    Quit date: 02/21/2008    Years since quitting: 12.4  . Smokeless tobacco: Never Used  Substance Use Topics  . Alcohol use: No    Alcohol/week: 0.0 standard drinks    Comment: no    Family History  Problem Relation  Age of Onset  . COPD Mother   . Diabetes Mother   . Hyperlipidemia Mother   . Hypertension Mother   . Bipolar disorder Son   . Heart failure Maternal Grandmother   . Hypertension Maternal Grandmother   . Thyroid disease Maternal Grandmother   . Diabetes Paternal Grandmother   . Alzheimer's disease Paternal Grandmother   . Heart failure Paternal Grandfather   . Hypertension Paternal Grandfather   . Hypertension Father   . Colon cancer Neg Hx      Review of Systems  All other systems reviewed and are negative.   Objective:  Physical Exam Vitals reviewed.  Constitutional:      Appearance: Normal appearance.  HENT:     Head: Normocephalic and atraumatic.  Eyes:     Extraocular Movements: Extraocular movements intact.     Pupils: Pupils are equal, round, and reactive to light.  Cardiovascular:     Rate and Rhythm: Normal rate and regular rhythm.     Pulses: Normal pulses.  Pulmonary:     Effort: Pulmonary effort is normal.     Breath sounds: Normal breath sounds.  Abdominal:     Palpations: Abdomen is soft.  Musculoskeletal:     Cervical back: Normal range of motion.     Left hip: Tenderness and bony tenderness present. Decreased range of motion. Decreased strength.  Neurological:     Mental Status: She is alert and oriented to person, place, and time.  Psychiatric:        Behavior: Behavior normal.     Vital signs in last 24 hours:    Labs:   Estimated body mass index is 25.32 kg/m as calculated from the following:   Height as of 07/13/20: 5' 4.5" (1.638 m).   Weight as of 06/02/20: 67.9 kg.   Imaging Review Plain radiographs demonstrate severe avascular necrosis of the left hip(s). The bone quality appears to be good for age and reported activity level.      Assessment/Plan:  End stage avascular necrosis, left hip(s)  The patient history, physical examination, clinical judgement of the provider and imaging studies are consistent with avascular  necrosis of the left hip(s) and total hip  arthroplasty is deemed medically necessary. The treatment options including medical management, injection therapy, arthroscopy and arthroplasty were discussed at length. The risks and benefits of total hip arthroplasty were presented and reviewed. The risks due to aseptic loosening, infection, stiffness, dislocation/subluxation,  thromboembolic complications and other imponderables were discussed.  The patient acknowledged the explanation, agreed to proceed with the plan and consent was signed. Patient is being admitted for inpatient treatment for surgery, pain control, PT, OT, prophylactic antibiotics, VTE prophylaxis, progressive ambulation and ADL's and discharge planning.The patient is planning to be discharged home with home health services

## 2020-07-22 ENCOUNTER — Ambulatory Visit (HOSPITAL_COMMUNITY): Payer: 59

## 2020-07-22 ENCOUNTER — Other Ambulatory Visit: Payer: Self-pay

## 2020-07-22 ENCOUNTER — Observation Stay (HOSPITAL_COMMUNITY): Payer: 59

## 2020-07-22 ENCOUNTER — Observation Stay (HOSPITAL_COMMUNITY)
Admission: RE | Admit: 2020-07-22 | Discharge: 2020-07-23 | Disposition: A | Discharge status: Home / Self Care | Payer: 59 | Source: Ambulatory Visit | Attending: Orthopaedic Surgery | Admitting: Orthopaedic Surgery

## 2020-07-22 ENCOUNTER — Encounter (HOSPITAL_COMMUNITY)
Admission: RE | Disposition: A | Discharge status: Home / Self Care | Payer: Self-pay | Source: Ambulatory Visit | Attending: Orthopaedic Surgery

## 2020-07-22 ENCOUNTER — Ambulatory Visit (HOSPITAL_COMMUNITY): Payer: 59 | Admitting: Certified Registered Nurse Anesthetist

## 2020-07-22 ENCOUNTER — Encounter (HOSPITAL_COMMUNITY): Payer: Self-pay | Admitting: Orthopaedic Surgery

## 2020-07-22 DIAGNOSIS — E039 Hypothyroidism, unspecified: Secondary | ICD-10-CM | POA: Diagnosis not present

## 2020-07-22 DIAGNOSIS — K219 Gastro-esophageal reflux disease without esophagitis: Secondary | ICD-10-CM | POA: Diagnosis not present

## 2020-07-22 DIAGNOSIS — F419 Anxiety disorder, unspecified: Secondary | ICD-10-CM | POA: Diagnosis not present

## 2020-07-22 DIAGNOSIS — Z96642 Presence of left artificial hip joint: Secondary | ICD-10-CM

## 2020-07-22 DIAGNOSIS — J449 Chronic obstructive pulmonary disease, unspecified: Secondary | ICD-10-CM | POA: Insufficient documentation

## 2020-07-22 DIAGNOSIS — J45909 Unspecified asthma, uncomplicated: Secondary | ICD-10-CM | POA: Insufficient documentation

## 2020-07-22 DIAGNOSIS — D649 Anemia, unspecified: Secondary | ICD-10-CM | POA: Insufficient documentation

## 2020-07-22 DIAGNOSIS — Z79899 Other long term (current) drug therapy: Secondary | ICD-10-CM | POA: Diagnosis not present

## 2020-07-22 DIAGNOSIS — R7303 Prediabetes: Secondary | ICD-10-CM | POA: Insufficient documentation

## 2020-07-22 DIAGNOSIS — Z87891 Personal history of nicotine dependence: Secondary | ICD-10-CM | POA: Diagnosis not present

## 2020-07-22 DIAGNOSIS — M87052 Idiopathic aseptic necrosis of left femur: Principal | ICD-10-CM | POA: Insufficient documentation

## 2020-07-22 DIAGNOSIS — E785 Hyperlipidemia, unspecified: Secondary | ICD-10-CM | POA: Insufficient documentation

## 2020-07-22 DIAGNOSIS — S82122A Displaced fracture of lateral condyle of left tibia, initial encounter for closed fracture: Secondary | ICD-10-CM

## 2020-07-22 DIAGNOSIS — M25552 Pain in left hip: Secondary | ICD-10-CM | POA: Diagnosis present

## 2020-07-22 DIAGNOSIS — Z7951 Long term (current) use of inhaled steroids: Secondary | ICD-10-CM | POA: Diagnosis not present

## 2020-07-22 DIAGNOSIS — Z791 Long term (current) use of non-steroidal anti-inflammatories (NSAID): Secondary | ICD-10-CM | POA: Insufficient documentation

## 2020-07-22 HISTORY — PX: TOTAL HIP ARTHROPLASTY: SHX124

## 2020-07-22 LAB — TYPE AND SCREEN
ABO/RH(D): A NEG
Antibody Screen: NEGATIVE

## 2020-07-22 SURGERY — ARTHROPLASTY, HIP, TOTAL, ANTERIOR APPROACH
Anesthesia: Spinal | Site: Hip | Laterality: Left

## 2020-07-22 MED ORDER — ONDANSETRON HCL 4 MG PO TABS: 4.0000 mg | ORAL_TABLET | Freq: Four times a day (QID) | ORAL | Status: DC | PRN

## 2020-07-22 MED ORDER — PROPOFOL 1000 MG/100ML IV EMUL
INTRAVENOUS | Status: AC
  Filled 2020-07-22: qty 100

## 2020-07-22 MED ORDER — POLYETHYLENE GLYCOL 3350 17 G PO PACK: 17.0000 g | PACK | Freq: Every day | ORAL | Status: DC | PRN

## 2020-07-22 MED ORDER — ACETAMINOPHEN 325 MG PO TABS
325.0000 mg | ORAL_TABLET | Freq: Four times a day (QID) | ORAL | Status: DC | PRN
  Administered 2020-07-22 – 2020-07-23 (×2): 650 mg via ORAL
  Filled 2020-07-22 (×2): qty 2

## 2020-07-22 MED ORDER — 0.9 % SODIUM CHLORIDE (POUR BTL) OPTIME
TOPICAL | Status: DC | PRN
  Administered 2020-07-22: 1000 mL

## 2020-07-22 MED ORDER — SODIUM CHLORIDE 0.9 % IV SOLN: INTRAVENOUS | Status: DC

## 2020-07-22 MED ORDER — ONDANSETRON HCL 4 MG/2ML IJ SOLN
INTRAMUSCULAR | Status: AC
  Administered 2020-07-22: 4 mg
  Filled 2020-07-22: qty 2

## 2020-07-22 MED ORDER — PREDNISONE 10 MG PO TABS
10.0000 mg | ORAL_TABLET | Freq: Every day | ORAL | Status: DC
  Administered 2020-07-22: 10 mg via ORAL
  Filled 2020-07-22: qty 1

## 2020-07-22 MED ORDER — MIDAZOLAM HCL 2 MG/2ML IJ SOLN
INTRAMUSCULAR | Status: AC
  Filled 2020-07-22: qty 2

## 2020-07-22 MED ORDER — DIPHENHYDRAMINE HCL 12.5 MG/5ML PO ELIX
12.5000 mg | ORAL_SOLUTION | ORAL | Status: DC | PRN
Start: 2020-07-22 — End: 2020-07-23

## 2020-07-22 MED ORDER — OXYCODONE HCL 5 MG PO TABS
5.0000 mg | ORAL_TABLET | ORAL | Status: DC | PRN
Start: 2020-07-22 — End: 2020-07-23
  Administered 2020-07-22 (×3): 10 mg via ORAL
  Filled 2020-07-22 (×3): qty 2

## 2020-07-22 MED ORDER — METHOCARBAMOL 1000 MG/10ML IJ SOLN
500.0000 mg | Freq: Four times a day (QID) | INTRAMUSCULAR | Status: DC | PRN
  Filled 2020-07-22: qty 5

## 2020-07-22 MED ORDER — DOCUSATE SODIUM 100 MG PO CAPS
100.0000 mg | ORAL_CAPSULE | Freq: Two times a day (BID) | ORAL | Status: DC
  Administered 2020-07-22 – 2020-07-23 (×2): 100 mg via ORAL
  Filled 2020-07-22 (×2): qty 1

## 2020-07-22 MED ORDER — METHOCARBAMOL 500 MG PO TABS
500.0000 mg | ORAL_TABLET | Freq: Four times a day (QID) | ORAL | Status: DC | PRN
  Administered 2020-07-22 – 2020-07-23 (×4): 500 mg via ORAL
  Filled 2020-07-22 (×4): qty 1

## 2020-07-22 MED ORDER — METOCLOPRAMIDE HCL 5 MG PO TABS
5.0000 mg | ORAL_TABLET | Freq: Three times a day (TID) | ORAL | Status: DC | PRN
  Administered 2020-07-23: 10 mg via ORAL
  Filled 2020-07-22: qty 2

## 2020-07-22 MED ORDER — GABAPENTIN 100 MG PO CAPS
100.0000 mg | ORAL_CAPSULE | Freq: Three times a day (TID) | ORAL | Status: DC
  Administered 2020-07-22 – 2020-07-23 (×3): 100 mg via ORAL
  Filled 2020-07-22 (×3): qty 1

## 2020-07-22 MED ORDER — PHENOL 1.4 % MT LIQD: 1.0000 | OROMUCOSAL | Status: DC | PRN

## 2020-07-22 MED ORDER — CLINDAMYCIN PHOSPHATE 900 MG/50ML IV SOLN
900.0000 mg | INTRAVENOUS | Status: AC
  Administered 2020-07-22: 900 mg via INTRAVENOUS
  Filled 2020-07-22: qty 50

## 2020-07-22 MED ORDER — ONDANSETRON HCL 4 MG/2ML IJ SOLN
4.0000 mg | Freq: Four times a day (QID) | INTRAMUSCULAR | Status: DC | PRN
  Administered 2020-07-22 (×2): 4 mg via INTRAVENOUS
  Filled 2020-07-22 (×2): qty 2

## 2020-07-22 MED ORDER — AMISULPRIDE (ANTIEMETIC) 5 MG/2ML IV SOLN
10.0000 mg | Freq: Once | INTRAVENOUS | Status: AC | PRN
  Administered 2020-07-22: 10 mg via INTRAVENOUS

## 2020-07-22 MED ORDER — ORAL CARE MOUTH RINSE
15.0000 mL | Freq: Once | OROMUCOSAL | Status: AC
  Administered 2020-07-22: 15 mL via OROMUCOSAL

## 2020-07-22 MED ORDER — CLINDAMYCIN PHOSPHATE 600 MG/50ML IV SOLN
600.0000 mg | Freq: Four times a day (QID) | INTRAVENOUS | Status: AC
  Administered 2020-07-22 (×2): 600 mg via INTRAVENOUS
  Filled 2020-07-22 (×2): qty 50

## 2020-07-22 MED ORDER — MENTHOL 3 MG MT LOZG: 1.0000 | LOZENGE | OROMUCOSAL | Status: DC | PRN

## 2020-07-22 MED ORDER — SODIUM CHLORIDE 0.9 % IR SOLN
Status: DC | PRN
  Administered 2020-07-22: 1000 mL

## 2020-07-22 MED ORDER — ONDANSETRON HCL 4 MG/2ML IJ SOLN
INTRAMUSCULAR | Status: AC
  Filled 2020-07-22: qty 2

## 2020-07-22 MED ORDER — CHLORHEXIDINE GLUCONATE 0.12 % MT SOLN: 15.0000 mL | Freq: Once | OROMUCOSAL | Status: AC

## 2020-07-22 MED ORDER — AMISULPRIDE (ANTIEMETIC) 5 MG/2ML IV SOLN
INTRAVENOUS | Status: AC
  Filled 2020-07-22: qty 4

## 2020-07-22 MED ORDER — FENTANYL CITRATE (PF) 100 MCG/2ML IJ SOLN: 25.0000 ug | INTRAMUSCULAR | Status: DC | PRN

## 2020-07-22 MED ORDER — MONTELUKAST SODIUM 10 MG PO TABS
10.0000 mg | ORAL_TABLET | Freq: Every day | ORAL | Status: DC
  Administered 2020-07-22: 10 mg via ORAL
  Filled 2020-07-22: qty 1

## 2020-07-22 MED ORDER — ALBUTEROL SULFATE HFA 108 (90 BASE) MCG/ACT IN AERS: 2.0000 | INHALATION_SPRAY | RESPIRATORY_TRACT | Status: DC | PRN

## 2020-07-22 MED ORDER — DEXAMETHASONE SODIUM PHOSPHATE 10 MG/ML IJ SOLN
INTRAMUSCULAR | Status: DC | PRN
  Administered 2020-07-22: 10 mg via INTRAVENOUS

## 2020-07-22 MED ORDER — ALBUTEROL SULFATE (2.5 MG/3ML) 0.083% IN NEBU: 2.5000 mg | INHALATION_SOLUTION | RESPIRATORY_TRACT | Status: DC | PRN

## 2020-07-22 MED ORDER — PHENYLEPHRINE HCL-NACL 10-0.9 MG/250ML-% IV SOLN
INTRAVENOUS | Status: DC | PRN
  Administered 2020-07-22: 25 ug/min via INTRAVENOUS

## 2020-07-22 MED ORDER — STERILE WATER FOR IRRIGATION IR SOLN
Status: DC | PRN
  Administered 2020-07-22: 2000 mL

## 2020-07-22 MED ORDER — MIDAZOLAM HCL 5 MG/5ML IJ SOLN
INTRAMUSCULAR | Status: DC | PRN
  Administered 2020-07-22 (×2): 1 mg via INTRAVENOUS

## 2020-07-22 MED ORDER — POVIDONE-IODINE 10 % EX SWAB
2.0000 "application " | Freq: Once | CUTANEOUS | Status: AC
  Administered 2020-07-22: 2 via TOPICAL

## 2020-07-22 MED ORDER — BISACODYL 10 MG RE SUPP: 10.0000 mg | Freq: Every day | RECTAL | Status: DC | PRN

## 2020-07-22 MED ORDER — FENTANYL CITRATE (PF) 100 MCG/2ML IJ SOLN
INTRAMUSCULAR | Status: AC
  Filled 2020-07-22: qty 2

## 2020-07-22 MED ORDER — PROPOFOL 500 MG/50ML IV EMUL
INTRAVENOUS | Status: DC | PRN
  Administered 2020-07-22: 50 ug/kg/min via INTRAVENOUS

## 2020-07-22 MED ORDER — LACTATED RINGERS IV SOLN: INTRAVENOUS | Status: DC

## 2020-07-22 MED ORDER — LEVOTHYROXINE SODIUM 75 MCG PO TABS
75.0000 ug | ORAL_TABLET | Freq: Every day | ORAL | Status: DC
  Administered 2020-07-23: 75 ug via ORAL
  Filled 2020-07-22: qty 1

## 2020-07-22 MED ORDER — FAMOTIDINE 20 MG PO TABS: 20.0000 mg | ORAL_TABLET | Freq: Every day | ORAL | Status: DC | PRN

## 2020-07-22 MED ORDER — ALUM & MAG HYDROXIDE-SIMETH 200-200-20 MG/5ML PO SUSP: 30.0000 mL | ORAL | Status: DC | PRN

## 2020-07-22 MED ORDER — ASPIRIN 81 MG PO CHEW
81.0000 mg | CHEWABLE_TABLET | Freq: Two times a day (BID) | ORAL | Status: DC
  Administered 2020-07-22 – 2020-07-23 (×2): 81 mg via ORAL
  Filled 2020-07-22 (×2): qty 1

## 2020-07-22 MED ORDER — METOCLOPRAMIDE HCL 5 MG/ML IJ SOLN
5.0000 mg | Freq: Three times a day (TID) | INTRAMUSCULAR | Status: DC | PRN
  Administered 2020-07-22: 10 mg via INTRAVENOUS
  Filled 2020-07-22: qty 2

## 2020-07-22 MED ORDER — LORAZEPAM 1 MG PO TABS
1.0000 mg | ORAL_TABLET | Freq: Every day | ORAL | Status: DC
  Administered 2020-07-22: 1 mg via ORAL
  Filled 2020-07-22: qty 1

## 2020-07-22 MED ORDER — OXYCODONE HCL 5 MG PO TABS
10.0000 mg | ORAL_TABLET | ORAL | Status: DC | PRN
Start: 2020-07-22 — End: 2020-07-23
  Administered 2020-07-23: 10 mg via ORAL
  Administered 2020-07-23 (×2): 15 mg via ORAL
  Filled 2020-07-22: qty 2
  Filled 2020-07-22 (×2): qty 3

## 2020-07-22 MED ORDER — BUPIVACAINE IN DEXTROSE 0.75-8.25 % IT SOLN
INTRATHECAL | Status: DC | PRN
  Administered 2020-07-22: 1.8 mL via INTRATHECAL

## 2020-07-22 MED ORDER — PRAVASTATIN SODIUM 20 MG PO TABS
40.0000 mg | ORAL_TABLET | Freq: Every day | ORAL | Status: DC
  Administered 2020-07-22: 40 mg via ORAL
  Filled 2020-07-22: qty 2

## 2020-07-22 MED ORDER — TRANEXAMIC ACID-NACL 1000-0.7 MG/100ML-% IV SOLN
1000.0000 mg | INTRAVENOUS | Status: AC
  Administered 2020-07-22: 1000 mg via INTRAVENOUS
  Filled 2020-07-22: qty 100

## 2020-07-22 MED ORDER — OXYCODONE HCL 5 MG PO TABS: 5.0000 mg | ORAL_TABLET | Freq: Once | ORAL | Status: DC | PRN

## 2020-07-22 MED ORDER — LORAZEPAM 1 MG PO TABS: 1.0000 mg | ORAL_TABLET | Freq: Every day | ORAL | Status: DC | PRN

## 2020-07-22 MED ORDER — ONDANSETRON HCL 4 MG/2ML IJ SOLN
4.0000 mg | Freq: Once | INTRAMUSCULAR | Status: AC | PRN
  Administered 2020-07-22: 4 mg via INTRAVENOUS

## 2020-07-22 MED ORDER — CITALOPRAM HYDROBROMIDE 20 MG PO TABS
40.0000 mg | ORAL_TABLET | Freq: Every day | ORAL | Status: DC
  Administered 2020-07-22: 40 mg via ORAL
  Filled 2020-07-22: qty 2

## 2020-07-22 MED ORDER — OXYCODONE HCL 5 MG/5ML PO SOLN: 5.0000 mg | Freq: Once | ORAL | Status: DC | PRN

## 2020-07-22 MED ORDER — DEXAMETHASONE SODIUM PHOSPHATE 10 MG/ML IJ SOLN
INTRAMUSCULAR | Status: AC
  Filled 2020-07-22: qty 1

## 2020-07-22 MED ORDER — PROPOFOL 10 MG/ML IV BOLUS
INTRAVENOUS | Status: DC | PRN
  Administered 2020-07-22: 20 mg via INTRAVENOUS

## 2020-07-22 MED ORDER — PANTOPRAZOLE SODIUM 40 MG PO TBEC
40.0000 mg | DELAYED_RELEASE_TABLET | Freq: Two times a day (BID) | ORAL | Status: DC
  Administered 2020-07-22 – 2020-07-23 (×2): 40 mg via ORAL
  Filled 2020-07-22 (×2): qty 1

## 2020-07-22 MED ORDER — FENTANYL CITRATE (PF) 100 MCG/2ML IJ SOLN
INTRAMUSCULAR | Status: DC | PRN
  Administered 2020-07-22: 50 ug via INTRAVENOUS

## 2020-07-22 MED ORDER — PREDNISONE 20 MG PO TABS
20.0000 mg | ORAL_TABLET | Freq: Every day | ORAL | Status: DC
  Administered 2020-07-23: 20 mg via ORAL
  Filled 2020-07-22: qty 1

## 2020-07-22 MED ORDER — PROPOFOL 10 MG/ML IV BOLUS
INTRAVENOUS | Status: AC
  Filled 2020-07-22: qty 20

## 2020-07-22 MED ORDER — HYDROMORPHONE HCL 1 MG/ML IJ SOLN
0.5000 mg | INTRAMUSCULAR | Status: DC | PRN
Start: 2020-07-22 — End: 2020-07-23
  Administered 2020-07-22 (×2): 1 mg via INTRAVENOUS
  Filled 2020-07-22 (×2): qty 1

## 2020-07-22 SURGICAL SUPPLY — 39 items
APL SKNCLS STERI-STRIP NONHPOA (GAUZE/BANDAGES/DRESSINGS)
BAG SPEC THK2 15X12 ZIP CLS (MISCELLANEOUS)
BAG ZIPLOCK 12X15 (MISCELLANEOUS) IMPLANT
BENZOIN TINCTURE PRP APPL 2/3 (GAUZE/BANDAGES/DRESSINGS) IMPLANT
BLADE SAW SGTL 18X1.27X75 (BLADE) ×2 IMPLANT
COVER PERINEAL POST (MISCELLANEOUS) ×2 IMPLANT
COVER SURGICAL LIGHT HANDLE (MISCELLANEOUS) ×2 IMPLANT
COVER WAND RF STERILE (DRAPES) ×2 IMPLANT
CUP SECTOR GRIPTON 50MM (Cup) ×1 IMPLANT
DRAPE STERI IOBAN 125X83 (DRAPES) ×2 IMPLANT
DRAPE U-SHAPE 47X51 STRL (DRAPES) ×4 IMPLANT
DRSG AQUACEL AG ADV 3.5X10 (GAUZE/BANDAGES/DRESSINGS) ×2 IMPLANT
DURAPREP 26ML APPLICATOR (WOUND CARE) ×2 IMPLANT
ELECT REM PT RETURN 15FT ADLT (MISCELLANEOUS) ×2 IMPLANT
GAUZE XEROFORM 1X8 LF (GAUZE/BANDAGES/DRESSINGS) ×2 IMPLANT
GLOVE SRG 8 PF TXTR STRL LF DI (GLOVE) ×2 IMPLANT
GLOVE SURG ENC MOIS LTX SZ7.5 (GLOVE) ×2 IMPLANT
GLOVE SURG LTX SZ8 (GLOVE) ×2 IMPLANT
GLOVE SURG UNDER POLY LF SZ8 (GLOVE) ×4
GOWN STRL REUS W/TWL XL LVL3 (GOWN DISPOSABLE) ×4 IMPLANT
HANDPIECE INTERPULSE COAX TIP (DISPOSABLE) ×2
HEAD FEM STD 32X+1 STRL (Hips) ×1 IMPLANT
HOLDER FOLEY CATH W/STRAP (MISCELLANEOUS) ×2 IMPLANT
KIT TURNOVER KIT A (KITS) ×2 IMPLANT
LINER ACETABULAR 32X50 (Liner) ×1 IMPLANT
PACK ANTERIOR HIP CUSTOM (KITS) ×2 IMPLANT
PENCIL SMOKE EVACUATOR (MISCELLANEOUS) IMPLANT
SET HNDPC FAN SPRY TIP SCT (DISPOSABLE) ×1 IMPLANT
STAPLER VISISTAT 35W (STAPLE) ×1 IMPLANT
STEM CORAIL KA10 (Stem) ×1 IMPLANT
STRIP CLOSURE SKIN 1/2X4 (GAUZE/BANDAGES/DRESSINGS) IMPLANT
SUT ETHIBOND NAB CT1 #1 30IN (SUTURE) ×2 IMPLANT
SUT ETHILON 2 0 PS N (SUTURE) IMPLANT
SUT MNCRL AB 4-0 PS2 18 (SUTURE) IMPLANT
SUT VIC AB 0 CT1 36 (SUTURE) ×2 IMPLANT
SUT VIC AB 1 CT1 36 (SUTURE) ×2 IMPLANT
SUT VIC AB 2-0 CT1 27 (SUTURE) ×4
SUT VIC AB 2-0 CT1 TAPERPNT 27 (SUTURE) ×2 IMPLANT
TRAY FOLEY MTR SLVR 16FR STAT (SET/KITS/TRAYS/PACK) IMPLANT

## 2020-07-22 NOTE — Anesthesia Procedure Notes (Signed)
Procedure Name: MAC Date/Time: 07/22/2020 7:22 AM Performed by: Lissa Morales, CRNA Pre-anesthesia Checklist: Patient identified, Emergency Drugs available, Suction available and Patient being monitored Patient Re-evaluated:Patient Re-evaluated prior to induction Oxygen Delivery Method: Simple face mask Placement Confirmation: positive ETCO2

## 2020-07-22 NOTE — Anesthesia Postprocedure Evaluation (Signed)
Anesthesia Post Note  Patient: [name]  Procedure(s) Performed: LEFT  HIP ARTHROPLASTY ANTERIOR APPROACH (Left Hip)     Patient location during evaluation: PACU Anesthesia Type: Spinal Level of consciousness: oriented and awake and alert Pain management: pain level controlled Vital Signs Assessment: post-procedure vital signs reviewed and stable Respiratory status: spontaneous breathing, respiratory function stable and nonlabored ventilation Cardiovascular status: blood pressure returned to baseline and stable Postop Assessment: no headache, no backache, no apparent nausea or vomiting and spinal receding Anesthetic complications: no   No complications documented.  Last Vitals:  Vitals:   07/22/20 0945 07/22/20 1009  BP: 101/78 103/61  Pulse: 91 86  Resp: 12   Temp:  36.4 C  SpO2: 100% 100%    Last Pain:  Vitals:   07/22/20 1009  TempSrc: Oral  PainSc:                  Lucretia Kern

## 2020-07-22 NOTE — Brief Op Note (Signed)
07/22/2020  8:52 AM  PATIENT:  [name]  55 y.o. female  PRE-OPERATIVE DIAGNOSIS:  avascular necrosis left hip  POST-OPERATIVE DIAGNOSIS:  avascular necrosis left hip  PROCEDURE:  Procedure(s) with comments: LEFT  HIP ARTHROPLASTY ANTERIOR APPROACH (Left) - RNFA  SURGEON:  Surgeon(s) and Role:    Kathryne Hitch, MD - Primary  ANESTHESIA:   spinal  EBL:  150 mL   COUNTS:  YES  DICTATION: .Other Dictation: Dictation Number [id number]  PLAN OF CARE: Admit for overnight observation  PATIENT DISPOSITION:  PACU - hemodynamically stable.   Delay start of Pharmacological VTE agent (>24hrs) due to surgical blood loss or risk of bleeding: no

## 2020-07-22 NOTE — Anesthesia Procedure Notes (Signed)
Spinal  Patient location during procedure: OR Reason for block: surgical anesthesia Staffing Performed: resident/CRNA  Resident/CRNA: Lissa Morales, CRNA Preanesthetic Checklist Completed: patient identified, IV checked, site marked, risks and benefits discussed, surgical consent, monitors and equipment checked, pre-op evaluation and timeout performed Spinal Block Patient position: sitting Prep: DuraPrep Patient monitoring: heart rate, continuous pulse ox and blood pressure Approach: midline Location: L3-4 Injection technique: single-shot Needle Needle type: Pencan  Needle gauge: 24 G Needle length: 9 cm Assessment Events: CSF return Additional Notes Expiration date of kit checked and confirmed. Patient tolerated procedure well, without complications.

## 2020-07-22 NOTE — Anesthesia Preprocedure Evaluation (Signed)
Anesthesia Evaluation  Patient identified by MRN, date of birth, ID band Patient awake    Reviewed: Allergy & Precautions, NPO status , Patient's Chart, lab work & pertinent test results  History of Anesthesia Complications Negative for: history of anesthetic complications  Airway Mallampati: II  TM Distance: >3 FB Neck ROM: Full    Dental  (+) Edentulous Upper, Edentulous Lower   Pulmonary sleep apnea , COPD,  oxygen dependent, former smoker,    Pulmonary exam normal        Cardiovascular negative cardio ROS Normal cardiovascular exam     Neuro/Psych  Headaches, Anxiety    GI/Hepatic Neg liver ROS, GERD  ,  Endo/Other  Hypothyroidism Chronic prednisone  Renal/GU negative Renal ROS  negative genitourinary   Musculoskeletal negative musculoskeletal ROS (+)   Abdominal   Peds  Hematology negative hematology ROS (+)   Anesthesia Other Findings   Reproductive/Obstetrics                            Anesthesia Physical Anesthesia Plan  ASA: IV  Anesthesia Plan: Spinal   Post-op Pain Management:    Induction:   PONV Risk Score and Plan: 2 and Propofol infusion, Treatment may vary due to age or medical condition, Ondansetron and TIVA  Airway Management Planned: Nasal Cannula and Simple Face Mask  Additional Equipment: None  Intra-op Plan:   Post-operative Plan:   Informed Consent: I have reviewed the patients History and Physical, chart, labs and discussed the procedure including the risks, benefits and alternatives for the proposed anesthesia with the patient or authorized representative who has indicated his/her understanding and acceptance.       Plan Discussed with:   Anesthesia Plan Comments:         Anesthesia Quick Evaluation

## 2020-07-22 NOTE — Progress Notes (Signed)
Orthopedic Tech Progress Note Patient Details:  [name] 03-Dec-1965 [id number] Overhead frame has been applied to patient's bed.  Patient ID: [name], female   DOB: [birth year], 55 y.o.   MRN: [id number]   Smitty Pluck 07/22/2020, 10:31 AM

## 2020-07-22 NOTE — Plan of Care (Signed)
Plan of care reviewed and discussed with the patient. 

## 2020-07-22 NOTE — Op Note (Signed)
NAME: [name], SCHREUR MEDICAL RECORD NO: [id number] ACCOUNT NO: [id number] DATE OF BIRTH: Nov 07, 1965 FACILITY: [facility] LOCATION: [id number] PHYSICIAN: Vanita Panda. Magnus Ivan, MD  Operative Report   DATE OF PROCEDURE: 07/22/2020  PREOPERATIVE DIAGNOSIS:  Avascular necrosis, left hip.  POSTOPERATIVE DIAGNOSIS:  Avascular necrosis, left hip.  PROCEDURE:  Left total hip arthroplasty through direct anterior approach.  IMPLANTS:  DePuy sector Gription acetabular component size 50, size 32+0 neutral polyethylene liner, size 10 Corail femoral component with standard offset, size 32+1 metal hip ball.  SURGEON:  Vanita Panda. Magnus Ivan, MD  ASSISTANT:  Wonda Olds RNFA, Marylu Lund.  ANESTHESIA:  Spinal.  ANTIBIOTICS:  900 mg IV clindamycin.  BLOOD LOSS:  150 mL.  COMPLICATIONS:  None.  INDICATIONS:  The patient is a 55 year old female with known avascular necrosis of both her hips.  I actually replaced her right hip in 04/2020.  Her left hip pain is daily and is debilitating.  Her MRI also showed AVN on that left hip.  Given the  successful right hip replacement combined with the pain of her left hip and the diagnosis of AVN, we have recommended a total hip arthroplasty on the left side.  Having had this done before, she was fully aware of the risk of acute blood loss anemia,  nerve or vessel injury, fracture, infection, DVT, and implant failure as well as skin and soft tissue issues.  We have described the goals of being decreased pain, improved mobility, and overall improved quality of life.  DESCRIPTION OF PROCEDURE:  After informed consent was obtained, appropriate left hip was marked.  She was brought to the operating room and sat up on a stretcher.  Spinal anesthesia was obtained.  She was laid in supine position on the stretcher.  Foley  catheter was placed, and traction boots were placed on both her feet.  Next, she was placed supine on the Hana fracture table, the perineal post in place  and both legs in line skeletal traction device and no traction applied.  Of note, her preoperative  leg lengths were equal, but the x-ray showed that she looked short on this side, but she is not.  Her left operative hip was prepped and draped with DuraPrep and sterile drapes.  A timeout was called.  She was identified as correct patient, correct left  hip.  I then made an incision inferior and posterior to the anterior superior iliac spine and carried this obliquely down the leg.  We dissected down tensor fascia lata muscle.  Tensor fascia was then divided longitudinally to proceed with direct  anterior approach to the hip.  We identified and cauterized circumflex vessels, then identified the hip capsule, entered the hip capsule in L-type format finding a moderate joint effusion.  We placed curved retractors in the medial and lateral femoral  neck area, made a femoral neck cut with an oscillating saw just above the lesser trochanter, and completed this with an osteotome.  We placed a corkscrew guide in the femoral head and removed the femoral head in its entirety.  We then placed a bent  Hohmann over the medial acetabular rim and removed remnants of the acetabular labrum and other debris.  We then began reaming under direct visualization from a size 43 reamer in a stepwise increments going up to a size 49 with all reamers placed under  direct visualization.  The last reamer was also placed under direct fluoroscopy, so I could obtain my depth of reaming, my inclination and anteversion.  I  then placed real DePuy sector Gription acetabular component size 50 and a 32+0 neutral polyethylene  liner for that size acetabular component.  Attention was then turned to the femur.  With leg externally rotated 120 degrees, extended, and adducted, we were able to place a Mueller retractor medially and Hohman retractor behind the greater trochanter.   We released lateral joint capsule using box cutting osteotome to enter  the femoral canal and a rongeur to lateralize.  We then began broaching using the Corail broaching system from size 8 to a size 10.  This correlated with other side as well as far as  the stem size.  We placed standard offset femoral neck trial and a 32+1 trial hip ball and reduced this in the acetabulum.  We were pleased with leg length, offset, range of motion, and stability, assessed radiographically and mechanically.  We then  dislocated the hip, removed the trial components.  I placed the real Corail femoral component size 10 with standard offset and the real 32+1 metal hip ball and again reduced this in acetabulum and it was stable.  We again assessed this radiographically  and mechanically.  We then irrigated the soft tissue with normal saline solution using pulsatile lavage.  We dried the hip real well and then closed the joint capsule with interrupted #1 Ethibond suture followed by running #1 Vicryl to close the tensor  fascia.  0 Vicryl was used to close deep tissue and 2-0 Vicryl was used to close subcutaneous tissue.  Skin was reapproximated with staples.  An Aquacel dressing was applied.  She was taken off the Hana table and taken to recovery room in stable  condition with all final counts being correct.  No complications noted.   [facility] D: 07/22/2020 8:51:16 am T: 07/22/2020 5:18:00 pm  JOB: [id number]/ [id number]

## 2020-07-22 NOTE — Addendum Note (Signed)
Addendum  created 07/22/20 1247 by Illene Silver, CRNA   Intraprocedure Meds edited

## 2020-07-22 NOTE — Transfer of Care (Signed)
Immediate Anesthesia Transfer of Care Note  Patient: [name]  Procedure(s) Performed: LEFT  HIP ARTHROPLASTY ANTERIOR APPROACH (Left Hip)  Patient Location: PACU  Anesthesia Type:Spinal  Level of Consciousness: awake, alert  and oriented  Airway & Oxygen Therapy: Patient Spontanous Breathing and Patient connected to face mask oxygen  Post-op Assessment: Report given to RN and Post -op Vital signs reviewed and stable  Post vital signs: Reviewed and stable  Last Vitals:  Vitals Value Taken Time  BP 110/72 07/22/20 0845  Temp    Pulse 71 07/22/20 0847  Resp 15 07/22/20 0847  SpO2 100 % 07/22/20 0847  Vitals shown include unvalidated device data.  Last Pain:  Vitals:   07/22/20 0535  TempSrc: Oral         Complications: No complications documented.

## 2020-07-22 NOTE — Interval H&P Note (Signed)
History and Physical Interval Note: The patient understands that she is here today for a left total hip arthroplasty to treat her left hip avascular necrosis.  There is been no acute or interval change in her medical status.  See recent H&P.  The risks and benefits of surgery been explained in detail and informed consent is obtained.  The left hip has been marked.  07/22/2020 7:14 AM  [name]  has presented today for surgery, with the diagnosis of avascular necrosis left hip.  The various methods of treatment have been discussed with the patient and family. After consideration of risks, benefits and other options for treatment, the patient has consented to  Procedure(s) with comments: LEFT  HIP ARTHROPLASTY ANTERIOR APPROACH (Left) - RNFA as a surgical intervention.  The patient's history has been reviewed, patient examined, no change in status, stable for surgery.  I have reviewed the patient's chart and labs.  Questions were answered to the patient's satisfaction.     Kathryne Hitch

## 2020-07-23 DIAGNOSIS — M87052 Idiopathic aseptic necrosis of left femur: Secondary | ICD-10-CM | POA: Diagnosis not present

## 2020-07-23 LAB — CBC
HCT: 28.5 % — ABNORMAL LOW (ref 36.0–46.0)
Hemoglobin: 8.6 g/dL — ABNORMAL LOW (ref 12.0–15.0)
MCH: 28.7 pg (ref 26.0–34.0)
MCHC: 30.2 g/dL (ref 30.0–36.0)
MCV: 95 fL (ref 80.0–100.0)
Platelets: 281 10*3/uL (ref 150–400)
RBC: 3 MIL/uL — ABNORMAL LOW (ref 3.87–5.11)
RDW: 13.6 % (ref 11.5–15.5)
WBC: 17 10*3/uL — ABNORMAL HIGH (ref 4.0–10.5)
nRBC: 0 % (ref 0.0–0.2)

## 2020-07-23 LAB — BASIC METABOLIC PANEL
Anion gap: 8 (ref 5–15)
BUN: 13 mg/dL (ref 6–20)
CO2: 28 mmol/L (ref 22–32)
Calcium: 9 mg/dL (ref 8.9–10.3)
Chloride: 103 mmol/L (ref 98–111)
Creatinine, Ser: 0.93 mg/dL (ref 0.44–1.00)
GFR, Estimated: >60 mL/min (ref >60)
Glucose, Bld: 149 mg/dL — ABNORMAL HIGH (ref 70–99)
Potassium: 5 mmol/L (ref 3.5–5.1)
Sodium: 139 mmol/L (ref 135–145)

## 2020-07-23 MED ORDER — ASPIRIN 81 MG PO CHEW: 81.0000 mg | CHEWABLE_TABLET | Freq: Two times a day (BID) | ORAL | 0 refills | Status: DC

## 2020-07-23 MED ORDER — METHOCARBAMOL 500 MG PO TABS: 500.0000 mg | ORAL_TABLET | Freq: Four times a day (QID) | ORAL | 0 refills | Status: DC | PRN

## 2020-07-23 MED ORDER — GABAPENTIN 100 MG PO CAPS: 100.0000 mg | ORAL_CAPSULE | Freq: Three times a day (TID) | ORAL | 0 refills | Status: DC | PRN

## 2020-07-23 MED ORDER — OXYCODONE HCL 5 MG PO TABS: 5.0000 mg | ORAL_TABLET | Freq: Four times a day (QID) | ORAL | 0 refills | Status: DC | PRN

## 2020-07-23 NOTE — Evaluation (Signed)
Physical Therapy Evaluation Patient Details Name: [name] MRN: [id number] DOB: 1966-03-29 Today's Date: 07/23/2020   History of Present Illness  Pt s/p L THR and with hx of COPD, R THR January 2022, R lung lobectomy and cervical fusion.  Clinical Impression  Pt s/p L THR and presents with decreased L LE strength/ROM and post op pain limiting functional mobility.  Pt should progress to dc home with family assist.    Follow Up Recommendations Home health PT;Follow surgeon's recommendation for DC plan and follow-up therapies    Equipment Recommendations  None recommended by PT    Recommendations for Other Services       Precautions / Restrictions Precautions Precautions: Fall Restrictions Weight Bearing Restrictions: No Other Position/Activity Restrictions: WBAT      Mobility  Bed Mobility               General bed mobility comments: Pt up in chair and requests back to same    Transfers Overall transfer level: Needs assistance Equipment used: Rolling walker (2 wheeled) Transfers: Sit to/from Stand Sit to Stand: Min assist         General transfer comment: cues for LE management and use of UEs to self assist  Ambulation/Gait Ambulation/Gait assistance: Min assist;Min guard Gait Distance (Feet): 42 Feet Assistive device: Rolling walker (2 wheeled) Gait Pattern/deviations: Step-to pattern;Decreased step length - right;Decreased step length - left;Shuffle;Trunk flexed Gait velocity: decr   General Gait Details: cues for sequence, posture and position from RW; distance pain limited  Stairs            Wheelchair Mobility    Modified Rankin (Stroke Patients Only)       Balance Overall balance assessment: Needs assistance Sitting-balance support: No upper extremity supported;Feet supported Sitting balance-Leahy Scale: Good     Standing balance support: Bilateral upper extremity supported Standing balance-Leahy Scale: Poor                                Pertinent Vitals/Pain Pain Assessment: 0-10 Pain Score: 6  Pain Location: L hip/thigh with activity Pain Descriptors / Indicators: Aching;Burning;Grimacing;Sore Pain Intervention(s): Limited activity within patient's tolerance;Monitored during session;Premedicated before session;Ice applied    Home Living Family/patient expects to be discharged to:: Private residence Living Arrangements: Spouse/significant other Available Help at Discharge: Family Type of Home: House Home Access: Stairs to enter Entrance Stairs-Rails: None Entrance Stairs-Number of Steps: 2 Home Layout: One level Home Equipment: Environmental consultant - 4 wheels;Walker - 2 wheels;Bedside commode      Prior Function Level of Independence: Independent               Hand Dominance        Extremity/Trunk Assessment   Upper Extremity Assessment Upper Extremity Assessment: Overall WFL for tasks assessed    Lower Extremity Assessment Lower Extremity Assessment: LLE deficits/detail LLE Deficits / Details: 2/5 strength at hip with AAROM at hip to 80 flex and 15 abd       Communication   Communication: No difficulties  Cognition Arousal/Alertness: Awake/alert Behavior During Therapy: WFL for tasks assessed/performed Overall Cognitive Status: Within Functional Limits for tasks assessed                                        General Comments      Exercises Total Joint Exercises Ankle Circles/Pumps:  AROM;Both;15 reps;Supine Quad Sets: AROM;Both;10 reps;Supine Hip ABduction/ADduction: AAROM;Left;20 reps;Supine Straight Leg Raises: AAROM;Left;15 reps;Supine   Assessment/Plan    PT Assessment Patient needs continued PT services  PT Problem List Decreased strength;Decreased range of motion;Decreased activity tolerance;Decreased balance;Decreased mobility;Decreased knowledge of use of DME;Pain       PT Treatment Interventions DME instruction;Gait training;Stair  training;Functional mobility training;Therapeutic activities;Therapeutic exercise;Patient/family education    PT Goals (Current goals can be found in the Care Plan section)  Acute Rehab PT Goals Patient Stated Goal: Regain IND PT Goal Formulation: With patient Time For Goal Achievement: 07/30/20 Potential to Achieve Goals: Good    Frequency 7X/week   Barriers to discharge        Co-evaluation               AM-PAC PT "6 Clicks" Mobility  Outcome Measure Help needed turning from your back to your side while in a flat bed without using bedrails?: A Little Help needed moving from lying on your back to sitting on the side of a flat bed without using bedrails?: A Little Help needed moving to and from a bed to a chair (including a wheelchair)?: A Little Help needed standing up from a chair using your arms (e.g., wheelchair or bedside chair)?: A Little Help needed to walk in hospital room?: A Little Help needed climbing 3-5 steps with a railing? : A Little 6 Click Score: 18    End of Session Equipment Utilized During Treatment: Gait belt Activity Tolerance: Patient tolerated treatment well;Patient limited by pain Patient left: in chair;with call bell/phone within reach;with chair alarm set;with family/visitor present Nurse Communication: Mobility status PT Visit Diagnosis: Difficulty in walking, not elsewhere classified (R26.2);Pain Pain - Right/Left: Left Pain - part of body: Hip    Time: 0912-0937 PT Time Calculation (min) (ACUTE ONLY): 25 min   Charges:   PT Evaluation $PT Eval Low Complexity: 1 Low PT Treatments $Therapeutic Exercise: 8-22 mins        Mauro Kaufmann PT Acute Rehabilitation Services Pager [phone] Office [phone]   [name] 07/23/2020, 12:58 PM

## 2020-07-23 NOTE — Progress Notes (Signed)
Subjective: 1 Day Post-Op Procedure(s) (LRB): LEFT  HIP ARTHROPLASTY ANTERIOR APPROACH (Left) Patient reports pain as moderate.  Acute blood loss anemia from surgery, but asymptomatic with stable vitals  Objective: Vital signs in last 24 hours: Temp:  [98.1 F (36.7 C)-98.8 F (37.1 C)] 98.2 F (36.8 C) (04/16 0522) Pulse Rate:  [92-98] 92 (04/16 0522) Resp:  [14-19] 14 (04/16 0522) BP: (98-110)/(50-74) 107/50 (04/16 0806) SpO2:  [95 %-98 %] 98 % (04/16 0522) Weight:  [68.4 kg] 68.4 kg (04/15 1955)  Intake/Output from previous day: 04/15 0701 - 04/16 0700 In: 3970.4 [P.O.:950; I.V.:2770.4; IV Piggyback:250] Out: 2200 [Urine:2050; Blood:150] Intake/Output this shift: Total I/O In: 375.1 [P.O.:360; I.V.:15.1] Out: -   Recent Labs    07/23/20 0305  HGB 8.6*   Recent Labs    07/23/20 0305  WBC 17.0*  RBC 3.00*  HCT 28.5*  PLT 281   Recent Labs    07/23/20 0305  NA 139  K 5.0  CL 103  CO2 28  BUN 13  CREATININE 0.93  GLUCOSE 149*  CALCIUM 9.0   No results for input(s): LABPT, INR in the last 72 hours.  Sensation intact distally Intact pulses distally Dorsiflexion/Plantar flexion intact Incision: dressing C/D/I   Assessment/Plan: 1 Day Post-Op Procedure(s) (LRB): LEFT  HIP ARTHROPLASTY ANTERIOR APPROACH (Left) Up with therapy Discharge home with home health this afternoon in clears therapy.      Kathryne Hitch 07/23/2020, 11:03 AM

## 2020-07-23 NOTE — TOC Progression Note (Signed)
Transition of Care [facility]) - Progression Note    Patient Details  Name: SOPHIE TAMEZ MRN: [id number] Date of Birth: Aug 29, 1965  Transition of Care [facility]) CM/SW Contact  Armanda Heritage, RN Phone Number: 07/23/2020, 10:12 AM  Clinical Narrative:    [facility] to provide home health PT.  Reports has rolling walker and 3in1.   Expected Discharge Plan: Home w Home Health Services Barriers to Discharge: No Barriers Identified  Expected Discharge Plan and Services Expected Discharge Plan: Home w Home Health Services   Discharge Planning Services: CM Consult Post Acute Care Choice: Home Health Living arrangements for the past 2 months: Single Family Home                 DME Arranged: N/A DME Agency: NA       HH Arranged: PT HH Agency: [facility]     Representative spoke with at [facility] Agency: pre arranged in md office   Social Determinants of Health (SDOH) Interventions    Readmission Risk Interventions No flowsheet data found.

## 2020-07-23 NOTE — Plan of Care (Signed)
  Problem: Pain Management: Goal: Pain level will decrease with appropriate interventions Outcome: Progressing   Problem: Education: Goal: Knowledge of General Education information will improve Description: Including pain rating scale, medication(s)/side effects and non-pharmacologic comfort measures Outcome: Progressing   Problem: Health Behavior/Discharge Planning: Goal: Ability to manage health-related needs will improve Outcome: Progressing   Problem: Activity: Goal: Risk for activity intolerance will decrease Outcome: Progressing   Problem: Pain Managment: Goal: General experience of comfort will improve Outcome: Progressing   

## 2020-07-23 NOTE — Discharge Summary (Signed)
Patient ID: [name] MRN: [id number] DOB/AGE: 57-Apr-1967 57 y.o.  Admit date: 07/22/2020 Discharge date: 07/23/2020  Admission Diagnoses:  Principal Problem:   Avascular necrosis of bone of left hip [facility]) Active Problems:   Status post left hip replacement   Discharge Diagnoses:  Same  Past Medical History:  Diagnosis Date  . Anemia   . Anxiety   . Arthritis   . Asthma   . Cervical radiculopathy   . Cluster headaches   . COPD (chronic obstructive pulmonary disease) (HCC)   . COPD (chronic obstructive pulmonary disease) with chronic bronchitis (HCC)   . GERD (gastroesophageal reflux disease)   . Hyperlipemia   . Hypoglycemia   . Hypothyroidism   . Migraine   . Pneumothorax   . Pre-diabetes   . Rectal discharge 07/28/2010  . Shortness of breath   . Sleep apnea    cannot tolerate-not using CPAP  . Sluggishness 07/28/2010    Surgeries: Procedure(s): LEFT  HIP ARTHROPLASTY ANTERIOR APPROACH on 07/22/2020   Consultants:   Discharged Condition: Improved  Hospital Course: Ashana Tullo Nutting is an 55 y.o. female who was admitted 07/22/2020 for operative treatment ofAvascular necrosis of bone of left hip (HCC). Patient has severe unremitting pain that affects sleep, daily activities, and work/hobbies. After pre-op clearance the patient was taken to the operating room on 07/22/2020 and underwent  Procedure(s): LEFT  HIP ARTHROPLASTY ANTERIOR APPROACH.    Patient was given perioperative antibiotics:  Anti-infectives (From admission, onward)   Start     Dose/Rate Route Frequency Ordered Stop   07/22/20 1400  clindamycin (CLEOCIN) IVPB 600 mg        600 mg 100 mL/hr over 30 Minutes Intravenous Every 6 hours 07/22/20 1015 07/22/20 2027   07/22/20 0600  clindamycin (CLEOCIN) IVPB 900 mg        900 mg 100 mL/hr over 30 Minutes Intravenous On call to O.R. 07/22/20 0524 07/22/20 0725       Patient was given sequential compression devices, early ambulation, and  chemoprophylaxis to prevent DVT.  Patient benefited maximally from hospital stay and there were no complications.    Recent vital signs:  Patient Vitals for the past 24 hrs:  BP Temp Temp src Pulse Resp SpO2 Height Weight  07/23/20 0806 (!) 107/50 -- -- -- -- -- -- --  07/23/20 0522 98/65 98.2 F (36.8 C) Oral 92 14 98 % -- --  07/23/20 0146 100/74 98.1 F (36.7 C) Oral 96 14 98 % -- --  07/22/20 2226 (!) 110/59 98.8 F (37.1 C) Oral 97 16 96 % -- --  07/22/20 1955 -- -- -- -- -- -- 5' 4.5" (1.638 m) 68.4 kg  07/22/20 1439 103/60 98.3 F (36.8 C) -- 98 19 95 % -- --     Recent laboratory studies:  Recent Labs    07/23/20 0305  WBC 17.0*  HGB 8.6*  HCT 28.5*  PLT 281  NA 139  K 5.0  CL 103  CO2 28  BUN 13  CREATININE 0.93  GLUCOSE 149*  CALCIUM 9.0     Discharge Medications:   Allergies as of 07/23/2020      Reactions   Aspirin Shortness Of Breath   Causes asthma flares    Penicillins Other (See Comments)   UNSPECIFIED REACTION OF CHILDHOOD Has patient had a PCN reaction causing immediate rash, facial/tongue/throat swelling, SOB or lightheadedness with hypotension: NO Has patient had a PCN reaction causing severe rash involving mucus membranes or skin  necrosis: NO Has patient had a PCN reaction that required hospitalization: no Has patient had a PCN reaction occurring within the last 10 years: NO If all of the above answers are "NO", then may proceed with Cephalosporin use.   Percocet [oxycodone-acetaminophen]    migraines   Tramadol    insomnia   Vicodin [hydrocodone-acetaminophen] Other (See Comments)   Headaches       Medication List    TAKE these medications   acetaminophen 500 MG tablet Commonly known as: TYLENOL Take 1,000 mg by mouth every 6 (six) hours as needed for moderate pain.   albuterol (2.5 MG/3ML) 0.083% nebulizer solution Commonly known as: PROVENTIL Take 3 mLs (2.5 mg total) by nebulization every 4 (four) hours as needed for wheezing  or shortness of breath.   albuterol 108 (90 Base) MCG/ACT inhaler Commonly known as: VENTOLIN HFA Inhale 2 puffs into the lungs every 4 (four) hours as needed for shortness of breath.   aspirin 81 MG chewable tablet Chew 1 tablet (81 mg total) by mouth 2 (two) times daily.   aspirin-acetaminophen-caffeine 250-250-65 MG tablet Commonly known as: EXCEDRIN MIGRAINE Take 2 tablets by mouth every 8 (eight) hours as needed for migraine or headache.   bismuth subsalicylate 262 MG chewable tablet Commonly known as: PEPTO BISMOL Chew 524 mg by mouth daily as needed for indigestion.   calcium carbonate 750 MG chewable tablet Commonly known as: TUMS EX Chew 2 tablets by mouth daily as needed for heartburn.   cetirizine 10 MG tablet Commonly known as: ZYRTEC Take 10 mg by mouth daily.   citalopram 40 MG tablet Commonly known as: CELEXA Take 40 mg by mouth at bedtime.   docusate sodium 100 MG capsule Commonly known as: COLACE Take 100-200 mg by mouth daily as needed for mild constipation.   famotidine 20 MG tablet Commonly known as: PEPCID Take 20 mg by mouth daily as needed for heartburn or indigestion.   gabapentin 100 MG capsule Commonly known as: NEURONTIN Take 1 capsule (100 mg total) by mouth 3 (three) times daily as needed.   levothyroxine 75 MCG tablet Commonly known as: SYNTHROID Take 0.5 tablets (37.5 mcg total) by mouth daily before breakfast.   LORazepam 1 MG tablet Commonly known as: ATIVAN Take 1 mg by mouth See admin instructions. Take 1 mg at bedtime, may take a second 1 mg dose during the day as needed for anxiety   methocarbamol 500 MG tablet Commonly known as: ROBAXIN Take 1 tablet (500 mg total) by mouth every 6 (six) hours as needed for muscle spasms.   montelukast 10 MG tablet Commonly known as: SINGULAIR Take 1 tablet (10 mg total) by mouth at bedtime.   NASACORT ALLERGY 24HR NA Place 2 sprays into the nose daily as needed (allergies).   oxyCODONE  5 MG immediate release tablet Commonly known as: Oxy IR/ROXICODONE Take 1-2 tablets (5-10 mg total) by mouth every 6 (six) hours as needed for moderate pain (pain score 4-6).   pantoprazole 40 MG tablet Commonly known as: PROTONIX Take 30- 60 min before your first and last meals of the day What changed:   how much to take  how to take this  when to take this   pravastatin 40 MG tablet Commonly known as: PRAVACHOL Take 40 mg by mouth at bedtime.   predniSONE 10 MG tablet Commonly known as: DELTASONE Take 2 tablets (20 mg total) by mouth daily.   Stiolto Respimat 2.5-2.5 MCG/ACT Aers Generic drug: Tiotropium Bromide-Olodaterol Inhale  2 puffs into the lungs daily.   SUMAtriptan 6 MG/0.5ML Soaj Inject 6 mg as directed daily as needed (migraine).   valACYclovir 500 MG tablet Commonly known as: VALTREX Take 500-2,000 mg by mouth See admin instructions. Take 2000mg s at first sign of fever blister outbreak then take 500mg s daily until gone   Vitamin D3 Super Strength 50 MCG (2000 UT) Caps Generic drug: Cholecalciferol Take 2,000 Units by mouth daily.            Durable Medical Equipment  (From admission, onward)         Start     Ordered   07/22/20 1016  DME 3 n 1  Once        07/22/20 1015   07/22/20 1016  DME Walker rolling  Once       Question Answer Comment  Walker: With 5 Inch Wheels   Patient needs a walker to treat with the following condition Status post left hip replacement      07/22/20 1015          Diagnostic Studies: DG Pelvis Portable  Result Date: 07/22/2020 CLINICAL DATA:  Post LEFT hip replacement EXAM: PORTABLE PELVIS 1-2 VIEWS COMPARISON:  Portable exam 0909 hours compared to 04/15/2020 FINDINGS: BILATERAL hip prostheses identified, new on LEFT. Bones demineralized. No fracture, dislocation or periprosthetic lucency. SI joints preserved. Skin clips laterally at LEFT hip region. IMPRESSION: New LEFT hip prosthesis without acute complication.  Electronically Signed   By: 07/24/2020 M.D.   On: 07/22/2020 10:13   DG C-Arm 1-60 Min-No Report  Result Date: 07/22/2020 Fluoroscopy was utilized by the requesting physician.  No radiographic interpretation.   DG HIP OPERATIVE UNILAT W OR W/O PELVIS LEFT  Result Date: 07/22/2020 CLINICAL DATA:  Left hip replacement. EXAM: OPERATIVE LEFT HIP (WITH PELVIS IF PERFORMED) 2 VIEWS TECHNIQUE: Fluoroscopic spot image(s) were submitted for interpretation post-operatively. COMPARISON:  04/15/2020 FINDINGS: Left hip arthroplasty was performed. The left hip arthroplasty is located based on these images. No evidence for a periprosthetic fracture. Patient also has a right hip replacement. IMPRESSION: Left hip replacement without complicating features. Electronically Signed   By: 07/24/2020 M.D.   On: 07/22/2020 08:52    Disposition: Discharge disposition: 01-Home or Self Care          Follow-up Information    Richarda Overlie, MD Follow up in 2 week(s).   Specialty: Orthopedic Surgery Contact information: [address] [place] [address] [place] [phone]        [facility] Follow up.   Specialty: Home Health Services Why: agency will provide home health therapy Contact information: [address] [place] [address] [place] [phone]                Signed: [id number] 07/23/2020, 11:06 AM

## 2020-07-23 NOTE — Progress Notes (Signed)
Patient discharged to home w/ family. Given all belongings, instructions. Verbalized understanding of instructions. Escorted to pov via w/c. 

## 2020-07-23 NOTE — Progress Notes (Signed)
Physical Therapy Treatment Patient Details Name: [name] MRN: [id number] DOB: 10/25/1965 Today's Date: 07/23/2020    History of Present Illness Pt s/p L THR and with hx of COPD, R THR January 2022, R lung lobectomy and cervical fusion.    PT Comments    Pt in good spirits and progressing well with mobility.  Pt up to ambulate increased distance in hall, negotiated stairs and reviewed car transfers.  Pt and spouse state they feel comfortable with ability to manage at home based on experience with R THR in January and wish to proceed with dc.   Follow Up Recommendations  Home health PT;Follow surgeon's recommendation for DC plan and follow-up therapies     Equipment Recommendations  None recommended by PT    Recommendations for Other Services       Precautions / Restrictions Precautions Precautions: Fall Restrictions Weight Bearing Restrictions: No Other Position/Activity Restrictions: WBAT    Mobility  Bed Mobility               General bed mobility comments: Pt up in chair and requests back to same.  Pt states she feels comfortable with spouse's ability to assist her    Transfers Overall transfer level: Needs assistance Equipment used: Rolling walker (2 wheeled) Transfers: Sit to/from Stand Sit to Stand: Min guard;Supervision         General transfer comment: cues for LE management and use of UEs to self assist  Ambulation/Gait Ambulation/Gait assistance: Min guard;Supervision Gait Distance (Feet): 75 Feet Assistive device: Rolling walker (2 wheeled) Gait Pattern/deviations: Step-to pattern;Decreased step length - right;Decreased step length - left;Shuffle;Trunk flexed Gait velocity: decr   General Gait Details: cues for sequence, posture and position from RW;   Stairs Stairs: Yes Stairs assistance: Min assist Stair Management: No rails;Backwards;Forwards;Step to pattern;With walker Number of Stairs: 2 General stair comments: cues for  sequence and foot/RW placement   Wheelchair Mobility    Modified Rankin (Stroke Patients Only)       Balance Overall balance assessment: Needs assistance Sitting-balance support: No upper extremity supported;Feet supported Sitting balance-Leahy Scale: Good     Standing balance support: No upper extremity supported Standing balance-Leahy Scale: Fair                              Cognition Arousal/Alertness: Awake/alert Behavior During Therapy: WFL for tasks assessed/performed Overall Cognitive Status: Within Functional Limits for tasks assessed                                        Exercises      General Comments        Pertinent Vitals/Pain Pain Assessment: 0-10 Pain Score: 4  Pain Location: L hip/thigh with activity Pain Descriptors / Indicators: Aching;Burning;Sore Pain Intervention(s): Limited activity within patient's tolerance;Monitored during session;Premedicated before session    Home Living                      Prior Function            PT Goals (current goals can now be found in the care plan section) Acute Rehab PT Goals Patient Stated Goal: Regain IND PT Goal Formulation: With patient Time For Goal Achievement: 07/30/20 Potential to Achieve Goals: Good Progress towards PT goals: Progressing toward goals    Frequency    7X/week  PT Plan Current plan remains appropriate    Co-evaluation              AM-PAC PT "6 Clicks" Mobility   Outcome Measure  Help needed turning from your back to your side while in a flat bed without using bedrails?: A Little Help needed moving from lying on your back to sitting on the side of a flat bed without using bedrails?: A Little Help needed moving to and from a bed to a chair (including a wheelchair)?: A Little Help needed standing up from a chair using your arms (e.g., wheelchair or bedside chair)?: A Little Help needed to walk in hospital room?: A  Little Help needed climbing 3-5 steps with a railing? : A Little 6 Click Score: 18    End of Session Equipment Utilized During Treatment: Gait belt Activity Tolerance: Patient tolerated treatment well Patient left: in chair;with call bell/phone within reach;with chair alarm set;with family/visitor present Nurse Communication: Mobility status PT Visit Diagnosis: Difficulty in walking, not elsewhere classified (R26.2);Pain Pain - Right/Left: Left Pain - part of body: Hip     Time: 1420-1441 PT Time Calculation (min) (ACUTE ONLY): 21 min  Charges:  $Gait Training: 8-22 mins                     Mauro Kaufmann PT Acute Rehabilitation Services Pager [phone] Office [phone]    Ceylin Dreibelbis 07/23/2020, 4:21 PM

## 2020-07-23 NOTE — Discharge Instructions (Signed)

## 2020-07-27 ENCOUNTER — Encounter (HOSPITAL_COMMUNITY): Payer: Self-pay | Admitting: Orthopaedic Surgery

## 2020-07-28 ENCOUNTER — Other Ambulatory Visit: Payer: Self-pay | Admitting: Orthopaedic Surgery

## 2020-07-28 ENCOUNTER — Encounter: Payer: Self-pay | Admitting: Orthopaedic Surgery

## 2020-07-28 MED ORDER — OXYCODONE HCL 5 MG PO TABS: 5.0000 mg | ORAL_TABLET | Freq: Four times a day (QID) | ORAL | 0 refills | Status: DC | PRN

## 2020-07-29 ENCOUNTER — Other Ambulatory Visit: Payer: Self-pay

## 2020-07-29 ENCOUNTER — Telehealth: Payer: Self-pay

## 2020-07-29 ENCOUNTER — Encounter: Payer: Self-pay | Admitting: Orthopaedic Surgery

## 2020-07-29 ENCOUNTER — Ambulatory Visit (HOSPITAL_COMMUNITY)
Admission: RE | Admit: 2020-07-29 | Discharge: 2020-07-29 | Disposition: A | Discharge status: Home / Self Care | Payer: 59 | Source: Ambulatory Visit | Attending: Orthopaedic Surgery | Admitting: Orthopaedic Surgery

## 2020-07-29 DIAGNOSIS — Z96642 Presence of left artificial hip joint: Secondary | ICD-10-CM | POA: Diagnosis present

## 2020-07-29 NOTE — Telephone Encounter (Signed)
Ultrasound to r/o dvt was scheduled for today @ 4pm @ [facility]. Pt was advised and stated understanding

## 2020-07-29 NOTE — Telephone Encounter (Signed)
FYI

## 2020-07-29 NOTE — Progress Notes (Signed)
Lower extremity venous LT study completed.  Preliminary results relayed to April for Magnus Ivan, MD.    See CV Proc for preliminary results report.   Jean Rosenthal, RDMS, RVT

## 2020-07-29 NOTE — Telephone Encounter (Signed)
Yes called pt and scheduled scan

## 2020-07-29 NOTE — Telephone Encounter (Signed)
[name] with [facility] at [facility] wanted to let Dr. Magnus Ivan know that patient is Negative for DVT, left LE.

## 2020-08-04 ENCOUNTER — Encounter: Payer: Self-pay | Admitting: Orthopaedic Surgery

## 2020-08-04 ENCOUNTER — Ambulatory Visit (INDEPENDENT_AMBULATORY_CARE_PROVIDER_SITE_OTHER): Payer: 59 | Admitting: Orthopaedic Surgery

## 2020-08-04 DIAGNOSIS — Z96642 Presence of left artificial hip joint: Secondary | ICD-10-CM

## 2020-08-04 MED ORDER — OXYCODONE HCL 5 MG PO TABS: 5.0000 mg | ORAL_TABLET | Freq: Four times a day (QID) | ORAL | 0 refills | Status: DC | PRN

## 2020-08-04 MED ORDER — GABAPENTIN 100 MG PO CAPS: 100.0000 mg | ORAL_CAPSULE | Freq: Three times a day (TID) | ORAL | 0 refills | Status: DC | PRN

## 2020-08-04 NOTE — Progress Notes (Signed)
The patient is 2 weeks tomorrow status post a left total hip arthroplasty through a change approach.  We replaced her right hip back in January.  She said the left hip was been a little bit more problematic for her but overall she seems to doing well.  She was concerned about left foot and ankle swelling earlier this week so I sent her for a Doppler ultrasound which was negative for DVT.  She has been compliant with a baby aspirin twice daily.  There is a little bit of swelling in her foot and ankle.  She says it does resolve overnight.  Her calf is soft.  Her left hip incision looks good some of the staples in place Steri-Strips.  There is no significant seroma or other fluid collections.  Her leg lengths are equal.  She will continue to mobilize as comfort allows.  I will see her back in 4 weeks for repeat exam but no x-rays are needed.  I did refill her pain medication.  She will stop aspirin after 1 more week.  I refilled Neurontin as well.

## 2020-08-11 ENCOUNTER — Encounter: Payer: Self-pay | Admitting: Orthopaedic Surgery

## 2020-08-15 ENCOUNTER — Telehealth: Payer: Self-pay | Admitting: Orthopaedic Surgery

## 2020-08-15 ENCOUNTER — Other Ambulatory Visit: Payer: Self-pay | Admitting: Orthopaedic Surgery

## 2020-08-15 MED ORDER — OXYCODONE HCL 5 MG PO TABS: 5.0000 mg | ORAL_TABLET | Freq: Four times a day (QID) | ORAL | 0 refills | Status: DC | PRN

## 2020-08-15 NOTE — Telephone Encounter (Signed)
Please advise 

## 2020-08-15 NOTE — Telephone Encounter (Signed)
I sent in some more 

## 2020-08-15 NOTE — Telephone Encounter (Signed)
Called and advised pt.

## 2020-08-15 NOTE — Telephone Encounter (Signed)
Patient called requesitng refill of oxycodone. Please call patient when meds have been call in. Please send to pharmacy on file. Patient phone number is [phone].

## 2020-08-17 ENCOUNTER — Ambulatory Visit (INDEPENDENT_AMBULATORY_CARE_PROVIDER_SITE_OTHER): Payer: 59

## 2020-08-17 ENCOUNTER — Encounter: Payer: Self-pay | Admitting: Physician Assistant

## 2020-08-17 ENCOUNTER — Ambulatory Visit (INDEPENDENT_AMBULATORY_CARE_PROVIDER_SITE_OTHER): Payer: 59 | Admitting: Physician Assistant

## 2020-08-17 DIAGNOSIS — Z96642 Presence of left artificial hip joint: Secondary | ICD-10-CM | POA: Diagnosis not present

## 2020-08-17 NOTE — Progress Notes (Signed)
HPI: [name] status post left total hip arthroplasty 07/22/2020.  She has been doing overall but over the last week she has had increased pain lateral aspect of the hip and some popping.  She has had no particular injury.  She been doing exercises shown by PT.  She does note some groin pain also.  She is ambulating with a walker.  Physical exam: Left hip good range of motion without significant pain.  Tenderness over the left trochanteric region and down the entire IT band.  Surgical incisions well-healed there is no signs of infection.  Left calf supple nontender.  Radiographs: AP pelvis lateral view left hip: Status post bilateral total hip arthroplasties.  Hips are well located.  Arthroplasty components both hips appear well-seated.  No acute fracture.  Impression: Status post left total hip arthroplasty 07/22/2020. Left IT band syndrome.  Plan: We will have her stop doing abduction exercises with the left hip.  She is to walk for exercise only.  Recommend heat to the lateral aspect of the hip.  She is on chronic steroids would not recommend upping the dose or any NSAIDs.  She has a follow-up appoint with Dr. Magnus Watson in 2 weeks she will follow-up with [place] then or sooner if her condition becomes worse.  Questions were encouraged and answered.

## 2020-08-24 ENCOUNTER — Telehealth: Payer: Self-pay | Admitting: Internal Medicine

## 2020-08-24 ENCOUNTER — Encounter: Payer: Self-pay | Admitting: Orthopaedic Surgery

## 2020-08-24 ENCOUNTER — Other Ambulatory Visit (HOSPITAL_COMMUNITY): Payer: Self-pay | Admitting: Internal Medicine

## 2020-08-24 DIAGNOSIS — Z1231 Encounter for screening mammogram for malignant neoplasm of breast: Secondary | ICD-10-CM

## 2020-08-24 NOTE — Telephone Encounter (Signed)
Called and spoke with pt letting her know that there are currently no samples of stiolto at [place] office but we did have samples at [facility] office of Stiolto. Pt said that she does not really want to have to drive to [place] to get samples and said that she will check back next week to see if they have received any samples. Nothing further needed.

## 2020-08-24 NOTE — Telephone Encounter (Signed)
Checked with Victorino Dike who is working at the [employer] [facility] today 5/18 to see if there were Stiolto samples there and per Victorino Dike, there are no Stiolto samples at [facility].  We do have samples of Stiolto at [facility] office if pt is okay driving to [place] to pick up the samples. Attempted to call pt to discuss this with her but unable to reach. Left message for her to return call.

## 2020-08-24 NOTE — Telephone Encounter (Signed)
Please see other encounter from today 5/18 in regards to stiolto samples. Closing this encounter as it is a duplicate.

## 2020-08-25 ENCOUNTER — Other Ambulatory Visit: Payer: Self-pay | Admitting: Orthopaedic Surgery

## 2020-08-25 MED ORDER — OXYCODONE HCL 5 MG PO TABS: 5.0000 mg | ORAL_TABLET | Freq: Four times a day (QID) | ORAL | 0 refills | Status: DC | PRN

## 2020-08-28 ENCOUNTER — Other Ambulatory Visit: Payer: Self-pay | Admitting: Internal Medicine

## 2020-08-31 ENCOUNTER — Encounter: Payer: Self-pay | Admitting: Orthopaedic Surgery

## 2020-08-31 ENCOUNTER — Ambulatory Visit (INDEPENDENT_AMBULATORY_CARE_PROVIDER_SITE_OTHER): Payer: 59

## 2020-08-31 ENCOUNTER — Ambulatory Visit (INDEPENDENT_AMBULATORY_CARE_PROVIDER_SITE_OTHER): Payer: 59 | Admitting: Orthopaedic Surgery

## 2020-08-31 DIAGNOSIS — Z96642 Presence of left artificial hip joint: Secondary | ICD-10-CM | POA: Diagnosis not present

## 2020-08-31 MED ORDER — GABAPENTIN 100 MG PO CAPS: 100.0000 mg | ORAL_CAPSULE | Freq: Three times a day (TID) | ORAL | 1 refills | Status: DC | PRN

## 2020-08-31 NOTE — Progress Notes (Signed)
The patient is now 4 months status post a right total hip arthroplasty but only if about 6 weeks status post a left total hip arthroplasty.  She is ambulating with a rolling walker but occasionally does use a cane.  She did have a fall yesterday.  She still been experiencing significant left hip IT band pain.  She is on a steroid daily.  We have had her on gabapentin 100 mg 3 times a day and she is requesting at least a small increase in that.  She is also been on oxycodone for pain which we refilled recently.  She tolerates me easily putting her right hip through range of motion.  The left hip is well located but definitely painful throughout its motion.  Most of her pain is on the IT band and trochanteric area.  An AP pelvis and lateral of the left hip shows a well-seated total hip arthroplasty bilaterally.  I see no acute findings or evidence of fracture in light of her fall.  I want her to still hold off on any exercises other than just balance and coordination and walking.  I have increased her Neurontin to 200 mg 3 times a day.  I would like to see her back in 4 weeks to see how she is doing overall but no x-rays are needed.  I would like to transition her to tramadol next after this last prescription of oxycodone.

## 2020-09-01 ENCOUNTER — Telehealth: Payer: Self-pay | Admitting: Internal Medicine

## 2020-09-01 NOTE — Telephone Encounter (Signed)
Left message for patient to call back  

## 2020-09-02 MED ORDER — STIOLTO RESPIMAT 2.5-2.5 MCG/ACT IN AERS: 2.0000 | INHALATION_SPRAY | Freq: Every day | RESPIRATORY_TRACT | 0 refills | Status: DC

## 2020-09-02 MED ORDER — PREDNISONE 10 MG PO TABS: 10.0000 mg | ORAL_TABLET | Freq: Every day | ORAL | 0 refills | Status: DC

## 2020-09-02 NOTE — Telephone Encounter (Signed)
Rx for prednisone 10mg  has been sent to preferred pharmacy, as patient was instructed to continue with medication at last OV.  She would like to pickup sample of stiolto from [place] office.   Routing to triage to place samples up front for pickup.

## 2020-09-02 NOTE — Telephone Encounter (Signed)
Called and spoke with patient and placed 2 samples of stiolto up front for pick up. Patient states may pick up today after 4pm or patient husband will come Tuesday (09/06/20) after 4pm to pick up. Patient states she has enough at home currently to last thru Tuesday.

## 2020-09-02 NOTE — Telephone Encounter (Signed)
Patient husband, Fayrene Fearing (per [facility]) came and picked up the 2 samples of stiolto for patient. Nothing further needed at this time.

## 2020-09-12 ENCOUNTER — Ambulatory Visit: Payer: 59 | Admitting: Urology

## 2020-09-28 ENCOUNTER — Encounter: Payer: Self-pay | Admitting: Orthopaedic Surgery

## 2020-09-28 ENCOUNTER — Ambulatory Visit (INDEPENDENT_AMBULATORY_CARE_PROVIDER_SITE_OTHER): Payer: 59 | Admitting: Orthopaedic Surgery

## 2020-09-28 DIAGNOSIS — M7061 Trochanteric bursitis, right hip: Secondary | ICD-10-CM | POA: Diagnosis not present

## 2020-09-28 DIAGNOSIS — Z96641 Presence of right artificial hip joint: Secondary | ICD-10-CM

## 2020-09-28 DIAGNOSIS — Z96642 Presence of left artificial hip joint: Secondary | ICD-10-CM

## 2020-09-28 MED ORDER — LIDOCAINE HCL 1 % IJ SOLN
3.0000 mL | INTRAMUSCULAR | Status: AC | PRN
  Administered 2020-09-28: 3 mL

## 2020-09-28 MED ORDER — GABAPENTIN 100 MG PO CAPS: 200.0000 mg | ORAL_CAPSULE | Freq: Three times a day (TID) | ORAL | 1 refills | Status: DC

## 2020-09-28 MED ORDER — METHYLPREDNISOLONE ACETATE 40 MG/ML IJ SUSP
40.0000 mg | INTRAMUSCULAR | Status: AC | PRN
  Administered 2020-09-28: 40 mg via INTRA_ARTICULAR

## 2020-09-28 NOTE — Progress Notes (Signed)
Office Visit Note   Patient: [name]           Date of Birth: 10/15/1965           MRN: [id number] Visit Date: 09/28/2020              Requested by: Benita Stabile, MD [address] [place],  [place] [address] PCP: Benita Stabile, MD   Assessment & Plan: Visit Diagnoses:  1. Status post left hip replacement   2. Status post right hip replacement   3. Trochanteric bursitis, right hip     Plan: She shown IT band stretching exercises.  Increased her gabapentin to 200 mg 3 times daily.  She is advised to begin taking vitamin B6 100 mg twice daily.  We will see her back in 1 month to see what type of progress she has made.  She tolerated the injection well today.  Questions encouraged and  Follow-Up Instructions: Return in about 4 weeks (around 10/26/2020).   Orders:  Orders Placed This Encounter  Procedures   Large Joint Inj: R greater trochanter    Meds ordered this encounter  Medications   gabapentin (NEURONTIN) 100 MG capsule    Sig: Take 2 capsules (200 mg total) by mouth 3 (three) times daily.    Dispense:  90 capsule    Refill:  1       Procedures: Large Joint Inj: R greater trochanter on 09/28/2020 11:23 AM Indications: pain Details: 22 G 1.5 in needle, lateral approach  Arthrogram: No  Medications: 3 mL lidocaine 1 %; 40 mg methylPREDNISolone acetate 40 MG/ML Outcome: tolerated well, no immediate complications Procedure, treatment alternatives, risks and benefits explained, specific risks discussed. Consent was given by the patient. Immediately prior to procedure a time out was called to verify the correct patient, procedure, equipment, support staff and site/side marked as required. Patient was prepped and draped in the usual sterile fashion.      Clinical Data: No additional findings.   Subjective: Chief Complaint  Patient presents with   Left Hip - Follow-up    HPI [name] returns today follow-up of her left total hip arthroplasty  which was performed on 07/22/2020.  She still has some numbness tingling about the hip and some pain over the IT band but she is having more pain in the right hip which she feels is nervelike pain.  She notes that her gabapentin was discussed at last visit but was not increased.  She states that she is tender to touch about the right hip.  She is on chronic prednisone.  She is still ambulating with a cane in her right hand.  Review of Systems  Constitutional:  Negative for chills and fever.    Objective: Vital Signs: There were no vitals taken for this visit.  Physical Exam General: Well-developed well-nourished somewhat frail-appearing female oxygen. Psych alert and oriented x3 Ortho Exam Bilateral hips excellent range of motion of both hips.  She has tenderness over the trochanteric region both hips right greater than left.  Tenderness down the entire IT band of the right hip.  Right hip surgical incisions well-healed no signs of infection. Specialty Comments:  No specialty comments available.  Imaging: No results found.   PMFS History: Patient Active Problem List   Diagnosis Date Noted   Status post left hip replacement 07/22/2020   Status post right hip replacement 05/02/2020   Status post total replacement of right hip 04/15/2020  Avascular necrosis of bone of left hip (HCC) 02/10/2020   Avascular necrosis of bone of right hip (HCC) 02/10/2020   Allergic rhinitis 05/21/2019   Chronic asthmatic bronchitis (HCC) 04/30/2019   Chronic respiratory failure with hypoxia (HCC) 04/30/2019   Spinal stenosis of cervical region 04/05/2017   Partial tear of right subscapularis tendon    Incisional hernia, without obstruction or gangrene    Urinary frequency 03/21/2012   Migraine equivalent syndrome 03/21/2012   Insomnia 03/21/2012   Past Medical History:  Diagnosis Date   Anemia    Anxiety    Arthritis    Asthma    Cervical radiculopathy    Cluster headaches    COPD (chronic  obstructive pulmonary disease) (HCC)    COPD (chronic obstructive pulmonary disease) with chronic bronchitis (HCC)    GERD (gastroesophageal reflux disease)    Hyperlipemia    Hypoglycemia    Hypothyroidism    Migraine    Pneumothorax    Pre-diabetes    Rectal discharge 07/28/2010   Shortness of breath    Sleep apnea    cannot tolerate-not using CPAP   Sluggishness 07/28/2010    Family History  Problem Relation Age of Onset   COPD Mother    Diabetes Mother    Hyperlipidemia Mother    Hypertension Mother    Bipolar disorder Son    Heart failure Maternal Grandmother    Hypertension Maternal Grandmother    Thyroid disease Maternal Grandmother    Diabetes Paternal Grandmother    Alzheimer's disease Paternal Grandmother    Heart failure Paternal Grandfather    Hypertension Paternal Grandfather    Hypertension Father    Colon cancer Neg Hx     Past Surgical History:  Procedure Laterality Date   ABDOMINAL HYSTERECTOMY     with bladder suspension   CERVICAL FUSION     CHOLECYSTECTOMY     COLONOSCOPY N/A 07/30/2013   Procedure: COLONOSCOPY;  Surgeon: Malissa Hippo, MD;  Location: [facility];  Service: Endoscopy;  Laterality: N/A;  930   EXAM UNDER ANESTHESIA WITH MANIPULATION OF SHOULDER Right 10/11/2016   Procedure: EXAM UNDER ANESTHESIA WITH MANIPULATION OF SHOULDER;  Surgeon: Vickki Hearing, MD;  Location: [facility];  Service: Orthopedics;  Laterality: Right;   HERNIA REPAIR     INCISIONAL HERNIA REPAIR N/A 07/30/2016   Procedure: HERNIA REPAIR INCISIONAL WITH MESH;  Surgeon: [name], MD;  Location: [facility];  Service: General;  Laterality: N/A;   LOBECTOMY     right lung    POSTERIOR CERVICAL LAMINECTOMY Right 04/05/2017   Procedure: Right C6-7 Posterior cervical laminectomy;  Surgeon: Donalee Citrin, MD;  Location: [facility] OR;  Service: Neurosurgery;  Laterality: Right;  Right C6-7 Posterior cervical laminectomy   SHOULDER ARTHROSCOPY WITH ROTATOR CUFF REPAIR Right 10/11/2016    Procedure: SHOULDER ARTHROSCOPY;  Surgeon: Vickki Hearing, MD;  Location: [facility];  Service: Orthopedics;  Laterality: Right;   TOTAL HIP ARTHROPLASTY Right 04/15/2020   Procedure: RIGHT TOTAL HIP ARTHROPLASTY ANTERIOR APPROACH;  Surgeon: Kathryne Hitch, MD;  Location: [facility];  Service: Orthopedics;  Laterality: Right;   TOTAL HIP ARTHROPLASTY Left 07/22/2020   Procedure: LEFT  HIP ARTHROPLASTY ANTERIOR APPROACH;  Surgeon: Kathryne Hitch, MD;  Location: [facility];  Service: Orthopedics;  Laterality: Left;  RNFA   TUBAL LIGATION     Social History   Occupational History   Not on file  Tobacco Use   Smoking status: Former    Packs/day: 1.00  Years: 30.00    Pack years: 30.00    Types: Cigarettes    Quit date: 02/21/2008    Years since quitting: 12.6   Smokeless tobacco: Never  Vaping Use   Vaping Use: Never used  Substance and Sexual Activity   Alcohol use: No    Alcohol/week: 0.0 standard drinks    Comment: no   Drug use: No   Sexual activity: Not Currently    Partners: Male    Birth control/protection: Surgical

## 2020-10-12 ENCOUNTER — Ambulatory Visit (HOSPITAL_COMMUNITY): Payer: 59

## 2020-10-13 ENCOUNTER — Ambulatory Visit (HOSPITAL_COMMUNITY)
Admission: RE | Admit: 2020-10-13 | Discharge: 2020-10-13 | Disposition: A | Discharge status: Home / Self Care | Payer: 59 | Source: Ambulatory Visit | Attending: Internal Medicine | Admitting: Internal Medicine

## 2020-10-13 DIAGNOSIS — F419 Anxiety disorder, unspecified: Secondary | ICD-10-CM | POA: Insufficient documentation

## 2020-10-13 DIAGNOSIS — Z1231 Encounter for screening mammogram for malignant neoplasm of breast: Secondary | ICD-10-CM | POA: Insufficient documentation

## 2020-10-19 DIAGNOSIS — J449 Chronic obstructive pulmonary disease, unspecified: Secondary | ICD-10-CM | POA: Insufficient documentation

## 2020-10-26 ENCOUNTER — Ambulatory Visit (INDEPENDENT_AMBULATORY_CARE_PROVIDER_SITE_OTHER): Payer: 59 | Admitting: Orthopaedic Surgery

## 2020-10-26 ENCOUNTER — Encounter: Payer: Self-pay | Admitting: Orthopaedic Surgery

## 2020-10-26 DIAGNOSIS — Z96642 Presence of left artificial hip joint: Secondary | ICD-10-CM

## 2020-10-26 DIAGNOSIS — Z96641 Presence of right artificial hip joint: Secondary | ICD-10-CM | POA: Diagnosis not present

## 2020-10-26 DIAGNOSIS — M7061 Trochanteric bursitis, right hip: Secondary | ICD-10-CM

## 2020-10-26 NOTE — Progress Notes (Signed)
He is coming in for follow-up as a relates to both her hips.  We replaced her right hip in January and her left hip in April.  She now has a more normal gait but she still walks with a limp.  Normal gait is more from her leg lengths now being equal.  We did inject her right hip trochanteric bursa just a month ago.  She still having some bursitis pain and thigh pain on the right side.  She is not able to go to physical therapy due to the cost of that.  She is on chronic oxygen as well.  Overall though she does look good.  She tolerates me easily putting both hips the range of motion with no difficulty at all.  Her pain is minimal.  Her leg lengths are equal.  She is walking without an assistive device.  This point she will continue increase her activities as she tolerates.  We will see her back in 3 months with a standing low AP pelvis.

## 2020-11-09 ENCOUNTER — Encounter: Payer: Self-pay | Admitting: Urology

## 2020-11-09 ENCOUNTER — Ambulatory Visit (INDEPENDENT_AMBULATORY_CARE_PROVIDER_SITE_OTHER): Payer: Medicare Other | Admitting: Urology

## 2020-11-09 ENCOUNTER — Other Ambulatory Visit: Payer: Self-pay

## 2020-11-09 VITALS — BP 96/64 | HR 103

## 2020-11-09 DIAGNOSIS — N3000 Acute cystitis without hematuria: Secondary | ICD-10-CM

## 2020-11-09 DIAGNOSIS — R32 Unspecified urinary incontinence: Secondary | ICD-10-CM | POA: Diagnosis not present

## 2020-11-09 LAB — MICROSCOPIC EXAMINATION: Renal Epithel, UA: NONE SEEN /hpf

## 2020-11-09 LAB — URINALYSIS, ROUTINE W REFLEX MICROSCOPIC
Bilirubin, UA: NEGATIVE
Glucose, UA: NEGATIVE
Ketones, UA: NEGATIVE
Leukocytes,UA: NEGATIVE
Nitrite, UA: POSITIVE — AB
Protein,UA: NEGATIVE
Specific Gravity, UA: 1.01 (ref 1.005–1.030)
Urobilinogen, Ur: 0.2 mg/dL (ref 0.2–1.0)
pH, UA: 6.5 (ref 5.0–7.5)

## 2020-11-09 LAB — BLADDER SCAN AMB NON-IMAGING: Scan Result: 0

## 2020-11-09 MED ORDER — GEMTESA 75 MG PO TABS: 1.0000 | ORAL_TABLET | Freq: Every day | ORAL | 0 refills | Status: DC

## 2020-11-09 NOTE — Patient Instructions (Signed)

## 2020-11-09 NOTE — Progress Notes (Signed)
post void residual=0  Urological Symptom Review  Patient is experiencing the following symptoms: Frequent urination Hard to postpone urination Get up at night to urinate Painful intercourse   Review of Systems  Gastrointestinal (upper)  : Nausea Indigestion/heartburn  Gastrointestinal (lower) : Constipation  Constitutional : Night Sweats Fatigue  Skin: Negative for skin symptoms  Eyes: Blurred vision  Ear/Nose/Throat : Negative for Ear/Nose/Throat symptoms  Hematologic/Lymphatic: Easy bruising  Cardiovascular : Negative for cardiovascular symptoms  Respiratory : Cough Shortness of breath  Endocrine: Excessive thirst (Sometimes)  Musculoskeletal: Back pain  Neurological: Negative for neurological symptoms  Psychologic: Depression Anxiety

## 2020-11-09 NOTE — Progress Notes (Signed)
11/09/2020 1:45 PM   Melissa Aline BrochureW Nation 05/17/1965 [id number]  Referring provider: Benita StabileHall, John Z, MD [address] [place] [id number]  Urinary incontinence   HPI: [name] is a 54yo here for evaluation of urge urinary incontinence. For over 5 years she has noted worsening urgency and urge incontinence. Over the past year her incontinence has worsened and she soaks 5-6 pads per day. She uses 1-2 pads per night. She has nocturia 3-4x. She has a hx of urethral sling by Dr. Jerre Watson in 2008. No hx of recurrent UTIs. She has previously tried mirabegron, detrol, vesicare, Gala Murdochtoviaz without improvement in her urgency and urge incontinence. She has constipation and has a bowel movement every 3 days.    PMH: Past Medical History:  Diagnosis Date   Anemia    Anxiety    Arthritis    Asthma    Cervical radiculopathy    Cluster headaches    COPD (chronic obstructive pulmonary disease) (HCC)    COPD (chronic obstructive pulmonary disease) with chronic bronchitis (HCC)    GERD (gastroesophageal reflux disease)    Hyperlipemia    Hypoglycemia    Hypothyroidism    Migraine    Pneumothorax    Pre-diabetes    Rectal discharge 07/28/2010   Shortness of breath    Sleep apnea    cannot tolerate-not using CPAP   Sluggishness 07/28/2010    Surgical History: Past Surgical History:  Procedure Laterality Date   ABDOMINAL HYSTERECTOMY     with bladder suspension   CERVICAL FUSION     CHOLECYSTECTOMY     COLONOSCOPY N/A 07/30/2013   Procedure: COLONOSCOPY;  Surgeon: Melissa HippoNajeeb U Rehman, MD;  [facility]: [facility];  Service: Endoscopy;  Laterality: N/A;  930   EXAM UNDER ANESTHESIA WITH MANIPULATION OF SHOULDER Right 10/11/2016   Procedure: EXAM UNDER ANESTHESIA WITH MANIPULATION OF SHOULDER;  Surgeon: Vickki HearingHarrison, Stanley E, MD;  Location: [facility];  Service: Orthopedics;  Laterality: Right;   HERNIA REPAIR     INCISIONAL HERNIA REPAIR N/A 07/30/2016   Procedure: HERNIA REPAIR INCISIONAL  WITH MESH;  Surgeon: Franky MachoMark Jenkins, MD;  Location: [facility];  Service: General;  Laterality: N/A;   LOBECTOMY     right lung    POSTERIOR CERVICAL LAMINECTOMY Right 04/05/2017   Procedure: Right C6-7 Posterior cervical laminectomy;  Surgeon: Donalee Citrinram, Gary, MD;  [facility];  Service: Neurosurgery;  Laterality: Right;  Right C6-7 Posterior cervical laminectomy   SHOULDER ARTHROSCOPY WITH ROTATOR CUFF REPAIR Right 10/11/2016   Procedure: SHOULDER ARTHROSCOPY;  Surgeon: Vickki HearingHarrison, Stanley E, MD;  Location: [facility];  Service: Orthopedics;  Laterality: Right;   TOTAL HIP ARTHROPLASTY Right 04/15/2020   Procedure: RIGHT TOTAL HIP ARTHROPLASTY ANTERIOR APPROACH;  Surgeon: Kathryne HitchBlackman, Christopher Y, MD;  Location: [facility];  Service: Orthopedics;  Laterality: Right;   TOTAL HIP ARTHROPLASTY Left 07/22/2020   Procedure: LEFT  HIP ARTHROPLASTY ANTERIOR APPROACH;  Surgeon: Kathryne HitchBlackman, Christopher Y, MD;  Location: [facility];  Service: Orthopedics;  Laterality: Left;  RNFA   TUBAL LIGATION      Home Medications:  Allergies as of 11/09/2020       Reactions   Aspirin Shortness Of Breath   Causes asthma flares    Penicillins Other (See Comments)   UNSPECIFIED REACTION OF CHILDHOOD Has patient had a PCN reaction causing immediate rash, facial/tongue/throat swelling, SOB or lightheadedness with hypotension: NO Has patient had a PCN reaction causing severe rash involving mucus membranes or skin necrosis: NO Has  patient had a PCN reaction that required hospitalization: no Has patient had a PCN reaction occurring within the last 10 years: NO If all of the above answers are "NO", then may proceed with Cephalosporin use.   Percocet [oxycodone-acetaminophen]    migraines   Tramadol    insomnia   Vicodin [hydrocodone-acetaminophen] Other (See Comments)   Headaches         Medication List        Accurate as of November 09, 2020  1:45 PM. If you have any questions, ask your nurse or doctor.          acetaminophen 500  MG tablet Commonly known as: TYLENOL Take 1,000 mg by mouth every 6 (six) hours as needed for moderate pain.   albuterol (2.5 MG/3ML) 0.083% nebulizer solution Commonly known as: PROVENTIL Take 3 mLs (2.5 mg total) by nebulization every 4 (four) hours as needed for wheezing or shortness of breath.   albuterol 108 (90 Base) MCG/ACT inhaler Commonly known as: VENTOLIN HFA Inhale 2 puffs into the lungs every 4 (four) hours as needed for shortness of breath.   aspirin-acetaminophen-caffeine 250-250-65 MG tablet Commonly known as: EXCEDRIN MIGRAINE Take 2 tablets by mouth every 8 (eight) hours as needed for migraine or headache.   bismuth subsalicylate 262 MG chewable tablet Commonly known as: PEPTO BISMOL Chew 524 mg by mouth daily as needed for indigestion.   calcium carbonate 750 MG chewable tablet Commonly known as: TUMS EX Chew 2 tablets by mouth daily as needed for heartburn.   cetirizine 10 MG tablet Commonly known as: ZYRTEC Take 10 mg by mouth daily.   citalopram 40 MG tablet Commonly known as: CELEXA Take 40 mg by mouth at bedtime.   docusate sodium 100 MG capsule Commonly known as: COLACE Take 100-200 mg by mouth daily as needed for mild constipation.   famotidine 20 MG tablet Commonly known as: PEPCID Take 20 mg by mouth daily as needed for heartburn or indigestion.   gabapentin 100 MG capsule Commonly known as: NEURONTIN Take 2 capsules (200 mg total) by mouth 3 (three) times daily.   levothyroxine 75 MCG tablet Commonly known as: SYNTHROID Take 0.5 tablets (37.5 mcg total) by mouth daily before breakfast.   LORazepam 1 MG tablet Commonly known as: ATIVAN Take 1 mg by mouth See admin instructions. Take 1 mg at bedtime, may take a second 1 mg dose during the day as needed for anxiety   montelukast 10 MG tablet Commonly known as: SINGULAIR Take 1 tablet (10 mg total) by mouth at bedtime.   NASACORT ALLERGY 24HR NA Place 2 sprays into the nose daily as  needed (allergies).   oxyCODONE 5 MG immediate release tablet Commonly known as: Oxy IR/ROXICODONE Take 1 tablet (5 mg total) by mouth every 6 (six) hours as needed for moderate pain (pain score 4-6).   pantoprazole 40 MG tablet Commonly known as: PROTONIX Take 30- 60 min before your first and last meals of the day What changed:  how much to take how to take this when to take this   pravastatin 40 MG tablet Commonly known as: PRAVACHOL Take 40 mg by mouth at bedtime.   predniSONE 10 MG tablet Commonly known as: DELTASONE Take 1 tablet (10 mg total) by mouth daily.   Stiolto Respimat 2.5-2.5 MCG/ACT Aers Generic drug: Tiotropium Bromide-Olodaterol Inhale 2 puffs into the lungs daily.   Stiolto Respimat 2.5-2.5 MCG/ACT Aers Generic drug: Tiotropium Bromide-Olodaterol Inhale 2 puffs into the lungs daily.  SUMAtriptan 6 MG/0.5ML Soaj Inject 6 mg as directed daily as needed (migraine).   valACYclovir 500 MG tablet Commonly known as: VALTREX Take 500-2,000 mg by mouth See admin instructions. Take 2000mg s at first sign of fever blister outbreak then take 500mg s daily until gone   Vitamin D3 Super Strength 50 MCG (2000 UT) Caps Generic drug: Cholecalciferol Take 2,000 Units by mouth daily.        Allergies:  Allergies  Allergen Reactions   Aspirin Shortness Of Breath    Causes asthma flares    Penicillins Other (See Comments)    UNSPECIFIED REACTION OF CHILDHOOD  Has patient had a PCN reaction causing immediate rash, facial/tongue/throat swelling, SOB or lightheadedness with hypotension: NO Has patient had a PCN reaction causing severe rash involving mucus membranes or skin necrosis: NO Has patient had a PCN reaction that required hospitalization: no Has patient had a PCN reaction occurring within the last 10 years: NO If all of the above answers are "NO", then may proceed with Cephalosporin use.    Percocet [Oxycodone-Acetaminophen]     migraines   Tramadol      insomnia   Vicodin [Hydrocodone-Acetaminophen] Other (See Comments)    Headaches     Family History: Family History  Problem Relation Age of Onset   COPD Mother    Diabetes Mother    Hyperlipidemia Mother    Hypertension Mother    Bipolar disorder Son    Heart failure Maternal Grandmother    Hypertension Maternal Grandmother    Thyroid disease Maternal Grandmother    Diabetes Paternal Grandmother    Alzheimer's disease Paternal Grandmother    Heart failure Paternal Grandfather    Hypertension Paternal Grandfather    Hypertension Father    Colon cancer Neg Hx     Social History:  reports that she quit smoking about 12 years ago. Her smoking use included cigarettes. She has a 30.00 pack-year smoking history. She has never used smokeless tobacco. She reports that she does not drink alcohol and does not use drugs.  ROS: All other review of systems were reviewed and are negative except what is noted above in HPI  Physical Exam: BP 96/64   Pulse (!) 103   Constitutional:  Alert and oriented, No acute distress. HEENT: [place] AT, moist mucus membranes.  Trachea midline, no masses. Cardiovascular: No clubbing, cyanosis, or edema. Respiratory: Normal respiratory effort, no increased work of breathing. GI: Abdomen is soft, nontender, nondistended, no abdominal masses GU: No CVA tenderness.  Lymph: No cervical or inguinal lymphadenopathy. Skin: No rashes, bruises or suspicious lesions. Neurologic: Grossly intact, no focal deficits, moving all 4 extremities. Psychiatric: Normal mood and affect.  Laboratory Data: Lab Results  Component Value Date   WBC 17.0 (H) 07/23/2020   HGB 8.6 (L) 07/23/2020   HCT 28.5 (L) 07/23/2020   MCV 95.0 07/23/2020   PLT 281 07/23/2020    Lab Results  Component Value Date   CREATININE 0.93 07/23/2020    No results found for: PSA  No results found for: TESTOSTERONE  Lab Results  Component Value Date   HGBA1C 6.0 (H) 07/13/2020     Urinalysis No results found for: COLORURINE, APPEARANCEUR, LABSPEC, PHURINE, GLUCOSEU, HGBUR, BILIRUBINUR, KETONESUR, PROTEINUR, UROBILINOGEN, NITRITE, LEUKOCYTESUR  No results found for: LABMICR, WBCUA, RBCUA, LABEPIT, MUCUS, BACTERIA  Pertinent Imaging:  Results for orders placed during the hospital encounter of 06/11/19  DG Abd 1 View  Narrative CLINICAL DATA:  Partial intestinal obstruction.  EXAM: ABDOMEN - 1 VIEW  COMPARISON:  None.  FINDINGS: Bowel gas pattern is normal without evidence of ileus or obstruction on this single view. No abnormal calcifications or acute bone findings.  IMPRESSION: Unremarkable one-view abdominal radiograph.   Electronically Signed By: Paulina Fusi M.D. On: 06/11/2019 15:39  No results found for this or any previous visit.  No results found for this or any previous visit.  No results found for this or any previous visit.  No results found for this or any previous visit.  No results found for this or any previous visit.  No results found for this or any previous visit.  No results found for this or any previous visit.   Assessment & Plan:    1. Urinary incontinence, unspecified type -we will trial gemtesa 75mg   - BLADDER SCAN AMB NON-IMAGING - Urinalysis, Routine w reflex microscopic  2. Acute cystitis without hematuria -urine for culture - Urine Culture   No follow-ups on file.  , MD  [facility] [place]

## 2020-11-11 ENCOUNTER — Telehealth: Payer: Self-pay | Admitting: Internal Medicine

## 2020-11-11 LAB — URINE CULTURE

## 2020-11-14 ENCOUNTER — Other Ambulatory Visit: Payer: Self-pay | Admitting: Internal Medicine

## 2020-11-14 ENCOUNTER — Telehealth: Payer: Self-pay

## 2020-11-14 NOTE — Telephone Encounter (Signed)
Called and spoke with patient who is asking for refill of her Prednisone and Singulair. She verified preferred pharmacy. Advised her to keep upcoming appointment with Dr. Sherene Sires. She expressed understanding. Refills have been sent in. Nothing further needed at this time.

## 2020-11-14 NOTE — Telephone Encounter (Signed)
Patient called today for urine culture results, reviewed with patient. Denies any urinary symptoms.  Patient also reports since starting Gemtesa samples, she has had a chronic migraine- patient reports history of migraines. Message sent to MD

## 2020-11-18 NOTE — Telephone Encounter (Signed)
My chart message sent. Samples left at desk.

## 2020-11-18 NOTE — Telephone Encounter (Signed)
Please advise on alternative.  

## 2020-11-23 ENCOUNTER — Other Ambulatory Visit: Payer: Self-pay

## 2020-11-23 ENCOUNTER — Encounter: Payer: Self-pay | Admitting: Allergy & Immunology

## 2020-11-23 ENCOUNTER — Ambulatory Visit: Payer: Medicare Other | Admitting: Allergy & Immunology

## 2020-11-23 VITALS — BP 110/64 | HR 87 | Temp 98.0°F | Resp 16 | Ht 64.5 in | Wt 150.2 lb

## 2020-11-23 DIAGNOSIS — J449 Chronic obstructive pulmonary disease, unspecified: Secondary | ICD-10-CM

## 2020-11-23 DIAGNOSIS — J3089 Other allergic rhinitis: Secondary | ICD-10-CM

## 2020-11-23 DIAGNOSIS — J302 Other seasonal allergic rhinitis: Secondary | ICD-10-CM | POA: Diagnosis not present

## 2020-11-23 MED ORDER — ALBUTEROL SULFATE HFA 108 (90 BASE) MCG/ACT IN AERS: 2.0000 | INHALATION_SPRAY | RESPIRATORY_TRACT | 1 refills | Status: DC | PRN

## 2020-11-23 MED ORDER — STIOLTO RESPIMAT 2.5-2.5 MCG/ACT IN AERS: 2.0000 | INHALATION_SPRAY | Freq: Every day | RESPIRATORY_TRACT | 1 refills | Status: DC

## 2020-11-23 NOTE — Patient Instructions (Addendum)
1. Asthma-COPD overlap syndrome - Lung testing not done today. - Continue with Stiolto two puffs once daily. - Consent for Harrington Challenger provided.  - Daily controller medication(s): Stiolto two puffs once daily + prednisone 10mg  daily + Singulair 10mg  daily - Rescue medications: albuterol nebulizer one vial every 4-6 hours as needed - Asthma control goals:  * Full participation in all desired activities (may need albuterol before activity) * Albuterol use two time or less a week on average (not counting use with activity) * Cough interfering with sleep two time or less a month * Oral steroids no more than once a year * No hospitalizations   2. Chronic rhinitis (horse, tobacco leaf, grasses, ragweed, trees, indoor molds, outdoor molds, dust mites, cat and dog) - Continue with: Zyrtec (cetirizine) 10mg  tablet once daily and Singulair (montelukast) 10mg  daily and Nasacort (triamcinolone) two sprays per nostril daily AS NEEDED - Continue with nasal saline rinses.  3. Return in about 4 weeks (around 12/21/2020).    Please inform of any Emergency Department visits, hospitalizations, or changes in symptoms. Call before going to the ED for breathing or allergy symptoms since we might be able to fit you in for a sick visit. Feel free to contact anytime with any questions, problems, or concerns.  It was a pleasure to see you again today!  Websites that have reliable patient information: 1. American Academy of Asthma, Allergy, and Immunology: www.aaaai.org 2. Food Allergy Research and Education (FARE): foodallergy.org 3. Mothers of Asthmatics: http://www.asthmacommunitynetwork.org 4. American College of Allergy, Asthma, and Immunology: www.acaai.org   COVID-19 Vaccine Information can be found at: 12/23/2020 For questions related to vaccine distribution or appointments, please email [email or web address] or call [phone].    We realize that you might be concerned about having an allergic reaction to the COVID19 vaccines. To help with that concern, WE ARE OFFERING THE COVID19 VACCINES IN OUR OFFICE! Ask the front desk for dates!     "Like" [place] on Facebook and Instagram for our latest updates!      A healthy democracy works best when [place] participate! Make sure you are registered to vote! If you have moved or changed any of your contact information, you will need to get this updated before voting!  In some cases, you MAY be able to register to vote online: [email or web address]

## 2020-11-23 NOTE — Progress Notes (Signed)
FOLLOW UP  Date of Service/Encounter:  11/23/20   Assessment:   Asthma-COPD overlap syndrome - steroid and oxygen depending and wanting to start a biologic (CBC with differential pending, but she has had elevated AEC in the past)   Seasonal and perennial allergic rhinitis (horse, tobacco leaf, grasses, ragweed, trees, indoor molds, outdoor molds, dust mites, cat and dog)   Onisability  Plan/Recommendations:   1. Asthma-COPD overlap syndrome - Lung testing not done today. - Continue with Stiolto two puffs once daily. - Consent for Harrington Challenger provided.  - Daily controller medication(s): Stiolto two puffs once daily + prednisone 10mg  daily + Singulair 10mg  daily - Rescue medications: albuterol nebulizer one vial every 4-6 hours as needed - Asthma control goals:  * Full participation in all desired activities (may need albuterol before activity) * Albuterol use two time or less a week on average (not counting use with activity) * Cough interfering with sleep two time or less a month * Oral steroids no more than once a year * No hospitalizations   2. Chronic rhinitis (horse, tobacco leaf, grasses, ragweed, trees, indoor molds, outdoor molds, dust mites, cat and dog) - Continue with: Zyrtec (cetirizine) 10mg  tablet once daily and Singulair (montelukast) 10mg  daily and Nasacort (triamcinolone) two sprays per nostril daily AS NEEDED - Continue with nasal saline rinses.  3. Return in about 4 weeks (around 12/21/2020).    Subjective:   [name] is a 55 y.o. female presenting today for follow up of  Chief Complaint  Patient presents with   Asthma    A lot of shortness of breath    Allergic Rhinitis     Runny nose and sneezing - causes asthma flares     [name] has a history of the following: Patient Active Problem List   Diagnosis Date Noted   Status post left hip replacement 07/22/2020   Status post right hip replacement 05/02/2020   Status post total  replacement of right hip 04/15/2020   Avascular necrosis of bone of left hip (HCC) 02/10/2020   Avascular necrosis of bone of right hip (HCC) 02/10/2020   Allergic rhinitis 05/21/2019   Chronic asthmatic bronchitis (HCC) 04/30/2019   Chronic respiratory failure with hypoxia (HCC) 04/30/2019   Spinal stenosis of cervical region 04/05/2017   Partial tear of right subscapularis tendon    Incisional hernia, without obstruction or gangrene    Urinary frequency 03/21/2012   Migraine equivalent syndrome 03/21/2012   Insomnia 03/21/2012    History obtained from: chart review and patient.  [name] is a 55 y.o. female presenting for a follow up visit.  She was last seen as a new patient in March 2020.  At that time, her COPD was only controlled with the Yupelri and albuterol.  She was about to lose her insurance and she had elevated eosinophils.  She also had multiple allergic sensitizations.  We continued her Zyrtec, Singulair, and Nasacort.  We added on Astelin.  She was asked to follow-up in 6 months and presents now nearly 2 years later.  In the interim, she has been so-so. She has been on oxygen since she was seen by Dr. 03/23/2012. Now that Dr. Melvenia Beam is gone, she sees Dr. 59. She was told that I could juts follow her instead of him. She is interested in coming off of prendisone. She has been on 10mg  since Dr. April 2020 put her on it in 2020. She was on Stiolto two puffs once daily. It seem to  work for a while and then it stops. She is on oxygen which is prescribed through her PCP. Overall her symptoms have worsened a bit since I last saw her. She remains interested in a biologic for management of her breathing, especially now that she has better coverage fori her healthcare. It sees that she has disability at this point.   She has avascular necrosis of both hips. She has had replacements this year. She is now on disability for her symptoms. She is interested in starting a biologic. She does not want  to be on systemic steroids at all.   She is on an antihistamine and montelukast for her nose.  She does not use a nose spray because she is on oxygen.  Otherwise, there have been no changes to her past medical history, surgical history, family history, or social history.    Review of Systems  Constitutional:  Positive for malaise/fatigue. Negative for chills, fever and weight loss.  HENT:  Positive for congestion. Negative for ear discharge and ear pain.   Eyes:  Negative for pain, discharge and redness.  Respiratory:  Positive for cough and shortness of breath. Negative for sputum production, wheezing and stridor.   Cardiovascular: Negative.  Negative for chest pain and palpitations.  Gastrointestinal:  Negative for abdominal pain, constipation, diarrhea, heartburn, nausea and vomiting.  Skin: Negative.  Negative for itching and rash.  Neurological:  Negative for dizziness and headaches.  Endo/Heme/Allergies:  Positive for environmental allergies. Does not bruise/bleed easily.      Objective:   Blood pressure 110/64, pulse 87, temperature 98 F (36.7 C), resp. rate 16, height 5' 4.5" (1.638 m), weight 150 lb 3.2 oz (68.1 kg), SpO2 98 %. Body mass index is 25.38 kg/m.   Physical Exam:  Physical Exam Constitutional:      Appearance: She is well-developed.     Comments: Wearing oxygen.   HENT:     Head: Normocephalic and atraumatic.     Right Ear: Tympanic membrane, ear canal and external ear normal.     Left Ear: Tympanic membrane, ear canal and external ear normal.     Nose: No nasal deformity, septal deviation, mucosal edema or rhinorrhea.     Right Turbinates: Enlarged and swollen.     Left Turbinates: Enlarged and swollen.     Right Sinus: No maxillary sinus tenderness or frontal sinus tenderness.     Left Sinus: No maxillary sinus tenderness or frontal sinus tenderness.     Mouth/Throat:     Mouth: Mucous membranes are not pale and not dry.     Pharynx: Uvula  midline.  Eyes:     General: Lids are normal. No allergic shiner.       Right eye: No discharge.        Left eye: No discharge.     Conjunctiva/sclera: Conjunctivae normal.     Right eye: Right conjunctiva is not injected. No chemosis.    Left eye: Left conjunctiva is not injected. No chemosis.    Pupils: Pupils are equal, round, and reactive to light.  Cardiovascular:     Rate and Rhythm: Normal rate and regular rhythm.     Heart sounds: Normal heart sounds.  Pulmonary:     Effort: Pulmonary effort is normal. Tachypnea present. No accessory muscle usage or respiratory distress.     Breath sounds: Normal breath sounds. No wheezing, rhonchi or rales.     Comments: Decreased air movement at the bases. Tachypneic.  Chest:  Chest wall: No tenderness.  Lymphadenopathy:     Cervical: No cervical adenopathy.  Skin:    General: Skin is warm.     Capillary Refill: Capillary refill takes less than 2 seconds.     Coloration: Skin is not pale.     Findings: No abrasion, erythema, petechiae or rash. Rash is not papular, urticarial or vesicular.     Comments: No eczematous lesions noted.   Neurological:     Mental Status: She is alert.  Psychiatric:        Behavior: Behavior is cooperative.     Diagnostic studies: none       Malachi Bonds, MD  [facility]

## 2020-11-24 ENCOUNTER — Telehealth: Payer: Self-pay | Admitting: *Deleted

## 2020-11-24 LAB — CBC WITH DIFFERENTIAL
Basophils Absolute: 0.1 10*3/uL (ref 0.0–0.2)
Basos: 1 %
EOS (ABSOLUTE): 0 10*3/uL (ref 0.0–0.4)
Eos: 0 %
Hematocrit: 34.1 % (ref 34.0–46.6)
Hemoglobin: 10.9 g/dL — ABNORMAL LOW (ref 11.1–15.9)
Immature Grans (Abs): 0.3 10*3/uL — ABNORMAL HIGH (ref 0.0–0.1)
Immature Granulocytes: 2 %
Lymphocytes Absolute: 1.8 10*3/uL (ref 0.7–3.1)
Lymphs: 13 %
MCH: 26.3 pg — ABNORMAL LOW (ref 26.6–33.0)
MCHC: 32 g/dL (ref 31.5–35.7)
MCV: 82 fL (ref 79–97)
Monocytes Absolute: 0.5 10*3/uL (ref 0.1–0.9)
Monocytes: 4 %
Neutrophils Absolute: 10.5 10*3/uL — ABNORMAL HIGH (ref 1.4–7.0)
Neutrophils: 80 %
RBC: 4.14 x10E6/uL (ref 3.77–5.28)
RDW: 15.3 % (ref 11.7–15.4)
WBC: 13.2 10*3/uL — ABNORMAL HIGH (ref 3.4–10.8)

## 2020-11-24 NOTE — Telephone Encounter (Signed)
-----   Message from Alfonse Spruce, MD sent at 11/23/2020 12:54 PM EDT ----- Harrington Challenger submission. She is steroid dependent. CBC with diff pending but AEC has been elevated in the past

## 2020-11-24 NOTE — Telephone Encounter (Signed)
L/m for patient to contact me to advise PAP from [place] most affordable route to get [facility]

## 2020-11-24 NOTE — Telephone Encounter (Signed)
Spoke to patient and discussed Harrington Challenger thru [place] for affordablity and app mailed to patient to return to me

## 2020-11-30 ENCOUNTER — Telehealth: Payer: Self-pay | Admitting: Orthopaedic Surgery

## 2020-11-30 NOTE — Telephone Encounter (Signed)
Appt made for patient

## 2020-11-30 NOTE — Telephone Encounter (Signed)
Pt called requesting an appt for bilateral knee pain from fall. T would like to be seen sometime next week. Please call pt if we can open a slot. Pt phone number is [phone].

## 2020-12-06 ENCOUNTER — Ambulatory Visit: Payer: Medicare Other | Admitting: Orthopaedic Surgery

## 2020-12-06 ENCOUNTER — Ambulatory Visit: Payer: Self-pay

## 2020-12-06 DIAGNOSIS — G8929 Other chronic pain: Secondary | ICD-10-CM | POA: Diagnosis not present

## 2020-12-06 DIAGNOSIS — M5441 Lumbago with sciatica, right side: Secondary | ICD-10-CM | POA: Diagnosis not present

## 2020-12-06 DIAGNOSIS — M7061 Trochanteric bursitis, right hip: Secondary | ICD-10-CM

## 2020-12-06 MED ORDER — METHOCARBAMOL 500 MG PO TABS: 500.0000 mg | ORAL_TABLET | Freq: Three times a day (TID) | ORAL | 1 refills | Status: DC

## 2020-12-06 MED ORDER — ACETAMINOPHEN-CODEINE #3 300-30 MG PO TABS: 1.0000 | ORAL_TABLET | ORAL | 0 refills | Status: DC | PRN

## 2020-12-06 MED ORDER — GABAPENTIN 100 MG PO CAPS: 200.0000 mg | ORAL_CAPSULE | Freq: Three times a day (TID) | ORAL | 1 refills | Status: DC

## 2020-12-06 NOTE — Progress Notes (Signed)
Office Visit Note   Patient: [name]           Date of Birth: 01/24/1966           MRN: [id number] Visit Date: 12/06/2020              Requested by: Benita Stabile, MD [address] [place],  [place] [address] PCP: Benita Stabile, MD   Assessment & Plan: Visit Diagnoses:  1. Trochanteric bursitis of right hip   2. Chronic right-sided low back pain with right-sided sciatica     Plan: We will send her to formal physical therapy for IT band stretching, core strengthening, hamstring stretching modalities and home exercise program.  We refilled her Neurontin.  Placed her on Robaxin that she is tolerated this well in the past.  Also gave her Tylenol 3 for severe pain.  She will follow-up with [place] in 1 month sooner if any questions or concerns.  Questions were encouraged and answered at length today.  Follow-Up Instructions: No follow-ups on file.   Orders:  Orders Placed This Encounter  Procedures   XR Lumbar Spine 2-3 Views   Meds ordered this encounter  Medications   gabapentin (NEURONTIN) 100 MG capsule    Sig: Take 2 capsules (200 mg total) by mouth 3 (three) times daily.    Dispense:  90 capsule    Refill:  1   methocarbamol (ROBAXIN) 500 MG tablet    Sig: Take 1 tablet (500 mg total) by mouth 3 (three) times daily.    Dispense:  40 tablet    Refill:  1   acetaminophen-codeine (TYLENOL #3) 300-30 MG tablet    Sig: Take 1-2 tablets by mouth every 4 (four) hours as needed for moderate pain.    Dispense:  20 tablet    Refill:  0       Procedures: No procedures performed   Clinical Data: No additional findings.   Subjective: Chief Complaint  Patient presents with   Right Leg - Pain    HPI [name] 55 year old female well-known to our department service comes in today with right leg hip pain sciatic-like pain along with low back pain.  She reports doing some exercises last Saturday and then developed back pain.  No other injury.  She has tried  Tylenol ibuprofen and heat without much relief.  Pain is worse with standing.  She does have difficulty sleeping due to the pain but pain does not awaken her.  No bowel or bladder changes.  No saddle anesthesia like symptoms.  Pains down the right leg laterally mostly to the calf but occasionally does shoot into the feet she does note some numbness tingling.  She is recently ran out of her gabapentin.  She is on chronic oxygen due to chronic respiratory failure.  She is on chronic steroids.  History of avascular necrosis bilateral hips and bilateral total hip arthroplasties.  Review of Systems  Constitutional:  Negative for chills and fever.  Musculoskeletal:  Positive for back pain.    Objective: Vital Signs: There were no vitals taken for this visit.  Physical Exam Constitutional:      Appearance: She is not ill-appearing or diaphoretic.  Cardiovascular:     Pulses: Normal pulses.  Neurological:     Mental Status: She is alert and oriented to person, place, and time.  Psychiatric:        Mood and Affect: Mood normal.    Ortho Exam Bilateral feet  sensation grossly intact.  She has 5 5 strength throughout lower extremities against resistance except for flexion of the right hip against resistance.  Positive straight leg raise on the right negative on the left.  Good range of motion of both hips external rotation of the right hip) discomfort.  She has tenderness over the trochanteric region right hip greater than left. Specialty Comments:  No specialty comments available.  Imaging: XR Lumbar Spine 2-3 Views  Result Date: 12/06/2020 Radiographs 2 views lumbar spine: Shows slight scoliosis.  Disc base overall well-maintained.  Lower lumbar facet joint arthritic changes consistent with findings on January 14, 2020 films.  No acute fracture.  No spinal listhesis.  No other bony abnormalities.  Arthrosclerosis aorta noted.    PMFS History: Patient Active Problem List   Diagnosis Date Noted    Status post left hip replacement 07/22/2020   Status post right hip replacement 05/02/2020   Status post total replacement of right hip 04/15/2020   Avascular necrosis of bone of left hip (HCC) 02/10/2020   Avascular necrosis of bone of right hip (HCC) 02/10/2020   Allergic rhinitis 05/21/2019   Chronic asthmatic bronchitis (HCC) 04/30/2019   Chronic respiratory failure with hypoxia (HCC) 04/30/2019   Spinal stenosis of cervical region 04/05/2017   Partial tear of right subscapularis tendon    Incisional hernia, without obstruction or gangrene    Urinary frequency 03/21/2012   Migraine equivalent syndrome 03/21/2012   Insomnia 03/21/2012   Past Medical History:  Diagnosis Date   Anemia    Anxiety    Arthritis    Asthma    Cervical radiculopathy    Cluster headaches    COPD (chronic obstructive pulmonary disease) (HCC)    COPD (chronic obstructive pulmonary disease) with chronic bronchitis (HCC)    GERD (gastroesophageal reflux disease)    Hyperlipemia    Hypoglycemia    Hypothyroidism    Migraine    Pneumothorax    Pre-diabetes    Rectal discharge 07/28/2010   Shortness of breath    Sleep apnea    cannot tolerate-not using CPAP   Sluggishness 07/28/2010    Family History  Problem Relation Age of Onset   COPD Mother    Diabetes Mother    Hyperlipidemia Mother    Hypertension Mother    Bipolar disorder Son    Heart failure Maternal Grandmother    Hypertension Maternal Grandmother    Thyroid disease Maternal Grandmother    Diabetes Paternal Grandmother    Alzheimer's disease Paternal Grandmother    Heart failure Paternal Grandfather    Hypertension Paternal Grandfather    Hypertension Father    Colon cancer Neg Hx     Past Surgical History:  Procedure Laterality Date   ABDOMINAL HYSTERECTOMY     with bladder suspension   CERVICAL FUSION     CHOLECYSTECTOMY     COLONOSCOPY N/A 07/30/2013   Procedure: COLONOSCOPY;  Surgeon: Malissa Hippo, MD;  Location: [facility];  Service: Endoscopy;  Laterality: N/A;  930   EXAM UNDER ANESTHESIA WITH MANIPULATION OF SHOULDER Right 10/11/2016   Procedure: EXAM UNDER ANESTHESIA WITH MANIPULATION OF SHOULDER;  Surgeon: Vickki Hearing, MD;  Location: [facility];  Service: Orthopedics;  Laterality: Right;   HERNIA REPAIR     INCISIONAL HERNIA REPAIR N/A 07/30/2016   Procedure: HERNIA REPAIR INCISIONAL WITH MESH;  Surgeon: [name], MD;  Location: [facility];  Service: General;  Laterality: N/A;   LOBECTOMY     right  lung    POSTERIOR CERVICAL LAMINECTOMY Right 04/05/2017   Procedure: Right C6-7 Posterior cervical laminectomy;  Surgeon: Donalee Citrin, MD;  Location: [facility] OR;  Service: Neurosurgery;  Laterality: Right;  Right C6-7 Posterior cervical laminectomy   SHOULDER ARTHROSCOPY WITH ROTATOR CUFF REPAIR Right 10/11/2016   Procedure: SHOULDER ARTHROSCOPY;  Surgeon: Vickki Hearing, MD;  Location: [facility];  Service: Orthopedics;  Laterality: Right;   TOTAL HIP ARTHROPLASTY Right 04/15/2020   Procedure: RIGHT TOTAL HIP ARTHROPLASTY ANTERIOR APPROACH;  Surgeon: Kathryne Hitch, MD;  Location: [facility];  Service: Orthopedics;  Laterality: Right;   TOTAL HIP ARTHROPLASTY Left 07/22/2020   Procedure: LEFT  HIP ARTHROPLASTY ANTERIOR APPROACH;  Surgeon: Kathryne Hitch, MD;  Location: [facility];  Service: Orthopedics;  Laterality: Left;  RNFA   TUBAL LIGATION     Social History   Occupational History   Not on file  Tobacco Use   Smoking status: Former    Packs/day: 1.00    Years: 30.00    Pack years: 30.00    Types: Cigarettes    Quit date: 02/21/2008    Years since quitting: 12.8   Smokeless tobacco: Never  Vaping Use   Vaping Use: Never used  Substance and Sexual Activity   Alcohol use: No    Alcohol/week: 0.0 standard drinks    Comment: no   Drug use: No   Sexual activity: Not Currently    Partners: Male    Birth control/protection: Surgical

## 2020-12-06 NOTE — Addendum Note (Signed)
Addended by: Barbette Or on: 12/06/2020 10:13 AM   Modules accepted: Orders

## 2020-12-07 ENCOUNTER — Ambulatory Visit: Payer: Medicare Other | Admitting: Urology

## 2020-12-07 ENCOUNTER — Telehealth: Payer: Self-pay | Admitting: Physician Assistant

## 2020-12-07 ENCOUNTER — Telehealth: Payer: Self-pay

## 2020-12-07 ENCOUNTER — Other Ambulatory Visit: Payer: Self-pay

## 2020-12-07 ENCOUNTER — Other Ambulatory Visit: Payer: Self-pay | Admitting: Internal Medicine

## 2020-12-07 ENCOUNTER — Encounter: Payer: Self-pay | Admitting: Urology

## 2020-12-07 VITALS — BP 96/61 | HR 89

## 2020-12-07 DIAGNOSIS — R32 Unspecified urinary incontinence: Secondary | ICD-10-CM

## 2020-12-07 LAB — BLADDER SCAN AMB NON-IMAGING: Scan Result: 129

## 2020-12-07 MED ORDER — METHOCARBAMOL 500 MG PO TABS: 500.0000 mg | ORAL_TABLET | Freq: Three times a day (TID) | ORAL | 0 refills | Status: DC

## 2020-12-07 MED ORDER — METHOCARBAMOL 500 MG PO TABS: 500.0000 mg | ORAL_TABLET | Freq: Three times a day (TID) | ORAL | 1 refills | Status: DC

## 2020-12-07 NOTE — Telephone Encounter (Signed)
Re-sent rx to the pharmacy 

## 2020-12-07 NOTE — Telephone Encounter (Signed)
Patient advise to drop off specimen if she was having any urinary discomfort. Patient states she is not having any urinary issues and will call back if needed.

## 2020-12-07 NOTE — Progress Notes (Signed)
post void residual=129  Urological Symptom Review  Patient is experiencing the following symptoms: Frequent urination Hard to postpone urination Get up at night to urinate Leakage of urine Painful intercourse   Review of Systems  Gastrointestinal (upper)  : Nausea Indigestion/heartburn  Gastrointestinal (lower) : Diarrhea Constipation  Constitutional : Night Sweats Fatigue  Skin: Itching  Eyes: Blurred vision  Ear/Nose/Throat : Negative for Ear/Nose/Throat symptoms  Hematologic/Lymphatic: Negative for Hematologic/Lymphatic symptoms  Cardiovascular : Negative for cardiovascular symptoms  Respiratory : Cough Shortness of breath  Endocrine: Negative for endocrine symptoms  Musculoskeletal: Back pain Joint pain  Neurological: Headaches  Psychologic: Anxiety

## 2020-12-07 NOTE — Telephone Encounter (Signed)
Called and advised.

## 2020-12-07 NOTE — Telephone Encounter (Signed)
Needing a call about urine specimen.  She didn't give one at today's visit.  Please advise.  Call back:  [phone]  Thanks, Rosey Bath

## 2020-12-07 NOTE — Patient Instructions (Addendum)
Botulinum Toxin Bladder Injection A botulinum toxin bladder injection is a procedure to treat an overactive bladder. During the procedure, a drug called botulinum toxin is injected into the bladder through a long, thin needle. This drug relaxes the bladder muscles and reduces overactivity. You may need this procedure if your medicines are not working or you cannot take them. The procedure may be repeated as needed. The treatment usually lasts for 6 months. Your health care provider will monitor you to see how well you respond. Tell a health care provider about: Any allergies you have. All medicines you are taking, including vitamins, herbs, eye drops, creams, and over-the-counter medicines. Any problems you or family members have had with anesthetic medicines. Any blood disorders you have. Any surgeries you have had. Any medical conditions you have. Any previous reactions to a botulinum toxin injection. Any symptoms of urinary tract infection. These include chills, fever, a burning feeling when passing urine, and needing to pass urine often. Whether you are pregnant or may be pregnant. What are the risks? Generally this is a safe procedure. However, problems may occur, including: Not being able to pass urine. If this happens, you may need to have your bladder emptied with a thin tube inserted into your urethra (urinary catheter). Bleeding. Urinary tract infection. Allergic reaction to the botulinum toxin. Pain or burning when passing urine. Damage to other structures or organs. What happens before the procedure? Staying hydrated Follow instructions from your health care provider about hydration, which may include: Up to 2 hours before the procedure - you may continue to drink clear liquids, such as water, clear fruit juice, black coffee, and plain tea. Eating and drinking restrictions Follow instructions from your health care provider about eating and drinking, which may include: 8 hours  before the procedure - stop eating heavy meals or foods, such as meat, fried foods, or fatty foods. 6 hours before the procedure - stop eating light meals or foods, such as toast or cereal. 6 hours before the procedure - stop drinking milk or drinks that contain milk. 2 hours before the procedure - stop drinking clear liquids. Medicines Ask your health care provider about: Changing or stopping your regular medicines. This is especially important if you are taking diabetes medicines or blood thinners. Taking medicines such as aspirin and ibuprofen. These medicines can thin your blood. Do not take these medicines unless your health care provider tells you to take them. Taking over-the-counter medicines, vitamins, herbs, and supplements. General instructions Plan to have someone take you home from the hospital or clinic. If you will be going home right after the procedure, plan to have someone with you for 24 hours. Ask your health care provider what steps will be taken to help prevent infection. These may include: Removing hair at the procedure site. Washing skin with a germ-killing soap. Antibiotic medicine. What happens during the procedure?  You will be asked to empty your bladder. An IV will be inserted into one of your veins. You will be given one or more of the following: A medicine to help you relax (sedative). A medicine to numb the area (local anesthetic). A medicine to make you fall asleep (general anesthetic). A long, thin scope called a cystoscope will be passed into your bladder through the part of the body that carries urine from your bladder (urethra). The cystoscope will be used to fill your bladder with water. A long needle will be passed through the cystoscope and into the bladder. The botulinum toxin  will be injected into your bladder. It may be injected into multiple areas of your bladder. Your bladder will be emptied, and the cystoscope will be removed. The procedure  may vary among health care providers and hospitals. What can I expect after procedure? After your procedure, it is common to have: Blood-tinged urine. Burning or soreness when you pass urine. Follow these instructions at home: Medicines Take over-the-counter and prescription medicines only as told by your health care provider. If you were prescribed an antibiotic medicine, take it as told by your health care provider. Do not stop taking the antibiotic even if you start to feel better. General instructions  Do not drive for 24 hours if you were given a sedative during your procedure. Drink enough fluid to keep your urine pale yellow. Return to your normal activities as told by your health care provider. Ask your health care provider what activities are safe for you. Keep all follow-up visits as told by your health care provider. This is important. Contact a health care provider if you have: A fever or chills. Blood-tinged urine for more than one day after your procedure. Worsening pain or burning when you pass urine. Pain or burning when passing urine for more than two days after your procedure. Trouble emptying your bladder. Get help right away if you: Have bright red blood in your urine. Are unable to pass urine. Summary A botulinum toxin bladder injection is a procedure to treat an overactive bladder. This is generally a very safe procedure. However, problems may occur, including not being able to pass urine, bleeding, infection, pain, and allergic reactions to medicines. You will be told when to stop eating and drinking, and what medicines to change or stop. Follow instructions carefully. After the procedure, it is common to have blood in urine and to have soreness or burning when passing urine. Contact a health care provider if you have a fever, have blood in urine for more than a few days, or have trouble passing urine. Get help right away if you have bright red blood in the urine,  or if you are unable to pass urine. This information is not intended to replace advice given to you by your health care provider. Make sure you discuss any questions you have with your health care provider. Document Revised: 06/15/2020 Document Reviewed: 10/04/2017 Elsevier Patient Education  2022 [employer] Overactive Bladder, Adult Overactive bladder is a condition in which a person has a sudden and frequent need to urinate. A person might also leak urine if he or she cannot get to the bathroom fast enough (urinary incontinence). Sometimes, symptoms can interfere with work or social activities. What are the causes? Overactive bladder is associated with poor nerve signals between your bladder and your brain. Your bladder may get the signal to empty before it is full. You may also have very sensitive muscles that make your bladder squeeze too soon. This condition may also be caused by other factors, such as: Medical conditions: Urinary tract infection. Infection of nearby tissues. Prostate enlargement. Bladder stones, inflammation, or tumors. Diabetes. Muscle or nerve weakness, especially from these conditions: A spinal cord injury. Stroke. Multiple sclerosis. Parkinson's disease. Other causes: Surgery on the uterus or urethra. Drinking too much caffeine or alcohol. Certain medicines, especially those that eliminate extra fluid in the body (diuretics). Constipation. What increases the risk? You may be at greater risk for overactive bladder if you: Are an older adult. Smoke. Are going through menopause. Have prostate problems. Have  a neurological disease, such as stroke, dementia, Parkinson's disease, or multiple sclerosis (MS). Eat or drink alcohol, spicy food, caffeine, and other things that irritate the bladder. Are overweight or obese. What are the signs or symptoms? Symptoms of this condition include a sudden, strong urge to urinate. Other symptoms include: Leaking  urine. Urinating 8 or more times a day. Waking up to urinate 2 or more times overnight. How is this diagnosed? This condition may be diagnosed based on: Your symptoms and medical history. A physical exam. Blood or urine tests to check for possible causes, such as infection. You may also need to see a health care provider who specializes in urinary tract problems. This is called a urologist. How is this treated? Treatment for overactive bladder depends on the cause of your condition and whether it is mild or severe. Treatment may include: Bladder training, such as: Learning to control the urge to urinate by following a schedule to urinate at regular intervals. Doing Kegel exercises to strengthen the pelvic floor muscles that support your bladder. Special devices, such as: Biofeedback. This uses sensors to help you become aware of your body's signals. Electrical stimulation. This uses electrodes placed inside the body (implanted) or outside the body. These electrodes send gentle pulses of electricity to strengthen the nerves or muscles that control the bladder. Women may use a plastic device, called a pessary, that fits into the vagina and supports the bladder. Medicines, such as: Antibiotics to treat bladder infection. Antispasmodics to stop the bladder from releasing urine at the wrong time. Tricyclic antidepressants to relax bladder muscles. Injections of botulinum toxin type A directly into the bladder tissue to relax bladder muscles. Surgery, such as: A device may be implanted to help manage the nerve signals that control urination. An electrode may be implanted to stimulate electrical signals in the bladder. A procedure may be done to change the shape of the bladder. This is done only in very severe cases. Follow these instructions at home: Eating and drinking  Make diet or lifestyle changes recommended by your health care provider. These may include: Drinking fluids throughout the  day and not only with meals. Cutting down on caffeine or alcohol. Eating a healthy and balanced diet to prevent constipation. This may include: Choosing foods that are high in fiber, such as beans, whole grains, and fresh fruits and vegetables. Limiting foods that are high in fat and processed sugars, such as fried and sweet foods. Lifestyle  Lose weight if needed. Do not use any products that contain nicotine or tobacco. These include cigarettes, chewing tobacco, and vaping devices, such as e-cigarettes. If you need help quitting, ask your health care provider. General instructions Take over-the-counter and prescription medicines only as told by your health care provider. If you were prescribed an antibiotic medicine, take it as told by your health care provider. Do not stop taking the antibiotic even if you start to feel better. Use any implants or pessary as told by your health care provider. If needed, wear pads to absorb urine leakage. Keep a log to track how much and when you drink, and when you need to urinate. This will help your health care provider monitor your condition. Keep all follow-up visits. This is important. Contact a health care provider if: You have a fever or chills. Your symptoms do not get better with treatment. Your pain and discomfort get worse. You have more frequent urges to urinate. Get help right away if: You are not able to control  your bladder. Summary Overactive bladder refers to a condition in which a person has a sudden and frequent need to urinate. Several conditions may lead to an overactive bladder. Treatment for overactive bladder depends on the cause and severity of your condition. Making lifestyle changes, doing Kegel exercises, keeping a log, and taking medicines can help with this condition. This information is not intended to replace advice given to you by your health care provider. Make sure you discuss any questions you have with your health  care provider. Document Revised: 12/14/2019 Document Reviewed: 12/14/2019 Elsevier Patient Education  2022 [employer].

## 2020-12-07 NOTE — Progress Notes (Signed)
12/07/2020 10:11 AM   Melvenia Beam Aline Brochure 10/18/1965 [id number]  Referring provider: Benita Stabile, MD [address] [place],  [place] [address]  Followup urge incontinence   HPI: [name] is 89ME here for followup for urge incontinence and OAB. She was prescribed gemtesa 75mg  last visit which failed to improve her urgency, urge incontinence and urinary frequency. She uses 4-6 pads per day which are soaked. She is very unhappy with her urinary symptoms. No other complaints today.    PMH: Past Medical History:  Diagnosis Date   Anemia    Anxiety    Arthritis    Asthma    Cervical radiculopathy    Cluster headaches    COPD (chronic obstructive pulmonary disease) (HCC)    COPD (chronic obstructive pulmonary disease) with chronic bronchitis (HCC)    GERD (gastroesophageal reflux disease)    Hyperlipemia    Hypoglycemia    Hypothyroidism    Migraine    Pneumothorax    Pre-diabetes    Rectal discharge 07/28/2010   Shortness of breath    Sleep apnea    cannot tolerate-not using CPAP   Sluggishness 07/28/2010    Surgical History: Past Surgical History:  Procedure Laterality Date   ABDOMINAL HYSTERECTOMY     with bladder suspension   CERVICAL FUSION     CHOLECYSTECTOMY     COLONOSCOPY N/A 07/30/2013   Procedure: COLONOSCOPY;  Surgeon: 08/01/2013, MD;  [facility];  Service: Endoscopy;  Laterality: N/A;  930   EXAM UNDER ANESTHESIA WITH MANIPULATION OF SHOULDER Right 10/11/2016   Procedure: EXAM UNDER ANESTHESIA WITH MANIPULATION OF SHOULDER;  Surgeon: 12/12/2016, MD;  Location: [facility];  Service: Orthopedics;  Laterality: Right;   HERNIA REPAIR     INCISIONAL HERNIA REPAIR N/A 07/30/2016   Procedure: HERNIA REPAIR INCISIONAL WITH MESH;  Surgeon: 08/01/2016, MD;  Location: [facility];  Service: General;  Laterality: N/A;   LOBECTOMY     right lung    POSTERIOR CERVICAL LAMINECTOMY Right 04/05/2017   Procedure: Right C6-7 Posterior cervical  laminectomy;  Surgeon: 04/07/2017, MD;  Location: [facility] OR;  Service: Neurosurgery;  Laterality: Right;  Right C6-7 Posterior cervical laminectomy   SHOULDER ARTHROSCOPY WITH ROTATOR CUFF REPAIR Right 10/11/2016   Procedure: SHOULDER ARTHROSCOPY;  Surgeon: 12/12/2016, MD;  Location: [facility];  Service: Orthopedics;  Laterality: Right;   TOTAL HIP ARTHROPLASTY Right 04/15/2020   Procedure: RIGHT TOTAL HIP ARTHROPLASTY ANTERIOR APPROACH;  Surgeon: 06/13/2020, MD;  [facility]: [facility];  Service: Orthopedics;  Laterality: Right;   TOTAL HIP ARTHROPLASTY Left 07/22/2020   Procedure: LEFT  HIP ARTHROPLASTY ANTERIOR APPROACH;  Surgeon: 07/24/2020, MD;  [facility];  Service: Orthopedics;  Laterality: Left;  RNFA   TUBAL LIGATION      Home Medications:  Allergies as of 12/07/2020       Reactions   Aspirin Shortness Of Breath   Causes asthma flares    Penicillins Other (See Comments)   UNSPECIFIED REACTION OF CHILDHOOD Has patient had a PCN reaction causing immediate rash, facial/tongue/throat swelling, SOB or lightheadedness with hypotension: NO Has patient had a PCN reaction causing severe rash involving mucus membranes or skin necrosis: NO Has patient had a PCN reaction that required hospitalization: no Has patient had a PCN reaction occurring within the last 10 years: NO If all of the above answers are "NO", then may proceed with Cephalosporin use.   Percocet [oxycodone-acetaminophen]  migraines   Tramadol    insomnia   Vicodin [hydrocodone-acetaminophen] Other (See Comments)   Headaches         Medication List        Accurate as of December 07, 2020 10:11 AM. If you have any questions, ask your nurse or doctor.          acetaminophen 500 MG tablet Commonly known as: TYLENOL Take 1,000 mg by mouth every 6 (six) hours as needed for moderate pain.   acetaminophen-codeine 300-30 MG tablet Commonly known as: TYLENOL #3 Take 1-2 tablets by mouth every  4 (four) hours as needed for moderate pain.   albuterol 108 (90 Base) MCG/ACT inhaler Commonly known as: VENTOLIN HFA Inhale 2 puffs into the lungs every 4 (four) hours as needed for shortness of breath.   aspirin-acetaminophen-caffeine 250-250-65 MG tablet Commonly known as: EXCEDRIN MIGRAINE Take 2 tablets by mouth every 8 (eight) hours as needed for migraine or headache.   calcium carbonate 750 MG chewable tablet Commonly known as: TUMS EX Chew 2 tablets by mouth daily as needed for heartburn.   cetirizine 10 MG tablet Commonly known as: ZYRTEC Take 10 mg by mouth daily.   citalopram 40 MG tablet Commonly known as: CELEXA Take 40 mg by mouth at bedtime.   docusate sodium 100 MG capsule Commonly known as: COLACE Take 100-200 mg by mouth daily as needed for mild constipation.   famotidine 20 MG tablet Commonly known as: PEPCID Take 20 mg by mouth daily as needed for heartburn or indigestion.   gabapentin 100 MG capsule Commonly known as: NEURONTIN Take 2 capsules (200 mg total) by mouth 3 (three) times daily.   levothyroxine 75 MCG tablet Commonly known as: SYNTHROID Take 0.5 tablets (37.5 mcg total) by mouth daily before breakfast.   LORazepam 1 MG tablet Commonly known as: ATIVAN Take 1 mg by mouth See admin instructions. Take 1 mg at bedtime, may take a second 1 mg dose during the day as needed for anxiety   methocarbamol 500 MG tablet Commonly known as: Robaxin Take 1 tablet (500 mg total) by mouth 3 (three) times daily.   montelukast 10 MG tablet Commonly known as: SINGULAIR TAKE 1 TABLET BY MOUTH AT BEDTIME   multivitamin tablet Take 1 tablet by mouth daily.   NASACORT ALLERGY 24HR NA Place 2 sprays into the nose daily as needed (allergies).   OneTouch Delica Plus Lancet33G Misc Apply topically.   pantoprazole 40 MG tablet Commonly known as: PROTONIX Take 30- 60 min before your first and last meals of the day What changed:  how much to take how  to take this when to take this   pravastatin 40 MG tablet Commonly known as: PRAVACHOL Take 40 mg by mouth at bedtime.   predniSONE 10 MG tablet Commonly known as: DELTASONE Take 1 tablet by mouth once daily   Stiolto Respimat 2.5-2.5 MCG/ACT Aers Generic drug: Tiotropium Bromide-Olodaterol Inhale 2 puffs into the lungs daily.   SUMAtriptan 6 MG/0.5ML Soaj Inject 6 mg as directed daily as needed (migraine).   valACYclovir 500 MG tablet Commonly known as: VALTREX Take 500-2,000 mg by mouth See admin instructions. Take 2000mg s at first sign of fever blister outbreak then take 500mg s daily until gone   Vitamin D3 Super Strength 50 MCG (2000 UT) Caps Generic drug: Cholecalciferol Take 2,000 Units by mouth daily.        Allergies:  Allergies  Allergen Reactions   Aspirin Shortness Of Breath  Causes asthma flares    Penicillins Other (See Comments)    UNSPECIFIED REACTION OF CHILDHOOD  Has patient had a PCN reaction causing immediate rash, facial/tongue/throat swelling, SOB or lightheadedness with hypotension: NO Has patient had a PCN reaction causing severe rash involving mucus membranes or skin necrosis: NO Has patient had a PCN reaction that required hospitalization: no Has patient had a PCN reaction occurring within the last 10 years: NO If all of the above answers are "NO", then may proceed with Cephalosporin use.    Percocet [Oxycodone-Acetaminophen]     migraines   Tramadol     insomnia   Vicodin [Hydrocodone-Acetaminophen] Other (See Comments)    Headaches     Family History: Family History  Problem Relation Age of Onset   COPD Mother    Diabetes Mother    Hyperlipidemia Mother    Hypertension Mother    Bipolar disorder Son    Heart failure Maternal Grandmother    Hypertension Maternal Grandmother    Thyroid disease Maternal Grandmother    Diabetes Paternal Grandmother    Alzheimer's disease Paternal Grandmother    Heart failure Paternal  Grandfather    Hypertension Paternal Grandfather    Hypertension Father    Colon cancer Neg Hx     Social History:  reports that she quit smoking about 12 years ago. Her smoking use included cigarettes. She has a 30.00 pack-year smoking history. She has never used smokeless tobacco. She reports that she does not drink alcohol and does not use drugs.  ROS: All other review of systems were reviewed and are negative except what is noted above in HPI  Physical Exam: BP 96/61   Pulse 89   Constitutional:  Alert and oriented, No acute distress. HEENT: [place] AT, moist mucus membranes.  Trachea midline, no masses. Cardiovascular: No clubbing, cyanosis, or edema. Respiratory: Normal respiratory effort, no increased work of breathing. GI: Abdomen is soft, nontender, nondistended, no abdominal masses GU: No CVA tenderness.  Lymph: No cervical or inguinal lymphadenopathy. Skin: No rashes, bruises or suspicious lesions. Neurologic: Grossly intact, no focal deficits, moving all 4 extremities. Psychiatric: Normal mood and affect.  Laboratory Data: Lab Results  Component Value Date   WBC 13.2 (H) 11/23/2020   HGB 10.9 (L) 11/23/2020   HCT 34.1 11/23/2020   MCV 82 11/23/2020   PLT 281 07/23/2020    Lab Results  Component Value Date   CREATININE 0.93 07/23/2020    No results found for: PSA  No results found for: TESTOSTERONE  Lab Results  Component Value Date   HGBA1C 6.0 (H) 07/13/2020    Urinalysis    Component Value Date/Time   APPEARANCEUR Clear 11/09/2020 1333   GLUCOSEU Negative 11/09/2020 1333   BILIRUBINUR Negative 11/09/2020 1333   PROTEINUR Negative 11/09/2020 1333   NITRITE Positive (A) 11/09/2020 1333   LEUKOCYTESUR Negative 11/09/2020 1333    Lab Results  Component Value Date   LABMICR See below: 11/09/2020   WBCUA 6-10 (A) 11/09/2020   LABEPIT 0-10 11/09/2020   BACTERIA Many (A) 11/09/2020    Pertinent Imaging:  Results for orders placed during the  hospital encounter of 06/11/19  DG Abd 1 View  Narrative CLINICAL DATA:  Partial intestinal obstruction.  EXAM: ABDOMEN - 1 VIEW  COMPARISON:  None.  FINDINGS: Bowel gas pattern is normal without evidence of ileus or obstruction on this single view. No abnormal calcifications or acute bone findings.  IMPRESSION: Unremarkable one-view abdominal radiograph.   Electronically Signed By: Loraine LericheMark  Shogry M.D. On: 06/11/2019 15:39  No results found for this or any previous visit.  No results found for this or any previous visit.  No results found for this or any previous visit.  No results found for this or any previous visit.  No results found for this or any previous visit.  No results found for this or any previous visit.  No results found for this or any previous visit.   Assessment & Plan:    1. Urinary incontinence, unspecified type We discussed intravesical botox, PTNS, and interstim. She will have UDS prior to proceeding with therapy.  - Urinalysis, Routine w reflex microscopic - BLADDER SCAN AMB NON-IMAGING   No follow-ups on file.  Wilkie Aye, MD  [facility] [place]

## 2020-12-07 NOTE — Telephone Encounter (Signed)
Pt called stating her [facility] pharmacy in [place] didn't receive her robaxin rx and she would like to have that resent. Pt would like a CB when this has been done please.   [phone]

## 2020-12-08 ENCOUNTER — Ambulatory Visit (HOSPITAL_COMMUNITY): Payer: Medicare Other | Attending: Physician Assistant | Admitting: Physical Therapy

## 2020-12-08 ENCOUNTER — Encounter (HOSPITAL_COMMUNITY): Payer: Self-pay | Admitting: Physical Therapy

## 2020-12-08 DIAGNOSIS — M25551 Pain in right hip: Secondary | ICD-10-CM | POA: Insufficient documentation

## 2020-12-08 DIAGNOSIS — R262 Difficulty in walking, not elsewhere classified: Secondary | ICD-10-CM | POA: Insufficient documentation

## 2020-12-08 DIAGNOSIS — M5416 Radiculopathy, lumbar region: Secondary | ICD-10-CM | POA: Insufficient documentation

## 2020-12-08 NOTE — Therapy (Signed)
[facility] [address] [place], [place], [address] Phone: [phone]   Fax:  [phone]  Physical Therapy Evaluation  Patient Details  Name: [name] MRN: [id number] Date of Birth: March 09, 1966 Referring Provider (PT): Richardean Canal   Encounter Date: 12/08/2020   PT End of Session - 12/08/20 1402     Visit Number 1    Number of Visits 57    Date for PT Re-Evaluation 57/01/22    Authorization Type BCBS medicare    Progress Note Due on Visit 10    PT Start Time 57 15    PT Stop Time 1400    PT Time Calculation (min) 45 min    Activity Tolerance Patient tolerated treatment well    Behavior During Therapy [facility] for tasks assessed/performed             Past Medical History:  Diagnosis Date   Anemia    Anxiety    Arthritis    Asthma    Cervical radiculopathy    Cluster headaches    COPD (chronic obstructive pulmonary disease) (HCC)    COPD (chronic obstructive pulmonary disease) with chronic bronchitis (HCC)    GERD (gastroesophageal reflux disease)    Hyperlipemia    Hypoglycemia    Hypothyroidism    Migraine    Pneumothorax    Pre-diabetes    Rectal discharge 07/28/2010   Shortness of breath    Sleep apnea    cannot tolerate-not using CPAP   Sluggishness 07/28/2010    Past Surgical History:  Procedure Laterality Date   ABDOMINAL HYSTERECTOMY     with bladder suspension   CERVICAL FUSION     CHOLECYSTECTOMY     COLONOSCOPY N/A 07/30/2013   Procedure: COLONOSCOPY;  Surgeon: Malissa Hippo, MD;  [facility];  Service: Endoscopy;  Laterality: N/A;  930   EXAM UNDER ANESTHESIA WITH MANIPULATION OF SHOULDER Right 10/11/2016   Procedure: EXAM UNDER ANESTHESIA WITH MANIPULATION OF SHOULDER;  Surgeon: Vickki Hearing, MD;  Location: [facility];  Service: Orthopedics;  Laterality: Right;   HERNIA REPAIR     INCISIONAL HERNIA REPAIR N/A 07/30/2016   Procedure: HERNIA REPAIR INCISIONAL WITH MESH;   Surgeon: [name], MD;  Location: [facility];  Service: General;  Laterality: N/A;   LOBECTOMY     right lung    POSTERIOR CERVICAL LAMINECTOMY Right 04/05/2017   Procedure: Right C6-7 Posterior cervical laminectomy;  Surgeon: Donalee Citrin, MD;  Location: [facility] OR;  Service: Neurosurgery;  Laterality: Right;  Right C6-7 Posterior cervical laminectomy   SHOULDER ARTHROSCOPY WITH ROTATOR CUFF REPAIR Right 10/11/2016   Procedure: SHOULDER ARTHROSCOPY;  Surgeon: Vickki Hearing, MD;  Location: [facility];  Service: Orthopedics;  Laterality: Right;   TOTAL HIP ARTHROPLASTY Right 04/15/2020   Procedure: RIGHT TOTAL HIP ARTHROPLASTY ANTERIOR APPROACH;  Surgeon: Kathryne Hitch, MD;  [facility];  Service: Orthopedics;  Laterality: Right;   TOTAL HIP ARTHROPLASTY Left 07/22/2020   Procedure: LEFT  HIP ARTHROPLASTY ANTERIOR APPROACH;  Surgeon: Kathryne Hitch, MD;  Location: [facility];  Service: Orthopedics;  Laterality: Left;  RNFA   TUBAL LIGATION      There were no vitals filed for this visit.    Subjective Assessment - 12/08/20 1403     Subjective Ms. Petrakis states that she has had avascular necrosis in both hips requiring total hip replacements.  The first was completed in January and then in April.  She recieved HH therapy and  did well, was ambulating without an assitive device in her home.  She began having pain going down her RT leg in early August.  The pain got so bad that she has returned to using the cane.  Her MD has referred her to skilled PT>    Pertinent History B THR,  COPD with O2 dependency, migranes and cervical surgery    Limitations Sitting;Lifting;Standing;Walking;House hold activities    How long can you sit comfortably? 15    How long can you stand comfortably? less than five    How long can you walk comfortably? less than 5    Currently in Pain? Yes    Pain Score 8     Pain Location Hip    Pain Orientation Right    Pain Descriptors / Indicators Aching;Tightness     Pain Type Acute pain    Pain Radiating Towards to knee    Pain Onset 1 to 4 weeks ago    Pain Frequency Constant    Aggravating Factors  WB    Pain Relieving Factors meds    Effect of Pain on Daily Activities limits                [facility] PT Assessment - 12/08/20 0001       Assessment   Medical Diagnosis Lumbar radiculopathy    Referring Provider (PT) Richardean CanalGilbert Clark    Onset Date/Surgical Date 11/13/20    Next MD Visit not scheduled    Prior Therapy none      Precautions   Precautions Anterior Hip   B     Restrictions   Weight Bearing Restrictions No      Balance Screen   Has the patient fallen in the past 6 months Yes    How many times? 4    Has the patient had a decrease in activity level because of a fear of falling?  Yes    Is the patient reluctant to leave their home because of a fear of falling?  No      Home [employer] information [employer] managernvironment   Living Environment Private residence      Prior Function   Level of Independence Independent    Vocation On disability    Leisure Walk      Cognition   Overall Cognitive Status Within Functional Limits for tasks assessed      Observation/Other Assessments   Focus on Therapeutic Outcomes (FOTO)  31/69% affected      Functional Tests   Functional tests Sit to Stand;Single leg stance      Single Leg Stance   Comments Rt 0;Lt 5"      Sit to Stand   Comments unable to rise from chair without UE assist      ROM / Strength   AROM / PROM / Strength AROM;Strength      AROM   AROM Assessment Site Lumbar      Strength   Strength Assessment Site Hip;Knee;Ankle    Right/Left Hip Right;Left    Right Hip Flexion 2+/5    Right Hip Extension 2/5    Right Hip ABduction 3+/5    Left Hip Flexion 3+/5    Left Hip Extension 3-/5    Right/Left Knee Right;Left    Right Knee Flexion 4/5    Right Knee Extension 3-/5    Left Knee Flexion 4+/5    Left Knee Extension 5/5    Right/Left Ankle Left;Right    Right Ankle Dorsiflexion 3+/5     Left  Ankle Dorsiflexion 4/5      Ambulation/Gait   Ambulation Distance (Feet) 113 Feet    Assistive device Straight cane   and O 2   Gait Pattern Decreased arm swing - right;Decreased step length - right;Decreased dorsiflexion - right;Decreased dorsiflexion - left    Gait Comments 2 MWT                        Objective measurements completed on examination: See above findings.       [facility] Adult PT Treatment/Exercise - 12/08/20 0001       Exercises   Exercises Lumbar;Knee/Hip      Knee/Hip Exercises: Seated   Long Arc Quad Both;5 reps    Long Arc Quad Limitations with red tband    Other Seated Knee/Hip Exercises resisted tband exercises with red tband B x 10    Abduction/Adduction  Both;10 reps    Abd/Adduction Limitations red tband/ adduction isometrically                    PT Education - 12/08/20 1401     Education Details HEP    Person(s) Educated Patient    Methods Explanation;Handout    Comprehension Verbalized understanding;Returned demonstration              PT Short Term Goals - 12/08/20 1414       PT SHORT TERM GOAL #1   Title Pt to be completing HEP to decrease radicular sx to buttock only    Time 2    Period Weeks    Status New    Target Date 12/22/20      PT SHORT TERM GOAL #2   Title Pt core and LE strength to be improved to allow pt to be able to come sit to stand from a firm, higher chair without UE assist    Time 2    Period Weeks    Status New               PT Long Term Goals - 12/08/20 1416       PT LONG TERM GOAL #1   Title PT to have an advance HEP to be able to erradicate radicular sx while walking.    Time 4    Period Weeks    Status New      PT LONG TERM GOAL #2   Title PT core and LE strength to be increased one grade to allow pt to be able to come sit to stand from a couch without UE assist    Time 4    Period Weeks    Status New    Target Date 01/05/21      PT LONG TERM GOAL #3   Title  PT to be able to single leg stance on both LE for at least 10 seconds to decrease risk of falling    Time 4    Period Weeks    Status New      PT LONG TERM GOAL #4   Title PT Rt hip and leg pain to be decreased to no greater than a 3/10 to allow pt to be able to walk 226 ft in a 2 minutes using her cane and O2    Time 4    Period Weeks    Status New                    Plan - 12/08/20 1408  Clinical Impression Statement Ms. Erlandson is a 55 yo female who has had recent B THR, the RT in January and the LT in April.  She did well until August when she began to have increased back and Rt leg pain.  She is now being referred to skilled PT.  Evaluation demonstrates decreased activity tolerance, antalgic gait, decreased strength, decreased balance and decreased ROM.  Ms. Verma will benefit from skilled PT to address these issues and maximize her functioning status.    Personal Factors and Comorbidities Comorbidity 3+;Fitness    Comorbidities B THR, COPD< cervical surgery, anxiety, depression    Examination-Activity Limitations Bed Mobility;Caring for Others;Carry;Dressing;Lift;Locomotion Level;Stand;Stairs;Squat;Sit    Examination-Participation Restrictions Cleaning;Shop    Stability/Clinical Decision Making Evolving/Moderate complexity    Clinical Decision Making Moderate    Rehab Potential Fair    PT Frequency 3x / week    PT Duration 4 weeks    PT Treatment/Interventions Gait training;Stair training;Therapeutic exercise;Patient/family education;Manual techniques    PT Next Visit Plan begin improving gait mechanics, spinal mobility and stabilization.    PT Home Exercise Plan t band resisted dorsiflexion B, knee extension B, hip abduction B, hip adduction B             Patient will benefit from skilled therapeutic intervention in order to improve the following deficits and impairments:  Abnormal gait, Cardiopulmonary status limiting activity, Decreased activity tolerance,  Decreased balance, Decreased mobility, Decreased range of motion, Decreased strength, Difficulty walking, Pain  Visit Diagnosis: Difficulty in walking, not elsewhere classified - Plan: PT plan of care cert/re-cert  Pain in right hip - Plan: PT plan of care cert/re-cert     Problem List Patient Active Problem List   Diagnosis Date Noted   Radiculopathy, lumbar region 12/08/2020   Status post left hip replacement 07/22/2020   Status post right hip replacement 05/02/2020   Status post total replacement of right hip 04/15/2020   Avascular necrosis of bone of left hip (HCC) 02/10/2020   Avascular necrosis of bone of right hip (HCC) 02/10/2020   Allergic rhinitis 05/21/2019   Chronic asthmatic bronchitis (HCC) 04/30/2019   Chronic respiratory failure with hypoxia (HCC) 04/30/2019   Spinal stenosis of cervical region 04/05/2017   Partial tear of right subscapularis tendon    Incisional hernia, without obstruction or gangrene    Urinary frequency 03/21/2012   Migraine equivalent syndrome 03/21/2012   Insomnia 03/21/2012  Virgina Organ, PT CLT [phone]  12/08/2020, 2:24 PM  [facility] [address] [place], [place], [address] Phone: [phone]   Fax:  [phone]  Name: TAIESHA BOVARD MRN: [id number] Date of Birth: 02/15/1966

## 2020-12-13 ENCOUNTER — Encounter: Payer: Self-pay | Admitting: Allergy & Immunology

## 2020-12-13 ENCOUNTER — Ambulatory Visit (HOSPITAL_COMMUNITY): Payer: Medicare Other

## 2020-12-13 ENCOUNTER — Encounter (HOSPITAL_COMMUNITY): Payer: Self-pay

## 2020-12-13 ENCOUNTER — Other Ambulatory Visit: Payer: Self-pay

## 2020-12-13 DIAGNOSIS — R262 Difficulty in walking, not elsewhere classified: Secondary | ICD-10-CM

## 2020-12-13 DIAGNOSIS — M25551 Pain in right hip: Secondary | ICD-10-CM

## 2020-12-13 NOTE — Therapy (Signed)
[facility] [address] [place], [place], [address] Phone: [phone]   Fax:  [phone]  Physical Therapy Treatment  Patient Details  Name: [name] MRN: [id number] Date of Birth: 09/01/1965 Referring Provider (PT): Richardean CanalGilbert Clark   Encounter Date: 12/13/2020   PT End of Session - 12/13/20 1142     Visit Number 2    Number of Visits 12    Date for PT Re-Evaluation 01/07/21    Authorization Type BCBS medicare    Progress Note Due on Visit 10    PT Start Time 1136    PT Stop Time 1215    PT Time Calculation (min) 39 min    Equipment Utilized During Treatment --   SPC and O2   Activity Tolerance Patient tolerated treatment well    Behavior During Therapy [facility] for tasks assessed/performed             Past Medical History:  Diagnosis Date   Anemia    Anxiety    Arthritis    Asthma    Cervical radiculopathy    Cluster headaches    COPD (chronic obstructive pulmonary disease) (HCC)    COPD (chronic obstructive pulmonary disease) with chronic bronchitis (HCC)    GERD (gastroesophageal reflux disease)    Hyperlipemia    Hypoglycemia    Hypothyroidism    Migraine    Pneumothorax    Pre-diabetes    Rectal discharge 07/28/2010   Shortness of breath    Sleep apnea    cannot tolerate-not using CPAP   Sluggishness 07/28/2010    Past Surgical History:  Procedure Laterality Date   ABDOMINAL HYSTERECTOMY     with bladder suspension   CERVICAL FUSION     CHOLECYSTECTOMY     COLONOSCOPY N/A 07/30/2013   Procedure: COLONOSCOPY;  Surgeon: Malissa HippoNajeeb U Rehman, MD;  [facility];  Service: Endoscopy;  Laterality: N/A;  930   EXAM UNDER ANESTHESIA WITH MANIPULATION OF SHOULDER Right 10/11/2016   Procedure: EXAM UNDER ANESTHESIA WITH MANIPULATION OF SHOULDER;  Surgeon: Vickki HearingHarrison, Stanley E, MD;  Location: [facility];  Service: Orthopedics;  Laterality: Right;   HERNIA REPAIR     INCISIONAL HERNIA REPAIR N/A 07/30/2016    Procedure: HERNIA REPAIR INCISIONAL WITH MESH;  Surgeon: Franky MachoMark Jenkins, MD;  Location: [facility];  Service: General;  Laterality: N/A;   LOBECTOMY     right lung    POSTERIOR CERVICAL LAMINECTOMY Right 04/05/2017   Procedure: Right C6-7 Posterior cervical laminectomy;  Surgeon: Donalee Citrinram, Gary, MD;  [facility];  Service: Neurosurgery;  Laterality: Right;  Right C6-7 Posterior cervical laminectomy   SHOULDER ARTHROSCOPY WITH ROTATOR CUFF REPAIR Right 10/11/2016   Procedure: SHOULDER ARTHROSCOPY;  Surgeon: Vickki HearingHarrison, Stanley E, MD;  Location: [facility];  Service: Orthopedics;  Laterality: Right;   TOTAL HIP ARTHROPLASTY Right 04/15/2020   Procedure: RIGHT TOTAL HIP ARTHROPLASTY ANTERIOR APPROACH;  Surgeon: Kathryne HitchBlackman, Christopher Y, MD;  Location: [facility];  Service: Orthopedics;  Laterality: Right;   TOTAL HIP ARTHROPLASTY Left 07/22/2020   Procedure: LEFT  HIP ARTHROPLASTY ANTERIOR APPROACH;  Surgeon: Kathryne HitchBlackman, Christopher Y, MD;  Location: [facility];  Service: Orthopedics;  Laterality: Left;  RNFA   TUBAL LIGATION      There were no vitals filed for this visit.   Subjective Assessment - 12/13/20 1140     Subjective Pt reports some pain across lower back into Rt hip ending around lateral knee, pain scale 5/10 constant achey pain.  Has  began the HEP without questions daily.    Pertinent History B THR,  COPD with O2 dependency, migranes and cervical surgery    Currently in Pain? Yes    Pain Score 5     Pain Location Back    Pain Orientation Lower;Right    Pain Descriptors / Indicators Aching    Pain Type Acute pain    Pain Radiating Towards to knee    Pain Onset 1 to 4 weeks ago    Pain Frequency Constant    Aggravating Factors  WB    Pain Relieving Factors meds    Effect of Pain on Daily Activities limits                               [facility] Adult PT Treatment/Exercise - 12/13/20 0001       Ambulation/Gait   Ambulation Distance (Feet) 60 Feet    Assistive device Straight cane     Gait Pattern Decreased arm swing - right;Decreased step length - right;Decreased dorsiflexion - right;Decreased dorsiflexion - left    Pre-Gait Activities Cueing for posture, heel to toe, 3 point sequence to increase stride length      Lumbar Exercises: Seated   Long Arc Quad on Chair Both;10 reps    LAQ on Chair Limitations RTB resistance    Sit to Stand 5 reps    Sit to Stand Limitations no HHA from 22in height      Lumbar Exercises: Supine   Bent Knee Raise 10 reps;3 seconds    Bent Knee Raise Limitations with ab set    Bridge 10 reps;3 seconds    Straight Leg Raise 5 reps    Straight Leg Raises Limitations 2 sets; cueing for quad set prior raise for extension lag Rt LE                    PT Education - 12/13/20 1149     Education Details Reviewed goals, educated importance of HEP compliance for maximal benefits, pt able to recall and demonstrate current exercise program    Person(s) Educated Patient    Methods Explanation    Comprehension Verbalized understanding;Returned demonstration              PT Short Term Goals - 12/08/20 1414       PT SHORT TERM GOAL #1   Title Pt to be completing HEP to decrease radicular sx to buttock only    Time 2    Period Weeks    Status New    Target Date 12/22/20      PT SHORT TERM GOAL #2   Title Pt core and LE strength to be improved to allow pt to be able to come sit to stand from a firm, higher chair without UE assist    Time 2    Period Weeks    Status New               PT Long Term Goals - 12/08/20 1416       PT LONG TERM GOAL #1   Title PT to have an advance HEP to be able to erradicate radicular sx while walking.    Time 4    Period Weeks    Status New      PT LONG TERM GOAL #2   Title PT core and LE strength to be increased one grade to allow pt to be able to come  sit to stand from a couch without UE assist    Time 4    Period Weeks    Status New    Target Date 01/05/21      PT LONG TERM  GOAL #3   Title PT to be able to single leg stance on both LE for at least 10 seconds to decrease risk of falling    Time 4    Period Weeks    Status New      PT LONG TERM GOAL #4   Title PT Rt hip and leg pain to be decreased to no greater than a 3/10 to allow pt to be able to walk 226 ft in a 2 minutes using her cane and O2    Time 4    Period Weeks    Status New                   Plan - 12/13/20 1153     Clinical Impression Statement Reviewed goals, educated importance of HEP compliance for maximal benefits, pt able to recall and demonstrate appropriate mechanics wiht current exercise program.  Session focus with core and proximal strengthening and gait trianing to improve mechanics.  Cueing for sequence iwth SPC, heel to toe mechanics, posture and encouraged to increase stride length to normalize gait.  Added hip strengthening exercises to HEP with printout given.    Personal Factors and Comorbidities Comorbidity 3+;Fitness    Comorbidities B THR, COPD< cervical surgery, anxiety, depression    Examination-Activity Limitations Bed Mobility;Caring for Others;Carry;Dressing;Lift;Locomotion Level;Stand;Stairs;Squat;Sit    Examination-Participation Restrictions Cleaning;Shop    Stability/Clinical Decision Making Evolving/Moderate complexity    Clinical Decision Making Moderate    Rehab Potential Fair    PT Frequency 3x / week    PT Duration 4 weeks    PT Treatment/Interventions Gait training;Stair training;Therapeutic exercise;Patient/family education;Manual techniques    PT Next Visit Plan begin improving gait mechanics, spinal mobility and stabilization.    PT Home Exercise Plan t band resisted dorsiflexion B, knee extension B, hip abduction B, hip adduction B; 9/6: bridge and SLR    Consulted and Agree with Plan of Care Patient             Patient will benefit from skilled therapeutic intervention in order to improve the following deficits and impairments:  Abnormal  gait, Cardiopulmonary status limiting activity, Decreased activity tolerance, Decreased balance, Decreased mobility, Decreased range of motion, Decreased strength, Difficulty walking, Pain  Visit Diagnosis: Pain in right hip  Difficulty in walking, not elsewhere classified     Problem List Patient Active Problem List   Diagnosis Date Noted   Radiculopathy, lumbar region 12/08/2020   Status post left hip replacement 07/22/2020   Status post right hip replacement 05/02/2020   Status post total replacement of right hip 04/15/2020   Avascular necrosis of bone of left hip (HCC) 02/10/2020   Avascular necrosis of bone of right hip (HCC) 02/10/2020   Allergic rhinitis 05/21/2019   Chronic asthmatic bronchitis (HCC) 04/30/2019   Chronic respiratory failure with hypoxia (HCC) 04/30/2019   Spinal stenosis of cervical region 04/05/2017   Partial tear of right subscapularis tendon    Incisional hernia, without obstruction or gangrene    Urinary frequency 03/21/2012   Migraine equivalent syndrome 03/21/2012   Insomnia 03/21/2012   Becky Sax, LPTA/CLT; CBIS [phone]  Juel Burrow 12/13/2020, 1:10 PM  [place] [facility] [address] [place] [place], [place], [address] Phone: [phone]  Fax:  [phone]  Name: [name] MRN: [id number] Date of Birth: [birth year]

## 2020-12-13 NOTE — Patient Instructions (Addendum)
Bridge    Lie back, legs bent. Inhale, pressing hips up. Keeping ribs in, lengthen lower back. Exhale, rolling down along spine from top. Repeat 10 times. Do 2 sessions per day.  http://pm.exer.us/55   Copyright  VHI. All rights reserved.   Straight Leg Raise    Tighten stomach and slowly raise locked right leg ____ inches from floor. Repeat 10 times per set. Do 2 sets per session. Do ____ sessions per day.  http://orth.exer.us/1103   Copyright  VHI. All rights reserved.

## 2020-12-15 ENCOUNTER — Ambulatory Visit (HOSPITAL_COMMUNITY): Payer: Medicare Other

## 2020-12-15 ENCOUNTER — Encounter (HOSPITAL_COMMUNITY): Payer: Self-pay

## 2020-12-15 ENCOUNTER — Other Ambulatory Visit: Payer: Self-pay

## 2020-12-15 DIAGNOSIS — R262 Difficulty in walking, not elsewhere classified: Secondary | ICD-10-CM | POA: Diagnosis not present

## 2020-12-15 DIAGNOSIS — M25551 Pain in right hip: Secondary | ICD-10-CM

## 2020-12-15 NOTE — Therapy (Signed)
[facility] [address] [place], [place], [address] Phone: [phone]   Fax:  [phone]  Physical Therapy Treatment  Patient Details  Name: ATHZIRI FREUNDLICH MRN: [id number] Date of Birth: July 02, 1965 Referring Provider (PT): Richardean Canal   Encounter Date: 12/15/2020   PT End of Session - 12/15/20 1310     Visit Number 3    Number of Visits 12    Date for PT Re-Evaluation 01/07/21    Authorization Type BCBS medicare    Progress Note Due on Visit 10    PT Start Time 1302    PT Stop Time 1345    PT Time Calculation (min) 43 min    Activity Tolerance Patient tolerated treatment well    Behavior During Therapy [facility] for tasks assessed/performed             Past Medical History:  Diagnosis Date   Anemia    Anxiety    Arthritis    Asthma    Cervical radiculopathy    Cluster headaches    COPD (chronic obstructive pulmonary disease) (HCC)    COPD (chronic obstructive pulmonary disease) with chronic bronchitis (HCC)    GERD (gastroesophageal reflux disease)    Hyperlipemia    Hypoglycemia    Hypothyroidism    Migraine    Pneumothorax    Pre-diabetes    Rectal discharge 07/28/2010   Shortness of breath    Sleep apnea    cannot tolerate-not using CPAP   Sluggishness 07/28/2010    Past Surgical History:  Procedure Laterality Date   ABDOMINAL HYSTERECTOMY     with bladder suspension   CERVICAL FUSION     CHOLECYSTECTOMY     COLONOSCOPY N/A 07/30/2013   Procedure: COLONOSCOPY;  Surgeon: Malissa Hippo, MD;  [facility];  Service: Endoscopy;  Laterality: N/A;  930   EXAM UNDER ANESTHESIA WITH MANIPULATION OF SHOULDER Right 10/11/2016   Procedure: EXAM UNDER ANESTHESIA WITH MANIPULATION OF SHOULDER;  Surgeon: Vickki Hearing, MD;  Location: [facility];  Service: Orthopedics;  Laterality: Right;   HERNIA REPAIR     INCISIONAL HERNIA REPAIR N/A 07/30/2016   Procedure: HERNIA REPAIR INCISIONAL WITH MESH;   Surgeon: [name], MD;  Location: [facility];  Service: General;  Laterality: N/A;   LOBECTOMY     right lung    POSTERIOR CERVICAL LAMINECTOMY Right 04/05/2017   Procedure: Right C6-7 Posterior cervical laminectomy;  Surgeon: Donalee Citrin, MD;  Location: [facility] OR;  Service: Neurosurgery;  Laterality: Right;  Right C6-7 Posterior cervical laminectomy   SHOULDER ARTHROSCOPY WITH ROTATOR CUFF REPAIR Right 10/11/2016   Procedure: SHOULDER ARTHROSCOPY;  Surgeon: Vickki Hearing, MD;  Location: [facility];  Service: Orthopedics;  Laterality: Right;   TOTAL HIP ARTHROPLASTY Right 04/15/2020   Procedure: RIGHT TOTAL HIP ARTHROPLASTY ANTERIOR APPROACH;  Surgeon: Kathryne Hitch, MD;  Location: [facility];  Service: Orthopedics;  Laterality: Right;   TOTAL HIP ARTHROPLASTY Left 07/22/2020   Procedure: LEFT  HIP ARTHROPLASTY ANTERIOR APPROACH;  Surgeon: Kathryne Hitch, MD;  Location: [facility];  Service: Orthopedics;  Laterality: Left;  RNFA   TUBAL LIGATION      There were no vitals filed for this visit.   Subjective Assessment - 12/15/20 1308     Subjective Pt reports she bent over to feed her small dog, increased pain and husband had to help her stand up.  Current pain scale 4-5/10 achey pain.    Pertinent History B THR,  COPD with O2 dependency, migranes and cervical surgery    Currently in Pain? Yes    Pain Score 5     Pain Location Back    Pain Orientation Lower;Right    Pain Descriptors / Indicators Aching    Pain Type Acute pain    Pain Onset 1 to 4 weeks ago    Pain Frequency Constant    Aggravating Factors  WB    Pain Relieving Factors meds    Effect of Pain on Daily Activities limits                               [facility] Adult PT Treatment/Exercise - 12/15/20 0001       Ambulation/Gait   Ambulation Distance (Feet) 85 Feet    Assistive device Straight cane    Pre-Gait Activities Improved sequence and appropriate arm swing following cueing.  Tendency to  lateral flex trunk- educated importance of standing tall with gait for core engangement      Lumbar Exercises: Standing   Other Standing Lumbar Exercises sidestep down long hallway 1RT with HHA against railing      Lumbar Exercises: Seated   Sit to Stand 5 reps    Sit to Stand Limitations use of SPC to assist elevated to 22.5in    Other Seated Lumbar Exercises w back, ab set      Lumbar Exercises: Supine   Bridge 10 reps;3 seconds    Straight Leg Raise 5 reps    Straight Leg Raises Limitations 2 sets; improved control, ability to flex Rt hip 45 degrees, Lt WNL      Lumbar Exercises: Sidelying   Clam 10 reps    Hip Abduction 10 reps                       PT Short Term Goals - 12/08/20 1414       PT SHORT TERM GOAL #1   Title Pt to be completing HEP to decrease radicular sx to buttock only    Time 2    Period Weeks    Status New    Target Date 12/22/20      PT SHORT TERM GOAL #2   Title Pt core and LE strength to be improved to allow pt to be able to come sit to stand from a firm, higher chair without UE assist    Time 2    Period Weeks    Status New               PT Long Term Goals - 12/08/20 1416       PT LONG TERM GOAL #1   Title PT to have an advance HEP to be able to erradicate radicular sx while walking.    Time 4    Period Weeks    Status New      PT LONG TERM GOAL #2   Title PT core and LE strength to be increased one grade to allow pt to be able to come sit to stand from a couch without UE assist    Time 4    Period Weeks    Status New    Target Date 01/05/21      PT LONG TERM GOAL #3   Title PT to be able to single leg stance on both LE for at least 10 seconds to decrease risk of falling    Time 4  Period Weeks    Status New      PT LONG TERM GOAL #4   Title PT Rt hip and leg pain to be decreased to no greater than a 3/10 to allow pt to be able to walk 226 ft in a 2 minutes using her cane and O2    Time 4    Period Weeks     Status New                   Plan - 12/15/20 1446     Clinical Impression Statement Added hip strenghtneing exercises to POC.  Pt able to complete all exercises with good form.  Reports difficulty laying on Rt side though was able to tolerate clams and abduction with no reports of increased pain.  Increased difficulty with STS this session, elevated height to assist.  Pt presents wiht improved mechanics with SPC gait mechanics, no LOB.  EOS pt limited by fatigue.    Personal Factors and Comorbidities Comorbidity 3+;Fitness    Comorbidities B THR, COPD< cervical surgery, anxiety, depression    Examination-Activity Limitations Bed Mobility;Caring for Others;Carry;Dressing;Lift;Locomotion Level;Stand;Stairs;Squat;Sit    Examination-Participation Restrictions Cleaning;Shop    Stability/Clinical Decision Making Evolving/Moderate complexity    Clinical Decision Making Moderate    Rehab Potential Fair    PT Frequency 3x / week    PT Duration 4 weeks    PT Treatment/Interventions Gait training;Stair training;Therapeutic exercise;Patient/family education;Manual techniques    PT Next Visit Plan begin improving gait mechanics, spinal mobility and stabilization.  Add minisquats, standing hip strengthening and balance training next session.    PT Home Exercise Plan t band resisted dorsiflexion B, knee extension B, hip abduction B, hip adduction B; 9/6: bridge and SLR; 12/15/20: clam and abd    Consulted and Agree with Plan of Care Patient             Patient will benefit from skilled therapeutic intervention in order to improve the following deficits and impairments:  Abnormal gait, Cardiopulmonary status limiting activity, Decreased activity tolerance, Decreased balance, Decreased mobility, Decreased range of motion, Decreased strength, Difficulty walking, Pain  Visit Diagnosis: Pain in right hip  Difficulty in walking, not elsewhere classified     Problem List Patient Active Problem  List   Diagnosis Date Noted   Radiculopathy, lumbar region 12/08/2020   Status post left hip replacement 07/22/2020   Status post right hip replacement 05/02/2020   Status post total replacement of right hip 04/15/2020   Avascular necrosis of bone of left hip (HCC) 02/10/2020   Avascular necrosis of bone of right hip (HCC) 02/10/2020   Allergic rhinitis 05/21/2019   Chronic asthmatic bronchitis (HCC) 04/30/2019   Chronic respiratory failure with hypoxia (HCC) 04/30/2019   Spinal stenosis of cervical region 04/05/2017   Partial tear of right subscapularis tendon    Incisional hernia, without obstruction or gangrene    Urinary frequency 03/21/2012   Migraine equivalent syndrome 03/21/2012   Insomnia 03/21/2012   Becky Sax, LPTA/CLT; CBIS [phone]  Juel Burrow, PTA 12/15/2020, 2:54 PM   [facility] [address] [place], [place], [address] Phone: [phone]   Fax:  [phone]  Name: NYX KEADY MRN: [id number] Date of Birth: 05/24/1965

## 2020-12-15 NOTE — Patient Instructions (Signed)
Straight Leg Raise    Tighten stomach and slowly raise locked right leg ____ inches from floor. 10 reps per set, 2 sets per day, 4 days per week.  http://orth.exer.us/1103   Copyright  VHI. All rights reserved.   Abduction: Clam (Eccentric) - Side-Lying    Lie on side with knees bent. Lift top knee, keeping feet together. Keep trunk steady. Slowly lower for 3-5 seconds.  10 reps per set, 2 sets per day, 4 days per week.  http://ecce.exer.us/65   Copyright  VHI. All rights reserved.   Abduction: Side Leg Lift (Eccentric) - Side-Lying    Lie on side. Lift top leg slightly higher than shoulder level. Keep top leg straight with body, toes pointing forward. Slowly lower for 3-5 seconds.  10 reps per set, 2 sets per day, 4 days per week.   http://ecce.exer.us/63   Copyright  VHI. All rights reserved.

## 2020-12-18 ENCOUNTER — Other Ambulatory Visit: Payer: Self-pay | Admitting: Internal Medicine

## 2020-12-19 ENCOUNTER — Ambulatory Visit (HOSPITAL_COMMUNITY): Payer: Medicare Other

## 2020-12-19 ENCOUNTER — Other Ambulatory Visit: Payer: Self-pay

## 2020-12-19 DIAGNOSIS — M25551 Pain in right hip: Secondary | ICD-10-CM

## 2020-12-19 DIAGNOSIS — R262 Difficulty in walking, not elsewhere classified: Secondary | ICD-10-CM | POA: Diagnosis not present

## 2020-12-19 NOTE — Therapy (Signed)
[facility] [address] [place], [place], [address] Phone: [phone]   Fax:  [phone]  Physical Therapy Treatment  Patient Details  Name: [name] MRN: [id number] Date of Birth: 01/21/1966 Referring Provider (PT): Richardean Canal   Encounter Date: 12/19/2020   PT End of Session - 12/19/20 1121     Visit Number 4    Number of Visits 12    Date for PT Re-Evaluation 01/07/21    Authorization Type BCBS medicare    Progress Note Due on Visit 10    PT Start Time 1119    PT Stop Time 1204    PT Time Calculation (min) 45 min    Activity Tolerance Patient tolerated treatment well    Behavior During Therapy [facility] for tasks assessed/performed             Past Medical History:  Diagnosis Date   Anemia    Anxiety    Arthritis    Asthma    Cervical radiculopathy    Cluster headaches    COPD (chronic obstructive pulmonary disease) (HCC)    COPD (chronic obstructive pulmonary disease) with chronic bronchitis (HCC)    GERD (gastroesophageal reflux disease)    Hyperlipemia    Hypoglycemia    Hypothyroidism    Migraine    Pneumothorax    Pre-diabetes    Rectal discharge 07/28/2010   Shortness of breath    Sleep apnea    cannot tolerate-not using CPAP   Sluggishness 07/28/2010    Past Surgical History:  Procedure Laterality Date   ABDOMINAL HYSTERECTOMY     with bladder suspension   CERVICAL FUSION     CHOLECYSTECTOMY     COLONOSCOPY N/A 07/30/2013   Procedure: COLONOSCOPY;  Surgeon: Malissa Hippo, MD;  [facility];  Service: Endoscopy;  Laterality: N/A;  930   EXAM UNDER ANESTHESIA WITH MANIPULATION OF SHOULDER Right 10/11/2016   Procedure: EXAM UNDER ANESTHESIA WITH MANIPULATION OF SHOULDER;  Surgeon: Vickki Hearing, MD;  Location: [facility];  Service: Orthopedics;  Laterality: Right;   HERNIA REPAIR     INCISIONAL HERNIA REPAIR N/A 07/30/2016   Procedure: HERNIA REPAIR INCISIONAL WITH MESH;   Surgeon: [name], MD;  Location: [facility];  Service: General;  Laterality: N/A;   LOBECTOMY     right lung    POSTERIOR CERVICAL LAMINECTOMY Right 04/05/2017   Procedure: Right C6-7 Posterior cervical laminectomy;  Surgeon: Donalee Citrin, MD;  Location: [facility] OR;  Service: Neurosurgery;  Laterality: Right;  Right C6-7 Posterior cervical laminectomy   SHOULDER ARTHROSCOPY WITH ROTATOR CUFF REPAIR Right 10/11/2016   Procedure: SHOULDER ARTHROSCOPY;  Surgeon: Vickki Hearing, MD;  Location: [facility];  Service: Orthopedics;  Laterality: Right;   TOTAL HIP ARTHROPLASTY Right 04/15/2020   Procedure: RIGHT TOTAL HIP ARTHROPLASTY ANTERIOR APPROACH;  Surgeon: Kathryne Hitch, MD;  Location: [facility];  Service: Orthopedics;  Laterality: Right;   TOTAL HIP ARTHROPLASTY Left 07/22/2020   Procedure: LEFT  HIP ARTHROPLASTY ANTERIOR APPROACH;  Surgeon: Kathryne Hitch, MD;  Location: [facility];  Service: Orthopedics;  Laterality: Left;  RNFA   TUBAL LIGATION      There were no vitals filed for this visit.   Subjective Assessment - 12/19/20 1127     Subjective Continued radiating RLE pain from buttocks along lateral thigh/knee    Pertinent History B THR,  COPD with O2 dependency, migranes and cervical surgery    Currently in Pain? Yes  Pain Score 5     Pain Location Buttocks    Pain Orientation Right    Pain Descriptors / Indicators Aching    Pain Onset 1 to 4 weeks ago                [facility] PT Assessment - 12/19/20 0001       Assessment   Medical Diagnosis Lumbar radiculopathy                           [facility] Adult PT Treatment/Exercise - 12/19/20 0001       Lumbar Exercises: Supine   Bridge 20 reps    Bridge Limitations 10x PF, 10x DF    Isometric Hip Flexion 10 reps;2 seconds      Lumbar Exercises: Sidelying   Clam Right;15 reps    Hip Abduction Right;15 reps    Hip Abduction Limitations with assistance      Knee/Hip Exercises: Standing   Knee Flexion  Strengthening;Right;1 set;10 reps    Hip Abduction Right;1 set;10 reps      Knee/Hip Exercises: Seated   Long Arc Quad Strengthening;Both;2 sets;10 reps    Other Seated Knee/Hip Exercises ankle pumps/rotation x  2 min for dynamic warm-up      Knee/Hip Exercises: Supine   Other Supine Knee/Hip Exercises manually resisted hip flexion on stability ball 2x10                     PT Education - 12/19/20 1141     Education Details education in lumbar stabilization and HEP additions. Educated on placeing O2 concentrator on right side and use of cane for LUE to decrease joint reaction force right hip    Person(s) Educated Patient    Methods Explanation;Handout    Comprehension Verbalized understanding              PT Short Term Goals - 12/08/20 1414       PT SHORT TERM GOAL #1   Title Pt to be completing HEP to decrease radicular sx to buttock only    Time 2    Period Weeks    Status New    Target Date 12/22/20      PT SHORT TERM GOAL #2   Title Pt core and LE strength to be improved to allow pt to be able to come sit to stand from a firm, higher chair without UE assist    Time 2    Period Weeks    Status New               PT Long Term Goals - 12/08/20 1416       PT LONG TERM GOAL #1   Title PT to have an advance HEP to be able to erradicate radicular sx while walking.    Time 4    Period Weeks    Status New      PT LONG TERM GOAL #2   Title PT core and LE strength to be increased one grade to allow pt to be able to come sit to stand from a couch without UE assist    Time 4    Period Weeks    Status New    Target Date 01/05/21      PT LONG TERM GOAL #3   Title PT to be able to single leg stance on both LE for at least 10 seconds to decrease risk of falling  Time 4    Period Weeks    Status New      PT LONG TERM GOAL #4   Title PT Rt hip and leg pain to be decreased to no greater than a 3/10 to allow pt to be able to walk 226 ft in a 2 minutes  using her cane and O2    Time 4    Period Weeks    Status New                   Plan - 12/19/20 1207     Clinical Impression Statement Continued to demo RLE weakness/guarding with decreased ability to weight shift in standing due to pain/weakness.  Tolerating increased isolation exercises to improve lumbar/pelvic strength to enable RLE wieght acceptance. Continued sessions to improve pelvic strength and stability to progress to ambulation without AD    Personal Factors and Comorbidities Comorbidity 3+;Fitness    Comorbidities B THR, COPD< cervical surgery, anxiety, depression    Examination-Activity Limitations Bed Mobility;Caring for Others;Carry;Dressing;Lift;Locomotion Level;Stand;Stairs;Squat;Sit    Examination-Participation Restrictions Cleaning;Shop    Stability/Clinical Decision Making Evolving/Moderate complexity    Rehab Potential Fair    PT Frequency 3x / week    PT Duration 4 weeks    PT Treatment/Interventions Gait training;Stair training;Therapeutic exercise;Patient/family education;Manual techniques    PT Next Visit Plan begin improving gait mechanics, spinal mobility and stabilization.  Add minisquats, standing hip strengthening and balance training next session.    PT Home Exercise Plan t band resisted dorsiflexion B, knee extension B, hip abduction B, hip adduction B; 9/6: bridge and SLR; 12/15/20: clam and abd    Consulted and Agree with Plan of Care Patient             Patient will benefit from skilled therapeutic intervention in order to improve the following deficits and impairments:  Abnormal gait, Cardiopulmonary status limiting activity, Decreased activity tolerance, Decreased balance, Decreased mobility, Decreased range of motion, Decreased strength, Difficulty walking, Pain  Visit Diagnosis: Pain in right hip  Difficulty in walking, not elsewhere classified     Problem List Patient Active Problem List   Diagnosis Date Noted   Radiculopathy,  lumbar region 12/08/2020   Status post left hip replacement 07/22/2020   Status post right hip replacement 05/02/2020   Status post total replacement of right hip 04/15/2020   Avascular necrosis of bone of left hip (HCC) 02/10/2020   Avascular necrosis of bone of right hip (HCC) 02/10/2020   Allergic rhinitis 05/21/2019   Chronic asthmatic bronchitis (HCC) 04/30/2019   Chronic respiratory failure with hypoxia (HCC) 04/30/2019   Spinal stenosis of cervical region 04/05/2017   Partial tear of right subscapularis tendon    Incisional hernia, without obstruction or gangrene    Urinary frequency 03/21/2012   Migraine equivalent syndrome 03/21/2012   Insomnia 03/21/2012  12:10 PM, 12/19/20 M. Shary Decamp, PT, DPT Physical Therapist- [place] Office Number: [phone]   [facility] [address] [place], [place], [address] Phone: [phone]   Fax:  [phone]  Name: [name] MRN: [id number] Date of Birth: Feb 24, 1966

## 2020-12-21 ENCOUNTER — Other Ambulatory Visit: Payer: Self-pay

## 2020-12-21 ENCOUNTER — Ambulatory Visit (HOSPITAL_COMMUNITY): Payer: Medicare Other

## 2020-12-21 DIAGNOSIS — R262 Difficulty in walking, not elsewhere classified: Secondary | ICD-10-CM | POA: Diagnosis not present

## 2020-12-21 DIAGNOSIS — M25551 Pain in right hip: Secondary | ICD-10-CM

## 2020-12-21 NOTE — Therapy (Signed)
[facility] [address] [place], [place], [address] Phone: [phone]   Fax:  [phone]  Physical Therapy Treatment  Patient Details  Name: [name] MRN: [id number] Date of Birth: November 10, 1965 Referring Provider (PT): Richardean Canal   Encounter Date: 12/21/2020   PT End of Session - 12/21/20 0903     Visit Number 5    Number of Visits 12    Date for PT Re-Evaluation 01/07/21    Authorization Type BCBS medicare    Progress Note Due on Visit 10    PT Start Time 0900    PT Stop Time 0945    PT Time Calculation (min) 45 min    Activity Tolerance Patient tolerated treatment well    Behavior During Therapy [facility] for tasks assessed/performed             Past Medical History:  Diagnosis Date   Anemia    Anxiety    Arthritis    Asthma    Cervical radiculopathy    Cluster headaches    COPD (chronic obstructive pulmonary disease) (HCC)    COPD (chronic obstructive pulmonary disease) with chronic bronchitis (HCC)    GERD (gastroesophageal reflux disease)    Hyperlipemia    Hypoglycemia    Hypothyroidism    Migraine    Pneumothorax    Pre-diabetes    Rectal discharge 07/28/2010   Shortness of breath    Sleep apnea    cannot tolerate-not using CPAP   Sluggishness 07/28/2010    Past Surgical History:  Procedure Laterality Date   ABDOMINAL HYSTERECTOMY     with bladder suspension   CERVICAL FUSION     CHOLECYSTECTOMY     COLONOSCOPY N/A 07/30/2013   Procedure: COLONOSCOPY;  Surgeon: Malissa Hippo, MD;  [facility];  Service: Endoscopy;  Laterality: N/A;  930   EXAM UNDER ANESTHESIA WITH MANIPULATION OF SHOULDER Right 10/11/2016   Procedure: EXAM UNDER ANESTHESIA WITH MANIPULATION OF SHOULDER;  Surgeon: Vickki Hearing, MD;  Location: [facility];  Service: Orthopedics;  Laterality: Right;   HERNIA REPAIR     INCISIONAL HERNIA REPAIR N/A 07/30/2016   Procedure: HERNIA REPAIR INCISIONAL WITH MESH;   Surgeon: [name], MD;  Location: [facility];  Service: General;  Laterality: N/A;   LOBECTOMY     right lung    POSTERIOR CERVICAL LAMINECTOMY Right 04/05/2017   Procedure: Right C6-7 Posterior cervical laminectomy;  Surgeon: Donalee Citrin, MD;  Location: [facility] OR;  Service: Neurosurgery;  Laterality: Right;  Right C6-7 Posterior cervical laminectomy   SHOULDER ARTHROSCOPY WITH ROTATOR CUFF REPAIR Right 10/11/2016   Procedure: SHOULDER ARTHROSCOPY;  Surgeon: Vickki Hearing, MD;  Location: [facility];  Service: Orthopedics;  Laterality: Right;   TOTAL HIP ARTHROPLASTY Right 04/15/2020   Procedure: RIGHT TOTAL HIP ARTHROPLASTY ANTERIOR APPROACH;  Surgeon: Kathryne Hitch, MD;  [facility];  Service: Orthopedics;  Laterality: Right;   TOTAL HIP ARTHROPLASTY Left 07/22/2020   Procedure: LEFT  HIP ARTHROPLASTY ANTERIOR APPROACH;  Surgeon: Kathryne Hitch, MD;  Location: [facility];  Service: Orthopedics;  Laterality: Left;  RNFA   TUBAL LIGATION      There were no vitals filed for this visit.   Subjective Assessment - 12/21/20 0903     Subjective Right hip and back pain today increased from busy day walking/running errands yesterday    Pertinent History B THR,  COPD with O2 dependency, migranes and cervical surgery    Currently in  Pain? Yes    Pain Score 5     Pain Location Buttocks    Pain Orientation Right    Pain Descriptors / Indicators Aching    Pain Type Acute pain    Pain Onset 1 to 4 weeks ago                [facility] PT Assessment - 12/21/20 0001       Assessment   Medical Diagnosis Lumbar radiculopathy                           [facility] Adult PT Treatment/Exercise - 12/21/20 0001       Lumbar Exercises: Stretches   Passive Hamstring Stretch Right;2 reps;30 seconds      Lumbar Exercises: Aerobic   Nustep level 1 x 6 min for dynamic warmup      Lumbar Exercises: Seated   Long Arc Quad on Chair Strengthening;Both;2 sets;10 reps    LAQ on Chair  Weights (lbs) 3      Lumbar Exercises: Supine   Bridge 20 reps    Bridge Limitations 10x PF, 10x DF    Other Supine Lumbar Exercises self-massage with tennis ball right piriformis while peforming RLE ER/IR      Knee/Hip Exercises: Standing   Hip Flexion Stengthening;2 sets;10 reps;Right    Hip Flexion Limitations 3 lbs    Hip Abduction Stengthening;Both;10 reps;2 sets    Abduction Limitations -1    Other Standing Knee Exercises sidestepping x 2 min 3 lbs      Knee/Hip Exercises: Seated   Ball Squeeze 2x10 2 sec                       PT Short Term Goals - 12/08/20 1414       PT SHORT TERM GOAL #1   Title Pt to be completing HEP to decrease radicular sx to buttock only    Time 2    Period Weeks    Status New    Target Date 12/22/20      PT SHORT TERM GOAL #2   Title Pt core and LE strength to be improved to allow pt to be able to come sit to stand from a firm, higher chair without UE assist    Time 2    Period Weeks    Status New               PT Long Term Goals - 12/08/20 1416       PT LONG TERM GOAL #1   Title PT to have an advance HEP to be able to erradicate radicular sx while walking.    Time 4    Period Weeks    Status New      PT LONG TERM GOAL #2   Title PT core and LE strength to be increased one grade to allow pt to be able to come sit to stand from a couch without UE assist    Time 4    Period Weeks    Status New    Target Date 01/05/21      PT LONG TERM GOAL #3   Title PT to be able to single leg stance on both LE for at least 10 seconds to decrease risk of falling    Time 4    Period Weeks    Status New      PT LONG TERM GOAL #4   Title  PT Rt hip and leg pain to be decreased to no greater than a 3/10 to allow pt to be able to walk 226 ft in a 2 minutes using her cane and O2    Time 4    Period Weeks    Status New                   Plan - 12/21/20 7209     Clinical Impression Statement Increased right buttocks  pain with WBing activities especially with RLE single leg stance activities. Difficulty with sit to stand due to decreased push-off with RLE due to pain/guarding in buttocks. Hypersensitive to touch/palpation at right piriformis. Increased pain with glute stretches and hamstring stretch limited to 45 degrees SLR position. Continued sessions indicated to improve right hip and back pain to enable normalized gait pattern    Personal Factors and Comorbidities Comorbidity 3+;Fitness    Comorbidities B THR, COPD< cervical surgery, anxiety, depression    Examination-Activity Limitations Bed Mobility;Caring for Others;Carry;Dressing;Lift;Locomotion Level;Stand;Stairs;Squat;Sit    Examination-Participation Restrictions Cleaning;Shop    Stability/Clinical Decision Making Evolving/Moderate complexity    Rehab Potential Fair    PT Frequency 3x / week    PT Duration 4 weeks    PT Treatment/Interventions Gait training;Stair training;Therapeutic exercise;Patient/family education;Manual techniques    PT Next Visit Plan begin improving gait mechanics, spinal mobility and stabilization.  Add minisquats, standing hip strengthening and balance training next session.    PT Home Exercise Plan t band resisted dorsiflexion B, knee extension B, hip abduction B, hip adduction B; 9/6: bridge and SLR; 12/15/20: clam and abd    Consulted and Agree with Plan of Care Patient             Patient will benefit from skilled therapeutic intervention in order to improve the following deficits and impairments:  Abnormal gait, Cardiopulmonary status limiting activity, Decreased activity tolerance, Decreased balance, Decreased mobility, Decreased range of motion, Decreased strength, Difficulty walking, Pain  Visit Diagnosis: Pain in right hip  Difficulty in walking, not elsewhere classified     Problem List Patient Active Problem List   Diagnosis Date Noted   Radiculopathy, lumbar region 12/08/2020   Status post left hip  replacement 07/22/2020   Status post right hip replacement 05/02/2020   Status post total replacement of right hip 04/15/2020   Avascular necrosis of bone of left hip (HCC) 02/10/2020   Avascular necrosis of bone of right hip (HCC) 02/10/2020   Allergic rhinitis 05/21/2019   Chronic asthmatic bronchitis (HCC) 04/30/2019   Chronic respiratory failure with hypoxia (HCC) 04/30/2019   Spinal stenosis of cervical region 04/05/2017   Partial tear of right subscapularis tendon    Incisional hernia, without obstruction or gangrene    Urinary frequency 03/21/2012   Migraine equivalent syndrome 03/21/2012   Insomnia 03/21/2012   9:47 AM, 12/21/20 M. Shary Decamp, PT, DPT Physical Therapist- [place] Office Number: [phone]   [facility] [address] [place], [place], [address] Phone: [phone]   Fax:  [phone]  Name: [name] MRN: [id number] Date of Birth: 1966/01/07

## 2020-12-23 ENCOUNTER — Ambulatory Visit (HOSPITAL_COMMUNITY): Payer: Medicare Other | Admitting: Physical Therapy

## 2020-12-23 ENCOUNTER — Encounter: Payer: Self-pay | Admitting: Family

## 2020-12-23 ENCOUNTER — Other Ambulatory Visit: Payer: Self-pay

## 2020-12-23 ENCOUNTER — Ambulatory Visit: Payer: Medicare Other | Admitting: Family

## 2020-12-23 VITALS — BP 92/60 | HR 92 | Temp 99.6°F | Resp 16 | Ht 64.5 in | Wt 148.0 lb

## 2020-12-23 DIAGNOSIS — J3089 Other allergic rhinitis: Secondary | ICD-10-CM | POA: Diagnosis not present

## 2020-12-23 DIAGNOSIS — R262 Difficulty in walking, not elsewhere classified: Secondary | ICD-10-CM | POA: Diagnosis not present

## 2020-12-23 DIAGNOSIS — M25551 Pain in right hip: Secondary | ICD-10-CM

## 2020-12-23 DIAGNOSIS — J302 Other seasonal allergic rhinitis: Secondary | ICD-10-CM

## 2020-12-23 DIAGNOSIS — J449 Chronic obstructive pulmonary disease, unspecified: Secondary | ICD-10-CM | POA: Diagnosis not present

## 2020-12-23 MED ORDER — ALBUTEROL SULFATE HFA 108 (90 BASE) MCG/ACT IN AERS: 2.0000 | INHALATION_SPRAY | RESPIRATORY_TRACT | 1 refills | Status: DC | PRN

## 2020-12-23 MED ORDER — STIOLTO RESPIMAT 2.5-2.5 MCG/ACT IN AERS: 2.0000 | INHALATION_SPRAY | Freq: Every day | RESPIRATORY_TRACT | 5 refills | Status: DC

## 2020-12-23 NOTE — Patient Instructions (Addendum)
1. Asthma-COPD overlap syndrome -Start prednisone 10 mg taking 2 tablets twice a day for 3 days,then 2 tablets in the morning on the 4th day, on the fifth day take one tablet and stop. While you are on this prednisone stop your prednisone 10 mg once a day -We are still waiting to see if you have been approved for Fasenra. I have messaged Tammy, our biologics coordinator, to see where we are with the approval process -Schedule an appointment to get a sample of Fasenra - Daily controller medication(s): Stiolto two puffs once daily + prednisone 10mg  daily + Singulair 10mg  daily - Rescue medications: albuterol nebulizer one vial every 4-6 hours as needed - Asthma control goals:  * Full participation in all desired activities (may need albuterol before activity) * Albuterol use two time or less a week on average (not counting use with activity) * Cough interfering with sleep two time or less a month * Oral steroids no more than once a year * No hospitalizations   2.  Seasonal and perennial allergic rhinitis (horse, tobacco leaf, grasses, ragweed, trees, indoor molds, outdoor molds, dust mites, cat and dog) - Continue with: Zyrtec (cetirizine) 10mg  tablet once daily and Singulair (montelukast) 10mg  daily and Nasacort (triamcinolone) two sprays per nostril daily AS NEEDED - Continue with nasal saline rinses.  Please let know if this treatment plan is not working well for you. Schedule a follow up appointment in 4 weeks or sooner if needed

## 2020-12-23 NOTE — Progress Notes (Signed)
[address] [place] [address] Dept: [phone]  FOLLOW UP NOTE  Patient ID: [name], female    DOB: May 26, 1965  Age: 55 y.o. MRN: [id number] Date of Office Visit: 12/23/2020  Assessment  Chief Complaint: Asthma (Says it is bad. Not sure if it is the pollen or something else. )  HPI [name] presents today for follow-up of asthma/COPD overlap syndrome and seasonal and perennial allergic rhinitis.  She was last seen on November 23, 2020 by Dr. Dellis Anes.  Asthma/COPD overlap syndrome is reported as not well controlled with Stiolto 2 puffs once a day, prednisone 10 mg once a day, Singulair 10 mg once daily, albuterol via nebulizer as needed, and oxygen at 3 L continuous during the day and night.  She reports that for the last several days she has had a cough that is mainly dry but can be productive at times.  When it is productive it is clear in color.  She also reports tightness in her chest, shortness of breath, and nocturnal awakenings due to breathing problems.  She denies any fever, chills, and wheezing.  Since her last office visit she has not required any trips to the emergency room or urgent care due to breathing problems or steroid burst.  She has been using her albuterol approximately 5 times daily.  She last saw Dr. Sherene Sires, her pulmonologist, in April.  She wonders if Harrington Challenger has been approved.  Seasonal and perennial allergic rhinitis is reported as moderately controlled with Zyrtec 10 mg once a day, Singulair 10 mg once a day, and Nasacort nasal spray as needed.  She reports rhinorrhea at times with the use of oxygen.  She denies nasal congestion and postnasal drip.  She has not had any sinus infections since we last saw her.  Drug Allergies:  Allergies  Allergen Reactions   Aspirin Shortness Of Breath    Causes asthma flares    Penicillins Other (See Comments)    UNSPECIFIED REACTION OF CHILDHOOD  Has patient had a PCN reaction  causing immediate rash, facial/tongue/throat swelling, SOB or lightheadedness with hypotension: NO Has patient had a PCN reaction causing severe rash involving mucus membranes or skin necrosis: NO Has patient had a PCN reaction that required hospitalization: no Has patient had a PCN reaction occurring within the last 10 years: NO If all of the above answers are "NO", then may proceed with Cephalosporin use.    Percocet [Oxycodone-Acetaminophen]     migraines   Tramadol     insomnia   Vicodin [Hydrocodone-Acetaminophen] Other (See Comments)    Headaches     Review of Systems: Review of Systems  Constitutional:  Negative for chills and fever.  HENT:         Reports rhinorrhea at times due to wearing oxygen.  Denies nasal congestion and postnasal drip.  Eyes:        Reports itchy watery eyes  Respiratory:  Positive for cough and shortness of breath. Negative for wheezing.        Reports a cough that can be productive and at times dry.  When it is productive it is clear.  Also reports tightness in her chest, shortness of breath, and nocturnal awakenings due to breathing problems.  She denies wheezing.  Cardiovascular:  Negative for chest pain and palpitations.  Gastrointestinal:        Reports reflux times.  Currently taking Protonix twice a day.  Genitourinary:  Positive for frequency.  Currently seeing Dr. Ronne Binning for bladder issues  Skin:  Negative for itching and rash.  Neurological:  Negative for headaches.    Physical Exam: BP 92/60   Pulse 92   Temp 99.6 F (37.6 C) (Temporal)   Resp 16   Ht 5' 4.5" (1.638 m)   Wt 148 lb (67.1 kg)   SpO2 98%   BMI 25.01 kg/m    Physical Exam Constitutional:      Appearance: Normal appearance.     Comments: Wearing oxygen.  HENT:     Head: Normocephalic and atraumatic.     Comments: Pharynx normal, eyes normal, ears normal, nose normal.    Right Ear: Tympanic membrane, ear canal and external ear normal.     Left Ear:  Tympanic membrane, ear canal and external ear normal.     Nose: Nose normal.     Mouth/Throat:     Mouth: Mucous membranes are moist.     Pharynx: Oropharynx is clear.  Eyes:     Conjunctiva/sclera: Conjunctivae normal.  Cardiovascular:     Rate and Rhythm: Regular rhythm.     Heart sounds: Normal heart sounds.  Pulmonary:     Effort: Pulmonary effort is normal.     Breath sounds: Normal breath sounds.     Comments: Lungs clear to auscultation. Musculoskeletal:     Cervical back: Neck supple.  Skin:    General: Skin is warm.  Neurological:     Mental Status: She is alert and oriented to person, place, and time.  Psychiatric:        Mood and Affect: Mood normal.        Behavior: Behavior normal.        Thought Content: Thought content normal.        Judgment: Judgment normal.    Diagnostics: FVC 1.87 L, FEV1 1.33 L.  Predicted FVC 3.26 L, predicted FEV1 2.58 L.  Spirometry indicates possible moderately severe restriction.  Assessment and Plan: 1. Asthma-COPD overlap syndrome (HCC)   2. Seasonal and perennial allergic rhinitis     Meds ordered this encounter  Medications   albuterol (VENTOLIN HFA) 108 (90 Base) MCG/ACT inhaler    Sig: Inhale 2 puffs into the lungs every 4 (four) hours as needed for shortness of breath.    Dispense:  3 each    Refill:  1   Tiotropium Bromide-Olodaterol (STIOLTO RESPIMAT) 2.5-2.5 MCG/ACT AERS    Sig: Inhale 2 puffs into the lungs daily.    Dispense:  4 g    Refill:  5    Order Specific Question:   Lot Number?    Answer:   [id number]    Order Specific Question:   Expiration Date?    Answer:   12/06/2021    Order Specific Question:   Quantity    Answer:   2    Patient Instructions  1. Asthma-COPD overlap syndrome -Start prednisone 10 mg taking 2 tablets twice a day for 3 days,then 2 tablets in the morning on the 4th day, on the fifth day take one tablet and stop. While you are on this prednisone stop your prednisone 10 mg once a day -We  are still waiting to see if you have been approved for Fasenra. I have messaged Tammy, our biologics coordinator, to see where we are with the approval process -Schedule an appointment to get a sample of Fasenra - Daily controller medication(s): Stiolto two puffs once daily + prednisone 10mg  daily + Singulair 10mg  daily - Rescue  medications: albuterol nebulizer one vial every 4-6 hours as needed - Asthma control goals:  * Full participation in all desired activities (may need albuterol before activity) * Albuterol use two time or less a week on average (not counting use with activity) * Cough interfering with sleep two time or less a month * Oral steroids no more than once a year * No hospitalizations   2.  Seasonal and perennial allergic rhinitis (horse, tobacco leaf, grasses, ragweed, trees, indoor molds, outdoor molds, dust mites, cat and dog) - Continue with: Zyrtec (cetirizine) 10mg  tablet once daily and Singulair (montelukast) 10mg  daily and Nasacort (triamcinolone) two sprays per nostril daily AS NEEDED - Continue with nasal saline rinses.  Please let know if this treatment plan is not working well for you. Schedule a follow up appointment in 4 weeks or sooner if needed      Return in about 4 weeks (around 01/20/2021), or if symptoms worsen or fail to improve.    Thank you for the opportunity to care for this patient.  Please do not hesitate to contact me with questions.  [place], FNP [facility]

## 2020-12-23 NOTE — Therapy (Signed)
[facility] [address] [place], [place], [address] Phone: [phone]   Fax:  [phone]  Physical Therapy Treatment  Patient Details  Name: [name] MRN: [id number] Date of Birth: Feb 22, 1966 Referring Provider (PT): Richardean Canal   Encounter Date: 12/23/2020   PT End of Session - 12/23/20 0912     Visit Number 6    Number of Visits 12    Date for PT Re-Evaluation 01/07/21    Authorization Type BCBS medicare    Progress Note Due on Visit 10    PT Start Time 0913    PT Stop Time 0955    PT Time Calculation (min) 42 min    Activity Tolerance Patient tolerated treatment well    Behavior During Therapy [facility] for tasks assessed/performed             Past Medical History:  Diagnosis Date   Anemia    Anxiety    Arthritis    Asthma    Cervical radiculopathy    Cluster headaches    COPD (chronic obstructive pulmonary disease) (HCC)    COPD (chronic obstructive pulmonary disease) with chronic bronchitis (HCC)    GERD (gastroesophageal reflux disease)    Hyperlipemia    Hypoglycemia    Hypothyroidism    Migraine    Pneumothorax    Pre-diabetes    Rectal discharge 07/28/2010   Shortness of breath    Sleep apnea    cannot tolerate-not using CPAP   Sluggishness 07/28/2010    Past Surgical History:  Procedure Laterality Date   ABDOMINAL HYSTERECTOMY     with bladder suspension   CERVICAL FUSION     CHOLECYSTECTOMY     COLONOSCOPY N/A 07/30/2013   Procedure: COLONOSCOPY;  Surgeon: Malissa Hippo, MD;  Location: [facility];  Service: Endoscopy;  Laterality: N/A;  930   EXAM UNDER ANESTHESIA WITH MANIPULATION OF SHOULDER Right 10/11/2016   Procedure: EXAM UNDER ANESTHESIA WITH MANIPULATION OF SHOULDER;  Surgeon: Vickki Hearing, MD;  Location: [facility] ORS;  Service: Orthopedics;  Laterality: Right;   HERNIA REPAIR     INCISIONAL HERNIA REPAIR N/A 07/30/2016   Procedure: HERNIA REPAIR INCISIONAL WITH MESH;   Surgeon: [name], MD;  Location: [facility] ORS;  Service: General;  Laterality: N/A;   LOBECTOMY     right lung    POSTERIOR CERVICAL LAMINECTOMY Right 04/05/2017   Procedure: Right C6-7 Posterior cervical laminectomy;  Surgeon: Donalee Citrin, MD;  Location: [facility] OR;  Service: Neurosurgery;  Laterality: Right;  Right C6-7 Posterior cervical laminectomy   SHOULDER ARTHROSCOPY WITH ROTATOR CUFF REPAIR Right 10/11/2016   Procedure: SHOULDER ARTHROSCOPY;  Surgeon: Vickki Hearing, MD;  Location: [facility];  Service: Orthopedics;  Laterality: Right;   TOTAL HIP ARTHROPLASTY Right 04/15/2020   Procedure: RIGHT TOTAL HIP ARTHROPLASTY ANTERIOR APPROACH;  Surgeon: Kathryne Hitch, MD;  Location: [facility] ORS;  Service: Orthopedics;  Laterality: Right;   TOTAL HIP ARTHROPLASTY Left 07/22/2020   Procedure: LEFT  HIP ARTHROPLASTY ANTERIOR APPROACH;  Surgeon: Kathryne Hitch, MD;  Location: [facility] ORS;  Service: Orthopedics;  Laterality: Left;  RNFA   TUBAL LIGATION      There were no vitals filed for this visit.   Subjective Assessment - 12/23/20 0915     Subjective Pt is sore she fell at [facility] yesterday as some kids were running around and one of them hit her cane and it went from under her.    Pertinent History B THR,  COPD with O2 dependency, migranes and cervical surgery    Patient Stated Goals less pain walk better    Currently in Pain? Yes    Pain Score 7     Pain Location Back    Pain Orientation Right    Pain Descriptors / Indicators Aching    Pain Type Acute pain    Pain Onset 1 to 4 weeks ago    Pain Frequency Constant    Aggravating Factors  WB                               [facility] Adult PT Treatment/Exercise - 12/23/20 0001       Lumbar Exercises: Standing   Heel Raises 10 reps    Functional Squats 10 reps    Other Standing Lumbar Exercises 10B hip abductoion and extension, side stepping in // x 2 RT ; step up 4" x 10 B, SLS B    Other Standing Lumbar Exercises  marching x10 , rockerboard x 2      Lumbar Exercises: Seated   Long Arc Quad on Chair Strengthening;Both;10 reps    LAQ on Chair Weights (lbs) 4    Sit to Stand 5 reps    Sit to Stand Limitations no UE assist use arms stretched outright                       PT Short Term Goals - 12/23/20 0914       PT SHORT TERM GOAL #1   Title Pt to be completing HEP to decrease radicular sx to buttock only    Time 2    Period Weeks    Status On-going    Target Date 12/22/20      PT SHORT TERM GOAL #2   Title Pt core and LE strength to be improved to allow pt to be able to come sit to stand from a firm, higher chair without UE assist    Time 2    Period Weeks    Status On-going               PT Long Term Goals - 12/23/20 0914       PT LONG TERM GOAL #1   Title PT to have an advance HEP to be able to erradicate radicular sx while walking.    Time 4    Period Weeks    Status On-going      PT LONG TERM GOAL #2   Title PT core and LE strength to be increased one grade to allow pt to be able to come sit to stand from a couch without UE assist    Time 4    Period Weeks    Status On-going      PT LONG TERM GOAL #3   Title PT to be able to single leg stance on both LE for at least 10 seconds to decrease risk of falling    Time 4    Period Weeks    Status On-going      PT LONG TERM GOAL #4   Title PT Rt hip and leg pain to be decreased to no greater than a 3/10 to allow pt to be able to walk 226 ft in a 2 minutes using her cane and O2    Time 4    Period Weeks    Status On-going  Plan - 12/23/20 0912     Clinical Impression Statement PT continues to [facility] form with verbal instructions  with exercises.   Stressed recommended pt use heat or ice for current sorenes from fall.  Added balance activity as well as squats today.    Personal Factors and Comorbidities Comorbidity 3+;Fitness    Comorbidities B THR, COPD< cervical surgery,  anxiety, depression    Examination-Activity Limitations Bed Mobility;Caring for Others;Carry;Dressing;Lift;Locomotion Level;Stand;Stairs;Squat;Sit    Examination-Participation Restrictions Cleaning;Shop    Stability/Clinical Decision Making Evolving/Moderate complexity    Rehab Potential Fair    PT Frequency 3x / week    PT Duration 4 weeks    PT Treatment/Interventions Gait training;Stair training;Therapeutic exercise;Patient/family education;Manual techniques    PT Next Visit Plan continue improving gait mechanics, spinal mobility and stabilization.    PT Home Exercise Plan t band resisted dorsiflexion B, knee extension B, hip abduction B, hip adduction B; 9/6: bridge and SLR; 12/15/20: clam and abd    Consulted and Agree with Plan of Care Patient             Patient will benefit from skilled therapeutic intervention in order to improve the following deficits and impairments:  Abnormal gait, Cardiopulmonary status limiting activity, Decreased activity tolerance, Decreased balance, Decreased mobility, Decreased range of motion, Decreased strength, Difficulty walking, Pain  Visit Diagnosis: Pain in right hip  Difficulty in walking, not elsewhere classified     Problem List Patient Active Problem List   Diagnosis Date Noted   Radiculopathy, lumbar region 12/08/2020   Status post left hip replacement 07/22/2020   Status post right hip replacement 05/02/2020   Status post total replacement of right hip 04/15/2020   Avascular necrosis of bone of left hip (HCC) 02/10/2020   Avascular necrosis of bone of right hip (HCC) 02/10/2020   Allergic rhinitis 05/21/2019   Chronic asthmatic bronchitis (HCC) 04/30/2019   Chronic respiratory failure with hypoxia (HCC) 04/30/2019   Spinal stenosis of cervical region 04/05/2017   Partial tear of right subscapularis tendon    Incisional hernia, without obstruction or gangrene    Urinary frequency 03/21/2012   Migraine equivalent syndrome  03/21/2012   Insomnia 03/21/2012  Virgina Organ, PT CLT [phone] , PT 12/23/2020, 9:57 AM  [facility] [address] [place], [place], [address] Phone: [phone]   Fax:  [phone]  Name: GABBI WHETSTONE MRN: [id number] Date of Birth: 19-Mar-1966

## 2020-12-26 ENCOUNTER — Ambulatory Visit (HOSPITAL_COMMUNITY): Payer: Medicare Other

## 2020-12-26 ENCOUNTER — Other Ambulatory Visit: Payer: Self-pay

## 2020-12-26 ENCOUNTER — Telehealth: Payer: Self-pay

## 2020-12-26 ENCOUNTER — Ambulatory Visit (INDEPENDENT_AMBULATORY_CARE_PROVIDER_SITE_OTHER): Payer: Medicare Other

## 2020-12-26 DIAGNOSIS — J4550 Severe persistent asthma, uncomplicated: Secondary | ICD-10-CM | POA: Diagnosis not present

## 2020-12-26 DIAGNOSIS — R262 Difficulty in walking, not elsewhere classified: Secondary | ICD-10-CM

## 2020-12-26 DIAGNOSIS — M25551 Pain in right hip: Secondary | ICD-10-CM

## 2020-12-26 NOTE — Progress Notes (Addendum)
Immunotherapy   Patient Details  Name: [name] MRN: [id number] Date of Birth: 04-12-1965  12/26/2020  [address] started injections for asthma. Patient received 30 mg in her right arm. Lot # [id number] 03/08/2022. Patient waited 30 minutes with no problems. Frequency:every 4 weeks times 3 doses then every 8 weeks Epi-Pen:Epi-Pen Available  Consent signed and patient instructions given.   Dub Mikes 12/26/2020, 9:11 AM

## 2020-12-26 NOTE — Therapy (Signed)
[facility] [address] [place], [place], [address] Phone: [phone]   Fax:  [phone]  Physical Therapy Treatment  Patient Details  Name: [name] MRN: [id number] Date of Birth: [birth year] Referring Provider (PT): Richardean Canal   Encounter Date: 12/26/2020   PT End of Session - 12/26/20 1032     Visit Number 7    Number of Visits 12    Date for PT Re-Evaluation 01/07/21    Authorization Type BCBS medicare    Progress Note Due on Visit 10    PT Start Time 1030    PT Stop Time 1115    PT Time Calculation (min) 45 min    Activity Tolerance Patient tolerated treatment well    Behavior During Therapy [facility] for tasks assessed/performed             Past Medical History:  Diagnosis Date   Anemia    Anxiety    Arthritis    Asthma    Cervical radiculopathy    Cluster headaches    COPD (chronic obstructive pulmonary disease) (HCC)    COPD (chronic obstructive pulmonary disease) with chronic bronchitis (HCC)    GERD (gastroesophageal reflux disease)    Hyperlipemia    Hypoglycemia    Hypothyroidism    Migraine    Pneumothorax    Pre-diabetes    Rectal discharge 07/28/2010   Shortness of breath    Sleep apnea    cannot tolerate-not using CPAP   Sluggishness 07/28/2010    Past Surgical History:  Procedure Laterality Date   ABDOMINAL HYSTERECTOMY     with bladder suspension   CERVICAL FUSION     CHOLECYSTECTOMY     COLONOSCOPY N/A 07/30/2013   Procedure: COLONOSCOPY;  Surgeon: Malissa Hippo, MD;  [facility];  Service: Endoscopy;  Laterality: N/A;  930   EXAM UNDER ANESTHESIA WITH MANIPULATION OF SHOULDER Right 10/11/2016   Procedure: EXAM UNDER ANESTHESIA WITH MANIPULATION OF SHOULDER;  Surgeon: Vickki Hearing, MD;  Location: [facility];  Service: Orthopedics;  Laterality: Right;   HERNIA REPAIR     INCISIONAL HERNIA REPAIR N/A 07/30/2016   Procedure: HERNIA REPAIR INCISIONAL WITH MESH;   Surgeon: [name], MD;  Location: [facility];  Service: General;  Laterality: N/A;   LOBECTOMY     right lung    POSTERIOR CERVICAL LAMINECTOMY Right 04/05/2017   Procedure: Right C6-7 Posterior cervical laminectomy;  Surgeon: Donalee Citrin, MD;  [facility]: [facility] OR;  Service: Neurosurgery;  Laterality: Right;  Right C6-7 Posterior cervical laminectomy   SHOULDER ARTHROSCOPY WITH ROTATOR CUFF REPAIR Right 10/11/2016   Procedure: SHOULDER ARTHROSCOPY;  Surgeon: Vickki Hearing, MD;  Location: [facility];  Service: Orthopedics;  Laterality: Right;   TOTAL HIP ARTHROPLASTY Right 04/15/2020   Procedure: RIGHT TOTAL HIP ARTHROPLASTY ANTERIOR APPROACH;  Surgeon: Kathryne Hitch, MD;  Location: [facility];  Service: Orthopedics;  Laterality: Right;   TOTAL HIP ARTHROPLASTY Left 07/22/2020   Procedure: LEFT  HIP ARTHROPLASTY ANTERIOR APPROACH;  Surgeon: Kathryne Hitch, MD;  Location: [facility];  Service: Orthopedics;  Laterality: Left;  RNFA   TUBAL LIGATION      There were no vitals filed for this visit.   Subjective Assessment - 12/26/20 1033     Subjective Pt reports breathing difficulties and low energy over the weekend. Still difficult to put increased weight on RLE    Pertinent History B THR,  COPD with O2 dependency, migranes and cervical surgery  Patient Stated Goals less pain walk better    Currently in Pain? Yes    Pain Score 4     Pain Location Back    Pain Orientation Right;Lower    Pain Descriptors / Indicators Aching    Pain Type Acute pain    Pain Onset 1 to 4 weeks ago                [facility] PT Assessment - 12/26/20 0001       Assessment   Medical Diagnosis Lumbar radiculopathy                           [facility] Adult PT Treatment/Exercise - 12/26/20 0001       Lumbar Exercises: Aerobic   Nustep level 1 x 6 min for dynamic warmup      Lumbar Exercises: Machines for Strengthening   Cybex Lumbar Extension 3 plates, 7E93      Knee/Hip Exercises:  Machines for Strengthening   Cybex Knee Flexion 5 plates, 8B01    Other Machine --      Knee/Hip Exercises: Standing   Hip Flexion Stengthening;Both;3 sets    Hip Flexion Limitations 5 lbs 1x20,15,10    Gait Training resisted retro-forward 2x2 min 3 plates      Knee/Hip Exercises: Seated   Long Arc [employer] Strengthening;Both;3 sets    [employer] Weight 5 lbs.    Long Arc Quad Limitations 1x20,15,10 reps                     PT Education - 12/26/20 1116     Education Details discussion on use of 5 lbs ankle weights for HEP for high rep, low resistance training    Person(s) Educated Patient    Methods Explanation    Comprehension Verbalized understanding              PT Short Term Goals - 12/23/20 0914       PT SHORT TERM GOAL #1   Title Pt to be completing HEP to decrease radicular sx to buttock only    Time 2    Period Weeks    Status On-going    Target Date 12/22/20      PT SHORT TERM GOAL #2   Title Pt core and LE strength to be improved to allow pt to be able to come sit to stand from a firm, higher chair without UE assist    Time 2    Period Weeks    Status On-going               PT Long Term Goals - 12/23/20 0914       PT LONG TERM GOAL #1   Title PT to have an advance HEP to be able to erradicate radicular sx while walking.    Time 4    Period Weeks    Status On-going      PT LONG TERM GOAL #2   Title PT core and LE strength to be increased one grade to allow pt to be able to come sit to stand from a couch without UE assist    Time 4    Period Weeks    Status On-going      PT LONG TERM GOAL #3   Title PT to be able to single leg stance on both LE for at least 10 seconds to decrease risk of falling    Time 4  Period Weeks    Status On-going      PT LONG TERM GOAL #4   Title PT Rt hip and leg pain to be decreased to no greater than a 3/10 to allow pt to be able to walk 226 ft in a 2 minutes using her cane and O2    Time 4     Period Weeks    Status On-going                   Plan - 12/26/20 1117     Clinical Impression Statement Progressing with POC details and demo improve LE strength and endurance as evidenced by repetition and resistance increases. Continues to experience right low back/buttocks pain which is increased with WBing and RLE single limb support. Pt notes pulling pain/sensation along right low back/buttocks with right hip flexion. Continued sessions indicated to improve strength and decrease right back/hip pain    Personal Factors and Comorbidities Comorbidity 3+;Fitness    Comorbidities B THR, COPD< cervical surgery, anxiety, depression    Examination-Activity Limitations Bed Mobility;Caring for Others;Carry;Dressing;Lift;Locomotion Level;Stand;Stairs;Squat;Sit    Examination-Participation Restrictions Cleaning;Shop    Stability/Clinical Decision Making Evolving/Moderate complexity    Rehab Potential Fair    PT Frequency 3x / week    PT Duration 4 weeks    PT Treatment/Interventions Gait training;Stair training;Therapeutic exercise;Patient/family education;Manual techniques    PT Next Visit Plan continue improving gait mechanics, spinal mobility and stabilization.    PT Home Exercise Plan t band resisted dorsiflexion B, knee extension B, hip abduction B, hip adduction B; 9/6: bridge and SLR; 12/15/20: clam and abd    Consulted and Agree with Plan of Care Patient             Patient will benefit from skilled therapeutic intervention in order to improve the following deficits and impairments:  Abnormal gait, Cardiopulmonary status limiting activity, Decreased activity tolerance, Decreased balance, Decreased mobility, Decreased range of motion, Decreased strength, Difficulty walking, Pain  Visit Diagnosis: Pain in right hip  Difficulty in walking, not elsewhere classified     Problem List Patient Active Problem List   Diagnosis Date Noted   Radiculopathy, lumbar region  12/08/2020   Status post left hip replacement 07/22/2020   Status post right hip replacement 05/02/2020   Status post total replacement of right hip 04/15/2020   Avascular necrosis of bone of left hip (HCC) 02/10/2020   Avascular necrosis of bone of right hip (HCC) 02/10/2020   Allergic rhinitis 05/21/2019   Chronic asthmatic bronchitis (HCC) 04/30/2019   Chronic respiratory failure with hypoxia (HCC) 04/30/2019   Spinal stenosis of cervical region 04/05/2017   Partial tear of right subscapularis tendon    Incisional hernia, without obstruction or gangrene    Urinary frequency 03/21/2012   Migraine equivalent syndrome 03/21/2012   Insomnia 03/21/2012    Dion Body, PT 12/26/2020, 11:28 AM  [facility] [address] [place], [place], [address] Phone: [phone]   Fax:  [phone]  Name: [name] MRN: [id number] Date of Birth: 08-14-1965

## 2020-12-26 NOTE — Telephone Encounter (Signed)
Need posting sheet please

## 2020-12-26 NOTE — Telephone Encounter (Signed)
Patient wanting to go ahead with Stimulator Implant as discussed in last visit.  Please advise.  Call back:  [phone]  Thanks, Rosey Bath

## 2020-12-27 NOTE — Telephone Encounter (Signed)
Patient called today checking on the status of the order.  Please advise.  Thanks, Rosey Bath

## 2020-12-27 NOTE — Telephone Encounter (Signed)
Patient called and notified MD sent message requesting post sheet. Will f/u with MD this week. Patient voiced understanding.

## 2020-12-28 ENCOUNTER — Ambulatory Visit (HOSPITAL_COMMUNITY): Payer: Medicare Other | Admitting: Physical Therapy

## 2020-12-28 ENCOUNTER — Other Ambulatory Visit: Payer: Self-pay

## 2020-12-28 DIAGNOSIS — R262 Difficulty in walking, not elsewhere classified: Secondary | ICD-10-CM | POA: Diagnosis not present

## 2020-12-28 DIAGNOSIS — M25551 Pain in right hip: Secondary | ICD-10-CM

## 2020-12-28 NOTE — Therapy (Signed)
[facility] [address] [place], [place], [address] Phone: [phone]   Fax:  [phone]  Physical Therapy Treatment  Patient Details  Name: [name] MRN: [id number] Date of Birth: [birth year] Referring Provider (PT): Richardean Canal   Encounter Date: 12/28/2020   PT End of Session - 12/28/20 1150     Visit Number 8    Number of Visits 12    Date for PT Re-Evaluation 01/07/21    Authorization Type [employer]BS medicare    Progress Note Due on Visit 10    PT Start Time 1130    PT Stop Time 1215    PT Time Calculation (min) 45 min    Activity Tolerance Patient tolerated treatment well    Behavior During Therapy [facility] for tasks assessed/performed             Past Medical History:  Diagnosis Date   Anemia    Anxiety    Arthritis    Asthma    Cervical radiculopathy    Cluster headaches    COPD (chronic obstructive pulmonary disease) (HCC)    COPD (chronic obstructive pulmonary disease) with chronic bronchitis (HCC)    GERD (gastroesophageal reflux disease)    Hyperlipemia    Hypoglycemia    Hypothyroidism    Migraine    Pneumothorax    Pre-diabetes    Rectal discharge 07/28/2010   Shortness of breath    Sleep apnea    cannot tolerate-not using CPAP   Sluggishness 07/28/2010    Past Surgical History:  Procedure Laterality Date   ABDOMINAL HYSTERECTOMY     with bladder suspension   CERVICAL FUSION     CHOLECYSTECTOMY     COLONOSCOPY N/A 07/30/2013   Procedure: COLONOSCOPY;  Surgeon: Malissa Hippo, MD;  [facility];  Service: Endoscopy;  Laterality: N/A;  930   EXAM UNDER ANESTHESIA WITH MANIPULATION OF SHOULDER Right 10/11/2016   Procedure: EXAM UNDER ANESTHESIA WITH MANIPULATION OF SHOULDER;  Surgeon: Vickki Hearing, MD;  Location: [facility];  Service: Orthopedics;  Laterality: Right;   HERNIA REPAIR     INCISIONAL HERNIA REPAIR N/A 07/30/2016   Procedure: HERNIA REPAIR INCISIONAL WITH MESH;   Surgeon: [name], MD;  Location: [facility];  Service: General;  Laterality: N/A;   LOBECTOMY     right lung    POSTERIOR CERVICAL LAMINECTOMY Right 04/05/2017   Procedure: Right C6-7 Posterior cervical laminectomy;  Surgeon: Donalee Citrin, MD;  Location: [facility] OR;  Service: Neurosurgery;  Laterality: Right;  Right C6-7 Posterior cervical laminectomy   SHOULDER ARTHROSCOPY WITH ROTATOR CUFF REPAIR Right 10/11/2016   Procedure: SHOULDER ARTHROSCOPY;  Surgeon: Vickki Hearing, MD;  Location: [facility];  Service: Orthopedics;  Laterality: Right;   TOTAL HIP ARTHROPLASTY Right 04/15/2020   Procedure: RIGHT TOTAL HIP ARTHROPLASTY ANTERIOR APPROACH;  Surgeon: Kathryne Hitch, MD;  [facility]: [facility];  Service: Orthopedics;  Laterality: Right;   TOTAL HIP ARTHROPLASTY Left 07/22/2020   Procedure: LEFT  HIP ARTHROPLASTY ANTERIOR APPROACH;  Surgeon: Kathryne Hitch, MD;  Location: [facility];  Service: Orthopedics;  Laterality: Left;  RNFA   TUBAL LIGATION      There were no vitals filed for this visit.   Subjective Assessment - 12/28/20 1133     Subjective Pt has no complaints.    Pertinent History B THR,  COPD with O2 dependency, migranes and cervical surgery    Limitations Sitting;Lifting;Standing;Walking;House hold activities    How long can you  sit comfortably? 45 was 15    How long can you stand comfortably? 45 minutes was less than five    How long can you walk comfortably? with cane and oxygen:  tends to get out of breath after 5-10 minuts    Patient Stated Goals less pain walk better    Pain Onset 1 to 4 weeks ago                               [facility] Adult PT Treatment/Exercise - 12/28/20 0001       Lumbar Exercises: Machines for Strengthening   Cybex Lumbar Extension 3 plates, x 15    Cybex Knee Extension 1 PL x 15    Leg Press 3 PL x 15 ,      Lumbar Exercises: Standing   Heel Raises 10 reps    Functional Squats 10 reps;5 seconds    Other Standing Lumbar  Exercises step up 4" B x  10; B hip abductoion and extension,with 4#side stepping  x 1RT ; step up 4" x 10 B, SLS B    Other Standing Lumbar Exercises rocker board 2' ; marching x 10                       PT Short Term Goals - 12/23/20 0914       PT SHORT TERM GOAL #1   Title Pt to be completing HEP to decrease radicular sx to buttock only    Time 2    Period Weeks    Status On-going    Target Date 12/22/20      PT SHORT TERM GOAL #2   Title Pt core and LE strength to be improved to allow pt to be able to come sit to stand from a firm, higher chair without UE assist    Time 2    Period Weeks    Status On-going               PT Long Term Goals - 12/23/20 0914       PT LONG TERM GOAL #1   Title PT to have an advance HEP to be able to erradicate radicular sx while walking.    Time 4    Period Weeks    Status On-going      PT LONG TERM GOAL #2   Title PT core and LE strength to be increased one grade to allow pt to be able to come sit to stand from a couch without UE assist    Time 4    Period Weeks    Status On-going      PT LONG TERM GOAL #3   Title PT to be able to single leg stance on both LE for at least 10 seconds to decrease risk of falling    Time 4    Period Weeks    Status On-going      PT LONG TERM GOAL #4   Title PT Rt hip and leg pain to be decreased to no greater than a 3/10 to allow pt to be able to walk 226 ft in a 2 minutes using her cane and O2    Time 4    Period Weeks    Status On-going                   Plan - 12/28/20 1153     Clinical Impression  Statement Added step ups and leg press to pt exercise program.  PT needed no rest breaks throughout treatment today.    Personal Factors and Comorbidities Comorbidity 3+;Fitness    Comorbidities B THR, COPD< cervical surgery, anxiety, depression    Examination-Activity Limitations Bed Mobility;Caring for Others;Carry;Dressing;Lift;Locomotion Level;Stand;Stairs;Squat;Sit     Examination-Participation Restrictions Cleaning;Shop    Stability/Clinical Decision Making Evolving/Moderate complexity    Rehab Potential Fair    PT Frequency 3x / week    PT Duration 4 weeks    PT Treatment/Interventions Gait training;Stair training;Therapeutic exercise;Patient/family education;Manual techniques    PT Next Visit Plan continue improving gait mechanics, spinal mobility and stabilization.    PT Home Exercise Plan t band resisted dorsiflexion B, knee extension B, hip abduction B, hip adduction B; 9/6: bridge and SLR; 12/15/20: clam and abd    Consulted and Agree with Plan of Care Patient             Patient will benefit from skilled therapeutic intervention in order to improve the following deficits and impairments:  Abnormal gait, Cardiopulmonary status limiting activity, Decreased activity tolerance, Decreased balance, Decreased mobility, Decreased range of motion, Decreased strength, Difficulty walking, Pain  Visit Diagnosis: Pain in right hip  Difficulty in walking, not elsewhere classified     Problem List Patient Active Problem List   Diagnosis Date Noted   Radiculopathy, lumbar region 12/08/2020   Status post left hip replacement 07/22/2020   Status post right hip replacement 05/02/2020   Status post total replacement of right hip 04/15/2020   Avascular necrosis of bone of left hip (HCC) 02/10/2020   Avascular necrosis of bone of right hip (HCC) 02/10/2020   Allergic rhinitis 05/21/2019   Chronic asthmatic bronchitis (HCC) 04/30/2019   Chronic respiratory failure with hypoxia (HCC) 04/30/2019   Spinal stenosis of cervical region 04/05/2017   Partial tear of right subscapularis tendon    Incisional hernia, without obstruction or gangrene    Urinary frequency 03/21/2012   Migraine equivalent syndrome 03/21/2012   Insomnia 03/21/2012    Virgina Organ, PT CLT [phone] , PT 12/28/2020, 12:09 PM  [facility] [address] [place], [place], [address] Phone: [phone]   Fax:  [phone]  Name: [name] MRN: [id number] Date of Birth: 08/05/1965

## 2020-12-30 ENCOUNTER — Encounter: Payer: Self-pay | Admitting: Internal Medicine

## 2020-12-30 ENCOUNTER — Ambulatory Visit: Payer: Medicare Other | Admitting: Internal Medicine

## 2020-12-30 ENCOUNTER — Other Ambulatory Visit: Payer: Self-pay

## 2020-12-30 ENCOUNTER — Ambulatory Visit (HOSPITAL_COMMUNITY): Payer: Medicare Other | Admitting: Physical Therapy

## 2020-12-30 DIAGNOSIS — J449 Chronic obstructive pulmonary disease, unspecified: Secondary | ICD-10-CM

## 2020-12-30 DIAGNOSIS — J9611 Chronic respiratory failure with hypoxia: Secondary | ICD-10-CM | POA: Diagnosis not present

## 2020-12-30 DIAGNOSIS — R262 Difficulty in walking, not elsewhere classified: Secondary | ICD-10-CM

## 2020-12-30 DIAGNOSIS — M25551 Pain in right hip: Secondary | ICD-10-CM

## 2020-12-30 MED ORDER — STIOLTO RESPIMAT 2.5-2.5 MCG/ACT IN AERS: 2.0000 | INHALATION_SPRAY | Freq: Every day | RESPIRATORY_TRACT | 0 refills | Status: DC

## 2020-12-30 NOTE — Therapy (Signed)
[facility] [address] [place], [place], [address] Phone: [phone]   Fax:  [phone]  Physical Therapy Treatment  Patient Details  Name: [name] MRN: [id number] Date of Birth: 09/02/1965 Referring Provider (PT): Richardean Canal   Encounter Date: 12/30/2020   PT End of Session - 12/30/20 0929     Visit Number 9    Number of Visits 12    Date for PT Re-Evaluation 01/07/21    Authorization Type [employer] medicare    Progress Note Due on Visit 10    PT Start Time [phone]    PT Stop Time 0918    PT Time Calculation (min) 45 min    Activity Tolerance Patient tolerated treatment well    Behavior During Therapy [facility] for tasks assessed/performed             Past Medical History:  Diagnosis Date   Anemia    Anxiety    Arthritis    Asthma    Cervical radiculopathy    Cluster headaches    COPD (chronic obstructive pulmonary disease) (HCC)    COPD (chronic obstructive pulmonary disease) with chronic bronchitis (HCC)    GERD (gastroesophageal reflux disease)    Hyperlipemia    Hypoglycemia    Hypothyroidism    Migraine    Pneumothorax    Pre-diabetes    Rectal discharge 07/28/2010   Shortness of breath    Sleep apnea    cannot tolerate-not using CPAP   Sluggishness 07/28/2010    Past Surgical History:  Procedure Laterality Date   ABDOMINAL HYSTERECTOMY     with bladder suspension   CERVICAL FUSION     CHOLECYSTECTOMY     COLONOSCOPY N/A 07/30/2013   Procedure: COLONOSCOPY;  Surgeon: Malissa Hippo, MD;  Location: [facility];  Service: Endoscopy;  Laterality: N/A;  930   EXAM UNDER ANESTHESIA WITH MANIPULATION OF SHOULDER Right 10/11/2016   Procedure: EXAM UNDER ANESTHESIA WITH MANIPULATION OF SHOULDER;  Surgeon: Vickki Hearing, MD;  Location: [facility] ORS;  Service: Orthopedics;  Laterality: Right;   HERNIA REPAIR     INCISIONAL HERNIA REPAIR N/A 07/30/2016   Procedure: HERNIA REPAIR INCISIONAL WITH MESH;   Surgeon: [name], MD;  Location: [facility] ORS;  Service: General;  Laterality: N/A;   LOBECTOMY     right lung    POSTERIOR CERVICAL LAMINECTOMY Right 04/05/2017   Procedure: Right C6-7 Posterior cervical laminectomy;  Surgeon: Donalee Citrin, MD;  Location: [facility] OR;  Service: Neurosurgery;  Laterality: Right;  Right C6-7 Posterior cervical laminectomy   SHOULDER ARTHROSCOPY WITH ROTATOR CUFF REPAIR Right 10/11/2016   Procedure: SHOULDER ARTHROSCOPY;  Surgeon: Vickki Hearing, MD;  Location: [facility] ORS;  Service: Orthopedics;  Laterality: Right;   TOTAL HIP ARTHROPLASTY Right 04/15/2020   Procedure: RIGHT TOTAL HIP ARTHROPLASTY ANTERIOR APPROACH;  Surgeon: Kathryne Hitch, MD;  Location: [facility] ORS;  Service: Orthopedics;  Laterality: Right;   TOTAL HIP ARTHROPLASTY Left 07/22/2020   Procedure: LEFT  HIP ARTHROPLASTY ANTERIOR APPROACH;  Surgeon: Kathryne Hitch, MD;  Location: [facility] ORS;  Service: Orthopedics;  Laterality: Left;  RNFA   TUBAL LIGATION      There were no vitals filed for this visit.   Subjective Assessment - 12/30/20 0841     Subjective pt states her hip is feeling good today and without pain or complaints.    Currently in Pain? No/denies  [facility] Adult PT Treatment/Exercise - 12/30/20 0001       Lumbar Exercises: Machines for Strengthening   Cybex Lumbar Extension 3 plates, 2V95    Cybex Knee Extension 1 PL 2x10    Leg Press 3 PL 2x10      Lumbar Exercises: Standing   Heel Raises 15 reps    Functional Squats 10 reps    Other Standing Lumbar Exercises step up 4" B x  10; B hip abduction and extension, side stepping  x 2 RT ; step up 4" x 15 B,lateral step ups 4" 15X SLS B    Other Standing Lumbar Exercises vectors 5X5" with 1 HHA                       PT Short Term Goals - 12/23/20 0914       PT SHORT TERM GOAL #1   Title Pt to be completing HEP to decrease radicular sx to buttock only    Time 2     Period Weeks    Status On-going    Target Date 12/22/20      PT SHORT TERM GOAL #2   Title Pt core and LE strength to be improved to allow pt to be able to come sit to stand from a firm, higher chair without UE assist    Time 2    Period Weeks    Status On-going               PT Long Term Goals - 12/23/20 0914       PT LONG TERM GOAL #1   Title PT to have an advance HEP to be able to erradicate radicular sx while walking.    Time 4    Period Weeks    Status On-going      PT LONG TERM GOAL #2   Title PT core and LE strength to be increased one grade to allow pt to be able to come sit to stand from a couch without UE assist    Time 4    Period Weeks    Status On-going      PT LONG TERM GOAL #3   Title PT to be able to single leg stance on both LE for at least 10 seconds to decrease risk of falling    Time 4    Period Weeks    Status On-going      PT LONG TERM GOAL #4   Title PT Rt hip and leg pain to be decreased to no greater than a 3/10 to allow pt to be able to walk 226 ft in a 2 minutes using her cane and O2    Time 4    Period Weeks    Status On-going                   Plan - 12/30/20 0930     Clinical Impression Statement Continued with LE and hip strengthening exercises in standing.  Added lateral step ups to POC and able to increase most reps to 15 today.  Pt required cues for form but with generally good posturing and minimal substitution.  Continues to have episodes of Rt LE giving way; pt reports no pain just feels like the glute mm will not hold her.  Added vectors to help strengthen this mm and added to HEP.  No pain or issues during session today.  No seated rest breaks needed or fatigue noted.  Personal Factors and Comorbidities Comorbidity 3+;Fitness    Comorbidities B THR, COPD< cervical surgery, anxiety, depression    Examination-Activity Limitations Bed Mobility;Caring for Others;Carry;Dressing;Lift;Locomotion Level;Stand;Stairs;Squat;Sit     Examination-Participation Restrictions Cleaning;Shop    Stability/Clinical Decision Making Evolving/Moderate complexity    Rehab Potential Fair    PT Frequency 3x / week    PT Duration 4 weeks    PT Treatment/Interventions Gait training;Stair training;Therapeutic exercise;Patient/family education;Manual techniques    PT Next Visit Plan continue improving gait mechanics, spinal mobility and stabilization.    PT Home Exercise Plan t band resisted dorsiflexion B, knee extension B, hip abduction B, hip adduction B; 9/6: bridge and SLR; 12/15/20: clam and abd  9/23: vectors    Consulted and Agree with Plan of Care Patient             Patient will benefit from skilled therapeutic intervention in order to improve the following deficits and impairments:  Abnormal gait, Cardiopulmonary status limiting activity, Decreased activity tolerance, Decreased balance, Decreased mobility, Decreased range of motion, Decreased strength, Difficulty walking, Pain  Visit Diagnosis: Pain in right hip  Difficulty in walking, not elsewhere classified     Problem List Patient Active Problem List   Diagnosis Date Noted   Radiculopathy, lumbar region 12/08/2020   Status post left hip replacement 07/22/2020   Status post right hip replacement 05/02/2020   Status post total replacement of right hip 04/15/2020   Avascular necrosis of bone of left hip (HCC) 02/10/2020   Avascular necrosis of bone of right hip (HCC) 02/10/2020   Allergic rhinitis 05/21/2019   Chronic asthmatic bronchitis (HCC) 04/30/2019   Chronic respiratory failure with hypoxia (HCC) 04/30/2019   Spinal stenosis of cervical region 04/05/2017   Partial tear of right subscapularis tendon    Incisional hernia, without obstruction or gangrene    Urinary frequency 03/21/2012   Migraine equivalent syndrome 03/21/2012   Insomnia 03/21/2012   Lurena Nida, PTA/CLT [phone]  Lurena Nida, PTA 12/30/2020, 9:31 AM  [facility] [address] [place], [place], [address] Phone: [phone]   Fax:  [phone]  Name: [name] MRN: [id number] Date of Birth: 1966/02/08

## 2020-12-30 NOTE — Progress Notes (Signed)
@Patient  ID: , female    DOB: Sep 29, 1965     MRN: [id number]    Referring provider: [id number], MD  Brief patient profile:  79 yowf  Quit smoking 2009  previously  followed for COPD GOLD II (but really more of an AB /eosinophilic phenotype with pfts nearly nl p saba), dx of chronic hypoxic respiratory failure on oxygen at 4 L and chronic rhinitis Medical history significant for GERD  Tension Pneumothorax 12/2001 s/p VATS w/ mechanical pleurodesis on right -Dr. 01/2002    TEST/EVENTS :  IgE 05/27/2018   3590 with Eosinophil count 800/mcL > 05/29/2018   07/29/2019 Follow-up : COPD, oxygen dependent respiratory failure, asthma, chronic rhinitis Patient returns for a 47-month follow-up.  Patient has underlying moderate COPD with allergic asthma (eosinophilic phenotype).  Last visit she continued to have increased symptom burden.  She was continued on DuoNeb 4 times daily.  Budesonide twice daily.  Last visit she was started on prednisone 10 mg daily .  She says she does feel that this has helped with her breathing.  She still gets very winded with minimal activity.  Patient had PFTs show stable lung function since 2019.  Patient has moderate airflow obstruction and restriction with FEV1 at 62%, ratio 66, FVC 74%, significant bronchodilator response with positive reversibility, 27% change, significant mid flow reversibility (137% change), DLCO improved at 65%. Patient remains on oxygen at 4 L.  Previous chest x-ray November 2020 showed clear lungs.  He denies any increased cough congestion or wheezing.  Patient is on disability which was approved and March 2021 unfortunately she does not have insurance.  She can afford her current nebulizer regimen. Is her medications through the good Rx rec Continue on Duoneb Four times a day   Continue on Budesonide Neb Twice daily   Continue on Prednisone 10mg  daily  Low sweet and salt diet  Activity as tolerated.  Continue on Oxygen on  4l/m    11/17/2019  Consultaton as 2nd opinion/[place] office/Juanya Villavicencio re: AB/ 02 dep resp failure  Chief Complaint  Patient presents with   Consult    Shortness of breath with exertion,   Dyspnea:  Doesn't do grocery shopping x sev years  [place] time walked was park [place] "before got  Hot" on 4lpm and sats 93% she reports while walking  Cough: esp in am / gray  Sleeping: ativan at hs / on side bed is flat/ 2 pillows / 3 x weekly wakes up feeling need for more saba SABA use: "all the time"  02: 4lpm 24/7 though POC at 4 pulsed outside home  Prednisone since jan 2021 but out x sev weeks and much worse even on bud neb rec Make sure you check your oxygen saturations at highest level of activity to be sure it stays over 90% and adjust upward to maintain this level if needed but remember to turn it back to previous settings when you stop (to conserve your supply).  Stop budesonide and duoneb  Plan A = Automatic = Always=    Stiolto 2 puffs first each am  Prednisone 10 mg 2 until better then 1 daily  Singulair 10 mg each pm  Plan B = Backup (to supplement plan A, not to replace it) Only use your albuterol inhaler as a rescue medication  Plan C = Crisis (instead of Plan B but only if Plan B stops working) - only use your albuterol nebulizer if you first try Plan B and  it fails to help > ok to use the nebulizer up to every 4 hours but if start needing it regularly call for immediate appointment   01/13/2020  f/u ov/[place] office/Aerial Dilley re: AB / stiolto and pred 10 mg daily  Chief Complaint  Patient presents with   Follow-up    shortness of breath with activity  Dyspnea: limited by R > L hip not sob Cough: minimal > mucoid Sleeping: bed is flat several pillows  SABA use: less ventolin but less active / rarely neb  02: 4lpm 24/7  Likes stiolto but can't afford on her insurance and has also tried Theme park [employer] trelegy and breztri due to either cost or upper airway side effects rec When you  get medicare, strongly advise you to get medicare with commercial supplement  Plan A = Automatic = Always=   General  symbicort 80 Take 2 puffs first thing in am and then another 2 puffs about 12 hours later.  (or stiolto 2 puffs each am but not both)  Work on inhaler technique: Ok to go back to [employer]  like you were before as your maintenance  Plan B = Backup (to supplement plan A, not to replace it) Only use your albuterol inhaler as a rescue medication to be used if you can't catch your breath by resting or doing a relaxed purse lip breathing pattern.  - The less you use it, the better it will work when you need it. - Ok to use the inhaler up to 2 puffs  every 4 hours if you must but call for appointment if use goes up over your usual need - Don't leave home without it !!  (think of it like the spare tire for your car)  Plan C = Crisis (instead of Plan B but only if Plan B stops working) - only use your albuterol nebulizer if you first try Plan B and it fails to help > ok to use the nebulizer up to every 4 hours but if start needing it regularly call for immediate appointment Plan D = Deltasone If doing worse despite ABC then try taking prednisone 2 until better then hold at 10 mg daily for a week then try 5mg        03/07/2020  f/u ov/[place] office/Katherinne Mofield re: AB stiolto /pred 10 mg one whole, never able to try one half  Chief Complaint  Patient presents with   Follow-up    Patient needs surgical clearance for hip surgery, wears 4 liters oxygen all the time. Wants samples of stiolto   Dyspnea: stops walking walking 50 ft hip > breathing stops her Cough: none Sleeping: bed is flat, neck pillow  SABA use: hfa sev times a week/ neb less but last 03/05/20  02: 4lpm not checking sats   rec Symbicort or stiolto daily but not both Work on inhaler technique: When breathing is bad take prednisone 20 mg daily and 10 mg daily otherwise - take all of it first thing in am  Please  remember to go to the lab department @ [facility] for your tests - we will call you with the results when they are available.    06/02/2020  f/u ov/[place] office/Kristy Catoe re: AB stiolto samples plus pred 10 mg daily as can't afford nucala which is what Dr 06/04/2020 rec  Chief Complaint  Patient presents with   Follow-up    Breathing is overall doing well. She states that her cough has been "raspy"- prod in the am  with clear to yellow sputum.  She is using her albuterol inhaler 4-5 x per wk and she has not used neb.   Dyspnea:  Just started back walking p Apr 15 2020 THR  R did fine and planning L side next  Cough: usual am cough  Sleeping: flat bed neck pillow does fine  SABA use: as above  02: 3 lpm 24/7  Covid status: vax x 3  Lung cancer screening: n/a Rec No change on medications  I would Dr Ellouise Newer office to ask what insurance works the best for [employer] or whatever they recommend you start on  Please schedule a follow up visit in 3 months but call sooner if needed    12/30/2020  f/u ov/[place] office/Deandre Stansel re: AB maint on stiolto 2 in am/ fasenra started 12/26/20 and pred at 10 mg daily   Chief Complaint  Patient presents with   Follow-up    3L O2 all of the time including while sleeping. SOB is mainly with exertion. No cough to report.    Dyspnea:  doing PT / walking treadmill x 1.5 mph x 15 min x flat with sats 85 % but not titrating as rec  Cough: minimal in am > clear mucus Sleeping: bed is flat/ 2 pillow on side does fine SABA use: maybe once a day  02: 3lpm cont and 3lpm POC Covid status: vax x 3      No obvious day to day or daytime variability or assoc excess/ purulent sputum or mucus plugs or hemoptysis or cp or chest tightness, subjective wheeze or overt sinus or hb symptoms.   Sleeping as above without nocturnal  or early am exacerbation  of respiratory  c/o's or need for noct saba. Also denies any obvious fluctuation of symptoms with weather or  environmental changes or other aggravating or alleviating factors except as outlined above   No unusual exposure hx or h/o childhood pna/ asthma or knowledge of premature birth.  Current Allergies, Complete Past Medical History, Past Surgical History, Family History, and Social History were reviewed in [employer] record.  ROS  The following are not active complaints unless bolded Hoarseness, sore throat, dysphagia, dental problems, itching, sneezing,  nasal congestion or discharge of excess mucus or purulent secretions, ear ache,   fever, chills, sweats, unintended wt loss or wt gain, classically pleuritic or exertional cp,  orthopnea pnd or arm/hand swelling  or leg swelling, presyncope, palpitations, abdominal pain, anorexia, nausea, vomiting, diarrhea  or change in bowel habits or change in bladder habits, change in stools or change in urine, dysuria, hematuria,  rash, arthralgias, visual complaints, headache, numbness, weakness or ataxia or problems with walking or coordination,  change in mood or  memory.        Current Meds  Medication Sig   acetaminophen (TYLENOL) 500 MG tablet Take 1,000 mg by mouth every 6 (six) hours as needed for moderate pain.   acetaminophen-codeine (TYLENOL #3) 300-30 MG tablet Take 1-2 tablets by mouth every 4 (four) hours as needed for moderate pain.   albuterol (VENTOLIN HFA) 108 (90 Base) MCG/ACT inhaler Inhale 2 puffs into the lungs every 4 (four) hours as needed for shortness of breath.   aspirin-acetaminophen-caffeine (EXCEDRIN MIGRAINE) 250-250-65 MG tablet Take 2 tablets by mouth every 8 (eight) hours as needed for migraine or headache.   calcium carbonate (TUMS EX) 750 MG chewable tablet Chew 2 tablets by mouth daily as needed for heartburn.   cetirizine (ZYRTEC) 10 MG tablet  Take 10 mg by mouth daily.   Cholecalciferol (VITAMIN D3 SUPER STRENGTH) 50 MCG (2000 UT) CAPS Take 2,000 Units by mouth daily.   citalopram (CELEXA) 40 MG  tablet Take 40 mg by mouth at bedtime.   docusate sodium (COLACE) 100 MG capsule Take 100-200 mg by mouth daily as needed for mild constipation.   famotidine (PEPCID) 20 MG tablet Take 20 mg by mouth daily as needed for heartburn or indigestion.   gabapentin (NEURONTIN) 100 MG capsule Take 2 capsules (200 mg total) by mouth 3 (three) times daily.   Lancets (ONETOUCH DELICA PLUS LANCET33G) MISC Apply topically.   levothyroxine (SYNTHROID) 75 MCG tablet Take 0.5 tablets (37.5 mcg total) by mouth daily before breakfast.   LORazepam (ATIVAN) 1 MG tablet Take 1 mg by mouth See admin instructions. Take 1 mg at bedtime, may take a second 1 mg dose during the day as needed for anxiety   methocarbamol (ROBAXIN) 500 MG tablet Take 1 tablet (500 mg total) by mouth 3 (three) times daily.   montelukast (SINGULAIR) 10 MG tablet TAKE 1 TABLET BY MOUTH AT BEDTIME   Multiple Vitamin (MULTIVITAMIN) tablet Take 1 tablet by mouth daily.   pantoprazole (PROTONIX) 40 MG tablet Take 30- 60 min before your first and last meals of the day (Patient taking differently: Take 40 mg by mouth 2 (two) times daily. Take 30- 60 min before your first and last meals of the day)   pravastatin (PRAVACHOL) 40 MG tablet Take 40 mg by mouth at bedtime.   predniSONE (DELTASONE) 10 MG tablet Take 1 tablet by mouth once daily   SUMAtriptan 6 MG/0.5ML SOAJ Inject 6 mg as directed daily as needed (migraine).    Tiotropium Bromide-Olodaterol (STIOLTO RESPIMAT) 2.5-2.5 MCG/ACT AERS Inhale 2 puffs into the lungs daily.   Triamcinolone Acetonide (NASACORT ALLERGY 24HR NA) Place 2 sprays into the nose daily as needed (allergies).   valACYclovir (VALTREX) 500 MG tablet Take 500-2,000 mg by mouth See admin instructions. Take 2000mg s at first sign of fever blister outbreak then take 500mg s daily until gone                           Past Medical History:  Diagnosis Date   Anxiety    Asthma    Cervical radiculopathy    Cluster headaches     COPD (chronic obstructive pulmonary disease) (HCC)    COPD (chronic obstructive pulmonary disease) with chronic bronchitis (HCC)    GERD (gastroesophageal reflux disease)    Hyperlipemia    Hypoglycemia    Hypothyroidism    Migraine    Pneumothorax    Rectal discharge 07/28/2010   Shortness of breath    Sleep apnea    cannot tolerate-not using CPAP   Sluggishness 07/28/2010        Physical Exam  12/30/2020       148  06/02/2020       149  03/07/2020     144  01/13/2020       145   11/17/19 146 lb 12.8 oz (66.6 kg)  09/15/19 149 lb 6.4 oz (67.8 kg)  07/29/19 151 lb (68.5 kg)      Vital signs reviewed  12/30/2020  - Note at rest 02 sats  97% on RA   General appearance:    amb wf nad   HEENT : pt wearing mask not removed for exam due to covid - 19 concerns.   NECK :  without JVD/Nodes/TM/ nl carotid upstrokes bilaterally   LUNGS: no acc muscle use,  Min barrel  contour chest wall with bilateral  slightly decreased bs s audible wheeze and  without cough on insp or exp maneuvers and min  Hyperresonant  to  percussion bilaterally     CV:  RRR  no s3 or murmur or increase in P2, and no edema   ABD:  soft and nontender with pos end  insp Hoover's  in the supine position. No bruits or organomegaly appreciated, bowel sounds nl  MS:   Nl gait/  ext warm without deformities, calf tenderness, cyanosis or clubbing No obvious joint restrictions   SKIN: warm and dry without lesions    NEURO:  alert, approp, nl sensorium with  no motor or cerebellar deficits apparent.                 Assessment & Plan:

## 2020-12-30 NOTE — Patient Instructions (Addendum)
Make sure you check your oxygen saturation  at your highest level of activity  to be sure it stays over 90% and adjust  02 flow upward to maintain this level if needed but remember to turn it back to previous settings when you stop (to conserve your supply).    Prednisone "ceiling" for now is 20 mg until better, then 10 mg daily is the floor but the floor should be able to be reduced as you respond to fosenra    Please schedule a follow up visit in 6 months but call sooner if needed

## 2020-12-31 ENCOUNTER — Encounter: Payer: Self-pay | Admitting: Internal Medicine

## 2020-12-31 NOTE — Assessment & Plan Note (Addendum)
Quit smoking 2009 / steroid dep since Jan 2021  - IgE 05/27/2018   3590 with Eosinophil count 800/mcL > [place]  - PFT's 07/29/2019  FEV1 2.25 (79 % ) ratio 0.70  p 27 % improvement from saba p 0 prior to study with DLCO  14.30 (65%) corrects to 3.87 (91%)  for alv volume and FV curve min curvature   - 11/17/2019    try stiolto x 6 weeks and pred 20  Until better then 10 mg daily plus maint singulair  - Labs ordered 03/07/2020   alpha one AT phenotype  MM - 03/06/20 IgE  1115  - 12/26/20 started Fasenra -12/30/2020  After extensive coaching inhaler device,  effectiveness =    90% with smi > continue stiolto with pred ceiling 20 and floor 10 mg    should be able to wean prednisone down if not off once on fasenra or even consider cahnge stiolto to symbicort and expedite the taper but she's been on pred so long there is a risk of adrenal insuff from longterm HPA suppression/ advised  Re SABA :  I spent extra time with pt today reviewing appropriate use of albuterol for prn use on exertion with the following points: 1) saba is for relief of sob that does not improve by walking a slower pace or resting but rather if the pt does not improve after trying this first. 2) If the pt is convinced, as many are, that saba helps recover from activity faster then it's easy to tell if this is the case by re-challenging : ie stop, take the inhaler, then p 5 minutes try the exact same activity (intensity of workload) that just caused the symptoms and see if they are substantially diminished or not after saba 3) if there is an activity that reproducibly causes the symptoms, try the saba 15 min before the activity on alternate days   If in fact the saba really does help, then fine to continue to use it prn but advised may need to look closer at the maintenance regimen being used to achieve better control of airways disease with exertion.

## 2020-12-31 NOTE — Assessment & Plan Note (Addendum)
11/17/2019   Walked 4lpm her POC  approx   300 ft  @ slow/[place] gate  stopped due to  Sob and R hip and leg pain with sats still 98%     As of 12/30/2020  rec 3lpm 24/7 though ok to tritrate on portable 02 to sats >  90%          Each maintenance medication was reviewed in detail including emphasizing most importantly the difference between maintenance and prns and under what circumstances the prns are to be triggered using an action plan format where appropriate.  Total time for H and P, chart review, counseling, reviewing hfa/02/smi  device(s) and generating customized AVS unique to this office visit / same day charting = 32 min

## 2021-01-02 ENCOUNTER — Other Ambulatory Visit: Payer: Self-pay

## 2021-01-02 ENCOUNTER — Ambulatory Visit (HOSPITAL_COMMUNITY): Payer: Medicare Other | Admitting: Physical Therapy

## 2021-01-02 DIAGNOSIS — R262 Difficulty in walking, not elsewhere classified: Secondary | ICD-10-CM

## 2021-01-02 DIAGNOSIS — M25551 Pain in right hip: Secondary | ICD-10-CM

## 2021-01-02 NOTE — Telephone Encounter (Signed)
Surgery sheet received. Emailed Judeth Cornfield, [employer] rep, patient requesting 10/27 for surgery date.

## 2021-01-02 NOTE — Therapy (Cosign Needed Addendum)
[facility] [address] [place], [place], [address] Phone: [phone]   Fax:  [phone]  Physical Therapy Treatment Progress Note Reporting Period 12/19/2020 to 09/262022  See note below for Objective Data and Assessment of Progress/Goals.     Patient Details  Name: [name] MRN: [id number] Date of Birth: 12-07-1965 Referring Provider (PT): Erskine Emery   Encounter Date: 01/02/2021   PT End of Session - 01/02/21 1130     Visit Number 10    Number of Visits 12    Date for PT Re-Evaluation 01/07/21    Authorization Type BCBS medicare    Progress Note Due on Visit 20    PT Start Time 1004    PT Stop Time 1044    PT Time Calculation (min) 40 min    Activity Tolerance Patient tolerated treatment well    Behavior During Therapy WFL for tasks assessed/performed             Past Medical History:  Diagnosis Date   Anemia    Anxiety    Arthritis    Asthma    Cervical radiculopathy    Cluster headaches    COPD (chronic obstructive pulmonary disease) (HCC)    COPD (chronic obstructive pulmonary disease) with chronic bronchitis (HCC)    GERD (gastroesophageal reflux disease)    Hyperlipemia    Hypoglycemia    Hypothyroidism    Migraine    Pneumothorax    Pre-diabetes    Rectal discharge 07/28/2010   Shortness of breath    Sleep apnea    cannot tolerate-not using CPAP   Sluggishness 07/28/2010    Past Surgical History:  Procedure Laterality Date   ABDOMINAL HYSTERECTOMY     with bladder suspension   CERVICAL FUSION     CHOLECYSTECTOMY     COLONOSCOPY N/A 07/30/2013   Procedure: COLONOSCOPY;  Surgeon: Rogene Houston, MD;  [facility];  Service: Endoscopy;  Laterality: N/A;  [place] OF SHOULDER Right 10/11/2016   Procedure: EXAM UNDER ANESTHESIA WITH MANIPULATION OF SHOULDER;  Surgeon: Carole Civil, MD;  Location: [facility];  Service: Orthopedics;   Laterality: Right;   HERNIA REPAIR     INCISIONAL HERNIA REPAIR N/A 07/30/2016   Procedure: HERNIA REPAIR INCISIONAL WITH MESH;  Surgeon: Aviva Signs, MD;  Location: [facility];  Service: General;  Laterality: N/A;   LOBECTOMY     right lung    POSTERIOR CERVICAL LAMINECTOMY Right 04/05/2017   Procedure: Right C6-7 Posterior cervical laminectomy;  Surgeon: Kary Kos, MD;  Location: [facility];  Service: Neurosurgery;  Laterality: Right;  Right C6-7 Posterior cervical laminectomy   SHOULDER ARTHROSCOPY WITH ROTATOR CUFF REPAIR Right 10/11/2016   Procedure: SHOULDER ARTHROSCOPY;  Surgeon: Carole Civil, MD;  [facility];  Service: Orthopedics;  Laterality: Right;   TOTAL HIP ARTHROPLASTY Right 04/15/2020   Procedure: RIGHT TOTAL HIP ARTHROPLASTY ANTERIOR APPROACH;  Surgeon: Mcarthur Rossetti, MD;  [facility];  Service: Orthopedics;  Laterality: Right;   TOTAL HIP ARTHROPLASTY Left 07/22/2020   Procedure: LEFT  HIP ARTHROPLASTY ANTERIOR APPROACH;  Surgeon: Mcarthur Rossetti, MD;  [facility];  Service: Orthopedics;  Laterality: Left;  RNFA   TUBAL LIGATION      There were no vitals filed for this visit.   Subjective Assessment - 01/02/21 1006     Subjective Pt states she returns to MD tomorrow.  Feels she's improved between 50-75%.  Currently pain 4/10 pain in lower back and top of Rt hip.    Currently in Pain? No/denies    Pain Score 4     Pain Location Back    Pain Orientation Right;Lower    Pain Descriptors / Indicators Aching                [facility] PT Assessment - 01/02/21 1009       Assessment   Medical Diagnosis Lumbar radiculopathy    Referring Provider (PT) Erskine Emery    Onset Date/Surgical Date 11/13/20    Next MD Visit not scheduled    Prior Therapy none      Precautions   Precautions Anterior Hip   B     Restrictions   Weight Bearing Restrictions No      Home Environment   Living Environment Private residence      Prior Function    Level of Independence Independent    Vocation On disability    Leisure Walk      Cognition   Overall Cognitive Status Within Functional Limits for tasks assessed      Observation/Other Assessments   Focus on Therapeutic Outcomes (FOTO)  functional status: hip 62.6% (was 31%) Lumbar 44.7% (was 36.4%)      Functional Tests   Functional tests Sit to Stand;Single leg stance      Single Leg Stance   Comments Rt 7;Lt 20"   was R: 0, Lt: 5"     Sit to Stand   Comments can stand with extreme fwd bent posturing and increased time 1X without UE   was unable to rise without UE assist     Strength   Right Hip Flexion 4-/5   was 2+ on 9/12   Right Hip Extension 4/5   was 2 on 9/12   Right Hip ABduction 4/5   was 3+ on 9/12   Left Hip Flexion 5/5   was 3+ on 9/12   Left Hip Extension 3-/5   was 3- on 9/12   Left Hip ABduction 5/5    Right Knee Flexion 4+/5   was 4 on 9/12   Right Knee Extension 4+/5   was 3- on 9/12   Left Knee Flexion 5/5   was 4+ on 9/12   Left Knee Extension 5/5   was 5 on 9/12   Right Ankle Dorsiflexion 4+/5   was 3+ on 9/12   Left Ankle Dorsiflexion 5/5   was 4 on 9/12     Ambulation/Gait   Ambulation Distance (Feet) 250 Feet   was 113 foot with SPC   Assistive device Straight cane   and O 2   Gait Pattern Decreased arm swing - right;Decreased step length - right;Decreased dorsiflexion - right;Decreased dorsiflexion - left    Gait Comments 2 MWT                                      PT Short Term Goals - 01/02/21 1126       PT SHORT TERM GOAL #1   Title Pt to be completing HEP to decrease radicular sx to buttock only    Time 2    Period Weeks    Status Achieved    Target Date 12/22/20      PT SHORT TERM GOAL #2   Title Pt core and LE strength to be improved to allow pt to be  able to come sit to stand from a firm, higher chair without UE assist    Baseline 9/26: can complete but not in good form and with increased time    Time 2     Period Weeks    Status Partially Met               PT Long Term Goals - 01/02/21 1127       PT LONG TERM GOAL #1   Title PT to have an advance HEP to be able to erradicate radicular sx while walking.    Time 4    Period Weeks    Status On-going      PT LONG TERM GOAL #2   Title PT core and LE strength to be increased one grade to allow pt to be able to come sit to stand from a couch without UE assist    Baseline 9/26:  All mm with increase of 1/2 grade to 1 grade; completed one rep of standing without UE but with extreme fwd bent posturing and increased time.    Time 4    Period Weeks    Status Partially Met      PT LONG TERM GOAL #3   Title PT to be able to single leg stance on both LE for at least 10 seconds to decrease risk of falling    Baseline 9/26:  7" max on RT, 20" on Lt    Time 4    Period Weeks    Status Partially Met      PT LONG TERM GOAL #4   Title PT Rt hip and leg pain to be decreased to no greater than a 3/10 to allow pt to be able to walk 226 ft in a 2 minutes using her cane and O2    Baseline 9/26:  Pain is still 4/10 but able to complete 250 feet today in 2 minutes with SPC and O2    Time 4    Period Weeks    Status Partially Met                   Plan - 01/02/21 1220     Clinical Impression Statement 10th visit progress note completed this session.  Pt has made great improvements in strength and function as noted by test measures.  All muscle grades increased -2 grades in some areas. Pt has met 1 STG and  progressing or partly met all other goals.  Pt overall doing well and will benefit from continued skilled physical therapy.    Personal Factors and Comorbidities Comorbidity 3+;Fitness    Comorbidities B THR, COPD< cervical surgery, anxiety, depression    Examination-Activity Limitations Bed Mobility;Caring for Others;Carry;Dressing;Lift;Locomotion Level;Stand;Stairs;Squat;Sit    Examination-Participation Restrictions Cleaning;Shop     Stability/Clinical Decision Making Evolving/Moderate complexity    Rehab Potential Fair    PT Frequency 3x / week    PT Duration 4 weeks    PT Treatment/Interventions Gait training;Stair training;Therapeutic exercise;Patient/family education;Manual techniques    PT Next Visit Plan continue improving gait mechanics, spinal mobility and stabilization.    PT Home Exercise Plan t band resisted dorsiflexion B, knee extension B, hip abduction B, hip adduction B; 9/6: bridge and SLR; 12/15/20: clam and abd  9/23: vectors    Consulted and Agree with Plan of Care Patient             Patient will benefit from skilled therapeutic intervention in order to improve the following deficits and  impairments:  Abnormal gait, Cardiopulmonary status limiting activity, Decreased activity tolerance, Decreased balance, Decreased mobility, Decreased range of motion, Decreased strength, Difficulty walking, Pain  Visit Diagnosis: Pain in right hip  Difficulty in walking, not elsewhere classified     Problem List Patient Active Problem List   Diagnosis Date Noted   Radiculopathy, lumbar region 12/08/2020   Status post left hip replacement 07/22/2020   Status post right hip replacement 05/02/2020   Status post total replacement of right hip 04/15/2020   Avascular necrosis of bone of left hip ([place]) 02/10/2020   Avascular necrosis of bone of right hip ([place]) 02/10/2020   Allergic rhinitis 05/21/2019   Chronic asthmatic bronchitis ([place]) 04/30/2019   Chronic respiratory failure with hypoxia ([place]) 04/30/2019   Spinal stenosis of cervical region 04/05/2017   Partial tear of right subscapularis tendon    Incisional hernia, without obstruction or gangrene    Urinary frequency 03/21/2012   Migraine equivalent syndrome 03/21/2012   Insomnia 03/21/2012   Teena Irani, PTA/CLT [place], PT CLT [phone]  01/02/2021, 12:21 PM  [facility] [address] [place], [place], [address] Phone: [phone]   Fax:  [phone]  Name: [name] MRN: [id number] Date of Birth: 02/05/1966

## 2021-01-03 ENCOUNTER — Ambulatory Visit: Payer: Medicare Other | Admitting: Orthopaedic Surgery

## 2021-01-03 ENCOUNTER — Ambulatory Visit: Payer: Self-pay

## 2021-01-03 ENCOUNTER — Encounter: Payer: Self-pay | Admitting: Orthopaedic Surgery

## 2021-01-03 DIAGNOSIS — G8929 Other chronic pain: Secondary | ICD-10-CM

## 2021-01-03 DIAGNOSIS — Z96642 Presence of left artificial hip joint: Secondary | ICD-10-CM

## 2021-01-03 DIAGNOSIS — M5441 Lumbago with sciatica, right side: Secondary | ICD-10-CM | POA: Diagnosis not present

## 2021-01-03 NOTE — Addendum Note (Signed)
Addended by: Fredda Hammed L on: 01/03/2021 10:30 AM   Modules accepted: Orders

## 2021-01-03 NOTE — Progress Notes (Signed)
The patient comes in today to see how she is doing status post physical therapy on her lumbar spine.  She was having significant radicular symptoms going down both legs.  She said the left leg is improved 100% of the right leg is only 75%.  She still has bee stings and numbness and tingling going down her right thigh and to her right leg.  We have replaced both of her hips.  The right hip was replaced in January of this year and the left hip in April of this year.  She is on oxygen.  She says the hip replacements are doing well and she is having no groin pain.  Both hips move smoothly and fluidly.  She still has subjective numbness going down her right thigh into her right leg.  An [facility] pelvis shows bilateral total hip arthroplasties with no complicating features.  Given her continued right-sided radicular symptoms, a MRI of the lumbar spine is warranted at this standpoint to rule out nerve impingement and stenosis given the continued numbness and tingling and irritation she is experiencing on the right side.  She will continue her physical therapy for 1 more visit and transition to home exercise program.  We will see her back in follow-up after the MRI of her lumbar spine.

## 2021-01-04 ENCOUNTER — Other Ambulatory Visit: Payer: Self-pay

## 2021-01-04 ENCOUNTER — Ambulatory Visit (HOSPITAL_COMMUNITY): Payer: Medicare Other | Admitting: Physical Therapy

## 2021-01-04 DIAGNOSIS — R262 Difficulty in walking, not elsewhere classified: Secondary | ICD-10-CM | POA: Diagnosis not present

## 2021-01-04 DIAGNOSIS — M25551 Pain in right hip: Secondary | ICD-10-CM

## 2021-01-04 NOTE — Therapy (Signed)
[facility] [place], [place], [address] Phone: [phone]   Fax:  [phone]  Physical Therapy Treatment  Patient Details  Name: [name] MRN: [id number] Date of Birth: [birth year] Referring Provider (PT): Erskine Emery   Encounter Date: 01/04/2021   PT End of Session - 01/04/21 1123     Visit Number 11    Number of Visits 12    Date for PT Re-Evaluation 01/07/21    Authorization Type BCBS medicare    Progress Note Due on Visit 20    PT Start Time 1052    PT Stop Time 1133    PT Time Calculation (min) 41 min    Activity Tolerance Patient tolerated treatment well    Behavior During Therapy [facility] for tasks assessed/performed             Past Medical History:  Diagnosis Date   Anemia    Anxiety    Arthritis    Asthma    Cervical radiculopathy    Cluster headaches    COPD (chronic obstructive pulmonary disease) (HCC)    COPD (chronic obstructive pulmonary disease) with chronic bronchitis (HCC)    GERD (gastroesophageal reflux disease)    Hyperlipemia    Hypoglycemia    Hypothyroidism    Migraine    Pneumothorax    Pre-diabetes    Rectal discharge 07/28/2010   Shortness of breath    Sleep apnea    cannot tolerate-not using CPAP   Sluggishness 07/28/2010    Past Surgical History:  Procedure Laterality Date   ABDOMINAL HYSTERECTOMY     with bladder suspension   CERVICAL FUSION     CHOLECYSTECTOMY     COLONOSCOPY N/A 07/30/2013   Procedure: COLONOSCOPY;  Surgeon: Rogene Houston, MD;  [facility];  Service: Endoscopy;  Laterality: N/A;  [place] OF SHOULDER Right 10/11/2016   Procedure: EXAM UNDER ANESTHESIA WITH MANIPULATION OF SHOULDER;  Surgeon: Carole Civil, MD;  [facility];  Service: Orthopedics;  Laterality: Right;   HERNIA REPAIR     INCISIONAL HERNIA REPAIR N/A 07/30/2016   Procedure: HERNIA REPAIR INCISIONAL WITH MESH;   Surgeon: Aviva Signs, MD;  [facility];  Service: General;  Laterality: N/A;   LOBECTOMY     right lung    POSTERIOR CERVICAL LAMINECTOMY Right 04/05/2017   Procedure: Right C6-7 Posterior cervical laminectomy;  Surgeon: Kary Kos, MD;  Location: [facility];  Service: Neurosurgery;  Laterality: Right;  Right C6-7 Posterior cervical laminectomy   SHOULDER ARTHROSCOPY WITH ROTATOR CUFF REPAIR Right 10/11/2016   Procedure: SHOULDER ARTHROSCOPY;  Surgeon: Carole Civil, MD;  [facility];  Service: Orthopedics;  Laterality: Right;   TOTAL HIP ARTHROPLASTY Right 04/15/2020   Procedure: RIGHT TOTAL HIP ARTHROPLASTY ANTERIOR APPROACH;  Surgeon: Mcarthur Rossetti, MD;  [facility];  Service: Orthopedics;  Laterality: Right;   TOTAL HIP ARTHROPLASTY Left 07/22/2020   Procedure: LEFT  HIP ARTHROPLASTY ANTERIOR APPROACH;  Surgeon: Mcarthur Rossetti, MD;  [facility];  Service: Orthopedics;  Laterality: Left;  RNFA   TUBAL LIGATION      There were no vitals filed for this visit.   Subjective Assessment - 01/04/21 1117     Subjective Pt states that her MD was very pleased.  Her main problem at this time is her Rt SI jt.  Her pain tends to vary depending on her activity leve.  Pertinent History B THR,  COPD with O2 dependency, migranes and cervical surgery    Limitations Sitting;Lifting;Standing;Walking;House hold activities    How long can you sit comfortably? 45 was 15    How long can you stand comfortably? 45 minutes was less than five    How long can you walk comfortably? with cane and oxygen:  tends to get out of breath after 5-10 minuts    Patient Stated Goals less pain walk better    Currently in Pain? Yes    Pain Score 4     Pain Location Hip    Pain Orientation Right;Posterior;Lower    Pain Descriptors / Indicators Aching    Pain Onset 1 to 4 weeks ago    Pain Frequency Constant    Aggravating Factors  WB    Pain Relieving Factors meds                                [facility] Adult PT Treatment/Exercise - 01/04/21 0001       Lumbar Exercises: Stretches   Active Hamstring Stretch Right;Left;3 reps;30 seconds    Quad Stretch Left;Right;2 reps;60 seconds      Lumbar Exercises: Machines for Strengthening   Leg Press 3 PL 2x10      Lumbar Exercises: Supine   Bridge 20 reps;5 seconds    Other Supine Lumbar Exercises isometric hip ab/adduction x 15      Lumbar Exercises: Prone   Straight Leg Raise 15 reps      Manual Therapy   Manual Therapy Soft tissue mobilization;Muscle Energy Technique    Manual therapy comments m all other aspects of treatment    Soft tissue mobilization to reduce pain    Muscle Energy Technique to correct SI alignment                       PT Short Term Goals - 01/02/21 1126       PT SHORT TERM GOAL #1   Title Pt to be completing HEP to decrease radicular sx to buttock only    Time 2    Period Weeks    Status Achieved    Target Date 12/22/20      PT SHORT TERM GOAL #2   Title Pt core and LE strength to be improved to allow pt to be able to come sit to stand from a firm, higher chair without UE assist    Baseline 9/26: can complete but not in good form and with increased time    Time 2    Period Weeks    Status Partially Met               PT Long Term Goals - 01/02/21 1127       PT LONG TERM GOAL #1   Title PT to have an advance HEP to be able to erradicate radicular sx while walking.    Time 4    Period Weeks    Status On-going      PT LONG TERM GOAL #2   Title PT core and LE strength to be increased one grade to allow pt to be able to come sit to stand from a couch without UE assist    Baseline 9/26:  All mm with increase of 1/2 grade to 1 grade; completed one rep of standing without UE but with extreme fwd bent posturing and increased time.  Time 4    Period Weeks    Status Partially Met      PT LONG TERM GOAL #3   Title PT to be able to single  leg stance on both LE for at least 10 seconds to decrease risk of falling    Baseline 9/26:  7" max on RT, 20" on Lt    Time 4    Period Weeks    Status Partially Met      PT LONG TERM GOAL #4   Title PT Rt hip and leg pain to be decreased to no greater than a 3/10 to allow pt to be able to walk 226 ft in a 2 minutes using her cane and O2    Baseline 9/26:  Pain is still 4/10 but able to complete 250 feet today in 2 minutes with SPC and O2    Time 4    Period Weeks    Status Partially Met                   Plan - 01/04/21 1123     Clinical Impression Statement Pt states  her main concern is the constant pain in her RT lower back pointing to SI area.  Therapist examined area noting SI mal alignment.  Completed contract relax techniques and improved but did not totally correct alignment.    Personal Factors and Comorbidities Comorbidity 3+;Fitness    Comorbidities B THR, COPD< cervical surgery, anxiety, depression    Examination-Activity Limitations Bed Mobility;Caring for Others;Carry;Dressing;Lift;Locomotion Level;Stand;Stairs;Squat;Sit    Examination-Participation Restrictions Cleaning;Shop    Stability/Clinical Decision Making Evolving/Moderate complexity    Rehab Potential Fair    PT Frequency 3x / week    PT Duration 4 weeks    PT Treatment/Interventions Gait training;Stair training;Therapeutic exercise;Patient/family education;Manual techniques    PT Next Visit Plan check SI ,  review HEP in preparation for D/C    PT Home Exercise Plan t band resisted dorsiflexion B, knee extension B, hip abduction B, hip adduction B; 9/6: bridge and SLR; 12/15/20: clam and abd  9/23: vectors    Consulted and Agree with Plan of Care Patient             Patient will benefit from skilled therapeutic intervention in order to improve the following deficits and impairments:  Abnormal gait, Cardiopulmonary status limiting activity, Decreased activity tolerance, Decreased balance, Decreased  mobility, Decreased range of motion, Decreased strength, Difficulty walking, Pain  Visit Diagnosis: Pain in right hip  Difficulty in walking, not elsewhere classified     Problem List Patient Active Problem List   Diagnosis Date Noted   Radiculopathy, lumbar region 12/08/2020   Status post left hip replacement 07/22/2020   Status post right hip replacement 05/02/2020   Status post total replacement of right hip 04/15/2020   Avascular necrosis of bone of left hip ([place]) 02/10/2020   Avascular necrosis of bone of right hip ([place]) 02/10/2020   Allergic rhinitis 05/21/2019   Chronic asthmatic bronchitis ([place]) 04/30/2019   Chronic respiratory failure with hypoxia ([place]) 04/30/2019   Spinal stenosis of cervical region 04/05/2017   Partial tear of right subscapularis tendon    Incisional hernia, without obstruction or gangrene    Urinary frequency 03/21/2012   Migraine equivalent syndrome 03/21/2012   Insomnia 03/21/2012   Rayetta Humphrey, PT CLT [phone]  01/04/2021, 11:38 AM  [facility] [address] [place], [place], [address] Phone: [phone]   Fax:  [phone]  Name: [name] MRN: [id number] Date of Birth: October 10, 1965

## 2021-01-05 MED ORDER — BENRALIZUMAB 30 MG/ML ~~LOC~~ SOSY
30.0000 mg | PREFILLED_SYRINGE | SUBCUTANEOUS | Status: AC
  Administered 2020-12-26 – 2021-02-22 (×3): 30 mg via SUBCUTANEOUS

## 2021-01-05 NOTE — Addendum Note (Signed)
Addended by: Devoria Glassing on: 01/05/2021 02:59 PM   Modules accepted: Orders

## 2021-01-05 NOTE — Telephone Encounter (Signed)
Surgery confirmed with patient and Judeth Cornfield.

## 2021-01-06 ENCOUNTER — Telehealth: Payer: Self-pay | Admitting: *Deleted

## 2021-01-06 ENCOUNTER — Ambulatory Visit: Payer: Medicare Other

## 2021-01-06 ENCOUNTER — Ambulatory Visit (HOSPITAL_COMMUNITY): Payer: Medicare Other

## 2021-01-06 NOTE — Telephone Encounter (Signed)
Noted.  Thank you Tammy!  Malachi Bonds, MD [facility]

## 2021-01-06 NOTE — Telephone Encounter (Signed)
Called patient and advised approval for PAP-free [place] from [place]. They had initially denied her there app but had them reprocess and they are sending out her medication. I went ahead and scheduled her next appt

## 2021-01-09 ENCOUNTER — Telehealth: Payer: Self-pay

## 2021-01-09 NOTE — Telephone Encounter (Signed)
Clinicals submitted to [facility] for pre-cert for Interstim.  Patient was scheduled for UDS at [facility] today however, office lost power and patient was not able to have test completed. [facility] will call patient to reschedule.   Midwife.

## 2021-01-10 ENCOUNTER — Ambulatory Visit (HOSPITAL_COMMUNITY): Payer: Medicare Other | Attending: Physician Assistant | Admitting: Physical Therapy

## 2021-01-10 ENCOUNTER — Encounter (HOSPITAL_COMMUNITY): Payer: Self-pay | Admitting: Physical Therapy

## 2021-01-10 ENCOUNTER — Other Ambulatory Visit: Payer: Self-pay

## 2021-01-10 DIAGNOSIS — R262 Difficulty in walking, not elsewhere classified: Secondary | ICD-10-CM | POA: Insufficient documentation

## 2021-01-10 DIAGNOSIS — M25551 Pain in right hip: Secondary | ICD-10-CM | POA: Diagnosis not present

## 2021-01-10 NOTE — Therapy (Signed)
[place], [place], [address] Phone: [phone]   Fax:  [phone]  Physical Therapy Treatment  Patient Details  Name: [name] MRN: [id number] Date of Birth: November 07, 1965 Referring Provider (PT): Erskine Emery  PHYSICAL THERAPY DISCHARGE SUMMARY  Visits from Start of Care: 12  Current functional level related to goals / functional outcomes: All goals met    Remaining deficits: Some increased hip pain    Education / Equipment: HEP   Patient agrees to discharge. Patient goals were met. Patient is being discharged due to being pleased with the current functional level.  Encounter Date: 01/10/2021   PT End of Session - 01/10/21 1103     Visit Number 12    Number of Visits 12    Date for PT Re-Evaluation 01/07/21    Authorization Type BCBS medicare    Progress Note Due on Visit 20    PT Start Time 1050    Activity Tolerance Patient tolerated treatment well    Behavior During Therapy [facility] for tasks assessed/performed             Past Medical History:  Diagnosis Date   Anemia    Anxiety    Arthritis    Asthma    Cervical radiculopathy    Cluster headaches    COPD (chronic obstructive pulmonary disease) (HCC)    COPD (chronic obstructive pulmonary disease) with chronic bronchitis (HCC)    GERD (gastroesophageal reflux disease)    Hyperlipemia    Hypoglycemia    Hypothyroidism    Migraine    Pneumothorax    Pre-diabetes    Rectal discharge 07/28/2010   Shortness of breath    Sleep apnea    cannot tolerate-not using CPAP   Sluggishness 07/28/2010    Past Surgical History:  Procedure Laterality Date   ABDOMINAL HYSTERECTOMY     with bladder suspension   CERVICAL FUSION     CHOLECYSTECTOMY     COLONOSCOPY N/A 07/30/2013   Procedure: COLONOSCOPY;  Surgeon: Rogene Houston, MD;  [facility];  Service: Endoscopy;  Laterality: N/A;  [place] OF SHOULDER Right 10/11/2016   Procedure: EXAM UNDER ANESTHESIA WITH MANIPULATION OF SHOULDER;  Surgeon: Carole Civil, MD;  Location: [facility];  Service: Orthopedics;  Laterality: Right;   HERNIA REPAIR     INCISIONAL HERNIA REPAIR N/A 07/30/2016   Procedure: HERNIA REPAIR INCISIONAL WITH MESH;  Surgeon: Aviva Signs, MD;  Location: [facility];  Service: General;  Laterality: N/A;   LOBECTOMY     right lung    POSTERIOR CERVICAL LAMINECTOMY Right 04/05/2017   Procedure: Right C6-7 Posterior cervical laminectomy;  Surgeon: Kary Kos, MD;  Location: [facility];  Service: Neurosurgery;  Laterality: Right;  Right C6-7 Posterior cervical laminectomy   SHOULDER ARTHROSCOPY WITH ROTATOR CUFF REPAIR Right 10/11/2016   Procedure: SHOULDER ARTHROSCOPY;  Surgeon: Carole Civil, MD;  Location: [facility];  Service: Orthopedics;  Laterality: Right;   TOTAL HIP ARTHROPLASTY Right 04/15/2020   Procedure: RIGHT TOTAL HIP ARTHROPLASTY ANTERIOR APPROACH;  Surgeon: Mcarthur Rossetti, MD;  Location: [facility];  Service: Orthopedics;  Laterality: Right;   TOTAL HIP ARTHROPLASTY Left 07/22/2020   Procedure: LEFT  HIP ARTHROPLASTY ANTERIOR APPROACH;  Surgeon: Mcarthur Rossetti, MD;  Location: [facility];  Service: Orthopedics;  Laterality: Left;  RNFA   TUBAL LIGATION      There were no vitals filed for this  visit.   Subjective Assessment - 01/10/21 1053     Subjective Pt states she was in alot of pain after last session    Pertinent History B THR,  COPD with O2 dependency, migranes and cervical surgery    Limitations Sitting;Lifting;Standing;Walking;House hold activities    How long can you sit comfortably? 45 was 15    How long can you stand comfortably? 45 minutes was less than five    How long can you walk comfortably? with cane and oxygen:  tends to get out of breath after 5-10 minuts    Currently in Pain? Yes    Pain Score 2     Pain Location Back    Pain Orientation Right    Pain Descriptors  / Indicators Jabbing    Pain Type Acute pain    Pain Onset 1 to 4 weeks ago    Pain Frequency Constant    Aggravating Factors  WB    Pain Relieving Factors meds    Effect of Pain on Daily Activities limits                [facility] PT Assessment - 01/10/21 0001       Assessment   Medical Diagnosis Lumbar radiculopathy    Referring Provider (PT) Erskine Emery    Onset Date/Surgical Date 11/13/20    Next MD Visit not scheduled    Prior Therapy none      Precautions   Precautions Anterior Hip   B     Restrictions   Weight Bearing Restrictions No      [place] residence      Prior Function   Level of Independence Independent    Vocation On disability    Leisure Walk      Cognition   Overall Cognitive Status Within Functional Limits for tasks assessed      Observation/Other Assessments   Focus on Therapeutic Outcomes (FOTO)  functional status: hip 56% (was 31%) Lumbar 56% (was 36.4%)      Functional Tests   Functional tests Sit to Stand;Single leg stance      Single Leg Stance   Comments Rt 48;Lt 36"   was R: 0, Lt: 5"     Sit to Stand   Comments able to stand without UE assist now   was unable to rise without UE assist     Strength   Right Hip Flexion 4-/5   was 2+ on 9/12   Right Hip Extension 4/5   was 2 on 9/12   Right Hip ABduction 4/5   was 3+ on 9/12   Left Hip Flexion 5/5   was 3+ on 9/12   Left Hip Extension 3-/5   was 3- on 9/12   Left Hip ABduction 5/5    Right Knee Flexion 4+/5   was 4 on 9/12   Right Knee Extension 4+/5   was 3- on 9/12   Left Knee Flexion 5/5   was 4+ on 9/12   Left Knee Extension 5/5   was 5 on 9/12   Right Ankle Dorsiflexion 4+/5   was 3+ on 9/12   Left Ankle Dorsiflexion 5/5   was 4 on 9/12     Ambulation/Gait   Ambulation Distance (Feet) 266 Feet   was 85with SPC   Assistive device Straight cane   and O 2   Gait Pattern Decreased arm swing - right;Decreased step length -  right;Decreased dorsiflexion - right;Decreased dorsiflexion -  left    Gait Comments 2 MWT                           [facility] Adult PT Treatment/Exercise - 01/10/21 0001       Lumbar Exercises: Standing   Heel Raises 15 reps    Functional Squats 15 reps    Other Standing Lumbar Exercises single leg stance Lt; 48: rt 36      Lumbar Exercises: Seated   Sit to Stand 10 reps      Lumbar Exercises: Prone   Straight Leg Raise 15 reps                     PT Education - 01/10/21 1135     Education Details advanced HEP    Person(s) Educated Patient    Methods Handout;Explanation    Comprehension Returned demonstration              PT Short Term Goals - 01/10/21 1138       PT SHORT TERM GOAL #1   Title Pt to be completing HEP to decrease radicular sx to buttock only    Time 2    Period Weeks    Status Achieved    Target Date 12/22/20      PT SHORT TERM GOAL #2   Title Pt core and LE strength to be improved to allow pt to be able to come sit to stand from a firm, higher chair without UE assist    Baseline 9/26: can complete but not in good form and with increased time    Time 2    Period Weeks    Status Achieved               PT Long Term Goals - 01/10/21 1138       PT LONG TERM GOAL #1   Title PT to have an advance HEP to be able to erradicate radicular sx while walking.    Time 4    Period Weeks    Status Achieved      PT LONG TERM GOAL #2   Title PT core and LE strength to be increased one grade to allow pt to be able to come sit to stand from a couch without UE assist    Baseline 9/26:  All mm with increase of 1/2 grade to 1 grade; completed one rep of standing without UE but with extreme fwd bent posturing and increased time.    Time 4    Period Weeks    Status Achieved      PT LONG TERM GOAL #3   Title PT to be able to single leg stance on both LE for at least 10 seconds to decrease risk of falling    Baseline 9/26:  7" max  on RT, 20" on Lt    Time 4    Period Weeks    Status Achieved      PT LONG TERM GOAL #4   Title PT Rt hip and leg pain to be decreased to no greater than a 3/10 to allow pt to be able to walk 226 ft in a 2 minutes using her cane and O2    Baseline 9/26:  Pain is still 4/10 but able to complete 250 feet today in 2 minutes with SPC and O2    Time 4    Period Weeks    Status Achieved  Plan - 01/10/21 1112     Personal Factors and Comorbidities Comorbidity 3+;Fitness    Comorbidities B THR, COPD< cervical surgery, anxiety, depression    Examination-Activity Limitations Bed Mobility;Caring for Others;Carry;Dressing;Lift;Locomotion Level;Stand;Stairs;Squat;Sit    Examination-Participation Restrictions Cleaning;Shop    Stability/Clinical Decision Making Evolving/Moderate complexity    Rehab Potential Fair    PT Frequency 3x / week    PT Duration 4 weeks    PT Treatment/Interventions Gait training;Stair training;Therapeutic exercise;Patient/family education;Manual techniques    PT Next Visit Plan check SI ,  review HEP in preparation for D/C    PT Home Exercise Plan t band resisted dorsiflexion B, knee extension B, hip abduction B, hip adduction B; 9/6: bridge and SLR; 12/15/20: clam and abd  9/23: vectors; 10/4 sit to stand, squat, heel raises side step, hip extension    Consulted and Agree with Plan of Care Patient             Patient will benefit from skilled therapeutic intervention in order to improve the following deficits and impairments:  Abnormal gait, Cardiopulmonary status limiting activity, Decreased activity tolerance, Decreased balance, Decreased mobility, Decreased range of motion, Decreased strength, Difficulty walking, Pain  Visit Diagnosis: Pain in right hip  Difficulty in walking, not elsewhere classified     Problem List Patient Active Problem List   Diagnosis Date Noted   Radiculopathy, lumbar region 12/08/2020   Status post left  hip replacement 07/22/2020   Status post right hip replacement 05/02/2020   Status post total replacement of right hip 04/15/2020   Avascular necrosis of bone of left hip ([place]) 02/10/2020   Avascular necrosis of bone of right hip ([place]) 02/10/2020   Allergic rhinitis 05/21/2019   Chronic asthmatic bronchitis ([place]) 04/30/2019   Chronic respiratory failure with hypoxia ([place]) 04/30/2019   Spinal stenosis of cervical region 04/05/2017   Partial tear of right subscapularis tendon    Incisional hernia, without obstruction or gangrene    Urinary frequency 03/21/2012   Migraine equivalent syndrome 03/21/2012   Insomnia 03/21/2012   Rayetta Humphrey, PT CLT [phone] , PT 01/10/2021, 11:39 AM  [facility] [address] [place], [place], [address] Phone: [phone]   Fax:  [phone]  Name: ESSICA KIKER MRN: [id number] Date of Birth: [birth year]

## 2021-01-11 ENCOUNTER — Telehealth: Payer: Self-pay | Admitting: Orthopaedic Surgery

## 2021-01-13 ENCOUNTER — Ambulatory Visit: Payer: Medicare Other | Admitting: Urology

## 2021-01-13 NOTE — Telephone Encounter (Signed)
Patient reports she had UDS prior to [facility] losing power but was told that her bladder does not function well on its own. Patient is worried that she will still have incontinence episodes with interstim. Patient stated if she will still need to wear depends for incontinence then she would rather not move forward with surgery.  Please advise.

## 2021-01-18 ENCOUNTER — Ambulatory Visit
Admission: RE | Admit: 2021-01-18 | Discharge: 2021-01-18 | Disposition: A | Discharge status: Home / Self Care | Payer: Medicare Other | Source: Ambulatory Visit | Attending: Orthopaedic Surgery | Admitting: Orthopaedic Surgery

## 2021-01-18 DIAGNOSIS — G8929 Other chronic pain: Secondary | ICD-10-CM

## 2021-01-19 NOTE — Patient Instructions (Addendum)
1. Asthma-COPD overlap syndrome -Continue to follow up with Dr. Sherene Sires with your continued chronic steroid use.  Recommend talking with Dr. Sherene Sires PCP about a DEXA scan and eye exam due to your chronic steroid use. Also, recommend discussing with Dr. Sherene Sires about pulmonary rehab or other options for your dyspnea -Start Flovent 220 mcg using 2 puffs twice a day with spacer to help prevent cough and wheeze - Daily controller medication(s): Stiolto two puffs once daily + prednisone 10mg  daily + Singulair 10mg  daily - Rescue medications: albuterol nebulizer one vial every 4-6 hours as needed - Asthma control goals:  * Full participation in all desired activities (may need albuterol before activity) * Albuterol use two time or less a week on average (not counting use with activity) * Cough interfering with sleep two time or less a month * Oral steroids no more than once a year * No hospitalizations   2.  Seasonal and perennial allergic rhinitis (horse, tobacco leaf, grasses, ragweed, trees, indoor molds, outdoor molds, dust mites, cat and dog) - Continue with: Zyrtec (cetirizine) 10mg  tablet once daily and Singulair (montelukast) 10mg  daily and Nasacort (triamcinolone) two sprays per nostril daily AS NEEDED - Continue with nasal saline rinses.  Please let know if this treatment plan is not working well for you. Schedule a follow up appointment in 4 weeks or sooner if needed with Dr. 

## 2021-01-20 ENCOUNTER — Encounter: Payer: Self-pay | Admitting: Family

## 2021-01-20 ENCOUNTER — Other Ambulatory Visit: Payer: Self-pay

## 2021-01-20 ENCOUNTER — Ambulatory Visit: Payer: Medicare Other | Admitting: Family

## 2021-01-20 VITALS — BP 102/66 | HR 94 | Temp 97.1°F | Resp 16 | Ht 64.0 in | Wt 149.0 lb

## 2021-01-20 DIAGNOSIS — J449 Chronic obstructive pulmonary disease, unspecified: Secondary | ICD-10-CM

## 2021-01-20 DIAGNOSIS — J302 Other seasonal allergic rhinitis: Secondary | ICD-10-CM

## 2021-01-20 DIAGNOSIS — J3089 Other allergic rhinitis: Secondary | ICD-10-CM

## 2021-01-20 MED ORDER — FLUTICASONE PROPIONATE HFA 220 MCG/ACT IN AERO: 2.0000 | INHALATION_SPRAY | Freq: Two times a day (BID) | RESPIRATORY_TRACT | 2 refills | Status: DC

## 2021-01-20 MED ORDER — MONTELUKAST SODIUM 10 MG PO TABS: 10.0000 mg | ORAL_TABLET | Freq: Every day | ORAL | 5 refills | Status: DC

## 2021-01-20 MED ORDER — SPIRO PD DEVI: 1.0000 | 1 refills | Status: DC | PRN

## 2021-01-20 NOTE — Progress Notes (Signed)
[address] [place] [place] [address] Dept: [phone]  FOLLOW UP NOTE  Patient ID: [name], female    DOB: 1966-01-27  Age: 55 y.o. MRN: [id number] Date of Office Visit: 01/20/2021  Assessment  Chief Complaint: Severe persistent asthma without complication  HPI [name] is a 55 year old female who presents today for follow-up of asthma-COPD overlap syndrome and seasonal and perennial allergic rhinitis.  She was last seen on December 23, 2020 by Nehemiah Settle, FNP.  She reports on October 27 she will have stage I InterStim implant completed.  Asthma/COPD overlap syndrome is reported as not well controlled with oxygen at 3 L minute , prednisone 10 mg once a day, Singulair 10 mg once a day,  Stiolto 2 puffs once a day, and Fasenra injections.  She is due for her second Fasenra injection on January 23, 2021.  She reports that she has been having a lot of asthma attacks since her last appointment.  She mentions that while she was at [facility] on Saturday she had a bad asthma attack and should have gone to the emergency room but she did not.  She reports during this episode she used her albuterol 4 times and did her breathing technique.  She also increased her prednisone as recommended by Dr. Sherene Sires, her pulmonologist. She reports that from Sunday through Tuesday she took 2 prednisone 10 mg pills once a day.  On Wednesday she decreased back to prednisone 10 mg once a day.  She reports when she increased her prednisone that it helped and she did not have as much tightness.  She reports a productive cough at times with gray sputum.  This has been ongoing for the past 2 weeks.  She also reports shortness of breath, gasping, and tightness in her chest.  She did mention that the steroid burst that was given at her last office visit helped for a couple weeks.  She denies any wheezing, fever, or chills.  Since her last office visit she is not made any trips to the emergency  room or urgent care due to breathing problems.  She has been using her albuterol inhaler at least 2-3 times a day.  She reports a history of avascular necrosis which was found when she had imaging.  Past she has tried Advair and this did not work.  She has also tried [place].  With Trelegy she could not inhale the powder and as soon as it hit the back of her throat she coughed it up.  She is also tried Clinical cytogeneticist but is not sure why she stopped taking it.  Seasonal and perennial allergic rhinitis is reported as moderately controlled with cetirizine 10 mg once a day, Singulair 10 mg once a day, and Nasacort nasal spray as needed.  She reports occasional clear rhinorrhea and denies postnasal drip and nasal congestion.  She has not had any sinus infections since we last saw her.   Drug Allergies:  Allergies  Allergen Reactions   Aspirin Shortness Of Breath    Causes asthma flares    Penicillins Other (See Comments)    UNSPECIFIED REACTION OF CHILDHOOD  Has patient had a PCN reaction causing immediate rash, facial/tongue/throat swelling, SOB or lightheadedness with hypotension: NO Has patient had a PCN reaction causing severe rash involving mucus membranes or skin necrosis: NO Has patient had a PCN reaction that required hospitalization: no Has patient had a PCN reaction occurring within the last 10 years: NO If all of the  above answers are "NO", then may proceed with Cephalosporin use.    Percocet [Oxycodone-Acetaminophen]     migraines   Tramadol     insomnia   Vicodin [Hydrocodone-Acetaminophen] Other (See Comments)    Headaches     Review of Systems: Review of Systems  Constitutional:  Negative for chills and fever.  HENT:         Reports clear rhinorrhea.  Denies nasal congestion and postnasal drip  Eyes:        Reports itchy watery eyes but mentions that her eye doctor has addressed this issue.  Respiratory:  Positive for cough and shortness of breath. Negative for wheezing.         She reports today productive cough at times with gray sputum, shortness of breath gasping and tightness in her chest.  She denies wheezing   Cardiovascular:  Negative for chest pain and palpitations.  Gastrointestinal:        Reports that she takes pantoprazole twice a day and Tums as needed for reflux symptoms  Skin:  Negative for itching and rash.  Neurological:  Negative for headaches.  Endo/Heme/Allergies:  Positive for environmental allergies.    Physical Exam: BP 102/66 (BP Location: Left Arm, Patient Position: Sitting, Cuff Size: Normal)   Pulse 94   Temp (!) 97.1 F (36.2 C) (Temporal)   Resp 16   Ht 5\' 4"  (1.626 m)   Wt 149 lb (67.6 kg)   SpO2 98% Comment: 3 liters O2  BMI 25.58 kg/m    Physical Exam Constitutional:      Appearance: Normal appearance.  HENT:     Head: Normocephalic and atraumatic.     Comments: Pharynx normal, eyes normal: Bilateral lower turbinates mildly edematous and slightly with no drainage noted    Right Ear: Tympanic membrane, ear canal and external ear normal.     Left Ear: Tympanic membrane, ear canal and external ear normal.     Mouth/Throat:     Mouth: Mucous membranes are moist.     Pharynx: Oropharynx is clear.  Eyes:     Conjunctiva/sclera: Conjunctivae normal.  Cardiovascular:     Rate and Rhythm: Regular rhythm.     Heart sounds: Normal heart sounds.  Pulmonary:     Effort: Pulmonary effort is normal.     Comments: Lungs clear to auscultation but diminished throughout Musculoskeletal:     Cervical back: Neck supple.  Skin:    General: Skin is warm.  Neurological:     Mental Status: She is alert and oriented to person, place, and time.  Psychiatric:        Mood and Affect: Mood normal.        Behavior: Behavior normal.        Thought Content: Thought content normal.        Judgment: Judgment normal.    Diagnostics: FVC 1.71 L (52%), FEV1 1.70 L (66%).  Predicted FVC 3.26 L, predicted FEV1 2.58 L.  Spirometry indicates  possible moderate restriction.  Post bronchodilator response shows FVC 2.46 L (75%), FEV1 1.92 L (74%).  There is a 13% change in FEV1  Assessment and Plan: 1. Asthma-COPD overlap syndrome (HCC)   2. Seasonal and perennial allergic rhinitis     Meds ordered this encounter  Medications   montelukast (SINGULAIR) 10 MG tablet    Sig: Take 1 tablet (10 mg total) by mouth at bedtime.    Dispense:  30 tablet    Refill:  5   Respiratory Therapy  Supplies (SPIRO PD) DEVI    Sig: 1 applicator by Does not apply route as needed.    Dispense:  1 each    Refill:  1   fluticasone (FLOVENT HFA) 220 MCG/ACT inhaler    Sig: Inhale 2 puffs into the lungs 2 (two) times daily.    Dispense:  12 g    Refill:  2    Start Flovent 220 mcg using 2 puffs twice a day with spacer to help prevent cough and wheeze     Patient Instructions  1. Asthma-COPD overlap syndrome -Continue to follow up with Dr. Sherene Sires with your continued chronic steroid use.  Recommend talking with Dr. Sherene Sires PCP about a DEXA scan and eye exam due to your chronic steroid use. Also, recommend discussing with Dr. Sherene Sires about pulmonary rehab or other options for your dyspnea -Start Flovent 220 mcg using 2 puffs twice a day with spacer to help prevent cough and wheeze - Daily controller medication(s): Stiolto two puffs once daily + prednisone 10mg  daily + Singulair 10mg  daily - Rescue medications: albuterol nebulizer one vial every 4-6 hours as needed - Asthma control goals:  * Full participation in all desired activities (may need albuterol before activity) * Albuterol use two time or less a week on average (not counting use with activity) * Cough interfering with sleep two time or less a month * Oral steroids no more than once a year * No hospitalizations   2.  Seasonal and perennial allergic rhinitis (horse, tobacco leaf, grasses, ragweed, trees, indoor molds, outdoor molds, dust mites, cat and dog) - Continue with: Zyrtec (cetirizine)  10mg  tablet once daily and Singulair (montelukast) 10mg  daily and Nasacort (triamcinolone) two sprays per nostril daily AS NEEDED - Continue with nasal saline rinses.  Please let know if this treatment plan is not working well for you. Schedule a follow up appointment in 4 weeks or sooner if needed with Dr.      Return in about 4 weeks (around 02/17/2021), or if symptoms worsen or fail to improve.    Thank you for the opportunity to care for this patient.  Please do not hesitate to contact me with questions.  , FNP [facility]

## 2021-01-21 ENCOUNTER — Other Ambulatory Visit: Payer: Medicare Other

## 2021-01-23 ENCOUNTER — Other Ambulatory Visit: Payer: Self-pay

## 2021-01-23 ENCOUNTER — Ambulatory Visit (INDEPENDENT_AMBULATORY_CARE_PROVIDER_SITE_OTHER): Payer: Medicare Other

## 2021-01-23 DIAGNOSIS — J4550 Severe persistent asthma, uncomplicated: Secondary | ICD-10-CM | POA: Diagnosis not present

## 2021-01-23 MED ORDER — AEROCHAMBER PLUS MISC: 1.0000 | 0 refills | Status: DC | PRN

## 2021-01-24 ENCOUNTER — Telehealth: Payer: Medicare Other | Admitting: Urology

## 2021-01-25 ENCOUNTER — Ambulatory Visit: Payer: 59 | Admitting: Orthopaedic Surgery

## 2021-01-30 ENCOUNTER — Encounter: Payer: Self-pay | Admitting: Orthopaedic Surgery

## 2021-01-30 ENCOUNTER — Ambulatory Visit: Payer: Medicare Other | Admitting: Orthopaedic Surgery

## 2021-01-30 DIAGNOSIS — M47816 Spondylosis without myelopathy or radiculopathy, lumbar region: Secondary | ICD-10-CM | POA: Diagnosis not present

## 2021-01-30 DIAGNOSIS — G8929 Other chronic pain: Secondary | ICD-10-CM | POA: Diagnosis not present

## 2021-01-30 DIAGNOSIS — M5441 Lumbago with sciatica, right side: Secondary | ICD-10-CM | POA: Diagnosis not present

## 2021-01-30 NOTE — Progress Notes (Signed)
The patient comes in today to go over MRI of her lumbar spine.  She has been having significant right-sided low back pain and sciatica.  She is also been dealing with urologic issues and does have a series of urologic procedures coming up there related to urinary issues.  The MRI did not show any evidence of pathology around the conus medullaris or cauda equina areas of her lumbar spine.  She does have some foraminal stenosis at L4-L5 to the right as well as some narrowing at L3-L4 and some of this is mild progression from comparing MRI of 2019.  She will need to hold off on any type of intervention of the lumbar spine such as injections which Next step to recommend given the fact that she does have urologic procedures that are coming up we will involve potentially and surgeons to the right side of her lower spine.  I have asked her to inform her urologist about her spine issues and to let her know when it would be appropriate for her to set up a steroid injection such as an epidural in her lumbar spine.  Once we have that clearance, we can schedule her for a right-sided ESI by Dr. Alvester Morin at L3-L4 versus L4-L5.  She will let us know.

## 2021-01-30 NOTE — Patient Instructions (Signed)
Your procedure is scheduled on: 02/02/2021  Report to [facility] Short Stay at     12:00 PM.  Call this number if you have problems the morning of surgery: [phone]   Remember:   Do not Eat or Drink after midnight         No Smoking the morning of surgery  :  Take these medicines the morning of surgery with A SIP OF WATER: Levothyroxine, lorazapam, gabapentin, pantoprazole, pepcid and Zyrtec   use inhalers if needed.   Patients discharged the day of surgery will not be allowed to drive home.    Special Instructions: Shower using CHG night before surgery and shower the day of surgery use CHG.  Use special wash - you have one bottle of CHG for all showers.  You should use approximately 1/2 of the bottle for each shower.  How to Use Chlorhexidine for Bathing Chlorhexidine gluconate (CHG) is a germ-killing (antiseptic) solution that is used to clean the skin. It can get rid of the bacteria that normally live on the skin and can keep them away for about 24 hours. To clean your skin with CHG, you may be given: A CHG solution to use in the shower or as part of a sponge bath. A prepackaged cloth that contains CHG. Cleaning your skin with CHG may help lower the risk for infection: While you are staying in the intensive care unit of the hospital. If you have a vascular access, such as a central line, to provide short-term or long-term access to your veins. If you have a catheter to drain urine from your bladder. If you are on a ventilator. A ventilator is a machine that helps you breathe by moving air in and out of your lungs. After surgery. What are the risks? Risks of using CHG include: A skin reaction. Hearing loss, if CHG gets in your ears and you have a perforated eardrum. Eye injury, if CHG gets in your eyes and is not rinsed out. The CHG product catching fire. Make sure that you avoid smoking and flames after applying CHG to your skin. Do not use CHG: If you have a chlorhexidine  allergy or have previously reacted to chlorhexidine. On babies younger than 69 months of age. How to use CHG solution Use CHG only as told by your health care provider, and follow the instructions on the label. Use the full amount of CHG as directed. Usually, this is one bottle. During a shower Follow these steps when using CHG solution during a shower (unless your health care provider gives you different instructions): Start the shower. Use your normal soap and shampoo to wash your face and hair. Turn off the shower or move out of the shower stream. Pour the CHG onto a clean washcloth. Do not use any type of brush or rough-edged sponge. Starting at your neck, lather your body down to your toes. Make sure you follow these instructions: If you will be having surgery, pay special attention to the part of your body where you will be having surgery. Scrub this area for at least 1 minute. Do not use CHG on your head or face. If the solution gets into your ears or eyes, rinse them well with water. Avoid your genital area. Avoid any areas of skin that have broken skin, cuts, or scrapes. Scrub your back and under your arms. Make sure to wash skin folds. Let the lather sit on your skin for 1-2 minutes or as long as told  by your health care provider. Thoroughly rinse your entire body in the shower. Make sure that all body creases and crevices are rinsed well. Dry off with a clean towel. Do not put any substances on your body afterward--such as powder, lotion, or perfume--unless you are told to do so by your health care provider. Only use lotions that are recommended by the manufacturer. Put on clean clothes or pajamas. If it is the night before your surgery, sleep in clean sheets.  During a sponge bath Follow these steps when using CHG solution during a sponge bath (unless your health care provider gives you different instructions): Use your normal soap and shampoo to wash your face and hair. Pour the  CHG onto a clean washcloth. Starting at your neck, lather your body down to your toes. Make sure you follow these instructions: If you will be having surgery, pay special attention to the part of your body where you will be having surgery. Scrub this area for at least 1 minute. Do not use CHG on your head or face. If the solution gets into your ears or eyes, rinse them well with water. Avoid your genital area. Avoid any areas of skin that have broken skin, cuts, or scrapes. Scrub your back and under your arms. Make sure to wash skin folds. Let the lather sit on your skin for 1-2 minutes or as long as told by your health care provider. Using a different clean, wet washcloth, thoroughly rinse your entire body. Make sure that all body creases and crevices are rinsed well. Dry off with a clean towel. Do not put any substances on your body afterward--such as powder, lotion, or perfume--unless you are told to do so by your health care provider. Only use lotions that are recommended by the manufacturer. Put on clean clothes or pajamas. If it is the night before your surgery, sleep in clean sheets. How to use CHG prepackaged cloths Only use CHG cloths as told by your health care provider, and follow the instructions on the label. Use the CHG cloth on clean, dry skin. Do not use the CHG cloth on your head or face unless your health care provider tells you to. When washing with the CHG cloth: Avoid your genital area. Avoid any areas of skin that have broken skin, cuts, or scrapes. Before surgery Follow these steps when using a CHG cloth to clean before surgery (unless your health care provider gives you different instructions): Using the CHG cloth, vigorously scrub the part of your body where you will be having surgery. Scrub using a back-and-forth motion for 3 minutes. The area on your body should be completely wet with CHG when you are done scrubbing. Do not rinse. Discard the cloth and let the area  air-dry. Do not put any substances on the area afterward, such as powder, lotion, or perfume. Put on clean clothes or pajamas. If it is the night before your surgery, sleep in clean sheets.  For general bathing Follow these steps when using CHG cloths for general bathing (unless your health care provider gives you different instructions). Use a separate CHG cloth for each area of your body. Make sure you wash between any folds of skin and between your fingers and toes. Wash your body in the following order, switching to a new cloth after each step: The front of your neck, shoulders, and chest. Both of your arms, under your arms, and your hands. Your stomach and groin area, avoiding the genitals. Your right leg  and foot. Your left leg and foot. The back of your neck, your back, and your buttocks. Do not rinse. Discard the cloth and let the area air-dry. Do not put any substances on your body afterward--such as powder, lotion, or perfume--unless you are told to do so by your health care provider. Only use lotions that are recommended by the manufacturer. Put on clean clothes or pajamas. Contact a health care provider if: Your skin gets irritated after scrubbing. You have questions about using your solution or cloth. You swallow any chlorhexidine. Call your local poison control center [phone] in the [place] Get help right away if: Your eyes itch badly, or they become very red or swollen. Your skin itches badly and is red or swollen. Your hearing changes. You have trouble seeing. You have swelling or tingling in your mouth or throat. You have trouble breathing. These symptoms may represent a serious problem that is an emergency. Do not wait to see if the symptoms will go away. Get medical help right away. Call your local emergency services (911 in the [place] Do not drive yourself to the hospital. Summary Chlorhexidine gluconate (CHG) is a germ-killing (antiseptic) solution that is used  to clean the skin. Cleaning your skin with CHG may help to lower your risk for infection. You may be given CHG to use for bathing. It may be in a bottle or in a prepackaged cloth to use on your skin. Carefully follow your health care provider's instructions and the instructions on the product label. Do not use CHG if you have a chlorhexidine allergy. Contact your health care provider if your skin gets irritated after scrubbing. This information is not intended to replace advice given to you by your health care provider. Make sure you discuss any questions you have with your health care provider. Document Revised: 06/06/2020 Document Reviewed: 06/06/2020 Elsevier Patient Education  2022 [employer] Sacral Nerve Stimulator Implantation, Care After The following information offers guidance on how to care for yourself after your procedure. Your health care provider may also give you more specific instructions. If you have problems or questions, contact your health care provider. What can I expect after the procedure? After the procedure, it is common to have soreness or pain in the incision area. Follow these instructions at home: Medicines Take over-the-counter and prescription medicines only as told by your health care provider. If you were prescribed an antibiotic medicine, take it as told by your health care provider. Do not stop using the antibiotic even if you start to feel better. Ask your health care provider if the medicine prescribed to you requires you to avoid driving or using machinery. Incision care   Follow instructions from your health care provider about how to take care of your incisions. Make sure you: Wash your hands with soap and water for at least 20 seconds before and after you change your bandage (dressing). If soap and water are not available, use hand sanitizer. Change your dressing as told by your health care provider. Leave stitches (sutures), staples, skin glue, or  adhesive strips in place. These skin closures may need to stay in place for 2 weeks or longer. If adhesive strip edges start to loosen and curl up, you may trim the loose edges. Do not remove adhesive strips completely unless your health care provider tells you to do that. Check your incision area every day for signs of infection. Check for: Redness, swelling, or more pain. Fluid or blood. Warmth. Pus or a  bad smell. Activity Follow instructions from your health care provider about any activity restrictions. You may need to avoid: Bending, twisting, or stretching. Having sex. Do not lift anything that is heavier than 10 lb (4.5 kg), or the limit that you are told, until your health care provider says that it is safe. Return to your normal activities as told by your health care provider. Ask your health care provider what activities are safe for you. Bathing Do not take baths, swim, or use a hot tub until your health care provider approves. Ask your health care provider if you may take showers. You may only be allowed to take sponge baths. Keep the dressing dry until your health care provider says it can be removed. Using the stimulator You will be given a remote control device. This device will allow you to turn your sacral nerve stimulator on and off. Follow instructions from your health care provider about how to use this device. Tell all of your health care providers that you have this device. Remind them that you have the device before they do any tests or procedures. General instructions If you were given a sedative during the procedure, it can affect you for several hours. Do not drive or operate machinery until your health care provider says that it is safe. Keep all follow-up visits. This is important. Your health care provider may need to adjust the stimulator over several visits until it works well for you. Contact a health care provider if: Your device stops working. The device is  not helping your symptoms. You have a fever or chills. You have pus or a bad smell coming from an incision. You have redness, swelling, or more pain around an incision. You have fluid or blood coming from an incision. An incision feels warm to the touch. Summary After the procedure, it is common to have soreness or pain in the incision area. Follow instructions from your health care provider about how to take care of your incision. Also, follow instructions about any activity restrictions. You may need to avoid activities that involve a lot of bending, twisting, or stretching. Tell all of your health care providers that you have this device. Remind them that you have the device before they do any tests or procedures. Contact your health care provider if your device stops working, or if it is not helping to treat your symptoms. Contact your health care provider if you have a fever, or if there is redness, warmth, swelling, pain, pus, or a bad smell in or around an incision. This information is not intended to replace advice given to you by your health care provider. Make sure you discuss any questions you have with your health care provider. Document Revised: 10/30/2019 Document Reviewed: 10/30/2019 Elsevier Patient Education  2022 [employer]. General Anesthesia, Adult, Care After This sheet gives you information about how to care for yourself after your procedure. Your health care provider may also give you more specific instructions. If you have problems or questions, contact your health care provider. What can I expect after the procedure? After the procedure, the following side effects are common: Pain or discomfort at the IV site. Nausea. Vomiting. Sore throat. Trouble concentrating. Feeling cold or chills. Feeling weak or tired. Sleepiness and fatigue. Soreness and body aches. These side effects can affect parts of the body that were not involved in surgery. Follow these  instructions at home: For the time period you were told by your health care provider:  Rest. Do not participate in activities where you could fall or become injured. Do not drive or use machinery. Do not drink alcohol. Do not take sleeping pills or medicines that cause drowsiness. Do not make important decisions or sign legal documents. Do not take care of children on your own. Eating and drinking Follow any instructions from your health care provider about eating or drinking restrictions. When you feel hungry, start by eating small amounts of foods that are soft and easy to digest (bland), such as toast. Gradually return to your regular diet. Drink enough fluid to keep your urine pale yellow. If you vomit, rehydrate by drinking water, juice, or clear broth. General instructions If you have sleep apnea, surgery and certain medicines can increase your risk for breathing problems. Follow instructions from your health care provider about wearing your sleep device: Anytime you are sleeping, including during daytime naps. While taking prescription pain medicines, sleeping medicines, or medicines that make you drowsy. Have a responsible adult stay with you for the time you are told. It is important to have someone help care for you until you are awake and alert. Return to your normal activities as told by your health care provider. Ask your health care provider what activities are safe for you. Take over-the-counter and prescription medicines only as told by your health care provider. If you smoke, do not smoke without supervision. Keep all follow-up visits as told by your health care provider. This is important. Contact a health care provider if: You have nausea or vomiting that does not get better with medicine. You cannot eat or drink without vomiting. You have pain that does not get better with medicine. You are unable to pass urine. You develop a skin rash. You have a fever. You have  redness around your IV site that gets worse. Get help right away if: You have difficulty breathing. You have chest pain. You have blood in your urine or stool, or you vomit blood. Summary After the procedure, it is common to have a sore throat or nausea. It is also common to feel tired. Have a responsible adult stay with you for the time you are told. It is important to have someone help care for you until you are awake and alert. When you feel hungry, start by eating small amounts of foods that are soft and easy to digest (bland), such as toast. Gradually return to your regular diet. Drink enough fluid to keep your urine pale yellow. Return to your normal activities as told by your health care provider. Ask your health care provider what activities are safe for you. This information is not intended to replace advice given to you by your health care provider. Make sure you discuss any questions you have with your health care provider. Document Revised: 12/10/2019 Document Reviewed: 07/09/2019 Elsevier Patient Education  2022 [employer].

## 2021-01-31 ENCOUNTER — Encounter (HOSPITAL_COMMUNITY)
Admission: RE | Admit: 2021-01-31 | Discharge: 2021-01-31 | Disposition: A | Discharge status: Home / Self Care | Payer: Medicare Other | Source: Ambulatory Visit | Attending: Urology | Admitting: Urology

## 2021-01-31 ENCOUNTER — Other Ambulatory Visit: Payer: Self-pay

## 2021-01-31 ENCOUNTER — Encounter (HOSPITAL_COMMUNITY): Payer: Self-pay

## 2021-01-31 VITALS — BP 90/58 | HR 81 | Temp 97.1°F | Resp 16 | Ht 64.0 in | Wt 151.0 lb

## 2021-01-31 DIAGNOSIS — D649 Anemia, unspecified: Secondary | ICD-10-CM | POA: Diagnosis not present

## 2021-01-31 DIAGNOSIS — R350 Frequency of micturition: Secondary | ICD-10-CM

## 2021-01-31 HISTORY — DX: Other specified postprocedural states: Z98.890

## 2021-01-31 HISTORY — DX: Nausea with vomiting, unspecified: R11.2

## 2021-01-31 LAB — CBC
HCT: 34.6 % — ABNORMAL LOW (ref 36.0–46.0)
Hemoglobin: 10.8 g/dL — ABNORMAL LOW (ref 12.0–15.0)
MCH: 28.9 pg (ref 26.0–34.0)
MCHC: 31.2 g/dL (ref 30.0–36.0)
MCV: 92.5 fL (ref 80.0–100.0)
Platelets: 320 10*3/uL (ref 150–400)
RBC: 3.74 MIL/uL — ABNORMAL LOW (ref 3.87–5.11)
RDW: 15 % (ref 11.5–15.5)
WBC: 11.2 10*3/uL — ABNORMAL HIGH (ref 4.0–10.5)
nRBC: 0 % (ref 0.0–0.2)

## 2021-02-02 ENCOUNTER — Ambulatory Visit (HOSPITAL_COMMUNITY): Payer: Medicare Other

## 2021-02-02 ENCOUNTER — Ambulatory Visit (HOSPITAL_COMMUNITY): Payer: Medicare Other | Admitting: Anesthesiology

## 2021-02-02 ENCOUNTER — Ambulatory Visit (HOSPITAL_COMMUNITY)
Admission: RE | Admit: 2021-02-02 | Discharge: 2021-02-02 | Disposition: A | Discharge status: Home / Self Care | Payer: Medicare Other | Attending: Urology | Admitting: Urology

## 2021-02-02 ENCOUNTER — Encounter (HOSPITAL_COMMUNITY): Payer: Self-pay | Admitting: Urology

## 2021-02-02 ENCOUNTER — Encounter (HOSPITAL_COMMUNITY)
Admission: RE | Disposition: A | Discharge status: Home / Self Care | Payer: Self-pay | Source: Home / Self Care | Attending: Urology

## 2021-02-02 DIAGNOSIS — Z886 Allergy status to analgesic agent status: Secondary | ICD-10-CM | POA: Insufficient documentation

## 2021-02-02 DIAGNOSIS — N3941 Urge incontinence: Secondary | ICD-10-CM | POA: Insufficient documentation

## 2021-02-02 DIAGNOSIS — Z87891 Personal history of nicotine dependence: Secondary | ICD-10-CM | POA: Diagnosis not present

## 2021-02-02 DIAGNOSIS — Z888 Allergy status to other drugs, medicaments and biological substances status: Secondary | ICD-10-CM | POA: Insufficient documentation

## 2021-02-02 DIAGNOSIS — Z880 Allergy status to penicillin: Secondary | ICD-10-CM | POA: Insufficient documentation

## 2021-02-02 DIAGNOSIS — Z96643 Presence of artificial hip joint, bilateral: Secondary | ICD-10-CM | POA: Diagnosis not present

## 2021-02-02 DIAGNOSIS — Z885 Allergy status to narcotic agent status: Secondary | ICD-10-CM | POA: Diagnosis not present

## 2021-02-02 DIAGNOSIS — N3281 Overactive bladder: Secondary | ICD-10-CM

## 2021-02-02 HISTORY — PX: INTERSTIM IMPLANT PLACEMENT: SHX5130

## 2021-02-02 SURGERY — INSERTION, SACRAL NERVE STIMULATOR, INTERSTIM, STAGE 1
Anesthesia: Monitor Anesthesia Care

## 2021-02-02 MED ORDER — ORAL CARE MOUTH RINSE: 15.0000 mL | Freq: Once | OROMUCOSAL | Status: AC

## 2021-02-02 MED ORDER — SULFAMETHOXAZOLE-TRIMETHOPRIM 800-160 MG PO TABS: 1.0000 | ORAL_TABLET | Freq: Two times a day (BID) | ORAL | 0 refills | Status: DC

## 2021-02-02 MED ORDER — LIDOCAINE-EPINEPHRINE (PF) 1 %-1:200000 IJ SOLN
INTRAMUSCULAR | Status: DC | PRN
  Administered 2021-02-02: 12 mL
  Administered 2021-02-02: 8 mL

## 2021-02-02 MED ORDER — PROPOFOL 500 MG/50ML IV EMUL
INTRAVENOUS | Status: DC | PRN
  Administered 2021-02-02: 150 ug/kg/min via INTRAVENOUS

## 2021-02-02 MED ORDER — FENTANYL CITRATE PF 50 MCG/ML IJ SOSY
PREFILLED_SYRINGE | INTRAMUSCULAR | Status: AC
  Filled 2021-02-02: qty 1

## 2021-02-02 MED ORDER — DEXMEDETOMIDINE (PRECEDEX) IN NS 20 MCG/5ML (4 MCG/ML) IV SYRINGE
PREFILLED_SYRINGE | INTRAVENOUS | Status: AC
  Filled 2021-02-02: qty 5

## 2021-02-02 MED ORDER — ONDANSETRON HCL 4 MG/2ML IJ SOLN: 4.0000 mg | Freq: Once | INTRAMUSCULAR | Status: DC | PRN

## 2021-02-02 MED ORDER — FENTANYL CITRATE PF 50 MCG/ML IJ SOSY
25.0000 ug | PREFILLED_SYRINGE | INTRAMUSCULAR | Status: DC | PRN
  Administered 2021-02-02: 25 ug via INTRAVENOUS
  Administered 2021-02-02: 50 ug via INTRAVENOUS
  Administered 2021-02-02 (×2): 25 ug via INTRAVENOUS
  Filled 2021-02-02: qty 1

## 2021-02-02 MED ORDER — MIDAZOLAM HCL 5 MG/5ML IJ SOLN
INTRAMUSCULAR | Status: DC | PRN
  Administered 2021-02-02: 2 mg via INTRAVENOUS

## 2021-02-02 MED ORDER — DEXMEDETOMIDINE (PRECEDEX) IN NS 20 MCG/5ML (4 MCG/ML) IV SYRINGE
PREFILLED_SYRINGE | INTRAVENOUS | Status: DC | PRN
  Administered 2021-02-02: 12 ug via INTRAVENOUS

## 2021-02-02 MED ORDER — PHENYLEPHRINE HCL (PRESSORS) 10 MG/ML IV SOLN
INTRAVENOUS | Status: DC | PRN
  Administered 2021-02-02 (×2): 100 ug via INTRAVENOUS

## 2021-02-02 MED ORDER — CEFAZOLIN SODIUM-DEXTROSE 2-4 GM/100ML-% IV SOLN
2.0000 g | INTRAVENOUS | Status: AC
  Administered 2021-02-02: 2 g via INTRAVENOUS
  Filled 2021-02-02: qty 100

## 2021-02-02 MED ORDER — MIDAZOLAM HCL 2 MG/2ML IJ SOLN
INTRAMUSCULAR | Status: AC
  Filled 2021-02-02: qty 2

## 2021-02-02 MED ORDER — PROPOFOL 10 MG/ML IV BOLUS
INTRAVENOUS | Status: AC
  Filled 2021-02-02: qty 20

## 2021-02-02 MED ORDER — HYDROMORPHONE HCL 2 MG PO TABS: 2.0000 mg | ORAL_TABLET | Freq: Two times a day (BID) | ORAL | 0 refills | Status: AC | PRN

## 2021-02-02 MED ORDER — WATER FOR IRRIGATION, STERILE IR SOLN
Status: DC | PRN
  Administered 2021-02-02: 1000 mL

## 2021-02-02 MED ORDER — LIDOCAINE-EPINEPHRINE (PF) 1 %-1:200000 IJ SOLN
INTRAMUSCULAR | Status: AC
  Filled 2021-02-02: qty 30

## 2021-02-02 MED ORDER — CHLORHEXIDINE GLUCONATE 0.12 % MT SOLN
15.0000 mL | Freq: Once | OROMUCOSAL | Status: AC
  Administered 2021-02-02: 15 mL via OROMUCOSAL

## 2021-02-02 MED ORDER — LACTATED RINGERS IV SOLN
INTRAVENOUS | Status: DC
  Administered 2021-02-02: 1000 mL via INTRAVENOUS

## 2021-02-02 SURGICAL SUPPLY — 40 items
ADH SKN CLS APL DERMABOND .7 (GAUZE/BANDAGES/DRESSINGS) ×1
APL PRP STRL LF DISP 70% ISPRP (MISCELLANEOUS) ×1
CABLE EXTEN 4.32 INTERSTIM (NEUROSURGERY SUPPLIES) ×1 IMPLANT
CHLORAPREP W/TINT 26 (MISCELLANEOUS) ×2 IMPLANT
COVER LIGHT HANDLE STERIS (MISCELLANEOUS) ×4 IMPLANT
DERMABOND ADVANCED (GAUZE/BANDAGES/DRESSINGS) ×1
DERMABOND ADVANCED .7 DNX12 (GAUZE/BANDAGES/DRESSINGS) ×1 IMPLANT
DRAPE C-ARM FOLDED MOBILE STRL (DRAPES) ×2 IMPLANT
DRAPE HALF SHEET 40X57 (DRAPES) ×2 IMPLANT
DRAPE INCISE IOBAN 66X45 STRL (DRAPES) ×2 IMPLANT
DRAPE LAPAROSCOPIC ABDOMINAL (DRAPES) ×2 IMPLANT
DRSG TEGADERM 4X4.75 (GAUZE/BANDAGES/DRESSINGS) ×2 IMPLANT
DRSG TELFA 3X8 NADH (GAUZE/BANDAGES/DRESSINGS) ×2 IMPLANT
ELECT REM PT RETURN 9FT ADLT (ELECTROSURGICAL) ×2
ELECTRODE REM PT RTRN 9FT ADLT (ELECTROSURGICAL) ×1 IMPLANT
GLOVE SURG POLYISO LF SZ8 (GLOVE) ×2 IMPLANT
GLOVE SURG UNDER POLY LF SZ7 (GLOVE) ×4 IMPLANT
GOWN STRL REUS W/TWL LRG LVL3 (GOWN DISPOSABLE) ×2 IMPLANT
GOWN STRL REUS W/TWL XL LVL3 (GOWN DISPOSABLE) ×2 IMPLANT
KIT TURNOVER CYSTO (KITS) ×2 IMPLANT
LEAD INTERSTIM 4.32 28 L (Lead) ×1 IMPLANT
MANIFOLD NEPTUNE II (INSTRUMENTS) IMPLANT
NDL HYPO 21X1.5 SAFETY (NEEDLE) ×1 IMPLANT
NEEDLE HYPO 21X1.5 SAFETY (NEEDLE) ×2 IMPLANT
NEUROSTIMULATOR 1.7X2X.06 (UROLOGICAL SUPPLIES) ×2 IMPLANT
PACK MINOR (CUSTOM PROCEDURE TRAY) ×2 IMPLANT
PAD ARMBOARD 7.5X6 YLW CONV (MISCELLANEOUS) ×2 IMPLANT
PAD DRESSING TELFA 3X8 NADH (GAUZE/BANDAGES/DRESSINGS) ×1 IMPLANT
SET BASIN LINEN APH (SET/KITS/TRAYS/PACK) ×2 IMPLANT
SPONGE GAUZE 2X2 8PLY STRL LF (GAUZE/BANDAGES/DRESSINGS) ×8 IMPLANT
SUT MNCRL AB 4-0 PS2 18 (SUTURE) ×2 IMPLANT
SUT SILK 2 0 (SUTURE) ×2
SUT SILK 2-0 18XBRD TIE 12 (SUTURE) IMPLANT
SUT SILK 3 0 (SUTURE)
SUT SILK 3-0 FS1 18XBRD (SUTURE) IMPLANT
SUT VIC AB 3-0 SH 27 (SUTURE) ×2
SUT VIC AB 3-0 SH 27X BRD (SUTURE) ×1 IMPLANT
SYR BULB IRRIG 60ML STRL (SYRINGE) ×2 IMPLANT
SYR CONTROL 10ML LL (SYRINGE) ×2 IMPLANT
WATER STERILE IRR 500ML POUR (IV SOLUTION) ×2 IMPLANT

## 2021-02-02 NOTE — Anesthesia Preprocedure Evaluation (Addendum)
Anesthesia Evaluation  Patient identified by MRN, date of birth, ID band Patient confused    Reviewed: Allergy & Precautions, NPO status , Patient's Chart, lab work & pertinent test results  History of Anesthesia Complications (+) PONV and history of anesthetic complications  Airway Mallampati: III  TM Distance: >3 FB Neck ROM: Full   Comment: Cervical spinal stenosis, C 6-7 sx Dental  (+) Dental Advisory Given, Lower Dentures, Upper Dentures   Pulmonary shortness of breath and with exertion, asthma , sleep apnea , COPD,  COPD inhaler, former smoker,    Pulmonary exam normal breath sounds clear to auscultation       Cardiovascular Exercise Tolerance: Good Normal cardiovascular exam Rhythm:Regular Rate:Normal     Neuro/Psych  Headaches, Anxiety  Neuromuscular disease (Cervical radiculopathy)    GI/Hepatic GERD  Medicated and Controlled,  Endo/Other  Hypothyroidism   Renal/GU      Musculoskeletal  (+) Arthritis , Osteoarthritis,    Abdominal   Peds  Hematology  (+) anemia ,   Anesthesia Other Findings Cervical spinal stenosis , NECK SX Back pain  Reproductive/Obstetrics                           Anesthesia Physical Anesthesia Plan  ASA: 3  Anesthesia Plan: MAC   Post-op Pain Management:    Induction:   PONV Risk Score and Plan: TIVA  Airway Management Planned: Nasal Cannula and Natural Airway  Additional Equipment:   Intra-op Plan:   Post-operative Plan:   Informed Consent: I have reviewed the patients History and Physical, chart, labs and discussed the procedure including the risks, benefits and alternatives for the proposed anesthesia with the patient or authorized representative who has indicated his/her understanding and acceptance.     Dental advisory given  Plan Discussed with: CRNA and Surgeon  Anesthesia Plan Comments:        Anesthesia Quick Evaluation

## 2021-02-02 NOTE — Progress Notes (Signed)
Telephone call placed to Dr. Ronne Binning by Waynard Edwards RN.  Per Britta Mccreedy, Dr. Ronne Binning wants patient to have bladder scan and insert foley catheter with follow up appointment at [facility] on Friday February 03, 2021.  Per [facility] PACU nurse , residual urine from bladder scan over 425cc.

## 2021-02-02 NOTE — Discharge Instructions (Signed)
PLEASE REPORT TO DR. Macario Carls OFFICE AT 9AM IN [place] OFFICE TO DISCUSS DEVICE AND FOLEY CATHETER

## 2021-02-02 NOTE — Transfer of Care (Signed)
Immediate Anesthesia Transfer of Care Note  Patient: [name]  Procedure(s) Performed: Barrie Lyme IMPLANT FIRST STAGE LEFT SACRUM   Patient Location: PACU  Anesthesia Type:MAC  Level of Consciousness: awake, alert , oriented and patient cooperative  Airway & Oxygen Therapy: Patient Spontanous Breathing  Post-op Assessment: Report given to RN, Post -op Vital signs reviewed and stable and Patient moving all extremities X 4  Post vital signs: Reviewed and stable  Last Vitals:  Vitals Value Taken Time  BP    Temp    Pulse    Resp    SpO2      Last Pain:  Vitals:   02/02/21 1317  TempSrc: Oral  PainSc: 0-No pain      Patients Stated Pain Goal: 8 (02/72/53 6644)  Complications: No notable events documented.

## 2021-02-02 NOTE — H&P (Signed)
Urology Admission H&P  Chief Complaint: urge incontinence  History of Present Illness: [name] is 55WC here for followup for urge incontinence and OAB. She was prescribed gemtesa 75mg  last visit which failed to improve her urgency, urge incontinence and urinary frequency. She uses 4-6 pads per day which are soaked. She is very unhappy with her urinary symptoms. No other complaints today.   Past Medical History:  Diagnosis Date   Anemia    Anxiety    Arthritis    Asthma    Cervical radiculopathy    Cluster headaches    COPD (chronic obstructive pulmonary disease) (HCC)    COPD (chronic obstructive pulmonary disease) with chronic bronchitis (HCC)    GERD (gastroesophageal reflux disease)    Hyperlipemia    Hypoglycemia    Hypothyroidism    Migraine    Pneumothorax    PONV (postoperative nausea and vomiting)    Pre-diabetes    Rectal discharge 07/28/2010   Shortness of breath    Sleep apnea    cannot tolerate-not using CPAP   Sluggishness 07/28/2010   Past Surgical History:  Procedure Laterality Date   ABDOMINAL HYSTERECTOMY     with bladder suspension   CERVICAL FUSION     CHOLECYSTECTOMY     COLONOSCOPY N/A 07/30/2013   Procedure: COLONOSCOPY;  Surgeon: 08/01/2013, MD;  [facility];  Service: Endoscopy;  Laterality: N/A;  930   EXAM UNDER ANESTHESIA WITH MANIPULATION OF SHOULDER Right 10/11/2016   Procedure: EXAM UNDER ANESTHESIA WITH MANIPULATION OF SHOULDER;  Surgeon: 12/12/2016, MD;  Location: [facility];  Service: Orthopedics;  Laterality: Right;   HERNIA REPAIR     INCISIONAL HERNIA REPAIR N/A 07/30/2016   Procedure: HERNIA REPAIR INCISIONAL WITH MESH;  Surgeon: 08/01/2016, MD;  Location: [facility];  Service: General;  Laterality: N/A;   JOINT REPLACEMENT     LOBECTOMY     right lung    POSTERIOR CERVICAL LAMINECTOMY Right 04/05/2017   Procedure: Right C6-7 Posterior cervical laminectomy;  Surgeon: 04/07/2017, MD;  Location: [facility] OR;  Service:  Neurosurgery;  Laterality: Right;  Right C6-7 Posterior cervical laminectomy   SHOULDER ARTHROSCOPY WITH ROTATOR CUFF REPAIR Right 10/11/2016   Procedure: SHOULDER ARTHROSCOPY;  Surgeon: 12/12/2016, MD;  Location: [facility];  Service: Orthopedics;  Laterality: Right;   TOTAL HIP ARTHROPLASTY Right 04/15/2020   Procedure: RIGHT TOTAL HIP ARTHROPLASTY ANTERIOR APPROACH;  Surgeon: 06/13/2020, MD;  Location: [facility];  Service: Orthopedics;  Laterality: Right;   TOTAL HIP ARTHROPLASTY Left 07/22/2020   Procedure: LEFT  HIP ARTHROPLASTY ANTERIOR APPROACH;  Surgeon: 07/24/2020, MD;  Location: [facility];  Service: Orthopedics;  Laterality: Left;  RNFA   TUBAL LIGATION      Home Medications:  Current Facility-Administered Medications  Medication Dose Route Frequency Provider Last Rate Last Admin   ceFAZolin (ANCEF) IVPB 2g/100 mL premix  2 g Intravenous 30 min Pre-Op Jenniger Figiel, Kathryne Hitch, MD       lactated ringers infusion   Intravenous Continuous Mardene Celeste C, MD 50 mL/hr at 02/02/21 1335 1,000 mL at 02/02/21 1335   Allergies:  Allergies  Allergen Reactions   Aspirin Shortness Of Breath    Causes asthma flares    Penicillins Other (See Comments)    UNSPECIFIED REACTION OF CHILDHOOD  Has patient had a PCN reaction causing immediate rash, facial/tongue/throat swelling, SOB or lightheadedness with hypotension: NO Has patient had a PCN reaction causing severe rash involving mucus  membranes or skin necrosis: NO Has patient had a PCN reaction that required hospitalization: no Has patient had a PCN reaction occurring within the last 10 years: NO If all of the above answers are "NO", then may proceed with Cephalosporin use.    Percocet [Oxycodone-Acetaminophen]     migraines   Tramadol     insomnia   Vicodin [Hydrocodone-Acetaminophen] Other (See Comments)    Headaches     Family History  Problem Relation Age of Onset   COPD Mother    Diabetes Mother     Hyperlipidemia Mother    Hypertension Mother    Bipolar disorder Son    Heart failure Maternal Grandmother    Hypertension Maternal Grandmother    Thyroid disease Maternal Grandmother    Diabetes Paternal Grandmother    Alzheimer's disease Paternal Grandmother    Heart failure Paternal Grandfather    Hypertension Paternal Grandfather    Hypertension Father    Colon cancer Neg Hx    Social History:  reports that she quit smoking about 12 years ago. Her smoking use included cigarettes. She has a 30.00 pack-year smoking history. She has never used smokeless tobacco. She reports that she does not drink alcohol and does not use drugs.  Review of Systems  Genitourinary:  Positive for frequency and urgency.  All other systems reviewed and are negative.  Physical Exam:  Vital signs in last 24 hours: Temp:  [97.1 F (36.2 C)] 97.1 F (36.2 C) (10/27 1317) Resp:  [16] 16 (10/27 1317) BP: (91)/(50) 91/50 (10/27 1317) SpO2:  [100 %] 100 % (10/27 1317) Physical Exam Vitals reviewed.  Constitutional:      Appearance: Normal appearance.  HENT:     Head: Normocephalic and atraumatic.     Mouth/Throat:     Mouth: Mucous membranes are dry.  Eyes:     Extraocular Movements: Extraocular movements intact.     Pupils: Pupils are equal, round, and reactive to light.  Cardiovascular:     Rate and Rhythm: Normal rate and regular rhythm.  Pulmonary:     Effort: Pulmonary effort is normal. No respiratory distress.  Abdominal:     General: Abdomen is flat. There is no distension.  Musculoskeletal:        General: No swelling. Normal range of motion.     Cervical back: Normal range of motion and neck supple.  Skin:    General: Skin is warm and dry.  Neurological:     General: No focal deficit present.     Mental Status: She is alert and oriented to person, place, and time.  Psychiatric:        Mood and Affect: Mood normal.        Behavior: Behavior normal.        Thought Content: Thought  content normal.        Judgment: Judgment normal.    Laboratory Data:  No results found for this or any previous visit (from the past 24 hour(s)). No results found for this or any previous visit (from the past 240 hour(s)). Creatinine: No results for input(s): CREATININE in the last 168 hours. Baseline Creatinine:   Impression/Assessment:  55yo with urge urinary incontinence related to OAB  Plan:  We discussed intravesical botox, PTNS, and interstim. After discussing the options the patient elects for interstim placement. Risks/benefits/alternatives discussed  Wilkie Aye 02/02/2021, 1:50 PM

## 2021-02-02 NOTE — Anesthesia Postprocedure Evaluation (Signed)
Anesthesia Post Note  Patient: [name]  Procedure(s) Performed: Barrie Lyme IMPLANT FIRST STAGE LEFT SACRUM   Patient location during evaluation: PACU Anesthesia Type: MAC Level of consciousness: awake and alert and oriented Pain management: pain level controlled Vital Signs Assessment: post-procedure vital signs reviewed and stable Respiratory status: spontaneous breathing, nonlabored ventilation and respiratory function stable Cardiovascular status: stable and blood pressure returned to baseline Postop Assessment: no apparent nausea or vomiting Anesthetic complications: no   No notable events documented.   Last Vitals:  Vitals:   02/02/21 1519 02/02/21 1530  BP: 130/90 (!) 99/57  Pulse: 81 84  Resp: 10 14  Temp: 36.7 C   SpO2: 99% 99%    Last Pain:  Vitals:   02/02/21 1317  TempSrc: Oral  PainSc: 0-No pain                 Benay Pomeroy C Adisa Vigeant

## 2021-02-02 NOTE — Op Note (Signed)
Preoperative diagnosis: Overactive bladder, urge incontinence  Postoperative diagnosis: Same  Procedure: Placement of InterStim stage I and impedance check  Surgeon: Dr. Wilkie Aye  Assistant: None  Antibiotics: Ancef  Drains: None  Indications: The patient is a 55yo with a hx of neurogenic bladder and unobstructed urinary retention. After discussing treatment options she has elected to proceed with Interstim Stage 1 implantation  Procedure in detail: Prior to the procedure consent was obtained. The patient was brought to the operating room and a brief time out was completed to ensure correct patient, correct procedure and correct site. Preoperative antibiotics were given. Extra care was taken positioning the patient in a prone position. Usual skin preparation was utilized.  Using lateral and AP fluoroscopy I marked S3. After several minutes of testing I was in the S4 foramina with a bellows response and the foramina was below the bone knuckle noted on x-ray. Eventually I was in S3 on the patient's left side above the knuckle.  Approximate 12 mL of a lidocaine epinephrine mixture was utilized in the midline. Bony table was anesthetized. The 5 inch foramen needle was introduced into the S3 foramina as noted above. At very low setting she had an excellent bellows and toe response  The inner aspect of the foramen needle was removed and the guide was placed to the appropriate depth removing the framing needle. Under fluoroscopic guidance after making a 1 cm incision the white trocar was passed to the appropriate depth. The lead with a hockey stick bend was placed to the appropriate depth just bridging the bone medially. She had excellent bellows and toe response at all 4 settings.  Under fluoroscopic guidance the inner aspect of the lead was removed. X-rays were taken.  A 2 cm left upper mid buttock incision was made. 10 mL of a lidocaine epinephrine mixture were utilized. It was carried  down to appropriate depth and a made an appropriate sized pocket.  With the passer I passed the lead from medial to lateral and connected. We then passed the lead across the midline and to the right upper buttock. The leas was then brought through this incision. The incisions were then closed with running 3-0 vicryl.  Impedance check was done utilizing sterile technique and was normal in all 4 positions We then placed dermabond on the incisions and this concluded the procedure which was well tolerated by the patient.  Complications: none  Condition: stable, transferred to PACU  Plan: The patient is to be discharged home after she voids in the PACU. If she cannot void the foley will be replaced and she will followup in 2-3 days for a second voiding trial. If she is able to void she will keep a voiding diary and will be reassessed in 1 week for response.Preoperative diagnosis: Unobstructed Urinary retnetion

## 2021-02-03 ENCOUNTER — Encounter (HOSPITAL_COMMUNITY): Payer: Self-pay | Admitting: Urology

## 2021-02-03 ENCOUNTER — Ambulatory Visit (INDEPENDENT_AMBULATORY_CARE_PROVIDER_SITE_OTHER): Payer: Medicare Other

## 2021-02-03 ENCOUNTER — Other Ambulatory Visit: Payer: Self-pay

## 2021-02-03 DIAGNOSIS — R32 Unspecified urinary incontinence: Secondary | ICD-10-CM | POA: Diagnosis not present

## 2021-02-03 NOTE — Progress Notes (Signed)
Fill and Pull Catheter Removal  Patient is present today for a catheter removal.  Patient was cleaned and prepped in a sterile fashion 24ml of sterile water/ saline was instilled into the bladder when the patient felt the urge to urinate. of water was then drained from the balloon.  A 16FR foley cath was removed from the bladder no complications were noted .  Patient as then given some time to void on their own.  Patient can void  on their own after some time.  Patient tolerated well.  Performed by: Marchelle Folks RN  Follow up/ Additional notes: patient to keep post op appt with MD

## 2021-02-03 NOTE — Patient Instructions (Signed)
Judeth Cornfield with [employer]  [phone]

## 2021-02-08 ENCOUNTER — Other Ambulatory Visit: Payer: Self-pay

## 2021-02-08 ENCOUNTER — Encounter: Payer: Self-pay | Admitting: Urology

## 2021-02-08 ENCOUNTER — Ambulatory Visit (INDEPENDENT_AMBULATORY_CARE_PROVIDER_SITE_OTHER): Payer: Medicare Other | Admitting: Urology

## 2021-02-08 VITALS — BP 106/68 | HR 88 | Temp 98.3°F

## 2021-02-08 DIAGNOSIS — R32 Unspecified urinary incontinence: Secondary | ICD-10-CM

## 2021-02-08 NOTE — Telephone Encounter (Signed)
Per Valinda Party at [facility] [phone]), she contacted [facility] [place] office and was told that the urodynamics study did not need to be repeated. (Patient completed the urodynamics study, but they lost power before the data could be saved and reported.)

## 2021-02-08 NOTE — Progress Notes (Signed)
Urological Symptom Review  Patient is experiencing the following symptoms: Hard to postpone urination Get up at night to urinate Leakage of urine   Review of Systems  Gastrointestinal (upper)  : Indigestion/heartburn  Gastrointestinal (lower) : Constipation  Constitutional : Night Sweats Fatigue  Skin: Negative for skin symptoms  Eyes: Negative for eye symptoms  Ear/Nose/Throat : Negative for Ear/Nose/Throat symptoms  Hematologic/Lymphatic: Negative for Hematologic/Lymphatic symptoms  Cardiovascular : Negative for cardiovascular symptoms  Respiratory : Negative for respiratory symptoms  Endocrine: Negative for endocrine symptoms  Musculoskeletal: Back pain Joint pain  Neurological: Negative for neurological symptoms  Psychologic: Negative for psychiatric symptoms

## 2021-02-08 NOTE — H&P (View-Only) (Signed)
02/08/2021 1:41 PM   [name] 1966/03/26 [id number]  Referring provider: Celene Squibb, MD [address],  [place] [address]  Followup urge incontinence   HPI: [name] is a 55yo here for followup for urge incontinence. She underwent Stage 1 interstim last week. She has noted her urgency and urge incontinence have significantly decreased. She has 1-2 urge incontinence episodes daily versus 7 before interstim. She is aware she needs to urinate.   PMH: Past Medical History:  Diagnosis Date   Anemia    Anxiety    Arthritis    Asthma    Cervical radiculopathy    Cluster headaches    COPD (chronic obstructive pulmonary disease) (HCC)    COPD (chronic obstructive pulmonary disease) with chronic bronchitis (HCC)    GERD (gastroesophageal reflux disease)    Hyperlipemia    Hypoglycemia    Hypothyroidism    Migraine    Pneumothorax    PONV (postoperative nausea and vomiting)    Pre-diabetes    Rectal discharge 07/28/2010   Shortness of breath    Sleep apnea    cannot tolerate-not using CPAP   Sluggishness 07/28/2010    Surgical History: Past Surgical History:  Procedure Laterality Date   ABDOMINAL HYSTERECTOMY     with bladder suspension   CERVICAL FUSION     CHOLECYSTECTOMY     COLONOSCOPY N/A 07/30/2013   Procedure: COLONOSCOPY;  Surgeon: Rogene Houston, MD;  [facility];  Service: Endoscopy;  Laterality: N/A;  [place] OF SHOULDER Right 10/11/2016   Procedure: EXAM UNDER ANESTHESIA WITH MANIPULATION OF SHOULDER;  Surgeon: Carole Civil, MD;  Location: [facility];  Service: Orthopedics;  Laterality: Right;   HERNIA REPAIR     INCISIONAL HERNIA REPAIR N/A 07/30/2016   Procedure: HERNIA REPAIR INCISIONAL WITH MESH;  Surgeon: Aviva Signs, MD;  Location: [facility];  Service: General;  Laterality: N/A;   INTERSTIM IMPLANT PLACEMENT  02/02/2021   Procedure: Barrie Lyme IMPLANT FIRST STAGE LEFT SACRUM  ;  Surgeon: Cleon Gustin, MD;  Location: [facility];  Service: Urology;;   JOINT REPLACEMENT     LOBECTOMY     right lung    POSTERIOR CERVICAL LAMINECTOMY Right 04/05/2017   Procedure: Right C6-7 Posterior cervical laminectomy;  Surgeon: Kary Kos, MD;  Location: [place];  Service: Neurosurgery;  Laterality: Right;  Right C6-7 Posterior cervical laminectomy   SHOULDER ARTHROSCOPY WITH ROTATOR CUFF REPAIR Right 10/11/2016   Procedure: SHOULDER ARTHROSCOPY;  Surgeon: Carole Civil, MD;  Location: [facility];  Service: Orthopedics;  Laterality: Right;   TOTAL HIP ARTHROPLASTY Right 04/15/2020   Procedure: RIGHT TOTAL HIP ARTHROPLASTY ANTERIOR APPROACH;  Surgeon: Mcarthur Rossetti, MD;  [facility];  Service: Orthopedics;  Laterality: Right;   TOTAL HIP ARTHROPLASTY Left 07/22/2020   Procedure: LEFT  HIP ARTHROPLASTY ANTERIOR APPROACH;  Surgeon: Mcarthur Rossetti, MD;  [facility];  Service: Orthopedics;  Laterality: Left;  RNFA   TUBAL LIGATION      Home Medications:  Allergies as of 02/08/2021       Reactions   Aspirin Shortness Of Breath   Causes asthma flares    Penicillins Other (See Comments)   UNSPECIFIED REACTION OF CHILDHOOD Has patient had a PCN reaction causing immediate rash, facial/tongue/throat swelling, SOB or lightheadedness with hypotension: NO Has patient had a PCN reaction causing severe rash involving mucus membranes or skin necrosis: NO Has patient had  a PCN reaction that required hospitalization: no Has patient had a PCN reaction occurring within the last 10 years: NO If all of the above answers are "NO", then may proceed with Cephalosporin use.   Percocet [oxycodone-acetaminophen]    migraines   Tramadol    insomnia   Vicodin [hydrocodone-acetaminophen] Other (See Comments)   Headaches         Medication List        Accurate as of February 08, 2021  1:41 PM. If you have any questions, ask your nurse or doctor.           acetaminophen 500 MG tablet Commonly known as: TYLENOL Take 1,000 mg by mouth every 6 (six) hours as needed for moderate pain.   acetaminophen-codeine 300-30 MG tablet Commonly known as: TYLENOL #3 Take 1-2 tablets by mouth every 4 (four) hours as needed for moderate pain.   AeroChamber Plus inhaler 1 each by Other route as needed for other (Use with inhalers). Use as instructed   albuterol 108 (90 Base) MCG/ACT inhaler Commonly known as: VENTOLIN HFA Inhale 2 puffs into the lungs every 4 (four) hours as needed for shortness of breath.   aspirin-acetaminophen-caffeine 250-250-65 MG tablet Commonly known as: EXCEDRIN MIGRAINE Take 2 tablets by mouth every 8 (eight) hours as needed for migraine or headache.   calcium carbonate 750 MG chewable tablet Commonly known as: TUMS EX Chew 2 tablets by mouth daily as needed for heartburn.   cetirizine 10 MG tablet Commonly known as: ZYRTEC Take 10 mg by mouth daily.   docusate sodium 100 MG capsule Commonly known as: COLACE Take 100-200 mg by mouth daily as needed for mild constipation.   famotidine 20 MG tablet Commonly known as: PEPCID Take 20 mg by mouth daily as needed for heartburn or indigestion.   fluticasone 220 MCG/ACT inhaler Commonly known as: Flovent HFA Inhale 2 puffs into the lungs 2 (two) times daily.   gabapentin 100 MG capsule Commonly known as: NEURONTIN Take 2 capsules (200 mg total) by mouth 3 (three) times daily. What changed:  when to take this reasons to take this   levothyroxine 75 MCG tablet Commonly known as: SYNTHROID Take 0.5 tablets (37.5 mcg total) by mouth daily before breakfast.   LORazepam 1 MG tablet Commonly known as: ATIVAN Take 1.5 mg by mouth 2 (two) times daily.   methocarbamol 500 MG tablet Commonly known as: ROBAXIN Take 1 tablet (500 mg total) by mouth 3 (three) times daily.   montelukast 10 MG tablet Commonly known as: SINGULAIR Take 1 tablet (10 mg total) by mouth at  bedtime.   multivitamin tablet Take 1 tablet by mouth daily.   NASACORT ALLERGY 24HR NA Place 2 sprays into the nose daily as needed (allergies).   OneTouch Delica Plus 0000000 Misc Apply topically.   pantoprazole 40 MG tablet Commonly known as: PROTONIX Take 30- 60 min before your first and last meals of the day What changed:  how much to take how to take this when to take this   pravastatin 40 MG tablet Commonly known as: PRAVACHOL Take 40 mg by mouth at bedtime.   predniSONE 10 MG tablet Commonly known as: DELTASONE Take 1 tablet by mouth once daily   Arlyce Harman PD Devi 1 applicator by Does not apply route as needed.   Stiolto Respimat 2.5-2.5 MCG/ACT Aers Generic drug: Tiotropium Bromide-Olodaterol Inhale 2 puffs into the lungs daily.   sulfamethoxazole-trimethoprim 800-160 MG tablet Commonly known as: BACTRIM DS Take 1 tablet  by mouth 2 (two) times daily.   SUMAtriptan 6 MG/0.5ML Soaj Inject 6 mg as directed daily as needed (migraine).   valACYclovir 500 MG tablet Commonly known as: VALTREX Take 500-2,000 mg by mouth See admin instructions. Take 2000mg s at first sign of fever blister outbreak then take 500mg s daily until gone   Vitamin D3 Super Strength 50 MCG (2000 UT) Caps Generic drug: Cholecalciferol Take 2,000 Units by mouth daily.        Allergies:  Allergies  Allergen Reactions   Aspirin Shortness Of Breath    Causes asthma flares    Penicillins Other (See Comments)    UNSPECIFIED REACTION OF CHILDHOOD  Has patient had a PCN reaction causing immediate rash, facial/tongue/throat swelling, SOB or lightheadedness with hypotension: NO Has patient had a PCN reaction causing severe rash involving mucus membranes or skin necrosis: NO Has patient had a PCN reaction that required hospitalization: no Has patient had a PCN reaction occurring within the last 10 years: NO If all of the above answers are "NO", then may proceed with Cephalosporin use.     Percocet [Oxycodone-Acetaminophen]     migraines   Tramadol     insomnia   Vicodin [Hydrocodone-Acetaminophen] Other (See Comments)    Headaches     Family History: Family History  Problem Relation Age of Onset   COPD Mother    Diabetes Mother    Hyperlipidemia Mother    Hypertension Mother    Bipolar disorder Son    Heart failure Maternal Grandmother    Hypertension Maternal Grandmother    Thyroid disease Maternal Grandmother    Diabetes Paternal Grandmother    Alzheimer's disease Paternal Grandmother    Heart failure Paternal Grandfather    Hypertension Paternal Grandfather    Hypertension Father    Colon cancer Neg Hx     Social History:  reports that she quit smoking about 12 years ago. Her smoking use included cigarettes. She has a 30.00 pack-year smoking history. She has never used smokeless tobacco. She reports that she does not drink alcohol and does not use drugs.  ROS: All other review of systems were reviewed and are negative except what is noted above in HPI  Physical Exam: BP 106/68   Pulse 88   Temp 98.3 F (36.8 C)   Constitutional:  Alert and oriented, No acute distress. HEENT: [place] AT, moist mucus membranes.  Trachea midline, no masses. Cardiovascular: No clubbing, cyanosis, or edema. Respiratory: Normal respiratory effort, no increased work of breathing. GI: Abdomen is soft, nontender, nondistended, no abdominal masses GU: No CVA tenderness.  Lymph: No cervical or inguinal lymphadenopathy. Skin: No rashes, bruises or suspicious lesions. Neurologic: Grossly intact, no focal deficits, moving all 4 extremities. Psychiatric: Normal mood and affect.  Laboratory Data: Lab Results  Component Value Date   WBC 11.2 (H) 01/31/2021   HGB 10.8 (L) 01/31/2021   HCT 34.6 (L) 01/31/2021   MCV 92.5 01/31/2021   PLT 320 01/31/2021    Lab Results  Component Value Date   CREATININE 0.93 07/23/2020    No results found for: PSA  No results found for:  TESTOSTERONE  Lab Results  Component Value Date   HGBA1C 6.0 (H) 07/13/2020    Urinalysis    Component Value Date/Time   APPEARANCEUR Clear 11/09/2020 1333   GLUCOSEU Negative 11/09/2020 1333   BILIRUBINUR Negative 11/09/2020 1333   PROTEINUR Negative 11/09/2020 1333   NITRITE Positive (A) 11/09/2020 1333   LEUKOCYTESUR Negative 11/09/2020 1333  Lab Results  Component Value Date   LABMICR See below: 11/09/2020   WBCUA 6-10 (A) 11/09/2020   LABEPIT 0-10 11/09/2020   BACTERIA Many (A) 11/09/2020    Pertinent Imaging:  Results for orders placed during the hospital encounter of 06/11/19  DG Abd 1 View  Narrative CLINICAL DATA:  Partial intestinal obstruction.  EXAM: ABDOMEN - 1 VIEW  COMPARISON:  None.  FINDINGS: Bowel gas pattern is normal without evidence of ileus or obstruction on this single view. No abnormal calcifications or acute bone findings.  IMPRESSION: Unremarkable one-view abdominal radiograph.   Electronically Signed By: Paulina Fusi M.D. On: 06/11/2019 15:39  No results found for this or any previous visit.  No results found for this or any previous visit.  No results found for this or any previous visit.  No results found for this or any previous visit.  No results found for this or any previous visit.  No results found for this or any previous visit.  No results found for this or any previous visit.   Assessment & Plan:    1. Urinary incontinence, unspecified type We will proceed with Stage 2 interstim.   No follow-ups on file.  Wilkie Aye, MD  [facility] [place]

## 2021-02-08 NOTE — Progress Notes (Signed)
02/08/2021 1:41 PM   [name] [birth year] [id number]  Referring provider: Celene Squibb, MD [address],  [place] [address]  Followup urge incontinence   HPI: Mr [name] is a 55yo here for followup for urge incontinence. She underwent Stage 1 interstim last week. She has noted her urgency and urge incontinence have significantly decreased. She has 1-2 urge incontinence episodes daily versus 7 before interstim. She is aware she needs to urinate.   PMH: Past Medical History:  Diagnosis Date   Anemia    Anxiety    Arthritis    Asthma    Cervical radiculopathy    Cluster headaches    COPD (chronic obstructive pulmonary disease) (HCC)    COPD (chronic obstructive pulmonary disease) with chronic bronchitis (HCC)    GERD (gastroesophageal reflux disease)    Hyperlipemia    Hypoglycemia    Hypothyroidism    Migraine    Pneumothorax    PONV (postoperative nausea and vomiting)    Pre-diabetes    Rectal discharge 07/28/2010   Shortness of breath    Sleep apnea    cannot tolerate-not using CPAP   Sluggishness 07/28/2010    Surgical History: Past Surgical History:  Procedure Laterality Date   ABDOMINAL HYSTERECTOMY     with bladder suspension   CERVICAL FUSION     CHOLECYSTECTOMY     COLONOSCOPY N/A 07/30/2013   Procedure: COLONOSCOPY;  Surgeon: Rogene Houston, MD;  [facility];  Service: Endoscopy;  Laterality: N/A;  [place] OF SHOULDER Right 10/11/2016   Procedure: EXAM UNDER ANESTHESIA WITH MANIPULATION OF SHOULDER;  Surgeon: Carole Civil, MD;  Location: [facility];  Service: Orthopedics;  Laterality: Right;   HERNIA REPAIR     INCISIONAL HERNIA REPAIR N/A 07/30/2016   Procedure: HERNIA REPAIR INCISIONAL WITH MESH;  Surgeon: Aviva Signs, MD;  Location: [facility];  Service: General;  Laterality: N/A;   INTERSTIM IMPLANT PLACEMENT  02/02/2021   Procedure: [place]rie [place] IMPLANT FIRST STAGE LEFT SACRUM  ;  Surgeon: Cleon Gustin, MD;  Location: [facility];  Service: Urology;;   JOINT REPLACEMENT     LOBECTOMY     right lung    POSTERIOR CERVICAL LAMINECTOMY Right 04/05/2017   Procedure: Right C6-7 Posterior cervical laminectomy;  Surgeon: Kary Kos, MD;  Location: [facility];  Service: Neurosurgery;  Laterality: Right;  Right C6-7 Posterior cervical laminectomy   SHOULDER ARTHROSCOPY WITH ROTATOR CUFF REPAIR Right 10/11/2016   Procedure: SHOULDER ARTHROSCOPY;  Surgeon: Carole Civil, MD;  Location: [facility];  Service: Orthopedics;  Laterality: Right;   TOTAL HIP ARTHROPLASTY Right 04/15/2020   Procedure: RIGHT TOTAL HIP ARTHROPLASTY ANTERIOR APPROACH;  Surgeon: Mcarthur Rossetti, MD;  Location: [facility];  Service: Orthopedics;  Laterality: Right;   TOTAL HIP ARTHROPLASTY Left 07/22/2020   Procedure: LEFT  HIP ARTHROPLASTY ANTERIOR APPROACH;  Surgeon: Mcarthur Rossetti, MD;  Location: [facility];  Service: Orthopedics;  Laterality: Left;  RNFA   TUBAL LIGATION      Home Medications:  Allergies as of 02/08/2021       Reactions   Aspirin Shortness Of Breath   Causes asthma flares    Penicillins Other (See Comments)   UNSPECIFIED REACTION OF CHILDHOOD Has patient had a PCN reaction causing immediate rash, facial/tongue/throat swelling, SOB or lightheadedness with hypotension: NO Has patient had a PCN reaction causing severe rash involving mucus membranes or skin necrosis: NO Has patient had  a PCN reaction that required hospitalization: no Has patient had a PCN reaction occurring within the last 10 years: NO If all of the above answers are "NO", then may proceed with Cephalosporin use.   Percocet [oxycodone-acetaminophen]    migraines   Tramadol    insomnia   Vicodin [hydrocodone-acetaminophen] Other (See Comments)   Headaches         Medication List        Accurate as of February 08, 2021  1:41 PM. If you have any questions, ask your nurse or doctor.           acetaminophen 500 MG tablet Commonly known as: TYLENOL Take 1,000 mg by mouth every 6 (six) hours as needed for moderate pain.   acetaminophen-codeine 300-30 MG tablet Commonly known as: TYLENOL #3 Take 1-2 tablets by mouth every 4 (four) hours as needed for moderate pain.   AeroChamber Plus inhaler 1 each by Other route as needed for other (Use with inhalers). Use as instructed   albuterol 108 (90 Base) MCG/ACT inhaler Commonly known as: VENTOLIN HFA Inhale 2 puffs into the lungs every 4 (four) hours as needed for shortness of breath.   aspirin-acetaminophen-caffeine 250-250-65 MG tablet Commonly known as: EXCEDRIN MIGRAINE Take 2 tablets by mouth every 8 (eight) hours as needed for migraine or headache.   calcium carbonate 750 MG chewable tablet Commonly known as: TUMS EX Chew 2 tablets by mouth daily as needed for heartburn.   cetirizine 10 MG tablet Commonly known as: ZYRTEC Take 10 mg by mouth daily.   docusate sodium 100 MG capsule Commonly known as: COLACE Take 100-200 mg by mouth daily as needed for mild constipation.   famotidine 20 MG tablet Commonly known as: PEPCID Take 20 mg by mouth daily as needed for heartburn or indigestion.   fluticasone 220 MCG/ACT inhaler Commonly known as: Flovent HFA Inhale 2 puffs into the lungs 2 (two) times daily.   gabapentin 100 MG capsule Commonly known as: NEURONTIN Take 2 capsules (200 mg total) by mouth 3 (three) times daily. What changed:  when to take this reasons to take this   levothyroxine 75 MCG tablet Commonly known as: SYNTHROID Take 0.5 tablets (37.5 mcg total) by mouth daily before breakfast.   LORazepam 1 MG tablet Commonly known as: ATIVAN Take 1.5 mg by mouth 2 (two) times daily.   methocarbamol 500 MG tablet Commonly known as: ROBAXIN Take 1 tablet (500 mg total) by mouth 3 (three) times daily.   montelukast 10 MG tablet Commonly known as: SINGULAIR Take 1 tablet (10 mg total) by mouth at  bedtime.   multivitamin tablet Take 1 tablet by mouth daily.   NASACORT ALLERGY 24HR NA Place 2 sprays into the nose daily as needed (allergies).   OneTouch Delica Plus 0000000 Misc Apply topically.   pantoprazole 40 MG tablet Commonly known as: PROTONIX Take 30- 60 min before your first and last meals of the day What changed:  how much to take how to take this when to take this   pravastatin 40 MG tablet Commonly known as: PRAVACHOL Take 40 mg by mouth at bedtime.   predniSONE 10 MG tablet Commonly known as: DELTASONE Take 1 tablet by mouth once daily   Arlyce Harman [employer] Devi 1 applicator by Does not apply route as needed.   Stiolto Respimat 2.5-2.5 MCG/ACT Aers Generic drug: Tiotropium Bromide-Olodaterol Inhale 2 puffs into the lungs daily.   sulfamethoxazole-trimethoprim 800-160 MG tablet Commonly known as: BACTRIM DS Take 1 tablet  by mouth 2 (two) times daily.   SUMAtriptan 6 MG/0.5ML Soaj Inject 6 mg as directed daily as needed (migraine).   valACYclovir 500 MG tablet Commonly known as: VALTREX Take 500-2,000 mg by mouth See admin instructions. Take 2000mg s at first sign of fever blister outbreak then take 500mg s daily until gone   Vitamin D3 Super Strength 50 MCG (2000 UT) Caps Generic drug: Cholecalciferol Take 2,000 Units by mouth daily.        Allergies:  Allergies  Allergen Reactions   Aspirin Shortness Of Breath    Causes asthma flares    Penicillins Other (See Comments)    UNSPECIFIED REACTION OF CHILDHOOD  Has patient had a PCN reaction causing immediate rash, facial/tongue/throat swelling, SOB or lightheadedness with hypotension: NO Has patient had a PCN reaction causing severe rash involving mucus membranes or skin necrosis: NO Has patient had a PCN reaction that required hospitalization: no Has patient had a PCN reaction occurring within the last 10 years: NO If all of the above answers are "NO", then may proceed with Cephalosporin use.     Percocet [Oxycodone-Acetaminophen]     migraines   Tramadol     insomnia   Vicodin [Hydrocodone-Acetaminophen] Other (See Comments)    Headaches     Family History: Family History  Problem Relation Age of Onset   COPD Mother    Diabetes Mother    Hyperlipidemia Mother    Hypertension Mother    Bipolar disorder Son    Heart failure Maternal Grandmother    Hypertension Maternal Grandmother    Thyroid disease Maternal Grandmother    Diabetes Paternal Grandmother    Alzheimer's disease Paternal Grandmother    Heart failure Paternal Grandfather    Hypertension Paternal Grandfather    Hypertension Father    Colon cancer Neg Hx     Social History:  reports that she quit smoking about 12 years ago. Her smoking use included cigarettes. She has a 30.00 pack-year smoking history. She has never used smokeless tobacco. She reports that she does not drink alcohol and does not use drugs.  ROS: All other review of systems were reviewed and are negative except what is noted above in HPI  Physical Exam: BP 106/68   Pulse 88   Temp 98.3 F (36.8 C)   Constitutional:  Alert and oriented, No acute distress. HEENT: [place] AT, moist mucus membranes.  Trachea midline, no masses. Cardiovascular: No clubbing, cyanosis, or edema. Respiratory: Normal respiratory effort, no increased work of breathing. GI: Abdomen is soft, nontender, nondistended, no abdominal masses GU: No CVA tenderness.  Lymph: No cervical or inguinal lymphadenopathy. Skin: No rashes, bruises or suspicious lesions. Neurologic: Grossly intact, no focal deficits, moving all 4 extremities. Psychiatric: Normal mood and affect.  Laboratory Data: Lab Results  Component Value Date   WBC 11.2 (H) 01/31/2021   HGB 10.8 (L) 01/31/2021   HCT 34.6 (L) 01/31/2021   MCV 92.5 01/31/2021   PLT 320 01/31/2021    Lab Results  Component Value Date   CREATININE 0.93 07/23/2020    No results found for: PSA  No results found for:  TESTOSTERONE  Lab Results  Component Value Date   HGBA1C 6.0 (H) 07/13/2020    Urinalysis    Component Value Date/Time   APPEARANCEUR Clear 11/09/2020 1333   GLUCOSEU Negative 11/09/2020 1333   BILIRUBINUR Negative 11/09/2020 1333   PROTEINUR Negative 11/09/2020 1333   NITRITE Positive (A) 11/09/2020 1333   LEUKOCYTESUR Negative 11/09/2020 1333  Lab Results  Component Value Date   LABMICR See below: 11/09/2020   WBCUA 6-10 (A) 11/09/2020   LABEPIT 0-10 11/09/2020   BACTERIA Many (A) 11/09/2020    Pertinent Imaging:  Results for orders placed during the hospital encounter of 06/11/19  DG Abd 1 View  Narrative CLINICAL DATA:  Partial intestinal obstruction.  EXAM: ABDOMEN - 1 VIEW  COMPARISON:  None.  FINDINGS: Bowel gas pattern is normal without evidence of ileus or obstruction on this single view. No abnormal calcifications or acute bone findings.  IMPRESSION: Unremarkable one-view abdominal radiograph.   Electronically Signed By: Paulina Fusi M.D. On: 06/11/2019 15:39  No results found for this or any previous visit.  No results found for this or any previous visit.  No results found for this or any previous visit.  No results found for this or any previous visit.  No results found for this or any previous visit.  No results found for this or any previous visit.  No results found for this or any previous visit.   Assessment & Plan:    1. Urinary incontinence, unspecified type We will proceed with Stage 2 interstim.   No follow-ups on file.  Wilkie Aye, MD  [facility] [place]

## 2021-02-08 NOTE — Patient Instructions (Signed)

## 2021-02-13 ENCOUNTER — Ambulatory Visit: Payer: Medicare Other

## 2021-02-13 ENCOUNTER — Other Ambulatory Visit: Payer: Self-pay

## 2021-02-15 ENCOUNTER — Encounter: Payer: Self-pay | Admitting: Urology

## 2021-02-16 ENCOUNTER — Other Ambulatory Visit (HOSPITAL_COMMUNITY): Payer: Self-pay | Admitting: Family Medicine

## 2021-02-16 ENCOUNTER — Telehealth: Payer: Self-pay

## 2021-02-16 DIAGNOSIS — Z13820 Encounter for screening for osteoporosis: Secondary | ICD-10-CM

## 2021-02-16 DIAGNOSIS — F329 Major depressive disorder, single episode, unspecified: Secondary | ICD-10-CM | POA: Insufficient documentation

## 2021-02-16 NOTE — Telephone Encounter (Signed)
Clinicals faxed for pending PA for CPT [phone] for upcoming surgery.

## 2021-02-17 NOTE — Patient Instructions (Signed)
[name]  02/17/2021     @PREFPERIOPPHARMACY @   Your procedure is scheduled on  02/23/2021.   Report to [facility] at  1230 P.M.   Call this number if you have problems the morning of surgery:  [phone]   Remember:  Do not eat or drink after midnight.      Take these medicines the morning of surgery with A SIP OF WATER        zyrtec, pepcid, gabapentin, levothyroxine, ativan(if needed), robaxin (if needed), protonix, prednisone.     Do not wear jewelry, make-up or nail polish.  Do not wear lotions, powders, or perfumes, or deodorant.  Do not shave 48 hours prior to surgery.  Men may shave face and neck.  Do not bring valuables to the hospital.  [facility] is not responsible for any belongings or valuables.  Contacts, dentures or bridgework may not be worn into surgery.  Leave your suitcase in the car.  After surgery it may be brought to your room.  For patients admitted to the hospital, discharge time will be determined by your treatment team.  Patients discharged the day of surgery will not be allowed to drive home and must have someone with them for 24 hours.    Special instructions:   DO NOT smoke tobacco or vape for 24 hours before your procedure.  Please read over the following fact sheets that you were given. Coughing and Deep Breathing, Anesthesia Post-op Instructions, and Care and Recovery After Surgery      General Anesthesia, Adult, Care After This sheet gives you information about how to care for yourself after your procedure. Your health care provider may also give you more specific instructions. If you have problems or questions, contact your health care provider. What can I expect after the procedure? After the procedure, the following side effects are common: Pain or discomfort at the IV site. Nausea. Vomiting. Sore throat. Trouble concentrating. Feeling cold or chills. Feeling weak or tired. Sleepiness and  fatigue. Soreness and body aches. These side effects can affect parts of the body that were not involved in surgery. Follow these instructions at home: For the time period you were told by your health care provider:  Rest. Do not participate in activities where you could fall or become injured. Do not drive or use machinery. Do not drink alcohol. Do not take sleeping pills or medicines that cause drowsiness. Do not make important decisions or sign legal documents. Do not take care of children on your own. Eating and drinking Follow any instructions from your health care provider about eating or drinking restrictions. When you feel hungry, start by eating small amounts of foods that are soft and easy to digest (bland), such as toast. Gradually return to your regular diet. Drink enough fluid to keep your urine pale yellow. If you vomit, rehydrate by drinking water, juice, or clear broth. General instructions If you have sleep apnea, surgery and certain medicines can increase your risk for breathing problems. Follow instructions from your health care provider about wearing your sleep device: Anytime you are sleeping, including during daytime naps. While taking prescription pain medicines, sleeping medicines, or medicines that make you drowsy. Have a responsible adult stay with you for the time you are told. It is important to have someone help care for you until you are awake and alert. Return to your normal activities as told by your health care provider. Ask your health care provider what  activities are safe for you. Take over-the-counter and prescription medicines only as told by your health care provider. If you smoke, do not smoke without supervision. Keep all follow-up visits as told by your health care provider. This is important. Contact a health care provider if: You have nausea or vomiting that does not get better with medicine. You cannot eat or drink without vomiting. You have  pain that does not get better with medicine. You are unable to pass urine. You develop a skin rash. You have a fever. You have redness around your IV site that gets worse. Get help right away if: You have difficulty breathing. You have chest pain. You have blood in your urine or stool, or you vomit blood. Summary After the procedure, it is common to have a sore throat or nausea. It is also common to feel tired. Have a responsible adult stay with you for the time you are told. It is important to have someone help care for you until you are awake and alert. When you feel hungry, start by eating small amounts of foods that are soft and easy to digest (bland), such as toast. Gradually return to your regular diet. Drink enough fluid to keep your urine pale yellow. Return to your normal activities as told by your health care provider. Ask your health care provider what activities are safe for you. This information is not intended to replace advice given to you by your health care provider. Make sure you discuss any questions you have with your health care provider. Document Revised: 12/10/2019 Document Reviewed: 07/09/2019 Elsevier Patient Education  2022 [employer] How to Use Chlorhexidine for Bathing Chlorhexidine gluconate (CHG) is a germ-killing (antiseptic) solution that is used to clean the skin. It can get rid of the bacteria that normally live on the skin and can keep them away for about 24 hours. To clean your skin with CHG, you may be given: A CHG solution to use in the shower or as part of a sponge bath. A prepackaged cloth that contains CHG. Cleaning your skin with CHG may help lower the risk for infection: While you are staying in the intensive care unit of the hospital. If you have a vascular access, such as a central line, to provide short-term or long-term access to your veins. If you have a catheter to drain urine from your bladder. If you are on a ventilator. A ventilator is  a machine that helps you breathe by moving air in and out of your lungs. After surgery. What are the risks? Risks of using CHG include: A skin reaction. Hearing loss, if CHG gets in your ears and you have a perforated eardrum. Eye injury, if CHG gets in your eyes and is not rinsed out. The CHG product catching fire. Make sure that you avoid smoking and flames after applying CHG to your skin. Do not use CHG: If you have a chlorhexidine allergy or have previously reacted to chlorhexidine. On babies younger than 34 months of age. How to use CHG solution Use CHG only as told by your health care provider, and follow the instructions on the label. Use the full amount of CHG as directed. Usually, this is one bottle. During a shower Follow these steps when using CHG solution during a shower (unless your health care provider gives you different instructions): Start the shower. Use your normal soap and shampoo to wash your face and hair. Turn off the shower or move out of the shower stream. Pour  the CHG onto a clean washcloth. Do not use any type of brush or rough-edged sponge. Starting at your neck, lather your body down to your toes. Make sure you follow these instructions: If you will be having surgery, pay special attention to the part of your body where you will be having surgery. Scrub this area for at least 1 minute. Do not use CHG on your head or face. If the solution gets into your ears or eyes, rinse them well with water. Avoid your genital area. Avoid any areas of skin that have broken skin, cuts, or scrapes. Scrub your back and under your arms. Make sure to wash skin folds. Let the lather sit on your skin for 1-2 minutes or as long as told by your health care provider. Thoroughly rinse your entire body in the shower. Make sure that all body creases and crevices are rinsed well. Dry off with a clean towel. Do not put any substances on your body afterward--such as powder, lotion, or  perfume--unless you are told to do so by your health care provider. Only use lotions that are recommended by the manufacturer. Put on clean clothes or pajamas. If it is the night before your surgery, sleep in clean sheets.  During a sponge bath Follow these steps when using CHG solution during a sponge bath (unless your health care provider gives you different instructions): Use your normal soap and shampoo to wash your face and hair. Pour the CHG onto a clean washcloth. Starting at your neck, lather your body down to your toes. Make sure you follow these instructions: If you will be having surgery, pay special attention to the part of your body where you will be having surgery. Scrub this area for at least 1 minute. Do not use CHG on your head or face. If the solution gets into your ears or eyes, rinse them well with water. Avoid your genital area. Avoid any areas of skin that have broken skin, cuts, or scrapes. Scrub your back and under your arms. Make sure to wash skin folds. Let the lather sit on your skin for 1-2 minutes or as long as told by your health care provider. Using a different clean, wet washcloth, thoroughly rinse your entire body. Make sure that all body creases and crevices are rinsed well. Dry off with a clean towel. Do not put any substances on your body afterward--such as powder, lotion, or perfume--unless you are told to do so by your health care provider. Only use lotions that are recommended by the manufacturer. Put on clean clothes or pajamas. If it is the night before your surgery, sleep in clean sheets. How to use CHG prepackaged cloths Only use CHG cloths as told by your health care provider, and follow the instructions on the label. Use the CHG cloth on clean, dry skin. Do not use the CHG cloth on your head or face unless your health care provider tells you to. When washing with the CHG cloth: Avoid your genital area. Avoid any areas of skin that have broken  skin, cuts, or scrapes. Before surgery Follow these steps when using a CHG cloth to clean before surgery (unless your health care provider gives you different instructions): Using the CHG cloth, vigorously scrub the part of your body where you will be having surgery. Scrub using a back-and-forth motion for 3 minutes. The area on your body should be completely wet with CHG when you are done scrubbing. Do not rinse. Discard the cloth and let  the area air-dry. Do not put any substances on the area afterward, such as powder, lotion, or perfume. Put on clean clothes or pajamas. If it is the night before your surgery, sleep in clean sheets.  For general bathing Follow these steps when using CHG cloths for general bathing (unless your health care provider gives you different instructions). Use a separate CHG cloth for each area of your body. Make sure you wash between any folds of skin and between your fingers and toes. Wash your body in the following order, switching to a new cloth after each step: The front of your neck, shoulders, and chest. Both of your arms, under your arms, and your hands. Your stomach and groin area, avoiding the genitals. Your right leg and foot. Your left leg and foot. The back of your neck, your back, and your buttocks. Do not rinse. Discard the cloth and let the area air-dry. Do not put any substances on your body afterward--such as powder, lotion, or perfume--unless you are told to do so by your health care provider. Only use lotions that are recommended by the manufacturer. Put on clean clothes or pajamas. Contact a health care provider if: Your skin gets irritated after scrubbing. You have questions about using your solution or cloth. You swallow any chlorhexidine. Call your local poison control center ([phone] in the [place] Get help right away if: Your eyes itch badly, or they become very red or swollen. Your skin itches badly and is red or swollen. Your  hearing changes. You have trouble seeing. You have swelling or tingling in your mouth or throat. You have trouble breathing. These symptoms may represent a serious problem that is an emergency. Do not wait to see if the symptoms will go away. Get medical help right away. Call your local emergency services (911 in the [place] Do not drive yourself to the hospital. Summary Chlorhexidine gluconate (CHG) is a germ-killing (antiseptic) solution that is used to clean the skin. Cleaning your skin with CHG may help to lower your risk for infection. You may be given CHG to use for bathing. It may be in a bottle or in a prepackaged cloth to use on your skin. Carefully follow your health care provider's instructions and the instructions on the product label. Do not use CHG if you have a chlorhexidine allergy. Contact your health care provider if your skin gets irritated after scrubbing. This information is not intended to replace advice given to you by your health care provider. Make sure you discuss any questions you have with your health care provider. Document Revised: 06/06/2020 Document Reviewed: 06/06/2020 Elsevier Patient Education  2022 [employer].

## 2021-02-21 ENCOUNTER — Encounter (HOSPITAL_COMMUNITY): Payer: Self-pay

## 2021-02-21 ENCOUNTER — Encounter (HOSPITAL_COMMUNITY)
Admission: RE | Admit: 2021-02-21 | Discharge: 2021-02-21 | Disposition: A | Discharge status: Home / Self Care | Payer: Medicare Other | Source: Ambulatory Visit | Attending: Urology | Admitting: Urology

## 2021-02-21 DIAGNOSIS — R7303 Prediabetes: Secondary | ICD-10-CM | POA: Insufficient documentation

## 2021-02-21 DIAGNOSIS — Z01818 Encounter for other preprocedural examination: Secondary | ICD-10-CM | POA: Insufficient documentation

## 2021-02-22 ENCOUNTER — Ambulatory Visit: Payer: Medicare Other | Admitting: Allergy & Immunology

## 2021-02-22 ENCOUNTER — Telehealth: Payer: Self-pay

## 2021-02-22 ENCOUNTER — Encounter: Payer: Self-pay | Admitting: Allergy & Immunology

## 2021-02-22 ENCOUNTER — Ambulatory Visit: Payer: Medicare Other

## 2021-02-22 ENCOUNTER — Other Ambulatory Visit: Payer: Self-pay

## 2021-02-22 VITALS — BP 110/64 | HR 98 | Temp 98.7°F | Resp 22 | Ht 64.0 in | Wt 151.0 lb

## 2021-02-22 DIAGNOSIS — J4550 Severe persistent asthma, uncomplicated: Secondary | ICD-10-CM

## 2021-02-22 DIAGNOSIS — J3089 Other allergic rhinitis: Secondary | ICD-10-CM | POA: Diagnosis not present

## 2021-02-22 DIAGNOSIS — J302 Other seasonal allergic rhinitis: Secondary | ICD-10-CM

## 2021-02-22 DIAGNOSIS — J449 Chronic obstructive pulmonary disease, unspecified: Secondary | ICD-10-CM

## 2021-02-22 NOTE — Patient Instructions (Addendum)
1. Asthma-COPD overlap syndrome - Lung testing looks bad, but this is likely because of your back pain. - We will not make any changes aside from stopping the Flovent due to the way it made your feel. - Daily controller medication(s): Stiolto two puffs once daily + prednisone 10mg  daily + Singulair 10mg  daily + Fasenra every 4 weeks (changing to every 8 weeks thereafter) - Rescue medications: albuterol nebulizer one vial every 4-6 hours as needed - Asthma control goals:  * Full participation in all desired activities (may need albuterol before activity) * Albuterol use two time or less a week on average (not counting use with activity) * Cough interfering with sleep two time or less a month * Oral steroids no more than once a year * No hospitalizations   2. Chronic rhinitis (horse, tobacco leaf, grasses, ragweed, trees, indoor molds, outdoor molds, dust mites, cat and dog) - Continue with: Zyrtec (cetirizine) 10mg  tablet once daily and Singulair (montelukast) 10mg  daily and Nasacort (triamcinolone) two sprays per nostril daily AS NEEDED - Continue with nasal saline rinses.  3. Return in about 3 months (around 05/25/2021). We can talk about starting your prednisone wean at the next visit.    Please inform of any Emergency Department visits, hospitalizations, or changes in symptoms. Call before going to the ED for breathing or allergy symptoms since we might be able to fit you in for a sick visit. Feel free to contact anytime with any questions, problems, or concerns.  It was a pleasure to see you again today!  Websites that have reliable patient information: 1. American Academy of Asthma, Allergy, and Immunology: www.aaaai.org 2. Food Allergy Research and Education (FARE): foodallergy.org 3. Mothers of Asthmatics: http://www.asthmacommunitynetwork.org 4. American College of Allergy, Asthma, and Immunology: www.acaai.org   COVID-19 Vaccine Information can be found at:  05/27/2021 For questions related to vaccine distribution or appointments, please email [email or web address] or call [phone].   We realize that you might be concerned about having an allergic reaction to the COVID19 vaccines. To help with that concern, WE ARE OFFERING THE COVID19 VACCINES IN OUR OFFICE! Ask the front desk for dates!     "Like" [place] on Facebook and Instagram for our latest updates!      A healthy democracy works best when [place] participate! Make sure you are registered to vote! If you have moved or changed any of your contact information, you will need to get this updated before voting!  In some cases, you MAY be able to register to vote online: [email or web address]

## 2021-02-22 NOTE — Progress Notes (Signed)
FOLLOW UP  Date of Service/Encounter:  02/22/21   Assessment:   Steroid and oxygen dependent asthma-COPD overlap syndrome - on Fasenra  Planning to wean steroids starting at the next visit (likely over 12-18 months)   Seasonal and perennial allergic rhinitis (horse, tobacco leaf, grasses, ragweed, trees, indoor molds, outdoor molds, dust mites, cat and dog)   On disability  Plan/Recommendations:   1. Asthma-COPD overlap syndrome - Lung testing looks bad, but this is likely because of your back pain. - We will not make any changes aside from stopping the Flovent due to the way it made your feel. - Daily controller medication(s): Stiolto two puffs once daily + prednisone 10mg  daily + Singulair 10mg  daily + Fasenra every 4 weeks (changing to every 8 weeks thereafter) - Rescue medications: albuterol nebulizer one vial every 4-6 hours as needed - Asthma control goals:  * Full participation in all desired activities (may need albuterol before activity) * Albuterol use two time or less a week on average (not counting use with activity) * Cough interfering with sleep two time or less a month * Oral steroids no more than once a year * No hospitalizations   2. Chronic rhinitis (horse, tobacco leaf, grasses, ragweed, trees, indoor molds, outdoor molds, dust mites, cat and dog) - Continue with: Zyrtec (cetirizine) 10mg  tablet once daily and Singulair (montelukast) 10mg  daily and Nasacort (triamcinolone) two sprays per nostril daily AS NEEDED - Continue with nasal saline rinses.  3. Return in about 3 months (around 05/25/2021). We can talk about starting your prednisone wean at the next visit.    Subjective:   [name] is a 55 y.o. female presenting today for follow up of  Chief Complaint  Patient presents with   Asthma    Patient states she doesn't know if change of weather has caused an asthma flare up    [place] has a history of the following: Patient  Active Problem List   Diagnosis Date Noted   Radiculopathy, lumbar region 12/08/2020   Status post left hip replacement 07/22/2020   Status post right hip replacement 05/02/2020   Status post total replacement of right hip 04/15/2020   Avascular necrosis of bone of left hip (HCC) 02/10/2020   Avascular necrosis of bone of right hip (HCC) 02/10/2020   Allergic rhinitis 05/21/2019   Chronic asthmatic bronchitis (HCC) 04/30/2019   Chronic respiratory failure with hypoxia (HCC) 04/30/2019   Spinal stenosis of cervical region 04/05/2017   Partial tear of right subscapularis tendon    Incisional hernia, without obstruction or gangrene    Urinary frequency 03/21/2012   Migraine equivalent syndrome 03/21/2012   Insomnia 03/21/2012    History obtained from: chart review and patient.  [name] is a 55 y.o. female presenting for a follow up visit.  She was last seen in October 2022.  At that time, she was continued on Stiolto 2 puffs once daily as well as prednisone 10 mg daily and Singulair 10 mg daily.  We also continue with Fasenra.  For her rhinitis, would continue with Zyrtec as well as montelukast and Nasacort.  Since the last visit, she has done well.   Asthma/Respiratory Symptom History: She remains on Fasenra.  She has not noticed that it has helped much. Sometimes she feels like it has helped and sometimes it feels difficult to tell. It is hard to get a good idea of how it is working. She remains on the Stiolto two puffs once daily and  montelukast. She has not been able to tolerate the Flovent due to tachycardia. She has tried a number of different controller inhalers that I name out together.   She does have a bone scan scheduled for December 13th. This is managed by her PCP. She recently had some back surgery done and tomorrow she has a second round done. She had both of her cataract surgeries done in 2017. She is pre diabetic. She realizes that she is at risk of multiple issues. She  remains motivated to wean off of her prednisone.   Allergic Rhinitis Symptom History: She remains on the cetirizine once daily as well as montelukast and Nasacort. She has not needed antibiotics at all.   Otherwise, there have been no changes to her past medical history, surgical history, family history, or social history.    Review of Systems  Constitutional: Negative.  Negative for chills, fever, malaise/fatigue and weight loss.  HENT:  Negative for congestion, ear discharge and ear pain.   Eyes:  Negative for pain, discharge and redness.  Respiratory:  Positive for cough and wheezing. Negative for sputum production and shortness of breath.   Cardiovascular: Negative.  Negative for chest pain and palpitations.  Gastrointestinal:  Negative for abdominal pain, constipation, diarrhea, heartburn, nausea and vomiting.  Skin: Negative.  Negative for itching and rash.  Neurological:  Negative for dizziness and headaches.  Endo/Heme/Allergies:  Negative for environmental allergies. Does not bruise/bleed easily.      Objective:   Blood pressure 110/64, pulse 98, temperature 98.7 F (37.1 C), resp. rate (!) 22, height 5\' 4"  (1.626 m), weight 151 lb (68.5 kg), SpO2 100 %. Body mass index is 25.92 kg/m.   Physical Exam:  Physical Exam Vitals reviewed.  Constitutional:      Appearance: She is well-developed.     Comments: Wearing oxygen.   HENT:     Head: Normocephalic and atraumatic.     Right Ear: Tympanic membrane, ear canal and external ear normal.     Left Ear: Tympanic membrane, ear canal and external ear normal.     Nose: No nasal deformity, septal deviation, mucosal edema or rhinorrhea.     Right Turbinates: Enlarged and swollen.     Left Turbinates: Enlarged and swollen.     Right Sinus: No maxillary sinus tenderness or frontal sinus tenderness.     Left Sinus: No maxillary sinus tenderness or frontal sinus tenderness.     Comments: Dried rhinorrhea.     Mouth/Throat:      Mouth: Mucous membranes are not pale and not dry.     Pharynx: Uvula midline.  Eyes:     General: Lids are normal. No allergic shiner.       Right eye: No discharge.        Left eye: No discharge.     Conjunctiva/sclera: Conjunctivae normal.     Right eye: Right conjunctiva is not injected. No chemosis.    Left eye: Left conjunctiva is not injected. No chemosis.    Pupils: Pupils are equal, round, and reactive to light.  Cardiovascular:     Rate and Rhythm: Normal rate and regular rhythm.     Heart sounds: Normal heart sounds.  Pulmonary:     Effort: Pulmonary effort is normal. No tachypnea, accessory muscle usage or respiratory distress.     Breath sounds: Normal breath sounds. No wheezing, rhonchi or rales.     Comments: Decreased air movement at the bases. Tachypneic.  Chest:     Chest  wall: No tenderness.  Lymphadenopathy:     Cervical: No cervical adenopathy.  Skin:    General: Skin is warm.     Capillary Refill: Capillary refill takes less than 2 seconds.     Coloration: Skin is not pale.     Findings: No abrasion, erythema, petechiae or rash. Rash is not papular, urticarial or vesicular.     Comments: No eczematous lesions noted.   Neurological:     Mental Status: She is alert.  Psychiatric:        Behavior: Behavior is cooperative.     Diagnostic studies:   Spirometry: results abnormal (FEV1: 0.93/36%, FVC: 1.63/50%, FEV1/FVC: 57%).    Spirometry consistent with mixed obstructive and restrictive disease.   Allergy Studies: none        Malachi Bonds, MD  [facility]

## 2021-02-22 NOTE — Telephone Encounter (Signed)
Patient called to let Dr. Ronne Binning know her Stage 1 interstim battery had died.   Patient reports she had increased her setting to 2.5.  Patient also states she can not tolerate hydromorphone and requested oxycodone instead.   Message sent to Dr. Ronne Binning.

## 2021-02-22 NOTE — Telephone Encounter (Signed)
I called [employer] for insurance still pending for CPT [phone]  [employer] will expedite the claim.   Notified patient of pending review and may need to postpone surgery for tomorrow. Patient request I wait until 10am to check with insurance if no approval will post pone surgery to 03/09/2021

## 2021-02-23 ENCOUNTER — Encounter (HOSPITAL_COMMUNITY): Payer: Self-pay | Admitting: Urology

## 2021-02-23 ENCOUNTER — Encounter (HOSPITAL_COMMUNITY)
Admission: RE | Disposition: A | Discharge status: Home / Self Care | Payer: Self-pay | Source: Home / Self Care | Attending: Urology

## 2021-02-23 ENCOUNTER — Ambulatory Visit (HOSPITAL_COMMUNITY)
Admission: RE | Admit: 2021-02-23 | Discharge: 2021-02-23 | Disposition: A | Discharge status: Home / Self Care | Payer: Medicare Other | Attending: Urology | Admitting: Urology

## 2021-02-23 ENCOUNTER — Ambulatory Visit (HOSPITAL_COMMUNITY): Payer: Medicare Other | Admitting: Anesthesiology

## 2021-02-23 DIAGNOSIS — J449 Chronic obstructive pulmonary disease, unspecified: Secondary | ICD-10-CM | POA: Diagnosis not present

## 2021-02-23 DIAGNOSIS — Z87891 Personal history of nicotine dependence: Secondary | ICD-10-CM | POA: Insufficient documentation

## 2021-02-23 DIAGNOSIS — R519 Headache, unspecified: Secondary | ICD-10-CM | POA: Insufficient documentation

## 2021-02-23 DIAGNOSIS — N3281 Overactive bladder: Secondary | ICD-10-CM | POA: Insufficient documentation

## 2021-02-23 DIAGNOSIS — E039 Hypothyroidism, unspecified: Secondary | ICD-10-CM | POA: Insufficient documentation

## 2021-02-23 DIAGNOSIS — G4730 Sleep apnea, unspecified: Secondary | ICD-10-CM | POA: Diagnosis not present

## 2021-02-23 HISTORY — PX: INTERSTIM IMPLANT PLACEMENT: SHX5130

## 2021-02-23 SURGERY — INSERTION, SACRAL NERVE STIMULATOR, INTERSTIM, STAGE 2
Anesthesia: General

## 2021-02-23 MED ORDER — ROCURONIUM BROMIDE 10 MG/ML (PF) SYRINGE
PREFILLED_SYRINGE | INTRAVENOUS | Status: AC
  Filled 2021-02-23: qty 10

## 2021-02-23 MED ORDER — SULFAMETHOXAZOLE-TRIMETHOPRIM 800-160 MG PO TABS: 1.0000 | ORAL_TABLET | Freq: Two times a day (BID) | ORAL | 0 refills | Status: DC

## 2021-02-23 MED ORDER — BUPIVACAINE-EPINEPHRINE (PF) 0.25% -1:200000 IJ SOLN
INTRAMUSCULAR | Status: AC
  Filled 2021-02-23: qty 30

## 2021-02-23 MED ORDER — OXYCODONE-ACETAMINOPHEN 5-325 MG PO TABS
1.0000 | ORAL_TABLET | Freq: Once | ORAL | Status: AC
  Administered 2021-02-23: 15:00:00 1 via ORAL

## 2021-02-23 MED ORDER — FENTANYL CITRATE (PF) 100 MCG/2ML IJ SOLN
INTRAMUSCULAR | Status: AC
  Filled 2021-02-23: qty 2

## 2021-02-23 MED ORDER — LIDOCAINE HCL (PF) 2 % IJ SOLN
INTRAMUSCULAR | Status: AC
  Filled 2021-02-23: qty 5

## 2021-02-23 MED ORDER — MIDAZOLAM HCL 2 MG/2ML IJ SOLN
INTRAMUSCULAR | Status: AC
  Filled 2021-02-23: qty 2

## 2021-02-23 MED ORDER — CEFAZOLIN SODIUM-DEXTROSE 2-4 GM/100ML-% IV SOLN
2.0000 g | INTRAVENOUS | Status: AC
  Administered 2021-02-23: 14:00:00 2 g via INTRAVENOUS

## 2021-02-23 MED ORDER — LIDOCAINE HCL (PF) 1 % IJ SOLN
INTRAMUSCULAR | Status: DC | PRN
  Administered 2021-02-23: 10 mL

## 2021-02-23 MED ORDER — PROPOFOL 10 MG/ML IV BOLUS
INTRAVENOUS | Status: DC | PRN
  Administered 2021-02-23 (×2): 20 mg via INTRAVENOUS
  Administered 2021-02-23: 40 mg via INTRAVENOUS
  Administered 2021-02-23: 10 mg via INTRAVENOUS
  Administered 2021-02-23: 20 mg via INTRAVENOUS
  Administered 2021-02-23: 10 mg via INTRAVENOUS
  Administered 2021-02-23: 20 mg via INTRAVENOUS

## 2021-02-23 MED ORDER — LIDOCAINE HCL (PF) 1 % IJ SOLN
INTRAMUSCULAR | Status: AC
  Filled 2021-02-23: qty 30

## 2021-02-23 MED ORDER — CEFAZOLIN SODIUM-DEXTROSE 2-4 GM/100ML-% IV SOLN
INTRAVENOUS | Status: AC
  Filled 2021-02-23: qty 100

## 2021-02-23 MED ORDER — OXYCODONE-ACETAMINOPHEN 5-325 MG PO TABS
ORAL_TABLET | ORAL | Status: AC
  Filled 2021-02-23: qty 1

## 2021-02-23 MED ORDER — FENTANYL CITRATE (PF) 100 MCG/2ML IJ SOLN
INTRAMUSCULAR | Status: DC | PRN
  Administered 2021-02-23 (×2): 50 ug via INTRAVENOUS

## 2021-02-23 MED ORDER — STERILE WATER FOR IRRIGATION IR SOLN
Status: DC | PRN
  Administered 2021-02-23: 1000 mL

## 2021-02-23 MED ORDER — MIDAZOLAM HCL 5 MG/5ML IJ SOLN
INTRAMUSCULAR | Status: DC | PRN
  Administered 2021-02-23 (×2): 1 mg via INTRAVENOUS

## 2021-02-23 MED ORDER — LACTATED RINGERS IV SOLN: INTRAVENOUS | Status: DC | PRN

## 2021-02-23 MED ORDER — BUPIVACAINE HCL (PF) 0.25 % IJ SOLN
INTRAMUSCULAR | Status: DC | PRN
  Administered 2021-02-23: 10 mL

## 2021-02-23 MED ORDER — OXYCODONE-ACETAMINOPHEN 5-325 MG PO TABS: 1.0000 | ORAL_TABLET | ORAL | 0 refills | Status: DC | PRN

## 2021-02-23 MED ORDER — ONDANSETRON HCL 4 MG/2ML IJ SOLN
INTRAMUSCULAR | Status: DC | PRN
  Administered 2021-02-23: 4 mg via INTRAVENOUS

## 2021-02-23 SURGICAL SUPPLY — 26 items
ADH SKN CLS APL DERMABOND .7 (GAUZE/BANDAGES/DRESSINGS) ×1
COVER PROBE W GEL 5X96 (DRAPES) ×2 IMPLANT
COVER SURGICAL LIGHT HANDLE (MISCELLANEOUS) ×4 IMPLANT
DECANTER SPIKE VIAL GLASS SM (MISCELLANEOUS) ×2 IMPLANT
DERMABOND ADVANCED (GAUZE/BANDAGES/DRESSINGS) ×1
DERMABOND ADVANCED .7 DNX12 (GAUZE/BANDAGES/DRESSINGS) ×1 IMPLANT
ELECT REM PT RETURN 9FT ADLT (ELECTROSURGICAL)
ELECTRODE REM PT RTRN 9FT ADLT (ELECTROSURGICAL) IMPLANT
GAUZE SPONGE 4X4 12PLY STRL (GAUZE/BANDAGES/DRESSINGS) ×2 IMPLANT
GLOVE INDICATOR 7.0 STRL GRN (GLOVE) ×4 IMPLANT
GLOVE SURG POLYISO LF SZ8 (GLOVE) ×2 IMPLANT
GOWN STRL REUS W/TWL LRG LVL3 (GOWN DISPOSABLE) ×2 IMPLANT
GOWN STRL REUS W/TWL XL LVL3 (GOWN DISPOSABLE) ×2 IMPLANT
KIT HANDSET INTERSTIM COMM (NEUROSURGERY SUPPLIES) ×1 IMPLANT
KIT TURNOVER CYSTO (KITS) ×2 IMPLANT
NDL HYPO 21X1.5 SAFETY (NEEDLE) IMPLANT
NEEDLE HYPO 21X1.5 SAFETY (NEEDLE) IMPLANT
PACK MINOR (CUSTOM PROCEDURE TRAY) ×1 IMPLANT
PAD ARMBOARD 7.5X6 YLW CONV (MISCELLANEOUS) ×1 IMPLANT
SET BASIN LINEN APH (SET/KITS/TRAYS/PACK) ×2 IMPLANT
STIMULATOR INTERSTIM 2X1.7X.3 (Miscellaneous) ×1 IMPLANT
SUT MNCRL AB 4-0 PS2 18 (SUTURE) ×2 IMPLANT
SUT VIC AB 3-0 SH 27 (SUTURE) ×2
SUT VIC AB 3-0 SH 27X BRD (SUTURE) ×1 IMPLANT
SYR CONTROL 10ML LL (SYRINGE) IMPLANT
WATER STERILE IRR 1000ML POUR (IV SOLUTION) ×2 IMPLANT

## 2021-02-23 NOTE — Op Note (Signed)
Preoperative diagnosis: overactive bladder  Postoperative diagnosis: Same  Procedure: Placement of InterStim stage 2 and impedance check  Surgeon: Dr. Wilkie Aye  Assistant: None  Antibiotics: Ancef  Drains: None  Indications: The patient is a 55yo with a hx of overactive bladder that has been refractory to medical therapy. She has Stage 1 interstim  2 weeks ago and has been able to void normally since placement with minimal PVR. After discussing treatment options she has elected to proceed with Interstim Stage 2 implantation  Procedure in detail: Prior to the procedure consent was obtained. The patient was brought to the operating room and a brief time out was completed to ensure correct patient, correct procedure and correct site. Preoperative antibiotics were given. Extra care was taken positioning the patient in a prone position. Usual skin preparation was utilized. The previous incision in the left upper buttock was opend and previous vicryl suture removed. We then brought the lead connector into the operative field. We then removed the silk suture exposing the screws holding the temporary lead. We then unscrewed the lead and removed it. We then irrigated the pocket with GU irrigation followed by sterile water. We then attached the generator and battery to the lead and tightened the screws. We then placed the generator in the previous pocket int he left upper buttock. We closed the deep subcutaneous tissue with 2-0 vicryl in a running fashion. The incisions were then closed with running 3-0 vicryl.  Impedance check was done utilizing sterile technique and was normal in all 4 positions We then placed dermabond on the incisions and this concluded the procedure which was well tolerated by the patient.  Complications: none  Condition: stable, transferred to PACU  Plan: The patient is to be discharged home after she voids in the PACU. She is to followup in 4 weeks for wound check

## 2021-02-23 NOTE — Interval H&P Note (Signed)
History and Physical Interval Note:  02/23/2021 12:41 PM  [name]  has presented today for surgery, with the diagnosis of OAB.  The various methods of treatment have been discussed with the patient and family. After consideration of risks, benefits and other options for treatment, the patient has consented to  Procedure(s) with comments: INTERSTIM IMPLANT SECOND STAGE (N/A) - Rep coming, time needs to stay at 2:00 as a surgical intervention.  The patient's history has been reviewed, patient examined, no change in status, stable for surgery.  I have reviewed the patient's chart and labs.  Questions were answered to the patient's satisfaction.     Wilkie Aye

## 2021-02-23 NOTE — Anesthesia Preprocedure Evaluation (Signed)
Anesthesia Evaluation  Patient identified by MRN, date of birth, ID band Patient awake    Reviewed: Allergy & Precautions, H&P , NPO status , Patient's Chart, lab work & pertinent test results, reviewed documented beta blocker date and time   History of Anesthesia Complications (+) PONV and history of anesthetic complications  Airway Mallampati: II  TM Distance: >3 FB Neck ROM: full    Dental no notable dental hx.    Pulmonary neg pulmonary ROS, shortness of breath, asthma , sleep apnea , COPD, former smoker,    Pulmonary exam normal breath sounds clear to auscultation       Cardiovascular Exercise Tolerance: Good negative cardio ROS   Rhythm:regular Rate:Normal     Neuro/Psych  Headaches, PSYCHIATRIC DISORDERS Anxiety  Neuromuscular disease negative neurological ROS  negative psych ROS   GI/Hepatic negative GI ROS, Neg liver ROS,   Endo/Other  negative endocrine ROSHypothyroidism   Renal/GU negative Renal ROS  negative genitourinary   Musculoskeletal   Abdominal   Peds  Hematology negative hematology ROS (+)   Anesthesia Other Findings   Reproductive/Obstetrics negative OB ROS                             Anesthesia Physical Anesthesia Plan  ASA: 3  Anesthesia Plan: General   Post-op Pain Management:    Induction:   PONV Risk Score and Plan: Ondansetron  Airway Management Planned:   Additional Equipment:   Intra-op Plan:   Post-operative Plan:   Informed Consent: I have reviewed the patients History and Physical, chart, labs and discussed the procedure including the risks, benefits and alternatives for the proposed anesthesia with the patient or authorized representative who has indicated his/her understanding and acceptance.     Dental Advisory Given  Plan Discussed with: CRNA  Anesthesia Plan Comments:         Anesthesia Quick Evaluation

## 2021-02-23 NOTE — Transfer of Care (Signed)
Immediate Anesthesia Transfer of Care Note  Patient: [name]  Procedure(s) Performed: Leane Platt IMPLANT SECOND STAGE  Patient Location: PACU  Anesthesia Type:General  Level of Consciousness: awake  Airway & Oxygen Therapy: Patient Spontanous Breathing  Post-op Assessment: Report given to RN  Post vital signs: Reviewed and stable  Last Vitals:  Vitals Value Taken Time  BP    Temp    Pulse 86 02/23/21 1447  Resp 12 02/23/21 1447  SpO2 99 % 02/23/21 1447  Vitals shown include unvalidated device data.  Last Pain:  Vitals:   02/23/21 1315  TempSrc: Oral  PainSc: 4          Complications: No notable events documented.

## 2021-02-23 NOTE — Anesthesia Postprocedure Evaluation (Signed)
Anesthesia Post Note  Patient: [name]  Procedure(s) Performed: Leane Platt IMPLANT SECOND STAGE  Patient location during evaluation: Phase II Anesthesia Type: General Level of consciousness: awake Pain management: pain level controlled Vital Signs Assessment: post-procedure vital signs reviewed and stable Respiratory status: spontaneous breathing and respiratory function stable Cardiovascular status: blood pressure returned to baseline and stable Postop Assessment: no headache and no apparent nausea or vomiting Anesthetic complications: no Comments: Late entry   No notable events documented.   Last Vitals:  Vitals:   02/23/21 1315 02/23/21 1447  BP: 101/63 107/76  Pulse: 87   Resp: 18   Temp: 36.9 C 36.5 C  SpO2: 100%     Last Pain:  Vitals:   02/23/21 1447  TempSrc:   PainSc: 0-No pain                 Windell [place]

## 2021-02-24 ENCOUNTER — Encounter (HOSPITAL_COMMUNITY): Payer: Self-pay | Admitting: Urology

## 2021-02-27 ENCOUNTER — Encounter: Payer: Self-pay | Admitting: Urology

## 2021-03-15 ENCOUNTER — Other Ambulatory Visit: Payer: Self-pay

## 2021-03-15 ENCOUNTER — Encounter: Payer: Self-pay | Admitting: Urology

## 2021-03-15 ENCOUNTER — Ambulatory Visit (INDEPENDENT_AMBULATORY_CARE_PROVIDER_SITE_OTHER): Payer: Medicare Other | Admitting: Urology

## 2021-03-15 VITALS — BP 110/73 | HR 93

## 2021-03-15 DIAGNOSIS — N3000 Acute cystitis without hematuria: Secondary | ICD-10-CM

## 2021-03-15 DIAGNOSIS — R32 Unspecified urinary incontinence: Secondary | ICD-10-CM

## 2021-03-15 LAB — MICROSCOPIC EXAMINATION: Renal Epithel, UA: NONE SEEN /hpf

## 2021-03-15 LAB — URINALYSIS, ROUTINE W REFLEX MICROSCOPIC
Bilirubin, UA: NEGATIVE
Glucose, UA: NEGATIVE
Ketones, UA: NEGATIVE
Leukocytes,UA: NEGATIVE
Nitrite, UA: NEGATIVE
Protein,UA: NEGATIVE
Specific Gravity, UA: 1.015 (ref 1.005–1.030)
Urobilinogen, Ur: 0.2 mg/dL (ref 0.2–1.0)
pH, UA: 7 (ref 5.0–7.5)

## 2021-03-15 NOTE — Progress Notes (Signed)
03/15/2021 3:20 PM   [name] 1965-05-01 [id number]  Referring provider: Celene Squibb, MD [name],  [place] [address]  Followup OAB  HPI: [name] is a 55yo here for followup for OAB. She underwent Stage 2 interstim 11/17. Her device works well but she occasionally has urgency and urge incontinence. Her device is currently are on 2.0 and she did better at 2.5.    PMH: Past Medical History:  Diagnosis Date   Anemia    Anxiety    Arthritis    Asthma    Cervical radiculopathy    Cluster headaches    COPD (chronic obstructive pulmonary disease) (HCC)    COPD (chronic obstructive pulmonary disease) with chronic bronchitis (HCC)    GERD (gastroesophageal reflux disease)    Hyperlipemia    Hypoglycemia    Hypothyroidism    Migraine    Pneumothorax    PONV (postoperative nausea and vomiting)    Pre-diabetes    Rectal discharge 07/28/2010   Shortness of breath    Sleep apnea    cannot tolerate-not using CPAP   Sluggishness 07/28/2010    Surgical History: Past Surgical History:  Procedure Laterality Date   ABDOMINAL HYSTERECTOMY     with bladder suspension   CERVICAL FUSION     CHOLECYSTECTOMY     COLONOSCOPY N/A 07/30/2013   Procedure: COLONOSCOPY;  Surgeon: Rogene Houston, MD;  [facility];  Service: Endoscopy;  Laterality: N/A;  [place] OF SHOULDER Right 10/11/2016   Procedure: EXAM UNDER ANESTHESIA WITH MANIPULATION OF SHOULDER;  Surgeon: Carole Civil, MD;  Location: [facility];  Service: Orthopedics;  Laterality: Right;   HERNIA REPAIR     INCISIONAL HERNIA REPAIR N/A 07/30/2016   Procedure: HERNIA REPAIR INCISIONAL WITH MESH;  Surgeon: Aviva Signs, MD;  Location: [facility];  Service: General;  Laterality: N/A;   INTERSTIM IMPLANT PLACEMENT  02/02/2021   Procedure: Barrie [place]yme IMPLANT FIRST STAGE LEFT SACRUM ;  Surgeon: Cleon Gustin, MD;  Location: [facility];  Service: Urology;;    Barrie Lyme IMPLANT PLACEMENT N/A 02/23/2021   Procedure: Barrie Lyme IMPLANT SECOND STAGE;  Surgeon: Cleon Gustin, MD;  Location: [facility];  Service: Urology;  Laterality: N/A;  Rep coming, time needs to stay at 2:00   [place]     right lung    POSTERIOR CERVICAL LAMINECTOMY Right 04/05/2017   Procedure: Right C6-7 Posterior cervical laminectomy;  Surgeon: Kary Kos, MD;  Location: [facility];  Service: Neurosurgery;  Laterality: Right;  Right C6-7 Posterior cervical laminectomy   SHOULDER ARTHROSCOPY WITH ROTATOR CUFF REPAIR Right 10/11/2016   Procedure: SHOULDER ARTHROSCOPY;  Surgeon: Carole Civil, MD;  Location: [facility];  Service: Orthopedics;  Laterality: Right;   TOTAL HIP ARTHROPLASTY Right 04/15/2020   Procedure: RIGHT TOTAL HIP ARTHROPLASTY ANTERIOR APPROACH;  Surgeon: Mcarthur Rossetti, MD;  Location: [facility];  Service: Orthopedics;  Laterality: Right;   TOTAL HIP ARTHROPLASTY Left 07/22/2020   Procedure: LEFT  HIP ARTHROPLASTY ANTERIOR APPROACH;  Surgeon: Mcarthur Rossetti, MD;  Location: [facility];  Service: Orthopedics;  Laterality: Left;  RNFA   TUBAL LIGATION      Home Medications:  Allergies as of 03/15/2021       Reactions   Aspirin Shortness Of Breath   Causes asthma flares    Penicillins Other (See Comments)   UNSPECIFIED REACTION OF CHILDHOOD Has patient had a  PCN reaction causing immediate rash, facial/tongue/throat swelling, SOB or lightheadedness with hypotension: NO Has patient had a PCN reaction causing severe rash involving mucus membranes or skin necrosis: NO Has patient had a PCN reaction that required hospitalization: no Has patient had a PCN reaction occurring within the last 10 years: NO If all of the above answers are "NO", then may proceed with Cephalosporin use.   Percocet [oxycodone-acetaminophen]    migraines   Tramadol    insomnia   Vicodin [hydrocodone-acetaminophen] Other (See Comments)   Headaches          Medication List        Accurate as of March 15, 2021  3:20 PM. If you have any questions, ask your nurse or doctor.          acetaminophen 500 MG tablet Commonly known as: TYLENOL Take 1,000 mg by mouth every 6 (six) hours as needed for moderate pain.   AeroChamber Plus inhaler 1 each by Other route as needed for other (Use with inhalers). Use as instructed   albuterol 108 (90 Base) MCG/ACT inhaler Commonly known as: VENTOLIN HFA Inhale 2 puffs into the lungs every 4 (four) hours as needed for shortness of breath.   aspirin-acetaminophen-caffeine 250-250-65 MG tablet Commonly known as: EXCEDRIN MIGRAINE Take 2 tablets by mouth every 8 (eight) hours as needed for migraine or headache.   calcium carbonate 750 MG chewable tablet Commonly known as: TUMS EX Chew 2 tablets by mouth daily as needed for heartburn.   cetirizine 10 MG tablet Commonly known as: ZYRTEC Take 10 mg by mouth daily.   docusate sodium 100 MG capsule Commonly known as: COLACE Take 100-200 mg by mouth daily as needed for mild constipation.   famotidine 20 MG tablet Commonly known as: PEPCID Take 20 mg by mouth daily as needed for heartburn or indigestion.   Fasenra 30 MG/ML [place] Generic drug: Benralizumab Inject 30 mg into the skin every 8 (eight) weeks.   fluticasone 220 MCG/ACT inhaler Commonly known as: Flovent HFA Inhale 2 puffs into the lungs 2 (two) times daily.   gabapentin 100 MG capsule Commonly known as: NEURONTIN Take 2 capsules (200 mg total) by mouth 3 (three) times daily. What changed:  when to take this reasons to take this   levothyroxine 75 MCG tablet Commonly known as: SYNTHROID Take 0.5 tablets (37.5 mcg total) by mouth daily before breakfast.   LORazepam 1 MG tablet Commonly known as: ATIVAN Take 1.5 mg by mouth 2 (two) times daily.   montelukast 10 MG tablet Commonly known as: SINGULAIR Take 1 tablet (10 mg total) by mouth at bedtime.   multivitamin  tablet Take 1 tablet by mouth every evening.   NASACORT ALLERGY 24HR NA Place 2 sprays into the nose daily as needed (allergies).   OneTouch Delica Plus 0000000 Misc Apply topically.   oxyCODONE-acetaminophen 5-325 MG tablet Commonly known as: Percocet Take 1 tablet by mouth every 4 (four) hours as needed for severe pain.   pantoprazole 40 MG tablet Commonly known as: PROTONIX Take 30- 60 min before your first and last meals of the day What changed:  how much to take how to take this when to take this   pravastatin 40 MG tablet Commonly known as: PRAVACHOL Take 40 mg by mouth at bedtime.   predniSONE 10 MG tablet Commonly known as: DELTASONE Take 1 tablet by mouth once daily   Arlyce Har[employer] Devi 1 applicator by Does not apply route as needed.   Stiolto  Respimat 2.5-2.5 MCG/ACT Aers Generic drug: Tiotropium Bromide-Olodaterol Inhale 2 puffs into the lungs daily.   sulfamethoxazole-trimethoprim 800-160 MG tablet Commonly known as: BACTRIM DS Take 1 tablet by mouth 2 (two) times daily.   SUMAtriptan 6 MG/0.5ML Soaj Inject 6 mg as directed daily as needed (migraine).   valACYclovir 500 MG tablet Commonly known as: VALTREX Take 500-2,000 mg by mouth See admin instructions. Take 2000mg s at first sign of fever blister outbreak then take 500mg s daily until gone   Vitamin D3 Super Strength 50 MCG (2000 UT) Caps Generic drug: Cholecalciferol Take 2,000 Units by mouth daily.        Allergies:  Allergies  Allergen Reactions   Aspirin Shortness Of Breath    Causes asthma flares    Penicillins Other (See Comments)    UNSPECIFIED REACTION OF CHILDHOOD  Has patient had a PCN reaction causing immediate rash, facial/tongue/throat swelling, SOB or lightheadedness with hypotension: NO Has patient had a PCN reaction causing severe rash involving mucus membranes or skin necrosis: NO Has patient had a PCN reaction that required hospitalization: no Has patient had a PCN  reaction occurring within the last 10 years: NO If all of the above answers are "NO", then may proceed with Cephalosporin use.    Percocet [Oxycodone-Acetaminophen]     migraines   Tramadol     insomnia   Vicodin [Hydrocodone-Acetaminophen] Other (See Comments)    Headaches     Family History: Family History  Problem Relation Age of Onset   COPD Mother    Diabetes Mother    Hyperlipidemia Mother    Hypertension Mother    Bipolar disorder Son    Heart failure Maternal Grandmother    Hypertension Maternal Grandmother    Thyroid disease Maternal Grandmother    Diabetes Paternal Grandmother    Alzheimer's disease Paternal Grandmother    Heart failure Paternal Grandfather    Hypertension Paternal Grandfather    Hypertension Father    Colon cancer Neg Hx     Social History:  reports that she quit smoking about 13 years ago. Her smoking use included cigarettes. She has a 30.00 pack-year smoking history. She has never used smokeless tobacco. She reports that she does not drink alcohol and does not use drugs.  ROS: All other review of systems were reviewed and are negative except what is noted above in HPI  Physical Exam: BP 110/73   Pulse 93   Constitutional:  Alert and oriented, No acute distress. HEENT: [place] AT, moist mucus membranes.  Trachea midline, no masses. Cardiovascular: No clubbing, cyanosis, or edema. Respiratory: Normal respiratory effort, no increased work of breathing. GI: Abdomen is soft, nontender, nondistended, no abdominal masses GU: No CVA tenderness.  Lymph: No cervical or inguinal lymphadenopathy. Skin: No rashes, bruises or suspicious lesions. Neurologic: Grossly intact, no focal deficits, moving all 4 extremities. Psychiatric: Normal mood and affect.  Laboratory Data: Lab Results  Component Value Date   WBC 11.2 (H) 01/31/2021   HGB 10.8 (L) 01/31/2021   HCT 34.6 (L) 01/31/2021   MCV 92.5 01/31/2021   PLT 320 01/31/2021    Lab Results   Component Value Date   CREATININE 0.93 07/23/2020    No results found for: PSA  No results found for: TESTOSTERONE  Lab Results  Component Value Date   HGBA1C 6.0 (H) 07/13/2020    Urinalysis    Component Value Date/Time   APPEARANCEUR Clear 11/09/2020 1333   GLUCOSEU Negative 11/09/2020 1333   BILIRUBINUR Negative 11/09/2020 1333  PROTEINUR Negative 11/09/2020 1333   NITRITE Positive (A) 11/09/2020 1333   LEUKOCYTESUR Negative 11/09/2020 1333    Lab Results  Component Value Date   LABMICR See below: 11/09/2020   WBCUA 6-10 (A) 11/09/2020   LABEPIT 0-10 11/09/2020   BACTERIA Many (A) 11/09/2020    Pertinent Imaging:  Results for orders placed during the hospital encounter of 06/11/19  DG Abd 1 View  Narrative CLINICAL DATA:  Partial intestinal obstruction.  EXAM: ABDOMEN - 1 VIEW  COMPARISON:  None.  FINDINGS: Bowel gas pattern is normal without evidence of ileus or obstruction on this single view. No abnormal calcifications or acute bone findings.  IMPRESSION: Unremarkable one-view abdominal radiograph.   Electronically Signed By: Nelson Chimes M.D. On: 06/11/2019 15:39  No results found for this or any previous visit.  No results found for this or any previous visit.  No results found for this or any previous visit.  No results found for this or any previous visit.  No results found for this or any previous visit.  No results found for this or any previous visit.  No results found for this or any previous visit.   Assessment & Plan:    1. Urinary incontinence, unspecified type -RTC 3 months - Urinalysis, Routine w reflex microscopic     No follow-ups on file.  Nicolette Bang, MD  [facility] [place]

## 2021-03-15 NOTE — Progress Notes (Signed)
Urological Symptom Review  Patient is experiencing the following symptoms: Hard to postpone urination Get up at night to urinate Leakage of urine   Review of Systems  Gastrointestinal (upper)  : Indigestion/heartburn  Gastrointestinal (lower) : Constipation  Constitutional : Night Sweats Fatigue  Skin: Itching  Eyes: Blurred vision  Ear/Nose/Throat : Negative for Ear/Nose/Throat symptoms  Hematologic/Lymphatic: Easy bruising  Cardiovascular : Negative for cardiovascular symptoms  Respiratory : Cough Shortness of breath  Endocrine: Excessive thirst  Musculoskeletal: Back pain  Neurological: Dizziness  Psychologic: Depression Anxiety

## 2021-03-21 ENCOUNTER — Inpatient Hospital Stay (HOSPITAL_COMMUNITY): Admission: RE | Admit: 2021-03-21 | Payer: Medicare Other | Source: Ambulatory Visit

## 2021-03-21 ENCOUNTER — Encounter: Payer: Self-pay | Admitting: Urology

## 2021-03-21 NOTE — Patient Instructions (Signed)

## 2021-03-23 ENCOUNTER — Encounter: Payer: Self-pay | Admitting: Orthopaedic Surgery

## 2021-03-23 ENCOUNTER — Other Ambulatory Visit: Payer: Self-pay | Admitting: Orthopaedic Surgery

## 2021-03-23 ENCOUNTER — Other Ambulatory Visit: Payer: Self-pay | Admitting: Physician Assistant

## 2021-03-23 MED ORDER — PREGABALIN 75 MG PO CAPS: 75.0000 mg | ORAL_CAPSULE | Freq: Two times a day (BID) | ORAL | 0 refills | Status: DC

## 2021-03-23 MED ORDER — GABAPENTIN 100 MG PO CAPS: 100.0000 mg | ORAL_CAPSULE | Freq: Two times a day (BID) | ORAL | 0 refills | Status: DC

## 2021-03-24 ENCOUNTER — Other Ambulatory Visit: Payer: Self-pay

## 2021-03-24 ENCOUNTER — Other Ambulatory Visit: Payer: Self-pay | Admitting: Orthopaedic Surgery

## 2021-03-24 ENCOUNTER — Ambulatory Visit (HOSPITAL_COMMUNITY)
Admission: RE | Admit: 2021-03-24 | Discharge: 2021-03-24 | Disposition: A | Discharge status: Home / Self Care | Payer: Medicare Other | Source: Ambulatory Visit | Attending: Family Medicine | Admitting: Family Medicine

## 2021-03-24 DIAGNOSIS — Z13820 Encounter for screening for osteoporosis: Secondary | ICD-10-CM | POA: Insufficient documentation

## 2021-03-24 DIAGNOSIS — M8588 Other specified disorders of bone density and structure, other site: Secondary | ICD-10-CM | POA: Diagnosis not present

## 2021-03-24 DIAGNOSIS — Z780 Asymptomatic menopausal state: Secondary | ICD-10-CM | POA: Diagnosis not present

## 2021-03-24 MED ORDER — GABAPENTIN 100 MG PO CAPS: 100.0000 mg | ORAL_CAPSULE | Freq: Two times a day (BID) | ORAL | 0 refills | Status: DC

## 2021-04-19 ENCOUNTER — Ambulatory Visit (INDEPENDENT_AMBULATORY_CARE_PROVIDER_SITE_OTHER): Payer: Medicare Other

## 2021-04-19 ENCOUNTER — Other Ambulatory Visit: Payer: Self-pay

## 2021-04-19 DIAGNOSIS — J4550 Severe persistent asthma, uncomplicated: Secondary | ICD-10-CM | POA: Diagnosis not present

## 2021-04-19 MED ORDER — BENRALIZUMAB 30 MG/ML ~~LOC~~ SOSY
30.0000 mg | PREFILLED_SYRINGE | SUBCUTANEOUS | Status: DC
  Administered 2021-04-19 – 2022-05-21 (×8): 30 mg via SUBCUTANEOUS

## 2021-04-23 ENCOUNTER — Other Ambulatory Visit: Payer: Self-pay | Admitting: Orthopaedic Surgery

## 2021-05-16 ENCOUNTER — Other Ambulatory Visit (HOSPITAL_COMMUNITY): Payer: Self-pay | Admitting: Family Medicine

## 2021-05-16 DIAGNOSIS — R140 Abdominal distension (gaseous): Secondary | ICD-10-CM

## 2021-05-22 ENCOUNTER — Ambulatory Visit: Payer: Medicare Other | Admitting: Allergy & Immunology

## 2021-05-22 ENCOUNTER — Telehealth: Payer: Self-pay

## 2021-05-22 ENCOUNTER — Other Ambulatory Visit: Payer: Self-pay

## 2021-05-22 ENCOUNTER — Encounter: Payer: Self-pay | Admitting: Allergy & Immunology

## 2021-05-22 VITALS — BP 118/76 | HR 98 | Temp 100.0°F | Resp 16 | Ht 64.0 in | Wt 151.0 lb

## 2021-05-22 DIAGNOSIS — J449 Chronic obstructive pulmonary disease, unspecified: Secondary | ICD-10-CM

## 2021-05-22 DIAGNOSIS — J3089 Other allergic rhinitis: Secondary | ICD-10-CM | POA: Diagnosis not present

## 2021-05-22 DIAGNOSIS — J302 Other seasonal allergic rhinitis: Secondary | ICD-10-CM

## 2021-05-22 MED ORDER — BUDESONIDE 0.25 MG/2ML IN SUSP: RESPIRATORY_TRACT | 2 refills | Status: DC

## 2021-05-22 MED ORDER — FORMOTEROL FUMARATE 20 MCG/2ML IN NEBU: INHALATION_SOLUTION | RESPIRATORY_TRACT | 2 refills | Status: DC

## 2021-05-22 MED ORDER — METHYLPREDNISOLONE ACETATE 80 MG/ML IJ SUSP
80.0000 mg | Freq: Once | INTRAMUSCULAR | Status: AC
Start: 2021-05-22 — End: 2021-05-22
  Administered 2021-05-22: 80 mg via INTRAMUSCULAR

## 2021-05-22 NOTE — Progress Notes (Signed)
FOLLOW UP  Date of Service/Encounter:  05/22/21   Assessment:   Steroid and oxygen dependent asthma-COPD overlap syndrome - on Fasenra  AAT1 - MM phenotype with level of 141    Planning to wean steroids starting at the next visit (likely over 12-18 months)   Seasonal and perennial allergic rhinitis (horse, tobacco leaf, grasses, ragweed, trees, indoor molds, outdoor molds, dust mites, cat and dog)   On disability  Plan/Recommendations:   1. Asthma-COPD overlap syndrome - Lung testing not done today. - I would COVID test again on Thursday just to be on the safe side (this is 5 days after your first symptoms).  - You were wheezing more than normal. - DepoMedrol will be covered Exam present at that evening because of that.  Mg given in clinic today. - Hold your daily prednisone and start the prednisone dose pack provided today (take your first dose of this tonight). - We are going to change your controller medications. - Stop the Stiolto since you did not feel that this was working.  - Daily controller medication(s): Pulmicort (budesonide) + Performist (formoterol) mixed together in nebulizer TWICE DAILY + prednisone 10mg  daily + Singulair 10mg  daily + Fasenra every 8 weeks - Rescue medications: albuterol nebulizer one vial every 4-6 hours as needed - Asthma control goals:  * Full participation in all desired activities (may need albuterol before activity) * Albuterol use two time or less a week on average (not counting use with activity) * Cough interfering with sleep two time or less a month * Oral steroids no more than once a year * No hospitalizations   2. Chronic rhinitis (horse, tobacco leaf, grasses, ragweed, trees, indoor molds, outdoor molds, dust mites, cat and dog) - Continue with: Zyrtec (cetirizine) 10mg  tablet once daily and Singulair (montelukast) 10mg  daily and Nasacort (triamcinolone) two sprays per nostril daily AS NEEDED - Continue with nasal saline  rinses.  3. Follow up in one month and we can still discuss weaning prednisone. We will get there! This is just a small hiccup!   Subjective:   [name] is a 56 y.o. female presenting today for follow up of  Chief Complaint  Patient presents with   Asthma    Had 6 asthma attaches since Saturday    Cough    Constant    Other    Covid tested on Sunday and it was negative     [name] has a history of the following: Patient Active Problem List   Diagnosis Date Noted   Radiculopathy, lumbar region 12/08/2020   Status post left hip replacement 07/22/2020   Status post right hip replacement 05/02/2020   Status post total replacement of right hip 04/15/2020   Avascular necrosis of bone of left hip ([place]) 02/10/2020   Avascular necrosis of bone of right hip ([place]) 02/10/2020   Allergic rhinitis 05/21/2019   Chronic asthmatic bronchitis ([place]) 04/30/2019   Chronic respiratory failure with hypoxia ([place]) 04/30/2019   Spinal stenosis of cervical region 04/05/2017   Partial tear of right subscapularis tendon    Incisional hernia, without obstruction or gangrene    Urinary frequency 03/21/2012   Migraine equivalent syndrome 03/21/2012   Insomnia 03/21/2012    History obtained from: chart review and patient.  [name] is a 57 y.o. female presenting for a follow up visit.  She was last seen in November 2022.  At that time, her husband looked better but she had recently experienced back pain.  We  did not make any changes aside from stopping the Flovent.  We continued with Stiolto 2 puffs once daily as well as prednisone 10 mg daily, Singulair 10 mg daily, and Fasenra every 8 weeks.  For her rhinitis, would continue with Zyrtec as well as Singulair and Nasacort.  She was very interested in weaning down her prednisone.  In the interim, she has been SOB for a while now. When she is washing  clothes or doing dishes, she has been SOB. Saturday morning, as soon as she bent  over to come out of the car, she started coughing and went into a "full blown asthma attack". It took six puffs of her inhaler to get her into the house to lay dow and calm down. Albuterol helps transiently, but does not stop it. She had a low grade fever. Her husband has not been sick at all. Husband has MS and he has been well otherwise. She has bene taking Delsum the last two nights and this is helping.   Asthma/Respiratory Symptom History: She has bene using her Stiolto, but she reports that her body is not responding to it any longer.  She had previously not noticed an improvement with her breathing with the [facility], but she has been more active since the last visit. She did test herself for COVID and it was negative.  She is rather sad today because we have talked about weaning her prednisone but yet she is being bursted today.  However, I told her this is just 1 little hiccup in the road to getting up with her prednisone.  We will see her again in a month and discuss the weaning process.  Allergic Rhinitis Symptom History: She remains on her Singulair as well as Zyrtec and Nasacort.  She is not worried about the Nasacort, she is better with the Zyrtec and the Singulair.  She has not needed antibiotics since the last time we saw her.  Otherwise, there have been no changes to her past medical history, surgical history, family history, or social history.    Review of Systems  Constitutional: Negative.  Negative for chills, fever, malaise/fatigue and weight loss.  HENT:  Positive for congestion and sinus pain. Negative for ear discharge and ear pain.   Eyes:  Negative for pain, discharge and redness.  Respiratory:  Negative for cough, sputum production, shortness of breath and wheezing.   Cardiovascular: Negative.  Negative for chest pain and palpitations.  Gastrointestinal:  Negative for abdominal pain, constipation, diarrhea, heartburn, nausea and vomiting.  Skin: Negative.  Negative for  itching and rash.  Neurological:  Negative for dizziness and headaches.  Endo/Heme/Allergies:  Positive for environmental allergies. Does not bruise/bleed easily.      Objective:   Blood pressure 118/76, pulse 98, temperature 100 F (37.8 C), resp. rate 16, height 5\' 4"  (1.626 m), weight 151 lb 0.2 oz (68.5 kg), SpO2 97 %. Body mass index is 25.92 kg/m.   Physical Exam Vitals reviewed.  Constitutional:      Appearance: She is well-developed. She is ill-appearing.     Comments: Wearing oxygen.  Coughing quite a bit during the visit today.  HENT:     Head: Normocephalic and atraumatic.     Right Ear: Tympanic membrane, ear canal and external ear normal.     Left Ear: Tympanic membrane, ear canal and external ear normal.     Nose: No nasal deformity, septal deviation, mucosal edema or rhinorrhea.     Right Turbinates: Enlarged, swollen and  pale.     Left Turbinates: Enlarged, swollen and pale.     Right Sinus: No maxillary sinus tenderness or frontal sinus tenderness.     Left Sinus: No maxillary sinus tenderness or frontal sinus tenderness.     Comments: Dried rhinorrhea.     Mouth/Throat:     Mouth: Mucous membranes are not pale and not dry.     Pharynx: Uvula midline.  Eyes:     General: Lids are normal. No allergic shiner.       Right eye: No discharge.        Left eye: No discharge.     Conjunctiva/sclera: Conjunctivae normal.     Right eye: Right conjunctiva is not injected. No chemosis.    Left eye: Left conjunctiva is not injected. No chemosis.    Pupils: Pupils are equal, round, and reactive to light.  Cardiovascular:     Rate and Rhythm: Normal rate and regular rhythm.     Heart sounds: Normal heart sounds.  Pulmonary:     Effort: Pulmonary effort is normal. No tachypnea, accessory muscle usage or respiratory distress.     Breath sounds: Examination of the right-upper field reveals wheezing. Examination of the left-upper field reveals wheezing. Examination of the  right-middle field reveals wheezing. Examination of the left-middle field reveals wheezing. Examination of the right-lower field reveals wheezing. Examination of the left-lower field reveals wheezing. Wheezing and rhonchi present. No rales.     Comments: Tachypneic.  Chest:     Chest wall: No tenderness.  Lymphadenopathy:     Cervical: No cervical adenopathy.  Skin:    General: Skin is warm.     Capillary Refill: Capillary refill takes less than 2 seconds.     Coloration: Skin is not pale.     Findings: No abrasion, erythema, petechiae or rash. Rash is not papular, urticarial or vesicular.     Comments: No eczematous lesions noted.   Neurological:     Mental Status: She is alert.  Psychiatric:        Behavior: Behavior is cooperative.     Diagnostic studies: none   IM Depo-Medrol 80 mg given in clinic today.      Salvatore Marvel, MD  [facility]

## 2021-05-22 NOTE — Patient Instructions (Addendum)
1. Asthma-COPD overlap syndrome - Lung testing not done today. - I would COVID test again on Thursday just to be on the safe side (this is 5 days after your first symptoms).  - You were wheezing more than normal. - DepoMedrol 80mg  given in clinic today. - Hold your daily prednisone and start the prednisone dose pack provided today (take your first dose of this tonight). - We are going to change your controller medications. - Stop the Stiolto since you did not feel that this was working.  - Daily controller medication(s): Pulmicort (budesonide) + Performist (formoterol) mixed together in nebulizer TWICE DAILY + prednisone 10mg  daily + Singulair 10mg  daily + Fasenra every 8 weeks - Rescue medications: albuterol nebulizer one vial every 4-6 hours as needed - Asthma control goals:  * Full participation in all desired activities (may need albuterol before activity) * Albuterol use two time or less a week on average (not counting use with activity) * Cough interfering with sleep two time or less a month * Oral steroids no more than once a year * No hospitalizations   2. Chronic rhinitis (horse, tobacco leaf, grasses, ragweed, trees, indoor molds, outdoor molds, dust mites, cat and dog) - Continue with: Zyrtec (cetirizine) 10mg  tablet once daily and Singulair (montelukast) 10mg  daily and Nasacort (triamcinolone) two sprays per nostril daily AS NEEDED - Continue with nasal saline rinses.  3. Follow up in one month and we can still discuss weaning prednisone. We will get there! This is just a small hiccup!    Please inform of any Emergency Department visits, hospitalizations, or changes in symptoms. Call before going to the ED for breathing or allergy symptoms since we might be able to fit you in for a sick visit. Feel free to contact anytime with any questions, problems, or concerns.  It was a pleasure to see you again today!  Websites that have reliable patient information: 1. American  Academy of Asthma, Allergy, and Immunology: www.aaaai.org 2. Food Allergy Research and Education (FARE): foodallergy.org 3. Mothers of Asthmatics: http://www.asthmacommunitynetwork.org 4. American College of Allergy, Asthma, and Immunology: www.acaai.org   COVID-19 Vaccine Information can be found at: For questions related to vaccine distribution or appointments, please email [email or web address] or call [phone].   We realize that you might be concerned about having an allergic reaction to the COVID19 vaccines. To help with that concern, WE ARE OFFERING THE COVID19 VACCINES IN OUR OFFICE! Ask the front desk for dates!     Like [place] on [place] and Instagram for our latest updates!      A healthy democracy works best when [place] participate! Make sure you are registered to vote! If you have moved or changed any of your contact information, you will need to get this updated before voting!  In some cases, you MAY be able to register to vote online: [email or web address]

## 2021-05-22 NOTE — Telephone Encounter (Signed)
I called the patient to inform her that we would be mailing the lab requisition. She plans to get the blood word done soon.

## 2021-05-23 NOTE — Telephone Encounter (Signed)
Thanks, Diandra!   Ariah Mower, MD [facility]  

## 2021-05-24 ENCOUNTER — Ambulatory Visit: Payer: Medicare Other | Admitting: Allergy & Immunology

## 2021-05-25 ENCOUNTER — Encounter (HOSPITAL_COMMUNITY): Payer: Self-pay

## 2021-05-25 ENCOUNTER — Ambulatory Visit (HOSPITAL_COMMUNITY): Admission: RE | Admit: 2021-05-25 | Payer: Medicare Other | Source: Ambulatory Visit

## 2021-05-28 ENCOUNTER — Other Ambulatory Visit: Payer: Self-pay | Admitting: Orthopaedic Surgery

## 2021-05-29 ENCOUNTER — Telehealth: Payer: Self-pay | Admitting: Allergy & Immunology

## 2021-05-29 ENCOUNTER — Ambulatory Visit
Admission: RE | Admit: 2021-05-29 | Discharge: 2021-05-29 | Disposition: A | Discharge status: Home / Self Care | Payer: Medicare Other | Source: Ambulatory Visit | Attending: Family Medicine | Admitting: Family Medicine

## 2021-05-29 ENCOUNTER — Other Ambulatory Visit: Payer: Self-pay

## 2021-05-29 DIAGNOSIS — R140 Abdominal distension (gaseous): Secondary | ICD-10-CM | POA: Insufficient documentation

## 2021-05-29 NOTE — Telephone Encounter (Signed)
Patient called in and states she would like prednisone called in to [facility] in [place].  Patient states she takes them every day and has been for 6 years and she is just about out and doesn't want to go through withdrawals and would like some called in please. Patient requested 90 day supply.

## 2021-05-31 MED ORDER — PREDNISONE 10 MG PO TABS: 10.0000 mg | ORAL_TABLET | Freq: Every day | ORAL | 0 refills | Status: DC

## 2021-05-31 NOTE — Telephone Encounter (Signed)
Left message informing patient that prescription has been sent to the requested pharmacy. Informed patient via message of Dr. Dellis Anes note.

## 2021-05-31 NOTE — Telephone Encounter (Signed)
She is on prednisone 10mg  daily. Please send in a 90 day supply without any refills. I wanted to wean her down, so we can discuss at the next visit.   , MD [facility]

## 2021-06-02 LAB — ASPERGILLUS PRECIPITINS
A.Fumigatus #1 Abs: NEGATIVE
Aspergillus Flavus Antibodies: NEGATIVE
Aspergillus Niger Antibodies: NEGATIVE
Aspergillus glaucus IgG: NEGATIVE
Aspergillus nidulans IgG: NEGATIVE
Aspergillus terreus IgG: NEGATIVE

## 2021-06-02 LAB — ALPHA-1-ANTITRYPSIN: A-1 Antitrypsin: 133 mg/dL (ref 101–187)

## 2021-06-02 LAB — ANCA TITERS
Atypical pANCA: <1:20 {titer}
C-ANCA: <1:20 {titer}
P-ANCA: <1:20 {titer}

## 2021-06-07 ENCOUNTER — Other Ambulatory Visit: Payer: Self-pay

## 2021-06-07 ENCOUNTER — Encounter: Payer: Self-pay | Admitting: Urology

## 2021-06-07 ENCOUNTER — Ambulatory Visit (INDEPENDENT_AMBULATORY_CARE_PROVIDER_SITE_OTHER): Payer: Medicare Other | Admitting: Urology

## 2021-06-07 VITALS — BP 116/69 | HR 77 | Wt 150.0 lb

## 2021-06-07 DIAGNOSIS — R32 Unspecified urinary incontinence: Secondary | ICD-10-CM

## 2021-06-07 DIAGNOSIS — N3000 Acute cystitis without hematuria: Secondary | ICD-10-CM | POA: Diagnosis not present

## 2021-06-07 LAB — MICROSCOPIC EXAMINATION: Renal Epithel, UA: NONE SEEN /hpf

## 2021-06-07 LAB — URINALYSIS, ROUTINE W REFLEX MICROSCOPIC
Bilirubin, UA: NEGATIVE
Glucose, UA: NEGATIVE
Ketones, UA: NEGATIVE
Leukocytes,UA: NEGATIVE
Nitrite, UA: NEGATIVE
Protein,UA: NEGATIVE
Specific Gravity, UA: 1.015 (ref 1.005–1.030)
Urobilinogen, Ur: 0.2 mg/dL (ref 0.2–1.0)
pH, UA: 6.5 (ref 5.0–7.5)

## 2021-06-07 NOTE — Patient Instructions (Signed)

## 2021-06-07 NOTE — Progress Notes (Signed)
06/07/2021 11:52 AM   [name] 1965-04-25 [id number]  Referring provider: Celene Squibb, MD [place],  [place] [address]  Followup OAB   HPI: [name] is a 56yo here for followup for OAB. She is doing really well after Interstim implantation. Setting 2.5. She has a urge incontinent episode 1-2x per month. No other complaints today   PMH: Past Medical History:  Diagnosis Date   Anemia    Anxiety    Arthritis    Asthma    Cervical radiculopathy    Cluster headaches    COPD (chronic obstructive pulmonary disease) (HCC)    COPD (chronic obstructive pulmonary disease) with chronic bronchitis (HCC)    GERD (gastroesophageal reflux disease)    Hyperlipemia    Hypoglycemia    Hypothyroidism    Migraine    Pneumothorax    PONV (postoperative nausea and vomiting)    Pre-diabetes    Rectal discharge 07/28/2010   Shortness of breath    Sleep apnea    cannot tolerate-not using CPAP   Sluggishness 07/28/2010    Surgical History: Past Surgical History:  Procedure Laterality Date   ABDOMINAL HYSTERECTOMY     with bladder suspension   CERVICAL FUSION     CHOLECYSTECTOMY     COLONOSCOPY N/A 07/30/2013   Procedure: COLONOSCOPY;  Surgeon: Rogene Houston, MD;  [facility];  Service: Endoscopy;  Laterality: N/A;  [place] OF SHOULDER Right 10/11/2016   Procedure: EXAM UNDER ANESTHESIA WITH MANIPULATION OF SHOULDER;  Surgeon: Carole Civil, MD;  Location: [facility];  Service: Orthopedics;  Laterality: Right;   HERNIA REPAIR     INCISIONAL HERNIA REPAIR N/A 07/30/2016   Procedure: HERNIA REPAIR INCISIONAL WITH MESH;  Surgeon: Aviva Signs, MD;  Location: [facility];  Service: General;  Laterality: N/A;   INTERSTIM IMPLANT PLACEMENT  02/02/2021   Procedure: Barrie [place] IMPLANT FIRST STAGE LEFT SACRUM ;  Surgeon: Cleon Gustin, MD;  Location: [facility];  Service: Urology;;   Barrie Lyme IMPLANT PLACEMENT  N/A 02/23/2021   Procedure: Barrie Lyme IMPLANT SECOND STAGE;  Surgeon: Cleon Gustin, MD;  Location: [facility];  Service: Urology;  Laterality: N/A;  Rep coming, time needs to stay at 2:00   [place]     right lung    POSTERIOR CERVICAL LAMINECTOMY Right 04/05/2017   Procedure: Right C6-7 Posterior cervical laminectomy;  Surgeon: Kary Kos, MD;  Location: [facility];  Service: Neurosurgery;  Laterality: Right;  Right C6-7 Posterior cervical laminectomy   SHOULDER ARTHROSCOPY WITH ROTATOR CUFF REPAIR Right 10/11/2016   Procedure: SHOULDER ARTHROSCOPY;  Surgeon: Carole Civil, MD;  Location: [facility];  Service: Orthopedics;  Laterality: Right;   TOTAL HIP ARTHROPLASTY Right 04/15/2020   Procedure: RIGHT TOTAL HIP ARTHROPLASTY ANTERIOR APPROACH;  Surgeon: Mcarthur Rossetti, MD;  Location: [facility];  Service: Orthopedics;  Laterality: Right;   TOTAL HIP ARTHROPLASTY Left 07/22/2020   Procedure: LEFT  HIP ARTHROPLASTY ANTERIOR APPROACH;  Surgeon: Mcarthur Rossetti, MD;  Location: [facility];  Service: Orthopedics;  Laterality: Left;  RNFA   TUBAL LIGATION      Home Medications:  Allergies as of 06/07/2021       Reactions   Aspirin Shortness Of Breath   Causes asthma flares    Penicillins Other (See Comments)   UNSPECIFIED REACTION OF CHILDHOOD Has patient had a PCN reaction causing immediate rash, facial/tongue/throat swelling, SOB  or lightheadedness with hypotension: NO Has patient had a PCN reaction causing severe rash involving mucus membranes or skin necrosis: NO Has patient had a PCN reaction that required hospitalization: no Has patient had a PCN reaction occurring within the last 10 years: NO If all of the above answers are "NO", then may proceed with Cephalosporin use.   Percocet [oxycodone-acetaminophen]    migraines   Tramadol    insomnia   Vicodin [hydrocodone-acetaminophen] Other (See Comments)   Headaches         Medication List         Accurate as of June 07, 2021 11:52 AM. If you have any questions, ask your nurse or doctor.          STOP taking these medications    gabapentin 100 MG capsule Commonly known as: NEURONTIN Stopped by: Nicolette Bang, MD   oxyCODONE-acetaminophen 5-325 MG tablet Commonly known as: Percocet Stopped by: Nicolette Bang, MD   sulfamethoxazole-trimethoprim 800-160 MG tablet Commonly known as: BACTRIM DS Stopped by: Nicolette Bang, MD       TAKE these medications    acetaminophen 500 MG tablet Commonly known as: TYLENOL Take 1,000 mg by mouth every 6 (six) hours as needed for moderate pain.   AeroChamber Plus inhaler 1 each by Other route as needed for other (Use with inhalers). Use as instructed   albuterol 108 (90 Base) MCG/ACT inhaler Commonly known as: VENTOLIN HFA Inhale 2 puffs into the lungs every 4 (four) hours as needed for shortness of breath.   aspirin-acetaminophen-caffeine 250-250-65 MG tablet Commonly known as: EXCEDRIN MIGRAINE Take 2 tablets by mouth every 8 (eight) hours as needed for migraine or headache.   budesonide 0.25 MG/2ML nebulizer solution Commonly known as: PULMICORT mixed together with perforomist in nebulizer TWICE DAILY   calcium carbonate 750 MG chewable tablet Commonly known as: TUMS EX Chew 2 tablets by mouth daily as needed for heartburn.   cetirizine 10 MG tablet Commonly known as: ZYRTEC Take 10 mg by mouth daily.   docusate sodium 100 MG capsule Commonly known as: COLACE Take 100-200 mg by mouth daily as needed for mild constipation.   famotidine 20 MG tablet Commonly known as: PEPCID Take 20 mg by mouth daily as needed for heartburn or indigestion.   Fasenra 30 MG/ML Sosy Generic drug: Benralizumab Inject 30 mg into the skin every 8 (eight) weeks.   formoterol 20 MCG/2ML nebulizer solution Commonly known as: PERFOROMIST mixed together with pulmicort in nebulizer TWICE DAILY   levothyroxine 75 MCG  tablet Commonly known as: SYNTHROID Take 0.5 tablets (37.5 mcg total) by mouth daily before breakfast.   LORazepam 1 MG tablet Commonly known as: ATIVAN Take 1.5 mg by mouth 2 (two) times daily.   montelukast 10 MG tablet Commonly known as: SINGULAIR Take 1 tablet (10 mg total) by mouth at bedtime.   multivitamin tablet Take 1 tablet by mouth every evening.   NASACORT ALLERGY 24HR NA Place 2 sprays into the nose daily as needed (allergies).   OneTouch Delica Plus 0000000 Misc Apply topically.   pantoprazole 40 MG tablet Commonly known as: PROTONIX Take 30- 60 min before your first and last meals of the day What changed:  how much to take how to take this when to take this   pravastatin 40 MG tablet Commonly known as: PRAVACHOL Take 40 mg by mouth at bedtime.   predniSONE 10 MG tablet Commonly known as: DELTASONE Take 1 tablet (10 mg total) by mouth daily.  pregabalin 75 MG capsule Commonly known as: LYRICA Take 1 capsule by mouth twice daily   Arlyce Harman PD Devi 1 applicator by Does not apply route as needed.   SUMAtriptan 6 MG/0.5ML Soaj Inject 6 mg as directed daily as needed (migraine).   valACYclovir 500 MG tablet Commonly known as: VALTREX Take 500-2,000 mg by mouth See admin instructions. Take 2000mg s at first sign of fever blister outbreak then take 500mg s daily until gone   Vitamin D3 Super Strength 50 MCG (2000 UT) Caps Generic drug: Cholecalciferol Take 2,000 Units by mouth daily.        Allergies:  Allergies  Allergen Reactions   Aspirin Shortness Of Breath    Causes asthma flares    Penicillins Other (See Comments)    UNSPECIFIED REACTION OF CHILDHOOD  Has patient had a PCN reaction causing immediate rash, facial/tongue/throat swelling, SOB or lightheadedness with hypotension: NO Has patient had a PCN reaction causing severe rash involving mucus membranes or skin necrosis: NO Has patient had a PCN reaction that required hospitalization:  no Has patient had a PCN reaction occurring within the last 10 years: NO If all of the above answers are "NO", then may proceed with Cephalosporin use.    Percocet [Oxycodone-Acetaminophen]     migraines   Tramadol     insomnia   Vicodin [Hydrocodone-Acetaminophen] Other (See Comments)    Headaches     Family History: Family History  Problem Relation Age of Onset   COPD Mother    Diabetes Mother    Hyperlipidemia Mother    Hypertension Mother    Bipolar disorder Son    Heart failure Maternal Grandmother    Hypertension Maternal Grandmother    Thyroid disease Maternal Grandmother    Diabetes Paternal Grandmother    Alzheimer's disease Paternal Grandmother    Heart failure Paternal Grandfather    Hypertension Paternal Grandfather    Hypertension Father    Colon cancer Neg Hx     Social History:  reports that she quit smoking about 13 years ago. Her smoking use included cigarettes. She has a 30.00 pack-year smoking history. She has never used smokeless tobacco. She reports that she does not drink alcohol and does not use drugs.  ROS: All other review of systems were reviewed and are negative except what is noted above in HPI  Physical Exam: BP 116/69    Pulse 77    Wt 150 lb (68 kg)    BMI 25.75 kg/m   Constitutional:  Alert and oriented, No acute distress. HEENT:  AT, moist mucus membranes.  Trachea midline, no masses. Cardiovascular: No clubbing, cyanosis, or edema. Respiratory: Normal respiratory effort, no increased work of breathing. GI: Abdomen is soft, nontender, nondistended, no abdominal masses GU: No CVA tenderness.  Lymph: No cervical or inguinal lymphadenopathy. Skin: No rashes, bruises or suspicious lesions. Neurologic: Grossly intact, no focal deficits, moving all 4 extremities. Psychiatric: Normal mood and affect.  Laboratory Data: Lab Results  Component Value Date   WBC 11.2 (H) 01/31/2021   HGB 10.8 (L) 01/31/2021   HCT 34.6 (L) 01/31/2021   MCV  92.5 01/31/2021   PLT 320 01/31/2021    Lab Results  Component Value Date   CREATININE 0.93 07/23/2020    No results found for: PSA  No results found for: TESTOSTERONE  Lab Results  Component Value Date   HGBA1C 6.0 (H) 07/13/2020    Urinalysis    Component Value Date/Time   APPEARANCEUR Clear 03/15/2021 1521   GLUCOSEU  Negative 03/15/2021 1521   BILIRUBINUR Negative 03/15/2021 1521   PROTEINUR Negative 03/15/2021 1521   NITRITE Negative 03/15/2021 1521   LEUKOCYTESUR Negative 03/15/2021 1521    Lab Results  Component Value Date   LABMICR See below: 03/15/2021   WBCUA 0-5 03/15/2021   LABEPIT 0-10 03/15/2021   BACTERIA Few 03/15/2021    Pertinent Imaging:  Results for orders placed during the hospital encounter of 06/11/19  DG Abd 1 View  Narrative CLINICAL DATA:  Partial intestinal obstruction.  EXAM: ABDOMEN - 1 VIEW  COMPARISON:  None.  FINDINGS: Bowel gas pattern is normal without evidence of ileus or obstruction on this single view. No abnormal calcifications or acute bone findings.  IMPRESSION: Unremarkable one-view abdominal radiograph.   Electronically Signed By: Nelson Chimes M.D. On: 06/11/2019 15:39  No results found for this or any previous visit.  No results found for this or any previous visit.  No results found for this or any previous visit.  No results found for this or any previous visit.  No results found for this or any previous visit.  No results found for this or any previous visit.  No results found for this or any previous visit.   Assessment & Plan:    1. Urinary incontinence, unspecified type -Patient doing well after interstim. RTC 1 year - Urinalysis, Routine w reflex microscopic     No follow-ups on file.  Nicolette Bang, MD  [facility] [place]

## 2021-06-14 ENCOUNTER — Other Ambulatory Visit: Payer: Self-pay

## 2021-06-14 ENCOUNTER — Ambulatory Visit (INDEPENDENT_AMBULATORY_CARE_PROVIDER_SITE_OTHER): Payer: Medicare Other

## 2021-06-14 DIAGNOSIS — J4550 Severe persistent asthma, uncomplicated: Secondary | ICD-10-CM | POA: Diagnosis not present

## 2021-06-19 ENCOUNTER — Encounter: Payer: Self-pay | Admitting: Allergy & Immunology

## 2021-06-19 ENCOUNTER — Telehealth: Payer: Self-pay | Admitting: "Endocrinology

## 2021-06-19 ENCOUNTER — Other Ambulatory Visit: Payer: Self-pay | Admitting: "Endocrinology

## 2021-06-19 ENCOUNTER — Ambulatory Visit: Payer: Medicare Other | Admitting: Allergy & Immunology

## 2021-06-19 ENCOUNTER — Other Ambulatory Visit: Payer: Self-pay

## 2021-06-19 VITALS — BP 112/70 | HR 80 | Temp 98.2°F | Resp 14 | Wt 148.0 lb

## 2021-06-19 DIAGNOSIS — Z9981 Dependence on supplemental oxygen: Secondary | ICD-10-CM

## 2021-06-19 DIAGNOSIS — J302 Other seasonal allergic rhinitis: Secondary | ICD-10-CM

## 2021-06-19 DIAGNOSIS — J3089 Other allergic rhinitis: Secondary | ICD-10-CM

## 2021-06-19 DIAGNOSIS — D849 Immunodeficiency, unspecified: Secondary | ICD-10-CM | POA: Insufficient documentation

## 2021-06-19 DIAGNOSIS — J449 Chronic obstructive pulmonary disease, unspecified: Secondary | ICD-10-CM

## 2021-06-19 DIAGNOSIS — F1920 Other psychoactive substance dependence, uncomplicated: Secondary | ICD-10-CM

## 2021-06-19 DIAGNOSIS — E782 Mixed hyperlipidemia: Secondary | ICD-10-CM

## 2021-06-19 NOTE — Progress Notes (Signed)
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 Fransico Him for help with weaning of prednisone)   Seasonal and perennial allergic rhinitis (horse, tobacco leaf, grasses, ragweed, trees, indoor molds, outdoor molds, dust mites, cat and dog)   On disability   Overall, [name] is doing quite well.  I am never seen her in such a good clinical state.  Her mood is upbeat and her spirometry is normal.  We are going to wean down to 7.5 mg of prednisone daily.  She already has a pill splitter at home and feels comfortable cutting her current dose into 4 and taking 3 of them.  When she uses up all of the prednisone she has at the house we can send in 2.5 mg tablets that she can take 3 of them at a time for a total of 7.5 mg.  Given her history of blacking out when she weaned her prednisone before, I would like to get endocrinology's input on the best way to wean her prednisone and evaluate her endogenous hormone production.  There is always a possibility that we may not be able to get her down to no prednisone daily.  Plan/Recommendations:    1. Asthma-COPD overlap syndrome - Lung testing looks stable today.  - Since you have been stable, we are going to change you to prednisone 7.5 mg daily (cut your pulls into 4ths and take three of them). - Call us if you need the new script and we can send it in the new script. - I am going to talk to Dr. Fransico Him from Endocrinology to see whether we need to do more evaluation of your baseline steroid production from your adrenal glands (since you have been on prednisone for so long).  - We may send you there to see him as well.  - Daily controller medication(s): Pulmicort (budesonide) +  Performist (formoterol) mixed together in nebulizer TWICE DAILY + prednisone 10mg  daily + Singulair 10mg  daily + Fasenra every 8 weeks - Rescue medications: albuterol 4 puffs every 4-6 hours as needed or albuterol nebulizer one vial every 4-6 hours as needed - Asthma control goals:  * Full participation in all desired activities (may need albuterol before activity) * Albuterol use two time or less a week on average (not counting use with activity) * Cough interfering with sleep two time or less a month * Oral steroids no more than once a year * No hospitalizations   2. Chronic rhinitis (horse, tobacco leaf, grasses, ragweed, trees, indoor molds, outdoor molds, dust mites, cat and dog) - Continue with: Zyrtec (cetirizine) 10mg  tablet once daily and Singulair (montelukast) 10mg  daily and Nasacort (triamcinolone) two sprays per nostril daily AS NEEDED - Continue with nasal saline rinses.  3. Follow up in four months or earlier if needed.   Subjective:   [name] is a 56 y.o. female presenting today for follow up of No chief complaint on file.   [name] has a history of the following: Patient Active Problem List   Diagnosis Date Noted   Radiculopathy, lumbar region 12/08/2020   Status post left hip replacement 07/22/2020   Status post right hip replacement 05/02/2020   Status post total  replacement of right hip 04/15/2020   Avascular necrosis of bone of left hip (HCC) 02/10/2020   Avascular necrosis of bone of right hip (HCC) 02/10/2020   Allergic rhinitis 05/21/2019   Chronic asthmatic bronchitis (HCC) 04/30/2019   Chronic respiratory failure with hypoxia (HCC) 04/30/2019   Spinal stenosis of cervical region 04/05/2017   Partial tear of right subscapularis tendon    Incisional hernia, without obstruction or gangrene    Urinary frequency 03/21/2012   Migraine equivalent syndrome 03/21/2012   Insomnia 03/21/2012    History obtained from: chart review and  patient.  [name] is a 56 y.o. female presenting for a follow up visit.  She was last seen in February 2023.  At that time, we did not do lung testing.  She was wheezing more than normal.  We gave her a Depo-Medrol.  We held her daily prednisone and started a prednisone burst instead.  We stopped her Stiolto since that I feel that is working.  We moved her to nebulized medications including Pulmicort and Perforomist twice daily as well as the Singulair daily and the Fasenra every 8 weeks.  She remained on her prednisone 10 mg daily.  For her rhinitis, we continue with Zyrtec and Singulair as well as Nasacort as needed.  Since the last visit, she did well. She did retest for COVID19 and this was negative after the last visit.  Asthma/Respiratory Symptom History: She is using her nebulized treatments BID as recommended. She reports that this is working very well.  She is concerned with the prednisone. She has been on Singulair. She has been on it for years and has never forgotten.  She feels quite a bit better on the nebulized medications compared to the inhaler.  She remains on the [place] which has helped her quite a bit.  She is going to see Dr. Sherene Sires in the next week or so.  He manages her oxygen.  She remains motivated to get off of her prednisone.  She tells me that she tried to get off once several years ago and ended up having shortness of breath episodes and "blacked out".  She was put back on 10 mg daily and has been on that for well over a decade.  She thinks she started the prednisone back in 1995.  The blacking out episode occurred after 1995 when she was trying to wean off of it herself.  She has never seen endocrinology for management of this weaning process.  She is likely going to be getting bilateral knee replacements for avascular necrosis in the next few years.  She had her hips replaced last year due to avascular necrosis.  Allergic Rhinitis Symptom History: She remains on her  Singulair as well as Zyrtec.  She does not use the Nasacort much at all.  She has not needed antibiotics in quite some time.    She has been on GOLO diet plan which has helped her to lose her weight and change her outlook and attitude. She feels "more alive" because she is eating better.  Apparently there is some kind of medication she takes after meals to help the food absorb slower into her system.  She tells me it is all natural.  Regardless, she definitely feels a lot better on this diet plan and does feel more energetic.  She is mostly using it to feel more energetic and avoid diabetes.  Otherwise, there have been no changes to her past medical history, surgical history, family history, or social  history.    Review of Systems  Constitutional: Negative.  Negative for chills, fever, malaise/fatigue and weight loss.  HENT:  Positive for congestion. Negative for ear discharge and ear pain.   Eyes:  Negative for pain, discharge and redness.  Respiratory:  Positive for shortness of breath. Negative for cough, sputum production and wheezing.   Cardiovascular: Negative.  Negative for chest pain and palpitations.  Gastrointestinal:  Negative for abdominal pain, constipation, diarrhea, heartburn, nausea and vomiting.  Skin: Negative.  Negative for itching and rash.  Neurological:  Negative for dizziness and headaches.  Endo/Heme/Allergies:  Negative for environmental allergies. Does not bruise/bleed easily.      Objective:   Blood pressure 112/70, pulse 80, temperature 98.2 F (36.8 C), temperature source Temporal, resp. rate 14, weight 148 lb (67.1 kg), SpO2 98 %. Body mass index is 25.4 kg/m.    Physical Exam Vitals reviewed.  Constitutional:      Appearance: She is well-developed.     Comments: Wearing oxygen.  Smiling.  Cooperative with the exam.  Seems more optimistic than normal.  HENT:     Head: Normocephalic and atraumatic.     Right Ear: Tympanic membrane, ear canal and  external ear normal.     Left Ear: Tympanic membrane, ear canal and external ear normal.     Nose: No nasal deformity, septal deviation, mucosal edema or rhinorrhea.     Right Turbinates: Enlarged and swollen.     Left Turbinates: Enlarged and swollen.     Right Sinus: No maxillary sinus tenderness or frontal sinus tenderness.     Left Sinus: No maxillary sinus tenderness or frontal sinus tenderness.     Comments: Nares dry from the oxygen cannula.    Mouth/Throat:     Mouth: Mucous membranes are not pale and not dry.     Pharynx: Uvula midline.  Eyes:     General: Lids are normal. No allergic shiner.       Right eye: No discharge.        Left eye: No discharge.     Conjunctiva/sclera: Conjunctivae normal.     Right eye: Right conjunctiva is not injected. No chemosis.    Left eye: Left conjunctiva is not injected. No chemosis.    Pupils: Pupils are equal, round, and reactive to light.  Cardiovascular:     Rate and Rhythm: Normal rate and regular rhythm.     Heart sounds: Normal heart sounds.  Pulmonary:     Effort: Pulmonary effort is normal. No tachypnea, accessory muscle usage or respiratory distress.     Breath sounds: Normal breath sounds. No wheezing, rhonchi or rales.     Comments: Air movement is better than normal.  I am able to hear breath sounds at the bases. Chest:     Chest wall: No tenderness.  Lymphadenopathy:     Cervical: No cervical adenopathy.  Skin:    General: Skin is warm.     Capillary Refill: Capillary refill takes less than 2 seconds.     Coloration: Skin is not pale.     Findings: No abrasion, erythema, petechiae or rash. Rash is not papular, urticarial or vesicular.     Comments: No eczematous lesions noted.   Neurological:     Mental Status: She is alert.  Psychiatric:        Behavior: Behavior is cooperative.     Diagnostic studies:    Spirometry: results normal (FEV1: 1.91/74%, FVC: 2.56/82%, FEV1/FVC: 72%).    Spirometry consistent  with  normal pattern.    Allergy Studies: none        Malachi BondsJoel Zinnia Tindall, MD  [facility]

## 2021-06-19 NOTE — Telephone Encounter (Signed)
Dr Dorris Fetch, ?Can you review this chart? I received a referral on her for " steroid dependent ".. Please advise ? ?Thank you ?

## 2021-06-19 NOTE — Patient Instructions (Addendum)
1. Asthma-COPD overlap syndrome ?- Lung testing looks stable today.  ?- Since you have been stable, we are going to change you to prednisone 7.5 mg daily (cut your pulls into 4ths and take three of them). ?- Call us if you need the new script and we can send it in the new script. ?- I am going to talk to Dr. Fransico Him from Endocrinology to see whether we need to do more evaluation of your baseline steroid production from your adrenal glands (since you have been on prednisone for so long).  ?- We may send you there to see him as well.  ?- Daily controller medication(s): Pulmicort (budesonide) + Performist (formoterol) mixed together in nebulizer TWICE DAILY + prednisone 10mg  daily + Singulair 10mg  daily + Fasenra every 8 weeks ?- Rescue medications: albuterol 4 puffs every 4-6 hours as needed or albuterol nebulizer one vial every 4-6 hours as needed ?- Asthma control goals:  ?* Full participation in all desired activities (may need albuterol before activity) ?* Albuterol use two time or less a week on average (not counting use with activity) ?* Cough interfering with sleep two time or less a month ?* Oral steroids no more than once a year ?* No hospitalizations ?  ?2. Chronic rhinitis (horse, tobacco leaf, grasses, ragweed, trees, indoor molds, outdoor molds, dust mites, cat and dog) ?- Continue with: Zyrtec (cetirizine) 10mg  tablet once daily and Singulair (montelukast) 10mg  daily and Nasacort (triamcinolone) two sprays per nostril daily AS NEEDED ?- Continue with nasal saline rinses. ? ?3. Follow up in four months or earlier if needed. ? ? ?Please inform of any Emergency Department visits, hospitalizations, or changes in symptoms. Call before going to the ED for breathing or allergy symptoms since we might be able to fit you in for a sick visit. Feel free to contact anytime with any questions, problems, or concerns. ? ?It was a pleasure to see you again today! ? ?Websites that have reliable patient  information: ?1. American Academy of Asthma, Allergy, and Immunology: www.aaaai.org ?2. Food Allergy Research and Education (FARE): foodallergy.org ?3. Mothers of Asthmatics: http://www.asthmacommunitynetwork.org ?4. of Allergy, Asthma, and Immunology: [place] ? ? ?COVID-19 Vaccine Information can be found at: [place] For questions related to vaccine distribution or appointments, please email [email or web address] or call [phone].  ? ?We realize that you might be concerned about having an allergic reaction to the COVID19 vaccines. To help with that concern, WE ARE OFFERING THE COVID19 VACCINES IN OUR OFFICE! Ask the front desk for dates!  ? ? ? ??Like? [employer] on Facebook and Instagram for our latest updates!  ?  ? ? ?A healthy democracy works best when [email or web address] participate! Make sure you are registered to vote! If you have moved or changed any of your contact information, you will need to get this updated before voting! ? ?In some cases, you MAY be able to register to vote online: [email or web address] ? ? ? ? ? ? ? ?

## 2021-06-25 ENCOUNTER — Other Ambulatory Visit: Payer: Self-pay | Admitting: Orthopaedic Surgery

## 2021-06-28 ENCOUNTER — Telehealth: Payer: Self-pay | Admitting: Allergy & Immunology

## 2021-06-28 NOTE — Telephone Encounter (Signed)
Patient states the performist and pulmicort is too expensive for her. Patient was advised to ask our office what other alternative she can take is. Patient does not want a new prescription sent yet, she would like to know the name of the alternative so that she may speak to her insurance about the cost.  ? ?Please advise ? ?Best contact number: [phone] ?

## 2021-06-29 ENCOUNTER — Encounter: Payer: Self-pay | Admitting: Internal Medicine

## 2021-06-29 ENCOUNTER — Other Ambulatory Visit: Payer: Self-pay | Admitting: *Deleted

## 2021-06-29 ENCOUNTER — Ambulatory Visit: Payer: Medicare Other | Admitting: Internal Medicine

## 2021-06-29 ENCOUNTER — Other Ambulatory Visit: Payer: Self-pay

## 2021-06-29 DIAGNOSIS — J9611 Chronic respiratory failure with hypoxia: Secondary | ICD-10-CM | POA: Diagnosis not present

## 2021-06-29 DIAGNOSIS — J449 Chronic obstructive pulmonary disease, unspecified: Secondary | ICD-10-CM | POA: Diagnosis not present

## 2021-06-29 DIAGNOSIS — Z87891 Personal history of nicotine dependence: Secondary | ICD-10-CM | POA: Diagnosis not present

## 2021-06-29 NOTE — Progress Notes (Signed)
? ?@Patient  ID: [name], female    DOB: 08-30-1965     MRN: [id number] ? ?  ?Referring provider: ?[id number], MD ? ?Brief patient profile:  ?18 yowf  Quit smoking 2009  previously  followed for COPD GOLD II (but really more of an AB /eosinophilic phenotype with pfts nearly nl p saba), dx of chronic hypoxic respiratory failure on oxygen at 4 L and chronic rhinitis ?Medical history significant for GERD ? ?Tension Pneumothorax 12/2001 s/p VATS w/ mechanical pleurodesis on right -Dr. 01/2002  ? ? ?TEST/EVENTS :  ?IgE 05/27/2018   3590 with Eosinophil count 800/mcL > 05/29/2018  ? ?07/29/2019 Follow-up : COPD, oxygen dependent respiratory failure, asthma, chronic rhinitis ?Patient returns for a 72-month follow-up.  Patient has underlying moderate COPD with allergic asthma (eosinophilic phenotype).  Last visit she continued to have increased symptom burden.  She was continued on DuoNeb 4 times daily.  Budesonide twice daily.  Last visit she was started on prednisone 10 mg daily .  She says she does feel that this has helped with her breathing.  She still gets very winded with minimal activity.  ?Patient had PFTs show stable lung function since 2019.  Patient has moderate airflow obstruction and restriction with FEV1 at 62%, ratio 66, FVC 74%, significant bronchodilator response with positive reversibility, 27% change, significant mid flow reversibility (137% change), DLCO improved at 65%. ?Patient remains on oxygen at 4 L.  Previous chest x-ray November 2020 showed clear lungs.  He denies any increased cough congestion or wheezing. ? ?Patient is on disability which was approved and March 2021 unfortunately she does not have insurance.  She can afford her current nebulizer regimen. ?Is her medications through the good Rx ?rec ?Continue on Duoneb Four times a day   ?Continue on Budesonide Neb Twice daily   ?Continue on Prednisone 10mg  daily  ?Low sweet and salt diet  ?Activity as tolerated.  ?Continue on Oxygen on  4l/m  ? ? ?11/17/2019  Consultaton as 2nd opinion/[place] office/[name] re: AB/ 02 dep resp failure  ?Chief Complaint  ?Patient presents with  ? Consult  ?  Shortness of breath with exertion,   ?Dyspnea:  Doesn't do grocery shopping x sev years  ?[place] time walked was [place] "before got  Hot" on 4lpm and sats 93% she reports while walking  ?Cough: esp in am / gray  ?Sleeping: ativan at hs / on side bed is flat/ 2 pillows / 3 x weekly wakes up feeling need for more saba ?SABA use: "all the time"  ?02: 4lpm 24/7 though POC at 4 pulsed outside home  ?Prednisone since jan 2021 but out x sev weeks and much worse even on bud neb ?rec ?Make sure you check your oxygen saturations at highest level of activity to be sure it stays over 90% and adjust upward to maintain this level if needed but remember to turn it back to previous settings when you stop (to conserve your supply).  ?Stop budesonide and duoneb  ?Plan A = Automatic = Always=    Stiolto 2 puffs first each am  ?Prednisone 10 mg 2 until better then 1 daily  ?Singulair 10 mg each pm  ?Plan B = Backup (to supplement plan A, not to replace it) ?Only use your albuterol inhaler as a rescue medication  ?Plan C = Crisis (instead of Plan B but only if Plan B stops working) ?- only use your albuterol nebulizer if you first try Plan B and  it fails to help > ok to use the nebulizer up to every 4 hours but if start needing it regularly call for immediate appointment ? ? ?01/13/2020  f/u ov/[place] office/[name] re: AB / stiolto and pred 10 mg daily  ?Chief Complaint  ?Patient presents with  ? Follow-up  ?  shortness of breath with activity  ?Dyspnea: limited by R > L hip not sob ?Cough: minimal > mucoid ?Sleeping: bed is flat several pillows  ?SABA use: less ventolin but less active / rarely neb  ?02: 4lpm 24/7  ?Likes stiolto but can't afford on her insurance and has also tried /failed trelegy and breztri due to either cost or upper airway side effects ?rec ?When you  get medicare, strongly advise you to get medicare with commercial supplement  ?Plan A = Automatic = Always=   General  symbicort 80 Take 2 puffs first thing in am and then another 2 puffs about 12 hours later.  (or stiolto 2 puffs each am but not both)  ?Work on inhaler technique: ?Ok to go back to [employer]oneb and pulmicort  like you were before as your maintenance  ?Plan B = Backup (to supplement plan A, not to replace it) ?Only use your albuterol inhaler as a rescue medication to be used if you can't catch your breath by resting or doing a relaxed purse lip breathing pattern.  ?- The less you use it, the better it will work when you need it. ?- Ok to use the inhaler up to 2 puffs  every 4 hours if you must but call for appointment if use goes up over your usual need ?- Don't leave home without it !!  (think of it like the spare tire for your car)  ?Plan C = Crisis (instead of Plan B but only if Plan B stops working) ?- only use your albuterol nebulizer if you first try Plan B and it fails to help > ok to use the nebulizer up to every 4 hours but if start needing it regularly call for immediate appointment ?Plan D = Deltasone ?If doing worse despite ABC then try taking prednisone 2 until better then hold at 10 mg daily for a week then try 5mg      ? ? ?03/07/2020  f/u ov/[place] office/[name] re: AB stiolto /pred 10 mg one whole, never able to try one half  ?Chief Complaint  ?Patient presents with  ? Follow-up  ?  Patient needs surgical clearance for hip surgery, wears 4 liters oxygen all the time. Wants samples of stiolto   ?Dyspnea: stops walking walking 50 ft hip > breathing stops her ?Cough: none ?Sleeping: bed is flat, neck pillow  ?SABA use: hfa sev times a week/ neb less but last 03/05/20  ?02: 4lpm not checking sats   ?rec ?Symbicort or stiolto daily but not both ?Work on inhaler technique: ?When breathing is bad take prednisone 20 mg daily and 10 mg daily otherwise - take all of it first thing in am  ?Please  remember to go to the lab department [facility] for your tests - we will call you with the results when they are available. ? ? ? ?06/02/2020  f/u ov/[place] office/[name] re: AB stiolto samples plus pred 10 mg daily as can't afford nucala which is what Dr Dellis AnesGallagher rec  ?Chief Complaint  ?Patient presents with  ? Follow-up  ?  Breathing is overall doing well. She states that her cough has been "raspy"- prod in the am  with clear to yellow sputum.  She is using her albuterol inhaler 4-5 x per wk and she has not used neb.   ?Dyspnea:  Just started back walking p Apr 15 2020 THR  R did fine and planning L side next  ?Cough: usual am cough  ?Sleeping: flat bed neck pillow does fine  ?SABA use: as above  ?02: 3 lpm 24/7  ?Covid status: vax x 3  ?Lung cancer screening: n/a ?Rec ?No change on medications  ?I would Dr Ellouise Newer office to ask what insurance works the best for [employer] or whatever they recommend you start on  ?Please schedule a follow up visit in 3 months but call sooner if needed  ? ? ?12/30/2020  f/u ov/[place] office/[name] re: AB maint on stiolto 2 in am/ fasenra started 12/26/20 and pred at 10 mg daily   ?Chief Complaint  ?Patient presents with  ? Follow-up  ?  3L O2 all of the time including while sleeping. ?SOB is mainly with exertion. No cough to report.  ?  ?Dyspnea:  doing PT / walking treadmill x 1.5 mph x 15 min x flat with sats 85 % but not titrating as rec  ?Cough: minimal in am > clear mucus ?Sleeping: bed is flat/ 2 pillow on side does fine ?SABA use: maybe once a day  ?02: 3lpm cont and 3lpm POC ?Covid status: vax x 3  ?Rec ?Make sure you check your oxygen saturation  at your highest level of activity  to be sure it stays over 90% ?Prednisone "ceiling" for now is 20 mg until better, then 10 mg daily is the floor but the floor should be able to be reduced as you respond to fosenra  ? ? ? ?06/29/2021  f/u ov/[place] office/[name] re: AB maint on Prednisone 7.5   ?No chief complaint on  file. ?Dyspnea:  PT / treadmill going well with 02 sats 93% on 3lpm  ?Cough: not a big problem ?Sleeping: flat 2 pillows  ?SABA use: maybe twice dialy typically use it after ex or cough  ?02: 3lpm hs dry

## 2021-06-29 NOTE — Assessment & Plan Note (Signed)
11/17/2019   Walked 4lpm her POC  approx   300 ft  @ slow/[place]  stopped due to  Sob and R hip and leg pain with sats still 98%    ? ?As of 06/29/2021  rec 3lpm 24/7 though ok to tritrate on portable 02 to sats >  90%  And added humidity to concentrator  ? ?Pulmonary f/u is prn  ? ? ?    ?  ? ?Each maintenance medication was reviewed in detail including emphasizing most importantly the difference between maintenance and prns and under what circumstances the prns are to be triggered using an action plan format where appropriate. ? ?Total time for H and P, chart review, counseling, reviewing neb/hfa/02 device(s) and generating customized AVS unique to this final summary  office visit / same day charting =31 min ?     ?

## 2021-06-29 NOTE — Telephone Encounter (Signed)
Will call and speak with Caryl Pina with [facility] tomorrow to determine best steps to send this medication in for the patient before calling the patient.  ?

## 2021-06-29 NOTE — Assessment & Plan Note (Addendum)
Quit smoking 2009  / steroid dep since Jan 2021  ?- IgE 05/27/2018   3590 with Eosinophil count 800/mcL > [facility]  ?- PFT's 07/29/2019  FEV1 2.25 (79 % ) ratio 0.70  p 27 % improvement from saba p 0 prior to study with DLCO  14.30 (65%) corrects to 3.87 (91%)  for alv volume and FV curve min curvature   ?- 11/17/2019    try stiolto x 6 weeks and pred 20  Until better then 10 mg daily plus maint singulair  ?- Labs ordered 03/07/2020   alpha one AT phenotype  MM ?- 03/06/20 IgE  1115  ?- 12/26/20 started Fasenra ?-12/30/2020  After extensive coaching inhaler device,  effectiveness =    90% with smi > continue stiolto with pred ceiling 20 and floor 10  ?- 06/29/2021 maint on performist/bud per DR Dellis Anes, pulmonary f/u is prn as working with DR G and endocrine to wean pred to lowest possible dose(which I strongly supported today)  ? ?Since she is more asthma than copd at this point ok off LAMA and f/u as above  ? ?Re saba use: ?Re SABA :  I spent extra time with pt today reviewing appropriate use of albuterol for prn use on exertion with the following points: ?1) saba is for relief of sob that does not improve by walking a slower pace or resting but rather if the pt does not improve after trying this first. ?2) If the pt is convinced, as many are, that saba helps recover from activity faster then it's easy to tell if this is the case by re-challenging : ie stop, take the inhaler, then p 5 minutes try the exact same activity (intensity of workload) that just caused the symptoms and see if they are substantially diminished or not after saba ?3) if there is an activity that reproducibly causes the symptoms, try the saba 15 min before the activity on alternate days  ? ?If in fact the saba really does help, then fine to continue to use it prn but advised may need to look closer at the maintenance regimen being used to achieve better control of airways disease with exertion.  ?

## 2021-06-29 NOTE — Assessment & Plan Note (Signed)
Quit smoking 2009  ? ?Low-dose CT lung cancer screening is recommended for patients who ?are 41-58 years of age with a 20+ pack-year history of smoking and ?who are currently smoking or quit <=15 years ago. ?No coughing up blood  ?No unintentional weight loss of > 15 pounds in the last 6 months  ?>>> referred for early decision making  ?

## 2021-06-29 NOTE — Telephone Encounter (Signed)
She is double covered with Medicare and BCBS. Are we sure that they are running it correctly through Medicare Part B? Should we send it through [facility]?  ? ?Malachi Bonds, MD ?[facility] ? ?

## 2021-06-29 NOTE — Patient Instructions (Addendum)
I will order humidity be added to your 02  ? ?I will refer you to our lung cancer screening problem ? ?Make sure you check your oxygen saturation  AT  your highest level of activity (not after you stop)   to be sure it stays over 90% and adjust  02 flow upward to maintain this level if needed but remember to turn it back to previous settings when you stop (to conserve your supply).  ? ?Ok to try albuterol 15 min before an activity (on alternating days)  that you know would usually make you short of breath and see if it makes any difference and if makes none then don't take albuterol after activity unless you can't catch your breath as this means it's the resting that helps, not the albuterol. ?    ? ? If you are satisfied with your treatment plan,  let your doctor know and he/she can either refill your medications or you can return here when your prescription runs out.   ? ? If in any way you are not 100% satisfied,  please tell us.  If 100% better, tell your friends! ? ?Pulmonary follow up is as needed   ?

## 2021-06-30 ENCOUNTER — Other Ambulatory Visit: Payer: Self-pay | Admitting: *Deleted

## 2021-06-30 MED ORDER — FORMOTEROL FUMARATE 20 MCG/2ML IN NEBU: INHALATION_SOLUTION | RESPIRATORY_TRACT | 2 refills | Status: DC

## 2021-06-30 MED ORDER — BUDESONIDE 0.25 MG/2ML IN SUSP: RESPIRATORY_TRACT | 2 refills | Status: DC

## 2021-06-30 NOTE — Telephone Encounter (Signed)
Received a call from [facility] stating that they reached out and spoke with the patient and advised that she is paying more filling the medication at [facility] and they can distribute it to her for cheaper. Patient was hesitant and was not sure if she wanted to do that. [facility] representative offered to help her qualify for the patient assistance program, patient was hesitant about that and wanted to think it over. Representative mailed assistance paperwork to her home to review. Patient is going to review the medication. At this time she is not going to fill the medication.  ?

## 2021-06-30 NOTE — Telephone Encounter (Signed)
Patient returned call and I informed her of Ashleigh's message. She will be calling [facility] to figure out cost.  ?

## 2021-06-30 NOTE — Telephone Encounter (Signed)
Prescriptions have been printed and faxed to [place] at [phone]. Called and left a voicemail asking for patient to return call to inform. She can call [facility] at [phone] to discuss how much the cost would be.  ?

## 2021-07-04 ENCOUNTER — Other Ambulatory Visit: Payer: Self-pay

## 2021-07-04 ENCOUNTER — Encounter: Payer: Self-pay | Admitting: "Endocrinology

## 2021-07-04 ENCOUNTER — Ambulatory Visit: Payer: Medicare Other | Admitting: "Endocrinology

## 2021-07-04 VITALS — BP 90/66 | HR 76 | Ht 64.0 in | Wt 147.2 lb

## 2021-07-04 DIAGNOSIS — T380X5A Adverse effect of glucocorticoids and synthetic analogues, initial encounter: Secondary | ICD-10-CM | POA: Diagnosis not present

## 2021-07-04 DIAGNOSIS — E039 Hypothyroidism, unspecified: Secondary | ICD-10-CM

## 2021-07-04 DIAGNOSIS — R7303 Prediabetes: Secondary | ICD-10-CM

## 2021-07-04 DIAGNOSIS — E273 Drug-induced adrenocortical insufficiency: Secondary | ICD-10-CM

## 2021-07-04 DIAGNOSIS — E349 Endocrine disorder, unspecified: Secondary | ICD-10-CM | POA: Insufficient documentation

## 2021-07-04 MED ORDER — PREDNISONE 5 MG PO TABS: ORAL_TABLET | ORAL | 1 refills | Status: DC

## 2021-07-04 NOTE — Telephone Encounter (Signed)
I hope she reconsiders.  ? ?Malachi Bonds, MD ?[facility] ? ?

## 2021-07-04 NOTE — Progress Notes (Signed)
? ?    Endocrinology Consult Note ?                                           07/04/2021, 12:04 PM ? ? ?Subjective:  ? ? Patient ID: [name], female    DOB: [birth year], PCP Celene Squibb, MD ? ? ?Past Medical History:  ?Diagnosis Date  ? Anemia   ? Anxiety   ? Arthritis   ? Asthma   ? Cervical radiculopathy   ? Cluster headaches   ? COPD (chronic obstructive pulmonary disease) ([place])   ? COPD (chronic obstructive pulmonary disease) with chronic bronchitis ([place])   ? GERD (gastroesophageal reflux disease)   ? Hyperlipemia   ? Hypoglycemia   ? Hypothyroidism   ? Migraine   ? Pneumothorax   ? PONV (postoperative nausea and vomiting)   ? Pre-diabetes   ? Rectal discharge 07/28/2010  ? Shortness of breath   ? Sleep apnea   ? cannot tolerate-not using CPAP  ? Sluggishness 07/28/2010  ? ?Past Surgical History:  ?Procedure Laterality Date  ? ABDOMINAL HYSTERECTOMY    ? with bladder suspension  ? CERVICAL FUSION    ? CHOLECYSTECTOMY    ? COLONOSCOPY N/A 07/30/2013  ? Procedure: COLONOSCOPY;  Surgeon: Rogene Houston, MD;  [facility];  Service: Endoscopy;  Laterality: N/A;  930  ? EXAM UNDER ANESTHESIA WITH MANIPULATION OF SHOULDER Right 10/11/2016  ? Procedure: EXAM UNDER ANESTHESIA WITH MANIPULATION OF SHOULDER;  Surgeon: Carole Civil, MD;  Location: [facility];  Service: Orthopedics;  Laterality: Right;  ? HERNIA REPAIR    ? INCISIONAL HERNIA REPAIR N/A 07/30/2016  ? Procedure: HERNIA REPAIR INCISIONAL WITH MESH;  Surgeon: Aviva Signs, MD;  Location: [facility];  Service: General;  Laterality: N/A;  ? INTERSTIM IMPLANT PLACEMENT  02/02/2021  ? Procedure: [place] IMPLANT FIRST STAGE LEFT SACRUM ;  Surgeon: Cleon Gustin, MD;  Location: [facility];  Service: Urology;;  ? INTERSTIM IMPLANT PLACEMENT N/A 02/23/2021  ? Procedure: INTERSTIM IMPLANT SECOND STAGE;  Surgeon: Cleon Gustin, MD;  Location: [facility];  Service: Urology;  Laterality: N/A;  Rep coming, time needs to stay at 2:00  ? JOINT  REPLACEMENT    ? LOBECTOMY    ? right lung   ? POSTERIOR CERVICAL LAMINECTOMY Right 04/05/2017  ? Procedure: Right C6-7 Posterior cervical laminectomy;  Surgeon: Kary Kos, MD;  Location: [facility];  Service: Neurosurgery;  Laterality: Right;  Right C6-7 Posterior cervical laminectomy  ? SHOULDER ARTHROSCOPY WITH ROTATOR CUFF REPAIR Right 10/11/2016  ? Procedure: SHOULDER ARTHROSCOPY;  Surgeon: Carole Civil, MD;  Location: [facility];  Service: Orthopedics;  Laterality: Right;  ? TOTAL HIP ARTHROPLASTY Right 04/15/2020  ? Procedure: RIGHT TOTAL HIP ARTHROPLASTY ANTERIOR APPROACH;  Surgeon: Mcarthur Rossetti, MD;  Location: [facility];  Service: Orthopedics;  Laterality: Right;  ? TOTAL HIP ARTHROPLASTY Left 07/22/2020  ? Procedure: LEFT  HIP ARTHROPLASTY ANTERIOR APPROACH;  Surgeon: Mcarthur Rossetti, MD;  Location[facility];  Service: Orthopedics;  Laterality: Left;  RNFA  ? TUBAL LIGATION    ? ?Social History  ? ?Socioeconomic History  ? Marital status: Married  ?  Spouse name: Not on file  ? Number of children: Not on file  ? Years of education: Not on file  ? Highest education level: Not on file  ?Occupational  History  ? Not on file  ?Tobacco Use  ? Smoking status: Former  ?  Packs/day: 1.00  ?  Years: 30.00  ?  Pack years: 30.00  ?  Types: Cigarettes  ?  Quit date: 02/21/2008  ?  Years since quitting: 13.3  ? Smokeless tobacco: Never  ?Vaping Use  ? Vaping Use: Never used  ?Substance and Sexual Activity  ? Alcohol use: No  ?  Alcohol/week: 0.0 standard drinks  ?  Comment: no  ? Drug use: No  ? Sexual activity: Not Currently  ?  Partners: Male  ?  Birth control/protection: Surgical  ?Other Topics Concern  ? Not on file  ?Social History Narrative  ? Not on file  ? ?Social Determinants of Health  ? ?Financial Resource Strain: Not on file  ?Food Insecurity: Not on file  ?Transportation Needs: Not on file  ?Physical Activity: Not on file  ?Stress: Not on file  ?Social Connections: Not on file  ? ?Family  History  ?Problem Relation Age of Onset  ? Cancer Mother   ? COPD Mother   ? Diabetes Mother   ? Hyperlipidemia Mother   ? Hypertension Mother   ? Hyperlipidemia Father   ? Hypertension Father   ? Heart failure Maternal Grandmother   ? Hypertension Maternal Grandmother   ? Thyroid disease Maternal Grandmother   ? Diabetes Paternal Grandmother   ? Alzheimer's disease Paternal Grandmother   ? Heart failure Paternal Grandfather   ? Hypertension Paternal Grandfather   ? Bipolar disorder Son   ? Colon cancer Neg Hx   ? ?Outpatient Encounter Medications as of 07/04/2021  ?Medication Sig  ? acetaminophen (TYLENOL) 500 MG tablet Take 1,000 mg by mouth every 6 (six) hours as needed for moderate pain.  ? albuterol (VENTOLIN HFA) 108 (90 Base) MCG/ACT inhaler Inhale 2 puffs into the lungs every 4 (four) hours as needed for shortness of breath.  ? aspirin-acetaminophen-caffeine (EXCEDRIN MIGRAINE) 250-250-65 MG tablet Take 2 tablets by mouth every 8 (eight) hours as needed for migraine or headache.  ? Benralizumab (FASENRA) 30 MG/ML SOSY Inject 30 mg into the skin every 8 (eight) weeks.  ? budesonide (PULMICORT) 0.25 MG/2ML nebulizer solution mixed together with perforomist in nebulizer TWICE DAILY  ? calcium carbonate (TUMS EX) 750 MG chewable tablet Chew 2 tablets by mouth daily as needed for heartburn.  ? cetirizine (ZYRTEC) 10 MG tablet Take 10 mg by mouth daily.  ? Cholecalciferol (VITAMIN D3 SUPER STRENGTH) 50 MCG (2000 UT) CAPS Take 2,000 Units by mouth daily.  ? docusate sodium (COLACE) 100 MG capsule Take 100-200 mg by mouth daily as needed for mild constipation.  ? famotidine (PEPCID) 20 MG tablet Take 20 mg by mouth daily as needed for heartburn or indigestion.  ? formoterol (PERFOROMIST) 20 MCG/2ML nebulizer solution mixed together with pulmicort in nebulizer TWICE DAILY  ? Lancets (ONETOUCH DELICA PLUS [id number]) MISC Apply topically.  ? levothyroxine (SYNTHROID) 75 MCG tablet Take 0.5 tablets (37.5 mcg total) by  mouth daily before breakfast.  ? LORazepam (ATIVAN) 1 MG tablet Take 1.5 mg by mouth 2 (two) times daily.  ? montelukast (SINGULAIR) 10 MG tablet Take 1 tablet (10 mg total) by mouth at bedtime.  ? Multiple Vitamin (MULTIVITAMIN) tablet Take 1 tablet by mouth every evening.  ? pantoprazole (PROTONIX) 40 MG tablet Take 30- 60 min before your first and last meals of the day (Patient taking differently: Take 40 mg by mouth 2 (two) times daily. Take 30-  60 min before your first and last meals of the day)  ? pravastatin (PRAVACHOL) 40 MG tablet Take 40 mg by mouth at bedtime.  ? predniSONE (DELTASONE) 5 MG tablet Take 5 mg  and 7.5 mg on alternate days  ? pregabalin (LYRICA) 75 MG capsule Take 1 capsule by mouth twice daily  ? SUMAtriptan 6 MG/0.5ML SOAJ Inject 6 mg as directed daily as needed (migraine).   ? Triamcinolone Acetonide (NASACORT ALLERGY 24HR NA) Place 2 sprays into the nose daily as needed (allergies).  ? valACYclovir (VALTREX) 500 MG tablet Take 500-2,000 mg by mouth See admin instructions. Take 2000mg s at first sign of fever blister outbreak then take 500mg s daily until gone  ? [DISCONTINUED] predniSONE (DELTASONE) 10 MG tablet Take 1 tablet (10 mg total) by mouth daily.  ? ?Facility-Administered Encounter Medications as of 07/04/2021  ?Medication  ? Benralizumab SOSY 30 mg  ? ?ALLERGIES: ?Allergies  ?Allergen Reactions  ? Aspirin Shortness Of Breath  ?  Causes asthma flares   ? Penicillins Other (See Comments)  ?  UNSPECIFIED REACTION OF CHILDHOOD ? ?Has patient had a PCN reaction causing immediate rash, facial/tongue/throat swelling, SOB or lightheadedness with hypotension: NO ?Has patient had a PCN reaction causing severe rash involving mucus membranes or skin necrosis: NO ?Has patient had a PCN reaction that required hospitalization: no ?Has patient had a PCN reaction occurring within the last 10 years: NO ?If all of the above answers are "NO", then may proceed with Cephalosporin use. ?  ? Vicodin  [Hydrocodone-Acetaminophen] Other (See Comments)  ?  Headaches   ? ? ?VACCINATION STATUS: ?Immunization History  ?Administered Date(s) Administered  ? Influenza,inj,Quad PF,6+ Mos 04/13/2021  ? Influenza,inj,Quad

## 2021-07-09 ENCOUNTER — Other Ambulatory Visit: Payer: Self-pay | Admitting: Family

## 2021-07-10 NOTE — Telephone Encounter (Signed)
Ok to refill Singulair 10 mg once  a day quantity 30 with 5 refills.

## 2021-07-30 ENCOUNTER — Other Ambulatory Visit: Payer: Self-pay | Admitting: Physician Assistant

## 2021-07-31 ENCOUNTER — Telehealth: Payer: Self-pay | Admitting: Allergy & Immunology

## 2021-07-31 NOTE — Telephone Encounter (Signed)
[name] called in and states she cannot afford the Budesonide .025 through [facility].  [name] states she can afford the Budesonide through [facility] pharmacy but it has to be the Budesonide .050.  [name] wants to know if Dr. Dellis Anes would be ok changing the prescription to Budesonide .050 and if so, sending that to [facility] in [place].  Also, [name] mentions she was supposed to receive Albuterol for her nebulizer and she never received it and would like that called in to the [facility] in [place] as well.  Please advise.   ?

## 2021-08-01 ENCOUNTER — Telehealth: Payer: Self-pay | Admitting: Allergy & Immunology

## 2021-08-01 ENCOUNTER — Other Ambulatory Visit: Payer: Self-pay

## 2021-08-01 MED ORDER — ALBUTEROL SULFATE (2.5 MG/3ML) 0.083% IN NEBU: 2.5000 mg | INHALATION_SOLUTION | Freq: Four times a day (QID) | RESPIRATORY_TRACT | 1 refills | Status: DC | PRN

## 2021-08-01 MED ORDER — BUDESONIDE 0.5 MG/2ML IN SUSP
0.5000 mg | Freq: Two times a day (BID) | RESPIRATORY_TRACT | 2 refills | Status: DC | PRN
Start: 2021-08-01 — End: 2021-10-25

## 2021-08-01 NOTE — Telephone Encounter (Signed)
Patient called and needs to have the nebulizer solution called into [facility]. [phone]. ?

## 2021-08-01 NOTE — Telephone Encounter (Signed)
Sent in Budesonide 0.5mg  into the [facility] pharmacy in [place], also informed patient. ? [name] [phone] ?

## 2021-08-01 NOTE — Telephone Encounter (Signed)
We can change to 0.5 mg Pulmicort, no problem. Can someone send that and albuterol nebulizer solution to the requested pharmacy? Thanks!  ? ?Salvatore Marvel, MD ?[facility] [place] [facility] ? ? ?

## 2021-08-01 NOTE — Telephone Encounter (Signed)
This have been addressed in another telephone encounter. 

## 2021-08-01 NOTE — Telephone Encounter (Signed)
Patient has been notified that medication will be ready for pick up at the [facility].  ?

## 2021-08-09 ENCOUNTER — Ambulatory Visit (INDEPENDENT_AMBULATORY_CARE_PROVIDER_SITE_OTHER): Payer: Medicare Other

## 2021-08-09 DIAGNOSIS — J4550 Severe persistent asthma, uncomplicated: Secondary | ICD-10-CM

## 2021-08-14 ENCOUNTER — Other Ambulatory Visit: Payer: Self-pay

## 2021-08-14 DIAGNOSIS — Z122 Encounter for screening for malignant neoplasm of respiratory organs: Secondary | ICD-10-CM

## 2021-08-14 DIAGNOSIS — Z87891 Personal history of nicotine dependence: Secondary | ICD-10-CM

## 2021-08-20 ENCOUNTER — Other Ambulatory Visit: Payer: Self-pay | Admitting: Allergy & Immunology

## 2021-08-21 NOTE — Telephone Encounter (Signed)
err

## 2021-08-23 ENCOUNTER — Encounter: Payer: Self-pay | Admitting: Acute Care

## 2021-08-23 ENCOUNTER — Ambulatory Visit (INDEPENDENT_AMBULATORY_CARE_PROVIDER_SITE_OTHER): Payer: Medicare Other | Admitting: Acute Care

## 2021-08-23 DIAGNOSIS — Z87891 Personal history of nicotine dependence: Secondary | ICD-10-CM | POA: Diagnosis not present

## 2021-08-23 NOTE — Progress Notes (Signed)
Virtual Visit via Telephone Note ? ?I connected with Loreta Ave on 08/23/21 at  9:30 AM EDT by telephone and verified that I am speaking with the correct person using two identifiers. ? ?Location: ?Patient:  At home ?Provider: [address], [place], [place], [address]  ?  ?I discussed the limitations, risks, security and privacy concerns of performing an evaluation and management service by telephone and the availability of in person appointments. I also discussed with the patient that there may be a patient responsible charge related to this service. The patient expressed understanding and agreed to proceed. ? ? ?Shared Decision Making Visit Lung Cancer Screening Program ?([id number]) ? ? ?Eligibility: ?Age 56 y.o. ?Pack Years Smoking History Calculation 51 pack year smoking history ?(# packs/per year x # years smoked) ?Recent History of coughing up blood  no ?Unexplained weight loss? no ?( >Than 15 pounds within the last 6 months ) ?Prior History Lung / other cancer no ?(Diagnosis within the last 5 years already requiring surveillance chest CT Scans). ?Smoking Status Former Smoker ?Former Smokers: Years since quit: 13 years ? Quit Date: 02/21/2008 ? ?Visit Components: ?Discussion included one or more decision making aids. yes ?Discussion included risk/benefits of screening. yes ?Discussion included potential follow up diagnostic testing for abnormal scans. yes ?Discussion included meaning and risk of over diagnosis. yes ?Discussion included meaning and risk of False Positives. yes ?Discussion included meaning of total radiation exposure. yes ? ?Counseling Included: ?Importance of adherence to annual lung cancer LDCT screening. yes ?Impact of comorbidities on ability to participate in the program. yes ?Ability and willingness to under diagnostic treatment. yes ? ?Smoking Cessation Counseling: ?Current Smokers:  ?Discussed importance of smoking cessation. yes ?Information about tobacco cessation classes  and interventions provided to patient. yes ?Patient provided with "ticket" for LDCT Scan. yes ?Symptomatic Patient. no ? Counseling NA ?Diagnosis Code: Tobacco Use Z72.0 ?Asymptomatic Patient yes ? Counseling (Intermediate counseling: > three minutes counseling) G2563 ?Former Smokers:  ?Discussed the importance of maintaining cigarette abstinence. yes ?Diagnosis Code: Personal History of Nicotine Dependence. S93.734 ?Information about tobacco cessation classes and interventions provided to patient. Yes ?Patient provided with "ticket" for LDCT Scan. yes ?Written Order for Lung Cancer Screening with LDCT placed in Epic. Yes ?(CT Chest Lung Cancer Screening Low Dose W/O CM) [id number] ?Z12.2-Screening of respiratory organs ?Z87.891-Personal history of nicotine dependence ? ?I spent 25 minutes of face to face time/virtual visit time  with  Ms. Criss discussing the risks and benefits of lung cancer screening. We took the time to pause the power point at intervals to allow for questions to be asked and answered to ensure understanding. We discussed that she had taken the single most powerful action possible to decrease her risk of developing lung cancer when she quit smoking. I counseled her to remain smoke free, and to contact me if she ever had the desire to smoke again so that I can provide resources and tools to help support the effort to remain smoke free. We discussed the time and location of the scan, and that either  Abigail Miyamoto RN, Karlton Lemon, RN or I  or I will call / send a letter with the results within  24-72 hours of receiving them. She has the office contact information in the event she needs to speak with me,  she verbalized understanding of all of the above and had no further questions upon leaving the office.  ? ? ? ?I explained to the patient that there has  been a high incidence of coronary artery disease noted on these exams. I explained that this is a non-gated exam therefore degree or severity  cannot be determined. This patient is on statin therapy. I have asked the patient to follow-up with their PCP regarding any incidental finding of coronary artery disease and management with diet or medication as they feel is clinically indicated. The patient verbalized understanding of the above and had no further questions. ?  ? ? ?Bevelyn Ngo, NP ?08/23/2021 ? ? ? ? ? ? ?

## 2021-08-23 NOTE — Patient Instructions (Signed)
Thank you for participating in the [facility] Scre[facility]. It was our pleasure to meet you today. We will call you with the results of your scan within the next few days. Your scan will be assigned a Lung RADS category score by the physicians reading the scans.  This Lung RADS score determines follow up scanning.  See below for description of categories, and follow up screening recommendations. We will be in touch to schedule your follow up screening annually or based on recommendations of our providers. We will fax a copy of your scan results to your Primary Care Physician, or the physician who referred you to the program, to ensure they have the results. Please call the office if you have any questions or concerns regarding your scanning experience or results.  Our office number is [phone]. Please speak with Denise Phelps, RN. , or  Denise Buckner RN, They are  our Lung Cancer Screening RN.'s If They are unavailable when you call, Please leave a message on the voice mail. We will return your call at our earliest convenience.This voice mail is monitored several times a day.  Remember, if your scan is normal, we will scan you annually as long as you continue to meet the criteria for the program. (Age 55-77, Current smoker or smoker who has quit within the last 15 years). If you are a smoker, remember, quitting is the single most powerful action that you can take to decrease your risk of lung cancer and other pulmonary, breathing related problems. We know quitting is hard, and we are here to help.  Please let us know if there is anything we can do to help you meet your goal of quitting. If you are a former smoker, congratulations. We are proud of you! Remain smoke free! Remember you can refer friends or family members through the number above.  We will screen them to make sure they meet criteria for the program. Thank you for helping us take better care of you by  participating in Lung Screening.  You can receive free nicotine replacement therapy ( patches, gum or mints) by calling 1-800-QUIT NOW. Please call so we can get you on the path to becoming  a non-smoker. I know it is hard, but you can do this!  Lung RADS Categories:  Lung RADS 1: no nodules or definitely non-concerning nodules.  Recommendation is for a repeat annual scan in 12 months.  Lung RADS 2:  nodules that are non-concerning in appearance and behavior with a very low likelihood of becoming an active cancer. Recommendation is for a repeat annual scan in 12 months.  Lung RADS 3: nodules that are probably non-concerning , includes nodules with a low likelihood of becoming an active cancer.  Recommendation is for a 6-month repeat screening scan. Often noted after an upper respiratory illness. We will be in touch to make sure you have no questions, and to schedule your 6-month scan.  Lung RADS 4 A: nodules with concerning findings, recommendation is most often for a follow up scan in 3 months or additional testing based on our provider's assessment of the scan. We will be in touch to make sure you have no questions and to schedule the recommended 3 month follow up scan.  Lung RADS 4 B:  indicates findings that are concerning. We will be in touch with you to schedule additional diagnostic testing based on our provider's  assessment of the scan.  Other options for assistance in smoking cessation (   As covered by your insurance benefits)  Hypnosis for smoking cessation  [employer] [phone]  Acupuncture for smoking cessation  [facility] [phone]   

## 2021-08-27 ENCOUNTER — Other Ambulatory Visit: Payer: Self-pay | Admitting: Physician Assistant

## 2021-08-30 ENCOUNTER — Ambulatory Visit (HOSPITAL_COMMUNITY)
Admission: RE | Admit: 2021-08-30 | Discharge: 2021-08-30 | Disposition: A | Discharge status: Home / Self Care | Payer: Medicare Other | Source: Ambulatory Visit | Attending: Acute Care | Admitting: Acute Care

## 2021-08-30 ENCOUNTER — Other Ambulatory Visit (HOSPITAL_COMMUNITY): Payer: Self-pay | Admitting: Internal Medicine

## 2021-08-30 DIAGNOSIS — Z122 Encounter for screening for malignant neoplasm of respiratory organs: Secondary | ICD-10-CM | POA: Diagnosis present

## 2021-08-30 DIAGNOSIS — Z1231 Encounter for screening mammogram for malignant neoplasm of breast: Secondary | ICD-10-CM

## 2021-08-30 DIAGNOSIS — I700 Atherosclerosis of aorta: Secondary | ICD-10-CM | POA: Insufficient documentation

## 2021-08-30 DIAGNOSIS — J439 Emphysema, unspecified: Secondary | ICD-10-CM | POA: Insufficient documentation

## 2021-08-30 DIAGNOSIS — Z87891 Personal history of nicotine dependence: Secondary | ICD-10-CM | POA: Diagnosis present

## 2021-09-05 ENCOUNTER — Other Ambulatory Visit: Payer: Self-pay

## 2021-09-05 DIAGNOSIS — Z87891 Personal history of nicotine dependence: Secondary | ICD-10-CM

## 2021-09-05 DIAGNOSIS — Z122 Encounter for screening for malignant neoplasm of respiratory organs: Secondary | ICD-10-CM

## 2021-09-24 ENCOUNTER — Other Ambulatory Visit: Payer: Self-pay | Admitting: Physician Assistant

## 2021-09-26 LAB — COMPREHENSIVE METABOLIC PANEL
ALT: 19 IU/L (ref 0–32)
AST: 15 IU/L (ref 0–40)
Albumin/Globulin Ratio: 2 (ref 1.2–2.2)
Albumin: 4.3 g/dL (ref 3.8–4.9)
Alkaline Phosphatase: 76 IU/L (ref 44–121)
BUN/Creatinine Ratio: 14 (ref 9–23)
BUN: 13 mg/dL (ref 6–24)
Bilirubin Total: 0.2 mg/dL (ref 0.0–1.2)
CO2: 25 mmol/L (ref 20–29)
Calcium: 9.3 mg/dL (ref 8.7–10.2)
Chloride: 107 mmol/L — ABNORMAL HIGH (ref 96–106)
Creatinine, Ser: 0.94 mg/dL (ref 0.57–1.00)
Globulin, Total: 2.2 g/dL (ref 1.5–4.5)
Glucose: 80 mg/dL (ref 70–99)
Potassium: 4.1 mmol/L (ref 3.5–5.2)
Sodium: 146 mmol/L — ABNORMAL HIGH (ref 134–144)
Total Protein: 6.5 g/dL (ref 6.0–8.5)
eGFR: 71 mL/min/{1.73_m2} (ref >59)

## 2021-09-26 LAB — LIPID PANEL
Chol/HDL Ratio: 3.1 ratio (ref 0.0–4.4)
Cholesterol, Total: 201 mg/dL — ABNORMAL HIGH (ref 100–199)
HDL: 65 mg/dL (ref >39)
LDL Chol Calc (NIH): 115 mg/dL — ABNORMAL HIGH (ref 0–99)
Triglycerides: 122 mg/dL (ref 0–149)
VLDL Cholesterol Cal: 21 mg/dL (ref 5–40)

## 2021-09-26 LAB — T4, FREE: Free T4: 1.03 ng/dL (ref 0.82–1.77)

## 2021-09-26 LAB — TSH: TSH: 7.41 u[IU]/mL — ABNORMAL HIGH (ref 0.450–4.500)

## 2021-09-26 LAB — VITAMIN D 25 HYDROXY (VIT D DEFICIENCY, FRACTURES): Vit D, 25-Hydroxy: 44.1 ng/mL (ref 30.0–100.0)

## 2021-10-04 ENCOUNTER — Ambulatory Visit (INDEPENDENT_AMBULATORY_CARE_PROVIDER_SITE_OTHER): Payer: Medicare Other

## 2021-10-04 DIAGNOSIS — J4550 Severe persistent asthma, uncomplicated: Secondary | ICD-10-CM | POA: Diagnosis not present

## 2021-10-05 ENCOUNTER — Ambulatory Visit: Payer: Medicare Other | Admitting: "Endocrinology

## 2021-10-05 ENCOUNTER — Encounter: Payer: Self-pay | Admitting: "Endocrinology

## 2021-10-05 VITALS — BP 118/68 | HR 76 | Ht 64.0 in | Wt 154.2 lb

## 2021-10-05 DIAGNOSIS — E039 Hypothyroidism, unspecified: Secondary | ICD-10-CM | POA: Diagnosis not present

## 2021-10-05 DIAGNOSIS — E2740 Unspecified adrenocortical insufficiency: Secondary | ICD-10-CM

## 2021-10-05 DIAGNOSIS — E782 Mixed hyperlipidemia: Secondary | ICD-10-CM

## 2021-10-05 DIAGNOSIS — E273 Drug-induced adrenocortical insufficiency: Secondary | ICD-10-CM

## 2021-10-05 DIAGNOSIS — R7303 Prediabetes: Secondary | ICD-10-CM | POA: Diagnosis not present

## 2021-10-05 DIAGNOSIS — T380X5D Adverse effect of glucocorticoids and synthetic analogues, subsequent encounter: Secondary | ICD-10-CM

## 2021-10-05 LAB — POCT GLYCOSYLATED HEMOGLOBIN (HGB A1C): HbA1c, POC (controlled diabetic range): 5.8 % (ref 0.0–7.0)

## 2021-10-05 MED ORDER — LEVOTHYROXINE SODIUM 88 MCG PO TABS
88.0000 ug | ORAL_TABLET | Freq: Every day | ORAL | 1 refills | Status: DC
Start: 2021-10-05 — End: 2022-04-11

## 2021-10-05 NOTE — Progress Notes (Signed)
10/05/2021, 9:32 AM   Endocrinology follow-up note  Subjective:    Patient ID: [name], female    DOB: Dec 28, 1965, PCP Celene Squibb, MD   Past Medical History:  Diagnosis Date   Anemia    Anxiety    Arthritis    Asthma    Cervical radiculopathy    Cluster headaches    COPD (chronic obstructive pulmonary disease) ([place])    COPD (chronic obstructive pulmonary disease) with chronic bronchitis (HCC)    GERD (gastroesophageal reflux disease)    Hyperlipemia    Hypoglycemia    Hypothyroidism    Migraine    Pneumothorax    PONV (postoperative nausea and vomiting)    Pre-diabetes    Rectal discharge 07/28/2010   Shortness of breath    Sleep apnea    cannot tolerate-not using CPAP   Sluggishness 07/28/2010   Past Surgical History:  Procedure Laterality Date   ABDOMINAL HYSTERECTOMY     with bladder suspension   CERVICAL FUSION     CHOLECYSTECTOMY     COLONOSCOPY N/A 07/30/2013   Procedure: COLONOSCOPY;  Surgeon: Rogene Houston, MD;  [facility];  Service: Endoscopy;  Laterality: N/A;  [place] OF SHOULDER Right 10/11/2016   Procedure: EXAM UNDER ANESTHESIA WITH MANIPULATION OF SHOULDER;  Surgeon: Carole Civil, MD;  Location: [facility];  Service: Orthopedics;  Laterality: Right;   HERNIA REPAIR     INCISIONAL HERNIA REPAIR N/A 07/30/2016   Procedure: HERNIA REPAIR INCISIONAL WITH MESH;  Surgeon: Aviva Signs, MD;  Location: [facility];  Service: General;  Laterality: N/A;   INTERSTIM IMPLANT PLACEMENT  02/02/2021   Procedure: [place] IMPLANT FIRST STAGE LEFT SACRUM ;  Surgeon: Cleon Gustin, MD;  Location: [facility];  Service: Urology;;   [place] IMPLANT PLACEMENT N/A 02/23/2021   Procedure: [place] IMPLANT SECOND STAGE;  Surgeon: Cleon Gustin, MD;  Location: [facility];  Service: Urology;  Laterality: N/A;  Rep coming, time needs to stay at 2:00    [place]     right lung    POSTERIOR CERVICAL LAMINECTOMY Right 04/05/2017   Procedure: Right C6-7 Posterior cervical laminectomy;  Surgeon: Kary Kos, MD;  Location: [facility];  Service: Neurosurgery;  Laterality: Right;  Right C6-7 Posterior cervical laminectomy   SHOULDER ARTHROSCOPY WITH ROTATOR CUFF REPAIR Right 10/11/2016   Procedure: SHOULDER ARTHROSCOPY;  Surgeon: Carole Civil, MD;  Location: [facility];  Service: Orthopedics;  Laterality: Right;   TOTAL HIP ARTHROPLASTY Right 04/15/2020   Procedure: RIGHT TOTAL HIP ARTHROPLASTY ANTERIOR APPROACH;  Surgeon: Mcarthur Rossetti, MD;  Location: [facility];  Service: Orthopedics;  Laterality: Right;   TOTAL HIP ARTHROPLASTY Left 07/22/2020   Procedure: LEFT  HIP ARTHROPLASTY ANTERIOR APPROACH;  Surgeon: Mcarthur Rossetti, MD;  Location: [facility];  Service: Orthopedics;  Laterality: Left;  RNFA   TUBAL LIGATION     Social History   Socioeconomic History   Marital status: Married    Spouse name: Not on file   Number of children: Not on file   Years of education: Not on file   Highest education level: Not on file  Occupational History   Not on file  Tobacco Use   Smoking status: Former    Packs/day: 1.00    Years: 30.00    Total pack years: 30.00    Types: Cigarettes    Quit date: 02/21/2008    Years since quitting: 13.6   Smokeless tobacco: Never  Vaping Use   Vaping Use: Never used  Substance and Sexual Activity   Alcohol use: No    Alcohol/week: 0.0 standard drinks of alcohol    Comment: no   Drug use: No   Sexual activity: Not Currently    Partners: Male    Birth control/protection: Surgical  Other Topics Concern   Not on file  Social History Narrative   Not on file   Social Determinants of Health   Financial Resource Strain: Not on file  Food Insecurity: Not on file  Transportation Needs: Not on file  Physical Activity: Not on file  Stress: Not on file  Social Connections: Not on  file   Family History  Problem Relation Age of Onset   Cancer Mother    COPD Mother    Diabetes Mother    Hyperlipidemia Mother    Hypertension Mother    Hyperlipidemia Father    Hypertension Father    Heart failure Maternal Grandmother    Hypertension Maternal Grandmother    Thyroid disease Maternal Grandmother    Diabetes Paternal Grandmother    Alzheimer's disease Paternal Grandmother    Heart failure Paternal Grandfather    Hypertension Paternal Grandfather    Bipolar disorder Son    Colon cancer Neg Hx    Outpatient Encounter Medications as of 10/05/2021  Medication Sig   acetaminophen (TYLENOL) 500 MG tablet Take 1,000 mg by mouth every 6 (six) hours as needed for moderate pain.   albuterol (PROVENTIL) (2.5 MG/3ML) 0.083% nebulizer solution Take 3 mLs (2.5 mg total) by nebulization every 6 (six) hours as needed for wheezing or shortness of breath.   albuterol (VENTOLIN HFA) 108 (90 Base) MCG/ACT inhaler Inhale 2 puffs into the lungs every 4 (four) hours as needed for shortness of breath.   aspirin-acetaminophen-caffeine (EXCEDRIN MIGRAINE) 250-250-65 MG tablet Take 2 tablets by mouth every 8 (eight) hours as needed for migraine or headache.   Benralizumab (FASENRA) 30 MG/ML SOSY Inject 30 mg into the skin every 8 (eight) weeks.   budesonide (PULMICORT) 0.5 MG/2ML nebulizer solution Take 2 mLs (0.5 mg total) by nebulization 2 (two) times daily as needed.   calcium carbonate (TUMS EX) 750 MG chewable tablet Chew 2 tablets by mouth daily as needed for heartburn.   cetirizine (ZYRTEC) 10 MG tablet Take 10 mg by mouth daily.   Cholecalciferol (VITAMIN D3 SUPER STRENGTH) 50 MCG (2000 UT) CAPS Take 2,000 Units by mouth daily.   docusate sodium (COLACE) 100 MG capsule Take 100-200 mg by mouth daily as needed for mild constipation.   escitalopram (LEXAPRO) 10 MG tablet Take 10 mg by mouth daily.   famotidine (PEPCID) 20 MG tablet Take 20 mg by mouth daily as needed for heartburn or  indigestion.   formoterol (PERFOROMIST) 20 MCG/2ML nebulizer solution MIX WITH PULMICORT AND USE IN NEBULIZER TWICE DAILY   Lancets (ONETOUCH DELICA PLUS LANCET33G) MISC Apply topically.   levothyroxine (SYNTHROID) 88 MCG tablet Take 1 tablet (88 mcg total) by mouth daily before breakfast.   LORazepam (ATIVAN) 1 MG tablet Take 1.5 mg by mouth 2 (two) times daily.   montelukast (SINGULAIR) 10 MG tablet TAKE 1 TABLET BY  MOUTH AT BEDTIME   Multiple Vitamin (MULTIVITAMIN) tablet Take 1 tablet by mouth every evening.   pantoprazole (PROTONIX) 40 MG tablet Take 30- 60 min before your first and last meals of the day (Patient taking differently: Take 40 mg by mouth 2 (two) times daily. Take 30- 60 min before your first and last meals of the day)   pravastatin (PRAVACHOL) 40 MG tablet Take 40 mg by mouth at bedtime.   predniSONE (DELTASONE) 5 MG tablet Take 5 mg  and 7.5 mg on alternate days   pregabalin (LYRICA) 75 MG capsule Take 1 capsule by mouth twice daily   SUMAtriptan 6 MG/0.5ML SOAJ Inject 6 mg as directed daily as needed (migraine).    Triamcinolone Acetonide (NASACORT ALLERGY 24HR NA) Place 2 sprays into the nose daily as needed (allergies).   valACYclovir (VALTREX) 500 MG tablet Take 500-2,000 mg by mouth See admin instructions. Take 2000mg s at first sign of fever blister outbreak then take 500mg s daily until gone   [DISCONTINUED] levothyroxine (SYNTHROID) 75 MCG tablet Take 0.5 tablets (37.5 mcg total) by mouth daily before breakfast.   Facility-Administered Encounter Medications as of 10/05/2021  Medication   Benralizumab SOSY 30 mg   ALLERGIES: Allergies  Allergen Reactions   Aspirin Shortness Of Breath    Causes asthma flares    Penicillins Other (See Comments)    UNSPECIFIED REACTION OF CHILDHOOD  Has patient had a PCN reaction causing immediate rash, facial/tongue/throat swelling, SOB or lightheadedness with hypotension: NO Has patient had a PCN reaction causing severe rash  involving mucus membranes or skin necrosis: NO Has patient had a PCN reaction that required hospitalization: no Has patient had a PCN reaction occurring within the last 10 years: NO If all of the above answers are "NO", then may proceed with Cephalosporin use.    Vicodin [Hydrocodone-Acetaminophen] Other (See Comments)    Headaches     VACCINATION STATUS: Immunization History  Administered Date(s) Administered   Influenza,inj,Quad PF,6+ Mos 04/13/2021   Influenza,inj,Quad PF,6-35 Mos 01/28/2020   Influenza-Unspecified 02/15/2017, 04/13/2019, 04/13/2021   PFIZER(Purple Top)SARS-COV-2 Vaccination 06/21/2019, 07/22/2019, 03/09/2020   PNEUMOCOCCAL CONJUGATE-20 04/13/2021    HPI [name] is 56 y.o. female who presents today with a medical history as above. she is being seen in follow-up after she was seen in consultation for steroid induced adrenal suppression requested by Celene Squibb, MD.   See notes from her previous visit.  She has been treated with prednisone more or less continuously for the last 15 years.  There are times she was taking 20 mg of prednisone daily, mostly after 10mg  daily.  After diagnosis with adrenal insufficiency, she was advised to take alternating doses of prednisone 7.5 mg p.o. every other day and 5 mg p.o. every other day.   She denies any history of adrenal crisis.  She has history of heavy smoking, complicated by COPD on ongoing inhalers including albuterol, budesonide, and Perforomist.  She is also on oxygen supplement 24/7. She denies any history of adrenal injury, penetrating abdominal trauma.  Her other medical problems include hypothyroidism on levothyroxine 75 mcg p.o. daily, hyperlipidemia, prediabetes.  Her previsit thyroid function tests are consistent with slight under replacement.  Her most recent bone density from March 25, 2019 was consistent with osteopenia of the spine, normal bone density on the left forearm radius.    Review of  Systems  Constitutional: + Gained 7 pounds since last visit,   + fatigue, no subjective hyperthermia, no subjective hypothermia Eyes:  no blurry vision, no xerophthalmia ENT: no sore throat, no nodules palpated in throat, no dysphagia/odynophagia, no hoarseness Cardiovascular: no Chest Pain, + Shortness of Breath on continuous oxygen supplement, no palpitations, no leg swelling Respiratory: + On continuous oxygen supplement.   Status post partial pneumonectomy in 2004. Gastrointestinal: no Nausea/Vomiting/Diarhhea Musculoskeletal: no muscle/joint aches Skin: no rashes Neurological: no tremors, no numbness, no tingling, no dizziness Psychiatric: no depression, no anxiety  Objective:       10/05/2021    8:48 AM 07/04/2021    8:26 AM 06/29/2021    9:25 AM  Vitals with BMI  Height 5\' 4"  5\' 4"  5\' 4"   Weight 154 lbs 3 oz 147 lbs 3 oz 148 lbs 13 oz  BMI 26.46 25.25 25.53  Systolic 118 90 122  Diastolic 68 66 66  Pulse 76 76 82    BP 118/68   Pulse 76   Ht 5\' 4"  (1.626 m)   Wt 154 lb 3.2 oz (69.9 kg)   BMI 26.47 kg/m   Wt Readings from Last 3 Encounters:  10/05/21 154 lb 3.2 oz (69.9 kg)  07/04/21 147 lb 3.2 oz (66.8 kg)  06/29/21 148 lb 12.8 oz (67.5 kg)    Physical Exam  Constitutional:  Body mass index is 26.47 kg/m.,  not in acute distress, normal state of mind Eyes: PERRLA, EOMI, no exophthalmos ENT: moist mucous membranes, no gross thyromegaly, no gross cervical lymphadenopathy   CMP ( most recent) CMP     Component Value Date/Time   NA 146 (H) 09/25/2021 0754   K 4.1 09/25/2021 0754   CL 107 (H) 09/25/2021 0754   CO2 25 09/25/2021 0754   GLUCOSE 80 09/25/2021 0754   GLUCOSE 149 (H) 07/23/2020 0305   BUN 13 09/25/2021 0754   CREATININE 0.94 09/25/2021 0754   CALCIUM 9.3 09/25/2021 0754   PROT 6.5 09/25/2021 0754   ALBUMIN 4.3 09/25/2021 0754   AST 15 09/25/2021 0754   ALT 19 09/25/2021 0754   ALKPHOS 76 09/25/2021 0754   BILITOT 0.2 09/25/2021 0754    GFRNONAA >60 07/23/2020 0305   GFRAA >60 03/06/2019 0931     Diabetic Labs (most recent): Lab Results  Component Value Date   HGBA1C 5.8 10/05/2021   HGBA1C 6.0 (H) 07/13/2020   HGBA1C 5.7 (H) 04/11/2020     Lab Results  Component Value Date   TSH 7.410 (H) 09/25/2021   TSH 3.21 05/21/2006   FREET4 1.03 09/25/2021     Lipid Panel     Component Value Date/Time   CHOL 201 (H) 09/25/2021 0754   TRIG 122 09/25/2021 0754   HDL 65 09/25/2021 0754   CHOLHDL 3.1 09/25/2021 0754   LDLCALC 115 (H) 09/25/2021 0754   LABVLDL 21 09/25/2021 0754    Assessment & Plan:   1.  Steroid induced adrenal suppression   2.  Hypothyroidism   3.  Prediabetes   - I have reviewed her recent and available endocrine records and clinically evaluated the patient. - Based on these reviews, she has adrenal suppression due to chronic steroid treatment related to her COPD/asthma.  This patient will likely need lifelong replacement dose steroids, glucocorticoids for now.  She is advised to continue  alternating dose of 7.5 mg and 5 mg of prednisone daily until her next visit.  The minimum amount of prednisone she should be on is 5 mg p.o. daily.  Her blood pressure is stable at 118/68 today, if she develops hypotension, she will be considered  for [place] on subsequent visits.    It is unsafe, unnecessary to withdraw prednisone to test her for adrenal insufficiency.  I discussed and recommended medical alert for her to wear as a wrist lace or necklace depicting her adrenal insufficiency and the need for steroids.   She has point-of-care A1c of 5.8% today, consistent with prediabetes.  She will not need medication intervention for this at this time.     Regarding her hypothyroidism, the circumstance of diagnosis not available to review.  Her previsit labs show inadequate replacement.  I discussed and increase her levothyroxine to 88 mcg p.o. daily before breakfast.    - We discussed about the correct  intake of her thyroid hormone, on empty stomach at fasting, with water, separated by at least 30 minutes from breakfast and other medications,  and separated by more than 4 hours from calcium, iron, multivitamins, acid reflux medications (PPIs). -Patient is made aware of the fact that thyroid hormone replacement is needed for life, dose to be adjusted by periodic monitoring of thyroid function tests.  Regarding her dyslipidemia: ALT is uncontrolled with LDL 115.  She will continue to benefit from statin intervention.  I advised her to continue Pravachol 40 mg p.o. nightly.  Side effects and precautions discussed with her.     - she is advised to maintain close follow up with Celene Squibb, MD for primary care needs.    I spent 31 minutes in the care of the patient today including review of labs from [place], Lipids, Thyroid Function, Hematology (current and previous including abstractions from other facilities); face-to-face time discussing  her blood glucose readings/logs, discussing hypoglycemia and hyperglycemia episodes and symptoms, medications doses, her options of short and long term treatment based on the latest standards of care / guidelines;  discussion about incorporating lifestyle medicine;  and documenting the encounter. Risk reduction counseling performed per USPSTF guidelines to reduce obesity and cardiovascular risk factors.     Please refer to Patient Instructions for Blood Glucose Monitoring and Insulin/Medications Dosing Guide"  in media tab for additional information. Please  also refer to " Patient Self Inventory" in the Media  tab for reviewed elements of pertinent patient history.  [name] participated in the discussions, expressed understanding, and voiced agreement with the above plans.  All questions were answered to her satisfaction. she is encouraged to contact clinic should she have any questions or concerns prior to her return visit.   Follow up plan: Return in  about 6 months (around 04/06/2022) for F/U with Pre-visit Labs, A1c -NV.   Glade Lloyd, MD [facility] [address] [place], [place] [address] Phone: [phone]  Fax: [phone]     10/05/2021, 9:32 AM  This note was partially dictated with voice recognition software. Similar sounding words can be transcribed inadequately or may not  be corrected upon review.

## 2021-10-05 NOTE — Patient Instructions (Signed)

## 2021-10-16 ENCOUNTER — Ambulatory Visit (HOSPITAL_COMMUNITY)
Admission: RE | Admit: 2021-10-16 | Discharge: 2021-10-16 | Disposition: A | Discharge status: Home / Self Care | Payer: Medicare Other | Source: Ambulatory Visit | Attending: Internal Medicine | Admitting: Internal Medicine

## 2021-10-16 DIAGNOSIS — Z1231 Encounter for screening mammogram for malignant neoplasm of breast: Secondary | ICD-10-CM | POA: Insufficient documentation

## 2021-10-23 DIAGNOSIS — F331 Major depressive disorder, recurrent, moderate: Secondary | ICD-10-CM | POA: Insufficient documentation

## 2021-10-23 DIAGNOSIS — K219 Gastro-esophageal reflux disease without esophagitis: Secondary | ICD-10-CM | POA: Insufficient documentation

## 2021-10-23 DIAGNOSIS — M8580 Other specified disorders of bone density and structure, unspecified site: Secondary | ICD-10-CM | POA: Insufficient documentation

## 2021-10-25 ENCOUNTER — Encounter: Payer: Self-pay | Admitting: Allergy & Immunology

## 2021-10-25 ENCOUNTER — Ambulatory Visit: Payer: Medicare Other | Admitting: Allergy & Immunology

## 2021-10-25 VITALS — BP 102/64 | HR 82 | Temp 98.0°F | Resp 19 | Ht 65.0 in | Wt 153.0 lb

## 2021-10-25 DIAGNOSIS — D849 Immunodeficiency, unspecified: Secondary | ICD-10-CM | POA: Diagnosis not present

## 2021-10-25 DIAGNOSIS — Z9981 Dependence on supplemental oxygen: Secondary | ICD-10-CM

## 2021-10-25 DIAGNOSIS — J3089 Other allergic rhinitis: Secondary | ICD-10-CM

## 2021-10-25 DIAGNOSIS — J302 Other seasonal allergic rhinitis: Secondary | ICD-10-CM

## 2021-10-25 DIAGNOSIS — F1920 Other psychoactive substance dependence, uncomplicated: Secondary | ICD-10-CM

## 2021-10-25 DIAGNOSIS — J449 Chronic obstructive pulmonary disease, unspecified: Secondary | ICD-10-CM

## 2021-10-25 MED ORDER — BUDESONIDE 0.5 MG/2ML IN SUSP: 0.5000 mg | Freq: Two times a day (BID) | RESPIRATORY_TRACT | 2 refills | Status: DC | PRN

## 2021-10-25 NOTE — Patient Instructions (Addendum)
1. Asthma-COPD overlap syndrome - Lung testing looks stable today.  - I am glad that Dr. Fransico Him is making sure we are weaning your prednisone in a medically appropriate way.  - We are giving you some samples of Breztri to try (two puffs twice daily). - [place] and Me Form provided to get coverage of this.  - When using Breztri, do NOT use your Pulmicort and Perforomist!  - Daily controller medication(s): Pulmicort (budesonide) + Performist (formoterol) mixed together in nebulizer TWICE DAILY + prednisone 10mg  daily + Singulair 10mg  daily + Fasenra every 8 weeks - Rescue medications: albuterol 4 puffs every 4-6 hours as needed or albuterol nebulizer one vial every 4-6 hours as needed - Asthma control goals:  * Full participation in all desired activities (may need albuterol before activity) * Albuterol use two time or less a week on average (not counting use with activity) * Cough interfering with sleep two time or less a month * Oral steroids no more than once a year * No hospitalizations   2. Chronic rhinitis (horse, tobacco leaf, grasses, ragweed, trees, indoor molds, outdoor molds, dust mites, cat and dog) - Continue with: Zyrtec (cetirizine) 10mg  tablet once daily and Singulair (montelukast) 10mg  daily and Nasacort (triamcinolone) two sprays per nostril daily AS NEEDED - Continue with nasal saline rinses.  3. Follow up in three months or earlier if needed.   Please inform of any Emergency Department visits, hospitalizations, or changes in symptoms. Call before going to the ED for breathing or allergy symptoms since we might be able to fit you in for a sick visit. Feel free to contact anytime with any questions, problems, or concerns.  It was a pleasure to see you again today!  Websites that have reliable patient information: 1. American Academy of Asthma, Allergy, and Immunology: www.aaaai.org 2. Food Allergy Research and Education (FARE): foodallergy.org 3. Mothers of Asthmatics:  http://www.asthmacommunitynetwork.org 4. American College of Allergy, Asthma, and Immunology: www.acaai.org   COVID-19 Vaccine Information can be found at: For questions related to vaccine distribution or appointments, please email [email or web address] or call [phone].   We realize that you might be concerned about having an allergic reaction to the COVID19 vaccines. To help with that concern, WE ARE OFFERING THE COVID19 VACCINES IN OUR OFFICE! Ask the front desk for dates!     "Like" [place] on Facebook and Instagram for our latest updates!      A healthy democracy works best when [place] participate! Make sure you are registered to vote! If you have moved or changed any of your contact information, you will need to get this updated before voting!  In some cases, you MAY be able to register to vote online: [email or web address]

## 2021-10-25 NOTE — Progress Notes (Signed)
FOLLOW UP  Date of Service/Encounter:  10/25/21   Assessment:   Steroid and oxygen dependent asthma-COPD overlap syndrome - doing well on Fasenra   Multiple complications of chronic prednisone use, including avascular necrosis    AAT1 - MM phenotype with level of 141    Weaning steroids (currently on 7.5 mg and 5 mg every other day alternating)   Seasonal and perennial allergic rhinitis (horse, tobacco leaf, grasses, ragweed, trees, indoor molds, outdoor molds, dust mites, cat and dog)  Osteopenia   On disability  Plan/Recommendations:   1. Asthma-COPD overlap syndrome - Lung testing looks stable today.  - I am glad that Dr. Fransico Him is making sure we are weaning your prednisone in a medically appropriate way.  - We are giving you some samples of Breztri to try (two puffs twice daily). - [place] and [place] Form provided to get coverage of this.  - When using Breztri, do NOT use your Pulmicort and Perforomist!  - Daily controller medication(s): Pulmicort (budesonide) + Performist (formoterol) mixed together in nebulizer TWICE DAILY + prednisone 10mg  daily + Singulair 10mg  daily + Fasenra every 8 weeks - Rescue medications: albuterol 4 puffs every 4-6 hours as needed or albuterol nebulizer one vial every 4-6 hours as needed - Asthma control goals:  * Full participation in all desired activities (may need albuterol before activity) * Albuterol use two time or less a week on average (not counting use with activity) * Cough interfering with sleep two time or less a month * Oral steroids no more than once a year * No hospitalizations   2. Chronic rhinitis (horse, tobacco leaf, grasses, ragweed, trees, indoor molds, outdoor molds, dust mites, cat and dog) - Continue with: Zyrtec (cetirizine) 10mg  tablet once daily and Singulair (montelukast) 10mg  daily and Nasacort (triamcinolone) two sprays per nostril daily AS NEEDED - Continue with nasal saline rinses.  3. Follow up in three months or  earlier if needed.  Subjective:   [name] is a 56 y.o. female presenting today for follow up of  Chief Complaint  Patient presents with   Asthma-COPD overlap syndrome (HCC)    4 mth f/u - Asthma has been doing okay. Has had to use her emergency inhaler with the weather.     [name] has a history of the following: Patient Active Problem List   Diagnosis Date Noted   Endocrine disorder, unspecified 07/04/2021   Prediabetes 07/04/2021   Hypothyroidism 07/04/2021   Former smoker 06/29/2021   Seasonal and perennial allergic rhinitis 06/19/2021   Oxygen dependent 06/19/2021   Steroid dependent (HCC) 06/19/2021   Immunosuppressed status (HCC) 06/19/2021   Radiculopathy, lumbar region 12/08/2020   Status post left hip replacement 07/22/2020   Status post right hip replacement 05/02/2020   Status post total replacement of right hip 04/15/2020   Avascular necrosis of bone of left hip (HCC) 02/10/2020   Avascular necrosis of bone of right hip (HCC) 02/10/2020   Allergic rhinitis 05/21/2019   Asthma-COPD overlap syndrome (HCC) 04/30/2019   Chronic respiratory failure with hypoxia (HCC) 04/30/2019   Spinal stenosis of cervical region 04/05/2017   Partial tear of right subscapularis tendon    Incisional hernia, without obstruction or gangrene    Urinary frequency 03/21/2012   Migraine equivalent syndrome 03/21/2012   Insomnia 03/21/2012    History obtained from: chart review and patient.  [name] is a 56 y.o. female presenting for a follow up visit.  She was last seen in March 2023.  At that time, she was doing very well with a stable lung test.  We weaned her down to 7.5 mg of prednisone daily.  We continue with the Pulmicort and Perforomist mixed twice daily as well as Singulair.  She was also on Fasenra as a means to decrease her prednisone.  She was motivated to continue the wean, but we wanted to get endocrinology on board to help with ensuring that she did  not have any adrenal insufficiency after being on prednisone for so long.  She did establish care with Dr. Fransico Him.  He recommended doing 7.5 mg of prednisone every other day and 5 mg of prednisone every other day.  She was diagnosed with adrenal suppression.  He felt that she would likely need lifetime doses of prednisone because of this.  Her minimum amount of prednisone is going to be 5 mg.  He also made sure that all of her health maintenance was up-to-date, including bone density.  She was diagnosed with prediabetes with a hemoglobin A1c of 5.8%.  Since last visit with [place], she has done well.  Asthma/Respiratory Symptom History: She has been using her rescue inhaler more with the worsening air quality.  She is doing the nebulizer medications BID. This seems to be doing well for the most part. She does do some walking in the morning hours. She goes to the [facility] across from where she lives. She has been doing 5-6 laps (4 laps is a mile). When she gets home, she has to take a break. She has to sit down for the rest of the morning. She is on 3 L O2 via .   She wants to talk about Breztri. She is wondering if she could transition to this from her nebulized medications. She tells me that she has been on samples of this in the past and has not well with it. She is wondering if this would be covered by [place] and [place] (since she is on [place] and [place] to cover her [employer] Ch[employer]enger). Her current copay for her nebulized medications is around $80 per month.   Her oxygen is managed by [facility]. She was told that if her oxygen drops during exercise, she might have to increase her oxygen to 4 or 5 L. It has dereased down to the 70% range.  Apparently Dr. Sherene Sires told her that there were "too many cooks in the kitchen" and he was going to step out. In the interim, she did have a lung cancer screening that was all negative. She gets this done annually. Apparently her mother had lung cancer and this terrifies her. Her lung  CT was fairly reassuring with a read that demonstrated the following: Lung-RADS 2, benign appearance or behavior. Continue annual screening with low-dose chest CT without contrast in 12 months.  Allergic Rhinitis Symptom History: Her allergic rhinitis symptoms are under fairly good control with the Zyrtec as well as the Singulair.  She also uses Nasacort.  She is not interested in starting allergy shots.  Her symptoms are under fairly good control.  She has osteopenia and they are not going to do anything with that. She talked to Dr. Margo Aye yesterday for her annual appointment and he did mention doing anything regarding bone mineralization.   Otherwise, there have been no changes to her past medical history, surgical history, family history, or social history.    Review of Systems  Constitutional: Negative.  Negative for chills, fever, malaise/fatigue and weight loss.  HENT:  Negative for  congestion, ear discharge and ear pain.   Eyes:  Negative for pain, discharge and redness.  Respiratory:  Positive for shortness of breath. Negative for cough, sputum production and wheezing.   Cardiovascular: Negative.  Negative for chest pain and palpitations.  Gastrointestinal:  Negative for abdominal pain, constipation, diarrhea, heartburn, nausea and vomiting.  Skin: Negative.  Negative for itching and rash.  Neurological:  Negative for dizziness and headaches.  Endo/Heme/Allergies:  Negative for environmental allergies. Does not bruise/bleed easily.       Objective:   Blood pressure 102/64, pulse 82, temperature 98 F (36.7 C), resp. rate 19, height 5\' 5"  (1.651 m), weight 153 lb (69.4 kg), SpO2 98 %. Body mass index is 25.46 kg/m.    Physical Exam Vitals reviewed.  Constitutional:      Appearance: She is well-developed.     Comments: Wearing oxygen.  Smiling.  Cooperative with the exam.  Seems very upbeat.  HENT:     Head: Normocephalic and atraumatic.     Right Ear: Tympanic membrane,  ear canal and external ear normal.     Left Ear: Tympanic membrane, ear canal and external ear normal.     Nose: Mucosal edema and rhinorrhea present. No nasal deformity or septal deviation.     Right Turbinates: Enlarged, swollen and pale.     Left Turbinates: Enlarged, swollen and pale.     Right Sinus: No maxillary sinus tenderness or frontal sinus tenderness.     Left Sinus: No maxillary sinus tenderness or frontal sinus tenderness.     Comments: Nares dry from the oxygen cannula.  No nasal polyps.    Mouth/Throat:     Mouth: Mucous membranes are not pale and not dry.     Pharynx: Uvula midline.  Eyes:     General: Lids are normal. Allergic shiner present.        Right eye: No discharge.        Left eye: No discharge.     Conjunctiva/sclera: Conjunctivae normal.     Right eye: Right conjunctiva is not injected. No chemosis.    Left eye: Left conjunctiva is not injected. No chemosis.    Pupils: Pupils are equal, round, and reactive to light.  Cardiovascular:     Rate and Rhythm: Normal rate and regular rhythm.     Heart sounds: Normal heart sounds.  Pulmonary:     Effort: Pulmonary effort is normal. Tachypnea present. No accessory muscle usage or respiratory distress.     Breath sounds: Normal breath sounds. No wheezing, rhonchi or rales.     Comments: Moving air well in all lung fields. No increased work of breathing noted.  Chest:     Chest wall: No tenderness.  Lymphadenopathy:     Cervical: No cervical adenopathy.  Skin:    General: Skin is warm.     Capillary Refill: Capillary refill takes less than 2 seconds.     Coloration: Skin is not pale.     Findings: No abrasion, erythema, petechiae or rash. Rash is not papular, urticarial or vesicular.     Comments: No eczematous lesions noted.   Neurological:     Mental Status: She is alert.  Psychiatric:        Behavior: Behavior is cooperative.      Diagnostic studies:    Spirometry: results abnormal (FEV1: 1.60/61%,  FVC: 2.44/73%, FEV1/FVC: 66%).    Spirometry consistent with mixed obstructive and restrictive disease.   Allergy Studies: none  Salvatore Marvel, MD  [facility] [place]

## 2021-10-27 MED ORDER — SPACER/AERO-HOLDING CHAMBERS DEVI: 1.0000 | Freq: Every day | 2 refills | Status: AC | PRN

## 2021-10-27 NOTE — Addendum Note (Signed)
Addended by: Robet Leu A on: 10/27/2021 09:33 AM   Modules accepted: Orders

## 2021-10-30 ENCOUNTER — Emergency Department (HOSPITAL_COMMUNITY)
Admission: EM | Admit: 2021-10-30 | Discharge: 2021-10-30 | Disposition: A | Discharge status: Home / Self Care | Payer: Medicare Other | Attending: Emergency Medicine | Admitting: Emergency Medicine

## 2021-10-30 ENCOUNTER — Other Ambulatory Visit: Payer: Self-pay

## 2021-10-30 ENCOUNTER — Emergency Department (HOSPITAL_COMMUNITY): Payer: Medicare Other

## 2021-10-30 ENCOUNTER — Encounter (HOSPITAL_COMMUNITY): Payer: Self-pay

## 2021-10-30 ENCOUNTER — Telehealth: Payer: Self-pay

## 2021-10-30 DIAGNOSIS — D649 Anemia, unspecified: Secondary | ICD-10-CM | POA: Insufficient documentation

## 2021-10-30 DIAGNOSIS — J45909 Unspecified asthma, uncomplicated: Secondary | ICD-10-CM | POA: Diagnosis not present

## 2021-10-30 DIAGNOSIS — R7309 Other abnormal glucose: Secondary | ICD-10-CM | POA: Diagnosis not present

## 2021-10-30 DIAGNOSIS — R7989 Other specified abnormal findings of blood chemistry: Secondary | ICD-10-CM | POA: Insufficient documentation

## 2021-10-30 DIAGNOSIS — Z7952 Long term (current) use of systemic steroids: Secondary | ICD-10-CM | POA: Insufficient documentation

## 2021-10-30 DIAGNOSIS — R059 Cough, unspecified: Secondary | ICD-10-CM | POA: Diagnosis present

## 2021-10-30 DIAGNOSIS — J449 Chronic obstructive pulmonary disease, unspecified: Secondary | ICD-10-CM | POA: Insufficient documentation

## 2021-10-30 DIAGNOSIS — D72829 Elevated white blood cell count, unspecified: Secondary | ICD-10-CM | POA: Insufficient documentation

## 2021-10-30 DIAGNOSIS — Z7951 Long term (current) use of inhaled steroids: Secondary | ICD-10-CM | POA: Diagnosis not present

## 2021-10-30 LAB — BASIC METABOLIC PANEL
Anion gap: 10 (ref 5–15)
BUN: 10 mg/dL (ref 6–20)
CO2: 21 mmol/L — ABNORMAL LOW (ref 22–32)
Calcium: 9.4 mg/dL (ref 8.9–10.3)
Chloride: 106 mmol/L (ref 98–111)
Creatinine, Ser: 1.07 mg/dL — ABNORMAL HIGH (ref 0.44–1.00)
GFR, Estimated: >60 mL/min (ref >60)
Glucose, Bld: 104 mg/dL — ABNORMAL HIGH (ref 70–99)
Potassium: 3.6 mmol/L (ref 3.5–5.1)
Sodium: 137 mmol/L (ref 135–145)

## 2021-10-30 LAB — D-DIMER, QUANTITATIVE: D-Dimer, Quant: 0.65 ug/mL-FEU — ABNORMAL HIGH (ref 0.00–0.50)

## 2021-10-30 LAB — CBC WITH DIFFERENTIAL/PLATELET
Abs Immature Granulocytes: 0.26 10*3/uL — ABNORMAL HIGH (ref 0.00–0.07)
Basophils Absolute: 0.1 10*3/uL (ref 0.0–0.1)
Basophils Relative: 0 %
Eosinophils Absolute: 0 10*3/uL (ref 0.0–0.5)
Eosinophils Relative: 0 %
HCT: 32.8 % — ABNORMAL LOW (ref 36.0–46.0)
Hemoglobin: 10.5 g/dL — ABNORMAL LOW (ref 12.0–15.0)
Immature Granulocytes: 2 %
Lymphocytes Relative: 15 %
Lymphs Abs: 1.9 10*3/uL (ref 0.7–4.0)
MCH: 29.7 pg (ref 26.0–34.0)
MCHC: 32 g/dL (ref 30.0–36.0)
MCV: 92.7 fL (ref 80.0–100.0)
Monocytes Absolute: 0.9 10*3/uL (ref 0.1–1.0)
Monocytes Relative: 7 %
Neutro Abs: 9.7 10*3/uL — ABNORMAL HIGH (ref 1.7–7.7)
Neutrophils Relative %: 76 %
Platelets: 284 10*3/uL (ref 150–400)
RBC: 3.54 MIL/uL — ABNORMAL LOW (ref 3.87–5.11)
RDW: 13.5 % (ref 11.5–15.5)
WBC: 12.8 10*3/uL — ABNORMAL HIGH (ref 4.0–10.5)
nRBC: 0 % (ref 0.0–0.2)

## 2021-10-30 MED ORDER — IOHEXOL 350 MG/ML SOLN
75.0000 mL | Freq: Once | INTRAVENOUS | Status: AC | PRN
  Administered 2021-10-30: 75 mL via INTRAVENOUS

## 2021-10-30 MED ORDER — IPRATROPIUM-ALBUTEROL 0.5-2.5 (3) MG/3ML IN SOLN
3.0000 mL | Freq: Once | RESPIRATORY_TRACT | Status: AC
  Administered 2021-10-30: 3 mL via RESPIRATORY_TRACT
  Filled 2021-10-30: qty 3

## 2021-10-30 NOTE — Telephone Encounter (Signed)
She definitely lives on the edge of needing to go to the hospital. Her asthma-COPD overlap syndrome is sever.e Agree with the plan.   Malachi Bonds, MD [facility]

## 2021-10-30 NOTE — ED Notes (Addendum)
Pt ambulated to restroom and had coughing fit. Pt was sat down, O2 sats at 97% on RA. After resting for about a minute, pt ambulated the rest of the way to the room.

## 2021-10-30 NOTE — ED Notes (Signed)
Pt to CT

## 2021-10-30 NOTE — ED Notes (Signed)
RT called

## 2021-10-30 NOTE — ED Provider Notes (Signed)
[facility] EMERGENCY DEPARTMENT Provider Note   CSN: [id number] Arrival date & time: 10/30/21  3329     History  Chief Complaint  Patient presents with   Shortness of Breath    [name] is a 56 y.o. female.  HPI Patient here for evaluation of persistent cough with sensation of shortness of breath despite using prescribed medications.  She is also on oxygen at 3 L per nasal cannula, at home, as usual.  She saw her allergist last week and was taken off Pulmicort nebulizer, with a trial of Breztri inhaler initiated.  She started taking Mucinex, 3 days ago.  She is here with her husband who helps to give history.  She has not been having fever, productive sputum, nausea or vomiting.    Home Medications Prior to Admission medications   Medication Sig Start Date End Date Taking? Authorizing Provider  acetaminophen (TYLENOL) 500 MG tablet Take 1,000 mg by mouth every 6 (six) hours as needed for moderate pain.   Yes [provider]  albuterol (PROVENTIL) (2.5 MG/3ML) 0.083% nebulizer solution Take 3 mLs (2.5 mg total) by nebulization every 6 (six) hours as needed for wheezing or shortness of breath. 08/01/21  Yes Alfonse Spruce, MD  albuterol (VENTOLIN HFA) 108 (90 Base) MCG/ACT inhaler Inhale 2 puffs into the lungs every 4 (four) hours as needed for shortness of breath. 12/23/20  Yes Nehemiah Settle, FNP  Benralizumab [facility]) 30 MG/ML SOSY Inject 30 mg into the skin every 8 (eight) weeks.   Yes [provider]  Biotin 1000 MCG tablet Take 1,000 mcg by mouth daily.   Yes [provider]  Budeson-Glycopyrrol-Formoterol (BREZTRI AEROSPHERE) 160-9-4.8 MCG/ACT AERO Inhale 2 puffs into the lungs 2 (two) times daily.   Yes [provider]  budesonide (PULMICORT) 0.5 MG/2ML nebulizer solution Take 2 mLs (0.5 mg total) by nebulization 2 (two) times daily as needed. 10/25/21  Yes Alfonse Spruce, MD  cetirizine (ZYRTEC) 10 MG tablet Take 10  mg by mouth daily.   Yes [provider]  Cholecalciferol (VITAMIN D3 SUPER STRENGTH) 50 MCG (2000 UT) CAPS Take 2,000 Units by mouth daily.   Yes [provider]  escitalopram (LEXAPRO) 10 MG tablet Take 10 mg by mouth daily. 09/27/21  Yes [provider]  levothyroxine (SYNTHROID) 88 MCG tablet Take 1 tablet (88 mcg total) by mouth daily before breakfast. 10/05/21  Yes Nida, Denman George, MD  LORazepam (ATIVAN) 1 MG tablet Take 1.5 mg by mouth 2 (two) times daily. 03/01/19  Yes [provider]  montelukast (SINGULAIR) 10 MG tablet TAKE 1 TABLET BY MOUTH AT BEDTIME 07/10/21  Yes Nehemiah Settle, FNP  Multiple Vitamin (MULTIVITAMIN) tablet Take 1 tablet by mouth every evening.   Yes [provider]  pantoprazole (PROTONIX) 40 MG tablet Take 30- 60 min before your first and last meals of the day Patient taking differently: Take 40 mg by mouth daily. 11/17/19  Yes Nyoka Cowden, MD  pravastatin (PRAVACHOL) 40 MG tablet Take 40 mg by mouth at bedtime. 07/11/19  Yes [provider]  predniSONE (DELTASONE) 5 MG tablet Take 5 mg  and 7.5 mg on alternate days 07/04/21  Yes Nida, Denman George, MD  pregabalin (LYRICA) 100 MG capsule Take 100 mg by mouth 3 (three) times daily. 10/23/21  Yes [provider]  SUMAtriptan 6 MG/0.5ML SOAJ Inject 6 mg as directed daily as needed (migraine).  12/01/14  Yes [provider]  Triamcinolone Acetonide (NASACORT ALLERGY  24HR NA) Place 2 sprays into the nose daily as needed (allergies).   Yes [provider]  valACYclovir (VALTREX) 500 MG tablet Take 500-2,000 mg by mouth See admin instructions. Take 2000mg s at first sign of fever blister outbreak then take 500mg s daily until gone 04/22/16  Yes [provider]  formoterol (PERFOROMIST) 20 MCG/2ML nebulizer solution MIX WITH PULMICORT AND USE IN NEBULIZER TWICE DAILY 08/21/21   04/24/16, MD  Lancets [facility] DELICA PLUS  [place]) MISC Apply topically. 11/25/20   [provider]  pregabalin (LYRICA) 75 MG capsule Take 1 capsule by mouth twice daily Patient not taking: Reported on 10/25/2021 09/25/21   10/27/2021, PA-C  Spacer/Aero-Holding Chambers DEVI 1 each by Does not apply route daily as needed. 10/27/21   Kirtland Bouchard, MD      Allergies    Aspirin, Penicillins, and Vicodin [hydrocodone-acetaminophen]    Review of Systems   Review of Systems  Physical Exam Updated Vital Signs BP 102/68   Pulse 86   Temp 98.2 F (36.8 C)   Resp 18   Ht 5\' 4"  (1.626 m)   Wt 69.9 kg   SpO2 98%   BMI 26.43 kg/m  Physical Exam Vitals and nursing note reviewed.  Constitutional:      Appearance: She is well-developed. She is not ill-appearing.  HENT:     Head: Normocephalic and atraumatic.     Right Ear: External ear normal.     Left Ear: External ear normal.  Eyes:     Conjunctiva/sclera: Conjunctivae normal.     Pupils: Pupils are equal, round, and reactive to light.  Neck:     Trachea: Phonation normal.  Cardiovascular:     Rate and Rhythm: Normal rate and regular rhythm.     Heart sounds: Normal heart sounds.  Pulmonary:     Effort: Pulmonary effort is normal. No respiratory distress.     Breath sounds: Normal breath sounds. No stridor. No decreased breath sounds, wheezing or rhonchi.  Abdominal:     Palpations: Abdomen is soft.     Tenderness: There is no abdominal tenderness.  Musculoskeletal:        General: Normal range of motion.     Cervical back: Normal range of motion and neck supple.  Skin:    General: Skin is warm and dry.  Neurological:     Mental Status: She is alert and oriented to person, place, and time.     Cranial Nerves: No cranial nerve deficit.     Sensory: No sensory deficit.     Motor: No abnormal muscle tone.     Coordination: Coordination normal.  Psychiatric:        Mood and Affect: Mood normal.        Behavior: Behavior normal.        Thought  Content: Thought content normal.        Judgment: Judgment normal.     ED Results / Procedures / Treatments   Labs (all labs ordered are listed, but only abnormal results are displayed) Labs Reviewed  BASIC METABOLIC PANEL - Abnormal; Notable for the following components:      Result Value   CO2 21 (*)    Glucose, Bld 104 (*)    Creatinine, Ser 1.07 (*)    All other components within normal limits  CBC WITH DIFFERENTIAL/PLATELET - Abnormal; Notable for the following components:   WBC 12.8 (*)    RBC 3.54 (*)    Hemoglobin  10.5 (*)    HCT 32.8 (*)    Neutro Abs 9.7 (*)    Abs Immature Granulocytes 0.26 (*)    All other components within normal limits  D-DIMER, QUANTITATIVE - Abnormal; Notable for the following components:   D-Dimer, Quant 0.65 (*)    All other components within normal limits    EKG None  Radiology CT Angio Chest PE W/Cm &/Or Wo Cm  Result Date: 10/30/2021 CLINICAL DATA:  Shortness of breath dry cough EXAM: CT ANGIOGRAPHY CHEST WITH CONTRAST TECHNIQUE: Multidetector CT imaging of the chest was performed using the standard protocol during bolus administration of intravenous contrast. Multiplanar CT image reconstructions and MIPs were obtained to evaluate the vascular anatomy. RADIATION DOSE REDUCTION: This exam was performed according to the departmental dose-optimization program which includes automated exposure control, adjustment of the mA and/or kV according to patient size and/or use of iterative reconstruction technique. CONTRAST:  32mL OMNIPAQUE IOHEXOL 350 MG/ML SOLN COMPARISON:  08/30/2021 FINDINGS: Cardiovascular: Satisfactory opacification of the pulmonary arteries to the segmental level. No evidence of pulmonary embolism. Normal heart size. No pericardial effusion. Mediastinum/Nodes: No enlarged mediastinal, hilar, or axillary lymph nodes. Thyroid gland, trachea, and esophagus demonstrate no significant findings. Lungs/Pleura: Postsurgical changes in the  right apex. Centrilobular and paraseptal emphysema. No focal pulmonary opacity. Left basilar scarring. No pleural effusion or pneumothorax. Upper Abdomen: Status post cholecystectomy. No acute finding in the abdomen. Musculoskeletal: Prior cervical fusion. No acute osseous abnormality. Review of the MIP images confirms the above findings. IMPRESSION: Negative for pulmonary embolism.  No acute finding in the chest. Electronically Signed   By: Wiliam Ke M.D.   On: 10/30/2021 12:49    Procedures Procedures    Medications Ordered in ED Medications  ipratropium-albuterol (DUONEB) 0.5-2.5 (3) MG/3ML nebulizer solution 3 mL (3 mLs Nebulization Given 10/30/21 1211)  iohexol (OMNIPAQUE) 350 MG/ML injection 75 mL (75 mLs Intravenous Contrast Given 10/30/21 1238)    ED Course/ Medical Decision Making/ A&P                           Medical Decision Making Chronic asthma and emphysema, presenting for shortness of breath with cough which is nonproductive.  She has a complex allergy and asthma history, and has recently had some medication modifications by her allergist.  Amount and/or Complexity of Data Reviewed Independent Historian:     Details: She is a cogent historian External Data Reviewed: notes.    Details: Allergy visit, last week.  Details in the EMR.  She is being trialed on Breztri instead of inhaled steroids.  She is being weaned off chronic prednisone therapy currently taking alternate day 7.5 and 5 mg doses at the instructions of her endocrinologist. Labs: ordered.    Details: CBC, metabolic panel-normal except CO2 slightly low, glucose high, creatinine high, white count high, hemoglobin low Radiology: ordered and independent interpretation performed.    Details: CT chest, PE protocol-no infiltrate, no PE  Risk Prescription drug management. Decision regarding hospitalization. Risk Details: Patient with asthma and COPD presenting with shortness of breath and stable oxygenation on  usual nasal cannula supplementation.  Recently she changed from nebulizer steroids to inhaled steroids.  She is compromised by persistent cough which is not productive.  Patient was treated with a nebulizer, DuoNeb in the ED with improvement of her discomfort.  There is no indication for hospitalization at this time.  I have encouraged her to discuss ongoing management of symptoms with inhaled  steroid versus nebulizer steroid.  Patient agreeable to this process.  She does not require hospitalization at this time.  Have encouraged her to try adding dextromethorphan to her current treatment to see if that can help suppress her cough and make her more comfortable.           Final Clinical Impression(s) / ED Diagnoses Final diagnoses:  COPD with asthma [facility])    Rx / DC Orders ED Discharge Orders     None         Mancel Bale, MD 10/30/21 1730

## 2021-10-30 NOTE — Discharge Instructions (Signed)
The testing today does not show any serious problems like heart failure, respiratory distress or pneumonia.  It may be that the [name] is not helping you enough and you should go back to your nebulizer treatments.  It would be best to ask Dr. Dellis Watson about that.  In the meantime, for coughing, add dextromethorphan either independently or combined with guaifenesin, to help your coughing.  Return here if needed for problems

## 2021-10-30 NOTE — Telephone Encounter (Signed)
Patient called this morning and she wanted to make an appointment to be seen by Dr. Dellis Anes. Patient was struggling to breathe and was having a difficult time speaking due to her current flare. I advised patient to go to the hospital. Patient stated that she was going to wait until her husband returned home. I informed patient to call 911 and ask her husband to meet her there. Patient refused and stated that she was going to wait. I advised again that we strongly suggested that she call 911 to be taken to the ED due to her having trouble breathing all weekend. Patient stated that she would wait for her husband. I informed patient to call our office once she was discharged.

## 2021-10-30 NOTE — ED Triage Notes (Signed)
Patient has hx of COPD and asthma. Patient is on 3L/[place] all the time for hx. Patient states increased SHOB, since Thursday and has developed a dry cough over the weekend. Patient ambulatory in triage.

## 2021-10-31 ENCOUNTER — Encounter: Payer: Self-pay | Admitting: Allergy & Immunology

## 2021-11-01 ENCOUNTER — Telehealth: Payer: Self-pay | Admitting: Allergy & Immunology

## 2021-11-01 MED ORDER — PREDNISONE 10 MG PO TABS
10.0000 mg | ORAL_TABLET | Freq: Two times a day (BID) | ORAL | 0 refills | Status: AC
Start: 2021-11-01 — End: 2021-11-06

## 2021-11-01 MED ORDER — BUDESONIDE 0.5 MG/2ML IN SUSP: 0.5000 mg | Freq: Two times a day (BID) | RESPIRATORY_TRACT | 5 refills | Status: DC | PRN

## 2021-11-01 MED ORDER — FORMOTEROL FUMARATE 20 MCG/2ML IN NEBU: INHALATION_SOLUTION | RESPIRATORY_TRACT | 5 refills | Status: DC

## 2021-11-01 NOTE — Telephone Encounter (Signed)
Patient called to  discuss her lab results.She has a history of anemia and from the looks of it it seems to be anemia of chronic disease.   We saw her last week and we talked about changing back to [facility] from her Pulmicort and Perforomist. Wednesday night she had a cough and this worsened over the course of the next couple of days. She was using her rescue inhaler and she used it so many times that her entire body was jittery. This was Friday, Saturday, and Sunday. She went to the ED on Monday morning and she got a breathing treatment and a chest CT.   One of the reasons we changed to Markus Daft was because of the cost. We had her apply for [place]. She does use a spacer and she feels like it gets into the lungs effectively.   She does report that budesonide was the cheaper of the two nebulized medications.   New plan: GO BACK to the Perforomist and Pulmicort twice day  We are also going to add on prednisone to 10mg  twice daily for five days due to her history of adrenal insuffiencey.    , MD [facility]

## 2021-11-03 MED ORDER — BREZTRI AEROSPHERE 160-9-4.8 MCG/ACT IN AERO: 2.0000 | INHALATION_SPRAY | Freq: Two times a day (BID) | RESPIRATORY_TRACT | 12 refills | Status: DC

## 2021-11-03 NOTE — Telephone Encounter (Signed)
Called patient to inform that the [place] and [place] form has been faxed and that either we will hear from them or she will in 3-5 business days. She has also been informed she can use budesonide and breztri.

## 2021-11-03 NOTE — Telephone Encounter (Signed)
Yes we can do one treatment twice daily of the 0.5 mg dose. Please send in and sign on my behalf.   Malachi Bonds, MD [facility]

## 2021-11-03 NOTE — Telephone Encounter (Signed)
Patient returned completed AZ&ME form for [facility].   Form has been placed in provider's inbox for completion.  Forwarding message to provider.

## 2021-11-03 NOTE — Telephone Encounter (Signed)
Patient called in regards to where her application is with the AZ&ME for [facility]. She wants to go back to the treatment plan and use the Breztri with Budesonide. Patient is saying that the Formoterol is $144.

## 2021-11-03 NOTE — Telephone Encounter (Signed)
Dr Dellis Anes,   Are you okay with her doing breztri and budesonide?  Form is currently being completed in [place]. Once faxed, we will update the patient.

## 2021-11-03 NOTE — Addendum Note (Signed)
Addended by: Alfonse Spruce on: 11/03/2021 11:42 AM   Modules accepted: Orders

## 2021-11-03 NOTE — Telephone Encounter (Signed)
Az & Me Faxed, labeled and patient informed .

## 2021-11-06 ENCOUNTER — Other Ambulatory Visit: Payer: Self-pay | Admitting: *Deleted

## 2021-11-06 MED ORDER — FASENRA 30 MG/ML ~~LOC~~ SOSY: 30.0000 mg | PREFILLED_SYRINGE | SUBCUTANEOUS | 6 refills | Status: DC

## 2021-11-06 NOTE — Telephone Encounter (Signed)
Received Breztri APPROVAL from AZ&ME:  Approval dates: 11/03/21 - 04/08/22  Per Kate/AZ&ME - prescription is in the process of being filled and mailed to patient.  Called to advise patient of approval and status of medication being mailed to her. Patient verbalized understanding, no further questions.

## 2021-11-27 ENCOUNTER — Ambulatory Visit (INDEPENDENT_AMBULATORY_CARE_PROVIDER_SITE_OTHER): Payer: Medicare Other

## 2021-11-27 DIAGNOSIS — J4550 Severe persistent asthma, uncomplicated: Secondary | ICD-10-CM

## 2021-11-29 ENCOUNTER — Telehealth: Payer: Self-pay | Admitting: Internal Medicine

## 2021-11-29 ENCOUNTER — Ambulatory Visit: Payer: Medicare Other | Admitting: Allergy & Immunology

## 2021-11-29 ENCOUNTER — Encounter: Payer: Self-pay | Admitting: Allergy & Immunology

## 2021-11-29 VITALS — BP 108/70 | HR 82 | Temp 97.6°F | Resp 16

## 2021-11-29 DIAGNOSIS — J449 Chronic obstructive pulmonary disease, unspecified: Secondary | ICD-10-CM | POA: Diagnosis not present

## 2021-11-29 DIAGNOSIS — D849 Immunodeficiency, unspecified: Secondary | ICD-10-CM | POA: Diagnosis not present

## 2021-11-29 DIAGNOSIS — Z9981 Dependence on supplemental oxygen: Secondary | ICD-10-CM | POA: Diagnosis not present

## 2021-11-29 DIAGNOSIS — J302 Other seasonal allergic rhinitis: Secondary | ICD-10-CM

## 2021-11-29 DIAGNOSIS — J3089 Other allergic rhinitis: Secondary | ICD-10-CM | POA: Diagnosis not present

## 2021-11-29 DIAGNOSIS — F1920 Other psychoactive substance dependence, uncomplicated: Secondary | ICD-10-CM

## 2021-11-29 NOTE — Patient Instructions (Addendum)
1. Asthma-COPD overlap syndrome - Lung testing looks stable today, actually slightly BETTER!  - I am glad that Dr. Fransico Him is making sure we are weaning your prednisone in a medically appropriate way.  - I am not going to make any changes at this time to your prednisone therapy.  - NEW SPACER and teaching provided. - You need to breathe out BEFORE you put your mouth on the spacer and THEN breathe in.  - We will help get you in to see Dr. Vassie Loll here in [place].  - Daily controller medication(s): Breztri two puffs TWICE DAILY WITH SPACER + prednisone 5mg /7.5mg  daily + Singulair 10mg  daily + Fasenra every 8 weeks - Rescue medications: albuterol 4 puffs every 4-6 hours as needed or albuterol nebulizer one vial every 4-6 hours as needed - Asthma control goals:  * Full participation in all desired activities (may need albuterol before activity) * Albuterol use two time or less a week on average (not counting use with activity) * Cough interfering with sleep two time or less a month * Oral steroids no more than once a year * No hospitalizations   2. Chronic rhinitis (horse, tobacco leaf, grasses, ragweed, trees, indoor molds, outdoor molds, dust mites, cat and dog) - Continue with: Zyrtec (cetirizine) 10mg  tablet once daily and Singulair (montelukast) 10mg  daily and Nasacort (triamcinolone) two sprays per nostril daily AS NEEDED - Continue with nasal saline rinses.  3. Return in about 2 months (around 01/29/2022).    Please inform of any Emergency Department visits, hospitalizations, or changes in symptoms. Call before going to the ED for breathing or allergy symptoms since we might be able to fit you in for a sick visit. Feel free to contact anytime with any questions, problems, or concerns.  It was a pleasure to see you again today! You look so good today!   Websites that have reliable patient information: 1. American Academy of Asthma, Allergy, and Immunology: www.aaaai.org 2. Food  Allergy Research and Education (FARE): foodallergy.org 3. Mothers of Asthmatics: http://www.asthmacommunitynetwork.org 4. American College of Allergy, Asthma, and Immunology: www.acaai.org   COVID-19 Vaccine Information can be found at: 01/31/2022 For questions related to vaccine distribution or appointments, please email [email or web address] or call [phone].   We realize that you might be concerned about having an allergic reaction to the COVID19 vaccines. To help with that concern, WE ARE OFFERING THE COVID19 VACCINES IN OUR OFFICE! Ask the front desk for dates!     "Like" [place] on Facebook and Instagram for our latest updates!      A healthy democracy works best when [place] participate! Make sure you are registered to vote! If you have moved or changed any of your contact information, you will need to get this updated before voting!  In some cases, you MAY be able to register to vote online: [email or web address]

## 2021-11-29 NOTE — Progress Notes (Signed)
FOLLOW UP  Date of Service/Encounter:  11/29/21   Assessment:   Steroid and oxygen dependent asthma-COPD overlap syndrome - doing well on Fasenra   Multiple complications of chronic prednisone use, including avascular necrosis    AAT1 - MM phenotype with level of 141    Weaning steroids (currently on 7.5 mg and 5 mg every other day alternating)   Seasonal and perennial allergic rhinitis (horse, tobacco leaf, grasses, ragweed, trees, indoor molds, outdoor molds, dust mites, cat and dog)   Osteopenia   On disability    Plan/Recommendations:   1. Asthma-COPD overlap syndrome - Lung testing looks stable today, actually slightly BETTER!  - I am glad that Dr. Fransico Him is making sure we are weaning your prednisone in a medically appropriate way.  - I am not going to make any changes at this time to your prednisone therapy.  - NEW SPACER and teaching provided. - You need to breathe out BEFORE you put your mouth on the spacer and THEN breathe in.  - We will help get you in to see Dr. Vassie Loll here in [place].  - Daily controller medication(s): Breztri two puffs TWICE DAILY WITH SPACER + prednisone 5mg /7.5mg  daily + Singulair 10mg  daily + Fasenra every 8 weeks - Rescue medications: albuterol 4 puffs every 4-6 hours as needed or albuterol nebulizer one vial every 4-6 hours as needed - Asthma control goals:  * Full participation in all desired activities (may need albuterol before activity) * Albuterol use two time or less a week on average (not counting use with activity) * Cough interfering with sleep two time or less a month * Oral steroids no more than once a year * No hospitalizations   2. Chronic rhinitis (horse, tobacco leaf, grasses, ragweed, trees, indoor molds, outdoor molds, dust mites, cat and dog) - Continue with: Zyrtec (cetirizine) 10mg  tablet once daily and Singulair (montelukast) 10mg  daily and Nasacort (triamcinolone) two sprays per nostril daily AS NEEDED - Continue  with nasal saline rinses.  3. Return in about 2 months (around 01/29/2022).    Subjective:   [name] is a 56 y.o. female presenting today for follow up of  Chief Complaint  Patient presents with   Asthma    Has had some asthma attacks since her last visit and she did use her rescue inhaler.     [name] has a history of the following: Patient Active Problem List   Diagnosis Date Noted   Endocrine disorder, unspecified 07/04/2021   Prediabetes 07/04/2021   Hypothyroidism 07/04/2021   Former smoker 06/29/2021   Seasonal and perennial allergic rhinitis 06/19/2021   Oxygen dependent 06/19/2021   Steroid dependent (HCC) 06/19/2021   Immunosuppressed status (HCC) 06/19/2021   Radiculopathy, lumbar region 12/08/2020   Status post left hip replacement 07/22/2020   Status post right hip replacement 05/02/2020   Status post total replacement of right hip 04/15/2020   Avascular necrosis of bone of left hip (HCC) 02/10/2020   Avascular necrosis of bone of right hip (HCC) 02/10/2020   Allergic rhinitis 05/21/2019   Asthma-COPD overlap syndrome (HCC) 04/30/2019   Chronic respiratory failure with hypoxia (HCC) 04/30/2019   Spinal stenosis of cervical region 04/05/2017   Partial tear of right subscapularis tendon    Incisional hernia, without obstruction or gangrene    Urinary frequency 03/21/2012   Migraine equivalent syndrome 03/21/2012   Insomnia 03/21/2012    History obtained from: chart review and patient.  [name] is a 56 y.o. female  presenting for a follow up visit. She was last seen in July 2023. AT that time, her lung testing looked stable. Dr. Fransico Him was managing her steroid taper. We provided her with samples of Breztri to try. She has had difficulty affording her medications in the past. She was also continued on Pulmicort and Perforomist BID in combination with Singulair, Fasenra, and prednisone. Her rhinitis was controlled with cetirizine as well as  Singulair and Nasacort.   Since the last visit, she has largely done well.  Asthma/Respiratory Symptom History: She remains on her Breztri 2 puffs in the morning and 2 puffs at night.  She had some coughing after the last visit which the ER attributed to her new inhaler, Breztri, but she does not think that was related at all.  She feels that the Markus Daft is working much more effectively than the nebulized treatments.  At one point, we had asked her to add on budesonide twice a day in addition to the [place], but she is no longer doing that.  She has not been using her rescue inhaler much at all.  She feels that her breathing is under fair control.  She has not been on a prednisone burst since last time we saw her.  She remains on 3 L of oxygen at baseline, which she increases to 4 L when she is doing any physical activity.  She has a treadmill at home that she normally uses, but she has shinsplints right now.  She is doing more chair yoga than anything else right now.  She has not seen pulmonology recently.  She was previously followed by Dr. Sherene Sires feels that she might connect better to a different physician.  Allergic Rhinitis Symptom History: For her allergic rhinitis, she remains on her nose spray as well as her antihistamine.  She also is on the Singulair.  He has not needed antibiotics at all for any sinus infections or ear infections.  She has been having a more productive cough which she thinks is from her lungs.  It is clear sputum.  She has had no fever with this.  Otherwise, there have been no changes to her past medical history, surgical history, family history, or social history.    Review of Systems  Constitutional: Negative.  Negative for chills, fever, malaise/fatigue and weight loss.  HENT:  Negative for congestion, ear discharge and ear pain.   Eyes:  Negative for pain, discharge and redness.  Respiratory:  Positive for shortness of breath. Negative for cough, sputum production and  wheezing.   Cardiovascular: Negative.  Negative for chest pain and palpitations.  Gastrointestinal:  Negative for abdominal pain, constipation, diarrhea, heartburn, nausea and vomiting.  Skin: Negative.  Negative for itching and rash.  Neurological:  Negative for dizziness and headaches.  Endo/Heme/Allergies:  Negative for environmental allergies. Does not bruise/bleed easily.       Objective:   Blood pressure 108/70, pulse 82, temperature 97.6 F (36.4 C), resp. rate 16, SpO2 97 %. There is no height or weight on file to calculate BMI.    Physical Exam Vitals reviewed.  Constitutional:      Appearance: She is well-developed.     Comments: Wearing oxygen.  Smiling.  Cooperative with the exam.  Seems very upbeat. She seems to have gained some weight, which is probably a good thing.   HENT:     Head: Normocephalic and atraumatic.     Right Ear: Tympanic membrane, ear canal and external ear normal.  Left Ear: Tympanic membrane, ear canal and external ear normal.     Nose: Mucosal edema and rhinorrhea present. No nasal deformity or septal deviation.     Right Turbinates: Enlarged, swollen and pale.     Left Turbinates: Enlarged, swollen and pale.     Right Sinus: No maxillary sinus tenderness or frontal sinus tenderness.     Left Sinus: No maxillary sinus tenderness or frontal sinus tenderness.     Comments: Nares dry from the oxygen cannula.  No nasal polyps.    Mouth/Throat:     Mouth: Mucous membranes are not pale and not dry.     Pharynx: Uvula midline.  Eyes:     General: Lids are normal. Allergic shiner present.        Right eye: No discharge.        Left eye: No discharge.     Conjunctiva/sclera: Conjunctivae normal.     Right eye: Right conjunctiva is not injected. No chemosis.    Left eye: Left conjunctiva is not injected. No chemosis.    Pupils: Pupils are equal, round, and reactive to light.  Cardiovascular:     Rate and Rhythm: Normal rate and regular rhythm.      Heart sounds: Normal heart sounds.  Pulmonary:     Effort: Pulmonary effort is normal. Tachypnea present. No accessory muscle usage or respiratory distress.     Breath sounds: Normal breath sounds. No wheezing, rhonchi or rales.     Comments: Moving air well in all lung fields. No increased work of breathing noted.  Chest:     Chest wall: No tenderness.  Lymphadenopathy:     Cervical: No cervical adenopathy.  Skin:    General: Skin is warm.     Capillary Refill: Capillary refill takes less than 2 seconds.     Coloration: Skin is not pale.     Findings: No abrasion, erythema, petechiae or rash. Rash is not papular, urticarial or vesicular.     Comments: No eczematous lesions noted.   Neurological:     Mental Status: She is alert.  Psychiatric:        Behavior: Behavior is cooperative.      Diagnostic studies:    Spirometry: results abnormal (FEV1: 1.73/68%, FVC: 2.30/71%, FEV1/FVC: 75%).    Spirometry consistent with possible restrictive disease.  Overall, these are stable and the FEV1 is actually slightly higher than that obtained at the last visit.     Allergy Studies: none        Malachi Bonds, MD  [facility]

## 2021-11-29 NOTE — Telephone Encounter (Signed)
Pt sees Dr. Sherene Sires and wanted to change to Dr. Vassie Loll, Pt having congestion and needed to be shown how to use spacer.  Message sent from [place] to front desk about appt.  Dr. Dellis Anes had in the office notes for pt to come and see Dr. Vassie Loll. There is an opening on Friday 8/25 but it is a 15 minute slot at 10:00, Dr. Vassie Loll are you okay with [place] putting this patient  in the 15 minute slot to see you?

## 2021-11-30 ENCOUNTER — Telehealth: Payer: Self-pay

## 2021-11-30 NOTE — Telephone Encounter (Signed)
Patient would like to get her next Covid vaccine when available.

## 2021-11-30 NOTE — Telephone Encounter (Signed)
error 

## 2021-11-30 NOTE — Telephone Encounter (Signed)
Ok'd by Dr. Vassie Loll to see him.  Pt scheduled 11/30/21 with Dr. Vassie Loll at 10 a.m.  Pt aware.  Will close encounter.

## 2021-12-01 ENCOUNTER — Ambulatory Visit: Payer: Medicare Other | Admitting: Pulmonary Disease

## 2021-12-01 ENCOUNTER — Encounter: Payer: Self-pay | Admitting: Pulmonary Disease

## 2021-12-01 DIAGNOSIS — J449 Chronic obstructive pulmonary disease, unspecified: Secondary | ICD-10-CM | POA: Diagnosis not present

## 2021-12-01 DIAGNOSIS — J9611 Chronic respiratory failure with hypoxia: Secondary | ICD-10-CM

## 2021-12-01 NOTE — Assessment & Plan Note (Signed)
She was able to maintain saturations at 92% on walking without oxygen and this is more in line with her PFTs I encouraged her to stay off oxygen as much as possible in the daytime but continue to use this during sleep and exertion

## 2021-12-01 NOTE — Assessment & Plan Note (Signed)
Variability in her FEV1 on serial spirometry and positive bronchodilator response on previous PFTs suggests a major component of asthma.  She has done well with starting Harrington Challenger and only had 1 major exacerbation in July requiring increase steroids. She is now tolerating Breztri and would continue this with albuterol for rescue. Harrington Challenger is being dosed by allergist every 8 weeks She does have considerable anxiety around her symptoms and I encouraged her to start on pulmonary rehab where we could help teach her symptom control and management.

## 2021-12-01 NOTE — Patient Instructions (Signed)
  X Amb sat  X refer to pulm rehab

## 2021-12-01 NOTE — Progress Notes (Signed)
Subjective:    Patient ID: [name], female    DOB: 12-06-1965, 56 y.o.   MRN: [id number]  HPI 89 yowf ex smoker who presents to establish care for asthma/COPD overlap.  She was previously following with my partner Dr. Sherene Sires She has chronic hypoxic respiratory failure on oxygen at 4 L and chronic rhinitis Quit smoking 2009  She reports asthma onset since 1995 - 12/26/20 started Harrington Challenger  PMH -  -Tension Pneumothorax 12/2001 s/p VATS w/ mechanical pleurodesis on right -Dr. Maren Beach  -Multiple complications of chronic prednisone use, including avascular necrosis , total hip replacement, Osteopenia -Adrenal insufficiency - Dr Fransico Him - Seasonal and perennial allergic rhinitis (horse, tobacco leaf, grasses, ragweed, trees, indoor molds, outdoor molds, dust mites, cat and dog) Dr. Dellis Anes   Meds - Likes stiolto but can't afford on her insurance and has also tried /failed trelegy  due to either cost or upper airway side effects  Last flareup was 10/30/2021 requiring an ED visit for cough and steroids She reports sticky white mucus, uses albuterol MDI just once daily. She reports [place] HA class III dyspnea on usual activities.  Reviewed last notes from allergist and endocrinologist. She is now on Breztri and tolerating well. She uses 3 to 4 L POC She reports considerable anxiety around her symptoms especially if she gets into a coughing spell with shortness of breath  Significant tests/ events reviewed  12/01/2021 ambulatory sat decreased from 95 to 92% on room air, reported shortness of breath  CTA chest 10/2021 neg 08/2021 LDCT Mild centrilobular and paraseptal emphysematous changes, upper lung predominant.  Scattered small pulmonary nodules largest RML 5 mm  11/17/2019   Walked 4lpm her POC  approx   300 ft  @ slow/[place]  stopped due to  Sob and R hip and leg pain with sats still 98%     - 03/06/20 IgE  1115  IgE 05/27/2018   3590 with Eosinophil count 800/mcL > [place]    AAT1 - MM phenotype with level of 141    PFTs 07/2019 moderate airflow obstruction and restriction with FEV1 at 62%, ratio 66, FVC 74%, post BD FEV1 79%significant bronchodilator response with positive reversibility, 27% change, significant mid flow reversibility (137% change), DLCO improved at 65%. Spirometry 06/2021 -ratio 72, FEV1 74%, FVC 82% 10/2021 -ratio 66, FEV1 61%, FVC 73% 11/2021-ratio 75, FEV1 60%, FVC 71%  Review of Systems   neg for any significant sore throat, dysphagia, itching, sneezing, nasal congestion or excess/ purulent secretions, fever, chills, sweats, unintended wt loss, pleuritic or exertional cp, hempoptysis, orthopnea pnd or change in chronic leg swelling. Also denies presyncope, palpitations, heartburn, abdominal pain, nausea, vomiting, diarrhea or change in bowel or urinary habits, dysuria,hematuria, rash, arthralgias, visual complaints, headache, numbness weakness or ataxia.     Objective:   Physical Exam  Gen. Pleasant, well-nourished, in no distress, normal affect ENT - no pallor,icterus, no post nasal drip Neck: No JVD, no thyromegaly, no carotid bruits Lungs: Mild kyphosis no use of accessory muscles, no dullness to percussion, decreased bilateral without rales or rhonchi  Cardiovascular: Rhythm regular, heart sounds  normal, no murmurs or gallops, no peripheral edema Abdomen: soft and non-tender, no hepatosplenomegaly, BS normal. Musculoskeletal: No deformities, no cyanosis or clubbing Neuro:  alert, non focal       Assessment & Plan:    Adrenal insufficiency -being managed by endocrine.  She is maintained on 5/7.5 mg of prednisone alternating.  If she has a period Of stability with  her asthma, only then would attempt to taper but very gradually over a period of months to a year

## 2021-12-23 IMAGING — RF DG HIP (WITH PELVIS) OPERATIVE*R*
1 series · 3 of 3 positions shown · non-contrast
Comparison: April 12, 2000.

CLINICAL DATA: Status post right total hip arthroplasty.

EXAM:
OPERATIVE right HIP (WITH PELVIS IF PERFORMED) 3 VIEWS
TECHNIQUE: Fluoroscopic spot image(s) were submitted for interpretation
post-operatively.
Radiation exposure index: 1.8078 mGy.

[Series 1: unknown protocol · 0.20mm/px · 3 of 3 slices shown]
[im 1/3]
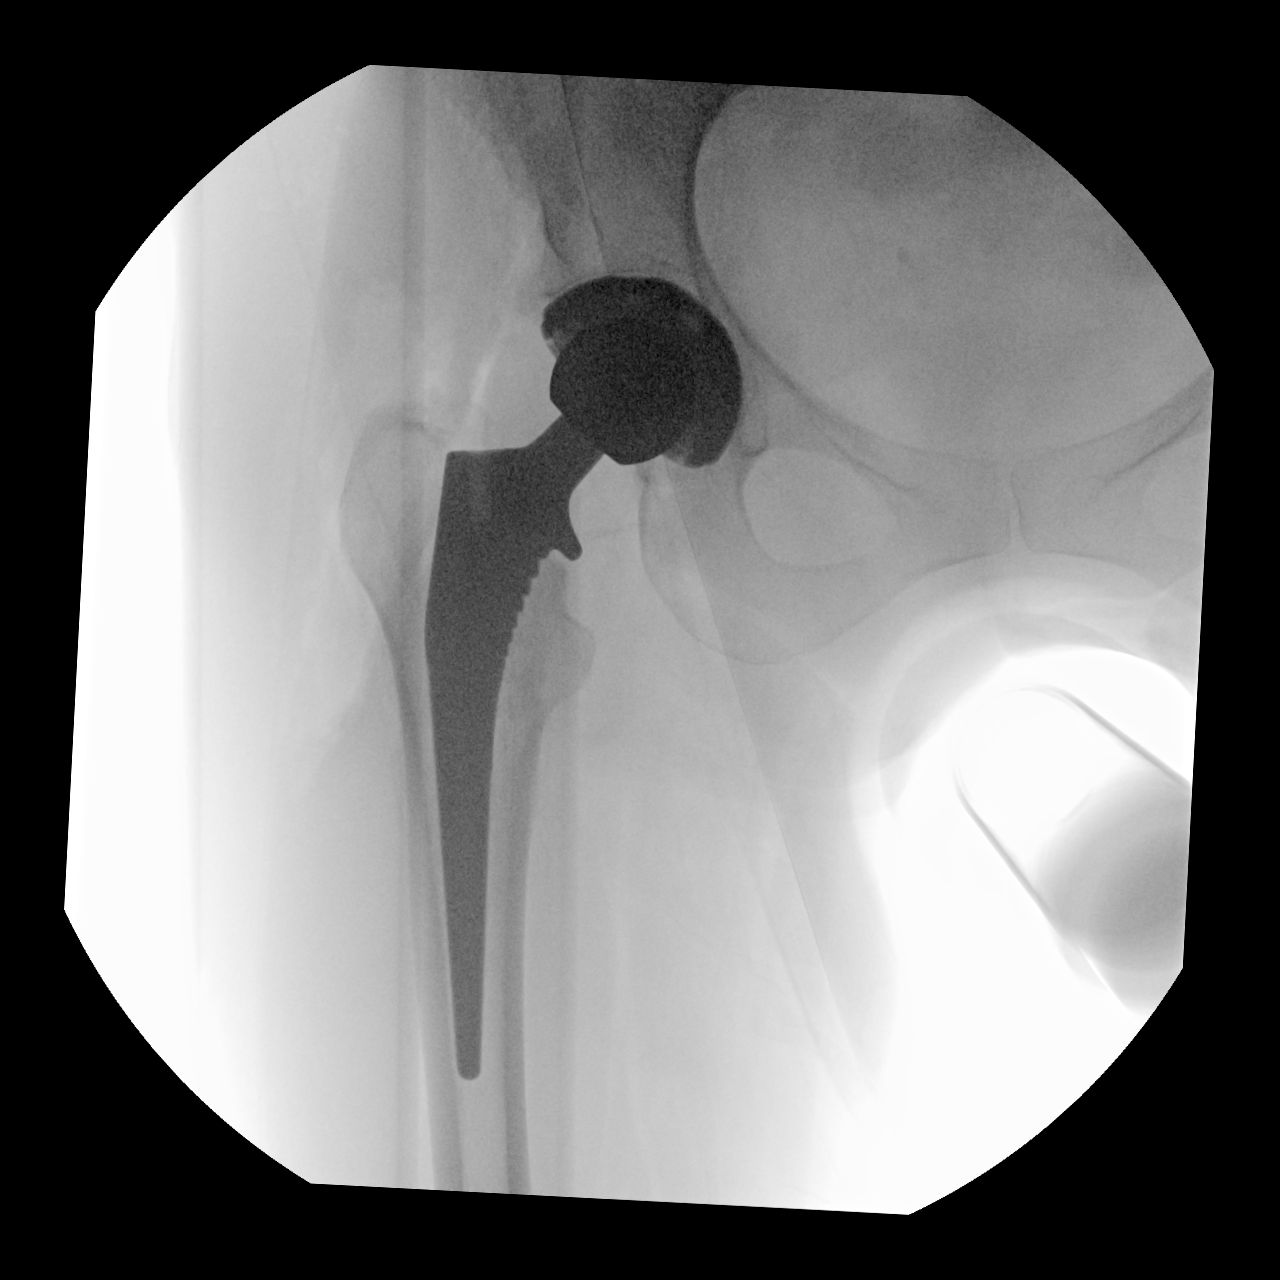
[im 2/3]
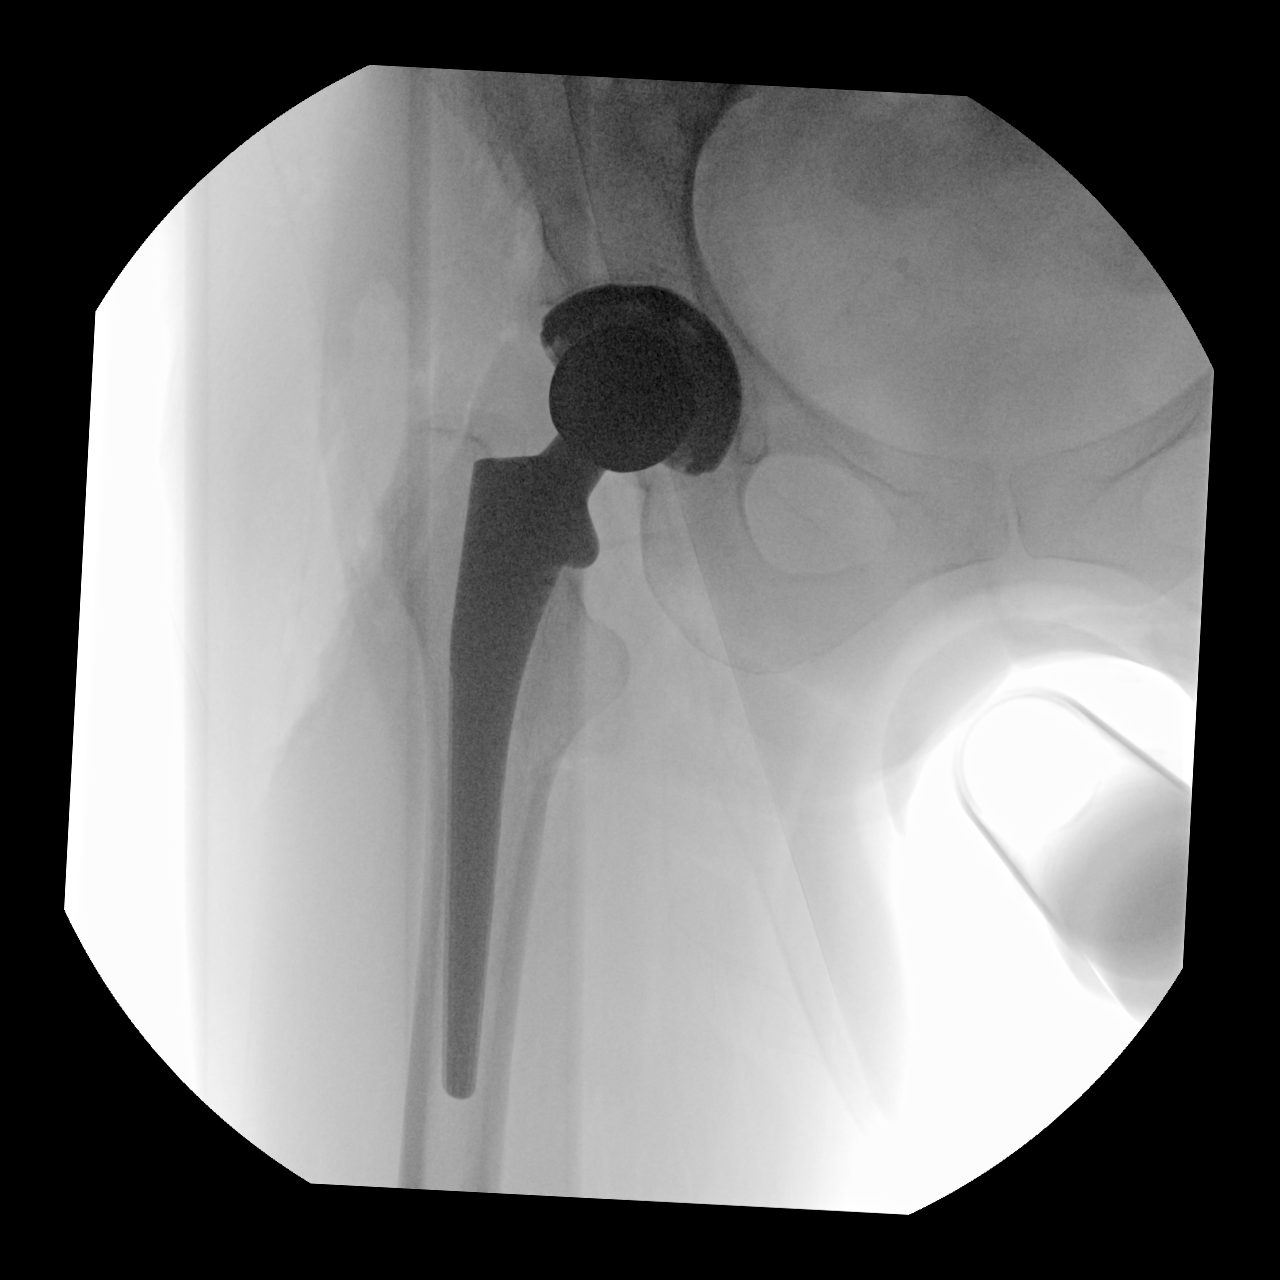
[im 3/3]
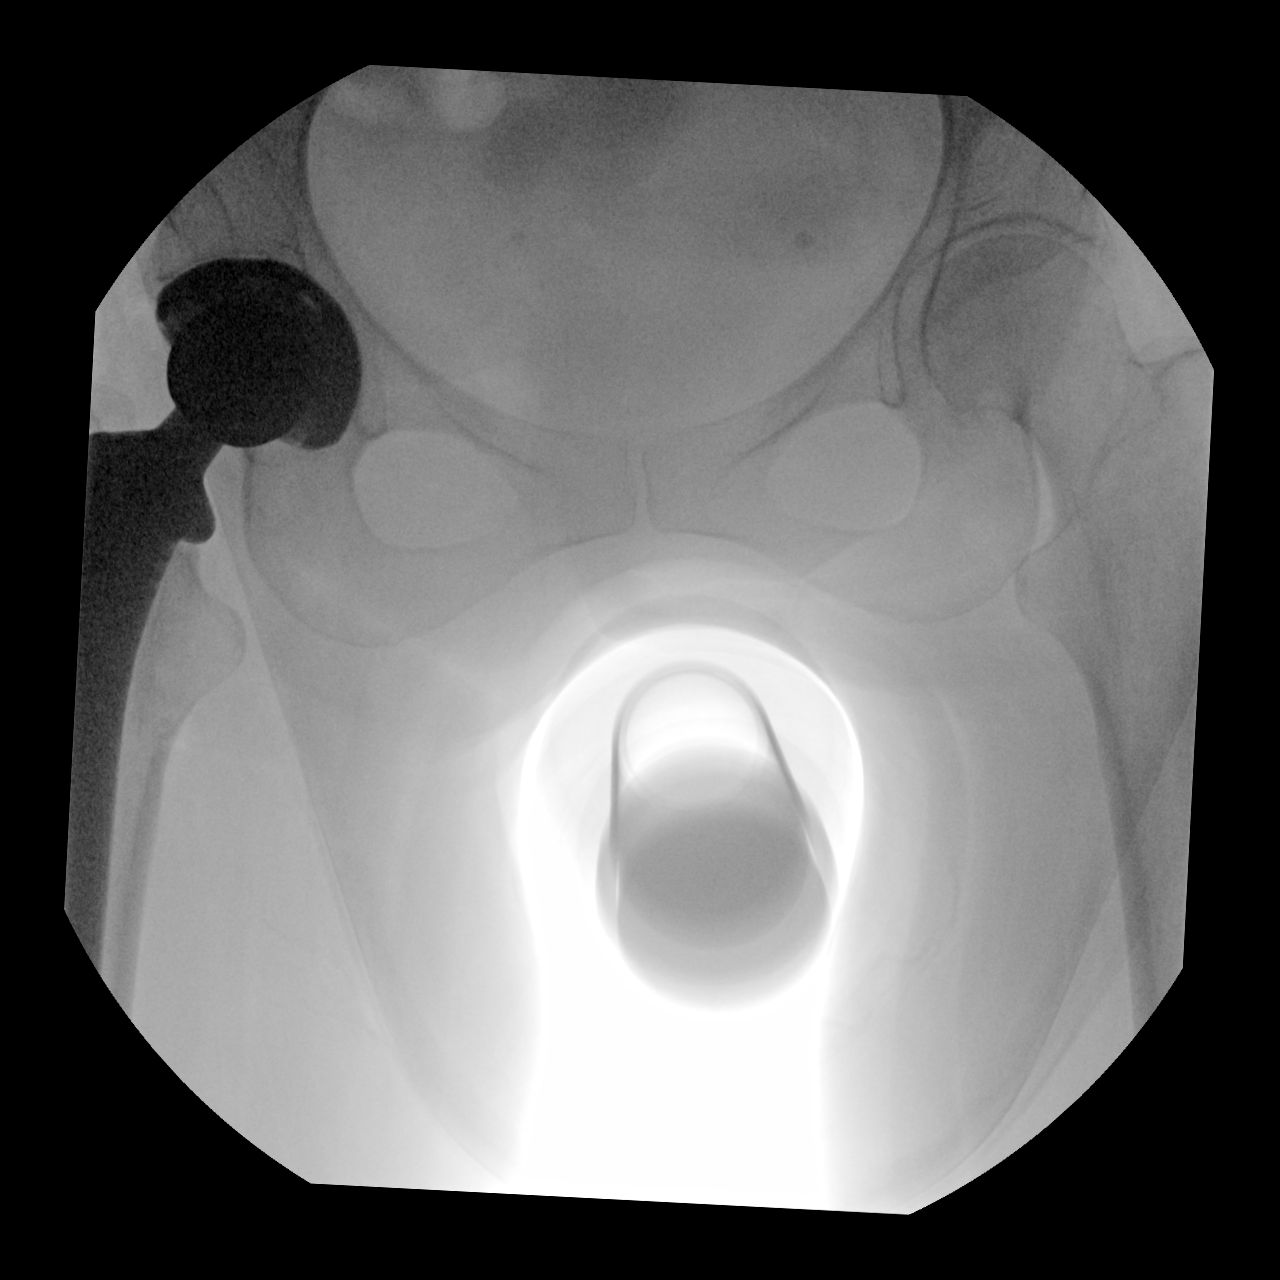

[3 of 3 positions shown; findings below may reference images not displayed]

FINDINGS: Three intraoperative fluoroscopic images of the right hip were
obtained. The right femoral and acetabular components are well
situated.
IMPRESSION: Status post right total hip arthroplasty.

## 2021-12-31 ENCOUNTER — Other Ambulatory Visit: Payer: Self-pay | Admitting: Family

## 2022-01-24 ENCOUNTER — Ambulatory Visit: Payer: Medicare Other

## 2022-01-31 ENCOUNTER — Ambulatory Visit: Payer: Medicare Other | Admitting: Allergy & Immunology

## 2022-01-31 ENCOUNTER — Ambulatory Visit: Payer: Medicare Other

## 2022-01-31 ENCOUNTER — Encounter: Payer: Self-pay | Admitting: Allergy & Immunology

## 2022-01-31 VITALS — BP 108/78 | HR 105 | Temp 98.3°F | Resp 18

## 2022-01-31 DIAGNOSIS — J4550 Severe persistent asthma, uncomplicated: Secondary | ICD-10-CM

## 2022-01-31 DIAGNOSIS — F1920 Other psychoactive substance dependence, uncomplicated: Secondary | ICD-10-CM

## 2022-01-31 DIAGNOSIS — Z9981 Dependence on supplemental oxygen: Secondary | ICD-10-CM

## 2022-01-31 DIAGNOSIS — J4489 Other specified chronic obstructive pulmonary disease: Secondary | ICD-10-CM | POA: Diagnosis not present

## 2022-01-31 DIAGNOSIS — J302 Other seasonal allergic rhinitis: Secondary | ICD-10-CM

## 2022-01-31 DIAGNOSIS — D849 Immunodeficiency, unspecified: Secondary | ICD-10-CM

## 2022-01-31 DIAGNOSIS — J3089 Other allergic rhinitis: Secondary | ICD-10-CM

## 2022-01-31 DIAGNOSIS — J069 Acute upper respiratory infection, unspecified: Secondary | ICD-10-CM

## 2022-01-31 NOTE — Patient Instructions (Addendum)
1. Asthma-COPD overlap syndrome - Lung testing looks a little low today, which is likely indicating of your current illness. - Start the albuterol mixed with budesonide three times daily and continue for one week. - Do not hesitate to add on prednisone 20mg  1-2 times daily if you are not getting better in the next couple of days.   - Add on Mucinex twice daily.  - Daily controller medication(s): Breztri two puffs TWICE DAILY WITH SPACER + prednisone 5mg /7.5mg  daily + Singulair 10mg  daily + Fasenra every 8 weeks - Rescue medications: albuterol 4 puffs every 4-6 hours as needed or albuterol nebulizer one vial every 4-6 hours as needed - Asthma control goals:  * Full participation in all desired activities (may need albuterol before activity) * Albuterol use two time or less a week on average (not counting use with activity) * Cough interfering with sleep two time or less a month * Oral steroids no more than once a year * No hospitalizations   2. Chronic rhinitis (horse, tobacco leaf, grasses, ragweed, trees, indoor molds, outdoor molds, dust mites, cat and dog) - Continue with: Zyrtec (cetirizine) 10mg  tablet once daily and Singulair (montelukast) 10mg  daily and Nasacort (triamcinolone) two sprays per nostril daily AS NEEDED - Continue with nasal saline rinses.  3. Return in about 4 months (around 06/03/2022).    Please inform us of any Emergency Department visits, hospitalizations, or changes in symptoms. Call us before going to the ED for breathing or allergy symptoms since we might be able to fit you in for a sick visit. Feel free to contact us anytime with any questions, problems, or concerns.  It was a pleasure to see you again today! You look so good today!   Websites that have reliable patient information: 1. American Academy of Asthma, Allergy, and Immunology: www.aaaai.org 2. Food Allergy Research and Education (FARE): foodallergy.org 3. Mothers of Asthmatics:  http://www.asthmacommunitynetwork.org 4. American College of Allergy, Asthma, and Immunology: www.acaai.org   COVID-19 Vaccine Information can be found at: ShippingScam.co.uk For questions related to vaccine distribution or appointments, please email [email or web address] or call [phone].   We realize that you might be concerned about having an allergic reaction to the COVID19 vaccines. To help with that concern, WE ARE OFFERING THE COVID19 VACCINES IN OUR OFFICE! Ask the front desk for dates!     "Like" [place] on Facebook and Instagram for our latest updates!      A healthy democracy works best when [employer] participate! Make sure you are registered to vote! If you have moved or changed any of your contact information, you will need to get this updated before voting!  In some cases, you MAY be able to register to vote online: [email or web address]

## 2022-01-31 NOTE — Progress Notes (Signed)
FOLLOW UP  Date of Service/Encounter:  01/31/22   Assessment:   Steroid and oxygen dependent asthma-COPD overlap syndrome - doing well on Fasenra   Multiple complications of chronic prednisone use, including avascular necrosis    AAT1 - MM phenotype with level of 141    Weaning steroids (currently on 7.5 mg and 5 mg every other day alternating)   Seasonal and perennial allergic rhinitis (horse, tobacco leaf, grasses, ragweed, trees, indoor molds, outdoor molds, dust mites, cat and dog)   Osteopenia   On disability  Recent self-directed weaning of the Lexapro    Plan/Recommendations:   1. Asthma-COPD overlap syndrome - Lung testing looks a little low today, which is likely indicating of your current illness. - Start the albuterol mixed with budesonide three times daily and continue for one week. - Do not hesitate to add on prednisone 20mg  1-2 times daily if you are not getting better in the next couple of days.   - Add on Mucinex twice daily.  - Daily controller medication(s): Breztri two puffs TWICE DAILY WITH SPACER + prednisone 5mg /7.5mg  daily + Singulair 10mg  daily + Fasenra every 8 weeks - Rescue medications: albuterol 4 puffs every 4-6 hours as needed or albuterol nebulizer one vial every 4-6 hours as needed - Asthma control goals:  * Full participation in all desired activities (may need albuterol before activity) * Albuterol use two time or less a week on average (not counting use with activity) * Cough interfering with sleep two time or less a month * Oral steroids no more than once a year * No hospitalizations   2. Chronic rhinitis (horse, tobacco leaf, grasses, ragweed, trees, indoor molds, outdoor molds, dust mites, cat and dog) - Continue with: Zyrtec (cetirizine) 10mg  tablet once daily and Singulair (montelukast) 10mg  daily and Nasacort (triamcinolone) two sprays per nostril daily AS NEEDED - Continue with nasal saline rinses.  3. Return in about 4 months  (around 06/03/2022).    Subjective:   [name] is a 56 y.o. female presenting today for follow up of  Chief Complaint  Patient presents with   Asthma    Heavyness on her chest. Has been having a harder time breathing. Has been having some cough. Has used albuterol about 3/4 times in the last few days.    Allergic Rhinitis     Some symptoms here and there     [name] has a history of the following: Patient Active Problem List   Diagnosis Date Noted   Endocrine disorder, unspecified 07/04/2021   Prediabetes 07/04/2021   Hypothyroidism 07/04/2021   Former smoker 06/29/2021   Seasonal and perennial allergic rhinitis 06/19/2021   Oxygen dependent 06/19/2021   Steroid dependent (HCC) 06/19/2021   Immunosuppressed status (HCC) 06/19/2021   Radiculopathy, lumbar region 12/08/2020   Status post left hip replacement 07/22/2020   Status post right hip replacement 05/02/2020   Status post total replacement of right hip 04/15/2020   Avascular necrosis of bone of left hip (HCC) 02/10/2020   Avascular necrosis of bone of right hip (HCC) 02/10/2020   Allergic rhinitis 05/21/2019   Asthma-COPD overlap syndrome 04/30/2019   Chronic respiratory failure with hypoxia (HCC) 04/30/2019   Spinal stenosis of cervical region 04/05/2017   Partial tear of right subscapularis tendon    Incisional hernia, without obstruction or gangrene    Urinary frequency 03/21/2012   Migraine equivalent syndrome 03/21/2012   Insomnia 03/21/2012    History obtained from: chart review and patient.  [name] is a 56 y.o. female presenting for a follow up visit. She was last seen in August 2023. At that time, her lung testing looked stable. We continued with the use of Breztri two puffs BID as well as montelukast and Fasenra every 8 weeks. She remains on the cetirizine 10mg  daily as well as Singulair and Nasacort.   Since the last visit, she reports that she has had a bit of a cold. She has  felt out of sorts for a few days now. She reports feeling disoriented.  She tells me that this is similar to how she felt when she was herself off of Lexapro.  She apparently was on Lexapro for a long period of time but decided on her own to wean off.  She reports that she had fairly severe withdrawals to the point where she thought she would have to go to the hospital.  She has been completely on Lexapro for approximately 3 to 4 months and has returned to baseline between and the Lexapro and is currently of disorientation started.  She does endorse some sinus pain and pressure.  She has not started using Mucinex.  He has a nebulizer which she uses 1-2 times a day at baseline anyway. She remains on her alternating doses of prednisone; she has not increased it with the current episode at this point in time.   Asthma/Respiratory Symptom History: She remains on her Breztri two puffs twice daily. She has been compliant on her which she received today. She is on 3 L O2 via nasal canula. Her oxygen has been stable. She uses a [employer] at home.  She has monitor her pulse ox at home which has remained stable.  She has had budesonide at home but has not started that.   Allergic Rhinitis Symptom History: She remains on the cetirizine as well as the Nasacort and Singulair. She is doing well on this regimen.  She has not needed antibiotics at all. She hs overall done very well.   Otherwise, there have been no changes to her past medical history, surgical history, family history, or social history.    Review of Systems  Constitutional: Negative.  Negative for chills, fever, malaise/fatigue and weight loss.  HENT:  Negative for congestion, ear discharge and ear pain.   Eyes:  Negative for pain, discharge and redness.  Respiratory:  Positive for shortness of breath. Negative for cough, sputum production and wheezing.   Cardiovascular: Negative.  Negative for chest pain and palpitations.   Gastrointestinal:  Negative for abdominal pain, constipation, diarrhea, heartburn, nausea and vomiting.  Skin: Negative.  Negative for itching and rash.  Neurological:  Negative for dizziness and headaches.  Endo/Heme/Allergies:  Negative for environmental allergies. Does not bruise/bleed easily.       Objective:   Blood pressure 108/78, pulse (!) 105, temperature 98.3 F (36.8 C), resp. rate 18, SpO2 98 %. There is no height or weight on file to calculate BMI.    Physical Exam Vitals reviewed.  Constitutional:      Appearance: She is well-developed.     Comments: Wearing oxygen.  Smiling.  Cooperative with the exam.  She seems less upbeat than previous exams. She is able to answer questions fully.   HENT:     Head: Normocephalic and atraumatic.     Right Ear: Tympanic membrane, ear canal and external ear normal.     Left Ear: Tympanic membrane, ear canal and external ear normal.     Nose:  Mucosal edema and rhinorrhea present. No nasal deformity or septal deviation.     Right Turbinates: Enlarged, swollen and pale.     Left Turbinates: Enlarged, swollen and pale.     Right Sinus: No maxillary sinus tenderness or frontal sinus tenderness.     Left Sinus: No maxillary sinus tenderness or frontal sinus tenderness.     Comments: Nares dry from the oxygen cannula.  No nasal polyps. Some clear rhinorrhea bilaterally.     Mouth/Throat:     Mouth: Mucous membranes are not pale and not dry.     Pharynx: Uvula midline.  Eyes:     General: Lids are normal. Allergic shiner present.        Right eye: No discharge.        Left eye: No discharge.     Conjunctiva/sclera: Conjunctivae normal.     Right eye: Right conjunctiva is not injected. No chemosis.    Left eye: Left conjunctiva is not injected. No chemosis.    Pupils: Pupils are equal, round, and reactive to light.  Cardiovascular:     Rate and Rhythm: Normal rate and regular rhythm.     Heart sounds: Normal heart sounds.   Pulmonary:     Effort: Pulmonary effort is normal. Tachypnea present. No accessory muscle usage or respiratory distress.     Breath sounds: Normal breath sounds. No wheezing, rhonchi or rales.     Comments: Moving air well in all lung fields. No increased work of breathing noted.  Chest:     Chest wall: No tenderness.  Lymphadenopathy:     Cervical: No cervical adenopathy.  Skin:    General: Skin is warm.     Capillary Refill: Capillary refill takes less than 2 seconds.     Coloration: Skin is not pale.     Findings: No abrasion, erythema, petechiae or rash. Rash is not papular, urticarial or vesicular.     Comments: No eczematous lesions noted.   Neurological:     Mental Status: She is alert.  Psychiatric:        Behavior: Behavior is cooperative.      Diagnostic studies:    Spirometry: results abnormal (FEV1: 1.58/62%, FVC: 2.09/65%, FEV1/FVC: 76%).    Spirometry consistent with possible restrictive disease. Overall values are slightly lower than those obtained at the last visit.    Allergy Studies: none        Salvatore Marvel, MD  [facility] [place]

## 2022-02-03 LAB — BASIC METABOLIC PANEL WITH GFR
BUN: 13 (ref 4–21)
CO2: 23 — AB (ref 13–22)
Chloride: 102 (ref 99–108)
Creatinine: 1.1 (ref 0.5–1.1)
Glucose: 98
Potassium: 4.6 meq/L (ref 3.5–5.1)
Sodium: 141 (ref 137–147)

## 2022-02-03 LAB — COMPREHENSIVE METABOLIC PANEL
Albumin: 4.5 (ref 3.5–5.0)
Calcium: 9.8 (ref 8.7–10.7)
Globulin: 1.8

## 2022-02-03 LAB — HEMOGLOBIN A1C: Hemoglobin A1C: 5.8

## 2022-02-03 LAB — TSH: TSH: 0.55 (ref 0.41–5.90)

## 2022-02-03 LAB — LIPID PANEL
Cholesterol: 187 (ref 0–200)
HDL: 59 (ref 35–70)
LDL Cholesterol: 101
Triglycerides: 155 (ref 40–160)

## 2022-02-03 LAB — HEPATIC FUNCTION PANEL
ALT: 20 U/L (ref 7–35)
AST: 19 (ref 13–35)
Alkaline Phosphatase: 76 (ref 25–125)
Bilirubin, Total: 0.3

## 2022-03-14 ENCOUNTER — Telehealth: Payer: Self-pay | Admitting: *Deleted

## 2022-03-14 ENCOUNTER — Other Ambulatory Visit (HOSPITAL_COMMUNITY): Payer: Self-pay

## 2022-03-14 MED ORDER — FASENRA PEN 30 MG/ML ~~LOC~~ SOAJ
30.0000 mg | SUBCUTANEOUS | 11 refills | Status: DC
Start: 2022-03-14 — End: 2022-07-09
  Filled 2022-03-14 – 2022-04-18 (×3): qty 1, 56d supply, fill #0
  Filled 2022-07-04: qty 1, 56d supply, fill #1

## 2022-03-14 NOTE — Telephone Encounter (Signed)
Called patient to discuss her denial for free drug Fasenra from [facility] and she now has MCD which should qualify her for LIS. Will get approval and send Rx to [facility] the [employer]

## 2022-03-18 ENCOUNTER — Other Ambulatory Visit: Payer: Self-pay | Admitting: "Endocrinology

## 2022-03-19 ENCOUNTER — Other Ambulatory Visit (HOSPITAL_COMMUNITY): Payer: Self-pay

## 2022-03-19 DIAGNOSIS — G3184 Mild cognitive impairment, so stated: Secondary | ICD-10-CM | POA: Insufficient documentation

## 2022-03-26 ENCOUNTER — Other Ambulatory Visit: Payer: Self-pay | Admitting: "Endocrinology

## 2022-03-26 ENCOUNTER — Ambulatory Visit (INDEPENDENT_AMBULATORY_CARE_PROVIDER_SITE_OTHER): Payer: Medicare Other

## 2022-03-26 DIAGNOSIS — J4550 Severe persistent asthma, uncomplicated: Secondary | ICD-10-CM | POA: Diagnosis not present

## 2022-03-26 DIAGNOSIS — E039 Hypothyroidism, unspecified: Secondary | ICD-10-CM

## 2022-03-26 DIAGNOSIS — J4489 Other specified chronic obstructive pulmonary disease: Secondary | ICD-10-CM

## 2022-03-30 ENCOUNTER — Ambulatory Visit: Payer: Medicare Other | Admitting: Pulmonary Disease

## 2022-03-30 ENCOUNTER — Encounter: Payer: Self-pay | Admitting: Pulmonary Disease

## 2022-03-30 VITALS — BP 134/68 | HR 88 | Ht 64.0 in | Wt 149.6 lb

## 2022-03-30 DIAGNOSIS — J9611 Chronic respiratory failure with hypoxia: Secondary | ICD-10-CM | POA: Diagnosis not present

## 2022-03-30 DIAGNOSIS — E2740 Unspecified adrenocortical insufficiency: Secondary | ICD-10-CM | POA: Diagnosis not present

## 2022-03-30 DIAGNOSIS — J4489 Other specified chronic obstructive pulmonary disease: Secondary | ICD-10-CM

## 2022-03-30 DIAGNOSIS — T380X5A Adverse effect of glucocorticoids and synthetic analogues, initial encounter: Secondary | ICD-10-CM | POA: Insufficient documentation

## 2022-03-30 NOTE — Assessment & Plan Note (Signed)
Oxygen saturation appears okay today.  I have asked her to try and stay off oxygen during the daytime. She does feel like she is dependent and has documented low sats at home

## 2022-03-30 NOTE — Progress Notes (Signed)
Subjective:    Patient ID: [name], female    DOB: 24-Jun-1965, 56 y.o.   MRN: [id number]  HPI  61 yowf ex smoker for FU of asthma/COPD overlap.  She was previously following with my partner Dr. Sherene Sires She has chronic hypoxic respiratory failure on oxygen since 2020 at 4 L and chronic rhinitis Quit smoking 2009  She reports asthma onset since 1995 -has Variability in her FEV1 on serial spirometry and positive bronchodilator response on previous PFTs - 12/26/20 started Fasenra   PMH -  -Tension Pneumothorax 12/2001 s/p VATS w/ mechanical pleurodesis on right -Dr. Maren Beach  -Multiple complications of chronic prednisone use, including avascular necrosis , total hip replacement, Osteopenia -Adrenal insufficiency - Dr Fransico Him - Seasonal and perennial allergic rhinitis (horse, tobacco leaf, grasses, ragweed, trees, indoor molds, outdoor molds, dust mites, cat and dog) Dr. Dellis Anes     Meds - Likes stiolto but can't afford on her insurance and has also tried /failed trelegy  due to either cost or upper airway side effects  Chief Complaint  Patient presents with   Follow-up    Feels she is doing well      Last flareup was 10/30/2021 requiring an ED visit for cough and steroids 57-month follow-up visit. She is compliant Breztri.  Albuterol MDI gives her jitters she occasionally uses albuterol neb for chest tightness She is now on 8 weekly dosing of Fasenra. She has an [employer] POC, states that oxygen level dropped as low as 85% when she moves around at home  On ambulation today, oxygen saturation stayed at 95%  Significant tests/ events reviewed   12/01/2021 ambulatory sat decreased from 95 to 92% on room air, reported shortness of breath   CTA chest 10/2021 neg 08/2021 LDCT Mild centrilobular and paraseptal emphysematous changes, upper lung predominant.  Scattered small pulmonary nodules largest RML 5 mm   11/17/2019   Walked 4lpm her POC  approx   300 ft  @ slow/[place]   stopped due to  Sob and R hip and leg pain with sats still 98%      - 03/06/20 IgE  1115  IgE 05/27/2018   3590 with Eosinophil count 800/mcL > [place]    AAT1 - MM phenotype with level of 141      PFTs 07/2019 moderate airflow obstruction and restriction with FEV1 at 62%, ratio 66, FVC 74%, post BD FEV1 79%significant bronchodilator response with positive reversibility, 27% change, significant mid flow reversibility (137% change), DLCO improved at 65%.  Spirometry 06/2021 -ratio 72, FEV1 74%, FVC 82% 10/2021 -ratio 66, FEV1 61%, FVC 73% 11/2021-ratio 75, FEV1 60%, FVC 71% 01/2022 -spirometry -FEV1 62%    Review of Systems neg for any significant sore throat, dysphagia, itching, sneezing, nasal congestion or excess/ purulent secretions, fever, chills, sweats, unintended wt loss, pleuritic or exertional cp, hempoptysis, orthopnea pnd or change in chronic leg swelling. Also denies presyncope, palpitations, heartburn, abdominal pain, nausea, vomiting, diarrhea or change in bowel or urinary habits, dysuria,hematuria, rash, arthralgias, visual complaints, headache, numbness weakness or ataxia.     Objective:   Physical Exam   Gen. Pleasant, well-nourished, in no distress ENT - no thrush, no pallor/icterus,no post nasal drip Neck: No JVD, no thyromegaly, no carotid bruits Lungs: no use of accessory muscles, no dullness to percussion, decreased without rales or rhonchi  Cardiovascular: Rhythm regular, heart sounds  normal, no murmurs or gallops, no peripheral edema Musculoskeletal: No deformities, no cyanosis or clubbing  Assessment & Plan:

## 2022-03-30 NOTE — Patient Instructions (Signed)
   X Amb sat  Continue on [employer]

## 2022-03-30 NOTE — Assessment & Plan Note (Signed)
Continue Breztri. Use albuterol nebs for rescue. Continue Fasenra every 8 weeks, allergy following. We discussed action plan for COPD/asthma exacerbation

## 2022-03-30 NOTE — Assessment & Plan Note (Signed)
She remains on 5/7.5 mg of prednisone alternating. Hopeful that this can be decreased to 5 mg daily, endocrine following

## 2022-03-31 IMAGING — DX DG PORTABLE PELVIS
1 series · 1 of 1 positions shown · non-contrast
Comparison: Portable exam 8787 hours compared to 04/15/2020

CLINICAL DATA: Post LEFT hip replacement

EXAM:
PORTABLE PELVIS 1-2 VIEWS

[pelvis ap]
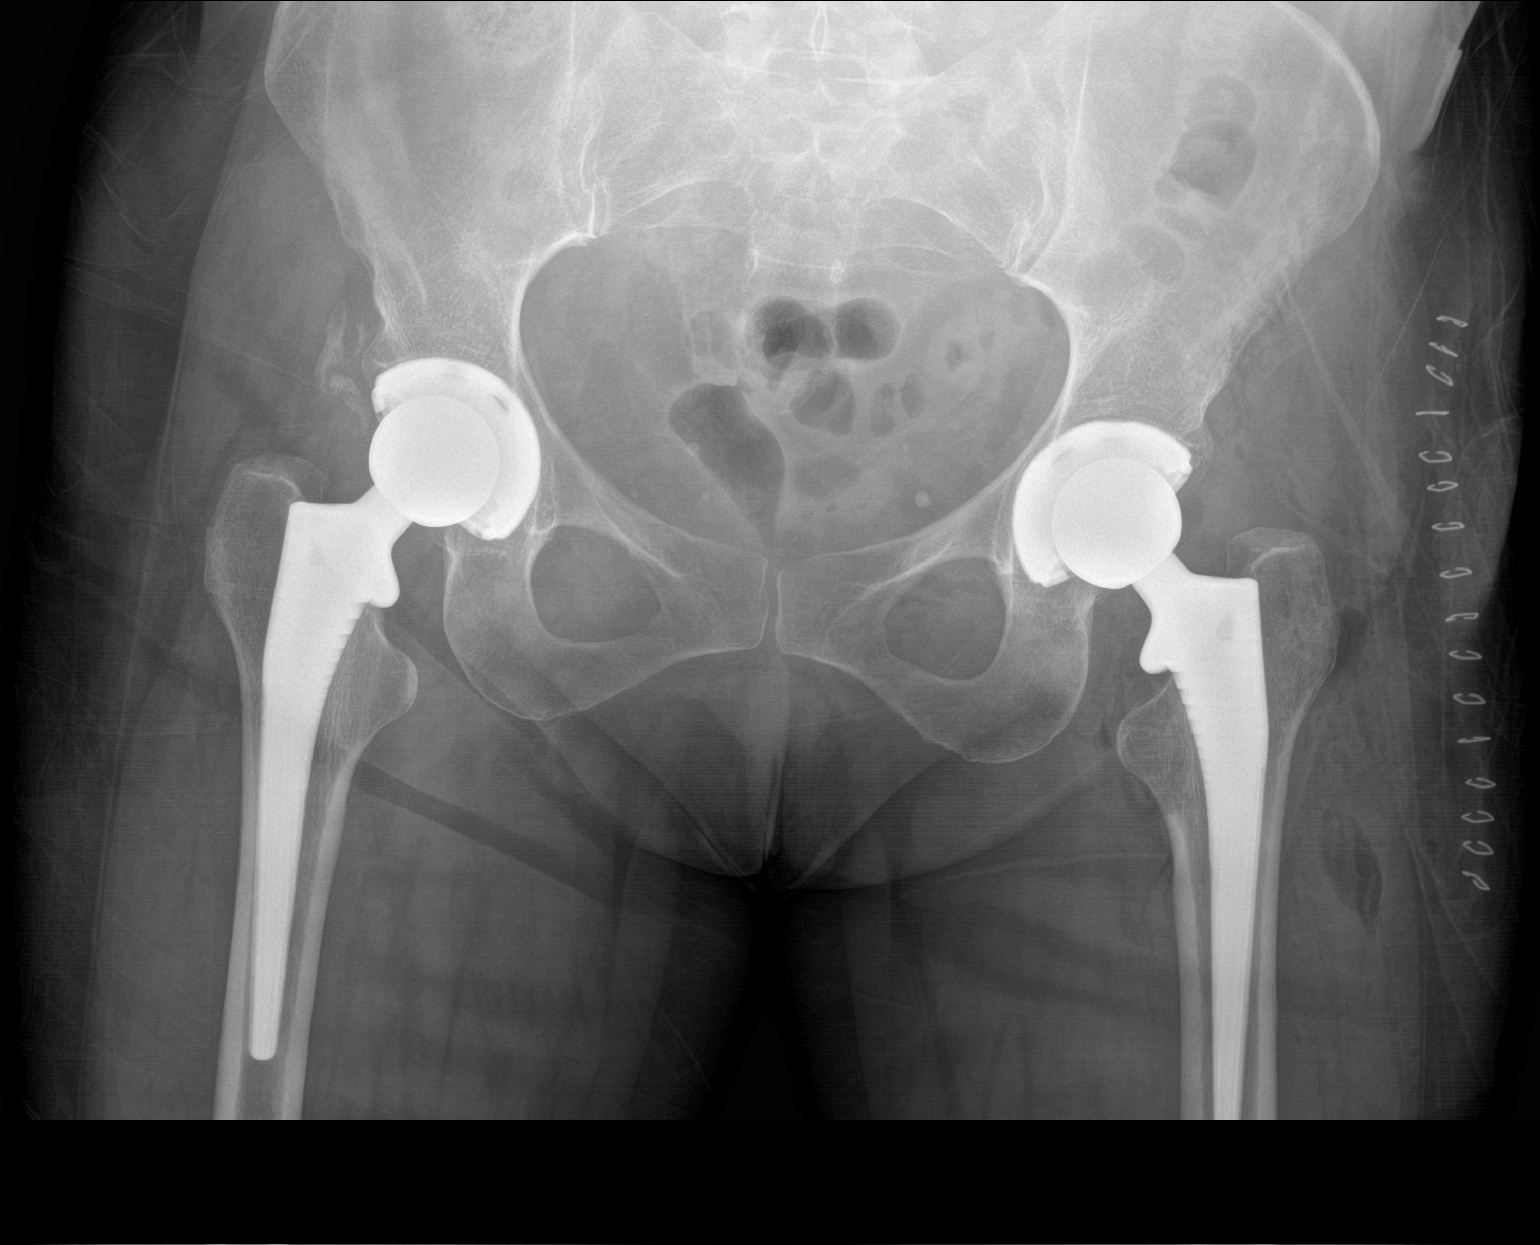

[1 of 1 positions shown; findings below may reference images not displayed]

FINDINGS: BILATERAL hip prostheses identified, new on LEFT.

Bones demineralized.

No fracture, dislocation or periprosthetic lucency.

SI joints preserved.

Skin clips laterally at LEFT hip region.
IMPRESSION: New LEFT hip prosthesis without acute complication.

## 2022-04-06 ENCOUNTER — Ambulatory Visit: Payer: Medicare Other | Admitting: Pulmonary Disease

## 2022-04-06 LAB — T4, FREE: Free T4: 1.48 ng/dL (ref 0.82–1.77)

## 2022-04-06 LAB — TSH: TSH: 0.129 u[IU]/mL — ABNORMAL LOW (ref 0.450–4.500)

## 2022-04-11 ENCOUNTER — Encounter: Payer: Self-pay | Admitting: "Endocrinology

## 2022-04-11 ENCOUNTER — Ambulatory Visit (INDEPENDENT_AMBULATORY_CARE_PROVIDER_SITE_OTHER): Payer: BLUE CROSS/BLUE SHIELD | Admitting: "Endocrinology

## 2022-04-11 VITALS — BP 114/66 | HR 92 | Ht 64.0 in | Wt 148.0 lb

## 2022-04-11 DIAGNOSIS — T380X5D Adverse effect of glucocorticoids and synthetic analogues, subsequent encounter: Secondary | ICD-10-CM

## 2022-04-11 DIAGNOSIS — R7303 Prediabetes: Secondary | ICD-10-CM

## 2022-04-11 DIAGNOSIS — E782 Mixed hyperlipidemia: Secondary | ICD-10-CM | POA: Diagnosis not present

## 2022-04-11 DIAGNOSIS — E2740 Unspecified adrenocortical insufficiency: Secondary | ICD-10-CM

## 2022-04-11 DIAGNOSIS — E039 Hypothyroidism, unspecified: Secondary | ICD-10-CM | POA: Diagnosis not present

## 2022-04-11 LAB — POCT GLYCOSYLATED HEMOGLOBIN (HGB A1C): HbA1c, POC (prediabetic range): 5.8 % (ref 5.7–6.4)

## 2022-04-11 MED ORDER — PREDNISONE 5 MG PO TABS
5.0000 mg | ORAL_TABLET | Freq: Every day | ORAL | 2 refills | Status: DC
Start: 2022-04-11 — End: 2022-07-31

## 2022-04-11 MED ORDER — LEVOTHYROXINE SODIUM 75 MCG PO TABS: 75.0000 ug | ORAL_TABLET | Freq: Every day | ORAL | 1 refills | Status: DC

## 2022-04-11 NOTE — Progress Notes (Signed)
04/11/2022, 7:20 PM   Endocrinology follow-up note  Subjective:    Patient ID: [name], female    DOB: [birth year], PCP Celene Squibb, MD   Past Medical History:  Diagnosis Date   Anemia    Anxiety    Arthritis    Asthma    Cervical radiculopathy    Cluster headaches    COPD (chronic obstructive pulmonary disease) ([place])    COPD (chronic obstructive pulmonary disease) with chronic bronchitis    GERD (gastroesophageal reflux disease)    Hyperlipemia    Hypoglycemia    Hypothyroidism    Migraine    Pneumothorax    PONV (postoperative nausea and vomiting)    Pre-diabetes    Rectal discharge 07/28/2010   Shortness of breath    Sleep apnea    cannot tolerate-not using CPAP   Sluggishness 07/28/2010   Past Surgical History:  Procedure Laterality Date   ABDOMINAL HYSTERECTOMY     with bladder suspension   CERVICAL FUSION     CHOLECYSTECTOMY     COLONOSCOPY N/A 07/30/2013   Procedure: COLONOSCOPY;  Surgeon: Rogene Houston, MD;  [facility];  Service: Endoscopy;  Laterality: N/A;  [place] OF SHOULDER Right 10/11/2016   Procedure: EXAM UNDER ANESTHESIA WITH MANIPULATION OF SHOULDER;  Surgeon: Carole Civil, MD;  Location: [facility];  Service: Orthopedics;  Laterality: Right;   HERNIA REPAIR     INCISIONAL HERNIA REPAIR N/A 07/30/2016   Procedure: HERNIA REPAIR INCISIONAL WITH MESH;  Surgeon: Aviva Signs, MD;  Location: [facility];  Service: General;  Laterality: N/A;   INTERSTIM IMPLANT PLACEMENT  02/02/2021   Procedure: [place] IMPLANT FIRST STAGE LEFT SACRUM ;  Surgeon: Cleon Gustin, MD;  Location: [facility];  Service: Urology;;   [place]rie [place]yme IMPLANT PLACEMENT N/A 02/23/2021   Procedure: [place]rie L[place] IMPLANT SECOND STAGE;  Surgeon: Cleon Gustin, MD;  Location: [facility];  Service: Urology;  Laterality: N/A;  Rep coming, time needs to stay at 2:00   [place]     right lung    POSTERIOR CERVICAL LAMINECTOMY Right 04/05/2017   Procedure: Right C6-7 Posterior cervical laminectomy;  Surgeon: Kary Kos, MD;  Location: [facility];  Service: Neurosurgery;  Laterality: Right;  Right C6-7 Posterior cervical laminectomy   SHOULDER ARTHROSCOPY WITH ROTATOR CUFF REPAIR Right 10/11/2016   Procedure: SHOULDER ARTHROSCOPY;  Surgeon: Carole Civil, MD;  Location: [facility];  Service: Orthopedics;  Laterality: Right;   TOTAL HIP ARTHROPLASTY Right 04/15/2020   Procedure: RIGHT TOTAL HIP ARTHROPLASTY ANTERIOR APPROACH;  Surgeon: Mcarthur Rossetti, MD;  Location: [facility];  Service: Orthopedics;  Laterality: Right;   TOTAL HIP ARTHROPLASTY Left 07/22/2020   Procedure: LEFT  HIP ARTHROPLASTY ANTERIOR APPROACH;  Surgeon: Mcarthur Rossetti, MD;  Location: [facility];  Service: Orthopedics;  Laterality: Left;  RNFA   TUBAL LIGATION     Social History   Socioeconomic History   Marital status: Married    Spouse name: Not on file   Number of children: Not on file   Years of education: Not on file   Highest education level: Not on file  Occupational History   Not on file  Tobacco Use   Smoking status: Former    Packs/day: 1.00    Years: 30.00    Total pack years: 30.00    Types: Cigarettes    Quit date: 02/21/2008    Years since quitting: 14.1   Smokeless tobacco: Never  Vaping Use   Vaping Use: Never used  Substance and Sexual Activity   Alcohol use: No    Alcohol/week: 0.0 standard drinks of alcohol    Comment: no   Drug use: No   Sexual activity: Not Currently    Partners: Male    Birth control/protection: Surgical  Other Topics Concern   Not on file  Social History Narrative   Not on file   Social Determinants of Health   Financial Resource Strain: Not on file  Food Insecurity: Not on file  Transportation Needs: Not on file  Physical Activity: Not on file  Stress: Not on file  Social Connections: Not on file    Family History  Problem Relation Age of Onset   Cancer Mother    COPD Mother    Diabetes Mother    Hyperlipidemia Mother    Hypertension Mother    Hyperlipidemia Father    Hypertension Father    Heart failure Maternal Grandmother    Hypertension Maternal Grandmother    Thyroid disease Maternal Grandmother    Diabetes Paternal Grandmother    Alzheimer's disease Paternal Grandmother    Heart failure Paternal Grandfather    Hypertension Paternal Grandfather    Bipolar disorder Son    Colon cancer Neg Hx    Outpatient Encounter Medications as of 04/11/2022  Medication Sig   AUVELITY 45-105 MG TBCR 1 tablet 2 (two) times daily.   acetaminophen (TYLENOL) 500 MG tablet Take 1,000 mg by mouth every 6 (six) hours as needed for moderate pain.   albuterol (PROVENTIL) (2.5 MG/3ML) 0.083% nebulizer solution Take 3 mLs (2.5 mg total) by nebulization every 6 (six) hours as needed for wheezing or shortness of breath.   albuterol (VENTOLIN HFA) 108 (90 Base) MCG/ACT inhaler Inhale 2 puffs into the lungs every 4 (four) hours as needed for shortness of breath.   Benralizumab (FASENRA PEN) 30 MG/ML SOAJ Inject 1 mL (30 mg total) into the skin every 8 (eight) weeks.   Budeson-Glycopyrrol-Formoterol (BREZTRI AEROSPHERE) 160-9-4.8 MCG/ACT AERO Inhale 2 puffs into the lungs in the morning and at bedtime.   budesonide (PULMICORT) 0.5 MG/2ML nebulizer solution Take 2 mLs (0.5 mg total) by nebulization 2 (two) times daily as needed.   cetirizine (ZYRTEC) 10 MG tablet Take 10 mg by mouth daily.   Cholecalciferol (VITAMIN D3 SUPER STRENGTH) 50 MCG (2000 UT) CAPS Take 2,000 Units by mouth daily.   gabapentin (NEURONTIN) 100 MG capsule Take by mouth.   Lancets (ONETOUCH DELICA PLUS EXHBZJ69C) MISC Apply topically.   levothyroxine (SYNTHROID) 75 MCG tablet Take 1 tablet (75 mcg total) by mouth daily before breakfast.   LORazepam (ATIVAN) 1 MG tablet Take 1.5 mg by mouth 2 (two) times daily.   montelukast  (SINGULAIR) 10 MG tablet TAKE 1 TABLET BY MOUTH AT BEDTIME   Multiple Vitamin (MULTIVITAMIN) tablet Take 1 tablet by mouth every evening.   pantoprazole (PROTONIX) 40 MG tablet Take 30- 60 min before your first and last meals of the day (Patient taking differently: Take 40 mg by mouth daily.)   pravastatin (PRAVACHOL) 40 MG tablet Take 40 mg by mouth at bedtime.   predniSONE (DELTASONE) 5 MG  tablet Take 1 tablet (5 mg total) by mouth daily with breakfast.   pregabalin (LYRICA) 100 MG capsule Take 100 mg by mouth 3 (three) times daily.   Spacer/Aero-Holding Chambers DEVI 1 each by Does not apply route daily as needed.   SUMAtriptan 6 MG/0.5ML SOAJ Inject 6 mg as directed daily as needed (migraine).    Triamcinolone Acetonide (NASACORT ALLERGY 24HR NA) Place 2 sprays into the nose daily as needed (allergies).   valACYclovir (VALTREX) 500 MG tablet Take 500-2,000 mg by mouth See admin instructions. Take 2000mg s at first sign of fever blister outbreak then take 500mg s daily until gone   [DISCONTINUED] buPROPion (WELLBUTRIN XL) 150 MG 24 hr tablet Take 300 mg by mouth daily.   [DISCONTINUED] levothyroxine (SYNTHROID) 88 MCG tablet Take 1 tablet (88 mcg total) by mouth daily before breakfast.   [DISCONTINUED] predniSONE (DELTASONE) 5 MG tablet TAKE 5 MG (1 TABLET) AND 7.5 MG (1 AND 1/2 TABLET)  ON ALTERNATE DAYS   Facility-Administered Encounter Medications as of 04/11/2022  Medication   Benralizumab SOSY 30 mg   ALLERGIES: Allergies  Allergen Reactions   Aspirin Shortness Of Breath    Causes asthma flares    Penicillins Other (See Comments)    UNSPECIFIED REACTION OF CHILDHOOD  Has patient had a PCN reaction causing immediate rash, facial/tongue/throat swelling, SOB or lightheadedness with hypotension: NO Has patient had a PCN reaction causing severe rash involving mucus membranes or skin necrosis: NO Has patient had a PCN reaction that required hospitalization: no Has patient had a PCN  reaction occurring within the last 10 years: NO If all of the above answers are "NO", then may proceed with Cephalosporin use.    Vicodin [Hydrocodone-Acetaminophen] Other (See Comments)    Headaches     VACCINATION STATUS: Immunization History  Administered Date(s) Administered   Influenza,inj,Quad PF,6+ Mos 04/13/2021   Influenza,inj,Quad PF,6-35 Mos 01/28/2020   Influenza,inj,quad, With Preservative 02/07/2022   Influenza-Unspecified 02/15/2017, 04/13/2019, 04/13/2021   PFIZER(Purple Top)SARS-COV-2 Vaccination 06/21/2019, 07/22/2019, 03/09/2020   PNEUMOCOCCAL CONJUGATE-20 04/13/2021    HPI [name] is 57 y.o. female who presents today with a medical history as above. she is being seen in follow-up after she was seen in consultation for steroid induced adrenal suppression requested by Celene Squibb, MD.   See notes from her previous visit.  She has been treated with prednisone more or less continuously for the last 15 years.  There are times she was taking 20 mg of prednisone daily, mostly after 10mg  daily.  After diagnosis with adrenal insufficiency, she was advised to take alternating doses of prednisone 7.5 mg p.o. every other day and 5 mg p.o. every other day.   She reports that mostly she has been taking 5 mg p.o. daily since last visit.  She has no new complaints today. She denies any history of adrenal crisis.  She has history of heavy smoking, complicated by COPD on ongoing inhalers including albuterol, budesonide, and Perforomist.  She is also on oxygen supplement 24/7. She denies any history of adrenal injury, penetrating abdominal trauma.  Her other medical problems include hypothyroidism on levothyroxine 88 mcg p.o. daily, hyperlipidemia, prediabetes.  Her previsit thyroid function tests are consistent with appropriate repla  Her most recent bone density from March 25, 2019 was consistent with osteopenia of the spine, normal bone density on the left forearm radius.     Review of Systems  Constitutional: + Gained 7 pounds since last visit,   + fatigue, no subjective  hyperthermia, no subjective hypothermia Eyes: no blurry vision, no xerophthalmia ENT: no sore throat, no nodules palpated in throat, no dysphagia/odynophagia, no hoarseness Cardiovascular: no Chest Pain, + Shortness of Breath on continuous oxygen supplement, no palpitations, no leg swelling Respiratory: + On continuous oxygen supplement.   Status post partial pneumonectomy in 2004.   Objective:       04/11/2022    8:52 AM 03/30/2022    8:56 AM 01/31/2022    8:42 AM  Vitals with BMI  Height 5\' 4"  5\' 4"    Weight 148 lbs 149 lbs 10 oz   BMI 123456 123456   Systolic 99991111 [id number]  Diastolic 66 68 78  Pulse 92 88 105    BP 114/66   Pulse 92   Ht 5\' 4"  (1.626 m)   Wt 148 lb (67.1 kg)   BMI 25.40 kg/m   Wt Readings from Last 3 Encounters:  04/11/22 148 lb (67.1 kg)  03/30/22 149 lb 9.6 oz (67.9 kg)  12/01/21 154 lb 3.2 oz (69.9 kg)    Physical Exam  Constitutional:  Body mass index is 25.4 kg/m.,  not in acute distress, normal state of mind Eyes: PERRLA, EOMI, no exophthalmos ENT: moist mucous membranes, no gross thyromegaly, no gross cervical lymphadenopathy   CMP ( most recent) CMP     Component Value Date/Time   NA 141 02/03/2022 0000   K 4.6 02/03/2022 0000   CL 102 02/03/2022 0000   CO2 23 (A) 02/03/2022 0000   GLUCOSE 104 (H) 10/30/2021 0958   BUN 13 02/03/2022 0000   CREATININE 1.1 02/03/2022 0000   CREATININE 1.07 (H) 10/30/2021 0958   CALCIUM 9.8 02/03/2022 0000   PROT 6.5 09/25/2021 0754   ALBUMIN 4.5 02/03/2022 0000   ALBUMIN 4.3 09/25/2021 0754   AST 19 02/03/2022 0000   ALT 20 02/03/2022 0000   ALKPHOS 76 02/03/2022 0000   BILITOT 0.2 09/25/2021 0754   GFRNONAA >60 10/30/2021 0958   GFRAA >60 03/06/2019 0931     Diabetic Labs (most recent): Lab Results  Component Value Date   HGBA1C 5.8 04/11/2022   HGBA1C 5.8 02/03/2022   HGBA1C 5.8  10/05/2021     Lab Results  Component Value Date   TSH 0.129 (L) 04/05/2022   TSH 0.55 02/03/2022   TSH 7.410 (H) 09/25/2021   TSH 3.21 05/21/2006   FREET4 1.48 04/05/2022   FREET4 1.03 09/25/2021     Lipid Panel     Component Value Date/Time   CHOL 187 02/03/2022 0000   CHOL 201 (H) 09/25/2021 0754   TRIG 155 02/03/2022 0000   HDL 59 02/03/2022 0000   HDL 65 09/25/2021 0754   CHOLHDL 3.1 09/25/2021 0754   LDLCALC 101 02/03/2022 0000   LDLCALC 115 (H) 09/25/2021 0754   LABVLDL 21 09/25/2021 0754    Assessment & Plan:   1.  Steroid induced adrenal suppression   2.  Hypothyroidism   3.  Prediabetes   - I have reviewed her recent and available endocrine records and clinically evaluated the patient. - Based on these reviews, she has adrenal suppression due to chronic steroid treatment related to her COPD/asthma.  This patient will likely need lifelong replacement dose steroids, glucocorticoids for now.  She is advised to continue prednisone 5 mg p.o. daily at breakfast.     The minimum amount of prednisone she should be on is 5 mg p.o. daily.  Her blood pressure is stable at 114/66 today, if she develops hypotension,  she will be considered for Florinef on subsequent visits.    It is unsafe, unnecessary to withdraw prednisone to test her for adrenal insufficiency.  I discussed and recommended medical alert for her to wear as a wrist lace or necklace depicting her adrenal insufficiency and the need for steroids.   She has point-of-care A1c is at 5.8% today, still consistent with prediabetes.   She will not need medication intervention for this at this time.     Regarding her hypothyroidism, the circumstance of diagnosis not available to review.  Her previsit labs show evidence of slight over-replacement.  I discussed and lowered her levothyroxine to 75 mcg p.o. daily before breakfast.     - We discussed about the correct intake of her thyroid hormone, on empty stomach at  fasting, with water, separated by at least 30 minutes from breakfast and other medications,  and separated by more than 4 hours from calcium, iron, multivitamins, acid reflux medications (PPIs). -Patient is made aware of the fact that thyroid hormone replacement is needed for life, dose to be adjusted by periodic monitoring of thyroid function tests.   Regarding her dyslipidemia: ALT is uncontrolled with LDL 101, dropping from 115.  She will continue to benefit from statin intervention.  I advised her to continue Pravachol 40 mg p.o. nightly.  Side effects and precautions discussed with her.     - she is advised to maintain close follow up with Celene Squibb, MD for primary care needs.   I spent 27 minutes in the care of the patient today including review of labs from Thyroid Function, CMP, and other relevant labs ; imaging/biopsy records (current and previous including abstractions from other facilities); face-to-face time discussing  her lab results and symptoms, medications doses, her options of short and long term treatment based on the latest standards of care / guidelines;   and documenting the encounter.  Samantha Crimes  participated in the discussions, expressed understanding, and voiced agreement with the above plans.  All questions were answered to her satisfaction. she is encouraged to contact clinic should she have any questions or concerns prior to her return visit.    Follow up plan: Return in about 6 months (around 10/10/2022) for Fasting Labs  in AM B4 8, A1c -[place].   Glade Lloyd, MD [facility] [address] [place], [place] [address] Phone: [phone]  Fax: [phone]     04/11/2022, 7:20 PM  This note was partially dictated with voice recognition software. Similar sounding words can be transcribed inadequately or may not  be corrected upon review.

## 2022-04-18 ENCOUNTER — Other Ambulatory Visit (HOSPITAL_COMMUNITY): Payer: Self-pay

## 2022-05-11 ENCOUNTER — Other Ambulatory Visit (HOSPITAL_COMMUNITY): Payer: Self-pay

## 2022-05-14 ENCOUNTER — Other Ambulatory Visit (HOSPITAL_COMMUNITY): Payer: Self-pay | Admitting: Internal Medicine

## 2022-05-14 ENCOUNTER — Other Ambulatory Visit (HOSPITAL_COMMUNITY): Payer: Self-pay

## 2022-05-14 DIAGNOSIS — R269 Unspecified abnormalities of gait and mobility: Secondary | ICD-10-CM

## 2022-05-21 ENCOUNTER — Other Ambulatory Visit (HOSPITAL_COMMUNITY): Payer: Self-pay

## 2022-05-21 ENCOUNTER — Ambulatory Visit (INDEPENDENT_AMBULATORY_CARE_PROVIDER_SITE_OTHER): Payer: Medicare Other

## 2022-05-21 DIAGNOSIS — J4550 Severe persistent asthma, uncomplicated: Secondary | ICD-10-CM

## 2022-05-21 NOTE — Progress Notes (Signed)
Immunotherapy   Patient Details  Name: [name] MRN: [id number] Date of Birth: 12-08-1965  05/21/2022  Samantha Crimes started injections for  [place] home administration. Following schedule: Every sixty days  Frequency:Every eight weeks.  Epi-Pen: Not needed.  Consent signed previously and patient instructions given on how to inject into the abdomen, arms, and thigh. Patient and her husband waited in the lobby for thirty minutes without an issue.    Julius Bowels 05/21/2022, 8:53 AM

## 2022-05-22 ENCOUNTER — Ambulatory Visit (HOSPITAL_COMMUNITY)
Admission: RE | Admit: 2022-05-22 | Discharge: 2022-05-22 | Disposition: A | Discharge status: Home / Self Care | Payer: Medicare Other | Source: Ambulatory Visit | Attending: Internal Medicine | Admitting: Internal Medicine

## 2022-05-22 DIAGNOSIS — R269 Unspecified abnormalities of gait and mobility: Secondary | ICD-10-CM | POA: Insufficient documentation

## 2022-05-28 ENCOUNTER — Encounter: Payer: Self-pay | Admitting: Neurology

## 2022-05-28 ENCOUNTER — Ambulatory Visit (INDEPENDENT_AMBULATORY_CARE_PROVIDER_SITE_OTHER): Payer: Medicare Other | Admitting: Neurology

## 2022-05-28 VITALS — BP 115/67 | HR 79 | Ht 64.6 in | Wt 141.5 lb

## 2022-05-28 DIAGNOSIS — R269 Unspecified abnormalities of gait and mobility: Secondary | ICD-10-CM | POA: Diagnosis not present

## 2022-05-28 NOTE — Progress Notes (Signed)
Chief Complaint  Patient presents with   New Patient (Initial Visit)    Rm 14. Accompanied by husband. NP/Paper Proficient/John Merlyn Albert MD/abnormal gait.      ASSESSMENT AND PLAN  [name] is a 57 y.o. female   Sudden worsening gait abnormality since April 12, 2022  Multiple vascular risk factor, sedentary lifestyle, COPD, oxygen dependent, hyperlipidemia,  On examination showed profound hyperreflexia, bilateral Babinski signs,  MRI of the brain to rule out stroke, cervical spine for concern of cervical myelopathy  Referred to physical therapy  Return to clinic in 2 to 3 months,   DIAGNOSTIC DATA (LABS, IMAGING, TESTING) - I reviewed patient records, labs, notes, testing and imaging myself where available.   MEDICAL HISTORY:  [name] is a 57 year old female, accompanied by her husband, seen in request by her primary care from [facility], Dr. Nevada Crane, Jenny Reichmann for evaluation of gait abnormality, initial evaluation was on May 28, 2022  I reviewed and summarized the referring note. PMHX. COPD, home oxygen dependent April 2020. Asthma Smoke, quit Nov 2009. Hypothyroidism History of bilateral Hip replacement HLD History of cervical decompression fusion, in 2018, Right cervical radiculopathy.  She had a history of cervical decompression in 2018 for right cervical radiculopathy by Dr. Saintclair Halsted, denied gait abnormality at that time  She suffered multiple medical condition, has been on disability due to depression anxiety, COPD, oxygen dependent, bilateral hip replacement, already has mild gait abnormality, but ambulate without assistance  She also had slow worsening urinary incontinence, had bladder stimulator placed in 2022  On April 12, 2022, she woke up with profound gait abnormality, she has to hold onto walls, or her husband to take a few steps, tends to lean towards the left side,  CT head without contrast on May 22, 2022 showed no acute  abnormality, chronic small vessel disease, bilateral frontal atrophy,   PHYSICAL EXAM:   Vitals:   05/28/22 1025  BP: 115/67  Pulse: 79  Weight: 141 lb 8 oz (64.2 kg)  Height: 5' 4.6" (1.641 m)   Body mass index is 23.84 kg/m.  PHYSICAL EXAMNIATION:  Gen: NAD, conversant, well nourised, well groomed                     Cardiovascular: Regular rate rhythm, no peripheral edema, warm, nontender. Eyes: Conjunctivae clear without exudates or hemorrhage Neck: Supple, no carotid bruits. Pulmonary: Clear to auscultation bilaterally   NEUROLOGICAL EXAM:  MENTAL STATUS: Speech/cognition: Anxious looking middle-age female, awake, alert, oriented to history taking and casual conversation CRANIAL NERVES: CN II: Visual fields are full to confrontation. Pupils are round equal and briskly reactive to light. CN III, IV, VI: extraocular movement are normal. No ptosis. CN V: Facial sensation is intact to light touch CN VII: Face is symmetric with normal eye closure  CN VIII: Hearing is normal to causal conversation. CN IX, X: Phonation is normal. CN XI: Head turning and shoulder shrug are intact  MOTOR: Fixation of left upper extremity on rapid rotating movement, no significant bilateral lower extremity weakness  REFLEXES: Reflexes are 3 and symmetric at the biceps, triceps, knees, and ankles. Plantar responses are extensor bilaterally  SENSORY: Intact to light touch, pinprick and vibratory sensation are intact in fingers and toes.  COORDINATION: There is no trunk or limb dysmetria noted.  GAIT/STANCE: Multiple effort to get up from seated position arm crossed, stiff, unsteady, need to rely on assistant  REVIEW OF SYSTEMS:  Full 14 system review of  systems performed and notable only for as above All other review of systems were negative.   ALLERGIES: Allergies  Allergen Reactions   Aspirin Shortness Of Breath    Causes asthma flares    Penicillins Other (See Comments)     UNSPECIFIED REACTION OF CHILDHOOD  Has patient had a PCN reaction causing immediate rash, facial/tongue/throat swelling, SOB or lightheadedness with hypotension: NO Has patient had a PCN reaction causing severe rash involving mucus membranes or skin necrosis: NO Has patient had a PCN reaction that required hospitalization: no Has patient had a PCN reaction occurring within the last 10 years: NO If all of the above answers are "NO", then may proceed with Cephalosporin use.    Vicodin [Hydrocodone-Acetaminophen] Other (See Comments)    Headaches     HOME MEDICATIONS: Current Outpatient Medications  Medication Sig Dispense Refill   acetaminophen (TYLENOL) 500 MG tablet Take 1,000 mg by mouth every 6 (six) hours as needed for moderate pain.     albuterol (PROVENTIL) (2.5 MG/3ML) 0.083% nebulizer solution Take 3 mLs (2.5 mg total) by nebulization every 6 (six) hours as needed for wheezing or shortness of breath. 75 mL 1   albuterol (VENTOLIN HFA) 108 (90 Base) MCG/ACT inhaler Inhale 2 puffs into the lungs every 4 (four) hours as needed for shortness of breath. 3 each 1   Benralizumab (FASENRA PEN) 30 MG/ML SOAJ Inject 1 mL (30 mg total) into the skin every 8 (eight) weeks. 1 mL 11   Budeson-Glycopyrrol-Formoterol (BREZTRI AEROSPHERE) 160-9-4.8 MCG/ACT AERO Inhale 2 puffs into the lungs in the morning and at bedtime. 1 each 12   budesonide (PULMICORT) 0.5 MG/2ML nebulizer solution Take 2 mLs (0.5 mg total) by nebulization 2 (two) times daily as needed. 2 mL 5   cetirizine (ZYRTEC) 10 MG tablet Take 10 mg by mouth daily.     Cholecalciferol (VITAMIN D3 SUPER STRENGTH PO) Take 5,000 Units by mouth daily.     gabapentin (NEURONTIN) 100 MG capsule Take by mouth.     Lancets (ONETOUCH DELICA PLUS 123XX123) MISC Apply topically.     levothyroxine (SYNTHROID) 75 MCG tablet Take 1 tablet (75 mcg total) by mouth daily before breakfast. (Patient taking differently: Take 50 mcg by mouth daily before  breakfast.) 90 tablet 1   LORazepam (ATIVAN) 1 MG tablet Take 1.5 mg by mouth 2 (two) times daily.     montelukast (SINGULAIR) 10 MG tablet TAKE 1 TABLET BY MOUTH AT BEDTIME 90 tablet 1   pantoprazole (PROTONIX) 40 MG tablet Take 30- 60 min before your first and last meals of the day (Patient taking differently: Take 40 mg by mouth daily.)     pravastatin (PRAVACHOL) 40 MG tablet Take 40 mg by mouth at bedtime.     predniSONE (DELTASONE) 5 MG tablet Take 1 tablet (5 mg total) by mouth daily with breakfast. 100 tablet 2   pregabalin (LYRICA) 75 MG capsule Take 100 mg by mouth 2 (two) times daily.     Spacer/Aero-Holding Chambers DEVI 1 each by Does not apply route daily as needed. 1 each 2   SUMAtriptan 6 MG/0.5ML SOAJ Inject 6 mg as directed daily as needed (migraine).      Triamcinolone Acetonide (NASACORT ALLERGY 24HR NA) Place 2 sprays into the nose daily as needed (allergies).     valACYclovir (VALTREX) 500 MG tablet Take 500-2,000 mg by mouth See admin instructions. Take 2044ms at first sign of fever blister outbreak then take 5042m daily until gone  Current Facility-Administered Medications  Medication Dose Route Frequency Provider Last Rate Last Admin   Benralizumab SOSY 30 mg  30 mg Subcutaneous Q8 Toney Reil, MD   30 mg at 05/21/22 [id number]    PAST MEDICAL HISTORY: Past Medical History:  Diagnosis Date   Anemia    Anxiety    Arthritis    Asthma    Cervical radiculopathy    Cluster headaches    COPD (chronic obstructive pulmonary disease) (HCC)    COPD (chronic obstructive pulmonary disease) with chronic bronchitis    GERD (gastroesophageal reflux disease)    Hyperlipemia    Hypoglycemia    Hypothyroidism    Migraine    Pneumothorax    PONV (postoperative nausea and vomiting)    Pre-diabetes    Rectal discharge 07/28/2010   Shortness of breath    Sleep apnea    cannot tolerate-not using CPAP   Sluggishness 07/28/2010    PAST SURGICAL HISTORY: Past  Surgical History:  Procedure Laterality Date   ABDOMINAL HYSTERECTOMY     with bladder suspension   CERVICAL FUSION     CHOLECYSTECTOMY     COLONOSCOPY N/A 07/30/2013   Procedure: COLONOSCOPY;  Surgeon: Rogene Houston, MD;  Location: [facility];  Service: Endoscopy;  Laterality: N/A;  [place] OF SHOULDER Right 10/11/2016   Procedure: EXAM UNDER ANESTHESIA WITH MANIPULATION OF SHOULDER;  Surgeon: Carole Civil, MD;  Location: [facility];  Service: Orthopedics;  Laterality: Right;   HERNIA REPAIR     INCISIONAL HERNIA REPAIR N/A 07/30/2016   Procedure: HERNIA REPAIR INCISIONAL WITH MESH;  Surgeon: Aviva Signs, MD;  Location: [facility];  Service: General;  Laterality: N/A;   INTERSTIM IMPLANT PLACEMENT  02/02/2021   Procedure: [place]rie [place] IMPLANT FIRST STAGE LEFT SACRUM ;  Surgeon: Cleon Gustin, MD;  Location: [facility];  Service: Urology;;   Barrie Lyme IMPLANT PLACEMENT N/A 02/23/2021   Procedure: Barrie Lyme IMPLANT SECOND STAGE;  Surgeon: Cleon Gustin, MD;  Location: [facility];  Service: Urology;  Laterality: N/A;  Rep coming, time needs to stay at 2:00   [place]     right lung    POSTERIOR CERVICAL LAMINECTOMY Right 04/05/2017   Procedure: Right C6-7 Posterior cervical laminectomy;  Surgeon: Kary Kos, MD;  Location: [facility];  Service: Neurosurgery;  Laterality: Right;  Right C6-7 Posterior cervical laminectomy   SHOULDER ARTHROSCOPY WITH ROTATOR CUFF REPAIR Right 10/11/2016   Procedure: SHOULDER ARTHROSCOPY;  Surgeon: Carole Civil, MD;  Location: [facility];  Service: Orthopedics;  Laterality: Right;   TOTAL HIP ARTHROPLASTY Right 04/15/2020   Procedure: RIGHT TOTAL HIP ARTHROPLASTY ANTERIOR APPROACH;  Surgeon: Mcarthur Rossetti, MD;  Location: [facility];  Service: Orthopedics;  Laterality: Right;   TOTAL HIP ARTHROPLASTY Left 07/22/2020   Procedure: LEFT  HIP ARTHROPLASTY ANTERIOR APPROACH;  Surgeon: Mcarthur Rossetti, MD;  Location: [facility];  Service: Orthopedics;  Laterality: Left;  RNFA   TUBAL LIGATION      FAMILY HISTORY: Family History  Problem Relation Age of Onset   Cancer Mother    COPD Mother    Diabetes Mother    Hyperlipidemia Mother    Hypertension Mother    Hyperlipidemia Father    Hypertension Father    Heart failure Maternal Grandmother    Hypertension Maternal Grandmother    Thyroid disease Maternal Grandmother    Diabetes Paternal Grandmother    Alzheimer's disease Paternal  Grandmother    Heart failure Paternal Grandfather    Hypertension Paternal Grandfather    Bipolar disorder Son    Colon cancer Neg Hx     SOCIAL HISTORY: Social History   Socioeconomic History   Marital status: Married    Spouse name: Not on file   Number of children: Not on file   Years of education: Not on file   Highest education level: Not on file  Occupational History   Not on file  Tobacco Use   Smoking status: Former    Packs/day: 1.00    Years: 30.00    Total pack years: 30.00    Types: Cigarettes    Quit date: 02/21/2008    Years since quitting: 14.2   Smokeless tobacco: Never  Vaping Use   Vaping Use: Never used  Substance and Sexual Activity   Alcohol use: No    Alcohol/week: 0.0 standard drinks of alcohol    Comment: no   Drug use: No   Sexual activity: Not Currently    Partners: Male    Birth control/protection: Surgical  Other Topics Concern   Not on file  Social History Narrative   Not on file   Social Determinants of Health   Financial Resource Strain: Not on file  Food Insecurity: Not on file  Transportation Needs: Not on file  Physical Activity: Not on file  Stress: Not on file  Social Connections: Not on file  Intimate Partner Violence: Not on file      Marcial Pacas, M.D. Ph.D.  [facility] [address], [place], [place] [address] Ph: [phone] Fax: [phone]  CC:  Celene Squibb, MD [address] [place],  [place] [address]  Celene Squibb, MD

## 2022-05-31 ENCOUNTER — Telehealth: Payer: Self-pay | Admitting: Neurology

## 2022-05-31 NOTE — Telephone Encounter (Signed)
[employer] medicare Josem Kaufmann: [id number] exp. 05/31/22-06/29/22, [place] medicaid NPR sent to [facility] [phone]

## 2022-06-06 ENCOUNTER — Ambulatory Visit (INDEPENDENT_AMBULATORY_CARE_PROVIDER_SITE_OTHER): Payer: Medicare Other | Admitting: Family Medicine

## 2022-06-06 ENCOUNTER — Other Ambulatory Visit: Payer: Self-pay

## 2022-06-06 ENCOUNTER — Encounter: Payer: Self-pay | Admitting: Family Medicine

## 2022-06-06 ENCOUNTER — Ambulatory Visit (HOSPITAL_COMMUNITY)
Admission: RE | Admit: 2022-06-06 | Discharge: 2022-06-06 | Disposition: A | Discharge status: Home / Self Care | Payer: Medicare Other | Source: Ambulatory Visit | Attending: Urology | Admitting: Urology

## 2022-06-06 ENCOUNTER — Ambulatory Visit (INDEPENDENT_AMBULATORY_CARE_PROVIDER_SITE_OTHER): Payer: Medicare Other | Admitting: Urology

## 2022-06-06 VITALS — BP 99/58 | HR 86

## 2022-06-06 VITALS — BP 118/62 | HR 87 | Temp 98.1°F | Resp 20 | Ht 64.0 in | Wt 143.4 lb

## 2022-06-06 DIAGNOSIS — J302 Other seasonal allergic rhinitis: Secondary | ICD-10-CM

## 2022-06-06 DIAGNOSIS — R32 Unspecified urinary incontinence: Secondary | ICD-10-CM | POA: Diagnosis not present

## 2022-06-06 DIAGNOSIS — J4489 Other specified chronic obstructive pulmonary disease: Secondary | ICD-10-CM

## 2022-06-06 DIAGNOSIS — J4550 Severe persistent asthma, uncomplicated: Secondary | ICD-10-CM

## 2022-06-06 DIAGNOSIS — F1920 Other psychoactive substance dependence, uncomplicated: Secondary | ICD-10-CM | POA: Diagnosis not present

## 2022-06-06 DIAGNOSIS — Z9981 Dependence on supplemental oxygen: Secondary | ICD-10-CM

## 2022-06-06 DIAGNOSIS — J3089 Other allergic rhinitis: Secondary | ICD-10-CM

## 2022-06-06 MED ORDER — ALBUTEROL SULFATE HFA 108 (90 BASE) MCG/ACT IN AERS
2.0000 | INHALATION_SPRAY | RESPIRATORY_TRACT | 2 refills | Status: DC | PRN
Start: 2022-06-06 — End: 2023-04-17

## 2022-06-06 MED ORDER — MONTELUKAST SODIUM 10 MG PO TABS: 10.0000 mg | ORAL_TABLET | Freq: Every day | ORAL | 1 refills | Status: DC

## 2022-06-06 NOTE — Progress Notes (Signed)
Catheter Removal  Patient is present today for a catheter removal.  33m of water was drained from the balloon. A 16FR foley cath was removed from the bladder, no complications were noted. Patient tolerated well.  Performed by: KLevi Aland CMA  Follow up/ Additional notes: Follow up as scheduled.

## 2022-06-06 NOTE — Progress Notes (Unsigned)
[place], [address] [place] [address] Dept: [phone]  FOLLOW UP NOTE  Patient ID: [name], female    DOB: 1965/11/08  Age: 57 y.o. MRN: [id number] Date of Office Visit: 06/06/2022  Assessment  Chief Complaint: Follow-up  HPI [name] is a 57 year old female who presents to the clinic for follow-up visit.  She was last seen in this clinic on 01/31/2022 by Dr. Ernst Bowler for evaluation of asthma COPD overlap syndrome and allergic rhinitis.  At today's visit, she reports her asthma has been moderately well controlled with symptoms including occasional shortness of breath with activity, occasional wheeze, and intermittent dry cough. She continues Breztri 2 puffs twice a day, montelukast 10 mg once a day and uses her albuterol intermittently with relief of symptoms. She reports that she has needed to use budesonide    Drug Allergies:  Allergies  Allergen Reactions   Aspirin Shortness Of Breath    Causes asthma flares    Penicillins Other (See Comments)    UNSPECIFIED REACTION OF CHILDHOOD  Has patient had a PCN reaction causing immediate rash, facial/tongue/throat swelling, SOB or lightheadedness with hypotension: NO Has patient had a PCN reaction causing severe rash involving mucus membranes or skin necrosis: NO Has patient had a PCN reaction that required hospitalization: no Has patient had a PCN reaction occurring within the last 10 years: NO If all of the above answers are "NO", then may proceed with Cephalosporin use.    Vicodin [Hydrocodone-Acetaminophen] Other (See Comments)    Headaches     Physical Exam: BP 118/62   Pulse 87   Temp 98.1 F (36.7 C)   Resp 20   Ht '5\' 4"'$  (1.626 m)   Wt 143 lb 6.4 oz (65 kg)   SpO2 97%   BMI 24.61 kg/m    Physical Exam  Diagnostics: FVC 1.78, FEV1 1.46.  Predicted FVC 3.21, predicted FEV1 2.54.  Spirometry indicates restriction.  This is consistent with previous spirometry  readings.  Assessment and Plan: 1. Severe persistent asthma without complication   2. Oxygen dependent   3. Asthma-COPD overlap syndrome   4. Steroid dependent ([place])   5. Seasonal and perennial allergic rhinitis     No orders of the defined types were placed in this encounter.   Patient Instructions  Asthma COPD overlap syndrome Continue montelukast 10 mg once a day to prevent cough or wheeze Continue Breztri 2 puffs twice a day with a spacer to prevent cough or wheeze Continue prednisone 5 mg to 7.5 mg once a day.  Continue to follow-up with Dr. Dorris Fetch for further dosing of prednisone Continue albuterol 2 puffs every 4 hours as needed for cough or wheeze OR Instead use albuterol 0.083% solution via nebulizer one unit vial every 4 hours as needed for cough or wheeze You may use albuterol 2 puffs 5 to 15 minutes before activity to decrease cough or wheeze For asthma flare, begin budesonide 0.5 mg via nebulizer twice a day for 1 to 2 weeks or until cough and wheeze free Continue Fasenra injections once every 8 weeks Continue oxygen at 3 L nasal cannula as previously prescribed  Allergic rhinitis Continue allergen avoidance measures directed toward horse, tobacco leaf, grass pollen, ragweed pollen, tree pollen, mold, dust mite, cat, and dog as listed below Continue cetirizine 10 mg once a day as needed for runny nose or itch Continue Nasacort 2 sprays in each nostril once a day as needed for stuffy nose Consider saline nasal rinses  as needed for nasal symptoms. Use this before any medicated nasal sprays for best result  Call the clinic if this treatment plan is not working well for you.  Follow up in 6 months or sooner if needed.  Reducing Pollen Exposure The American Academy of Allergy, Asthma and Immunology suggests the following steps to reduce your exposure to pollen during allergy seasons. Do not hang sheets or clothing out to dry; pollen may collect on these items. Do not mow  lawns or spend time around freshly cut grass; mowing stirs up pollen. Keep windows closed at night.  Keep car windows closed while driving. Minimize morning activities outdoors, a time when pollen counts are usually at their highest. Stay indoors as much as possible when pollen counts or humidity is high and on windy days when pollen tends to remain in the air longer. Use air conditioning when possible.  Many air conditioners have filters that trap the pollen spores. Use a HEPA room air filter to remove pollen form the indoor air you breathe.  Control of Mold Allergen Mold and fungi can grow on a variety of surfaces provided certain temperature and moisture conditions exist.  Outdoor molds grow on plants, decaying vegetation and soil.  The major outdoor mold, Alternaria and Cladosporium, are found in very high numbers during hot and dry conditions.  Generally, a late Summer - Fall peak is seen for common outdoor fungal spores.  Rain will temporarily lower outdoor mold spore count, but counts rise rapidly when the rainy period ends.  The most important indoor molds are Aspergillus and Penicillium.  Dark, humid and poorly ventilated basements are ideal sites for mold growth.  The next most common sites of mold growth are the bathroom and the kitchen.  Outdoor [employer] Use air conditioning and keep windows closed Avoid exposure to decaying vegetation. Avoid leaf raking. Avoid grain handling. Consider wearing a face mask if working in moldy areas.  Indoor Mold Control Maintain humidity below 50%. Clean washable surfaces with 5% bleach solution. Remove sources e.g. Contaminated carpets.   Control of Dust Mite Allergen Dust mites play a major role in allergic asthma and rhinitis. They occur in environments with high humidity wherever human skin is found. Dust mites absorb humidity from the atmosphere (ie, they do not drink) and feed on organic matter (including shed human and animal skin). Dust  mites are a microscopic type of insect that you cannot see with the naked eye. High levels of dust mites have been detected from mattresses, pillows, carpets, upholstered furniture, bed covers, clothes, soft toys and any woven material. The principal allergen of the dust mite is found in its feces. A gram of dust may contain 1,000 mites and 250,000 fecal particles. Mite antigen is easily measured in the air during house cleaning activities. Dust mites do not bite and do not cause harm to humans, other than by triggering allergies/asthma.  Ways to decrease your exposure to dust mites in your home:  1. Encase mattresses, box springs and pillows with a mite-impermeable barrier or cover  2. Wash sheets, blankets and drapes weekly in hot water (130 F) with detergent and dry them in a dryer on the hot setting.  3. Have the room cleaned frequently with a vacuum cleaner and a damp dust-mop. For carpeting or rugs, vacuuming with a vacuum cleaner equipped with a high-efficiency particulate air (HEPA) filter. The dust mite allergic individual should not be in a room which is being cleaned and should wait 1  hour after cleaning before going into the room.  4. Do not sleep on upholstered furniture (eg, couches).  5. If possible removing carpeting, upholstered furniture and drapery from the home is ideal. Horizontal blinds should be eliminated in the rooms where the person spends the most time (bedroom, study, television room). Washable vinyl, roller-type shades are optimal.  6. Remove all non-washable stuffed toys from the bedroom. Wash stuffed toys weekly like sheets and blankets above.  7. Reduce indoor humidity to less than 50%. Inexpensive humidity monitors can be purchased at most hardware stores. Do not use a humidifier as can make the problem worse and are not recommended.  Control of Dog or Cat Allergen Avoidance is the best way to manage a dog or cat allergy. If you have a dog or cat and are allergic  to dog or cats, consider removing the dog or cat from the home. If you have a dog or cat but don't want to find it a new home, or if your family wants a pet even though someone in the household is allergic, here are some strategies that may help keep symptoms at bay:  Keep the pet out of your bedroom and restrict it to only a few rooms. Be advised that keeping the dog or cat in only one room will not limit the allergens to that room. Don't pet, hug or kiss the dog or cat; if you do, wash your hands with soap and water. High-efficiency particulate air (HEPA) cleaners run continuously in a bedroom or living room can reduce allergen levels over time. Regular use of a high-efficiency vacuum cleaner or a central vacuum can reduce allergen levels. Giving your dog or cat a bath at least once a week can reduce airborne allergen.   Control of Dust Mite Allergen Dust mites play a major role in allergic asthma and rhinitis. They occur in environments with high humidity wherever human skin is found. Dust mites absorb humidity from the atmosphere (ie, they do not drink) and feed on organic matter (including shed human and animal skin). Dust mites are a microscopic type of insect that you cannot see with the naked eye. High levels of dust mites have been detected from mattresses, pillows, carpets, upholstered furniture, bed covers, clothes, soft toys and any woven material. The principal allergen of the dust mite is found in its feces. A gram of dust may contain 1,000 mites and 250,000 fecal particles. Mite antigen is easily measured in the air during house cleaning activities. Dust mites do not bite and do not cause harm to humans, other than by triggering allergies/asthma.  Ways to decrease your exposure to dust mites in your home:  1. Encase mattresses, box springs and pillows with a mite-impermeable barrier or cover  2. Wash sheets, blankets and drapes weekly in hot water (130 F) with detergent and dry them  in a dryer on the hot setting.  3. Have the room cleaned frequently with a vacuum cleaner and a damp dust-mop. For carpeting or rugs, vacuuming with a vacuum cleaner equipped with a high-efficiency particulate air (HEPA) filter. The dust mite allergic individual should not be in a room which is being cleaned and should wait 1 hour after cleaning before going into the room.  4. Do not sleep on upholstered furniture (eg, couches).  5. If possible removing carpeting, upholstered furniture and drapery from the home is ideal. Horizontal blinds should be eliminated in the rooms where the person spends the most time (bedroom, study, television  room). Washable vinyl, roller-type shades are optimal.  6. Remove all non-washable stuffed toys from the bedroom. Wash stuffed toys weekly like sheets and blankets above.  7. Reduce indoor humidity to less than 50%. Inexpensive humidity monitors can be purchased at most hardware stores. Do not use a humidifier as can make the problem worse and are not recommended.   No follow-ups on file.    Thank you for the opportunity to care for this patient.  Please do not hesitate to contact me with questions.  Gareth Morgan, FNP [facility]

## 2022-06-06 NOTE — Progress Notes (Unsigned)
06/06/2022 9:11 AM   [name] 1966-01-13 [id number]  Referring provider: Celene Squibb, MD [place],  [place] [address]  Followup OAB   HPI: [name] is a 57yo here for followup for OAB. She was doing well until 2 months ago when she developed pain with the device on and the pain radiates down her leg. She gets a shocked by the device. She has fallen twice in the past couple months. She gets no warning for her incontinence. KUb from today shows the lead has been advanced 1cm into the S3 foramen.   PMH: Past Medical History:  Diagnosis Date   Anemia    Anxiety    Arthritis    Asthma    Cervical radiculopathy    Cluster headaches    COPD (chronic obstructive pulmonary disease) (HCC)    COPD (chronic obstructive pulmonary disease) with chronic bronchitis    GERD (gastroesophageal reflux disease)    Hyperlipemia    Hypoglycemia    Hypothyroidism    Migraine    Pneumothorax    PONV (postoperative nausea and vomiting)    Pre-diabetes    Rectal discharge 07/28/2010   Shortness of breath    Sleep apnea    cannot tolerate-not using CPAP   Sluggishness 07/28/2010    Surgical History: Past Surgical History:  Procedure Laterality Date   ABDOMINAL HYSTERECTOMY     with bladder suspension   CERVICAL FUSION     CHOLECYSTECTOMY     COLONOSCOPY N/A 07/30/2013   Procedure: COLONOSCOPY;  Surgeon: Rogene Houston, MD;  [facility];  Service: Endoscopy;  Laterality: N/A;  [place] OF SHOULDER Right 10/11/2016   Procedure: EXAM UNDER ANESTHESIA WITH MANIPULATION OF SHOULDER;  Surgeon: Carole Civil, MD;  Location: [facility];  Service: Orthopedics;  Laterality: Right;   HERNIA REPAIR     INCISIONAL HERNIA REPAIR N/A 07/30/2016   Procedure: HERNIA REPAIR INCISIONAL WITH MESH;  Surgeon: Aviva Signs, MD;  Location: [facility];  Service: General;  Laterality: N/A;   INTERSTIM IMPLANT PLACEMENT  02/02/2021    Procedure: [place] IMPLANT FIRST STAGE LEFT SACRUM ;  Surgeon: Cleon Gustin, MD;  Location: [facility];  Service: Urology;;   Barrie Lyme IMPLANT PLACEMENT N/A 02/23/2021   Procedure: Barrie Lyme IMPLANT SECOND STAGE;  Surgeon: Cleon Gustin, MD;  Location: [facility];  Service: Urology;  Laterality: N/A;  Rep coming, time needs to stay at 2:00   [place]     right lung    POSTERIOR CERVICAL LAMINECTOMY Right 04/05/2017   Procedure: Right C6-7 Posterior cervical laminectomy;  Surgeon: Kary Kos, MD;  Location: [facility];  Service: Neurosurgery;  Laterality: Right;  Right C6-7 Posterior cervical laminectomy   SHOULDER ARTHROSCOPY WITH ROTATOR CUFF REPAIR Right 10/11/2016   Procedure: SHOULDER ARTHROSCOPY;  Surgeon: Carole Civil, MD;  Location: [facility];  Service: Orthopedics;  Laterality: Right;   TOTAL HIP ARTHROPLASTY Right 04/15/2020   Procedure: RIGHT TOTAL HIP ARTHROPLASTY ANTERIOR APPROACH;  Surgeon: Mcarthur Rossetti, MD;  Location: [facility];  Service: Orthopedics;  Laterality: Right;   TOTAL HIP ARTHROPLASTY Left 07/22/2020   Procedure: LEFT  HIP ARTHROPLASTY ANTERIOR APPROACH;  Surgeon: Mcarthur Rossetti, MD;  Location: [facility];  Service: Orthopedics;  Laterality: Left;  RNFA   TUBAL LIGATION      Home Medications:  Allergies as of 06/06/2022       Reactions  Aspirin Shortness Of Breath   Causes asthma flares    Penicillins Other (See Comments)   UNSPECIFIED REACTION OF CHILDHOOD Has patient had a PCN reaction causing immediate rash, facial/tongue/throat swelling, SOB or lightheadedness with hypotension: NO Has patient had a PCN reaction causing severe rash involving mucus membranes or skin necrosis: NO Has patient had a PCN reaction that required hospitalization: no Has patient had a PCN reaction occurring within the last 10 years: NO If all of the above answers are "NO", then may proceed with Cephalosporin use.   Vicodin  [hydrocodone-acetaminophen] Other (See Comments)   Headaches         Medication List        Accurate as of June 06, 2022  9:11 AM. If you have any questions, ask your nurse or doctor.          acetaminophen 500 MG tablet Commonly known as: TYLENOL Take 1,000 mg by mouth every 6 (six) hours as needed for moderate pain.   albuterol 108 (90 Base) MCG/ACT inhaler Commonly known as: VENTOLIN HFA Inhale 2 puffs into the lungs every 4 (four) hours as needed for shortness of breath.   albuterol (2.5 MG/3ML) 0.083% nebulizer solution Commonly known as: PROVENTIL Take 3 mLs (2.5 mg total) by nebulization every 6 (six) hours as needed for wheezing or shortness of breath.   Breztri Aerosphere 160-9-4.8 MCG/ACT Aero Generic drug: Budeson-Glycopyrrol-Formoterol Inhale 2 puffs into the lungs in the morning and at bedtime.   budesonide 0.5 MG/2ML nebulizer solution Commonly known as: PULMICORT Take 2 mLs (0.5 mg total) by nebulization 2 (two) times daily as needed.   cetirizine 10 MG tablet Commonly known as: ZYRTEC Take 10 mg by mouth daily.   citalopram 20 MG tablet Commonly known as: CELEXA Take 20 mg by mouth daily.   Fasenra Pen 30 MG/ML Soaj Generic drug: Benralizumab Inject 1 mL (30 mg total) into the skin every 8 (eight) weeks.   gabapentin 100 MG capsule Commonly known as: NEURONTIN Take by mouth.   levothyroxine 75 MCG tablet Commonly known as: SYNTHROID Take 1 tablet (75 mcg total) by mouth daily before breakfast. What changed: how much to take   LORazepam 1 MG tablet Commonly known as: ATIVAN Take 1.5 mg by mouth 2 (two) times daily.   montelukast 10 MG tablet Commonly known as: SINGULAIR TAKE 1 TABLET BY MOUTH AT BEDTIME   NASACORT ALLERGY 24HR NA Place 2 sprays into the nose daily as needed (allergies).   OneTouch Delica Plus 0000000 Misc Apply topically.   pantoprazole 40 MG tablet Commonly known as: PROTONIX Take 30- 60 min before your  first and last meals of the day What changed:  how much to take how to take this when to take this additional instructions   pravastatin 40 MG tablet Commonly known as: PRAVACHOL Take 40 mg by mouth at bedtime.   predniSONE 5 MG tablet Commonly known as: DELTASONE Take 1 tablet (5 mg total) by mouth daily with breakfast.   pregabalin 75 MG capsule Commonly known as: LYRICA Take 100 mg by mouth 2 (two) times daily.   Spacer/Aero-Holding Dorise Bullion 1 each by Does not apply route daily as needed.   SUMAtriptan 6 MG/0.5ML Soaj Inject 6 mg as directed daily as needed (migraine).   valACYclovir 500 MG tablet Commonly known as: VALTREX Take 500-2,000 mg by mouth See admin instructions. Take '2000mg'$ s at first sign of fever blister outbreak then take '500mg'$ s daily until gone   VITAMIN D3 SUPER  STRENGTH PO Take 5,000 Units by mouth daily.        Allergies:  Allergies  Allergen Reactions   Aspirin Shortness Of Breath    Causes asthma flares    Penicillins Other (See Comments)    UNSPECIFIED REACTION OF CHILDHOOD  Has patient had a PCN reaction causing immediate rash, facial/tongue/throat swelling, SOB or lightheadedness with hypotension: NO Has patient had a PCN reaction causing severe rash involving mucus membranes or skin necrosis: NO Has patient had a PCN reaction that required hospitalization: no Has patient had a PCN reaction occurring within the last 10 years: NO If all of the above answers are "NO", then may proceed with Cephalosporin use.    Vicodin [Hydrocodone-Acetaminophen] Other (See Comments)    Headaches     Family History: Family History  Problem Relation Age of Onset   Cancer Mother    COPD Mother    Diabetes Mother    Hyperlipidemia Mother    Hypertension Mother    Hyperlipidemia Father    Hypertension Father    Heart failure Maternal Grandmother    Hypertension Maternal Grandmother    Thyroid disease Maternal Grandmother    Diabetes Paternal  Grandmother    Alzheimer's disease Paternal Grandmother    Heart failure Paternal Grandfather    Hypertension Paternal Grandfather    Bipolar disorder Son    Colon cancer Neg Hx     Social History:  reports that she quit smoking about 14 years ago. Her smoking use included cigarettes. She has a 30.00 pack-year smoking history. She has never used smokeless tobacco. She reports that she does not drink alcohol and does not use drugs.  ROS: All other review of systems were reviewed and are negative except what is noted above in HPI  Physical Exam: BP (!) 99/58   Pulse 86   Constitutional:  Alert and oriented, No acute distress. HEENT: [place] AT, moist mucus membranes.  Trachea midline, no masses. Cardiovascular: No clubbing, cyanosis, or edema. Respiratory: Normal respiratory effort, no increased work of breathing. GI: Abdomen is soft, nontender, nondistended, no abdominal masses GU: No CVA tenderness.  Lymph: No cervical or inguinal lymphadenopathy. Skin: No rashes, bruises or suspicious lesions. Neurologic: Grossly intact, no focal deficits, moving all 4 extremities. Psychiatric: Normal mood and affect.  Laboratory Data: Lab Results  Component Value Date   WBC 12.8 (H) 10/30/2021   HGB 10.5 (L) 10/30/2021   HCT 32.8 (L) 10/30/2021   MCV 92.7 10/30/2021   PLT 284 10/30/2021    Lab Results  Component Value Date   CREATININE 1.1 02/03/2022    No results found for: "PSA"  No results found for: "TESTOSTERONE"  Lab Results  Component Value Date   HGBA1C 5.8 04/11/2022    Urinalysis    Component Value Date/Time   APPEARANCEUR Clear 06/07/2021 1148   GLUCOSEU Negative 06/07/2021 1148   BILIRUBINUR Negative 06/07/2021 1148   PROTEINUR Negative 06/07/2021 1148   NITRITE Negative 06/07/2021 1148   LEUKOCYTESUR Negative 06/07/2021 1148    Lab Results  Component Value Date   LABMICR See below: 06/07/2021   WBCUA 0-5 06/07/2021   LABEPIT 0-10 06/07/2021   BACTERIA Few  (A) 06/07/2021    Pertinent Imaging: KUb today: Images reviewed and discussed with the patient  Results for orders placed during the hospital encounter of 06/11/19  DG Abd 1 View  Narrative CLINICAL DATA:  Partial intestinal obstruction.  EXAM: ABDOMEN - 1 VIEW  COMPARISON:  None.  FINDINGS: Bowel gas pattern  is normal without evidence of ileus or obstruction on this single view. No abnormal calcifications or acute bone findings.  IMPRESSION: Unremarkable one-view abdominal radiograph.   Electronically Signed By: Nelson Chimes M.D. On: 06/11/2019 15:39  No results found for this or any previous visit.  No results found for this or any previous visit.  No results found for this or any previous visit.  No results found for this or any previous visit.  No valid procedures specified. No results found for this or any previous visit.  No results found for this or any previous visit.   Assessment & Plan:    1. Urinary incontinence, unspecified type -We discussed interstim revision and the patient wishes to proceed with surgery. Risks/benefits/alternatives discussed - Urinalysis, Routine w reflex microscopic   No follow-ups on file.  Nicolette Bang, MD  [facility] [place]

## 2022-06-06 NOTE — Patient Instructions (Addendum)
Asthma COPD overlap syndrome Continue montelukast 10 mg once a day to prevent cough or wheeze Continue Breztri 2 puffs twice a day with a spacer to prevent cough or wheeze Continue prednisone 5 mg to 7.5 mg once a day.  Continue to follow-up with Dr. Dorris Fetch for further dosing of prednisone Continue albuterol 2 puffs every 4 hours as needed for cough or wheeze OR Instead use albuterol 0.083% solution via nebulizer one unit vial every 4 hours as needed for cough or wheeze You may use albuterol 2 puffs 5 to 15 minutes before activity to decrease cough or wheeze For asthma flare, begin budesonide 0.5 mg via nebulizer twice a day for 1 to 2 weeks or until cough and wheeze free Continue Fasenra injections once every 8 weeks Continue oxygen at 3 L nasal cannula as previously prescribed  Allergic rhinitis Continue allergen avoidance measures directed toward horse, tobacco leaf, grass pollen, ragweed pollen, tree pollen, mold, dust mite, cat, and dog as listed below Continue cetirizine 10 mg once a day as needed for runny nose or itch Continue Nasacort 2 sprays in each nostril once a day as needed for stuffy nose Consider saline nasal rinses as needed for nasal symptoms. Use this before any medicated nasal sprays for best result  Call the clinic if this treatment plan is not working well for you.  Follow up in 6 months or sooner if needed.  Reducing Pollen Exposure The American Academy of Allergy, Asthma and Immunology suggests the following steps to reduce your exposure to pollen during allergy seasons. Do not hang sheets or clothing out to dry; pollen may collect on these items. Do not mow lawns or spend time around freshly cut grass; mowing stirs up pollen. Keep windows closed at night.  Keep car windows closed while driving. Minimize morning activities outdoors, a time when pollen counts are usually at their highest. Stay indoors as much as possible when pollen counts or humidity is high and on  windy days when pollen tends to remain in the air longer. Use air conditioning when possible.  Many air conditioners have filters that trap the pollen spores. Use a HEPA room air filter to remove pollen form the indoor air you breathe.  Control of Mold Allergen Mold and fungi can grow on a variety of surfaces provided certain temperature and moisture conditions exist.  Outdoor molds grow on plants, decaying vegetation and soil.  The major outdoor mold, Alternaria and Cladosporium, are found in very high numbers during hot and dry conditions.  Generally, a late Summer - Fall peak is seen for common outdoor fungal spores.  Rain will temporarily lower outdoor mold spore count, but counts rise rapidly when the rainy period ends.  The most important indoor molds are Aspergillus and Penicillium.  Dark, humid and poorly ventilated basements are ideal sites for mold growth.  The next most common sites of mold growth are the bathroom and the kitchen.  Outdoor [employer] Use air conditioning and keep windows closed Avoid exposure to decaying vegetation. Avoid leaf raking. Avoid grain handling. Consider wearing a face mask if working in moldy areas.  Indoor Mold Control Maintain humidity below 50%. Clean washable surfaces with 5% bleach solution. Remove sources e.g. Contaminated carpets.   Control of Dust Mite Allergen Dust mites play a major role in allergic asthma and rhinitis. They occur in environments with high humidity wherever human skin is found. Dust mites absorb humidity from the atmosphere (ie, they do not drink) and feed on  organic matter (including shed human and animal skin). Dust mites are a microscopic type of insect that you cannot see with the naked eye. High levels of dust mites have been detected from mattresses, pillows, carpets, upholstered furniture, bed covers, clothes, soft toys and any woven material. The principal allergen of the dust mite is found in its feces. A gram of dust  may contain 1,000 mites and 250,000 fecal particles. Mite antigen is easily measured in the air during house cleaning activities. Dust mites do not bite and do not cause harm to humans, other than by triggering allergies/asthma.  Ways to decrease your exposure to dust mites in your home:  1. Encase mattresses, box springs and pillows with a mite-impermeable barrier or cover  2. Wash sheets, blankets and drapes weekly in hot water (130 F) with detergent and dry them in a dryer on the hot setting.  3. Have the room cleaned frequently with a vacuum cleaner and a damp dust-mop. For carpeting or rugs, vacuuming with a vacuum cleaner equipped with a high-efficiency particulate air (HEPA) filter. The dust mite allergic individual should not be in a room which is being cleaned and should wait 1 hour after cleaning before going into the room.  4. Do not sleep on upholstered furniture (eg, couches).  5. If possible removing carpeting, upholstered furniture and drapery from the home is ideal. Horizontal blinds should be eliminated in the rooms where the person spends the most time (bedroom, study, television room). Washable vinyl, roller-type shades are optimal.  6. Remove all non-washable stuffed toys from the bedroom. Wash stuffed toys weekly like sheets and blankets above.  7. Reduce indoor humidity to less than 50%. Inexpensive humidity monitors can be purchased at most hardware stores. Do not use a humidifier as can make the problem worse and are not recommended.  Control of Dog or Cat Allergen Avoidance is the best way to manage a dog or cat allergy. If you have a dog or cat and are allergic to dog or cats, consider removing the dog or cat from the home. If you have a dog or cat but don't want to find it a new home, or if your family wants a pet even though someone in the household is allergic, here are some strategies that may help keep symptoms at bay:  Keep the pet out of your bedroom and  restrict it to only a few rooms. Be advised that keeping the dog or cat in only one room will not limit the allergens to that room. Don't pet, hug or kiss the dog or cat; if you do, wash your hands with soap and water. High-efficiency particulate air (HEPA) cleaners run continuously in a bedroom or living room can reduce allergen levels over time. Regular use of a high-efficiency vacuum cleaner or a central vacuum can reduce allergen levels. Giving your dog or cat a bath at least once a week can reduce airborne allergen.   Control of Dust Mite Allergen Dust mites play a major role in allergic asthma and rhinitis. They occur in environments with high humidity wherever human skin is found. Dust mites absorb humidity from the atmosphere (ie, they do not drink) and feed on organic matter (including shed human and animal skin). Dust mites are a microscopic type of insect that you cannot see with the naked eye. High levels of dust mites have been detected from mattresses, pillows, carpets, upholstered furniture, bed covers, clothes, soft toys and any woven material. The principal allergen of  the dust mite is found in its feces. A gram of dust may contain 1,000 mites and 250,000 fecal particles. Mite antigen is easily measured in the air during house cleaning activities. Dust mites do not bite and do not cause harm to humans, other than by triggering allergies/asthma.  Ways to decrease your exposure to dust mites in your home:  1. Encase mattresses, box springs and pillows with a mite-impermeable barrier or cover  2. Wash sheets, blankets and drapes weekly in hot water (130 F) with detergent and dry them in a dryer on the hot setting.  3. Have the room cleaned frequently with a vacuum cleaner and a damp dust-mop. For carpeting or rugs, vacuuming with a vacuum cleaner equipped with a high-efficiency particulate air (HEPA) filter. The dust mite allergic individual should not be in a room which is being  cleaned and should wait 1 hour after cleaning before going into the room.  4. Do not sleep on upholstered furniture (eg, couches).  5. If possible removing carpeting, upholstered furniture and drapery from the home is ideal. Horizontal blinds should be eliminated in the rooms where the person spends the most time (bedroom, study, television room). Washable vinyl, roller-type shades are optimal.  6. Remove all non-washable stuffed toys from the bedroom. Wash stuffed toys weekly like sheets and blankets above.  7. Reduce indoor humidity to less than 50%. Inexpensive humidity monitors can be purchased at most hardware stores. Do not use a humidifier as can make the problem worse and are not recommended.

## 2022-06-07 ENCOUNTER — Encounter: Payer: Self-pay | Admitting: Family Medicine

## 2022-06-07 ENCOUNTER — Encounter: Payer: Self-pay | Admitting: Urology

## 2022-06-07 ENCOUNTER — Encounter: Payer: Self-pay | Admitting: Radiology

## 2022-06-07 NOTE — Patient Instructions (Signed)
Sacral Nerve Stimulator Implantation Sacral nerve stimulator implantation is a procedure to place a device under the skin. The device is used to treat disorders that make it hard to control urine and bowel movements. The device generates mild electrical impulses. It is placed permanently in the area of the upper buttocks. Wires (electrodes) are also inserted into the body so that electrical impulses generated by the device can be sent to the sacral nerves. The sacral nerves control several functions in the lower part of the body, including the passing of urine and stool. Before having sacral nerve stimulator implantation, you may undergo a trial test called a percutaneous nerve evaluation. This trial, which usually lasts 1-2 weeks, helps to determine if sacral nerve stimulation will help your condition. Sacral nerve stimulator implantation is a two-stage surgery. Stage 1 of this surgery involves implanting an electrode into your lower back, near your sacral nerve. A wire (lead) is attached to the electrode and run under the skin to exit through your back. The sacral nerve stimulator lead is attached to a handheld device. This trial helps determine if sacral nerve stimulation will help your condition. If your bladder symptoms improve by at least 50 percent during the trial phase, you may have the second stage. After the device is fully implanted in stage 2, it can be used 24 hours a day. The stimulator will be programmed for you. Your health care provider will set how strong the pulses will be (intensity) and how often they occur (frequency). Tell a health care provider about: Any allergies you have. All medicines you are taking, including vitamins, herbs, eye drops, creams, and over-the-counter medicines. Any problems you or family members have had with anesthetic medicines. Any blood disorders you have. Any surgeries you have had. Any medical conditions you have or have had. Whether you are pregnant or  may be pregnant. What are the risks? Generally, this is a safe procedure. However, problems may occur, including: Infection. Bleeding. Uncomfortable sensations, such as a jolting or shocking feeling. Movement of the electrode away from the place where it was inserted (migration). Failure of the stimulator. Damage to nearby structures or organs, such as nerves near the spine. Allergic reactions to medicines or the device. What happens before the procedure? Staying hydrated Follow instructions from your health care provider about hydration, which may include: Up to 2 hours before the procedure - you may continue to drink clear liquids, such as water, clear fruit juice, black coffee, and plain tea.  Eating and drinking restrictions Follow instructions from your health care provider about eating and drinking, which may include: 8 hours before the procedure - stop eating heavy meals or foods, such as meat, fried foods, or fatty foods. 6 hours before the procedure - stop eating light meals or foods, such as toast or cereal. 6 hours before the procedure - stop drinking milk or drinks that contain milk. 2 hours before the procedure - stop drinking clear liquids. Medicines Ask your health care provider about: Changing or stopping your regular medicines. This is especially important if you are taking diabetes medicines or blood thinners. Taking medicines such as aspirin and ibuprofen. These medicines can thin your blood. Do not take these medicines unless your health care provider tells you to take them. Taking over-the-counter medicines, vitamins, herbs, and supplements. General instructions Plan to have a responsible adult take you home from the hospital or clinic. If you will be going home right after the procedure, plan to have a responsible  adult care for you for the time you are told. This is important. Ask your health care provider what steps will be taken to help prevent infection. These  steps may include: Removing hair at the surgery site. Washing skin with a germ-killing soap. Taking antibiotic medicine. What happens during the procedure?  An IV will be inserted into one of your veins. You will be given one or more of the following: A medicine to help you relax (sedative). A medicine to numb the area (local anesthetic). A medicine to make you fall asleep (general anesthetic). Long needles will be inserted into your lower back. The needles will be guided to the place where the nerves exit the backbone. The position of the needles will be tested. If they are in the right spot, your toes or feet may move. If you are awake, you may feel a tingling in your legs. The electrodes will be inserted through the needles and into your body. The electrodes will be anchored in place close to your sacral nerves. Small incisions will be made under the skin in your upper buttocks. The sacral nerve stimulator device will be placed in this area. The ends of the electrodes that attach to the stimulator will be guided under your skin. They will extend from your sacral nerves, where they are anchored, to the stimulator device. They will then be attached to the device. Your health care provider will program the rate at which the nerve stimulator will deliver the electronic pulses. The incisions will be closed with stitches (sutures) or staples. A bandage (dressing) will be placed over the incision area. The procedure may vary among health care providers and hospitals. What happens after the procedure? Your blood pressure, heart rate, breathing rate, and blood oxygen level will be monitored until you leave the hospital or clinic. You may be given pain and antibiotic medicine as needed. You will be taught how to use and care for the device. If you were given a sedative during the procedure, it can affect you for several hours. Do not drive or operate machinery until your health care provider says  that it is safe. Summary Sacral nerve stimulator implantation is a procedure to place a device under your skin. The device will treat disorders that make it hard to control urine and bowel movements. The sacral nerves control several functions in the lower part of the body, including bladder and bowel functions. If you will be going home right after the procedure, plan to have a responsible adult care for you for the time you are told. You will be taught how to use and care for the device. If you were given a sedative during the procedure, do not drive until your health care provider approves. This information is not intended to replace advice given to you by your health care provider. Make sure you discuss any questions you have with your health care provider. Document Revised: 10/30/2019 Document Reviewed: 10/30/2019 Elsevier Patient Education  [place].

## 2022-06-08 ENCOUNTER — Ambulatory Visit (HOSPITAL_COMMUNITY)
Admission: RE | Admit: 2022-06-08 | Discharge: 2022-06-08 | Disposition: A | Discharge status: Home / Self Care | Payer: Medicare Other | Source: Ambulatory Visit | Attending: Neurology | Admitting: Neurology

## 2022-06-08 DIAGNOSIS — R269 Unspecified abnormalities of gait and mobility: Secondary | ICD-10-CM

## 2022-06-11 ENCOUNTER — Telehealth: Payer: Self-pay

## 2022-06-11 NOTE — Telephone Encounter (Signed)
Patient called concerned about large stool burden in the right colon found on KUB.  Please advise on any recommendations

## 2022-06-12 NOTE — Telephone Encounter (Signed)
Patient called and made aware.

## 2022-06-13 NOTE — Telephone Encounter (Signed)
Pt called wanting to know if the RN can call back and explain the MRI results.

## 2022-06-13 NOTE — Telephone Encounter (Signed)
I was able to talk with patient and her husband, explained MRI findings from June 08, 2022,  MRI of the brain showed mild small vessel disease, no acute abnormality,  MRI of cervical spine showed evidence of a primary of C4-C7 ACDF, no significant canal foraminal narrowing  Now of above findings would explain her complaints of gait difficulty, lightheadedness,  Encouraged her moderate exercise, keep follow-up appointment with physical therapy, we will see her again May

## 2022-06-13 NOTE — Telephone Encounter (Signed)
Called pt back and she is requesting Dr. Krista Blue give her a call because she have questions. Stated she seen something on my chart that stated she had a brain disease and she can't wait until 5/23 to talk to her.

## 2022-06-14 ENCOUNTER — Encounter: Payer: Self-pay | Admitting: Radiology

## 2022-06-15 ENCOUNTER — Other Ambulatory Visit: Payer: Self-pay

## 2022-06-15 ENCOUNTER — Ambulatory Visit (HOSPITAL_COMMUNITY): Payer: Medicare Other | Attending: Neurology

## 2022-06-15 DIAGNOSIS — R269 Unspecified abnormalities of gait and mobility: Secondary | ICD-10-CM | POA: Diagnosis not present

## 2022-06-15 DIAGNOSIS — M25551 Pain in right hip: Secondary | ICD-10-CM | POA: Insufficient documentation

## 2022-06-15 DIAGNOSIS — R262 Difficulty in walking, not elsewhere classified: Secondary | ICD-10-CM | POA: Insufficient documentation

## 2022-06-15 NOTE — Therapy (Signed)
OUTPATIENT PHYSICAL THERAPY LOWER EXTREMITY EVALUATION   Patient Name: [name] MRN: [id number] DOB:1966/04/07, 57 y.o., female Today's Date: 06/15/2022  END OF SESSION:  PT End of Session - 06/15/22 1355     Visit Number 1    Number of Visits 16    Date for PT Re-Evaluation 08/10/22   may need to be re-evaluated after the bladder impant surgery on 07/26/22   Authorization Type BCBS Medicare, Medicaid of [place]    Progress Note Due on Visit 8    PT Start Time 1345    PT Stop Time 1430    PT Time Calculation (min) 45 min    Equipment Utilized During Treatment Gait belt;Oxygen    Activity Tolerance Patient limited by fatigue    Behavior During Therapy [facility] for tasks assessed/performed            Past Medical History:  Diagnosis Date   Anemia    Anxiety    Arthritis    Asthma    Cervical radiculopathy    Cluster headaches    COPD (chronic obstructive pulmonary disease) (HCC)    COPD (chronic obstructive pulmonary disease) with chronic bronchitis    GERD (gastroesophageal reflux disease)    Hyperlipemia    Hypoglycemia    Hypothyroidism    Migraine    Pneumothorax    PONV (postoperative nausea and vomiting)    Pre-diabetes    Rectal discharge 07/28/2010   Shortness of breath    Sleep apnea    cannot tolerate-not using CPAP   Sluggishness 07/28/2010   Past Surgical History:  Procedure Laterality Date   ABDOMINAL HYSTERECTOMY     with bladder suspension   CERVICAL FUSION     CHOLECYSTECTOMY     COLONOSCOPY N/A 07/30/2013   Procedure: COLONOSCOPY;  Surgeon: Rogene Houston, MD;  [facility];  Service: Endoscopy;  Laterality: N/A;  [place] OF SHOULDER Right 10/11/2016   Procedure: EXAM UNDER ANESTHESIA WITH MANIPULATION OF SHOULDER;  Surgeon: Carole Civil, MD;  Location: [facility];  Service: Orthopedics;  Laterality: Right;   HERNIA REPAIR     INCISIONAL HERNIA REPAIR N/A 07/30/2016   Procedure: HERNIA  REPAIR INCISIONAL WITH MESH;  Surgeon: Aviva Signs, MD;  Location: [facility];  Service: General;  Laterality: N/A;   INTERSTIM IMPLANT PLACEMENT  02/02/2021   Procedure: [place] IMPLANT FIRST STAGE LEFT SACRUM ;  Surgeon: Cleon Gustin, MD;  Location: [facility];  Service: Urology;;   Barrie Lyme IMPLANT PLACEMENT N/A 02/23/2021   Procedure: Barrie Lyme IMPLANT SECOND STAGE;  Surgeon: Cleon Gustin, MD;  Location: [facility];  Service: Urology;  Laterality: N/A;  Rep coming, time needs to stay at 2:00   [place]     right lung    POSTERIOR CERVICAL LAMINECTOMY Right 04/05/2017   Procedure: Right C6-7 Posterior cervical laminectomy;  Surgeon: Kary Kos, MD;  Location: [facility];  Service: Neurosurgery;  Laterality: Right;  Right C6-7 Posterior cervical laminectomy   SHOULDER ARTHROSCOPY WITH ROTATOR CUFF REPAIR Right 10/11/2016   Procedure: SHOULDER ARTHROSCOPY;  Surgeon: Carole Civil, MD;  Location: [facility];  Service: Orthopedics;  Laterality: Right;   TOTAL HIP ARTHROPLASTY Right 04/15/2020   Procedure: RIGHT TOTAL HIP ARTHROPLASTY ANTERIOR APPROACH;  Surgeon: Mcarthur Rossetti, MD;  Location: [facility];  Service: Orthopedics;  Laterality: Right;   TOTAL HIP ARTHROPLASTY Left 07/22/2020   Procedure: LEFT  HIP ARTHROPLASTY ANTERIOR APPROACH;  Surgeon: Mcarthur Rossetti, MD;  Location: [facility];  Service: Orthopedics;  Laterality: Left;  RNFA   TUBAL LIGATION     Patient Active Problem List   Diagnosis Date Noted   Severe persistent asthma without complication [id number]   Gait abnormality 05/28/2022   Adrenal insufficiency ([place]) 03/30/2022   Endocrine disorder, unspecified 07/04/2021   Prediabetes 07/04/2021   Hypothyroidism 07/04/2021   Former smoker 06/29/2021   Seasonal and perennial allergic rhinitis 06/19/2021   Oxygen dependent 06/19/2021   Steroid dependent ([place]) 06/19/2021   Immunosuppressed status ([place]) 06/19/2021   Radiculopathy, lumbar region  12/08/2020   Status post left hip replacement 07/22/2020   Status post right hip replacement 05/02/2020   Status post total replacement of right hip 04/15/2020   Avascular necrosis of bone of left hip ([place]) 02/10/2020   Avascular necrosis of bone of right hip ([place]) 02/10/2020   Allergic rhinitis 05/21/2019   Asthma-COPD overlap syndrome 04/30/2019   Chronic respiratory failure with hypoxia ([place]) 04/30/2019   Spinal stenosis of cervical region 04/05/2017   Partial tear of right subscapularis tendon    Incisional hernia, without obstruction or gangrene    Urinary frequency 03/21/2012   Migraine equivalent syndrome 03/21/2012   Insomnia 03/21/2012    PCP: Celene Squibb, MD  REFERRING PROVIDER: Marcial Pacas, MD  REFERRING DIAG: R26.9 (ICD-10-CM) - Gait abnormality  THERAPY DIAG:  Difficulty in walking, not elsewhere classified  Rationale for Evaluation and Treatment: Rehabilitation  ONSET DATE: April 12, 2022  SUBJECTIVE:   SUBJECTIVE STATEMENT: Patient comes to the clinic with c/o LE weakness, difficulty with walking, and unsteadiness. Condition started on April 12, 2022 when patient had severe difficulty walking all of a sudden and was off balance. Patient then went to her PCP and altered the dosage of her meds. 2 weeks later, patient was referred to the neurologist. Neurologist recommended MRI and referred patient to outpatient PT evaluation and management.  PERTINENT HISTORY: Bladder implant (will have surgery 07/26/2022), hx of B THR, lobectomy PAIN:  Are you having pain? No  PRECAUTIONS: Fall and Other: uses portable O2  WEIGHT BEARING RESTRICTIONS: No  FALLS:  Has patient fallen in last 6 months? Yes. Number of falls 7  LIVING ENVIRONMENT: Lives with: lives with their spouse Lives in: House/apartment Stairs: Yes: External: 1 steps; none Has following equipment at home: Single point cane, Walker - 2 wheeled, Environmental consultant - 4 wheeled, shower chair, bed side commode, and  oxygen  OCCUPATION: on disability  PLOF: Independent with household mobility with device and Requires assistive device for independence  PATIENT GOALS: "to be able to walk straight by myself"  NEXT MD VISIT: Aug 30, 2022  OBJECTIVE:   DIAGNOSTIC FINDINGS:  MRI HEAD WITHOUT CONTRAST 06/08/22   TECHNIQUE: Multiplanar, multiecho pulse sequences of the brain and surrounding structures were obtained without intravenous contrast.   COMPARISON:  Head CT 05/22/2022. Brain MRI 05/22/2011.   FINDINGS: Brain:   No age advanced or lobar predominant parenchymal atrophy.   Multifocal T2 FLAIR hyperintense signal abnormality within the cerebral white matter, overall mild but greater than expected for age.   There is no acute infarct.   No evidence of an intracranial mass.   No chronic intracranial blood products.   No extra-axial fluid collection.   No midline shift.   Vascular: Maintained flow voids within the proximal large arterial vessels.   Skull and upper cervical spine: No focal suspicious calvarial marrow lesion. Cervical spine findings separately reported on same day  cervical spine MRI.   Sinuses/Orbits: No mass or acute finding within the imaged orbits. Prior bilateral ocular lens replacement. Trace mucosal thickening within left ethmoid air cells.   Other: Small-volume fluid within the bilateral mastoid air cells.   IMPRESSION: 1.  No evidence of an acute intracranial abnormality. 2. Multifocal T2 FLAIR hyperintense signal abnormality within the cerebral white matter, overall mild but greater than expected for age. These signal changes have slightly progressed from the prior brain MRI of 05/22/2011. Findings are nonspecific and differential considerations include chronic small vessel ischemic disease, sequelae of chronic migraine headaches, sequelae of a prior infectious/inflammatory process or sequelae of a demyelinating process (such as multiple sclerosis),  among others. No lesions specifically suggest demyelinating disease. 3. Small-volume fluid within the bilateral mastoid air cells.  MRI CERVICAL SPINE WITHOUT CONTRAST 06/08/22   TECHNIQUE: Multiplanar, multisequence MR imaging of the cervical spine was performed. No intravenous contrast was administered.   COMPARISON:  Radiographs of the cervical spine 08/06/2017. Cervical spine MRI 02/24/2017.   FINDINGS: Alignment: Straightening of the expected cervical lordosis. 3 mm C7-T1 grade 1 anterolisthesis.   Vertebrae: Vertebral body height is maintained. Susceptibility artifact arising from ACDF hardware at the C4-C7 levels. No appreciable significant marrow edema or focal suspicious osseous lesion.   Cord: No signal abnormality identified within the cervical spinal cord.   Posterior Fossa, vertebral arteries, paraspinal tissues: Posterior fossa assessed on same-day brain MRI. Flow voids preserved within the imaged cervical vertebral arteries. No paraspinal mass or collection.   Disc levels:   Unless otherwise stated, the level by level findings below have not significantly changed from the prior MRI of 03/06/2017.   Disc degeneration at the non-operative levels, greatest at C3-C4 and C7-T1 (progressed at these levels).   C2-C3: Slight disc bulge. No significant spinal canal or foraminal stenosis.   C3-C4: Disc bulge. New superimposed small central disc protrusion (at site of posterior annular fissure). Uncovertebral hypertrophy on the right, new from the prior MRI. Facet arthrosis. Mild relative spinal canal narrowing (without spinal cord mass effect), new from the prior MRI. Moderate right neural foraminal narrowing, also new from the prior MRI.   C4-C5: Prior ACDF. No significant spinal canal or foraminal stenosis.   C5-C6: Prior ACDF. Uncovertebral hypertrophy on the right. No significant spinal canal stenosis. Mild right neural foraminal narrowing.   C6-C7: Prior  ACDF. Uncovertebral vertebrae on the right. No significant spinal canal stenosis. Moderate right neural foraminal narrowing.   C7-T1: 3 mm grade 1 anterolisthesis. Mild disc uncovering. Facet arthrosis. No significant spinal canal stenosis. Mild-to-moderate bilateral neural foraminal narrowing.   IMPRESSION: Prior C4-C7 ACDF. No significant spinal canal stenosis at these levels. As before, uncovertebral hypertrophy results in neural foraminal narrowing on the right at C5-C6 (mild), and on the right at C6-C7 (moderate).   Cervical spondylosis at the remaining levels, as outlined and with findings most notably as follows.   At C3-C4, there is a disc bulge. New superimposed small central disc protrusion (at site of posterior annular fissure). Uncovertebral hypertrophy on the right, new from the prior MRI. Facet arthrosis. Mild spinal canal narrowing, and moderate right neural foraminal narrowing, new from the prior MRI.   At C7-T1, there is 3 mm grade 1 anterolisthesis. Mild disc uncovering. Facet arthrosis. No significant spinal canal stenosis. Mild-to-moderate bilateral neural foraminal narrowing. Findings at this level have not significantly changed.  COGNITION: Overall cognitive status: Within functional limits for tasks assessed     SENSATION: Not tested  MUSCLE  LENGTH: Mild restriction on B hamstrings Moderate restriction on B gastrocsoleus  POSTURE: rounded shoulders and forward head   LOWER EXTREMITY MMT  MMT Right eval Left eval  Hip flexion 4 4  Hip extension 2+ (bridging) 2+  (bridging)  Hip abduction 4 4  Hip adduction    Hip internal rotation    Hip external rotation    Knee flexion 4- 4-  Knee extension 4- 3+  Ankle dorsiflexion 3+ 4-  Ankle plantarflexion 3+ 3+  Ankle inversion    Ankle eversion     (Blank rows = not tested)  LOWER EXTREMITY ROM  Active ROM Right eval Left eval  Hip flexion [facility]  Hip extension    Hip abduction [facility]   Hip adduction [facility]  Hip internal rotation    Hip external rotation    Knee flexion [facility] WFL  Knee extension [facility]  Ankle dorsiflexion [facility] WFL  Ankle plantarflexion [facility] WFL  Ankle inversion    Ankle eversion     (Blank rows = not tested)  FUNCTIONAL TESTS:  5 times sit to stand: 26.97 sec (has to hold on to rollator) 2 minute walk test: 224 ft (with a rollator) Tinetti POMA : 9 (balance + gait)  GAIT: Distance walked: 224 ft Assistive device utilized:  rollator Level of assistance: Modified independence Comments: decrease foot clearance on B, decreased step length on B   TODAY'S TREATMENT:                                                                                                                              DATE:  06/15/22 Evaluation and patient education done    PATIENT EDUCATION:  Education details: Educated on the pathophysiology of weakness. Educated on the goals and course of rehab. Educated on falls risk reduction at home Person educated: Patient Education method: Explanation Education comprehension: verbalized understanding  HOME EXERCISE PROGRAM: None provided to date  ASSESSMENT:  CLINICAL IMPRESSION: Patient is a 57 y.o. female who was seen today for physical therapy evaluation and treatment for gait abnormality. Patient was diagnosed with gait abnormality by referring provider further defined by difficulty with community ambulation due to weakness, incoordination and impaired proprioception. Skilled PT is required to address the impairments and functional limitations listed below.   OBJECTIVE IMPAIRMENTS: Abnormal gait, decreased balance, decreased coordination, decreased endurance, difficulty walking, decreased strength, impaired flexibility, and pain.   ACTIVITY LIMITATIONS: carrying, lifting, bending, standing, squatting, stairs, transfers, continence, bathing, toileting, dressing, self feeding, hygiene/grooming, and locomotion  level  PARTICIPATION LIMITATIONS: meal prep, cleaning, laundry, driving, shopping, community activity, and yard work  PERSONAL FACTORS: Fitness level, Bladder implant (will have surgery 07/26/2022), hx of B THR, lobectomy are also affecting patient's functional outcome.   REHAB POTENTIAL: Fair    CLINICAL DECISION MAKING: Evolving/moderate complexity  EVALUATION COMPLEXITY: Moderate   GOALS: Goals reviewed with patient? Yes  SHORT TERM GOALS: Target date: 07/13/2022  Pt will demonstrate  indep in HEP to facilitate carry-over of skilled services and improve functional outcomes Goal status: INITIAL  LONG TERM GOALS: Target date: 08/10/2022  Pt will decrease 5TSTS by at least 6 seconds in order to demonstrate clinically significant improvement in LE strength  Baseline: 26.97 sec Goal status: INITIAL  2.  Patient will demonstrate increase in Tinetti Score by 8 points in order to demonstrate clinically significant improvement in balance and decreased risk for falls  Baseline: 9 Goal status: INITIAL  3.  Pt will increase 2MWT by at least 80 ft in order to demonstrate clinically significant improvement in community ambulation Baseline: 224 ft Goal status: INITIAL  4.  Pt will demonstrate increase in LE strength to 4 to facilitate ease and safety in ambulation Baseline: 2+ Goal status: INITIAL  5.  Pt will demonstrate improved LE coordination as manifested by performing 3/4 correct trials in heel-to-shin to facilitate ease in ambulation Baseline: impaired Goal status: INITIAL  6.  Pt will be able to walk with a quad cane for > 100 ft Baseline: walks with a rollator Goal status: INITIAL   PLAN:  PT FREQUENCY: 2x/week  PT DURATION: 8 weeks  PLANNED INTERVENTIONS: Therapeutic exercises, Therapeutic activity, Neuromuscular re-education, Balance training, Gait training, Patient/Family education, Self Care, and Stair training  PLAN FOR NEXT SESSION: May begin LE  strengthening, flexibility, coordination, gait and balance exercises. Provide HEP.   Harvie Heck. Jamarrion Budai, PT, DPT, OCS Board-Certified Clinical Specialist in [place] # ([place]): [phone] T 06/15/2022, 5:14 PM

## 2022-06-18 ENCOUNTER — Ambulatory Visit (HOSPITAL_COMMUNITY): Payer: Medicare Other

## 2022-06-18 ENCOUNTER — Encounter (HOSPITAL_COMMUNITY): Payer: Self-pay

## 2022-06-18 DIAGNOSIS — R262 Difficulty in walking, not elsewhere classified: Secondary | ICD-10-CM | POA: Diagnosis not present

## 2022-06-18 DIAGNOSIS — M25551 Pain in right hip: Secondary | ICD-10-CM

## 2022-06-18 NOTE — Therapy (Signed)
OUTPATIENT PHYSICAL THERAPY LOWER EXTREMITY TREATMENT   Patient Name: [name] MRN: [id number] DOB:[birth year], 57 y.o., female Today's Date: 06/18/2022  END OF SESSION:  PT End of Session - 06/18/22 1321     Visit Number 2    Number of Visits 16    Date for PT Re-Evaluation 08/10/22    Authorization Type BCBS Medicare, Medicaid of [place]    Progress Note Due on Visit 8    PT Start Time 1305    PT Stop Time 1345    PT Time Calculation (min) 40 min    Equipment Utilized During Treatment Gait belt;Oxygen    Activity Tolerance Patient limited by fatigue    Behavior During Therapy [facility] for tasks assessed/performed             Past Medical History:  Diagnosis Date   Anemia    Anxiety    Arthritis    Asthma    Cervical radiculopathy    Cluster headaches    COPD (chronic obstructive pulmonary disease) (HCC)    COPD (chronic obstructive pulmonary disease) with chronic bronchitis    GERD (gastroesophageal reflux disease)    Hyperlipemia    Hypoglycemia    Hypothyroidism    Migraine    Pneumothorax    PONV (postoperative nausea and vomiting)    Pre-diabetes    Rectal discharge 07/28/2010   Shortness of breath    Sleep apnea    cannot tolerate-not using CPAP   Sluggishness 07/28/2010   Past Surgical History:  Procedure Laterality Date   ABDOMINAL HYSTERECTOMY     with bladder suspension   CERVICAL FUSION     CHOLECYSTECTOMY     COLONOSCOPY N/A 07/30/2013   Procedure: COLONOSCOPY;  Surgeon: Rogene Houston, MD;  [facility];  Service: Endoscopy;  Laterality: N/A;  [place] OF SHOULDER Right 10/11/2016   Procedure: EXAM UNDER ANESTHESIA WITH MANIPULATION OF SHOULDER;  Surgeon: Carole Civil, MD;  Location: [facility] ORS;  Service: Orthopedics;  Laterality: Right;   HERNIA REPAIR     INCISIONAL HERNIA REPAIR N/A 07/30/2016   Procedure: HERNIA REPAIR INCISIONAL WITH MESH;  Surgeon: Aviva Signs, MD;  Location: [facility]  ORS;  Service: General;  Laterality: N/A;   INTERSTIM IMPLANT PLACEMENT  02/02/2021   Procedure: [place] IMPLANT FIRST STAGE LEFT SACRUM ;  Surgeon: Cleon Gustin, MD;  Location: [facility] ORS;  Service: Urology;;   Barrie [place]yme IMPLANT PLACEMENT N/A 02/23/2021   Procedure: Barrie L[place] IMPLANT SECOND STAGE;  Surgeon: Cleon Gustin, MD;  Location: [facility];  Service: Urology;  Laterality: N/A;  Rep coming, time needs to stay at 2:00   [place]ob[place]     right lung    POSTERIOR CERVICAL LAMINECTOMY Right 04/05/2017   Procedure: Right C6-7 Posterior cervical laminectomy;  Surgeon: Kary Kos, MD;  Location: [place];  Service: Neurosurgery;  Laterality: Right;  Right C6-7 Posterior cervical laminectomy   SHOULDER ARTHROSCOPY WITH ROTATOR CUFF REPAIR Right 10/11/2016   Procedure: SHOULDER ARTHROSCOPY;  Surgeon: Carole Civil, MD;  Location: [facility];  Service: Orthopedics;  Laterality: Right;   TOTAL HIP ARTHROPLASTY Right 04/15/2020   Procedure: RIGHT TOTAL HIP ARTHROPLASTY ANTERIOR APPROACH;  Surgeon: Mcarthur Rossetti, MD;  Location: [facility];  Service: Orthopedics;  Laterality: Right;   TOTAL HIP ARTHROPLASTY Left 07/22/2020   Procedure: LEFT  HIP ARTHROPLASTY ANTERIOR APPROACH;  Surgeon: Mcarthur Rossetti, MD;  Location: [facility];  Service:  Orthopedics;  Laterality: Left;  RNFA   TUBAL LIGATION     Patient Active Problem List   Diagnosis Date Noted   Severe persistent asthma without complication [id number]   Gait abnormality 05/28/2022   Adrenal insufficiency ([place]) 03/30/2022   Endocrine disorder, unspecified 07/04/2021   Prediabetes 07/04/2021   Hypothyroidism 07/04/2021   Former smoker 06/29/2021   Seasonal and perennial allergic rhinitis 06/19/2021   Oxygen dependent 06/19/2021   Steroid dependent ([place]) 06/19/2021   Immunosuppressed status ([place]) 06/19/2021   Radiculopathy, lumbar region 12/08/2020   Status post left hip replacement 07/22/2020   Status  post right hip replacement 05/02/2020   Status post total replacement of right hip 04/15/2020   Avascular necrosis of bone of left hip ([place]) 02/10/2020   Avascular necrosis of bone of right hip ([place]) 02/10/2020   Allergic rhinitis 05/21/2019   Asthma-COPD overlap syndrome 04/30/2019   Chronic respiratory failure with hypoxia ([place]) 04/30/2019   Spinal stenosis of cervical region 04/05/2017   Partial tear of right subscapularis tendon    Incisional hernia, without obstruction or gangrene    Urinary frequency 03/21/2012   Migraine equivalent syndrome 03/21/2012   Insomnia 03/21/2012    PCP: Celene Squibb, MD  REFERRING PROVIDER: Marcial Pacas, MD  REFERRING DIAG: R26.9 (ICD-10-CM) - Gait abnormality  THERAPY DIAG:  Difficulty in walking, not elsewhere classified  Pain in right hip  Rationale for Evaluation and Treatment: Rehabilitation  ONSET DATE: April 12, 2022  SUBJECTIVE:   SUBJECTIVE STATEMENT: Pt reports feeling fine today. Gave brief introduction to primary issues for PT referral. Along with biggest concern of waving sensation pt is experiencing when walking.   PERTINENT HISTORY: Bladder implant (will have surgery 07/26/2022), hx of B THR, lobectomy PAIN:  Are you having pain? No  PRECAUTIONS: Fall and Other: uses portable O2  WEIGHT BEARING RESTRICTIONS: No  FALLS:  Has patient fallen in last 6 months? Yes. Number of falls 7  LIVING ENVIRONMENT: Lives with: lives with their spouse Lives in: House/apartment Stairs: Yes: External: 1 steps; none Has following equipment at home: Single point cane, Walker - 2 wheeled, Environmental consultant - 4 wheeled, shower chair, bed side commode, and oxygen  OCCUPATION: on disability  PLOF: Independent with household mobility with device and Requires assistive device for independence  PATIENT GOALS: "to be able to walk straight by myself"  NEXT MD VISIT: Aug 30, 2022  OBJECTIVE:   DIAGNOSTIC FINDINGS:  MRI HEAD WITHOUT CONTRAST  06/08/22   TECHNIQUE: Multiplanar, multiecho pulse sequences of the brain and surrounding structures were obtained without intravenous contrast.   COMPARISON:  Head CT 05/22/2022. Brain MRI 05/22/2011.   FINDINGS: Brain:   No age advanced or lobar predominant parenchymal atrophy.   Multifocal T2 FLAIR hyperintense signal abnormality within the cerebral white matter, overall mild but greater than expected for age.   There is no acute infarct.   No evidence of an intracranial mass.   No chronic intracranial blood products.   No extra-axial fluid collection.   No midline shift.   Vascular: Maintained flow voids within the proximal large arterial vessels.   Skull and upper cervical spine: No focal suspicious calvarial marrow lesion. Cervical spine findings separately reported on same day cervical spine MRI.   Sinuses/Orbits: No mass or acute finding within the imaged orbits. Prior bilateral ocular lens replacement. Trace mucosal thickening within left ethmoid air cells.   Other: Small-volume fluid within the bilateral mastoid air cells.   IMPRESSION: 1.  No evidence of  an acute intracranial abnormality. 2. Multifocal T2 FLAIR hyperintense signal abnormality within the cerebral white matter, overall mild but greater than expected for age. These signal changes have slightly progressed from the prior brain MRI of 05/22/2011. Findings are nonspecific and differential considerations include chronic small vessel ischemic disease, sequelae of chronic migraine headaches, sequelae of a prior infectious/inflammatory process or sequelae of a demyelinating process (such as multiple sclerosis), among others. No lesions specifically suggest demyelinating disease. 3. Small-volume fluid within the bilateral mastoid air cells.  MRI CERVICAL SPINE WITHOUT CONTRAST 06/08/22   TECHNIQUE: Multiplanar, multisequence MR imaging of the cervical spine was performed. No intravenous contrast  was administered.   COMPARISON:  Radiographs of the cervical spine 08/06/2017. Cervical spine MRI 02/24/2017.   FINDINGS: Alignment: Straightening of the expected cervical lordosis. 3 mm C7-T1 grade 1 anterolisthesis.   Vertebrae: Vertebral body height is maintained. Susceptibility artifact arising from ACDF hardware at the C4-C7 levels. No appreciable significant marrow edema or focal suspicious osseous lesion.   Cord: No signal abnormality identified within the cervical spinal cord.   Posterior Fossa, vertebral arteries, paraspinal tissues: Posterior fossa assessed on same-day brain MRI. Flow voids preserved within the imaged cervical vertebral arteries. No paraspinal mass or collection.   Disc levels:   Unless otherwise stated, the level by level findings below have not significantly changed from the prior MRI of 03/06/2017.   Disc degeneration at the non-operative levels, greatest at C3-C4 and C7-T1 (progressed at these levels).   C2-C3: Slight disc bulge. No significant spinal canal or foraminal stenosis.   C3-C4: Disc bulge. New superimposed small central disc protrusion (at site of posterior annular fissure). Uncovertebral hypertrophy on the right, new from the prior MRI. Facet arthrosis. Mild relative spinal canal narrowing (without spinal cord mass effect), new from the prior MRI. Moderate right neural foraminal narrowing, also new from the prior MRI.   C4-C5: Prior ACDF. No significant spinal canal or foraminal stenosis.   C5-C6: Prior ACDF. Uncovertebral hypertrophy on the right. No significant spinal canal stenosis. Mild right neural foraminal narrowing.   C6-C7: Prior ACDF. Uncovertebral vertebrae on the right. No significant spinal canal stenosis. Moderate right neural foraminal narrowing.   C7-T1: 3 mm grade 1 anterolisthesis. Mild disc uncovering. Facet arthrosis. No significant spinal canal stenosis. Mild-to-moderate bilateral neural foraminal  narrowing.   IMPRESSION: Prior C4-C7 ACDF. No significant spinal canal stenosis at these levels. As before, uncovertebral hypertrophy results in neural foraminal narrowing on the right at C5-C6 (mild), and on the right at C6-C7 (moderate).   Cervical spondylosis at the remaining levels, as outlined and with findings most notably as follows.   At C3-C4, there is a disc bulge. New superimposed small central disc protrusion (at site of posterior annular fissure). Uncovertebral hypertrophy on the right, new from the prior MRI. Facet arthrosis. Mild spinal canal narrowing, and moderate right neural foraminal narrowing, new from the prior MRI.   At C7-T1, there is 3 mm grade 1 anterolisthesis. Mild disc uncovering. Facet arthrosis. No significant spinal canal stenosis. Mild-to-moderate bilateral neural foraminal narrowing. Findings at this level have not significantly changed.  COGNITION: Overall cognitive status: Within functional limits for tasks assessed     SENSATION: Not tested  MUSCLE LENGTH: Mild restriction on B hamstrings Moderate restriction on B gastrocsoleus  POSTURE: rounded shoulders and forward head   LOWER EXTREMITY MMT  MMT Right eval Left eval  Hip flexion 4 4  Hip extension 2+ (bridging) 2+  (bridging)  Hip abduction 4 4  Hip adduction    Hip internal rotation    Hip external rotation    Knee flexion 4- 4-  Knee extension 4- 3+  Ankle dorsiflexion 3+ 4-  Ankle plantarflexion 3+ 3+  Ankle inversion    Ankle eversion     (Blank rows = not tested)  LOWER EXTREMITY ROM  Active ROM Right eval Left eval  Hip flexion [facility]  Hip extension    Hip abduction [facility]  Hip adduction [facility]  Hip internal rotation    Hip external rotation    Knee flexion [facility] WFL  Knee extension [facility]  Ankle dorsiflexion [facility] WFL  Ankle plantarflexion [facility] WFL  Ankle inversion    Ankle eversion     (Blank rows = not tested)  FUNCTIONAL TESTS:  5 times sit to  stand: 26.97 sec (has to hold on to rollator) 2 minute walk test: 224 ft (with a rollator) Tinetti POMA : 9 (balance + gait)  GAIT: Distance walked: 224 ft Assistive device utilized:  rollator Level of assistance: Modified independence Comments: decrease foot clearance on B, decreased step length on B   TODAY'S TREATMENT:                                                                                                                              DATE:  06/18/2022 -Education for pt's explanation of visual disturbances during walking; Attempted VOR assessment but limited Head ROM due to previous surgieies.  -Habituation exercises daily at home VOR x 1 3x daily. - 1 x 5  elevated dead lift with weighed balls with 10in box  - 2x 5Weighted sit/stands from elevated mat with yellow weighted ball.  2  06/15/22 Evaluation and patient education done    PATIENT EDUCATION:  Education details: Educated on the pathophysiology of weakness. Educated on the goals and course of rehab. Educated on falls risk reduction at home Person educated: Patient Education method: Explanation Education comprehension: verbalized understanding  HOME EXERCISE PROGRAM: - VOR x 1 Habituation Exercises 3x daily - Sit/stands w/ 4.5lb weight at home 2 x 5 daily.  ASSESSMENT:  CLINICAL IMPRESSION: Pt tolerated first treatment session well with addressing visual/sensational experiences when ambulating. Educated pt on various reasons and explanation for Peripheral/Central vestibular information. Pt given VOR x 1 habituation exercises to attempt to reduce subjective experience to address visual/sensational issue. Pt given BLE functional strengthening for rest of session with attempted deadlift but too dynamic and complex for pt at current date. Regressed to weighted sit/stands and will attempt deadlift maneuver with more elevated surface to promote improved performance with functional tasks. Skilled PT is recommended to  continue address the impairments and functional limitations listed below.    OBJECTIVE IMPAIRMENTS: Abnormal gait, decreased balance, decreased coordination, decreased endurance, difficulty walking, decreased strength, impaired flexibility, and pain.   ACTIVITY LIMITATIONS: carrying, lifting, bending, standing, squatting, stairs, transfers, continence, bathing, toileting, dressing, self feeding, hygiene/grooming, and locomotion level  PARTICIPATION  LIMITATIONS: meal prep, cleaning, laundry, driving, shopping, community activity, and yard work  PERSONAL FACTORS: Fitness level, Bladder implant (will have surgery 07/26/2022), hx of B THR, lobectomy are also affecting patient's functional outcome.   REHAB POTENTIAL: Fair    CLINICAL DECISION MAKING: Evolving/moderate complexity  EVALUATION COMPLEXITY: Moderate   GOALS: Goals reviewed with patient? Yes  SHORT TERM GOALS: Target date: 07/13/2022  Pt will demonstrate indep in HEP to facilitate carry-over of skilled services and improve functional outcomes Goal status: INITIAL  LONG TERM GOALS: Target date: 08/10/2022  Pt will decrease 5TSTS by at least 6 seconds in order to demonstrate clinically significant improvement in LE strength  Baseline: 26.97 sec Goal status: INITIAL  2.  Patient will demonstrate increase in Tinetti Score by 8 points in order to demonstrate clinically significant improvement in balance and decreased risk for falls  Baseline: 9 Goal status: INITIAL  3.  Pt will increase 2MWT by at least 80 ft in order to demonstrate clinically significant improvement in community ambulation Baseline: 224 ft Goal status: INITIAL  4.  Pt will demonstrate increase in LE strength to 4 to facilitate ease and safety in ambulation Baseline: 2+ Goal status: INITIAL  5.  Pt will demonstrate improved LE coordination as manifested by performing 3/4 correct trials in heel-to-shin to facilitate ease in ambulation Baseline:  impaired Goal status: INITIAL  6.  Pt will be able to walk with a quad cane for > 100 ft Baseline: walks with a rollator Goal status: INITIAL   PLAN:  PT FREQUENCY: 2x/week  PT DURATION: 8 weeks  PLANNED INTERVENTIONS: Therapeutic exercises, Therapeutic activity, Neuromuscular re-education, Balance training, Gait training, Patient/Family education, Self Care, and Stair training  PLAN FOR NEXT SESSION: May begin LE strengthening, flexibility, coordination, gait and balance exercises. Provide HEP.   Harvie Heck. Adivino, PT, DPT, OCS Board-Certified Clinical Specialist in [place] # ([place]): [phone] T 06/18/2022, 1:50 PM

## 2022-06-19 ENCOUNTER — Telehealth: Payer: Self-pay | Admitting: Neurology

## 2022-06-19 DIAGNOSIS — R269 Unspecified abnormalities of gait and mobility: Secondary | ICD-10-CM

## 2022-06-19 NOTE — Telephone Encounter (Signed)
I don;t think there is anything I can add to Dr. Rhea Belton already thorough evaluation I woul dsend her to academic center in my opinion thanks

## 2022-06-19 NOTE — Telephone Encounter (Signed)
Agree with Dr. Jaynee Eagles, second option at a tertiary center.

## 2022-06-19 NOTE — Telephone Encounter (Signed)
I have forwarded her chart to Dr. Lenoria Farrier   see if other providers would take her, if not, I will refer her to [facility] neurologist for second opinion

## 2022-06-19 NOTE — Telephone Encounter (Signed)
Pt called stating that she is wanting to switch to another provider because she is still having her issues and she would like a second opinion. Please advise.

## 2022-06-20 NOTE — Telephone Encounter (Signed)
Orders Placed This Encounter  Procedures   Ambulatory referral to Neurology     Please let patient know, have consult other provider at office, suggest refer to academic center, [facility]

## 2022-06-20 NOTE — Addendum Note (Signed)
Addended by: Marcial Pacas on: 06/20/2022 02:24 PM   Modules accepted: Orders

## 2022-06-21 ENCOUNTER — Ambulatory Visit (HOSPITAL_COMMUNITY): Payer: Medicare Other

## 2022-06-21 DIAGNOSIS — R262 Difficulty in walking, not elsewhere classified: Secondary | ICD-10-CM

## 2022-06-21 NOTE — Telephone Encounter (Signed)
I called patient.  I had a conversation with her greater than 5 minutes explaining that our physicians reviewed her chart and recommended a tertiary care center for further neurological care.  I advised her the referral will be sent to [facility].  Patient asked about the MRI results be sent to Dr. Nevada Crane.  Patient is agreeable to the referral to [facility] as long as her insurance covers it.  She will let us know of interim questions or concerns.

## 2022-06-21 NOTE — Therapy (Signed)
OUTPATIENT PHYSICAL THERAPY LOWER EXTREMITY TREATMENT   Patient Name: [name] MRN: [id number] DOB:[birth year], 57 y.o., female Today's Date: 06/21/2022  END OF SESSION:  PT End of Session - 06/21/22 1411     Visit Number 3    Number of Visits 16    Date for PT Re-Evaluation 08/10/22    Authorization Type BCBS Medicare, Medicaid of [place]    Progress Note Due on Visit 8    PT Start Time 1300    PT Stop Time 1345    PT Time Calculation (min) 45 min    Activity Tolerance Patient tolerated treatment well    Behavior During Therapy WFL for tasks assessed/performed            Past Medical History:  Diagnosis Date   Anemia    Anxiety    Arthritis    Asthma    Cervical radiculopathy    Cluster headaches    COPD (chronic obstructive pulmonary disease) (HCC)    COPD (chronic obstructive pulmonary disease) with chronic bronchitis    GERD (gastroesophageal reflux disease)    Hyperlipemia    Hypoglycemia    Hypothyroidism    Migraine    Pneumothorax    PONV (postoperative nausea and vomiting)    Pre-diabetes    Rectal discharge 07/28/2010   Shortness of breath    Sleep apnea    cannot tolerate-not using CPAP   Sluggishness 07/28/2010   Past Surgical History:  Procedure Laterality Date   ABDOMINAL HYSTERECTOMY     with bladder suspension   CERVICAL FUSION     CHOLECYSTECTOMY     COLONOSCOPY N/A 07/30/2013   Procedure: COLONOSCOPY;  Surgeon: Rogene Houston, MD;  [facility];  Service: Endoscopy;  Laterality: N/A;  [place] OF SHOULDER Right 10/11/2016   Procedure: EXAM UNDER ANESTHESIA WITH MANIPULATION OF SHOULDER;  Surgeon: Carole Civil, MD;  Location: [facility];  Service: Orthopedics;  Laterality: Right;   HERNIA REPAIR     INCISIONAL HERNIA REPAIR N/A 07/30/2016   Procedure: HERNIA REPAIR INCISIONAL WITH MESH;  Surgeon: Aviva Signs, MD;  Location: [facility];  Service: General;  Laterality: N/A;   INTERSTIM  IMPLANT PLACEMENT  02/02/2021   Procedure: Barrie [place]yme IMPLANT FIRST STAGE LEFT SACRUM ;  Surgeon: Cleon Gustin, MD;  Location: [facility];  Service: Urology;;   Barrie Lyme IMPLANT PLACEMENT N/A 02/23/2021   Procedure: Barrie Lyme IMPLANT SECOND STAGE;  Surgeon: Cleon Gustin, MD;  Location: [facility];  Service: Urology;  Laterality: N/A;  Rep coming, time needs to stay at 2:00   [place] [facility]     right lung    POSTERIOR CERVICAL LAMINECTOMY Right 04/05/2017   Procedure: Right C6-7 Posterior cervical laminectomy;  Surgeon: Kary Kos, MD;  Location: [place];  Service: Neurosurgery;  Laterality: Right;  Right C6-7 Posterior cervical laminectomy   SHOULDER ARTHROSCOPY WITH ROTATOR CUFF REPAIR Right 10/11/2016   Procedure: SHOULDER ARTHROSCOPY;  Surgeon: Carole Civil, MD;  Location: [facility];  Service: Orthopedics;  Laterality: Right;   TOTAL HIP ARTHROPLASTY Right 04/15/2020   Procedure: RIGHT TOTAL HIP ARTHROPLASTY ANTERIOR APPROACH;  Surgeon: Mcarthur Rossetti, MD;  Location: [facility];  Service: Orthopedics;  Laterality: Right;   TOTAL HIP ARTHROPLASTY Left 07/22/2020   Procedure: LEFT  HIP ARTHROPLASTY ANTERIOR APPROACH;  Surgeon: Mcarthur Rossetti, MD;  Location: [facility];  Service: Orthopedics;  Laterality: Left;  RNFA   TUBAL LIGATION  Patient Active Problem List   Diagnosis Date Noted   Severe persistent asthma without complication [id number]   Gait abnormality 05/28/2022   Adrenal insufficiency ([place]) 03/30/2022   Endocrine disorder, unspecified 07/04/2021   Prediabetes 07/04/2021   Hypothyroidism 07/04/2021   Former smoker 06/29/2021   Seasonal and perennial allergic rhinitis 06/19/2021   Oxygen dependent 06/19/2021   Steroid dependent ([place]) 06/19/2021   Immunosuppressed status ([place]) 06/19/2021   Radiculopathy, lumbar region 12/08/2020   Status post left hip replacement 07/22/2020   Status post right hip replacement 05/02/2020   Status post  total replacement of right hip 04/15/2020   Avascular necrosis of bone of left hip ([place]) 02/10/2020   Avascular necrosis of bone of right hip ([place]) 02/10/2020   Allergic rhinitis 05/21/2019   Asthma-COPD overlap syndrome 04/30/2019   Chronic respiratory failure with hypoxia ([place]) 04/30/2019   Spinal stenosis of cervical region 04/05/2017   Partial tear of right subscapularis tendon    Incisional hernia, without obstruction or gangrene    Urinary frequency 03/21/2012   Migraine equivalent syndrome 03/21/2012   Insomnia 03/21/2012    PCP: Celene Squibb, MD  REFERRING PROVIDER: Marcial Pacas, MD  REFERRING DIAG: R26.9 (ICD-10-CM) - Gait abnormality  THERAPY DIAG:  Difficulty in walking, not elsewhere classified - Plan: PT plan of care cert/re-cert  Rationale for Evaluation and Treatment: Rehabilitation  ONSET DATE: April 12, 2022  SUBJECTIVE:   SUBJECTIVE STATEMENT: Patient states that his neurologist is referring her to another neurologist. Also reports of constant neck pain = 2/10. Wants to come 3x/week for PT and wants to postpone bladder implant surgery because she wants to focus on her balance. Claims that she still feels the "wave" in her head.  PERTINENT HISTORY: Bladder implant (will have surgery 07/26/2022), hx of B THR, lobectomy PAIN:  Are you having pain? No  PRECAUTIONS: Fall and Other: uses portable O2  WEIGHT BEARING RESTRICTIONS: No  FALLS:  Has patient fallen in last 6 months? Yes. Number of falls 7  LIVING ENVIRONMENT: Lives with: lives with their spouse Lives in: House/apartment Stairs: Yes: External: 1 steps; none Has following equipment at home: Single point cane, Walker - 2 wheeled, Environmental consultant - 4 wheeled, shower chair, bed side commode, and oxygen  OCCUPATION: on disability  PLOF: Independent with household mobility with device and Requires assistive device for independence  PATIENT GOALS: "to be able to walk straight by myself"  NEXT MD VISIT: Aug 30, 2022  OBJECTIVE:   DIAGNOSTIC FINDINGS:  MRI HEAD WITHOUT CONTRAST 06/08/22   TECHNIQUE: Multiplanar, multiecho pulse sequences of the brain and surrounding structures were obtained without intravenous contrast.   COMPARISON:  Head CT 05/22/2022. Brain MRI 05/22/2011.   FINDINGS: Brain:   No age advanced or lobar predominant parenchymal atrophy.   Multifocal T2 FLAIR hyperintense signal abnormality within the cerebral white matter, overall mild but greater than expected for age.   There is no acute infarct.   No evidence of an intracranial mass.   No chronic intracranial blood products.   No extra-axial fluid collection.   No midline shift.   Vascular: Maintained flow voids within the proximal large arterial vessels.   Skull and upper cervical spine: No focal suspicious calvarial marrow lesion. Cervical spine findings separately reported on same day cervical spine MRI.   Sinuses/Orbits: No mass or acute finding within the imaged orbits. Prior bilateral ocular lens replacement. Trace mucosal thickening within left ethmoid air cells.   Other: Small-volume fluid within the bilateral  mastoid air cells.   IMPRESSION: 1.  No evidence of an acute intracranial abnormality. 2. Multifocal T2 FLAIR hyperintense signal abnormality within the cerebral white matter, overall mild but greater than expected for age. These signal changes have slightly progressed from the prior brain MRI of 05/22/2011. Findings are nonspecific and differential considerations include chronic small vessel ischemic disease, sequelae of chronic migraine headaches, sequelae of a prior infectious/inflammatory process or sequelae of a demyelinating process (such as multiple sclerosis), among others. No lesions specifically suggest demyelinating disease. 3. Small-volume fluid within the bilateral mastoid air cells.  MRI CERVICAL SPINE WITHOUT CONTRAST 06/08/22   TECHNIQUE: Multiplanar, multisequence  MR imaging of the cervical spine was performed. No intravenous contrast was administered.   COMPARISON:  Radiographs of the cervical spine 08/06/2017. Cervical spine MRI 02/24/2017.   FINDINGS: Alignment: Straightening of the expected cervical lordosis. 3 mm C7-T1 grade 1 anterolisthesis.   Vertebrae: Vertebral body height is maintained. Susceptibility artifact arising from ACDF hardware at the C4-C7 levels. No appreciable significant marrow edema or focal suspicious osseous lesion.   Cord: No signal abnormality identified within the cervical spinal cord.   Posterior Fossa, vertebral arteries, paraspinal tissues: Posterior fossa assessed on same-day brain MRI. Flow voids preserved within the imaged cervical vertebral arteries. No paraspinal mass or collection.   Disc levels:   Unless otherwise stated, the level by level findings below have not significantly changed from the prior MRI of 03/06/2017.   Disc degeneration at the non-operative levels, greatest at C3-C4 and C7-T1 (progressed at these levels).   C2-C3: Slight disc bulge. No significant spinal canal or foraminal stenosis.   C3-C4: Disc bulge. New superimposed small central disc protrusion (at site of posterior annular fissure). Uncovertebral hypertrophy on the right, new from the prior MRI. Facet arthrosis. Mild relative spinal canal narrowing (without spinal cord mass effect), new from the prior MRI. Moderate right neural foraminal narrowing, also new from the prior MRI.   C4-C5: Prior ACDF. No significant spinal canal or foraminal stenosis.   C5-C6: Prior ACDF. Uncovertebral hypertrophy on the right. No significant spinal canal stenosis. Mild right neural foraminal narrowing.   C6-C7: Prior ACDF. Uncovertebral vertebrae on the right. No significant spinal canal stenosis. Moderate right neural foraminal narrowing.   C7-T1: 3 mm grade 1 anterolisthesis. Mild disc uncovering. Facet arthrosis. No  significant spinal canal stenosis. Mild-to-moderate bilateral neural foraminal narrowing.   IMPRESSION: Prior C4-C7 ACDF. No significant spinal canal stenosis at these levels. As before, uncovertebral hypertrophy results in neural foraminal narrowing on the right at C5-C6 (mild), and on the right at C6-C7 (moderate).   Cervical spondylosis at the remaining levels, as outlined and with findings most notably as follows.   At C3-C4, there is a disc bulge. New superimposed small central disc protrusion (at site of posterior annular fissure). Uncovertebral hypertrophy on the right, new from the prior MRI. Facet arthrosis. Mild spinal canal narrowing, and moderate right neural foraminal narrowing, new from the prior MRI.   At C7-T1, there is 3 mm grade 1 anterolisthesis. Mild disc uncovering. Facet arthrosis. No significant spinal canal stenosis. Mild-to-moderate bilateral neural foraminal narrowing. Findings at this level have not significantly changed.  COGNITION: Overall cognitive status: Within functional limits for tasks assessed     SENSATION: Not tested  MUSCLE LENGTH: Mild restriction on B hamstrings Moderate restriction on B gastrocsoleus  POSTURE: rounded shoulders and forward head   LOWER EXTREMITY MMT  MMT Right eval Left eval  Hip flexion 4 4  Hip  extension 2+ (bridging) 2+  (bridging)  Hip abduction 4 4  Hip adduction    Hip internal rotation    Hip external rotation    Knee flexion 4- 4-  Knee extension 4- 3+  Ankle dorsiflexion 3+ 4-  Ankle plantarflexion 3+ 3+  Ankle inversion    Ankle eversion     (Blank rows = not tested)  LOWER EXTREMITY ROM  Active ROM Right eval Left eval  Hip flexion [facility]  Hip extension    Hip abduction [facility]  Hip adduction [facility]  Hip internal rotation    Hip external rotation    Knee flexion [facility] WFL  Knee extension [facility]  Ankle dorsiflexion [facility] WFL  Ankle plantarflexion [facility] WFL  Ankle inversion     Ankle eversion     (Blank rows = not tested)  FUNCTIONAL TESTS:  5 times sit to stand: 26.97 sec (has to hold on to rollator) 2 minute walk test: 224 ft (with a rollator) Tinetti POMA : 9 (balance + gait)  GAIT: Distance walked: 224 ft Assistive device utilized:  rollator Level of assistance: Modified independence Comments: decrease foot clearance on B, decreased step length on B  VESTIBULAR ASSESSMENT (06/21/2022):  Head impulse test: positive with corrective saccades, dizziness reported  VOR 1: corrective saccades, dizziness reported  Optokinetic reflex (R/L): impaired, dizziness reported  TODAY'S TREATMENT:                                                                                                                              DATE:  06/21/2022 Seated:  Cervical neck AROM flex/ext, rot x 10  Horizontal saccades, while identifying numbers x 1'  Vertical saccades, while reading words x 1'  Horizontal smooth pursuit, identifying animal silhouettes x 1'  Vertical smooth pursuit, identifying colors/shapes x 1'  VOR 1, 60 bpm, identifying numbers x 1'  VOR cancellation, reading words x 1'  06/18/2022 -Education for pt's explanation of visual disturbances during walking; Attempted VOR assessment but limited Head ROM due to previous surgieies.  -Habituation exercises daily at home VOR x 1 3x daily. - 1 x 5  elevated dead lift with weighed balls with 10in box  - 2x 5Weighted sit/stands from elevated mat with yellow weighted ball.  2  06/15/22 Evaluation and patient education done    PATIENT EDUCATION:  Education details: Updated HEP. Instructed patient to discuss her plans of postponing bladder implant surgery with her surgeon Person educated: Patient Education method: Explanation Education comprehension: verbalized understanding  HOME EXERCISE PROGRAM:  06/21/2022 GAZE STABILIZATION EXERCISES (2-3x/day, 5-7 days/week, seated) These exercises are designed to improve your  eyes' ability to track objects without getting dizzy. When performing these exercises, make sure you are facing a blank wall (like a white wall) to avoid distraction. You can wear your glasses/contact lenses if you must.   Before anything, warm-up your neck muscles by bending it forward and back 10 times, side-to-side 10  times, and slowly turn your head left and right 10 time Exercise 1 (Horizontal Saccades) Use two cards for this exercise While holding the cards on each hand side by side in sitting with arms outstretched, look at the first item on the other card then look at the next item on the second card then alternate your gaze sequentially. Do NOT move your cards nor the your head. Do the following for one minute each Identifying numbers Identifying shapes Exercise 2 (Paradigm x1) Use only one card for this exercise While holding the card in sitting with your arm outstretched, turn your head left and right at your own pace while keeping the card stationary.  Do this for one minute each while you are: Identifying numbers Identifying shapes Exercise 3 (VOR cancellation) Use only one card for this exercise While holding the card in sitting with your arms outstretched, turn your body, arm, and head at the same time left and right at your own pace Do this for one minute each while you are: Identifying numbers Identifying shapes  3/11/20204 - VOR x 1 Habituation Exercises 3x daily - Sit/stands w/ 4.5lb weight at home 2 x 5 daily.  ASSESSMENT:  CLINICAL IMPRESSION: Interventions today were geared towards gaze stabilization based on the results of vestibular assessment done today. Tolerated gaze stabilization activities with slight dizziness. Demonstrated appropriate levels of fatigue. Rest periods provided. Provided moderate amount of cueing to ensure correct execution of activity with fair to good carry-over. To date, skilled PT is required to address the impairments and improve  function. Progressed frequency to 3x/week per patient request.  OBJECTIVE IMPAIRMENTS: Abnormal gait, decreased balance, decreased coordination, decreased endurance, difficulty walking, decreased strength, impaired flexibility, and pain.   ACTIVITY LIMITATIONS: carrying, lifting, bending, standing, squatting, stairs, transfers, continence, bathing, toileting, dressing, self feeding, hygiene/grooming, and locomotion level  PARTICIPATION LIMITATIONS: meal prep, cleaning, laundry, driving, shopping, community activity, and yard work  PERSONAL FACTORS: Fitness level, Bladder implant (will have surgery 07/26/2022), hx of B THR, lobectomy are also affecting patient's functional outcome.   REHAB POTENTIAL: Fair    CLINICAL DECISION MAKING: Evolving/moderate complexity  EVALUATION COMPLEXITY: Moderate   GOALS: Goals reviewed with patient? Yes  SHORT TERM GOALS: Target date: 07/13/2022  Pt will demonstrate indep in HEP to facilitate carry-over of skilled services and improve functional outcomes Goal status: INITIAL  LONG TERM GOALS: Target date: 08/10/2022  Pt will decrease 5TSTS by at least 6 seconds in order to demonstrate clinically significant improvement in LE strength  Baseline: 26.97 sec Goal status: INITIAL  2.  Patient will demonstrate increase in Tinetti Score by 8 points in order to demonstrate clinically significant improvement in balance and decreased risk for falls  Baseline: 9 Goal status: INITIAL  3.  Pt will increase 2MWT by at least 80 ft in order to demonstrate clinically significant improvement in community ambulation Baseline: 224 ft Goal status: INITIAL  4.  Pt will demonstrate increase in LE strength to 4 to facilitate ease and safety in ambulation Baseline: 2+ Goal status: INITIAL  5.  Pt will demonstrate improved LE coordination as manifested by performing 3/4 correct trials in heel-to-shin to facilitate ease in ambulation Baseline: impaired Goal status:  INITIAL  6.  Pt will be able to walk with a quad cane for > 100 ft Baseline: walks with a rollator Goal status: INITIAL   PLAN:  PT FREQUENCY: 3x/week  PT DURATION: 8 weeks  PLANNED INTERVENTIONS: Therapeutic exercises, Therapeutic activity, Neuromuscular re-education, Balance training, Gait  training, Patient/Family education, Self Care, and Stair training  PLAN FOR NEXT SESSION: Continue POC and may progress as tolerated with emphasis on gaze stabilization, LE strengthening, flexibility, coordination, gait and balance exercises. Provide HEP.   Harvie Heck. Mariaelena Cade, PT, DPT, OCS Board-Certified Clinical Specialist in [place] # ([place]): [phone] T 06/21/2022, 2:12 PM

## 2022-06-22 ENCOUNTER — Ambulatory Visit: Payer: Medicare Other | Admitting: Diagnostic Neuroimaging

## 2022-06-25 ENCOUNTER — Telehealth: Payer: Self-pay

## 2022-06-25 NOTE — Telephone Encounter (Signed)
Received my chart message to call patient.  I spoke with [name] who reports balance issues and falling recently. Patient currently in therapy and wishes to post pone interstim revision surgery. Patient will reach back out to the office once she has completed therapy.

## 2022-06-26 ENCOUNTER — Telehealth: Payer: Self-pay | Admitting: Neurology

## 2022-06-26 NOTE — Telephone Encounter (Signed)
Referral for neurology sent [place] to [facility]

## 2022-06-27 ENCOUNTER — Ambulatory Visit (HOSPITAL_COMMUNITY): Payer: Medicare Other

## 2022-06-27 DIAGNOSIS — R262 Difficulty in walking, not elsewhere classified: Secondary | ICD-10-CM | POA: Diagnosis not present

## 2022-06-27 NOTE — Therapy (Signed)
OUTPATIENT PHYSICAL THERAPY LOWER EXTREMITY TREATMENT   Patient Name: [name] MRN: [id number] DOB:11-10-1965, 57 y.o., female Today's Date: 06/27/2022  END OF SESSION:  PT End of Session - 06/27/22 1529     Visit Number 4    Number of Visits 16    Date for PT Re-Evaluation 08/10/22    Authorization Type BCBS Medicare, Medicaid of     Progress Note Due on Visit 8    PT Start Time 1430    PT Stop Time 1510    PT Time Calculation (min) 40 min    Equipment Utilized During Treatment Gait belt;Oxygen    Activity Tolerance Patient tolerated treatment well    Behavior During Therapy WFL for tasks assessed/performed            Past Medical History:  Diagnosis Date   Anemia    Anxiety    Arthritis    Asthma    Cervical radiculopathy    Cluster headaches    COPD (chronic obstructive pulmonary disease) (HCC)    COPD (chronic obstructive pulmonary disease) with chronic bronchitis    GERD (gastroesophageal reflux disease)    Hyperlipemia    Hypoglycemia    Hypothyroidism    Migraine    Pneumothorax    PONV (postoperative nausea and vomiting)    Pre-diabetes    Rectal discharge 07/28/2010   Shortness of breath    Sleep apnea    cannot tolerate-not using CPAP   Sluggishness 07/28/2010   Past Surgical History:  Procedure Laterality Date   ABDOMINAL HYSTERECTOMY     with bladder suspension   CERVICAL FUSION     CHOLECYSTECTOMY     COLONOSCOPY N/A 07/30/2013   Procedure: COLONOSCOPY;  Surgeon: Rogene Houston, MD;  [facility];  Service: Endoscopy;  Laterality: N/A;  [place] OF SHOULDER Right 10/11/2016   Procedure: EXAM UNDER ANESTHESIA WITH MANIPULATION OF SHOULDER;  Surgeon: Carole Civil, MD;  Location: [facility];  Service: Orthopedics;  Laterality: Right;   HERNIA REPAIR     INCISIONAL HERNIA REPAIR N/A 07/30/2016   Procedure: HERNIA REPAIR INCISIONAL WITH MESH;  Surgeon: Aviva Signs, MD;  Location:  [facility];  Service: General;  Laterality: N/A;   INTERSTIM IMPLANT PLACEMENT  02/02/2021   Procedure: [place] IMPLANT FIRST STAGE LEFT SACRUM ;  Surgeon: Cleon Gustin, MD;  Location: [facility];  Service: Urology;;   [place]rie [place]yme IMPLANT PLACEMENT N/A 02/23/2021   Procedure: [place]rie [place] IMPLANT SECOND STAGE;  Surgeon: Cleon Gustin, MD;  Location: [facility];  Service: Urology;  Laterality: N/A;  Rep coming, time needs to stay at 2:00   [place]     right lung    POSTERIOR CERVICAL LAMINECTOMY Right 04/05/2017   Procedure: Right C6-7 Posterior cervical laminectomy;  Surgeon: Kary Kos, MD;  Location: [facility];  Service: Neurosurgery;  Laterality: Right;  Right C6-7 Posterior cervical laminectomy   SHOULDER ARTHROSCOPY WITH ROTATOR CUFF REPAIR Right 10/11/2016   Procedure: SHOULDER ARTHROSCOPY;  Surgeon: Carole Civil, MD;  Location: [facility];  Service: Orthopedics;  Laterality: Right;   TOTAL HIP ARTHROPLASTY Right 04/15/2020   Procedure: RIGHT TOTAL HIP ARTHROPLASTY ANTERIOR APPROACH;  Surgeon: Mcarthur Rossetti, MD;  Location: [facility];  Service: Orthopedics;  Laterality: Right;   TOTAL HIP ARTHROPLASTY Left 07/22/2020   Procedure: LEFT  HIP ARTHROPLASTY ANTERIOR APPROACH;  Surgeon: Mcarthur Rossetti, MD;  Location: [facility];  Service: Orthopedics;  Laterality: Left;  RNFA   TUBAL LIGATION     Patient Active Problem List   Diagnosis Date Noted   Severe persistent asthma without complication [id number]   Gait abnormality 05/28/2022   Adrenal insufficiency ([place]) 03/30/2022   Endocrine disorder, unspecified 07/04/2021   Prediabetes 07/04/2021   Hypothyroidism 07/04/2021   Former smoker 06/29/2021   Seasonal and perennial allergic rhinitis 06/19/2021   Oxygen dependent 06/19/2021   Steroid dependent ([place]) 06/19/2021   Immunosuppressed status ([place]) 06/19/2021   Radiculopathy, lumbar region 12/08/2020   Status post left hip replacement 07/22/2020   Status  post right hip replacement 05/02/2020   Status post total replacement of right hip 04/15/2020   Avascular necrosis of bone of left hip ([place]) 02/10/2020   Avascular necrosis of bone of right hip ([place]) 02/10/2020   Allergic rhinitis 05/21/2019   Asthma-COPD overlap syndrome 04/30/2019   Chronic respiratory failure with hypoxia ([place]) 04/30/2019   Spinal stenosis of cervical region 04/05/2017   Partial tear of right subscapularis tendon    Incisional hernia, without obstruction or gangrene    Urinary frequency 03/21/2012   Migraine equivalent syndrome 03/21/2012   Insomnia 03/21/2012    PCP: Celene Squibb, MD  REFERRING PROVIDER: Marcial Pacas, MD  REFERRING DIAG: R26.9 (ICD-10-CM) - Gait abnormality  THERAPY DIAG:  Difficulty in walking, not elsewhere classified  Rationale for Evaluation and Treatment: Rehabilitation  ONSET DATE: April 12, 2022  SUBJECTIVE:   SUBJECTIVE STATEMENT: Patient reports that she's doing well. Claims that the "waviness" is still there but is getting a little better.  PERTINENT HISTORY: Bladder implant (will have surgery 07/26/2022), hx of B THR, lobectomy PAIN:  Are you having pain? No  PRECAUTIONS: Fall and Other: uses portable O2  WEIGHT BEARING RESTRICTIONS: No  FALLS:  Has patient fallen in last 6 months? Yes. Number of falls 7  LIVING ENVIRONMENT: Lives with: lives with their spouse Lives in: House/apartment Stairs: Yes: External: 1 steps; none Has following equipment at home: Single point cane, Walker - 2 wheeled, Environmental consultant - 4 wheeled, shower chair, bed side commode, and oxygen  OCCUPATION: on disability  PLOF: Independent with household mobility with device and Requires assistive device for independence  PATIENT GOALS: "to be able to walk straight by myself"  NEXT MD VISIT: Aug 30, 2022  OBJECTIVE:   DIAGNOSTIC FINDINGS:  MRI HEAD WITHOUT CONTRAST 06/08/22   TECHNIQUE: Multiplanar, multiecho pulse sequences of the brain and  surrounding structures were obtained without intravenous contrast.   COMPARISON:  Head CT 05/22/2022. Brain MRI 05/22/2011.   FINDINGS: Brain:   No age advanced or lobar predominant parenchymal atrophy.   Multifocal T2 FLAIR hyperintense signal abnormality within the cerebral white matter, overall mild but greater than expected for age.   There is no acute infarct.   No evidence of an intracranial mass.   No chronic intracranial blood products.   No extra-axial fluid collection.   No midline shift.   Vascular: Maintained flow voids within the proximal large arterial vessels.   Skull and upper cervical spine: No focal suspicious calvarial marrow lesion. Cervical spine findings separately reported on same day cervical spine MRI.   Sinuses/Orbits: No mass or acute finding within the imaged orbits. Prior bilateral ocular lens replacement. Trace mucosal thickening within left ethmoid air cells.   Other: Small-volume fluid within the bilateral mastoid air cells.   IMPRESSION: 1.  No evidence of an acute intracranial abnormality. 2. Multifocal T2 FLAIR hyperintense signal abnormality within the cerebral white  matter, overall mild but greater than expected for age. These signal changes have slightly progressed from the prior brain MRI of 05/22/2011. Findings are nonspecific and differential considerations include chronic small vessel ischemic disease, sequelae of chronic migraine headaches, sequelae of a prior infectious/inflammatory process or sequelae of a demyelinating process (such as multiple sclerosis), among others. No lesions specifically suggest demyelinating disease. 3. Small-volume fluid within the bilateral mastoid air cells.  MRI CERVICAL SPINE WITHOUT CONTRAST 06/08/22   TECHNIQUE: Multiplanar, multisequence MR imaging of the cervical spine was performed. No intravenous contrast was administered.   COMPARISON:  Radiographs of the cervical spine 08/06/2017.  Cervical spine MRI 02/24/2017.   FINDINGS: Alignment: Straightening of the expected cervical lordosis. 3 mm C7-T1 grade 1 anterolisthesis.   Vertebrae: Vertebral body height is maintained. Susceptibility artifact arising from ACDF hardware at the C4-C7 levels. No appreciable significant marrow edema or focal suspicious osseous lesion.   Cord: No signal abnormality identified within the cervical spinal cord.   Posterior Fossa, vertebral arteries, paraspinal tissues: Posterior fossa assessed on same-day brain MRI. Flow voids preserved within the imaged cervical vertebral arteries. No paraspinal mass or collection.   Disc levels:   Unless otherwise stated, the level by level findings below have not significantly changed from the prior MRI of 03/06/2017.   Disc degeneration at the non-operative levels, greatest at C3-C4 and C7-T1 (progressed at these levels).   C2-C3: Slight disc bulge. No significant spinal canal or foraminal stenosis.   C3-C4: Disc bulge. New superimposed small central disc protrusion (at site of posterior annular fissure). Uncovertebral hypertrophy on the right, new from the prior MRI. Facet arthrosis. Mild relative spinal canal narrowing (without spinal cord mass effect), new from the prior MRI. Moderate right neural foraminal narrowing, also new from the prior MRI.   C4-C5: Prior ACDF. No significant spinal canal or foraminal stenosis.   C5-C6: Prior ACDF. Uncovertebral hypertrophy on the right. No significant spinal canal stenosis. Mild right neural foraminal narrowing.   C6-C7: Prior ACDF. Uncovertebral vertebrae on the right. No significant spinal canal stenosis. Moderate right neural foraminal narrowing.   C7-T1: 3 mm grade 1 anterolisthesis. Mild disc uncovering. Facet arthrosis. No significant spinal canal stenosis. Mild-to-moderate bilateral neural foraminal narrowing.   IMPRESSION: Prior C4-C7 ACDF. No significant spinal canal stenosis  at these levels. As before, uncovertebral hypertrophy results in neural foraminal narrowing on the right at C5-C6 (mild), and on the right at C6-C7 (moderate).   Cervical spondylosis at the remaining levels, as outlined and with findings most notably as follows.   At C3-C4, there is a disc bulge. New superimposed small central disc protrusion (at site of posterior annular fissure). Uncovertebral hypertrophy on the right, new from the prior MRI. Facet arthrosis. Mild spinal canal narrowing, and moderate right neural foraminal narrowing, new from the prior MRI.   At C7-T1, there is 3 mm grade 1 anterolisthesis. Mild disc uncovering. Facet arthrosis. No significant spinal canal stenosis. Mild-to-moderate bilateral neural foraminal narrowing. Findings at this level have not significantly changed.  COGNITION: Overall cognitive status: Within functional limits for tasks assessed     SENSATION: Not tested  MUSCLE LENGTH: Mild restriction on B hamstrings Moderate restriction on B gastrocsoleus  POSTURE: rounded shoulders and forward head   LOWER EXTREMITY MMT  MMT Right eval Left eval  Hip flexion 4 4  Hip extension 2+ (bridging) 2+  (bridging)  Hip abduction 4 4  Hip adduction    Hip internal rotation    Hip external rotation  Knee flexion 4- 4-  Knee extension 4- 3+  Ankle dorsiflexion 3+ 4-  Ankle plantarflexion 3+ 3+  Ankle inversion    Ankle eversion     (Blank rows = not tested)  LOWER EXTREMITY ROM  Active ROM Right eval Left eval  Hip flexion [facility]  Hip extension    Hip abduction [facility]  Hip adduction [facility]  Hip internal rotation    Hip external rotation    Knee flexion [facility] WFL  Knee extension [facility]  Ankle dorsiflexion [facility] WFL  Ankle plantarflexion [facility] WFL  Ankle inversion    Ankle eversion     (Blank rows = not tested)  FUNCTIONAL TESTS:  5 times sit to stand: 26.97 sec (has to hold on to rollator) 2 minute walk test: 224 ft (with a  rollator) Tinetti POMA : 9 (balance + gait)  GAIT: Distance walked: 224 ft Assistive device utilized:  rollator Level of assistance: Modified independence Comments: decrease foot clearance on B, decreased step length on B  VESTIBULAR ASSESSMENT (06/21/2022):  Head impulse test: positive with corrective saccades, dizziness reported  VOR 1: corrective saccades, dizziness reported  Optokinetic reflex (R/L): impaired, dizziness reported  TODAY'S TREATMENT:                                                                                                                              DATE:  06/27/2022 Seated:  Cervical neck AROM flex/ext, rot x 10  Gastrocnemius strap stretch x 30" x 3  Horizontal saccades, while identifying numbers x 1'  Vertical saccades, while reading words x 1'  Horizontal smooth pursuit, identifying animal silhouettes x 1'  Vertical smooth pursuit, identifying colors/shapes x 1'  VOR 1, 70 bpm, identifying numbers x 1'  VOR cancellation, reading words x 1' Standing on firm surface, normal BOS:  Heel/toe raises with 1 UE support x 10 x 2  Tandem stance x 30" x 2, eyes open, light UE support Static standing, eyes open x 30"  Static standing with head turns x 30"  Rhytmic stabilization x ant/post x 3" x 10  06/21/2022 Seated:  Cervical neck AROM flex/ext, rot x 10  Horizontal saccades, while identifying numbers x 1'  Vertical saccades, while reading words x 1'  Horizontal smooth pursuit, identifying animal silhouettes x 1'  Vertical smooth pursuit, identifying colors/shapes x 1'  VOR 1, 60 bpm, identifying numbers x 1'  VOR cancellation, identifying colors x 1'  06/18/2022 -Education for pt's explanation of visual disturbances during walking; Attempted VOR assessment but limited Head ROM due to previous surgieies.  -Habituation exercises daily at home VOR x 1 3x daily. - 1 x 5  elevated dead lift with weighed balls with 10in box  - 2x 5Weighted sit/stands from  elevated mat with yellow weighted ball.  2  06/15/22 Evaluation and patient education done    PATIENT EDUCATION:  Education details: Updated HEP. Instructed patient to perform standing exercises on  with a stable support (e.g. kitchen counter) and have her husband next to her. Person educated: Patient Education method: Explanation Education comprehension: verbalized understanding  HOME EXERCISE PROGRAM: Access Code: [email or web address] URL: [email or web address] Date: 06/27/2022 Prepared by: Rexene Alberts  Exercises - Seated Calf Stretch with Strap  - 1-2 x daily - 5-7 x weekly - 3 reps - 30 hold - Standing Tandem Balance with Counter Support  - 1-2 x daily - 5-7 x weekly - 30 hold - Heel Toe Raises with Counter Support  - 1-2 x daily - 5-7 x weekly - 2 sets - 10 reps  06/21/2022 GAZE STABILIZATION EXERCISES (2-3x/day, 5-7 days/week, seated) These exercises are designed to improve your eyes' ability to track objects without getting dizzy. When performing these exercises, make sure you are facing a blank wall (like a white wall) to avoid distraction. You can wear your glasses/contact lenses if you must.   Before anything, warm-up your neck muscles by bending it forward and back 10 times, side-to-side 10 times, and slowly turn your head left and right 10 time Exercise 1 (Horizontal Saccades) Use two cards for this exercise While holding the cards on each hand side by side in sitting with arms outstretched, look at the first item on the other card then look at the next item on the second card then alternate your gaze sequentially. Do NOT move your cards nor the your head. Do the following for one minute each Identifying numbers Identifying shapes Exercise 2 (Paradigm x1) Use only one card for this exercise While holding the card in sitting with your arm outstretched, turn your head left and right at your own pace while keeping the card stationary.  Do this for one minute each while  you are: Identifying numbers Identifying shapes Exercise 3 (VOR cancellation) Use only one card for this exercise While holding the card in sitting with your arms outstretched, turn your body, arm, and head at the same time left and right at your own pace Do this for one minute each while you are: Identifying numbers Identifying shapes  3/11/20204 - VOR x 1 Habituation Exercises 3x daily - Sit/stands w/ 4.5lb weight at home 2 x 5 daily.  ASSESSMENT:  CLINICAL IMPRESSION: Interventions today were geared towards gaze stabilization and balance. Tolerated gaze stabilization activities with slight dizziness on the saccades (vertical worse than horizontal) but with mild dizziness on the VOR 1 and cancellation exercises. Demonstrated appropriate levels of fatigue. Rest periods provided. Mild to moderate unsteadiness was noted on standing balance activities. Noted tremors on the LE and sudden body jerks with standing exercises due to impaired proprioception. Provided moderate amount of cueing to ensure correct execution of activity with good carry-over. To date, skilled PT is required to address the impairments and improve function.  OBJECTIVE IMPAIRMENTS: Abnormal gait, decreased balance, decreased coordination, decreased endurance, difficulty walking, decreased strength, impaired flexibility, and pain.   ACTIVITY LIMITATIONS: carrying, lifting, bending, standing, squatting, stairs, transfers, continence, bathing, toileting, dressing, self feeding, hygiene/grooming, and locomotion level  PARTICIPATION LIMITATIONS: meal prep, cleaning, laundry, driving, shopping, community activity, and yard work  PERSONAL FACTORS: Fitness level, Bladder implant (will have surgery 07/26/2022), hx of B THR, lobectomy are also affecting patient's functional outcome.   REHAB POTENTIAL: Fair    CLINICAL DECISION MAKING: Evolving/moderate complexity  EVALUATION COMPLEXITY: Moderate   GOALS: Goals reviewed with  patient? Yes  SHORT TERM GOALS: Target date: 07/13/2022  Pt will demonstrate indep in HEP to facilitate carry-over of skilled services and improve  functional outcomes Goal status: INITIAL  LONG TERM GOALS: Target date: 08/10/2022  Pt will decrease 5TSTS by at least 6 seconds in order to demonstrate clinically significant improvement in LE strength  Baseline: 26.97 sec Goal status: INITIAL  2.  Patient will demonstrate increase in Tinetti Score by 8 points in order to demonstrate clinically significant improvement in balance and decreased risk for falls  Baseline: 9 Goal status: INITIAL  3.  Pt will increase 2MWT by at least 80 ft in order to demonstrate clinically significant improvement in community ambulation Baseline: 224 ft Goal status: INITIAL  4.  Pt will demonstrate increase in LE strength to 4 to facilitate ease and safety in ambulation Baseline: 2+ Goal status: INITIAL  5.  Pt will demonstrate improved LE coordination as manifested by performing 3/4 correct trials in heel-to-shin to facilitate ease in ambulation Baseline: impaired Goal status: INITIAL  6.  Pt will be able to walk with a quad cane for > 100 ft Baseline: walks with a rollator Goal status: INITIAL   PLAN:  PT FREQUENCY: 3x/week  PT DURATION: 8 weeks  PLANNED INTERVENTIONS: Therapeutic exercises, Therapeutic activity, Neuromuscular re-education, Balance training, Gait training, Patient/Family education, Self Care, and Stair training  PLAN FOR NEXT SESSION: Continue POC and may progress as tolerated with emphasis on gaze stabilization, LE strengthening, flexibility, coordination, gait and balance exercises. Provide HEP.   Harvie Heck. Faolan [place], PT, DPT, OCS Board-Certified Clinical Specialist in [place] # ([place]): [phone] T 06/27/2022, 3:31 PM

## 2022-06-28 ENCOUNTER — Ambulatory Visit (HOSPITAL_COMMUNITY): Payer: Medicare Other

## 2022-06-28 DIAGNOSIS — R262 Difficulty in walking, not elsewhere classified: Secondary | ICD-10-CM

## 2022-06-28 NOTE — Therapy (Signed)
OUTPATIENT PHYSICAL THERAPY LOWER EXTREMITY TREATMENT   Patient Name: [name] MRN: [id number] DOB:24-Oct-1965, 57 y.o., female Today's Date: 06/28/2022  END OF SESSION:  PT End of Session - 06/28/22 0823     Visit Number 5    Number of Visits 16    Date for PT Re-Evaluation 08/10/22    Authorization Type BCBS Medicare, Medicaid of [place]    Progress Note Due on Visit 8    PT Start Time 0820    PT Stop Time 0900    PT Time Calculation (min) 40 min    Equipment Utilized During Treatment Gait belt;Oxygen    Activity Tolerance Patient tolerated treatment well    Behavior During Therapy WFL for tasks assessed/performed            Past Medical History:  Diagnosis Date   Anemia    Anxiety    Arthritis    Asthma    Cervical radiculopathy    Cluster headaches    COPD (chronic obstructive pulmonary disease) (HCC)    COPD (chronic obstructive pulmonary disease) with chronic bronchitis    GERD (gastroesophageal reflux disease)    Hyperlipemia    Hypoglycemia    Hypothyroidism    Migraine    Pneumothorax    PONV (postoperative nausea and vomiting)    Pre-diabetes    Rectal discharge 07/28/2010   Shortness of breath    Sleep apnea    cannot tolerate-not using CPAP   Sluggishness 07/28/2010   Past Surgical History:  Procedure Laterality Date   ABDOMINAL HYSTERECTOMY     with bladder suspension   CERVICAL FUSION     CHOLECYSTECTOMY     COLONOSCOPY N/A 07/30/2013   Procedure: COLONOSCOPY;  Surgeon: Rogene Houston, MD;  [facility];  Service: Endoscopy;  Laterality: N/A;  [place] OF SHOULDER Right 10/11/2016   Procedure: EXAM UNDER ANESTHESIA WITH MANIPULATION OF SHOULDER;  Surgeon: Carole Civil, MD;  Location: [facility];  Service: Orthopedics;  Laterality: Right;   HERNIA REPAIR     INCISIONAL HERNIA REPAIR N/A 07/30/2016   Procedure: HERNIA REPAIR INCISIONAL WITH MESH;  Surgeon: Aviva Signs, MD;  Location:  [facility];  Service: General;  Laterality: N/A;   INTERSTIM IMPLANT PLACEMENT  02/02/2021   Procedure: [place] IMPLANT FIRST STAGE LEFT SACRUM ;  Surgeon: Cleon Gustin, MD;  Location: [facility];  Service: Urology;;   Barrie Lyme IMPLANT PLACEMENT N/A 02/23/2021   Procedure: Barrie Lyme IMPLANT SECOND STAGE;  Surgeon: Cleon Gustin, MD;  Location: [facility];  Service: Urology;  Laterality: N/A;  Rep coming, time needs to stay at 2:00   [place]     right lung    POSTERIOR CERVICAL LAMINECTOMY Right 04/05/2017   Procedure: Right C6-7 Posterior cervical laminectomy;  Surgeon: Kary Kos, MD;  Location: [facility];  Service: Neurosurgery;  Laterality: Right;  Right C6-7 Posterior cervical laminectomy   SHOULDER ARTHROSCOPY WITH ROTATOR CUFF REPAIR Right 10/11/2016   Procedure: SHOULDER ARTHROSCOPY;  Surgeon: Carole Civil, MD;  Location: [facility];  Service: Orthopedics;  Laterality: Right;   TOTAL HIP ARTHROPLASTY Right 04/15/2020   Procedure: RIGHT TOTAL HIP ARTHROPLASTY ANTERIOR APPROACH;  Surgeon: Mcarthur Rossetti, MD;  Location: [facility];  Service: Orthopedics;  Laterality: Right;   TOTAL HIP ARTHROPLASTY Left 07/22/2020   Procedure: LEFT  HIP ARTHROPLASTY ANTERIOR APPROACH;  Surgeon: Mcarthur Rossetti, MD;  Location: [facility];  Service: Orthopedics;  Laterality: Left;  RNFA   TUBAL LIGATION     Patient Active Problem List   Diagnosis Date Noted   Severe persistent asthma without complication [id number]   Gait abnormality 05/28/2022   Adrenal insufficiency ([place]) 03/30/2022   Endocrine disorder, unspecified 07/04/2021   Prediabetes 07/04/2021   Hypothyroidism 07/04/2021   Former smoker 06/29/2021   Seasonal and perennial allergic rhinitis 06/19/2021   Oxygen dependent 06/19/2021   Steroid dependent ([place]) 06/19/2021   Immunosuppressed status ([place]) 06/19/2021   Radiculopathy, lumbar region 12/08/2020   Status post left hip replacement 07/22/2020   Status  post right hip replacement 05/02/2020   Status post total replacement of right hip 04/15/2020   Avascular necrosis of bone of left hip ([place]) 02/10/2020   Avascular necrosis of bone of right hip ([place]) 02/10/2020   Allergic rhinitis 05/21/2019   Asthma-COPD overlap syndrome 04/30/2019   Chronic respiratory failure with hypoxia ([place]) 04/30/2019   Spinal stenosis of cervical region 04/05/2017   Partial tear of right subscapularis tendon    Incisional hernia, without obstruction or gangrene    Urinary frequency 03/21/2012   Migraine equivalent syndrome 03/21/2012   Insomnia 03/21/2012    PCP: Celene Squibb, MD  REFERRING PROVIDER: Marcial Pacas, MD  REFERRING DIAG: R26.9 (ICD-10-CM) - Gait abnormality  THERAPY DIAG:  No diagnosis found.  Rationale for Evaluation and Treatment: Rehabilitation  ONSET DATE: April 12, 2022  SUBJECTIVE:   SUBJECTIVE STATEMENT: Patient reports that she's very dizzy after the last session. Patient did not have a good night last night and did not sleep good.  PERTINENT HISTORY: Bladder implant (will have surgery 07/26/2022), hx of B THR, lobectomy PAIN:  Are you having pain? No  PRECAUTIONS: Fall and Other: uses portable O2  WEIGHT BEARING RESTRICTIONS: No  FALLS:  Has patient fallen in last 6 months? Yes. Number of falls 7  LIVING ENVIRONMENT: Lives with: lives with their spouse Lives in: House/apartment Stairs: Yes: External: 1 steps; none Has following equipment at home: Single point cane, Walker - 2 wheeled, Environmental consultant - 4 wheeled, shower chair, bed side commode, and oxygen  OCCUPATION: on disability  PLOF: Independent with household mobility with device and Requires assistive device for independence  PATIENT GOALS: "to be able to walk straight by myself"  NEXT MD VISIT: Aug 30, 2022  OBJECTIVE:   DIAGNOSTIC FINDINGS:  MRI HEAD WITHOUT CONTRAST 06/08/22   TECHNIQUE: Multiplanar, multiecho pulse sequences of the brain and  surrounding structures were obtained without intravenous contrast.   COMPARISON:  Head CT 05/22/2022. Brain MRI 05/22/2011.   FINDINGS: Brain:   No age advanced or lobar predominant parenchymal atrophy.   Multifocal T2 FLAIR hyperintense signal abnormality within the cerebral white matter, overall mild but greater than expected for age.   There is no acute infarct.   No evidence of an intracranial mass.   No chronic intracranial blood products.   No extra-axial fluid collection.   No midline shift.   Vascular: Maintained flow voids within the proximal large arterial vessels.   Skull and upper cervical spine: No focal suspicious calvarial marrow lesion. Cervical spine findings separately reported on same day cervical spine MRI.   Sinuses/Orbits: No mass or acute finding within the imaged orbits. Prior bilateral ocular lens replacement. Trace mucosal thickening within left ethmoid air cells.   Other: Small-volume fluid within the bilateral mastoid air cells.   IMPRESSION: 1.  No evidence of an acute intracranial abnormality. 2. Multifocal T2 FLAIR hyperintense signal abnormality within the  cerebral white matter, overall mild but greater than expected for age. These signal changes have slightly progressed from the prior brain MRI of 05/22/2011. Findings are nonspecific and differential considerations include chronic small vessel ischemic disease, sequelae of chronic migraine headaches, sequelae of a prior infectious/inflammatory process or sequelae of a demyelinating process (such as multiple sclerosis), among others. No lesions specifically suggest demyelinating disease. 3. Small-volume fluid within the bilateral mastoid air cells.  MRI CERVICAL SPINE WITHOUT CONTRAST 06/08/22   TECHNIQUE: Multiplanar, multisequence MR imaging of the cervical spine was performed. No intravenous contrast was administered.   COMPARISON:  Radiographs of the cervical spine 08/06/2017.  Cervical spine MRI 02/24/2017.   FINDINGS: Alignment: Straightening of the expected cervical lordosis. 3 mm C7-T1 grade 1 anterolisthesis.   Vertebrae: Vertebral body height is maintained. Susceptibility artifact arising from ACDF hardware at the C4-C7 levels. No appreciable significant marrow edema or focal suspicious osseous lesion.   Cord: No signal abnormality identified within the cervical spinal cord.   Posterior Fossa, vertebral arteries, paraspinal tissues: Posterior fossa assessed on same-day brain MRI. Flow voids preserved within the imaged cervical vertebral arteries. No paraspinal mass or collection.   Disc levels:   Unless otherwise stated, the level by level findings below have not significantly changed from the prior MRI of 03/06/2017.   Disc degeneration at the non-operative levels, greatest at C3-C4 and C7-T1 (progressed at these levels).   C2-C3: Slight disc bulge. No significant spinal canal or foraminal stenosis.   C3-C4: Disc bulge. New superimposed small central disc protrusion (at site of posterior annular fissure). Uncovertebral hypertrophy on the right, new from the prior MRI. Facet arthrosis. Mild relative spinal canal narrowing (without spinal cord mass effect), new from the prior MRI. Moderate right neural foraminal narrowing, also new from the prior MRI.   C4-C5: Prior ACDF. No significant spinal canal or foraminal stenosis.   C5-C6: Prior ACDF. Uncovertebral hypertrophy on the right. No significant spinal canal stenosis. Mild right neural foraminal narrowing.   C6-C7: Prior ACDF. Uncovertebral vertebrae on the right. No significant spinal canal stenosis. Moderate right neural foraminal narrowing.   C7-T1: 3 mm grade 1 anterolisthesis. Mild disc uncovering. Facet arthrosis. No significant spinal canal stenosis. Mild-to-moderate bilateral neural foraminal narrowing.   IMPRESSION: Prior C4-C7 ACDF. No significant spinal canal stenosis  at these levels. As before, uncovertebral hypertrophy results in neural foraminal narrowing on the right at C5-C6 (mild), and on the right at C6-C7 (moderate).   Cervical spondylosis at the remaining levels, as outlined and with findings most notably as follows.   At C3-C4, there is a disc bulge. New superimposed small central disc protrusion (at site of posterior annular fissure). Uncovertebral hypertrophy on the right, new from the prior MRI. Facet arthrosis. Mild spinal canal narrowing, and moderate right neural foraminal narrowing, new from the prior MRI.   At C7-T1, there is 3 mm grade 1 anterolisthesis. Mild disc uncovering. Facet arthrosis. No significant spinal canal stenosis. Mild-to-moderate bilateral neural foraminal narrowing. Findings at this level have not significantly changed.  COGNITION: Overall cognitive status: Within functional limits for tasks assessed     SENSATION: Not tested  MUSCLE LENGTH: Mild restriction on B hamstrings Moderate restriction on B gastrocsoleus  POSTURE: rounded shoulders and forward head   LOWER EXTREMITY MMT  MMT Right eval Left eval  Hip flexion 4 4  Hip extension 2+ (bridging) 2+  (bridging)  Hip abduction 4 4  Hip adduction    Hip internal rotation    Hip  external rotation    Knee flexion 4- 4-  Knee extension 4- 3+  Ankle dorsiflexion 3+ 4-  Ankle plantarflexion 3+ 3+  Ankle inversion    Ankle eversion     (Blank rows = not tested)  LOWER EXTREMITY ROM  Active ROM Right eval Left eval  Hip flexion [facility]  Hip extension    Hip abduction [facility]  Hip adduction [facility]  Hip internal rotation    Hip external rotation    Knee flexion [facility] WFL  Knee extension [facility]  Ankle dorsiflexion [facility] WFL  Ankle plantarflexion [facility] WFL  Ankle inversion    Ankle eversion     (Blank rows = not tested)  FUNCTIONAL TESTS:  5 times sit to stand: 26.97 sec (has to hold on to rollator) 2 minute walk test: 224 ft (with a  rollator) Tinetti POMA : 9 (balance + gait)  GAIT: Distance walked: 224 ft Assistive device utilized:  rollator Level of assistance: Modified independence Comments: decrease foot clearance on B, decreased step length on B  VESTIBULAR ASSESSMENT (06/21/2022):  Head impulse test: positive with corrective saccades, dizziness reported  VOR 1: corrective saccades, dizziness reported  Optokinetic reflex (R/L): impaired, dizziness reported  TODAY'S TREATMENT:                                                                                                                              DATE:  06/28/2022 Seated:  Cervical neck AROM flex/ext, rot x 10  Gastrocnemius strap stretch x 30" x 3  Horizontal saccades, while identifying numbers x 1'  Vertical saccades, while reading words x 1'  Horizontal smooth pursuit, identifying animal silhouettes x 1'  Vertical smooth pursuit, identifying colors/shapes x 1'  VOR 1, 70 bpm, identifying numbers x 1'  VOR cancellation, reading words x 1' Gastrocnemius slant board stretch x 30" x 3 Standing on firm surface, normal BOS:  Heel/toe raises with 1 UE support x 10 x 2  Tandem stance x 30" x 2, eyes open, light UE support Static standing, eyes open x 30"  Static standing with head turns x 30"  Rhytmic stabilization x ant/post x 3" x 10 x 2  Mini squats 3" x 10 x 2, no UE support  TKE, RTB x 10 x 2 x 3"  Hip ext x 10 x 2  06/27/2022 Seated:  Cervical neck AROM flex/ext, rot x 10  Gastrocnemius strap stretch x 30" x 3  Horizontal saccades, while identifying numbers x 1'  Vertical saccades, while reading words x 1'  Horizontal smooth pursuit, identifying animal silhouettes x 1'  Vertical smooth pursuit, identifying colors/shapes x 1'  VOR 1, 70 bpm, identifying numbers x 1'  VOR cancellation, reading words x 1' Standing on firm surface, normal BOS:  Heel/toe raises with 1 UE support x 10 x 2  Tandem stance x 30" x 3, eyes open, light UE  support Static standing, eyes open x 30"  Static standing with head turns x 30"  Rhytmic stabilization x ant/post x 3" x 10  06/21/2022 Seated:  Cervical neck AROM flex/ext, rot x 10  Horizontal saccades, while identifying numbers x 1'  Vertical saccades, while reading words x 1'  Horizontal smooth pursuit, identifying animal silhouettes x 1'  Vertical smooth pursuit, identifying colors/shapes x 1'  VOR 1, 60 bpm, identifying numbers x 1'  VOR cancellation, identifying colors x 1'  06/18/2022 -Education for pt's explanation of visual disturbances during walking; Attempted VOR assessment but limited Head ROM due to previous surgieies.  -Habituation exercises daily at home VOR x 1 3x daily. - 1 x 5  elevated dead lift with weighed balls with 10in box  - 2x 5Weighted sit/stands from elevated mat with yellow weighted ball.  2  06/15/22 Evaluation and patient education done    PATIENT EDUCATION:  Education details: Updated HEP. Instructed patient to perform standing exercises on with a stable support (e.g. kitchen counter) and have her husband next to her. Person educated: Patient Education method: Explanation Education comprehension: verbalized understanding  HOME EXERCISE PROGRAM: Access Code: [email or web address] URL: [email or web address]  06/28/2022 - Mini Squat with Counter Support  - 1-2 x daily - 5-7 x weekly - 2 sets - 10 reps - 3 hold - Standing Hip Extension with Counter Support  - 1-2 x daily - 5-7 x weekly - 2 sets - 10 reps  Date: 06/27/2022 Prepared by: Rexene Alberts  Exercises - Seated Calf Stretch with Strap  - 1-2 x daily - 5-7 x weekly - 3 reps - 30 hold - Standing Tandem Balance with Counter Support  - 1-2 x daily - 5-7 x weekly - 30 hold - Heel Toe Raises with Counter Support  - 1-2 x daily - 5-7 x weekly - 2 sets - 10 reps  06/21/2022 GAZE STABILIZATION EXERCISES (2-3x/day, 5-7 days/week, seated) These exercises are designed to improve your eyes'  ability to track objects without getting dizzy. When performing these exercises, make sure you are facing a blank wall (like a white wall) to avoid distraction. You can wear your glasses/contact lenses if you must.   Before anything, warm-up your neck muscles by bending it forward and back 10 times, side-to-side 10 times, and slowly turn your head left and right 10 time Exercise 1 (Horizontal Saccades) Use two cards for this exercise While holding the cards on each hand side by side in sitting with arms outstretched, look at the first item on the other card then look at the next item on the second card then alternate your gaze sequentially. Do NOT move your cards nor the your head. Do the following for one minute each Identifying numbers Identifying shapes Exercise 2 (Paradigm x1) Use only one card for this exercise While holding the card in sitting with your arm outstretched, turn your head left and right at your own pace while keeping the card stationary.  Do this for one minute each while you are: Identifying numbers Identifying shapes Exercise 3 (VOR cancellation) Use only one card for this exercise While holding the card in sitting with your arms outstretched, turn your body, arm, and head at the same time left and right at your own pace Do this for one minute each while you are: Identifying numbers Identifying shapes  3/11/20204 - VOR x 1 Habituation Exercises 3x daily - Sit/stands w/ 4.5lb weight at home 2 x 5 daily.  ASSESSMENT:  CLINICAL IMPRESSION: Interventions today were geared towards gaze stabilization and balance.  Still tolerated gaze stabilization activities with slight dizziness on the saccades (vertical worse than horizontal) but with mild dizziness on the VOR 1 and cancellation exercises. Demonstrated appropriate levels of fatigue. Rest periods provided. Still has mild to moderate unsteadiness on standing balance activities. Tremors on the LE and sudden body jerks are  still present with standing exercises due to impaired proprioception. Provided moderate amount of cueing to ensure correct execution of activity with good carry-over. To date, skilled PT is required to address the impairments and improve function.  OBJECTIVE IMPAIRMENTS: Abnormal gait, decreased balance, decreased coordination, decreased endurance, difficulty walking, decreased strength, impaired flexibility, and pain.   ACTIVITY LIMITATIONS: carrying, lifting, bending, standing, squatting, stairs, transfers, continence, bathing, toileting, dressing, self feeding, hygiene/grooming, and locomotion level  PARTICIPATION LIMITATIONS: meal prep, cleaning, laundry, driving, shopping, community activity, and yard work  PERSONAL FACTORS: Fitness level, Bladder implant (will have surgery 07/26/2022), hx of B THR, lobectomy are also affecting patient's functional outcome.   REHAB POTENTIAL: Fair    CLINICAL DECISION MAKING: Evolving/moderate complexity  EVALUATION COMPLEXITY: Moderate   GOALS: Goals reviewed with patient? Yes  SHORT TERM GOALS: Target date: 07/13/2022  Pt will demonstrate indep in HEP to facilitate carry-over of skilled services and improve functional outcomes Goal status: INITIAL  LONG TERM GOALS: Target date: 08/10/2022  Pt will decrease 5TSTS by at least 6 seconds in order to demonstrate clinically significant improvement in LE strength  Baseline: 26.97 sec Goal status: INITIAL  2.  Patient will demonstrate increase in Tinetti Score by 8 points in order to demonstrate clinically significant improvement in balance and decreased risk for falls  Baseline: 9 Goal status: INITIAL  3.  Pt will increase 2MWT by at least 80 ft in order to demonstrate clinically significant improvement in community ambulation Baseline: 224 ft Goal status: INITIAL  4.  Pt will demonstrate increase in LE strength to 4 to facilitate ease and safety in ambulation Baseline: 2+ Goal status:  INITIAL  5.  Pt will demonstrate improved LE coordination as manifested by performing 3/4 correct trials in heel-to-shin to facilitate ease in ambulation Baseline: impaired Goal status: INITIAL  6.  Pt will be able to walk with a quad cane for > 100 ft Baseline: walks with a rollator Goal status: INITIAL   PLAN:  PT FREQUENCY: 3x/week  PT DURATION: 8 weeks  PLANNED INTERVENTIONS: Therapeutic exercises, Therapeutic activity, Neuromuscular re-education, Balance training, Gait training, Patient/Family education, Self Care, and Stair training  PLAN FOR NEXT SESSION: Continue POC and may progress as tolerated with emphasis on gaze stabilization, LE strengthening, flexibility, coordination, gait and balance exercises. Provide HEP.   Harvie Heck. Kemiyah Tarazon, PT, DPT, OCS Board-Certified Clinical Specialist in [place] # ([place]): [phone] T 06/28/2022, 8:23 AM

## 2022-06-30 DIAGNOSIS — N3281 Overactive bladder: Secondary | ICD-10-CM | POA: Insufficient documentation

## 2022-07-02 ENCOUNTER — Ambulatory Visit (HOSPITAL_COMMUNITY): Payer: Medicare Other

## 2022-07-02 DIAGNOSIS — R262 Difficulty in walking, not elsewhere classified: Secondary | ICD-10-CM | POA: Diagnosis not present

## 2022-07-02 DIAGNOSIS — M25551 Pain in right hip: Secondary | ICD-10-CM

## 2022-07-02 NOTE — Therapy (Signed)
OUTPATIENT PHYSICAL THERAPY LOWER EXTREMITY TREATMENT   Patient Name: [name] MRN: [id number] DOB:19-Jul-1965, 57 y.o., female Today's Date: 07/02/2022  END OF SESSION:  PT End of Session - 07/02/22 1108     Visit Number 6    Number of Visits 16    Date for PT Re-Evaluation 08/10/22    Authorization Type BCBS Medicare, Medicaid of [place]    Progress Note Due on Visit 8    PT Start Time 1109    PT Stop Time 1150    PT Time Calculation (min) 41 min    Equipment Utilized During Treatment Gait belt;Oxygen    Activity Tolerance Patient tolerated treatment well    Behavior During Therapy WFL for tasks assessed/performed            Past Medical History:  Diagnosis Date   Anemia    Anxiety    Arthritis    Asthma    Cervical radiculopathy    Cluster headaches    COPD (chronic obstructive pulmonary disease) (HCC)    COPD (chronic obstructive pulmonary disease) with chronic bronchitis    GERD (gastroesophageal reflux disease)    Hyperlipemia    Hypoglycemia    Hypothyroidism    Migraine    Pneumothorax    PONV (postoperative nausea and vomiting)    Pre-diabetes    Rectal discharge 07/28/2010   Shortness of breath    Sleep apnea    cannot tolerate-not using CPAP   Sluggishness 07/28/2010   Past Surgical History:  Procedure Laterality Date   ABDOMINAL HYSTERECTOMY     with bladder suspension   CERVICAL FUSION     CHOLECYSTECTOMY     COLONOSCOPY N/A 07/30/2013   Procedure: COLONOSCOPY;  Surgeon: Rogene Houston, MD;  [facility];  Service: Endoscopy;  Laterality: N/A;  [place] OF SHOULDER Right 10/11/2016   Procedure: EXAM UNDER ANESTHESIA WITH MANIPULATION OF SHOULDER;  Surgeon: Carole Civil, MD;  Location: [facility];  Service: Orthopedics;  Laterality: Right;   HERNIA REPAIR     INCISIONAL HERNIA REPAIR N/A 07/30/2016   Procedure: HERNIA REPAIR INCISIONAL WITH MESH;  Surgeon: Aviva Signs, MD;  Location:  [facility];  Service: General;  Laterality: N/A;   INTERSTIM IMPLANT PLACEMENT  02/02/2021   Procedure: [place] IMPLANT FIRST STAGE LEFT SACRUM ;  Surgeon: Cleon Gustin, MD;  Location: [facility];  Service: Urology;;   [place]rie [place]yme IMPLANT PLACEMENT N/A 02/23/2021   Procedure: [place] IMPLANT SECOND STAGE;  Surgeon: Cleon Gustin, MD;  Location: [facility];  Service: Urology;  Laterality: N/A;  Rep coming, time needs to stay at 2:00   [place]     right lung    POSTERIOR CERVICAL LAMINECTOMY Right 04/05/2017   Procedure: Right C6-7 Posterior cervical laminectomy;  Surgeon: Kary Kos, MD;  Location: [place];  Service: Neurosurgery;  Laterality: Right;  Right C6-7 Posterior cervical laminectomy   SHOULDER ARTHROSCOPY WITH ROTATOR CUFF REPAIR Right 10/11/2016   Procedure: SHOULDER ARTHROSCOPY;  Surgeon: Carole Civil, MD;  Location: [facility];  Service: Orthopedics;  Laterality: Right;   TOTAL HIP ARTHROPLASTY Right 04/15/2020   Procedure: RIGHT TOTAL HIP ARTHROPLASTY ANTERIOR APPROACH;  Surgeon: Mcarthur Rossetti, MD;  Location: [facility];  Service: Orthopedics;  Laterality: Right;   TOTAL HIP ARTHROPLASTY Left 07/22/2020   Procedure: LEFT  HIP ARTHROPLASTY ANTERIOR APPROACH;  Surgeon: Mcarthur Rossetti, MD;  Location: [facility];  Service: Orthopedics;  Laterality: Left;  RNFA   TUBAL LIGATION     Patient Active Problem List   Diagnosis Date Noted   Severe persistent asthma without complication [id number]   Gait abnormality 05/28/2022   Adrenal insufficiency ([place]) 03/30/2022   Endocrine disorder, unspecified 07/04/2021   Prediabetes 07/04/2021   Hypothyroidism 07/04/2021   Former smoker 06/29/2021   Seasonal and perennial allergic rhinitis 06/19/2021   Oxygen dependent 06/19/2021   Steroid dependent ([place]) 06/19/2021   Immunosuppressed status ([place]) 06/19/2021   Radiculopathy, lumbar region 12/08/2020   Status post left hip replacement 07/22/2020   Status  post right hip replacement 05/02/2020   Status post total replacement of right hip 04/15/2020   Avascular necrosis of bone of left hip ([place]) 02/10/2020   Avascular necrosis of bone of right hip ([place]) 02/10/2020   Allergic rhinitis 05/21/2019   Asthma-COPD overlap syndrome 04/30/2019   Chronic respiratory failure with hypoxia ([place]) 04/30/2019   Spinal stenosis of cervical region 04/05/2017   Partial tear of right subscapularis tendon    Incisional hernia, without obstruction or gangrene    Urinary frequency 03/21/2012   Migraine equivalent syndrome 03/21/2012   Insomnia 03/21/2012    PCP: Celene Squibb, MD  REFERRING PROVIDER: Marcial Pacas, MD  REFERRING DIAG: R26.9 (ICD-10-CM) - Gait abnormality  THERAPY DIAG:  Difficulty in walking, not elsewhere classified  Pain in right hip  Rationale for Evaluation and Treatment: Rehabilitation  ONSET DATE: April 12, 2022  SUBJECTIVE:   SUBJECTIVE STATEMENT: Patient had a fall last night; she got up to go to the bathroom; she fell coming out of the bathroom onto her right side; she was able to pull her self up with the use of the sink of walker.  Right side is sore today from her fall.  "I have waves in my head; feels like I'm on a boat rocking"; arrived with RW and using portable O2 set at 3L.  Waiting to here from [facility] neurology.  Reports compliant with HEP but cannot tell much difference in her dizziness episode.     PERTINENT HISTORY: Bladder implant (will have surgery 07/26/2022), hx of B THR, lobectomy PAIN:  Are you having pain? No  PRECAUTIONS: Fall and Other: uses portable O2  WEIGHT BEARING RESTRICTIONS: No  FALLS:  Has patient fallen in last 6 months? Yes. Number of falls 7  LIVING ENVIRONMENT: Lives with: lives with their spouse Lives in: House/apartment Stairs: Yes: External: 1 steps; none Has following equipment at home: Single point cane, Walker - 2 wheeled, Environmental consultant - 4 wheeled, shower chair, bed side commode, and  oxygen  OCCUPATION: on disability  PLOF: Independent with household mobility with device and Requires assistive device for independence  PATIENT GOALS: "to be able to walk straight by myself"  NEXT MD VISIT: Aug 30, 2022  OBJECTIVE:   DIAGNOSTIC FINDINGS:  MRI HEAD WITHOUT CONTRAST 06/08/22   TECHNIQUE: Multiplanar, multiecho pulse sequences of the brain and surrounding structures were obtained without intravenous contrast.   COMPARISON:  Head CT 05/22/2022. Brain MRI 05/22/2011.   FINDINGS: Brain:   No age advanced or lobar predominant parenchymal atrophy.   Multifocal T2 FLAIR hyperintense signal abnormality within the cerebral white matter, overall mild but greater than expected for age.   There is no acute infarct.   No evidence of an intracranial mass.   No chronic intracranial blood products.   No extra-axial fluid collection.   No midline shift.   Vascular: Maintained flow voids within the proximal large arterial  vessels.   Skull and upper cervical spine: No focal suspicious calvarial marrow lesion. Cervical spine findings separately reported on same day cervical spine MRI.   Sinuses/Orbits: No mass or acute finding within the imaged orbits. Prior bilateral ocular lens replacement. Trace mucosal thickening within left ethmoid air cells.   Other: Small-volume fluid within the bilateral mastoid air cells.   IMPRESSION: 1.  No evidence of an acute intracranial abnormality. 2. Multifocal T2 FLAIR hyperintense signal abnormality within the cerebral white matter, overall mild but greater than expected for age. These signal changes have slightly progressed from the prior brain MRI of 05/22/2011. Findings are nonspecific and differential considerations include chronic small vessel ischemic disease, sequelae of chronic migraine headaches, sequelae of a prior infectious/inflammatory process or sequelae of a demyelinating process (such as multiple sclerosis),  among others. No lesions specifically suggest demyelinating disease. 3. Small-volume fluid within the bilateral mastoid air cells.  MRI CERVICAL SPINE WITHOUT CONTRAST 06/08/22   TECHNIQUE: Multiplanar, multisequence MR imaging of the cervical spine was performed. No intravenous contrast was administered.   COMPARISON:  Radiographs of the cervical spine 08/06/2017. Cervical spine MRI 02/24/2017.   FINDINGS: Alignment: Straightening of the expected cervical lordosis. 3 mm C7-T1 grade 1 anterolisthesis.   Vertebrae: Vertebral body height is maintained. Susceptibility artifact arising from ACDF hardware at the C4-C7 levels. No appreciable significant marrow edema or focal suspicious osseous lesion.   Cord: No signal abnormality identified within the cervical spinal cord.   Posterior Fossa, vertebral arteries, paraspinal tissues: Posterior fossa assessed on same-day brain MRI. Flow voids preserved within the imaged cervical vertebral arteries. No paraspinal mass or collection.   Disc levels:   Unless otherwise stated, the level by level findings below have not significantly changed from the prior MRI of 03/06/2017.   Disc degeneration at the non-operative levels, greatest at C3-C4 and C7-T1 (progressed at these levels).   C2-C3: Slight disc bulge. No significant spinal canal or foraminal stenosis.   C3-C4: Disc bulge. New superimposed small central disc protrusion (at site of posterior annular fissure). Uncovertebral hypertrophy on the right, new from the prior MRI. Facet arthrosis. Mild relative spinal canal narrowing (without spinal cord mass effect), new from the prior MRI. Moderate right neural foraminal narrowing, also new from the prior MRI.   C4-C5: Prior ACDF. No significant spinal canal or foraminal stenosis.   C5-C6: Prior ACDF. Uncovertebral hypertrophy on the right. No significant spinal canal stenosis. Mild right neural foraminal narrowing.   C6-C7: Prior  ACDF. Uncovertebral vertebrae on the right. No significant spinal canal stenosis. Moderate right neural foraminal narrowing.   C7-T1: 3 mm grade 1 anterolisthesis. Mild disc uncovering. Facet arthrosis. No significant spinal canal stenosis. Mild-to-moderate bilateral neural foraminal narrowing.   IMPRESSION: Prior C4-C7 ACDF. No significant spinal canal stenosis at these levels. As before, uncovertebral hypertrophy results in neural foraminal narrowing on the right at C5-C6 (mild), and on the right at C6-C7 (moderate).   Cervical spondylosis at the remaining levels, as outlined and with findings most notably as follows.   At C3-C4, there is a disc bulge. New superimposed small central disc protrusion (at site of posterior annular fissure). Uncovertebral hypertrophy on the right, new from the prior MRI. Facet arthrosis. Mild spinal canal narrowing, and moderate right neural foraminal narrowing, new from the prior MRI.   At C7-T1, there is 3 mm grade 1 anterolisthesis. Mild disc uncovering. Facet arthrosis. No significant spinal canal stenosis. Mild-to-moderate bilateral neural foraminal narrowing. Findings at this level have not  significantly changed.  COGNITION: Overall cognitive status: Within functional limits for tasks assessed     SENSATION: Not tested  MUSCLE LENGTH: Mild restriction on B hamstrings Moderate restriction on B gastrocsoleus  POSTURE: rounded shoulders and forward head   LOWER EXTREMITY MMT  MMT Right eval Left eval  Hip flexion 4 4  Hip extension 2+ (bridging) 2+  (bridging)  Hip abduction 4 4  Hip adduction    Hip internal rotation    Hip external rotation    Knee flexion 4- 4-  Knee extension 4- 3+  Ankle dorsiflexion 3+ 4-  Ankle plantarflexion 3+ 3+  Ankle inversion    Ankle eversion     (Blank rows = not tested)  LOWER EXTREMITY ROM  Active ROM Right eval Left eval  Hip flexion [facility]  Hip extension    Hip abduction [facility]   Hip adduction [facility]  Hip internal rotation    Hip external rotation    Knee flexion [facility] WFL  Knee extension [facility]  Ankle dorsiflexion [facility] WFL  Ankle plantarflexion [facility] WFL  Ankle inversion    Ankle eversion     (Blank rows = not tested)  FUNCTIONAL TESTS:  5 times sit to stand: 26.97 sec (has to hold on to rollator) 2 minute walk test: 224 ft (with a rollator) Tinetti POMA : 9 (balance + gait)  GAIT: Distance walked: 224 ft Assistive device utilized:  rollator Level of assistance: Modified independence Comments: decrease foot clearance on B, decreased step length on B  VESTIBULAR ASSESSMENT (06/21/2022):  Head impulse test: positive with corrective saccades, dizziness reported  VOR 1: corrective saccades, dizziness reported  Optokinetic reflex (R/L): impaired, dizziness reported  TODAY'S TREATMENT:                                                                                                                              DATE:  07/02/22 Sitting: AROM of the cervical spine flexion/extension, rotation x 10 Gastroc stretch with strap 5 x 20" each  Standing: Heel/toe raises 2 x 10 Hip abduction and extension 2 x 10 each Tandem stance 2 x 30" fingertip support Small base of support standing 3 x 30" no UE support Shoulder width stance with light support with head turns and nods x 10 each    06/28/2022 Seated:  Cervical neck AROM flex/ext, rot x 10  Gastrocnemius strap stretch x 30" x 3  Horizontal saccades, while identifying numbers x 1'  Vertical saccades, while reading words x 1'  Horizontal smooth pursuit, identifying animal silhouettes x 1'  Vertical smooth pursuit, identifying colors/shapes x 1'  VOR 1, 70 bpm, identifying numbers x 1'  VOR cancellation, reading words x 1' Gastrocnemius slant board stretch x 30" x 3 Standing on firm surface, normal BOS:  Heel/toe raises with 1 UE support x 10 x 2  Tandem stance x 30" x 2, eyes open, light UE support Static  standing, eyes open  x 30"  Static standing with head turns x 30"  Rhytmic stabilization x ant/post x 3" x 10 x 2  Mini squats 3" x 10 x 2, no UE support  TKE, RTB x 10 x 2 x 3"  Hip ext x 10 x 2  06/27/2022 Seated:  Cervical neck AROM flex/ext, rot x 10  Gastrocnemius strap stretch x 30" x 3  Horizontal saccades, while identifying numbers x 1'  Vertical saccades, while reading words x 1'  Horizontal smooth pursuit, identifying animal silhouettes x 1'  Vertical smooth pursuit, identifying colors/shapes x 1'  VOR 1, 70 bpm, identifying numbers x 1'  VOR cancellation, reading words x 1' Standing on firm surface, normal BOS:  Heel/toe raises with 1 UE support x 10 x 2  Tandem stance x 30" x 3, eyes open, light UE support Static standing, eyes open x 30"  Static standing with head turns x 30"  Rhytmic stabilization x ant/post x 3" x 10  06/21/2022 Seated:  Cervical neck AROM flex/ext, rot x 10  Horizontal saccades, while identifying numbers x 1'  Vertical saccades, while reading words x 1'  Horizontal smooth pursuit, identifying animal silhouettes x 1'  Vertical smooth pursuit, identifying colors/shapes x 1'  VOR 1, 60 bpm, identifying numbers x 1'  VOR cancellation, identifying colors x 1'  06/18/2022 -Education for pt's explanation of visual disturbances during walking; Attempted VOR assessment but limited Head ROM due to previous surgieies.  -Habituation exercises daily at home VOR x 1 3x daily. - 1 x 5  elevated dead lift with weighed balls with 10in box  - 2x 5Weighted sit/stands from elevated mat with yellow weighted ball.  2  06/15/22 Evaluation and patient education done    PATIENT EDUCATION:  Education details: Updated HEP. Instructed patient to perform standing exercises on with a stable support (e.g. kitchen counter) and have her husband next to her. Person educated: Patient Education method: Explanation Education comprehension: verbalized understanding  HOME  EXERCISE PROGRAM: 07/02/22 hip abduction, small bos standing and regular standing head turns and nods  Access Code: [email or web address] URL: [email or web address]  06/28/2022 - Mini Squat with Counter Support  - 1-2 x daily - 5-7 x weekly - 2 sets - 10 reps - 3 hold - Standing Hip Extension with Counter Support  - 1-2 x daily - 5-7 x weekly - 2 sets - 10 reps  Date: 06/27/2022 Prepared by: Rexene Alberts  Exercises - Seated Calf Stretch with Strap  - 1-2 x daily - 5-7 x weekly - 3 reps - 30 hold - Standing Tandem Balance with Counter Support  - 1-2 x daily - 5-7 x weekly - 30 hold - Heel Toe Raises with Counter Support  - 1-2 x daily - 5-7 x weekly - 2 sets - 10 reps  06/21/2022 GAZE STABILIZATION EXERCISES (2-3x/day, 5-7 days/week, seated) These exercises are designed to improve your eyes' ability to track objects without getting dizzy. When performing these exercises, make sure you are facing a blank wall (like a white wall) to avoid distraction. You can wear your glasses/contact lenses if you must.   Before anything, warm-up your neck muscles by bending it forward and back 10 times, side-to-side 10 times, and slowly turn your head left and right 10 time Exercise 1 (Horizontal Saccades) Use two cards for this exercise While holding the cards on each hand side by side in sitting with arms outstretched, look at the first item on the other card then look at the next  item on the second card then alternate your gaze sequentially. Do NOT move your cards nor the your head. Do the following for one minute each Identifying numbers Identifying shapes Exercise 2 (Par[employer] x1) Use only one card for this exercise While holding the card in sitting with your arm outstretched, turn your head left and right at your own pace while keeping the card stationary.  Do this for one minute each while you are: Identifying numbers Identifying shapes Exercise 3 (VOR cancellation) Use only one card for  this exercise While holding the card in sitting with your arms outstretched, turn your body, arm, and head at the same time left and right at your own pace Do this for one minute each while you are: Identifying numbers Identifying shapes  3/11/20204 - VOR x 1 Habituation Exercises 3x daily - Sit/stands w/ 4.5lb weight at home 2 x 5 daily.  ASSESSMENT:  CLINICAL IMPRESSION: Today's session continued to address lower extremity strengthening and balance; max difficulty with standing with small BOS or tandem stance.  Patient needs CGA for safety with all exercises; noted "jerks" as she tries to find her balance.  Reports right hip soreness due to recent fall but otherwise no new compliant.  Patient remains a high fall risk.   To date, skilled PT is required to address the impairments and improve function.  OBJECTIVE IMPAIRMENTS: Abnormal gait, decreased balance, decreased coordination, decreased endurance, difficulty walking, decreased strength, impaired flexibility, and pain.   ACTIVITY LIMITATIONS: carrying, lifting, bending, standing, squatting, stairs, transfers, continence, bathing, toileting, dressing, self feeding, hygiene/grooming, and locomotion level  PARTICIPATION LIMITATIONS: meal prep, cleaning, laundry, driving, shopping, community activity, and yard work  PERSONAL FACTORS: Fitness level, Bladder implant (will have surgery 07/26/2022), hx of B THR, lobectomy are also affecting patient's functional outcome.   REHAB POTENTIAL: Fair    CLINICAL DECISION MAKING: Evolving/moderate complexity  EVALUATION COMPLEXITY: Moderate   GOALS: Goals reviewed with patient? Yes  SHORT TERM GOALS: Target date: 07/13/2022  Pt will demonstrate indep in HEP to facilitate carry-over of skilled services and improve functional outcomes Goal status: INITIAL  LONG TERM GOALS: Target date: 08/10/2022  Pt will decrease 5TSTS by at least 6 seconds in order to demonstrate clinically significant  improvement in LE strength  Baseline: 26.97 sec Goal status: INITIAL  2.  Patient will demonstrate increase in Tinetti Score by 8 points in order to demonstrate clinically significant improvement in balance and decreased risk for falls  Baseline: 9 Goal status: INITIAL  3.  Pt will increase 2MWT by at least 80 ft in order to demonstrate clinically significant improvement in community ambulation Baseline: 224 ft Goal status: INITIAL  4.  Pt will demonstrate increase in LE strength to 4 to facilitate ease and safety in ambulation Baseline: 2+ Goal status: INITIAL  5.  Pt will demonstrate improved LE coordination as manifested by performing 3/4 correct trials in heel-to-shin to facilitate ease in ambulation Baseline: impaired Goal status: INITIAL  6.  Pt will be able to walk with a quad cane for > 100 ft Baseline: walks with a rollator Goal status: INITIAL   PLAN:  PT FREQUENCY: 3x/week  PT DURATION: 8 weeks  PLANNED INTERVENTIONS: Therapeutic exercises, Therapeutic activity, Neuromuscular re-education, Balance training, Gait training, Patient/Family education, Self Care, and Stair training  PLAN FOR NEXT SESSION: Continue POC and may progress as tolerated with emphasis on gaze stabilization, LE strengthening, flexibility, coordination, gait and balance exercises. Provide HEP.   12:02 PM, 07/02/22 Jacquelyn Antony Small Donnal Debar  MPT [facility] [place] [phone]

## 2022-07-03 ENCOUNTER — Encounter: Payer: Self-pay | Admitting: Neurology

## 2022-07-04 ENCOUNTER — Other Ambulatory Visit (HOSPITAL_COMMUNITY): Payer: Self-pay

## 2022-07-05 ENCOUNTER — Other Ambulatory Visit (HOSPITAL_COMMUNITY): Payer: Self-pay

## 2022-07-05 ENCOUNTER — Ambulatory Visit (HOSPITAL_COMMUNITY): Payer: Medicare Other

## 2022-07-05 DIAGNOSIS — R262 Difficulty in walking, not elsewhere classified: Secondary | ICD-10-CM

## 2022-07-05 DIAGNOSIS — M25551 Pain in right hip: Secondary | ICD-10-CM

## 2022-07-05 NOTE — Therapy (Signed)
OUTPATIENT PHYSICAL THERAPY LOWER EXTREMITY TREATMENT   Patient Name: [name] MRN: [id number] DOB:[birth year], 57 y.o., female Today's Date: 07/05/2022  END OF SESSION:  PT End of Session - 07/05/22 0822     Visit Number 7    Number of Visits 16    Date for PT Re-Evaluation 08/10/22    Authorization Type BCBS Medicare, Medicaid of [place]    Progress Note Due on Visit 8    PT Start Time 0815    PT Stop Time 0855    PT Time Calculation (min) 40 min    Equipment Utilized During Treatment Gait belt;Oxygen    Activity Tolerance Patient tolerated treatment well    Behavior During Therapy WFL for tasks assessed/performed            Past Medical History:  Diagnosis Date   Anemia    Anxiety    Arthritis    Asthma    Cervical radiculopathy    Cluster headaches    COPD (chronic obstructive pulmonary disease) (HCC)    COPD (chronic obstructive pulmonary disease) with chronic bronchitis    GERD (gastroesophageal reflux disease)    Hyperlipemia    Hypoglycemia    Hypothyroidism    Migraine    Pneumothorax    PONV (postoperative nausea and vomiting)    Pre-diabetes    Rectal discharge 07/28/2010   Shortness of breath    Sleep apnea    cannot tolerate-not using CPAP   Sluggishness 07/28/2010   Past Surgical History:  Procedure Laterality Date   ABDOMINAL HYSTERECTOMY     with bladder suspension   CERVICAL FUSION     CHOLECYSTECTOMY     COLONOSCOPY N/A 07/30/2013   Procedure: COLONOSCOPY;  Surgeon: Rogene Houston, MD;  [facility];  Service: Endoscopy;  Laterality: N/A;  [place] OF SHOULDER Right 10/11/2016   Procedure: EXAM UNDER ANESTHESIA WITH MANIPULATION OF SHOULDER;  Surgeon: Carole Civil, MD;  Location: [facility];  Service: Orthopedics;  Laterality: Right;   HERNIA REPAIR     INCISIONAL HERNIA REPAIR N/A 07/30/2016   Procedure: HERNIA REPAIR INCISIONAL WITH MESH;  Surgeon: Aviva Signs, MD;  Location:  [facility];  Service: General;  Laterality: N/A;   INTERSTIM IMPLANT PLACEMENT  02/02/2021   Procedure: [place] IMPLANT FIRST STAGE LEFT SACRUM ;  Surgeon: Cleon Gustin, MD;  Location: [facility];  Service: Urology;;   Barrie Lyme IMPLANT PLACEMENT N/A 02/23/2021   Procedure: Barrie Lyme IMPLANT SECOND STAGE;  Surgeon: Cleon Gustin, MD;  Location: [facility];  Service: Urology;  Laterality: N/A;  Rep coming, time needs to stay at 2:00   [place]     right lung    POSTERIOR CERVICAL LAMINECTOMY Right 04/05/2017   Procedure: Right C6-7 Posterior cervical laminectomy;  Surgeon: Kary Kos, MD;  Location: [facility];  Service: Neurosurgery;  Laterality: Right;  Right C6-7 Posterior cervical laminectomy   SHOULDER ARTHROSCOPY WITH ROTATOR CUFF REPAIR Right 10/11/2016   Procedure: SHOULDER ARTHROSCOPY;  Surgeon: Carole Civil, MD;  Location: [facility];  Service: Orthopedics;  Laterality: Right;   TOTAL HIP ARTHROPLASTY Right 04/15/2020   Procedure: RIGHT TOTAL HIP ARTHROPLASTY ANTERIOR APPROACH;  Surgeon: Mcarthur Rossetti, MD;  Location: [facility];  Service: Orthopedics;  Laterality: Right;   TOTAL HIP ARTHROPLASTY Left 07/22/2020   Procedure: LEFT  HIP ARTHROPLASTY ANTERIOR APPROACH;  Surgeon: Mcarthur Rossetti, MD;  Location[facility];  Service: Orthopedics;  Laterality: Left;  RNFA   TUBAL LIGATION     Patient Active Problem List   Diagnosis Date Noted   Severe persistent asthma without complication [id number]   Gait abnormality 05/28/2022   Adrenal insufficiency ([place]) 03/30/2022   Endocrine disorder, unspecified 07/04/2021   Prediabetes 07/04/2021   Hypothyroidism 07/04/2021   Former smoker 06/29/2021   Seasonal and perennial allergic rhinitis 06/19/2021   Oxygen dependent 06/19/2021   Steroid dependent ([place]) 06/19/2021   Immunosuppressed status ([place]) 06/19/2021   Radiculopathy, lumbar region 12/08/2020   Status post left hip replacement 07/22/2020   Status  post right hip replacement 05/02/2020   Status post total replacement of right hip 04/15/2020   Avascular necrosis of bone of left hip ([place]) 02/10/2020   Avascular necrosis of bone of right hip ([place]) 02/10/2020   Allergic rhinitis 05/21/2019   Asthma-COPD overlap syndrome 04/30/2019   Chronic respiratory failure with hypoxia ([place]) 04/30/2019   Spinal stenosis of cervical region 04/05/2017   Partial tear of right subscapularis tendon    Incisional hernia, without obstruction or gangrene    Urinary frequency 03/21/2012   Migraine equivalent syndrome 03/21/2012   Insomnia 03/21/2012    PCP: Celene Squibb, MD  REFERRING PROVIDER: Marcial Pacas, MD  REFERRING DIAG: R26.9 (ICD-10-CM) - Gait abnormality  THERAPY DIAG:  Difficulty in walking, not elsewhere classified  Pain in right hip  Rationale for Evaluation and Treatment: Rehabilitation  ONSET DATE: April 12, 2022  SUBJECTIVE:   SUBJECTIVE STATEMENT: Patient states that she has some bruising from the fall that she had this week and she's a little sore. Patient is not SOB. Patient still c/o some "wavy" sensation but it is not as bad as the first time that she had therapy.   PERTINENT HISTORY: Bladder implant (will have surgery 07/26/2022), hx of B THR, lobectomy PAIN:  Are you having pain? No  PRECAUTIONS: Fall and Other: uses portable O2  WEIGHT BEARING RESTRICTIONS: No  FALLS:  Has patient fallen in last 6 months? Yes. Number of falls 7  LIVING ENVIRONMENT: Lives with: lives with their spouse Lives in: House/apartment Stairs: Yes: External: 1 steps; none Has following equipment at home: Single point cane, Walker - 2 wheeled, Environmental consultant - 4 wheeled, shower chair, bed side commode, and oxygen  OCCUPATION: on disability  PLOF: Independent with household mobility with device and Requires assistive device for independence  PATIENT GOALS: "to be able to walk straight by myself"  NEXT MD VISIT: Aug 30, 2022  OBJECTIVE:    DIAGNOSTIC FINDINGS:  MRI HEAD WITHOUT CONTRAST 06/08/22   TECHNIQUE: Multiplanar, multiecho pulse sequences of the brain and surrounding structures were obtained without intravenous contrast.   COMPARISON:  Head CT 05/22/2022. Brain MRI 05/22/2011.   FINDINGS: Brain:   No age advanced or lobar predominant parenchymal atrophy.   Multifocal T2 FLAIR hyperintense signal abnormality within the cerebral white matter, overall mild but greater than expected for age.   There is no acute infarct.   No evidence of an intracranial mass.   No chronic intracranial blood products.   No extra-axial fluid collection.   No midline shift.   Vascular: Maintained flow voids within the proximal large arterial vessels.   Skull and upper cervical spine: No focal suspicious calvarial marrow lesion. Cervical spine findings separately reported on same day cervical spine MRI.   Sinuses/Orbits: No mass or acute finding within the imaged orbits. Prior bilateral ocular lens replacement. Trace mucosal thickening within left ethmoid air cells.   Other:  Small-volume fluid within the bilateral mastoid air cells.   IMPRESSION: 1.  No evidence of an acute intracranial abnormality. 2. Multifocal T2 FLAIR hyperintense signal abnormality within the cerebral white matter, overall mild but greater than expected for age. These signal changes have slightly progressed from the prior brain MRI of 05/22/2011. Findings are nonspecific and differential considerations include chronic small vessel ischemic disease, sequelae of chronic migraine headaches, sequelae of a prior infectious/inflammatory process or sequelae of a demyelinating process (such as multiple sclerosis), among others. No lesions specifically suggest demyelinating disease. 3. Small-volume fluid within the bilateral mastoid air cells.  MRI CERVICAL SPINE WITHOUT CONTRAST 06/08/22   TECHNIQUE: Multiplanar, multisequence MR imaging of the  cervical spine was performed. No intravenous contrast was administered.   COMPARISON:  Radiographs of the cervical spine 08/06/2017. Cervical spine MRI 02/24/2017.   FINDINGS: Alignment: Straightening of the expected cervical lordosis. 3 mm C7-T1 grade 1 anterolisthesis.   Vertebrae: Vertebral body height is maintained. Susceptibility artifact arising from ACDF hardware at the C4-C7 levels. No appreciable significant marrow edema or focal suspicious osseous lesion.   Cord: No signal abnormality identified within the cervical spinal cord.   Posterior Fossa, vertebral arteries, paraspinal tissues: Posterior fossa assessed on same-day brain MRI. Flow voids preserved within the imaged cervical vertebral arteries. No paraspinal mass or collection.   Disc levels:   Unless otherwise stated, the level by level findings below have not significantly changed from the prior MRI of 03/06/2017.   Disc degeneration at the non-operative levels, greatest at C3-C4 and C7-T1 (progressed at these levels).   C2-C3: Slight disc bulge. No significant spinal canal or foraminal stenosis.   C3-C4: Disc bulge. New superimposed small central disc protrusion (at site of posterior annular fissure). Uncovertebral hypertrophy on the right, new from the prior MRI. Facet arthrosis. Mild relative spinal canal narrowing (without spinal cord mass effect), new from the prior MRI. Moderate right neural foraminal narrowing, also new from the prior MRI.   C4-C5: Prior ACDF. No significant spinal canal or foraminal stenosis.   C5-C6: Prior ACDF. Uncovertebral hypertrophy on the right. No significant spinal canal stenosis. Mild right neural foraminal narrowing.   C6-C7: Prior ACDF. Uncovertebral vertebrae on the right. No significant spinal canal stenosis. Moderate right neural foraminal narrowing.   C7-T1: 3 mm grade 1 anterolisthesis. Mild disc uncovering. Facet arthrosis. No significant spinal canal  stenosis. Mild-to-moderate bilateral neural foraminal narrowing.   IMPRESSION: Prior C4-C7 ACDF. No significant spinal canal stenosis at these levels. As before, uncovertebral hypertrophy results in neural foraminal narrowing on the right at C5-C6 (mild), and on the right at C6-C7 (moderate).   Cervical spondylosis at the remaining levels, as outlined and with findings most notably as follows.   At C3-C4, there is a disc bulge. New superimposed small central disc protrusion (at site of posterior annular fissure). Uncovertebral hypertrophy on the right, new from the prior MRI. Facet arthrosis. Mild spinal canal narrowing, and moderate right neural foraminal narrowing, new from the prior MRI.   At C7-T1, there is 3 mm grade 1 anterolisthesis. Mild disc uncovering. Facet arthrosis. No significant spinal canal stenosis. Mild-to-moderate bilateral neural foraminal narrowing. Findings at this level have not significantly changed.  COGNITION: Overall cognitive status: Within functional limits for tasks assessed     SENSATION: Not tested  MUSCLE LENGTH: Mild restriction on B hamstrings Moderate restriction on B gastrocsoleus  POSTURE: rounded shoulders and forward head   LOWER EXTREMITY MMT  MMT Right eval Left eval  Hip  flexion 4 4  Hip extension 2+ (bridging) 2+  (bridging)  Hip abduction 4 4  Hip adduction    Hip internal rotation    Hip external rotation    Knee flexion 4- 4-  Knee extension 4- 3+  Ankle dorsiflexion 3+ 4-  Ankle plantarflexion 3+ 3+  Ankle inversion    Ankle eversion     (Blank rows = not tested)  LOWER EXTREMITY ROM  Active ROM Right eval Left eval  Hip flexion [facility]  Hip extension    Hip abduction [facility]  Hip adduction [facility]  Hip internal rotation    Hip external rotation    Knee flexion [facility] WFL  Knee extension [facility]  Ankle dorsiflexion [facility] WFL  Ankle plantarflexion [facility] WFL  Ankle inversion    Ankle eversion     (Blank  rows = not tested)  FUNCTIONAL TESTS:  5 times sit to stand: 26.97 sec (has to hold on to rollator) 2 minute walk test: 224 ft (with a rollator) Tinetti POMA : 9 (balance + gait)  GAIT: Distance walked: 224 ft Assistive device utilized:  rollator Level of assistance: Modified independence Comments: decrease foot clearance on B, decreased step length on B  VESTIBULAR ASSESSMENT (06/21/2022):  Head impulse test: positive with corrective saccades, dizziness reported  VOR 1: corrective saccades, dizziness reported  Optokinetic reflex (R/L): impaired, dizziness reported  TODAY'S TREATMENT:                                                                                                                              DATE:  07/05/2022 Seated:  Cervical neck AROM flex/ext, rot x 10  Horizontal saccades, while reading words x 1'  Vertical saccades, while identifying numbers x 1'  Horizontal smooth pursuit, identifying color/shapes x 1'  Vertical smooth pursuit, identifying animal silhouettes x 1'  VOR 1, 70 bpm, identifying numbers x 1'  VOR cancellation, identifying colors x 1' Gastrocnemius slant board stretch x 30" x 3 Standing on firm surface, normal BOS:  Heel/toe raises with 1 UE support x 10 x 2  Tandem stance x 30" x 2, eyes open, light UE support Static standing, eyes open x 30" x 2 Static standing with head turns x 30" x 2  Rhytmic stabilization x ant/post x 3" x 10 x 2  Mini squats 3" x 10 x 2, no UE support  TKE, RTB x 10 x 2 x 3"  Hip ext x 10 x 2  07/02/22 Sitting: AROM of the cervical spine flexion/extension, rotation x 10 Gastroc stretch with strap 5 x 20" each  Standing: Heel/toe raises 2 x 10 Hip abduction and extension 2 x 10 each Tandem stance 2 x 30" fingertip support Small base of support standing 3 x 30" no UE support Shoulder width stance with light support with head turns and nods x 10 each  06/28/2022 Seated:  Cervical neck AROM flex/ext, rot x  10  Gastrocnemius strap stretch x 30" x 3  Horizontal saccades, while identifying numbers x 1'  Vertical saccades, while reading words x 1'  Horizontal smooth pursuit, identifying animal silhouettes x 1'  Vertical smooth pursuit, identifying colors/shapes x 1'  VOR 1, 70 bpm, identifying numbers x 1'  VOR cancellation, reading words x 1' Gastrocnemius slant board stretch x 30" x 3 Standing on firm surface, normal BOS:  Heel/toe raises with 1 UE support x 10 x 2  Tandem stance x 30" x 2, eyes open, light UE support Static standing, eyes open x 30"  Static standing with head turns x 30"  Rhytmic stabilization x ant/post x 3" x 10 x 2  Mini squats 3" x 10 x 2, no UE support  TKE, RTB x 10 x 2 x 3"  Hip ext x 10 x 2  06/27/2022 Seated:  Cervical neck AROM flex/ext, rot x 10  Gastrocnemius strap stretch x 30" x 3  Horizontal saccades, while identifying numbers x 1'  Vertical saccades, while reading words x 1'  Horizontal smooth pursuit, identifying animal silhouettes x 1'  Vertical smooth pursuit, identifying colors/shapes x 1'  VOR 1, 70 bpm, identifying numbers x 1'  VOR cancellation, reading words x 1' Standing on firm surface, normal BOS:  Heel/toe raises with 1 UE support x 10 x 2  Tandem stance x 30" x 3, eyes open, light UE support Static standing, eyes open x 30"  Static standing with head turns x 30"  Rhytmic stabilization x ant/post x 3" x 10  06/21/2022 Seated:  Cervical neck AROM flex/ext, rot x 10  Horizontal saccades, while identifying numbers x 1'  Vertical saccades, while reading words x 1'  Horizontal smooth pursuit, identifying animal silhouettes x 1'  Vertical smooth pursuit, identifying colors/shapes x 1'  VOR 1, 60 bpm, identifying numbers x 1'  VOR cancellation, identifying colors x 1'  06/18/2022 -Education for pt's explanation of visual disturbances during walking; Attempted VOR assessment but limited Head ROM due to previous surgieies.  -Habituation  exercises daily at home VOR x 1 3x daily. - 1 x 5  elevated dead lift with weighed balls with 10in box  - 2x 5Weighted sit/stands from elevated mat with yellow weighted ball.  2  06/15/22 Evaluation and patient education done    PATIENT EDUCATION:  Education details: Updated HEP. Instructed patient to perform standing exercises on with a stable support (e.g. kitchen counter) and have her husband next to her. Person educated: Patient Education method: Explanation Education comprehension: verbalized understanding  HOME EXERCISE PROGRAM: 07/02/22 hip abduction, small bos standing and regular standing head turns and nods  Access Code: [email or web address] URL: [email or web address]  06/28/2022 - Mini Squat with Counter Support  - 1-2 x daily - 5-7 x weekly - 2 sets - 10 reps - 3 hold - Standing Hip Extension with Counter Support  - 1-2 x daily - 5-7 x weekly - 2 sets - 10 reps  Date: 06/27/2022 Prepared by: Rexene Alberts  Exercises - Seated Calf Stretch with Strap  - 1-2 x daily - 5-7 x weekly - 3 reps - 30 hold - Standing Tandem Balance with Counter Support  - 1-2 x daily - 5-7 x weekly - 30 hold - Heel Toe Raises with Counter Support  - 1-2 x daily - 5-7 x weekly - 2 sets - 10 reps  06/21/2022 GAZE STABILIZATION EXERCISES (2-3x/day, 5-7 days/week, seated) These exercises are designed to improve your eyes' ability to track objects without getting  dizzy. When performing these exercises, make sure you are facing a blank wall (like a white wall) to avoid distraction. You can wear your glasses/contact lenses if you must.   Before anything, warm-up your neck muscles by bending it forward and back 10 times, side-to-side 10 times, and slowly turn your head left and right 10 time Exercise 1 (Horizontal Saccades) Use two cards for this exercise While holding the cards on each hand side by side in sitting with arms outstretched, look at the first item on the other card then look at the next  item on the second card then alternate your gaze sequentially. Do NOT move your cards nor the your head. Do the following for one minute each Identifying numbers Identifying shapes Exercise 2 (Paradigm x1) Use only one card for this exercise While holding the card in sitting with your arm outstretched, turn your head left and right at your own pace while keeping the card stationary.  Do this for one minute each while you are: Identifying numbers Identifying shapes Exercise 3 (VOR cancellation) Use only one card for this exercise While holding the card in sitting with your arms outstretched, turn your body, arm, and head at the same time left and right at your own pace Do this for one minute each while you are: Identifying numbers Identifying shapes  3/11/20204 - VOR x 1 Habituation Exercises 3x daily - Sit/stands w/ 4.5lb weight at home 2 x 5 daily.  ASSESSMENT:  CLINICAL IMPRESSION: Interventions today were geared towards gaze stabilization and balance. Patient tolerated gaze stabilization activities with slight dizziness on the saccades (vertical still worse horizontal), VOR 1 and cancellation exercises. However, patient reports that the dizziness is not as bad as the first time she did the exercises. Demonstrated appropriate levels of fatigue. Rest periods provided. Still has mild to moderate unsteadiness on standing balance activities. Tremors on the LE and sudden body jerks are now less evident with standing exercises. Provided moderate amount of cueing to ensure correct execution of activity with good carry-over. To date, skilled PT is required to address the impairments and improve function    OBJECTIVE IMPAIRMENTS: Abnormal gait, decreased balance, decreased coordination, decreased endurance, difficulty walking, decreased strength, impaired flexibility, and pain.   ACTIVITY LIMITATIONS: carrying, lifting, bending, standing, squatting, stairs, transfers, continence, bathing,  toileting, dressing, self feeding, hygiene/grooming, and locomotion level  PARTICIPATION LIMITATIONS: meal prep, cleaning, laundry, driving, shopping, community activity, and yard work  PERSONAL FACTORS: Fitness level, Bladder implant (will have surgery 07/26/2022), hx of B THR, lobectomy are also affecting patient's functional outcome.   REHAB POTENTIAL: Fair    CLINICAL DECISION MAKING: Evolving/moderate complexity  EVALUATION COMPLEXITY: Moderate   GOALS: Goals reviewed with patient? Yes  SHORT TERM GOALS: Target date: 07/13/2022  Pt will demonstrate indep in HEP to facilitate carry-over of skilled services and improve functional outcomes Goal status: INITIAL  LONG TERM GOALS: Target date: 08/10/2022  Pt will decrease 5TSTS by at least 6 seconds in order to demonstrate clinically significant improvement in LE strength  Baseline: 26.97 sec Goal status: INITIAL  2.  Patient will demonstrate increase in Tinetti Score by 8 points in order to demonstrate clinically significant improvement in balance and decreased risk for falls  Baseline: 9 Goal status: INITIAL  3.  Pt will increase 2MWT by at least 80 ft in order to demonstrate clinically significant improvement in community ambulation Baseline: 224 ft Goal status: INITIAL  4.  Pt will demonstrate increase in LE strength to 4  to facilitate ease and safety in ambulation Baseline: 2+ Goal status: INITIAL  5.  Pt will demonstrate improved LE coordination as manifested by performing 3/4 correct trials in heel-to-shin to facilitate ease in ambulation Baseline: impaired Goal status: INITIAL  6.  Pt will be able to walk with a quad cane for > 100 ft Baseline: walks with a rollator Goal status: INITIAL   PLAN:  PT FREQUENCY: 3x/week  PT DURATION: 8 weeks  PLANNED INTERVENTIONS: Therapeutic exercises, Therapeutic activity, Neuromuscular re-education, Balance training, Gait training, Patient/Family education, Self Care, and  Stair training  PLAN FOR NEXT SESSION: Reassess next visit   8:23 AM, 07/05/22 Chrissie Noa L. Laporscha Linehan, PT, DPT, OCS Board-Certified Clinical Specialist in [place] # ([place]): [id number] [phone]

## 2022-07-08 NOTE — Progress Notes (Unsigned)
NEUROLOGY CONSULTATION NOTE  [name] MRN: [id number] DOB: 1965-06-30  Referring provider: Allyn Kenner, MD Primary care provider: Allyn Kenner, MD  Reason for consult:  gait abnormality  Assessment/Plan:   Vestibulopathy - possible vestibular neuritis.  Head impulse test was positive, suggesting peripheral etiology.  I do not suspect stroke - MRI negative and she does not have any significant cerebrovascular disease.  No lateralizing symptoms on exam.  Will refer her for VNG Continue vestibular exercises Follow up 6 months.  If this is vestibular neuritis, I informed her that it may take months for resolution.     Subjective:  [name] is a 57 year old female with COPD, asthma, hypothyroidism, HLD and history of cervical decompression fusion 2018 and bilateral hip replacement who presents for gait abnormality.  History supplemented by her accompanying husband and prior neurologist's notes.  CT head and MRI of brain and cervical spine personally reviewed.   On 04/12/2022, she woke up and when she got out of bed, she was unable to walk unassisted.  She had to hold onto the walls.  She reports dizziness, like she is moving on a boat, not spinning.  No preceding illness or head injury.  Since then, it has not resolved.  Movement aggravates it.  Denies aural fullness and hearing loss but notes ringing in both ears.  Notes some blurred vision but not double vision.  Gait instability not related to weakness or numbness in the legs.  CT head on 05/22/2022 showed no acute abnormality.  She saw neurology, Dr. Krista Blue, on 05/28/2022.  MRI of brain without contrast on 06/08/2022 showed chronic small vessel ischemic changes within the cerebral white matter bilaterally and small-volume fluid within the bilateral mastoid air cells but again no acute findings.  MRI of cervical spine performed same day showed prior C4-C7 ACDF without significant spinal canal stenosis and otherwise showed cervical  spondylosis with mild spinal canal narrowing and moderate right neural foraminal narrowing at C3-C4 and mild to moderate bilateral neural foraminal narrowing without significant spinal canal stenosis at C7-T1.  She went to vestibular rehab.  The physical therapist's exam revealed positive head impulse test.  Vestibular rehab has been helpful.  Dizziness is no longer persistent.  She performs home exercises as well.  She must ambulate with a rollator.     Labs from 04/11/2022 include Hgb A1c 5.8, TSH 0.129 and T4 1.48.    PAST MEDICAL HISTORY: Past Medical History:  Diagnosis Date   Anemia    Anxiety    Arthritis    Asthma    Cervical radiculopathy    Cluster headaches    COPD (chronic obstructive pulmonary disease) (HCC)    COPD (chronic obstructive pulmonary disease) with chronic bronchitis    GERD (gastroesophageal reflux disease)    Hyperlipemia    Hypoglycemia    Hypothyroidism    Migraine    Pneumothorax    PONV (postoperative nausea and vomiting)    Pre-diabetes    Rectal discharge 07/28/2010   Shortness of breath    Sleep apnea    cannot tolerate-not using CPAP   Sluggishness 07/28/2010    PAST SURGICAL HISTORY: Past Surgical History:  Procedure Laterality Date   ABDOMINAL HYSTERECTOMY     with bladder suspension   CERVICAL FUSION     CHOLECYSTECTOMY     COLONOSCOPY N/A 07/30/2013   Procedure: COLONOSCOPY;  Surgeon: Rogene Houston, MD;  Location: [facility];  Service: Endoscopy;  Laterality: N/A;  930  EXAM UNDER ANESTHESIA WITH MANIPULATION OF SHOULDER Right 10/11/2016   Procedure: EXAM UNDER ANESTHESIA WITH MANIPULATION OF SHOULDER;  Surgeon: Carole Civil, MD;  Location: [facility];  Service: Orthopedics;  Laterality: Right;   HERNIA REPAIR     INCISIONAL HERNIA REPAIR N/A 07/30/2016   Procedure: HERNIA REPAIR INCISIONAL WITH MESH;  Surgeon: Aviva Signs, MD;  Location: [facility];  Service: General;  Laterality: N/A;   INTERSTIM IMPLANT PLACEMENT  02/02/2021    Procedure: Barrie Lyme IMPLANT FIRST STAGE LEFT SACRUM ;  Surgeon: Cleon Gustin, MD;  Location: [facility];  Service: Urology;;   Barrie Lyme IMPLANT PLACEMENT N/A 02/23/2021   Procedure: Barrie Lyme IMPLANT SECOND STAGE;  Surgeon: Cleon Gustin, MD;  Location: [facility];  Service: Urology;  Laterality: N/A;  Rep coming, time needs to stay at 2:00   [place]     right lung    POSTERIOR CERVICAL LAMINECTOMY Right 04/05/2017   Procedure: Right C6-7 Posterior cervical laminectomy;  Surgeon: Kary Kos, MD;  Location: [place];  Service: Neurosurgery;  Laterality: Right;  Right C6-7 Posterior cervical laminectomy   SHOULDER ARTHROSCOPY WITH ROTATOR CUFF REPAIR Right 10/11/2016   Procedure: SHOULDER ARTHROSCOPY;  Surgeon: Carole Civil, MD;  Location: [facility];  Service: Orthopedics;  Laterality: Right;   TOTAL HIP ARTHROPLASTY Right 04/15/2020   Procedure: RIGHT TOTAL HIP ARTHROPLASTY ANTERIOR APPROACH;  Surgeon: Mcarthur Rossetti, MD;  Location: [facility];  Service: Orthopedics;  Laterality: Right;   TOTAL HIP ARTHROPLASTY Left 07/22/2020   Procedure: LEFT  HIP ARTHROPLASTY ANTERIOR APPROACH;  Surgeon: Mcarthur Rossetti, MD;  Location: [facility];  Service: Orthopedics;  Laterality: Left;  RNFA   TUBAL LIGATION      MEDICATIONS: Current Outpatient Medications on File Prior to Visit  Medication Sig Dispense Refill   acetaminophen (TYLENOL) 500 MG tablet Take 1,000 mg by mouth every 6 (six) hours as needed for moderate pain.     albuterol (PROVENTIL) (2.5 MG/3ML) 0.083% nebulizer solution Take 3 mLs (2.5 mg total) by nebulization every 6 (six) hours as needed for wheezing or shortness of breath. 75 mL 1   albuterol (VENTOLIN HFA) 108 (90 Base) MCG/ACT inhaler Inhale 2 puffs into the lungs every 4 (four) hours as needed for shortness of breath. 2 each 2   Benralizumab (FASENRA PEN) 30 MG/ML SOAJ Inject 1 mL (30 mg total) into the skin every 8 (eight) weeks. 1 mL 11    Budeson-Glycopyrrol-Formoterol (BREZTRI AEROSPHERE) 160-9-4.8 MCG/ACT AERO Inhale 2 puffs into the lungs in the morning and at bedtime. 1 each 12   budesonide (PULMICORT) 0.5 MG/2ML nebulizer solution Take 2 mLs (0.5 mg total) by nebulization 2 (two) times daily as needed. (Patient not taking: Reported on 06/06/2022) 2 mL 5   cetirizine (ZYRTEC) 10 MG tablet Take 10 mg by mouth daily.     Cholecalciferol (VITAMIN D3 SUPER STRENGTH PO) Take 5,000 Units by mouth daily.     citalopram (CELEXA) 20 MG tablet Take 20 mg by mouth daily.     gabapentin (NEURONTIN) 100 MG capsule Take by mouth. (Patient not taking: Reported on 06/06/2022)     Lancets (ONETOUCH DELICA PLUS 123XX123) MISC Apply topically.     levothyroxine (SYNTHROID) 75 MCG tablet Take 1 tablet (75 mcg total) by mouth daily before breakfast. (Patient taking differently: Take 50 mcg by mouth daily before breakfast.) 90 tablet 1   LORazepam (ATIVAN) 1 MG tablet Take 1.5 mg by mouth 2 (two) times daily.  montelukast (SINGULAIR) 10 MG tablet Take 1 tablet (10 mg total) by mouth at bedtime. 90 tablet 1   pantoprazole (PROTONIX) 40 MG tablet Take 30- 60 min before your first and last meals of the day (Patient taking differently: Take 40 mg by mouth daily.)     pravastatin (PRAVACHOL) 40 MG tablet Take 40 mg by mouth at bedtime.     predniSONE (DELTASONE) 5 MG tablet Take 1 tablet (5 mg total) by mouth daily with breakfast. 100 tablet 2   pregabalin (LYRICA) 75 MG capsule Take 100 mg by mouth 2 (two) times daily.     Spacer/Aero-Holding Chambers DEVI 1 each by Does not apply route daily as needed. 1 each 2   SUMAtriptan 6 MG/0.5ML SOAJ Inject 6 mg as directed daily as needed (migraine).      Triamcinolone Acetonide (NASACORT ALLERGY 24HR NA) Place 2 sprays into the nose daily as needed (allergies).     valACYclovir (VALTREX) 500 MG tablet Take 500-2,000 mg by mouth See admin instructions. Take 2000mg s at first sign of fever blister outbreak then  take 500mg s daily until gone     Current Facility-Administered Medications on File Prior to Visit  Medication Dose Route Frequency Provider Last Rate Last Admin   Benralizumab SOSY 30 mg  30 mg Subcutaneous Q8 Toney Reil, MD   30 mg at 05/21/22 [id number]    ALLERGIES: Allergies  Allergen Reactions   Aspirin Shortness Of Breath    Causes asthma flares    Penicillins Other (See Comments)    UNSPECIFIED REACTION OF CHILDHOOD  Has patient had a PCN reaction causing immediate rash, facial/tongue/throat swelling, SOB or lightheadedness with hypotension: NO Has patient had a PCN reaction causing severe rash involving mucus membranes or skin necrosis: NO Has patient had a PCN reaction that required hospitalization: no Has patient had a PCN reaction occurring within the last 10 years: NO If all of the above answers are "NO", then may proceed with Cephalosporin use.    Vicodin [Hydrocodone-Acetaminophen] Other (See Comments)    Headaches     FAMILY HISTORY: Family History  Problem Relation Age of Onset   Cancer Mother    COPD Mother    Diabetes Mother    Hyperlipidemia Mother    Hypertension Mother    Hyperlipidemia Father    Hypertension Father    Heart failure Maternal Grandmother    Hypertension Maternal Grandmother    Thyroid disease Maternal Grandmother    Diabetes Paternal Grandmother    Alzheimer's disease Paternal Grandmother    Heart failure Paternal Grandfather    Hypertension Paternal Grandfather    Bipolar disorder Son    Colon cancer Neg Hx     Objective:  Blood pressure 113/66, pulse 77, height 5\' 4"  (1.626 m), weight 142 lb (64.4 kg), SpO2 99 %. General: No acute distress.  Patient appears well-groomed.   Head:  Normocephalic/atraumatic Eyes:  fundi examined but not visualized Neck: supple, no paraspinal tenderness, full range of motion Back: No paraspinal tenderness Heart: regular rate and rhythm Lungs: Clear to auscultation  bilaterally. Vascular: No carotid bruits. Neurological Exam: Mental status: alert and oriented to person, place, and time, speech fluent and not dysarthric, language intact. Cranial nerves: CN I: not tested CN II: pupils equal, round and reactive to light, visual fields intact CN III, IV, VI:  full range of motion, no nystagmus, no ptosis CN V: facial sensation intact. CN VII: upper and lower face symmetric CN VIII: hearing intact CN IX,  X: gag intact, uvula midline CN XI: sternocleidomastoid and trapezius muscles intact CN XII: tongue midline Bulk & Tone: normal, no fasciculations. Motor:  muscle strength 5/5 throughout Sensation:  Pinprick sensation intact and vibratory sensation reduced in feet Deep Tendon Reflexes:  2+ throughout,  toes downgoing.   Finger to nose testing:  Without dysmetria.   Gait:  Ataxic.  Requires assistance to ambulate.      Thank you for allowing me to take part in the care of this patient.  Metta Clines, DO  CC: Allyn Kenner, MD

## 2022-07-09 ENCOUNTER — Encounter: Payer: Self-pay | Admitting: Neurology

## 2022-07-09 ENCOUNTER — Other Ambulatory Visit (HOSPITAL_COMMUNITY): Payer: Self-pay

## 2022-07-09 ENCOUNTER — Ambulatory Visit (INDEPENDENT_AMBULATORY_CARE_PROVIDER_SITE_OTHER): Payer: Medicare Other | Admitting: Neurology

## 2022-07-09 ENCOUNTER — Other Ambulatory Visit: Payer: Self-pay

## 2022-07-09 ENCOUNTER — Other Ambulatory Visit: Payer: Self-pay | Admitting: *Deleted

## 2022-07-09 VITALS — BP 113/66 | HR 77 | Ht 64.0 in | Wt 142.0 lb

## 2022-07-09 DIAGNOSIS — H8190 Unspecified disorder of vestibular function, unspecified ear: Secondary | ICD-10-CM

## 2022-07-09 MED ORDER — FASENRA PEN 30 MG/ML ~~LOC~~ SOAJ
30.0000 mg | SUBCUTANEOUS | 6 refills | Status: DC
  Filled 2022-07-09: qty 1, 56d supply, fill #0
  Filled 2022-08-30: qty 1, 56d supply, fill #1
  Filled 2022-10-30: qty 1, 56d supply, fill #2
  Filled 2022-12-19: qty 1, 56d supply, fill #3
  Filled 2023-02-13: qty 1, 56d supply, fill #4
  Filled 2023-04-09 – 2023-04-17 (×2): qty 1, 56d supply, fill #5
  Filled 2023-05-31: qty 1, 56d supply, fill #6

## 2022-07-09 NOTE — Patient Instructions (Signed)
My suspicion is that you may have had vestibular neuritis, a viral inflammation of the vestibular nerve.  Sometimes the symptoms may last for weeks and will continue resolve over months.    Will order more thorough vestibular testing.  Further recommendations pending results. Continue exercises Follow up after testing.

## 2022-07-10 ENCOUNTER — Ambulatory Visit (HOSPITAL_COMMUNITY): Payer: Medicare Other | Attending: Neurology

## 2022-07-10 DIAGNOSIS — R262 Difficulty in walking, not elsewhere classified: Secondary | ICD-10-CM | POA: Diagnosis present

## 2022-07-10 DIAGNOSIS — M25551 Pain in right hip: Secondary | ICD-10-CM | POA: Insufficient documentation

## 2022-07-10 NOTE — Therapy (Signed)
OUTPATIENT PHYSICAL THERAPY LOWER EXTREMITY PROGRESS/TREATMENT NOTE   Patient Name: [name] MRN: [id number] DOB:December 24, 1965, 57 y.o., female Today's Date: 07/10/2022  Progress Note Reporting Period 06/15/2022 to 07/10/2022  See note below for Objective Data and Assessment of Progress/Goals.     END OF SESSION:  PT End of Session - 07/10/22 1141     Visit Number 8    Number of Visits 20    Date for PT Re-Evaluation 08/10/22    Authorization Type BCBS Medicare, Medicaid of [place]    Progress Note Due on Visit 20    PT Start Time 1115    PT Stop Time 1200    PT Time Calculation (min) 45 min    Equipment Utilized During Treatment Gait belt;Oxygen    Activity Tolerance Patient tolerated treatment well    Behavior During Therapy WFL for tasks assessed/performed            Past Medical History:  Diagnosis Date   Anemia    Anxiety    Arthritis    Asthma    Cervical radiculopathy    Cluster headaches    COPD (chronic obstructive pulmonary disease)    COPD (chronic obstructive pulmonary disease) with chronic bronchitis    GERD (gastroesophageal reflux disease)    Hyperlipemia    Hypoglycemia    Hypothyroidism    Migraine    Pneumothorax    PONV (postoperative nausea and vomiting)    Pre-diabetes    Rectal discharge 07/28/2010   Shortness of breath    Sleep apnea    cannot tolerate-not using CPAP   Sluggishness 07/28/2010   Past Surgical History:  Procedure Laterality Date   ABDOMINAL HYSTERECTOMY     with bladder suspension   CERVICAL FUSION     CHOLECYSTECTOMY     COLONOSCOPY N/A 07/30/2013   Procedure: COLONOSCOPY;  Surgeon: Rogene Houston, MD;  [facility];  Service: Endoscopy;  Laterality: N/A;  [place] OF SHOULDER Right 10/11/2016   Procedure: EXAM UNDER ANESTHESIA WITH MANIPULATION OF SHOULDER;  Surgeon: Carole Civil, MD;  Location: [facility];  Service: Orthopedics;  Laterality: Right;   HERNIA  REPAIR     INCISIONAL HERNIA REPAIR N/A 07/30/2016   Procedure: HERNIA REPAIR INCISIONAL WITH MESH;  Surgeon: Aviva Signs, MD;  Location: [facility];  Service: General;  Laterality: N/A;   INTERSTIM IMPLANT PLACEMENT  02/02/2021   Procedure: Barrie Lyme IMPLANT FIRST STAGE LEFT SACRUM ;  Surgeon: Cleon Gustin, MD;  Location: [facility];  Service: Urology;;   Barrie Lyme IMPLANT PLACEMENT N/A 02/23/2021   Procedure: Barrie Lyme IMPLANT SECOND STAGE;  Surgeon: Cleon Gustin, MD;  Location: [facility];  Service: Urology;  Laterality: N/A;  Rep coming, time needs to stay at 2:00   [place]     right lung    POSTERIOR CERVICAL LAMINECTOMY Right 04/05/2017   Procedure: Right C6-7 Posterior cervical laminectomy;  Surgeon: Kary Kos, MD;  Location: [facility];  Service: Neurosurgery;  Laterality: Right;  Right C6-7 Posterior cervical laminectomy   SHOULDER ARTHROSCOPY WITH ROTATOR CUFF REPAIR Right 10/11/2016   Procedure: SHOULDER ARTHROSCOPY;  Surgeon: Carole Civil, MD;  Location: [facility];  Service: Orthopedics;  Laterality: Right;   TOTAL HIP ARTHROPLASTY Right 04/15/2020   Procedure: RIGHT TOTAL HIP ARTHROPLASTY ANTERIOR APPROACH;  Surgeon: Mcarthur Rossetti, MD;  Location: [facility];  Service: Orthopedics;  Laterality: Right;   TOTAL HIP ARTHROPLASTY Left 07/22/2020  Procedure: LEFT  HIP ARTHROPLASTY ANTERIOR APPROACH;  Surgeon: Mcarthur Rossetti, MD;  Location: [facility];  Service: Orthopedics;  Laterality: Left;  RNFA   TUBAL LIGATION     Patient Active Problem List   Diagnosis Date Noted   Severe persistent asthma without complication [id number]   Gait abnormality 05/28/2022   Adrenal insufficiency 03/30/2022   Endocrine disorder, unspecified 07/04/2021   Prediabetes 07/04/2021   Hypothyroidism 07/04/2021   Former smoker 06/29/2021   Seasonal and perennial allergic rhinitis 06/19/2021   Oxygen dependent 06/19/2021   Steroid dependent 06/19/2021    Immunosuppressed status 06/19/2021   Radiculopathy, lumbar region 12/08/2020   Status post left hip replacement 07/22/2020   Status post right hip replacement 05/02/2020   Status post total replacement of right hip 04/15/2020   Avascular necrosis of bone of left hip 02/10/2020   Avascular necrosis of bone of right hip 02/10/2020   Allergic rhinitis 05/21/2019   Asthma-COPD overlap syndrome 04/30/2019   Chronic respiratory failure with hypoxia 04/30/2019   Spinal stenosis of cervical region 04/05/2017   Partial tear of right subscapularis tendon    Incisional hernia, without obstruction or gangrene    Urinary frequency 03/21/2012   Migraine equivalent syndrome 03/21/2012   Insomnia 03/21/2012    PCP: Celene Squibb, MD  REFERRING PROVIDER: Marcial Pacas, MD  REFERRING DIAG: R26.9 (ICD-10-CM) - Gait abnormality  THERAPY DIAG:  Difficulty in walking, not elsewhere classified  Rationale for Evaluation and Treatment: Rehabilitation  ONSET DATE: April 12, 2022  SUBJECTIVE:   SUBJECTIVE STATEMENT: Patient states that she went to her neurologist and was diagnosed with vestibular neuritis. Patient says she will be referred to a neurologist. Patient states that she's still dizzy ("wavy" sensatiob) but not as bad as she used to be. Patient states that her balance is still off but has improved a little since she started PT. Denies any recent falls. Patient thinks she has improved around 25%. With this, patient wishes to continue with PT.   PERTINENT HISTORY: Bladder implant (will have surgery 07/26/2022), hx of B THR, lobectomy PAIN:  Are you having pain? No  PRECAUTIONS: Fall and Other: uses portable O2  WEIGHT BEARING RESTRICTIONS: No  FALLS:  Has patient fallen in last 6 months? Yes. Number of falls 7  LIVING ENVIRONMENT: Lives with: lives with their spouse Lives in: House/apartment Stairs: Yes: External: 1 steps; none Has following equipment at home: Single point cane, Walker  - 2 wheeled, Environmental consultant - 4 wheeled, shower chair, bed side commode, and oxygen  OCCUPATION: on disability  PLOF: Independent with household mobility with device and Requires assistive device for independence  PATIENT GOALS: "to be able to walk straight by myself"  NEXT MD VISIT: Aug 30, 2022  OBJECTIVE:   DIAGNOSTIC FINDINGS:  MRI HEAD WITHOUT CONTRAST 06/08/22   TECHNIQUE: Multiplanar, multiecho pulse sequences of the brain and surrounding structures were obtained without intravenous contrast.   COMPARISON:  Head CT 05/22/2022. Brain MRI 05/22/2011.   FINDINGS: Brain:   No age advanced or lobar predominant parenchymal atrophy.   Multifocal T2 FLAIR hyperintense signal abnormality within the cerebral white matter, overall mild but greater than expected for age.   There is no acute infarct.   No evidence of an intracranial mass.   No chronic intracranial blood products.   No extra-axial fluid collection.   No midline shift.   Vascular: Maintained flow voids within the proximal large arterial vessels.   Skull and upper cervical spine: No focal suspicious  calvarial marrow lesion. Cervical spine findings separately reported on same day cervical spine MRI.   Sinuses/Orbits: No mass or acute finding within the imaged orbits. Prior bilateral ocular lens replacement. Trace mucosal thickening within left ethmoid air cells.   Other: Small-volume fluid within the bilateral mastoid air cells.   IMPRESSION: 1.  No evidence of an acute intracranial abnormality. 2. Multifocal T2 FLAIR hyperintense signal abnormality within the cerebral white matter, overall mild but greater than expected for age. These signal changes have slightly progressed from the prior brain MRI of 05/22/2011. Findings are nonspecific and differential considerations include chronic small vessel ischemic disease, sequelae of chronic migraine headaches, sequelae of a prior infectious/inflammatory process or  sequelae of a demyelinating process (such as multiple sclerosis), among others. No lesions specifically suggest demyelinating disease. 3. Small-volume fluid within the bilateral mastoid air cells.  MRI CERVICAL SPINE WITHOUT CONTRAST 06/08/22   TECHNIQUE: Multiplanar, multisequence MR imaging of the cervical spine was performed. No intravenous contrast was administered.   COMPARISON:  Radiographs of the cervical spine 08/06/2017. Cervical spine MRI 02/24/2017.   FINDINGS: Alignment: Straightening of the expected cervical lordosis. 3 mm C7-T1 grade 1 anterolisthesis.   Vertebrae: Vertebral body height is maintained. Susceptibility artifact arising from ACDF hardware at the C4-C7 levels. No appreciable significant marrow edema or focal suspicious osseous lesion.   Cord: No signal abnormality identified within the cervical spinal cord.   Posterior Fossa, vertebral arteries, paraspinal tissues: Posterior fossa assessed on same-day brain MRI. Flow voids preserved within the imaged cervical vertebral arteries. No paraspinal mass or collection.   Disc levels:   Unless otherwise stated, the level by level findings below have not significantly changed from the prior MRI of 03/06/2017.   Disc degeneration at the non-operative levels, greatest at C3-C4 and C7-T1 (progressed at these levels).   C2-C3: Slight disc bulge. No significant spinal canal or foraminal stenosis.   C3-C4: Disc bulge. New superimposed small central disc protrusion (at site of posterior annular fissure). Uncovertebral hypertrophy on the right, new from the prior MRI. Facet arthrosis. Mild relative spinal canal narrowing (without spinal cord mass effect), new from the prior MRI. Moderate right neural foraminal narrowing, also new from the prior MRI.   C4-C5: Prior ACDF. No significant spinal canal or foraminal stenosis.   C5-C6: Prior ACDF. Uncovertebral hypertrophy on the right. No significant spinal canal  stenosis. Mild right neural foraminal narrowing.   C6-C7: Prior ACDF. Uncovertebral vertebrae on the right. No significant spinal canal stenosis. Moderate right neural foraminal narrowing.   C7-T1: 3 mm grade 1 anterolisthesis. Mild disc uncovering. Facet arthrosis. No significant spinal canal stenosis. Mild-to-moderate bilateral neural foraminal narrowing.   IMPRESSION: Prior C4-C7 ACDF. No significant spinal canal stenosis at these levels. As before, uncovertebral hypertrophy results in neural foraminal narrowing on the right at C5-C6 (mild), and on the right at C6-C7 (moderate).   Cervical spondylosis at the remaining levels, as outlined and with findings most notably as follows.   At C3-C4, there is a disc bulge. New superimposed small central disc protrusion (at site of posterior annular fissure). Uncovertebral hypertrophy on the right, new from the prior MRI. Facet arthrosis. Mild spinal canal narrowing, and moderate right neural foraminal narrowing, new from the prior MRI.   At C7-T1, there is 3 mm grade 1 anterolisthesis. Mild disc uncovering. Facet arthrosis. No significant spinal canal stenosis. Mild-to-moderate bilateral neural foraminal narrowing. Findings at this level have not significantly changed.  COGNITION: Overall cognitive status: Within functional limits for  tasks assessed     SENSATION: Not tested  MUSCLE LENGTH: (not tested on 07/10/22) Mild restriction on B hamstrings Moderate restriction on B gastrocsoleus  POSTURE: rounded shoulders and forward head   LOWER EXTREMITY MMT  MMT Right eval Left eval Right 07/10/22 Left 07/10/22  Hip flexion 4 4 4 4   Hip extension 2+ (bridging) 2+  (bridging) 3+  (bridging) 3+ (bridging)  Hip abduction 4 4 4 4   Hip adduction      Hip internal rotation      Hip external rotation      Knee flexion 4- 4- 4 4  Knee extension 4- 3+ 4 4  Ankle dorsiflexion 3+ 4- 4- 4-  Ankle plantarflexion 3+ 3+ 3+ 3+  Ankle  inversion      Ankle eversion       (Blank rows = not tested)  LOWER EXTREMITY ROM  Active ROM Right eval Left eval  Hip flexion [facility]  Hip extension    Hip abduction [facility]  Hip adduction [facility]  Hip internal rotation    Hip external rotation    Knee flexion [facility] WFL  Knee extension [facility]  Ankle dorsiflexion [facility] WFL  Ankle plantarflexion [facility] WFL  Ankle inversion    Ankle eversion     (Blank rows = not tested)  FUNCTIONAL TESTS:  5 times sit to stand: 18.88 sec (little assist from rollator) from 26.97 sec (has to hold on to rollator) 2 minute walk test: 304 ft (with rollator) from 224 ft (with a rollator) Tinetti POMA : 14 from 9 (balance + gait)  GAIT: Distance walked: 304 ft Assistive device utilized:  rollator Level of assistance: Modified independence Comments: foot clearance on B more evident, normal step length on B  VESTIBULAR ASSESSMENT  06/21/22 07/10/22  Head impulse test positive with corrective saccades, dizziness reported positive with corrective saccades, dizziness reported  VOR 1 corrective saccades, dizziness reported corrective saccades, dizziness reported  Optokinetic reflex (R/L)  impaired, dizziness reported  impaired, dizziness reported    TODAY'S TREATMENT:                                                                                                                              DATE:  07/10/2022 Progress note (2MWT, Tinetti, vestibular exam, ROM, MMT, 5TSTS) Cervical neck AROM flex/ext, rot x 10 Standing, firm surface, normal BOS:  Horizontal saccades, while reading words x 1'  Vertical saccades, while identifying numbers x 1'  Horizontal smooth pursuit, identifying color/shapes x 1'  Vertical smooth pursuit, identifying animal silhouettes x 1'  VOR 1, own pace, identifying numbers x 1'  07/05/2022 Seated:  Cervical neck AROM flex/ext, rot x 10  Horizontal saccades, while reading words x 1'  Vertical saccades, while identifying  numbers x 1'  Horizontal smooth pursuit, identifying color/shapes x 1'  Vertical smooth pursuit, identifying animal silhouettes x 1'  VOR 1, 70 bpm, identifying numbers x 1'  VOR  cancellation, identifying colors x 1' Gastrocnemius slant board stretch x 30" x 3 Standing on firm surface, normal BOS:  Heel/toe raises with 1 UE support x 10 x 2  Tandem stance x 30" x 2, eyes open, light UE support Static standing, eyes open x 30" x 2 Static standing with head turns x 30" x 2  Rhytmic stabilization x ant/post x 3" x 10 x 2  Mini squats 3" x 10 x 2, no UE support  TKE, RTB x 10 x 2 x 3"  Hip ext x 10 x 2  07/02/22 Sitting: AROM of the cervical spine flexion/extension, rotation x 10 Gastroc stretch with strap 5 x 20" each  Standing: Heel/toe raises 2 x 10 Hip abduction and extension 2 x 10 each Tandem stance 2 x 30" fingertip support Small base of support standing 3 x 30" no UE support Shoulder width stance with light support with head turns and nods x 10 each  06/28/2022 Seated:  Cervical neck AROM flex/ext, rot x 10  Gastrocnemius strap stretch x 30" x 3  Horizontal saccades, while identifying numbers x 1'  Vertical saccades, while reading words x 1'  Horizontal smooth pursuit, identifying animal silhouettes x 1'  Vertical smooth pursuit, identifying colors/shapes x 1'  VOR 1, 70 bpm, identifying numbers x 1'  VOR cancellation, reading words x 1' Gastrocnemius slant board stretch x 30" x 3 Standing on firm surface, normal BOS:  Heel/toe raises with 1 UE support x 10 x 2  Tandem stance x 30" x 2, eyes open, light UE support Static standing, eyes open x 30"  Static standing with head turns x 30"  Rhytmic stabilization x ant/post x 3" x 10 x 2  Mini squats 3" x 10 x 2, no UE support  TKE, RTB x 10 x 2 x 3"  Hip ext x 10 x 2  06/27/2022 Seated:  Cervical neck AROM flex/ext, rot x 10  Gastrocnemius strap stretch x 30" x 3  Horizontal saccades, while identifying numbers x  1'  Vertical saccades, while reading words x 1'  Horizontal smooth pursuit, identifying animal silhouettes x 1'  Vertical smooth pursuit, identifying colors/shapes x 1'  VOR 1, 70 bpm, identifying numbers x 1'  VOR cancellation, reading words x 1' Standing on firm surface, normal BOS:  Heel/toe raises with 1 UE support x 10 x 2  Tandem stance x 30" x 3, eyes open, light UE support Static standing, eyes open x 30"  Static standing with head turns x 30"  Rhytmic stabilization x ant/post x 3" x 10  06/21/2022 Seated:  Cervical neck AROM flex/ext, rot x 10  Horizontal saccades, while identifying numbers x 1'  Vertical saccades, while reading words x 1'  Horizontal smooth pursuit, identifying animal silhouettes x 1'  Vertical smooth pursuit, identifying colors/shapes x 1'  VOR 1, 60 bpm, identifying numbers x 1'  VOR cancellation, identifying colors x 1'  06/18/2022 -Education for pt's explanation of visual disturbances during walking; Attempted VOR assessment but limited Head ROM due to previous surgieies.  -Habituation exercises daily at home VOR x 1 3x daily. - 1 x 5  elevated dead lift with weighed balls with 10in box  - 2x 5Weighted sit/stands from elevated mat with yellow weighted ball.  2  06/15/22 Evaluation and patient education done    PATIENT EDUCATION:  Education details: Updated HEP. Instructed patient to perform standing exercises on with a stable support (e.g. kitchen counter) and have her husband next to  her. Person educated: Patient Education method: Explanation Education comprehension: verbalized understanding  HOME EXERCISE PROGRAM: 07/02/22 hip abduction, small bos standing and regular standing head turns and nods  Access Code: [email or web address] URL: [email or web address]  06/28/2022 - Mini Squat with Counter Support  - 1-2 x daily - 5-7 x weekly - 2 sets - 10 reps - 3 hold - Standing Hip Extension with Counter Support  - 1-2 x daily - 5-7 x weekly - 2  sets - 10 reps  Date: 06/27/2022 Prepared by: Rexene Alberts  Exercises - Seated Calf Stretch with Strap  - 1-2 x daily - 5-7 x weekly - 3 reps - 30 hold - Standing Tandem Balance with Counter Support  - 1-2 x daily - 5-7 x weekly - 30 hold - Heel Toe Raises with Counter Support  - 1-2 x daily - 5-7 x weekly - 2 sets - 10 reps  06/21/2022 GAZE STABILIZATION EXERCISES (2-3x/day, 5-7 days/week, seated) These exercises are designed to improve your eyes' ability to track objects without getting dizzy. When performing these exercises, make sure you are facing a blank wall (like a white wall) to avoid distraction. You can wear your glasses/contact lenses if you must.   Before anything, warm-up your neck muscles by bending it forward and back 10 times, side-to-side 10 times, and slowly turn your head left and right 10 time Exercise 1 (Horizontal Saccades) Use two cards for this exercise While holding the cards on each hand side by side in sitting with arms outstretched, look at the first item on the other card then look at the next item on the second card then alternate your gaze sequentially. Do NOT move your cards nor the your head. Do the following for one minute each Identifying numbers Identifying shapes Exercise 2 (Paradigm x1) Use only one card for this exercise While holding the card in sitting with your arm outstretched, turn your head left and right at your own pace while keeping the card stationary.  Do this for one minute each while you are: Identifying numbers Identifying shapes Exercise 3 (VOR cancellation) Use only one card for this exercise While holding the card in sitting with your arms outstretched, turn your body, arm, and head at the same time left and right at your own pace Do this for one minute each while you are: Identifying numbers Identifying shapes  3/11/20204 - VOR x 1 Habituation Exercises 3x daily - Sit/stands w/ 4.5lb weight at home 2 x 5  daily.  ASSESSMENT:  CLINICAL IMPRESSION: Patient demonstrated continued improvements in function as indicated by positive significant changes in 2MWT, 5TSTS, and strength. However, patient still presents with deficits in vestibular function which greatly affects her standing balance. Patient still presents as a falls risk based on the Tinetti score. With this, skilled PT is still required to address the impairments and functional limitations listed below.  Interventions today were geared towards gaze stabilization incorporated with her standing balance. Patient tolerated gaze stabilization activities with mild dizziness and unsteadiness on the saccades, VOR 1 and cancellation exercises. Demonstrated appropriate levels of fatigue. Rest periods provided. Provided slight amount of cueing to ensure correct execution of activity with good carry-over.    OBJECTIVE IMPAIRMENTS: Abnormal gait, decreased balance, decreased coordination, decreased endurance, difficulty walking, decreased strength, impaired flexibility, and pain.   ACTIVITY LIMITATIONS: carrying, lifting, bending, standing, squatting, stairs, transfers, continence, bathing, toileting, dressing, self feeding, hygiene/grooming, and locomotion level  PARTICIPATION LIMITATIONS: meal prep, cleaning, laundry, driving, shopping, community activity, and yard  work  PERSONAL FACTORS: Fitness level, Bladder implant (will have surgery 07/26/2022), hx of B THR, lobectomy are also affecting patient's functional outcome.   REHAB POTENTIAL: Fair    CLINICAL DECISION MAKING: Evolving/moderate complexity  EVALUATION COMPLEXITY: Moderate   GOALS: Goals reviewed with patient? Yes  SHORT TERM GOALS: Target date: 07/13/2022  Pt will demonstrate indep in HEP to facilitate carry-over of skilled services and improve functional outcomes Goal status: MET  LONG TERM GOALS: Target date: 08/10/2022  Pt will be report little to no dizziness on VOR 1 and  head impulse test to facilitate safety on ambulation Baseline: with dizziness Goal status: INITIAL/NEW   Pt will decrease 5TSTS by at least 5 seconds in order to demonstrate clinically significant improvement in LE strength  Baseline: 18.88 sec Goal status: REVISED  3.  Patient will demonstrate increase in Tinetti Score by 8 points in order to demonstrate clinically significant improvement in balance and decreased risk for falls  Baseline: 9 Goal status: IN PROGRESS  4.  Pt will increase 2MWT by at least 40 ft in order to demonstrate clinically significant improvement in community ambulation Baseline: 304 ft Goal status: REVISED  5.  Pt will demonstrate increase in LE strength to 4 to facilitate ease and safety in ambulation Baseline: 2+ Goal status: IN PROGRESS  6.  Pt will demonstrate improved LE coordination as manifested by performing 3/4 correct trials in heel-to-shin to facilitate ease in ambulation Baseline: impaired Goal status: INITIAL  7.  Pt will be able to walk with a quad cane for > 50 ft Baseline: walks with a rollator Goal status: REVISED   PLAN:  PT FREQUENCY: 3x/week  PT DURATION: 4 weeks  PLANNED INTERVENTIONS: Therapeutic exercises, Therapeutic activity, Neuromuscular re-education, Balance training, Gait training, Patient/Family education, Self Care, and Stair training  PLAN FOR NEXT SESSION: Continue POC and may progress as tolerated with emphasis on balance, LE strengthening, flexibility, and vestibular function.   1:25 PM, 07/10/22 Harvie Heck. Darlene Brozowski, PT, DPT, OCS Board-Certified Clinical Specialist in [place] # ([place]): [phone]

## 2022-07-12 ENCOUNTER — Ambulatory Visit (HOSPITAL_COMMUNITY): Payer: Medicare Other

## 2022-07-12 DIAGNOSIS — M25551 Pain in right hip: Secondary | ICD-10-CM

## 2022-07-12 DIAGNOSIS — R262 Difficulty in walking, not elsewhere classified: Secondary | ICD-10-CM | POA: Diagnosis not present

## 2022-07-12 NOTE — Therapy (Addendum)
OUTPATIENT PHYSICAL THERAPY LOWER EXTREMITY TREATMENT NOTE   Patient Name: [name] MRN: [id number] DOB:[birth year], 57 y.o., female Today's Date: 07/12/2022  END OF SESSION:  PT End of Session - 07/12/22 1257     Visit Number 9    Number of Visits 20    Date for PT Re-Evaluation 08/10/22    Authorization Type BCBS Medicare, Medicaid of [place]    Progress Note Due on Visit 20    PT Start Time 1300    PT Stop Time 1340    PT Time Calculation (min) 40 min    Equipment Utilized During Treatment Gait belt;Oxygen    Activity Tolerance Patient tolerated treatment well    Behavior During Therapy WFL for tasks assessed/performed            Past Medical History:  Diagnosis Date   Anemia    Anxiety    Arthritis    Asthma    Cervical radiculopathy    Cluster headaches    COPD (chronic obstructive pulmonary disease)    COPD (chronic obstructive pulmonary disease) with chronic bronchitis    GERD (gastroesophageal reflux disease)    Hyperlipemia    Hypoglycemia    Hypothyroidism    Migraine    Pneumothorax    PONV (postoperative nausea and vomiting)    Pre-diabetes    Rectal discharge 07/28/2010   Shortness of breath    Sleep apnea    cannot tolerate-not using CPAP   Sluggishness 07/28/2010   Past Surgical History:  Procedure Laterality Date   ABDOMINAL HYSTERECTOMY     with bladder suspension   CERVICAL FUSION     CHOLECYSTECTOMY     COLONOSCOPY N/A 07/30/2013   Procedure: COLONOSCOPY;  Surgeon: Rogene Houston, MD;  [facility]: [facility];  Service: Endoscopy;  Laterality: N/A;  [place] OF SHOULDER Right 10/11/2016   Procedure: EXAM UNDER ANESTHESIA WITH MANIPULATION OF SHOULDER;  Surgeon: Carole Civil, MD;  Location: [facility];  Service: Orthopedics;  Laterality: Right;   HERNIA REPAIR     INCISIONAL HERNIA REPAIR N/A 07/30/2016   Procedure: HERNIA REPAIR INCISIONAL WITH MESH;  Surgeon: Aviva Signs, MD;  Location: [facility];  Service: General;  Laterality: N/A;   INTERSTIM IMPLANT PLACEMENT  02/02/2021   Procedure: Barrie [place]yme IMPLANT FIRST STAGE LEFT SACRUM ;  Surgeon: Cleon Gustin, MD;  Location: [facility];  Service: Urology;;   Barrie Lyme IMPLANT PLACEMENT N/A 02/23/2021   Procedure: Barrie Lyme IMPLANT SECOND STAGE;  Surgeon: Cleon Gustin, MD;  Location: [facility];  Service: Urology;  Laterality: N/A;  Rep coming, time needs to stay at 2:00   [place]     right lung    POSTERIOR CERVICAL LAMINECTOMY Right 04/05/2017   Procedure: Right C6-7 Posterior cervical laminectomy;  Surgeon: Kary Kos, MD;  Location: [facility];  Service: Neurosurgery;  Laterality: Right;  Right C6-7 Posterior cervical laminectomy   SHOULDER ARTHROSCOPY WITH ROTATOR CUFF REPAIR Right 10/11/2016   Procedure: SHOULDER ARTHROSCOPY;  Surgeon: Carole Civil, MD;  Location: [facility];  Service: Orthopedics;  Laterality: Right;   TOTAL HIP ARTHROPLASTY Right 04/15/2020   Procedure: RIGHT TOTAL HIP ARTHROPLASTY ANTERIOR APPROACH;  Surgeon: Mcarthur Rossetti, MD;  Location: [facility];  Service: Orthopedics;  Laterality: Right;   TOTAL HIP ARTHROPLASTY Left 07/22/2020   Procedure: LEFT  HIP ARTHROPLASTY ANTERIOR APPROACH;  Surgeon: Mcarthur Rossetti, MD;  Location: [facility];  Service: Orthopedics;  Laterality: Left;  RNFA   TUBAL LIGATION     Patient Active Problem List   Diagnosis Date Noted   Severe persistent asthma without complication [id number]   Gait abnormality 05/28/2022   Adrenal insufficiency 03/30/2022   Endocrine disorder, unspecified 07/04/2021   Prediabetes 07/04/2021   Hypothyroidism 07/04/2021   Former smoker 06/29/2021   Seasonal and perennial allergic rhinitis 06/19/2021   Oxygen dependent 06/19/2021   Steroid dependent 06/19/2021   Immunosuppressed status 06/19/2021   Radiculopathy, lumbar region 12/08/2020   Status post left hip replacement 07/22/2020   Status post right hip  replacement 05/02/2020   Status post total replacement of right hip 04/15/2020   Avascular necrosis of bone of left hip 02/10/2020   Avascular necrosis of bone of right hip 02/10/2020   Allergic rhinitis 05/21/2019   Asthma-COPD overlap syndrome 04/30/2019   Chronic respiratory failure with hypoxia 04/30/2019   Spinal stenosis of cervical region 04/05/2017   Partial tear of right subscapularis tendon    Incisional hernia, without obstruction or gangrene    Urinary frequency 03/21/2012   Migraine equivalent syndrome 03/21/2012   Insomnia 03/21/2012    PCP: Celene Squibb, MD  REFERRING PROVIDER: Marcial Pacas, MD  REFERRING DIAG: R26.9 (ICD-10-CM) - Gait abnormality  THERAPY DIAG:  Difficulty in walking, not elsewhere classified  Pain in right hip  Rationale for Evaluation and Treatment: Rehabilitation  ONSET DATE: April 12, 2022  SUBJECTIVE:   SUBJECTIVE STATEMENT: Doing well today. Almost had an episode of fall when she was doing her exercises in standing with head turns.   PROGRESS NOTE 07/10/22: Patient states that she went to her neurologist and was diagnosed with vestibular neuritis. Patient says she will be referred to a neurologist. Patient states that she's still dizzy ("wavy" sensatiob) but not as bad as she used to be. Patient states that her balance is still off but has improved a little since she started PT. Denies any recent falls. Patient thinks she has improved around 25%. With this, patient wishes to continue with PT.   PERTINENT HISTORY: Bladder implant (will have surgery 07/26/2022), hx of B THR, lobectomy PAIN:  Are you having pain? No  PRECAUTIONS: Fall and Other: uses portable O2  WEIGHT BEARING RESTRICTIONS: No  FALLS:  Has patient fallen in last 6 months? Yes. Number of falls 7  LIVING ENVIRONMENT: Lives with: lives with their spouse Lives in: House/apartment Stairs: Yes: External: 1 steps; none Has following equipment at home: Single point cane,  Walker - 2 wheeled, Environmental consultant - 4 wheeled, shower chair, bed side commode, and oxygen  OCCUPATION: on disability  PLOF: Independent with household mobility with device and Requires assistive device for independence  PATIENT GOALS: "to be able to walk straight by myself"  NEXT MD VISIT: Aug 30, 2022  OBJECTIVE:   DIAGNOSTIC FINDINGS:  MRI HEAD WITHOUT CONTRAST 06/08/22   TECHNIQUE: Multiplanar, multiecho pulse sequences of the brain and surrounding structures were obtained without intravenous contrast.   COMPARISON:  Head CT 05/22/2022. Brain MRI 05/22/2011.   FINDINGS: Brain:   No age advanced or lobar predominant parenchymal atrophy.   Multifocal T2 FLAIR hyperintense signal abnormality within the cerebral white matter, overall mild but greater than expected for age.   There is no acute infarct.   No evidence of an intracranial mass.   No chronic intracranial blood products.   No extra-axial fluid collection.   No midline shift.   Vascular: Maintained flow voids within the proximal large arterial vessels.  Skull and upper cervical spine: No focal suspicious calvarial marrow lesion. Cervical spine findings separately reported on same day cervical spine MRI.   Sinuses/Orbits: No mass or acute finding within the imaged orbits. Prior bilateral ocular lens replacement. Trace mucosal thickening within left ethmoid air cells.   Other: Small-volume fluid within the bilateral mastoid air cells.   IMPRESSION: 1.  No evidence of an acute intracranial abnormality. 2. Multifocal T2 FLAIR hyperintense signal abnormality within the cerebral white matter, overall mild but greater than expected for age. These signal changes have slightly progressed from the prior brain MRI of 05/22/2011. Findings are nonspecific and differential considerations include chronic small vessel ischemic disease, sequelae of chronic migraine headaches, sequelae of a prior infectious/inflammatory  process or sequelae of a demyelinating process (such as multiple sclerosis), among others. No lesions specifically suggest demyelinating disease. 3. Small-volume fluid within the bilateral mastoid air cells.  MRI CERVICAL SPINE WITHOUT CONTRAST 06/08/22   TECHNIQUE: Multiplanar, multisequence MR imaging of the cervical spine was performed. No intravenous contrast was administered.   COMPARISON:  Radiographs of the cervical spine 08/06/2017. Cervical spine MRI 02/24/2017.   FINDINGS: Alignment: Straightening of the expected cervical lordosis. 3 mm C7-T1 grade 1 anterolisthesis.   Vertebrae: Vertebral body height is maintained. Susceptibility artifact arising from ACDF hardware at the C4-C7 levels. No appreciable significant marrow edema or focal suspicious osseous lesion.   Cord: No signal abnormality identified within the cervical spinal cord.   Posterior Fossa, vertebral arteries, paraspinal tissues: Posterior fossa assessed on same-day brain MRI. Flow voids preserved within the imaged cervical vertebral arteries. No paraspinal mass or collection.   Disc levels:   Unless otherwise stated, the level by level findings below have not significantly changed from the prior MRI of 03/06/2017.   Disc degeneration at the non-operative levels, greatest at C3-C4 and C7-T1 (progressed at these levels).   C2-C3: Slight disc bulge. No significant spinal canal or foraminal stenosis.   C3-C4: Disc bulge. New superimposed small central disc protrusion (at site of posterior annular fissure). Uncovertebral hypertrophy on the right, new from the prior MRI. Facet arthrosis. Mild relative spinal canal narrowing (without spinal cord mass effect), new from the prior MRI. Moderate right neural foraminal narrowing, also new from the prior MRI.   C4-C5: Prior ACDF. No significant spinal canal or foraminal stenosis.   C5-C6: Prior ACDF. Uncovertebral hypertrophy on the right. No significant  spinal canal stenosis. Mild right neural foraminal narrowing.   C6-C7: Prior ACDF. Uncovertebral vertebrae on the right. No significant spinal canal stenosis. Moderate right neural foraminal narrowing.   C7-T1: 3 mm grade 1 anterolisthesis. Mild disc uncovering. Facet arthrosis. No significant spinal canal stenosis. Mild-to-moderate bilateral neural foraminal narrowing.   IMPRESSION: Prior C4-C7 ACDF. No significant spinal canal stenosis at these levels. As before, uncovertebral hypertrophy results in neural foraminal narrowing on the right at C5-C6 (mild), and on the right at C6-C7 (moderate).   Cervical spondylosis at the remaining levels, as outlined and with findings most notably as follows.   At C3-C4, there is a disc bulge. New superimposed small central disc protrusion (at site of posterior annular fissure). Uncovertebral hypertrophy on the right, new from the prior MRI. Facet arthrosis. Mild spinal canal narrowing, and moderate right neural foraminal narrowing, new from the prior MRI.   At C7-T1, there is 3 mm grade 1 anterolisthesis. Mild disc uncovering. Facet arthrosis. No significant spinal canal stenosis. Mild-to-moderate bilateral neural foraminal narrowing. Findings at this level have not significantly changed.  COGNITION: Overall cognitive status: Within functional limits for tasks assessed     SENSATION: Not tested  MUSCLE LENGTH: (not tested on 07/10/22) Mild restriction on B hamstrings Moderate restriction on B gastrocsoleus  POSTURE: rounded shoulders and forward head   LOWER EXTREMITY MMT  MMT Right eval Left eval Right 07/10/22 Left 07/10/22  Hip flexion 4 4 4 4   Hip extension 2+ (bridging) 2+  (bridging) 3+  (bridging) 3+ (bridging)  Hip abduction 4 4 4 4   Hip adduction      Hip internal rotation      Hip external rotation      Knee flexion 4- 4- 4 4  Knee extension 4- 3+ 4 4  Ankle dorsiflexion 3+ 4- 4- 4-  Ankle plantarflexion 3+ 3+ 3+  3+  Ankle inversion      Ankle eversion       (Blank rows = not tested)  LOWER EXTREMITY ROM  Active ROM Right eval Left eval  Hip flexion [facility]  Hip extension    Hip abduction [facility]  Hip adduction [facility]  Hip internal rotation    Hip external rotation    Knee flexion [facility] WFL  Knee extension [facility]  Ankle dorsiflexion [facility] WFL  Ankle plantarflexion [facility] WFL  Ankle inversion    Ankle eversion     (Blank rows = not tested)  FUNCTIONAL TESTS:  5 times sit to stand: 18.88 sec (little assist from rollator) from 26.97 sec (has to hold on to rollator) 2 minute walk test: 304 ft (with rollator) from 224 ft (with a rollator) Tinetti POMA : 14 from 9 (balance + gait)  GAIT: Distance walked: 304 ft Assistive device utilized:  rollator Level of assistance: Modified independence Comments: foot clearance on B more evident, normal step length on B  VESTIBULAR ASSESSMENT  06/21/22 07/10/22  Head impulse test positive with corrective saccades, dizziness reported positive with corrective saccades, dizziness reported  VOR 1 corrective saccades, dizziness reported corrective saccades, dizziness reported  Optokinetic reflex (R/L)  impaired, dizziness reported  impaired, dizziness reported    TODAY'S TREATMENT:                                                                                                                              DATE:  07/12/2022 Cervical neck AROM flex/ext, rot x 10 Gastrocnemius slant board stretch x 30" x 3 Sit-to-stand, seat slightly elevated (emphasis on scooting, heel placement back, and trunk momentum), CGA Standing, firm surface, normal BOS:  Horizontal saccades, while reading words x 1'  Vertical saccades, while reading words x 1'  Horizontal smooth pursuit, identifying animal silhouettes x 1'  Vertical smooth pursuit, identifying colors x 1'  VOR 1, own pace, identifying numbers x 1'  VOR cancellation, identifying shapes x 30"  Heel/toe raises with 1  UE support x 10 x 2  Tandem stance x 30" x 2, eyes open, light UE support Static standing, eyes open  x 30" x 2 Static standing with head turns x 30" x 2  Rhytmic stabilization x ant/post x 3" x 10 x 2  Mini squats 3" x 10 x 2, no UE support  TKE, RTB x 10 x 2 x 3"  Hip ext x 10 x 2, RTB  07/10/2022 Progress note (2MWT, Tinetti, vestibular exam, ROM, MMT, 5TSTS) Cervical neck AROM flex/ext, rot x 10 Standing, firm surface, normal BOS:  Horizontal saccades, while reading words x 1'  Vertical saccades, while identifying numbers x 1'  Horizontal smooth pursuit, identifying color/shapes x 1'  Vertical smooth pursuit, identifying animal silhouettes x 1'  VOR 1, own pace, identifying numbers x 1'  07/05/2022 Seated:  Cervical neck AROM flex/ext, rot x 10  Horizontal saccades, while reading words x 1'  Vertical saccades, while identifying numbers x 1'  Horizontal smooth pursuit, identifying color/shapes x 1'  Vertical smooth pursuit, identifying animal silhouettes x 1'  VOR 1, 70 bpm, identifying numbers x 1'  VOR cancellation, identifying colors x 1' Gastrocnemius slant board stretch x 30" x 3 Standing on firm surface, normal BOS:  Heel/toe raises with 1 UE support x 10 x 2  Tandem stance x 30" x 2, eyes open, light UE support Static standing, eyes open x 30" x 2 Static standing with head turns x 30" x 2  Rhytmic stabilization x ant/post x 3" x 10 x 2  Mini squats 3" x 10 x 2, no UE support  TKE, RTB x 10 x 2 x 3"  Hip ext x 10 x 2  07/02/22 Sitting: AROM of the cervical spine flexion/extension, rotation x 10 Gastroc stretch with strap 5 x 20" each  Standing: Heel/toe raises 2 x 10 Hip abduction and extension 2 x 10 each Tandem stance 2 x 30" fingertip support Small base of support standing 3 x 30" no UE support Shoulder width stance with light support with head turns and nods x 10 each  06/28/2022 Seated:  Cervical neck AROM flex/ext, rot x 10  Gastrocnemius strap  stretch x 30" x 3  Horizontal saccades, while identifying numbers x 1'  Vertical saccades, while reading words x 1'  Horizontal smooth pursuit, identifying animal silhouettes x 1'  Vertical smooth pursuit, identifying colors/shapes x 1'  VOR 1, 70 bpm, identifying numbers x 1'  VOR cancellation, reading words x 1' Gastrocnemius slant board stretch x 30" x 3 Standing on firm surface, normal BOS:  Heel/toe raises with 1 UE support x 10 x 2  Tandem stance x 30" x 2, eyes open, light UE support Static standing, eyes open x 30"  Static standing with head turns x 30"  Rhytmic stabilization x ant/post x 3" x 10 x 2  Mini squats 3" x 10 x 2, no UE support  TKE, RTB x 10 x 2 x 3"  Hip ext x 10 x 2  06/27/2022 Seated:  Cervical neck AROM flex/ext, rot x 10  Gastrocnemius strap stretch x 30" x 3  Horizontal saccades, while identifying numbers x 1'  Vertical saccades, while reading words x 1'  Horizontal smooth pursuit, identifying animal silhouettes x 1'  Vertical smooth pursuit, identifying colors/shapes x 1'  VOR 1, 70 bpm, identifying numbers x 1'  VOR cancellation, reading words x 1' Standing on firm surface, normal BOS:  Heel/toe raises with 1 UE support x 10 x 2  Tandem stance x 30" x 3, eyes open, light UE support Static standing, eyes open x 30"  Static standing  with head turns x 30"  Rhytmic stabilization x ant/post x 3" x 10  06/21/2022 Seated:  Cervical neck AROM flex/ext, rot x 10  Horizontal saccades, while identifying numbers x 1'  Vertical saccades, while reading words x 1'  Horizontal smooth pursuit, identifying animal silhouettes x 1'  Vertical smooth pursuit, identifying colors/shapes x 1'  VOR 1, 60 bpm, identifying numbers x 1'  VOR cancellation, identifying colors x 1'  06/18/2022 -Education for pt's explanation of visual disturbances during walking; Attempted VOR assessment but limited Head ROM due to previous surgieies.  -Habituation exercises daily at home  VOR x 1 3x daily. - 1 x 5  elevated dead lift with weighed balls with 10in box  - 2x 5Weighted sit/stands from elevated mat with yellow weighted ball.  2  06/15/22 Evaluation and patient education done    PATIENT EDUCATION:  Education details: Updated HEP. Instructed patient to perform standing exercises on with a stable support (e.g. kitchen counter) and have her husband next to her. Person educated: Patient Education method: Explanation Education comprehension: verbalized understanding  HOME EXERCISE PROGRAM: 07/02/22 hip abduction, small bos standing and regular standing head turns and nods  Access Code: [email or web address] URL: [email or web address]  06/28/2022 - Mini Squat with Counter Support  - 1-2 x daily - 5-7 x weekly - 2 sets - 10 reps - 3 hold - Standing Hip Extension with Counter Support  - 1-2 x daily - 5-7 x weekly - 2 sets - 10 reps  Date: 06/27/2022 Prepared by: Rexene Alberts  Exercises - Seated Calf Stretch with Strap  - 1-2 x daily - 5-7 x weekly - 3 reps - 30 hold - Standing Tandem Balance with Counter Support  - 1-2 x daily - 5-7 x weekly - 30 hold - Heel Toe Raises with Counter Support  - 1-2 x daily - 5-7 x weekly - 2 sets - 10 reps  06/21/2022 GAZE STABILIZATION EXERCISES (2-3x/day, 5-7 days/week, seated) These exercises are designed to improve your eyes' ability to track objects without getting dizzy. When performing these exercises, make sure you are facing a blank wall (like a white wall) to avoid distraction. You can wear your glasses/contact lenses if you must.   Before anything, warm-up your neck muscles by bending it forward and back 10 times, side-to-side 10 times, and slowly turn your head left and right 10 time Exercise 1 (Horizontal Saccades) Use two cards for this exercise While holding the cards on each hand side by side in sitting with arms outstretched, look at the first item on the other card then look at the next item on the second card  then alternate your gaze sequentially. Do NOT move your cards nor the your head. Do the following for one minute each Identifying numbers Identifying shapes Exercise 2 (Paradigm x1) Use only one card for this exercise While holding the card in sitting with your arm outstretched, turn your head left and right at your own pace while keeping the card stationary.  Do this for one minute each while you are: Identifying numbers Identifying shapes Exercise 3 (VOR cancellation) Use only one card for this exercise While holding the card in sitting with your arms outstretched, turn your body, arm, and head at the same time left and right at your own pace Do this for one minute each while you are: Identifying numbers Identifying shapes  3/11/20204 - VOR x 1 Habituation Exercises 3x daily - Sit/stands w/ 4.5lb weight at home 2 x 5 daily.  ASSESSMENT:  CLINICAL IMPRESSION: Interventions today were geared towards gaze stabilization and standing balance as well as functional strengthening such as sit-to-stand. Patient demonstrated severe unsteadiness on the sit-to-stand task due to impaired proprioception Patient tolerated gaze stabilization activities with mild dizziness but now with less unsteadiness on the saccades, VOR 1 and cancellation exercises. Less unsteadiness and no sudden body jerks noted when doing standing balance exercises. However, patient still demonstrated mild to moderated unsteadiness with the tandem stance. Demonstrated appropriate levels of fatigue. Rest periods provided. Pacing of activities was slightly faster today. Provided slight amount of cueing to ensure correct execution of activity with good carry-over.   PROGRESS NOTE 07/12/22: Patient demonstrated continued improvements in function as indicated by positive significant changes in 2MWT, 5TSTS, and strength. However, patient still presents with deficits in vestibular function which greatly affects her standing balance. Patient  still presents as a falls risk based on the Tinetti score. With this, skilled PT is still required to address the impairments and functional limitations listed below.  Interventions today were geared towards gaze stabilization incorporated with her standing balance. Patient tolerated gaze stabilization activities with mild dizziness and unsteadiness on the saccades, VOR 1 and cancellation exercises. Demonstrated appropriate levels of fatigue. Rest periods provided. Provided slight amount of cueing to ensure correct execution of activity with good carry-over.    OBJECTIVE IMPAIRMENTS: Abnormal gait, decreased balance, decreased coordination, decreased endurance, difficulty walking, decreased strength, impaired flexibility, and pain.   ACTIVITY LIMITATIONS: carrying, lifting, bending, standing, squatting, stairs, transfers, continence, bathing, toileting, dressing, self feeding, hygiene/grooming, and locomotion level  PARTICIPATION LIMITATIONS: meal prep, cleaning, laundry, driving, shopping, community activity, and yard work  PERSONAL FACTORS: Fitness level, Bladder implant (will have surgery 07/26/2022), hx of B THR, lobectomy are also affecting patient's functional outcome.   REHAB POTENTIAL: Fair    CLINICAL DECISION MAKING: Evolving/moderate complexity  EVALUATION COMPLEXITY: Moderate   GOALS: Goals reviewed with patient? Yes  SHORT TERM GOALS: Target date: 07/13/2022  Pt will demonstrate indep in HEP to facilitate carry-over of skilled services and improve functional outcomes Goal status: MET  LONG TERM GOALS: Target date: 08/10/2022  Pt will be report little to no dizziness on VOR 1 and head impulse test to facilitate safety on ambulation Baseline: with dizziness Goal status: INITIAL/NEW   Pt will decrease 5TSTS by at least 5 seconds in order to demonstrate clinically significant improvement in LE strength  Baseline: 18.88 sec Goal status: REVISED  3.  Patient will  demonstrate increase in Tinetti Score by 8 points in order to demonstrate clinically significant improvement in balance and decreased risk for falls  Baseline: 9 Goal status: IN PROGRESS  4.  Pt will increase 2MWT by at least 40 ft in order to demonstrate clinically significant improvement in community ambulation Baseline: 304 ft Goal status: REVISED  5.  Pt will demonstrate increase in LE strength to 4 to facilitate ease and safety in ambulation Baseline: 2+ Goal status: IN PROGRESS  6.  Pt will demonstrate improved LE coordination as manifested by performing 3/4 correct trials in heel-to-shin to facilitate ease in ambulation Baseline: impaired Goal status: INITIAL  7.  Pt will be able to walk with a quad cane for > 50 ft Baseline: walks with a rollator Goal status: REVISED   PLAN:  PT FREQUENCY: 3x/week  PT DURATION: 4 weeks  PLANNED INTERVENTIONS: Therapeutic exercises, Therapeutic activity, Neuromuscular re-education, Balance training, Gait training, Patient/Family education, Self Care, and Stair training  PLAN FOR NEXT SESSION: Continue POC  and may progress as tolerated with emphasis on balance, LE strengthening, flexibility, and vestibular function.   12:58 PM, 07/12/22 Harvie Heck. Gennifer Potenza, PT, DPT, OCS Board-Certified Clinical Specialist in [place] # (): [id number]

## 2022-07-13 ENCOUNTER — Ambulatory Visit (HOSPITAL_COMMUNITY): Payer: Medicare Other

## 2022-07-13 DIAGNOSIS — R262 Difficulty in walking, not elsewhere classified: Secondary | ICD-10-CM | POA: Diagnosis not present

## 2022-07-13 NOTE — Therapy (Signed)
OUTPATIENT PHYSICAL THERAPY LOWER EXTREMITY TREATMENT NOTE   Patient Name: [name] MRN: [id number] DOB:1966/03/28, 57 y.o., female Today's Date: 07/13/2022  END OF SESSION:  PT End of Session - 07/13/22 1649     Visit Number 10    Number of Visits 20    Date for PT Re-Evaluation 08/10/22    Authorization Type BCBS Medicare, Medicaid of [place]    Progress Note Due on Visit 20    PT Start Time 1515    PT Stop Time 1600    PT Time Calculation (min) 45 min    Equipment Utilized During Treatment Gait belt;Oxygen    Activity Tolerance Patient tolerated treatment well    Behavior During Therapy WFL for tasks assessed/performed            Past Medical History:  Diagnosis Date   Anemia    Anxiety    Arthritis    Asthma    Cervical radiculopathy    Cluster headaches    COPD (chronic obstructive pulmonary disease)    COPD (chronic obstructive pulmonary disease) with chronic bronchitis    GERD (gastroesophageal reflux disease)    Hyperlipemia    Hypoglycemia    Hypothyroidism    Migraine    Pneumothorax    PONV (postoperative nausea and vomiting)    Pre-diabetes    Rectal discharge 07/28/2010   Shortness of breath    Sleep apnea    cannot tolerate-not using CPAP   Sluggishness 07/28/2010   Past Surgical History:  Procedure Laterality Date   ABDOMINAL HYSTERECTOMY     with bladder suspension   CERVICAL FUSION     CHOLECYSTECTOMY     COLONOSCOPY N/A 07/30/2013   Procedure: COLONOSCOPY;  Surgeon: Malissa Hippo, MD;  Location: [facility];  Service: Endoscopy;  Laterality: N/A;  930   EXAM UNDER ANESTHESIA WITH MANIPULATION OF SHOULDER Right 10/11/2016   Procedure: EXAM UNDER ANESTHESIA WITH MANIPULATION OF SHOULDER;  Surgeon: Vickki Hearing, MD;  Location: [facility];  Service: Orthopedics;  Laterality: Right;   HERNIA REPAIR     INCISIONAL HERNIA REPAIR N/A 07/30/2016   Procedure: HERNIA REPAIR INCISIONAL WITH MESH;  Surgeon: [name], MD;  Location:  [facility];  Service: General;  Laterality: N/A;   INTERSTIM IMPLANT PLACEMENT  02/02/2021   Procedure: Leane Platt IMPLANT FIRST STAGE LEFT SACRUM ;  Surgeon: Malen Gauze, MD;  Location: [facility];  Service: Urology;;   Leane Platt IMPLANT PLACEMENT N/A 02/23/2021   Procedure: Leane Platt IMPLANT SECOND STAGE;  Surgeon: Malen Gauze, MD;  Location: [facility];  Service: Urology;  Laterality: N/A;  Rep coming, time needs to stay at 2:00   JOINT REPLACEMENT     LOBECTOMY     right lung    POSTERIOR CERVICAL LAMINECTOMY Right 04/05/2017   Procedure: Right C6-7 Posterior cervical laminectomy;  Surgeon: Donalee Citrin, MD;  Location: [facility] OR;  Service: Neurosurgery;  Laterality: Right;  Right C6-7 Posterior cervical laminectomy   SHOULDER ARTHROSCOPY WITH ROTATOR CUFF REPAIR Right 10/11/2016   Procedure: SHOULDER ARTHROSCOPY;  Surgeon: Vickki Hearing, MD;  Location: [facility];  Service: Orthopedics;  Laterality: Right;   TOTAL HIP ARTHROPLASTY Right 04/15/2020   Procedure: RIGHT TOTAL HIP ARTHROPLASTY ANTERIOR APPROACH;  Surgeon: Kathryne Hitch, MD;  Location: [facility];  Service: Orthopedics;  Laterality: Right;   TOTAL HIP ARTHROPLASTY Left 07/22/2020   Procedure: LEFT  HIP ARTHROPLASTY ANTERIOR APPROACH;  Surgeon: Kathryne Hitch, MD;  Location: [facility];  Service: Orthopedics;  Laterality: Left;  RNFA   TUBAL LIGATION     Patient Active Problem List   Diagnosis Date Noted   Severe persistent asthma without complication 06/06/2022   Gait abnormality 05/28/2022   Adrenal insufficiency 03/30/2022   Endocrine disorder, unspecified 07/04/2021   Prediabetes 07/04/2021   Hypothyroidism 07/04/2021   Former smoker 06/29/2021   Seasonal and perennial allergic rhinitis 06/19/2021   Oxygen dependent 06/19/2021   Steroid dependent 06/19/2021   Immunosuppressed status 06/19/2021   Radiculopathy, lumbar region 12/08/2020   Status post left hip replacement 07/22/2020   Status post right hip  replacement 05/02/2020   Status post total replacement of right hip 04/15/2020   Avascular necrosis of bone of left hip 02/10/2020   Avascular necrosis of bone of right hip 02/10/2020   Allergic rhinitis 05/21/2019   Asthma-COPD overlap syndrome 04/30/2019   Chronic respiratory failure with hypoxia 04/30/2019   Spinal stenosis of cervical region 04/05/2017   Partial tear of right subscapularis tendon    Incisional hernia, without obstruction or gangrene    Urinary frequency 03/21/2012   Migraine equivalent syndrome 03/21/2012   Insomnia 03/21/2012    PCP: Benita Stabile, MD  REFERRING PROVIDER: Levert Feinstein, MD  REFERRING DIAG: R26.9 (ICD-10-CM) - Gait abnormality  THERAPY DIAG:  Difficulty in walking, not elsewhere classified  Rationale for Evaluation and Treatment: Rehabilitation  ONSET DATE: April 12, 2022  SUBJECTIVE:   SUBJECTIVE STATEMENT: Patient is doing well today. Denies falls. However, patient woke up dizzy this morning. At the moment, patient's dizziness is rated 5/10.  PROGRESS NOTE 07/10/22: Patient states that she went to her neurologist and was diagnosed with vestibular neuritis. Patient says she will be referred to a neurologist. Patient states that she's still dizzy ("wavy" sensatiob) but not as bad as she used to be. Patient states that her balance is still off but has improved a little since she started PT. Denies any recent falls. Patient thinks she has improved around 25%. With this, patient wishes to continue with PT.   PERTINENT HISTORY: Bladder implant (will have surgery 07/26/2022), hx of B THR, lobectomy PAIN:  Are you having pain? No  PRECAUTIONS: Fall and Other: uses portable O2  WEIGHT BEARING RESTRICTIONS: No  FALLS:  Has patient fallen in last 6 months? Yes. Number of falls 7  LIVING ENVIRONMENT: Lives with: lives with their spouse Lives in: House/apartment Stairs: Yes: External: 1 steps; none Has following equipment at home: Single point  cane, Walker - 2 wheeled, Environmental consultant - 4 wheeled, shower chair, bed side commode, and oxygen  OCCUPATION: on disability  PLOF: Independent with household mobility with device and Requires assistive device for independence  PATIENT GOALS: "to be able to walk straight by myself"  NEXT MD VISIT: Aug 30, 2022  OBJECTIVE:   DIAGNOSTIC FINDINGS:  MRI HEAD WITHOUT CONTRAST 06/08/22   TECHNIQUE: Multiplanar, multiecho pulse sequences of the brain and surrounding structures were obtained without intravenous contrast.   COMPARISON:  Head CT 05/22/2022. Brain MRI 05/22/2011.   FINDINGS: Brain:   No age advanced or lobar predominant parenchymal atrophy.   Multifocal T2 FLAIR hyperintense signal abnormality within the cerebral white matter, overall mild but greater than expected for age.   There is no acute infarct.   No evidence of an intracranial mass.   No chronic intracranial blood products.   No extra-axial fluid collection.   No midline shift.   Vascular: Maintained flow voids within the proximal large arterial vessels.   Skull and upper  cervical spine: No focal suspicious calvarial marrow lesion. Cervical spine findings separately reported on same day cervical spine MRI.   Sinuses/Orbits: No mass or acute finding within the imaged orbits. Prior bilateral ocular lens replacement. Trace mucosal thickening within left ethmoid air cells.   Other: Small-volume fluid within the bilateral mastoid air cells.   IMPRESSION: 1.  No evidence of an acute intracranial abnormality. 2. Multifocal T2 FLAIR hyperintense signal abnormality within the cerebral white matter, overall mild but greater than expected for age. These signal changes have slightly progressed from the prior brain MRI of 05/22/2011. Findings are nonspecific and differential considerations include chronic small vessel ischemic disease, sequelae of chronic migraine headaches, sequelae of a  prior infectious/inflammatory process or sequelae of a demyelinating process (such as multiple sclerosis), among others. No lesions specifically suggest demyelinating disease. 3. Small-volume fluid within the bilateral mastoid air cells.  MRI CERVICAL SPINE WITHOUT CONTRAST 06/08/22   TECHNIQUE: Multiplanar, multisequence MR imaging of the cervical spine was performed. No intravenous contrast was administered.   COMPARISON:  Radiographs of the cervical spine 08/06/2017. Cervical spine MRI 02/24/2017.   FINDINGS: Alignment: Straightening of the expected cervical lordosis. 3 mm C7-T1 grade 1 anterolisthesis.   Vertebrae: Vertebral body height is maintained. Susceptibility artifact arising from ACDF hardware at the C4-C7 levels. No appreciable significant marrow edema or focal suspicious osseous lesion.   Cord: No signal abnormality identified within the cervical spinal cord.   Posterior Fossa, vertebral arteries, paraspinal tissues: Posterior fossa assessed on same-day brain MRI. Flow voids preserved within the imaged cervical vertebral arteries. No paraspinal mass or collection.   Disc levels:   Unless otherwise stated, the level by level findings below have not significantly changed from the prior MRI of 03/06/2017.   Disc degeneration at the non-operative levels, greatest at C3-C4 and C7-T1 (progressed at these levels).   C2-C3: Slight disc bulge. No significant spinal canal or foraminal stenosis.   C3-C4: Disc bulge. New superimposed small central disc protrusion (at site of posterior annular fissure). Uncovertebral hypertrophy on the right, new from the prior MRI. Facet arthrosis. Mild relative spinal canal narrowing (without spinal cord mass effect), new from the prior MRI. Moderate right neural foraminal narrowing, also new from the prior MRI.   C4-C5: Prior ACDF. No significant spinal canal or foraminal stenosis.   C5-C6: Prior ACDF. Uncovertebral hypertrophy  on the right. No significant spinal canal stenosis. Mild right neural foraminal narrowing.   C6-C7: Prior ACDF. Uncovertebral vertebrae on the right. No significant spinal canal stenosis. Moderate right neural foraminal narrowing.   C7-T1: 3 mm grade 1 anterolisthesis. Mild disc uncovering. Facet arthrosis. No significant spinal canal stenosis. Mild-to-moderate bilateral neural foraminal narrowing.   IMPRESSION: Prior C4-C7 ACDF. No significant spinal canal stenosis at these levels. As before, uncovertebral hypertrophy results in neural foraminal narrowing on the right at C5-C6 (mild), and on the right at C6-C7 (moderate).   Cervical spondylosis at the remaining levels, as outlined and with findings most notably as follows.   At C3-C4, there is a disc bulge. New superimposed small central disc protrusion (at site of posterior annular fissure). Uncovertebral hypertrophy on the right, new from the prior MRI. Facet arthrosis. Mild spinal canal narrowing, and moderate right neural foraminal narrowing, new from the prior MRI.   At C7-T1, there is 3 mm grade 1 anterolisthesis. Mild disc uncovering. Facet arthrosis. No significant spinal canal stenosis. Mild-to-moderate bilateral neural foraminal narrowing. Findings at this level have not significantly changed.  COGNITION: Overall cognitive  status: Within functional limits for tasks assessed     SENSATION: Not tested  MUSCLE LENGTH: (not tested on 07/10/22) Mild restriction on B hamstrings Moderate restriction on B gastrocsoleus  POSTURE: rounded shoulders and forward head   LOWER EXTREMITY MMT  MMT Right eval Left eval Right 07/10/22 Left 07/10/22  Hip flexion 4 4 4 4   Hip extension 2+ (bridging) 2+  (bridging) 3+  (bridging) 3+ (bridging)  Hip abduction 4 4 4 4   Hip adduction      Hip internal rotation      Hip external rotation      Knee flexion 4- 4- 4 4  Knee extension 4- 3+ 4 4  Ankle dorsiflexion 3+ 4- 4- 4-   Ankle plantarflexion 3+ 3+ 3+ 3+  Ankle inversion      Ankle eversion       (Blank rows = not tested)  LOWER EXTREMITY ROM  Active ROM Right eval Left eval  Hip flexion [facility]  Hip extension    [facility]ip ab[facility]  Hip adduction [facility]  Hip internal rotation    Hip external rotation    Knee flexion [facility] W[facility]  Knee extension [facility]  Ankle dorsiflexion [facility]  Ankle plantarflexion [facility]  Ankle inversion    Ankle eversion     (Blank rows = not tested)  FUNCTIONAL TESTS:  5 times sit to stand: 18.88 sec (little assist from rollator) from 26.97 sec (has to hold on to rollator) 2 minute walk test: 304 ft (with rollator) from 224 ft (with a rollator) Tinetti POMA : 14 from 9 (balance + gait)  GAIT: Distance walked: 304 ft Assistive device utilized:  rollator Level of assistance: Modified independence Comments: foot clearance on B more evident, normal step length on B  VESTIBULAR ASSESSMENT  06/21/22 07/10/22  Head impulse test positive with corrective saccades, dizziness reported positive with corrective saccades, dizziness reported  VOR 1 corrective saccades, dizziness reported corrective saccades, dizziness reported  Optokinetic reflex (R/L)  impaired, dizziness reported  impaired, dizziness reported    TODAY'S TREATMENT:                                                                                                                              DATE:  07/13/2022 Cervical neck AROM flex/ext, rot x 10 Gastrocnemius slant board stretch x 30" x 3 Sit-to-stand, seat slightly elevated (emphasis on scooting, heel placement back, and trunk momentum), CGA x 10 Standing, firm surface, normal BOS:  Horizontal saccades, while reading words x 1'  Vertical saccades, while reading numbers x 1'  Horizontal smooth pursuit, identifying animal silhouettes x 1'  Vertical smooth pursuit, identifying/counting colors/shapes x 1'  VOR 1, own pace, identifying colors x 1'  VOR  cancellation, identifying shapes x 30"  Heel/toe raises, no UE support x 10 x 2  Tandem stance x 30" x 2, eyes open, light UE support Static standing, eyes open x 30"  x 2, no UE support  Rhytmic stabilization x ant/post x 3" x 10 x 2, no UE support  Rocking front/back, L arm swing, R LE forward x 30", R UE support  Mini squats 3" x 10 x 2, no UE support  TKE, GTB x 10 x 2 x 3"  Hip vectors x 10 x 2, RTB  07/12/2022 Cervical neck AROM flex/ext, rot x 10 Gastrocnemius slant board stretch x 30" x 3 Sit-to-stand, seat slightly elevated (emphasis on scooting, heel placement back, and trunk momentum), CGA Standing, firm surface, normal BOS:  Horizontal saccades, while reading words x 1'  Vertical saccades, while reading words x 1'  Horizontal smooth pursuit, identifying animal silhouettes x 1'  Vertical smooth pursuit, identifying colors x 1'  VOR 1, own pace, identifying numbers x 1'  VOR cancellation, identifying shapes x 30"  Heel/toe raises with 1 UE support x 10 x 2  Tandem stance x 30" x 2, eyes open, light UE support Static standing, eyes open x 30" x 2 Static standing with head turns x 30" x 2  Rhytmic stabilization x ant/post x 3" x 10 x 2  Mini squats 3" x 10 x 2, no UE support  TKE, RTB x 10 x 2 x 3"  Hip ext x 10 x 2, RTB  07/10/2022 Progress note ( , Tinetti, vestibular exam, ROM, MMT, 5TSTS) Cervical neck AROM flex/ext, rot x 10 Standing, firm surface, normal BOS:  Horizontal saccades, while reading words x 1'  Vertical saccades, while identifying numbers x 1'  Horizontal smooth pursuit, identifying color/shapes x 1'  Vertical smooth pursuit, identifying animal silhouettes x 1'  VOR 1, own pace, identifying numbers x 1'  07/05/2022 Seated:  Cervical neck AROM flex/ext, rot x 10  Horizontal saccades, while reading words x 1'  Vertical saccades, while identifying numbers x 1'  Horizontal smooth pursuit, identifying color/shapes x 1'  Vertical smooth pursuit,  identifying animal silhouettes x 1'  VOR 1, 70 bpm, identifying numbers x 1'  VOR cancellation, identifying colors x 1' Gastrocnemius slant board stretch x 30" x 3 Standing on firm surface, normal BOS:  Heel/toe raises with 1 UE support x 10 x 2  Tandem stance x 30" x 2, eyes open, light UE support Static standing, eyes open x 30" x 2 Static standing with head turns x 30" x 2  Rhytmic stabilization x ant/post x 3" x 10 x 2  Mini squats 3" x 10 x 2, no UE support  TKE, RTB x 10 x 2 x 3"  Hip ext x 10 x 2  07/02/22 Sitting: AROM of the cervical spine flexion/extension, rotation x 10 Gastroc stretch with strap 5 x 20" each  Standing: Heel/toe raises 2 x 10 Hip abduction and extension 2 x 10 each Tandem stance 2 x 30" fingertip support Small base of support standing 3 x 30" no UE support Shoulder width stance with light support with head turns and nods x 10 each  06/28/2022 Seated:  Cervical neck AROM flex/ext, rot x 10  Gastrocnemius strap stretch x 30" x 3  Horizontal saccades, while identifying numbers x 1'  Vertical saccades, while reading words x 1'  Horizontal smooth pursuit, identifying animal silhouettes x 1'  Vertical smooth pursuit, identifying colors/shapes x 1'  VOR 1, 70 bpm, identifying numbers x 1'  VOR cancellation, reading words x 1' Gastrocnemius slant board stretch x 30" x 3 Standing on firm surface, normal BOS:  Heel/toe raises with 1 UE support x  10 x 2  Tandem stance x 30" x 2, eyes open, light UE support Static standing, eyes open x 30"  Static standing with head turns x 30"  Rhytmic stabilization x ant/post x 3" x 10 x 2  Mini squats 3" x 10 x 2, no UE support  TKE, RTB x 10 x 2 x 3"  Hip ext x 10 x 2  06/27/2022 Seated:  Cervical neck AROM flex/ext, rot x 10  Gastrocnemius strap stretch x 30" x 3  Horizontal saccades, while identifying numbers x 1'  Vertical saccades, while reading words x 1'  Horizontal smooth pursuit, identifying animal  silhouettes x 1'  Vertical smooth pursuit, identifying colors/shapes x 1'  VOR 1, 70 bpm, identifying numbers x 1'  VOR cancellation, reading words x 1' Standing on firm surface, normal BOS:  Heel/toe raises with 1 UE support x 10 x 2  Tandem stance x 30" x 3, eyes open, light UE support Static standing, eyes open x 30"  Static standing with head turns x 30"  Rhytmic stabilization x ant/post x 3" x 10  06/21/2022 Seated:  Cervical neck AROM flex/ext, rot x 10  Horizontal saccades, while identifying numbers x 1'  Vertical saccades, while reading words x 1'  Horizontal smooth pursuit, identifying animal silhouettes x 1'  Vertical smooth pursuit, identifying colors/shapes x 1'  VOR 1, 60 bpm, identifying numbers x 1'  VOR cancellation, identifying colors x 1'  06/18/2022 -Education for pt's explanation of visual disturbances during walking; Attempted VOR assessment but limited Head ROM due to previous surgieies.  -Habituation exercises daily at home VOR x 1 3x daily. - 1 x 5  elevated dead lift with weighed balls with 10in box  - 2x 5Weighted sit/stands from elevated mat with yellow weighted ball.  2  06/15/22 Evaluation and patient education done    PATIENT EDUCATION:  Education details: Updated HEP. Instructed patient to perform standing exercises on with a stable support (e.g. kitchen counter) and have her husband next to her. Person educated: Patient Education method: Explanation Education comprehension: verbalized understanding  HOME EXERCISE PROGRAM: Access Code: [email or web address] URL: [email or web address]  07/13/22: - Standing 3-Way Leg Reach with Resistance at Ankles and Counter Support  - 1 x daily - 7 x weekly - 2 sets - 10 reps  07/02/22 hip abduction, small bos standing and regular standing head turns and nods  06/28/2022 - Mini Squat with Counter Support  - 1-2 x daily - 5-7 x weekly - 2 sets - 10 reps - 3 hold - Standing Hip Extension with Counter Support   - 1-2 x daily - 5-7 x weekly - 2 sets - 10 reps  Date: 06/27/2022 Prepared by: Krystal Clark  Exercises - Seated Calf Stretch with Strap  - 1-2 x daily - 5-7 x weekly - 3 reps - 30 hold - Standing Tandem Balance with Counter Support  - 1-2 x daily - 5-7 x weekly - 30 hold - Heel Toe Raises with Counter Support  - 1-2 x daily - 5-7 x weekly - 2 sets - 10 reps  06/21/2022 GAZE STABILIZATION EXERCISES (2-3x/day, 5-7 days/week, seated) These exercises are designed to improve your eyes' ability to track objects without getting dizzy. When performing these exercises, make sure you are facing a blank wall (like a white wall) to avoid distraction. You can wear your glasses/contact lenses if you must.   Before anything, warm-up your neck muscles by bending it forward and back 10 times, side-to-side 10 times,  and slowly turn your head left and right 10 time Exercise 1 (Horizontal Saccades) Use two cards for this exercise While holding the cards on each hand side by side in sitting with arms outstretched, look at the first item on the other card then look at the next item on the second card then alternate your gaze sequentially. Do NOT move your cards nor the your head. Do the following for one minute each Identifying numbers Identifying shapes Exercise 2 (Paradigm x1) Use only one card for this exercise While holding the card in sitting with your arm outstretched, turn your head left and right at your own pace while keeping the card stationary.  Do this for one minute each while you are: Identifying numbers Identifying shapes Exercise 3 (VOR cancellation) Use only one card for this exercise While holding the card in sitting with your arms outstretched, turn your body, arm, and head at the same time left and right at your own pace Do this for one minute each while you are: Identifying numbers Identifying shapes  3/11/20204 - VOR x 1 Habituation Exercises 3x daily - Sit/stands w/ 4.5lb  weight at home 2 x 5 daily.  ASSESSMENT:  CLINICAL IMPRESSION: Interventions today were geared towards gaze stabilization and standing balance as well as functional strengthening such as sit-to-stand. Patient still has severe unsteadiness on the sit-to-stand task due to impaired proprioception. Patient tolerated gaze stabilization activities with mild dizziness but was a little more unsteady than yesterday on the saccades, VOR 1 and cancellation exercises. Little unsteadiness and no sudden body jerks noted when doing standing balance exercises. Patient still demonstrated mild to moderate unsteadiness with the tandem stance. Demonstrated appropriate levels of fatigue. Rest periods provided. Pacing of activities was slightly slower today. Provided slight amount of cueing to ensure correct execution of activity with good carry-over.   PROGRESS NOTE 07/12/22: Patient demonstrated continued improvements in function as indicated by positive significant changes in 2MWT, 5TSTS, and strength. However, patient still presents with deficits in vestibular function which greatly affects her standing balance. Patient still presents as a falls risk based on the Tinetti score. With this, skilled PT is still required to address the impairments and functional limitations listed below.  Interventions today were geared towards gaze stabilization incorporated with her standing balance. Patient tolerated gaze stabilization activities with mild dizziness and unsteadiness on the saccades, VOR 1 and cancellation exercises. Demonstrated appropriate levels of fatigue. Rest periods provided. Provided slight amount of cueing to ensure correct execution of activity with good carry-over.    OBJECTIVE IMPAIRMENTS: Abnormal gait, decreased balance, decreased coordination, decreased endurance, difficulty walking, decreased strength, impaired flexibility, and pain.   ACTIVITY LIMITATIONS: carrying, lifting, bending, standing, squatting,  stairs, transfers, continence, bathing, toileting, dressing, self feeding, hygiene/grooming, and locomotion level  PARTICIPATION LIMITATIONS: meal prep, cleaning, laundry, driving, shopping, community activity, and yard work  PERSONAL FACTORS: Fitness level, Bladder implant (will have surgery 07/26/2022), hx of B THR, lobectomy are also affecting patient's functional outcome.   REHAB POTENTIAL: Fair    CLINICAL DECISION MAKING: Evolving/moderate complexity  EVALUATION COMPLEXITY: Moderate   GOALS: Goals reviewed with patient? Yes  SHORT TERM GOALS: Target date: 07/13/2022  Pt will demonstrate indep in HEP to facilitate carry-over of skilled services and improve functional outcomes Goal status: MET  LONG TERM GOALS: Target date: 08/10/2022  Pt will be report little to no dizziness on VOR 1 and head impulse test to facilitate safety on ambulation Baseline: with dizziness Goal status: INITIAL/NEW  Pt will decrease 5TSTS by at least 5 seconds in order to demonstrate clinically significant improvement in LE strength  Baseline: 18.88 sec Goal status: REVISED  3.  Patient will demonstrate increase in Tinetti Score by 8 points in order to demonstrate clinically significant improvement in balance and decreased risk for falls  Baseline: 9 Goal status: IN PROGRESS  4.  Pt will increase by at least 40 ft in order to demonstrate clinically significant improvement in community ambulation Baseline: 304 ft Goal status: REVISED  5.  Pt will demonstrate increase in LE strength to 4 to facilitate ease and safety in ambulation Baseline: 2+ Goal status: IN PROGRESS  6.  Pt will demonstrate improved LE coordination as manifested by performing 3/4 correct trials in heel-to-shin to facilitate ease in ambulation Baseline: impaired Goal status: INITIAL  7.  Pt will be able to walk with a quad cane for > 50 ft Baseline: walks with a rollator Goal status: REVISED   PLAN:  PT  FREQUENCY: 3x/week  PT DURATION: 4 weeks  PLANNED INTERVENTIONS: Therapeutic exercises, Therapeutic activity, Neuromuscular re-education, Balance training, Gait training, Patient/Family education, Self Care, and Stair training  PLAN FOR NEXT SESSION: Continue POC and may progress as tolerated with emphasis on balance, LE strengthening, flexibility, and vestibular function.   4:51 PM, 07/13/22 Tish Frederickson. Oreoluwa Gilmer, PT, DPT, OCS Board-Certified Clinical Specialist in Orthopedic PT PT Compact Privilege # ([place]): [phone] T

## 2022-07-16 ENCOUNTER — Telehealth: Payer: Self-pay | Admitting: Neurology

## 2022-07-16 NOTE — Telephone Encounter (Signed)
Sent via epic, Suszanne Conners looks like a [facility] provider which they should be able to see the referral.

## 2022-07-16 NOTE — Telephone Encounter (Signed)
[phone], Dr.Teou

## 2022-07-16 NOTE — Telephone Encounter (Signed)
Pt called in stating she hadn't heard from Dr. Avel Sensor office so she called them and they do not have a referral. She is wondering if it could be sent again.

## 2022-07-17 ENCOUNTER — Encounter (HOSPITAL_COMMUNITY): Payer: Self-pay | Admitting: Physical Therapy

## 2022-07-17 ENCOUNTER — Ambulatory Visit (HOSPITAL_COMMUNITY): Payer: Medicare Other | Admitting: Physical Therapy

## 2022-07-17 DIAGNOSIS — R262 Difficulty in walking, not elsewhere classified: Secondary | ICD-10-CM | POA: Diagnosis not present

## 2022-07-17 DIAGNOSIS — M25551 Pain in right hip: Secondary | ICD-10-CM

## 2022-07-17 NOTE — Therapy (Signed)
OUTPATIENT PHYSICAL THERAPY LOWER EXTREMITY TREATMENT NOTE   Patient Name: [name] MRN: [id number] DOB:02-17-1966, 57 y.o., female Today's Date: 07/17/2022  END OF SESSION:  PT End of Session - 07/17/22 1350     Visit Number 11    Number of Visits 20    Date for PT Re-Evaluation 08/10/22    Authorization Type BCBS Medicare, Medicaid of [place]    Progress Note Due on Visit 20    PT Start Time 1350    PT Stop Time 1440    PT Time Calculation (min) 50 min    Equipment Utilized During Treatment Gait belt;Oxygen    Activity Tolerance Patient tolerated treatment well    Behavior During Therapy WFL for tasks assessed/performed            Past Medical History:  Diagnosis Date   Anemia    Anxiety    Arthritis    Asthma    Cervical radiculopathy    Cluster headaches    COPD (chronic obstructive pulmonary disease)    COPD (chronic obstructive pulmonary disease) with chronic bronchitis    GERD (gastroesophageal reflux disease)    Hyperlipemia    Hypoglycemia    Hypothyroidism    Migraine    Pneumothorax    PONV (postoperative nausea and vomiting)    Pre-diabetes    Rectal discharge 07/28/2010   Shortness of breath    Sleep apnea    cannot tolerate-not using CPAP   Sluggishness 07/28/2010   Past Surgical History:  Procedure Laterality Date   ABDOMINAL HYSTERECTOMY     with bladder suspension   CERVICAL FUSION     CHOLECYSTECTOMY     COLONOSCOPY N/A 07/30/2013   Procedure: COLONOSCOPY;  Surgeon: Malissa Hippo, MD;  Location: [facility];  Service: Endoscopy;  Laterality: N/A;  930   EXAM UNDER ANESTHESIA WITH MANIPULATION OF SHOULDER Right 10/11/2016   Procedure: EXAM UNDER ANESTHESIA WITH MANIPULATION OF SHOULDER;  Surgeon: Vickki Hearing, MD;  Location: [facility];  Service: Orthopedics;  Laterality: Right;   HERNIA REPAIR     INCISIONAL HERNIA REPAIR N/A 07/30/2016   Procedure: HERNIA REPAIR INCISIONAL WITH MESH;  Surgeon: [name], MD;  Location:  [facility];  Service: General;  Laterality: N/A;   INTERSTIM IMPLANT PLACEMENT  02/02/2021   Procedure: Leane Platt IMPLANT FIRST STAGE LEFT SACRUM ;  Surgeon: Malen Gauze, MD;  Location: [facility];  Service: Urology;;   Leane Platt IMPLANT PLACEMENT N/A 02/23/2021   Procedure: Leane Platt IMPLANT SECOND STAGE;  Surgeon: Malen Gauze, MD;  Location: [facility];  Service: Urology;  Laterality: N/A;  Rep coming, time needs to stay at 2:00   JOINT REPLACEMENT     LOBECTOMY     right lung    POSTERIOR CERVICAL LAMINECTOMY Right 04/05/2017   Procedure: Right C6-7 Posterior cervical laminectomy;  Surgeon: Donalee Citrin, MD;  Location: [facility] OR;  Service: Neurosurgery;  Laterality: Right;  Right C6-7 Posterior cervical laminectomy   SHOULDER ARTHROSCOPY WITH ROTATOR CUFF REPAIR Right 10/11/2016   Procedure: SHOULDER ARTHROSCOPY;  Surgeon: Vickki Hearing, MD;  Location: [facility];  Service: Orthopedics;  Laterality: Right;   TOTAL HIP ARTHROPLASTY Right 04/15/2020   Procedure: RIGHT TOTAL HIP ARTHROPLASTY ANTERIOR APPROACH;  Surgeon: Kathryne Hitch, MD;  Location: [facility];  Service: Orthopedics;  Laterality: Right;   TOTAL HIP ARTHROPLASTY Left 07/22/2020   Procedure: LEFT  HIP ARTHROPLASTY ANTERIOR APPROACH;  Surgeon: Kathryne Hitch, MD;  Location: [facility];  Service: Orthopedics;  Laterality: Left;  RNFA   TUBAL LIGATION     Patient Active Problem List   Diagnosis Date Noted   Severe persistent asthma without complication 06/06/2022   Gait abnormality 05/28/2022   Adrenal insufficiency 03/30/2022   Endocrine disorder, unspecified 07/04/2021   Prediabetes 07/04/2021   Hypothyroidism 07/04/2021   Former smoker 06/29/2021   Seasonal and perennial allergic rhinitis 06/19/2021   Oxygen dependent 06/19/2021   Steroid dependent 06/19/2021   Immunosuppressed status 06/19/2021   Radiculopathy, lumbar region 12/08/2020   Status post left hip replacement 07/22/2020   Status post right hip  replacement 05/02/2020   Status post total replacement of right hip 04/15/2020   Avascular necrosis of bone of left hip 02/10/2020   Avascular necrosis of bone of right hip 02/10/2020   Allergic rhinitis 05/21/2019   Asthma-COPD overlap syndrome 04/30/2019   Chronic respiratory failure with hypoxia 04/30/2019   Spinal stenosis of cervical region 04/05/2017   Partial tear of right subscapularis tendon    Incisional hernia, without obstruction or gangrene    Urinary frequency 03/21/2012   Migraine equivalent syndrome 03/21/2012   Insomnia 03/21/2012    PCP: Benita StabileHall, John Z, MD  REFERRING PROVIDER: Levert FeinsteinYan, Yijun, MD  REFERRING DIAG: R26.9 (ICD-10-CM) - Gait abnormality  THERAPY DIAG:  Difficulty in walking, not elsewhere classified  Pain in right hip  Rationale for Evaluation and Treatment: Rehabilitation  ONSET DATE: April 12, 2022  SUBJECTIVE:   SUBJECTIVE STATEMENT: Patient  states balance has still been off, waviness is still there. Feels that she is getting better since starting therapy. No falls, does not feel like she can go without her walker.   PROGRESS NOTE 07/10/22: Patient states that she went to her neurologist and was diagnosed with vestibular neuritis. Patient says she will be referred to a neurologist. Patient states that she's still dizzy ("wavy" sensatiob) but not as bad as she used to be. Patient states that her balance is still off but has improved a little since she started PT. Denies any recent falls. Patient thinks she has improved around 25%. With this, patient wishes to continue with PT.   PERTINENT HISTORY: Bladder implant (will have surgery 07/26/2022), hx of B THR, lobectomy PAIN:  Are you having pain? No  PRECAUTIONS: Fall and Other: uses portable O2  WEIGHT BEARING RESTRICTIONS: No  FALLS:  Has patient fallen in last 6 months? Yes. Number of falls 7  LIVING ENVIRONMENT: Lives with: lives with their spouse Lives in: House/apartment Stairs: Yes:  External: 1 steps; none Has following equipment at home: Single point cane, Walker - 2 wheeled, Environmental consultantWalker - 4 wheeled, shower chair, bed side commode, and oxygen  OCCUPATION: on disability  PLOF: Independent with household mobility with device and Requires assistive device for independence  PATIENT GOALS: "to be able to walk straight by myself"  NEXT MD VISIT: Aug 30, 2022  OBJECTIVE:   DIAGNOSTIC FINDINGS:  MRI HEAD WITHOUT CONTRAST 06/08/22   TECHNIQUE: Multiplanar, multiecho pulse sequences of the brain and surrounding structures were obtained without intravenous contrast.   COMPARISON:  Head CT 05/22/2022. Brain MRI 05/22/2011.   FINDINGS: Brain:   No age advanced or lobar predominant parenchymal atrophy.   Multifocal T2 FLAIR hyperintense signal abnormality within the cerebral white matter, overall mild but greater than expected for age.   There is no acute infarct.   No evidence of an intracranial mass.   No chronic intracranial blood products.   No extra-axial fluid collection.   No midline shift.  Vascular: Maintained flow voids within the proximal large arterial vessels.   Skull and upper cervical spine: No focal suspicious calvarial marrow lesion. Cervical spine findings separately reported on same day cervical spine MRI.   Sinuses/Orbits: No mass or acute finding within the imaged orbits. Prior bilateral ocular lens replacement. Trace mucosal thickening within left ethmoid air cells.   Other: Small-volume fluid within the bilateral mastoid air cells.   IMPRESSION: 1.  No evidence of an acute intracranial abnormality. 2. Multifocal T2 FLAIR hyperintense signal abnormality within the cerebral white matter, overall mild but greater than expected for age. These signal changes have slightly progressed from the prior brain MRI of 05/22/2011. Findings are nonspecific and differential considerations include chronic small vessel ischemic disease, sequelae of  chronic migraine headaches, sequelae of a prior infectious/inflammatory process or sequelae of a demyelinating process (such as multiple sclerosis), among others. No lesions specifically suggest demyelinating disease. 3. Small-volume fluid within the bilateral mastoid air cells.  MRI CERVICAL SPINE WITHOUT CONTRAST 06/08/22   TECHNIQUE: Multiplanar, multisequence MR imaging of the cervical spine was performed. No intravenous contrast was administered.   COMPARISON:  Radiographs of the cervical spine 08/06/2017. Cervical spine MRI 02/24/2017.   FINDINGS: Alignment: Straightening of the expected cervical lordosis. 3 mm C7-T1 grade 1 anterolisthesis.   Vertebrae: Vertebral body height is maintained. Susceptibility artifact arising from ACDF hardware at the C4-C7 levels. No appreciable significant marrow edema or focal suspicious osseous lesion.   Cord: No signal abnormality identified within the cervical spinal cord.   Posterior Fossa, vertebral arteries, paraspinal tissues: Posterior fossa assessed on same-day brain MRI. Flow voids preserved within the imaged cervical vertebral arteries. No paraspinal mass or collection.   Disc levels:   Unless otherwise stated, the level by level findings below have not significantly changed from the prior MRI of 03/06/2017.   Disc degeneration at the non-operative levels, greatest at C3-C4 and C7-T1 (progressed at these levels).   C2-C3: Slight disc bulge. No significant spinal canal or foraminal stenosis.   C3-C4: Disc bulge. New superimposed small central disc protrusion (at site of posterior annular fissure). Uncovertebral hypertrophy on the right, new from the prior MRI. Facet arthrosis. Mild relative spinal canal narrowing (without spinal cord mass effect), new from the prior MRI. Moderate right neural foraminal narrowing, also new from the prior MRI.   C4-C5: Prior ACDF. No significant spinal canal or foraminal stenosis.    C5-C6: Prior ACDF. Uncovertebral hypertrophy on the right. No significant spinal canal stenosis. Mild right neural foraminal narrowing.   C6-C7: Prior ACDF. Uncovertebral vertebrae on the right. No significant spinal canal stenosis. Moderate right neural foraminal narrowing.   C7-T1: 3 mm grade 1 anterolisthesis. Mild disc uncovering. Facet arthrosis. No significant spinal canal stenosis. Mild-to-moderate bilateral neural foraminal narrowing.   IMPRESSION: Prior C4-C7 ACDF. No significant spinal canal stenosis at these levels. As before, uncovertebral hypertrophy results in neural foraminal narrowing on the right at C5-C6 (mild), and on the right at C6-C7 (moderate).   Cervical spondylosis at the remaining levels, as outlined and with findings most notably as follows.   At C3-C4, there is a disc bulge. New superimposed small central disc protrusion (at site of posterior annular fissure). Uncovertebral hypertrophy on the right, new from the prior MRI. Facet arthrosis. Mild spinal canal narrowing, and moderate right neural foraminal narrowing, new from the prior MRI.   At C7-T1, there is 3 mm grade 1 anterolisthesis. Mild disc uncovering. Facet arthrosis. No significant spinal canal stenosis. Mild-to-moderate bilateral  neural foraminal narrowing. Findings at this level have not significantly changed.  COGNITION: Overall cognitive status: Within functional limits for tasks assessed     SENSATION: Not tested  MUSCLE LENGTH: (not tested on 07/10/22) Mild restriction on B hamstrings Moderate restriction on B gastrocsoleus  POSTURE: rounded shoulders and forward head   LOWER EXTREMITY MMT  MMT Right eval Left eval Right 07/10/22 Left 07/10/22  Hip flexion 4 4 4 4   Hip extension 2+ (bridging) 2+  (bridging) 3+  (bridging) 3+ (bridging)  Hip abduction 4 4 4 4   Hip adduction      Hip internal rotation      Hip external rotation      Knee flexion 4- 4- 4 4  Knee  extension 4- 3+ 4 4  Ankle dorsiflexion 3+ 4- 4- 4-  Ankle plantarflexion 3+ 3+ 3+ 3+  Ankle inversion      Ankle eversion       (Blank rows = not tested)  LOWER EXTREMITY ROM  Active ROM Right eval Left eval  Hip flexion [facility]  Hip extension    Hip abduction [facility]  Hip adduction [facility]  Hip internal rotation    Hip external rotation    Knee flexion [facility] WFL  Knee extension [facility]  Ankle dorsiflexion [facility] WFL  Ankle plantarflexion [facility] WFL  Ankle inversion    Ankle eversion     (Blank rows = not tested)  FUNCTIONAL TESTS:  5 times sit to stand: 18.88 sec (little assist from rollator) from 26.97 sec (has to hold on to rollator) 2 minute walk test: 304 ft (with rollator) from 224 ft (with a rollator) Tinetti POMA : 14 from 9 (balance + gait)  GAIT: Distance walked: 304 ft Assistive device utilized:  rollator Level of assistance: Modified independence Comments: foot clearance on B more evident, normal step length on B  VESTIBULAR ASSESSMENT  06/21/22 07/10/22  Head impulse test positive with corrective saccades, dizziness reported positive with corrective saccades, dizziness reported  VOR 1 corrective saccades, dizziness reported corrective saccades, dizziness reported  Optokinetic reflex (R/L)  impaired, dizziness reported  impaired, dizziness reported    TODAY'S TREATMENT:                                                                                                                              DATE:  07/17/22 Standing HR/TR 2 x 10 Step up 4 inch 1 x 10 bilateral UE support, 1 x 10 unilateral UE support bilateral  Rhytmic stabilization x ant/post and lateral x 3" x 10 x 1, no UE support Lateral stepping 4 x 8 feet bilateral CGA Hip vectors GTB at calf 2 x 5 bilateral  Sit-to-stand, seat slightly elevated (emphasis on scooting, heel placement back, and trunk momentum), CGA x 10; 5 x with emphasis on corkscrew of feet  07/13/2022 Cervical neck AROM flex/ext, rot x  10 Gastrocnemius slant board stretch x 30" x 3 Sit-to-stand,  seat slightly elevated (emphasis on scooting, heel placement back, and trunk momentum), CGA x 10 Standing, firm surface, normal BOS:  Horizontal saccades, while reading words x 1'  Vertical saccades, while reading numbers x 1'  Horizontal smooth pursuit, identifying animal silhouettes x 1'  Vertical smooth pursuit, identifying/counting colors/shapes x 1'  VOR 1, own pace, identifying colors x 1'  VOR cancellation, identifying shapes x 30"  Heel/toe raises, no UE support x 10 x 2  Tandem stance x 30" x 2, eyes open, light UE support Static standing, eyes open x 30" x 2, no UE support  Rhytmic stabilization x ant/post x 3" x 10 x 2, no UE support  Rocking front/back, L arm swing, R LE forward x 30", R UE support  Mini squats 3" x 10 x 2, no UE support  TKE, GTB x 10 x 2 x 3"  Hip vectors x 10 x 2, RTB  07/12/2022 Cervical neck AROM flex/ext, rot x 10 Gastrocnemius slant board stretch x 30" x 3 Sit-to-stand, seat slightly elevated (emphasis on scooting, heel placement back, and trunk momentum), CGA Standing, firm surface, normal BOS:  Horizontal saccades, while reading words x 1'  Vertical saccades, while reading words x 1'  Horizontal smooth pursuit, identifying animal silhouettes x 1'  Vertical smooth pursuit, identifying colors x 1'  VOR 1, own pace, identifying numbers x 1'  VOR cancellation, identifying shapes x 30"  Heel/toe raises with 1 UE support x 10 x 2  Tandem stance x 30" x 2, eyes open, light UE support Static standing, eyes open x 30" x 2 Static standing with head turns x 30" x 2  Rhytmic stabilization x ant/post x 3" x 10 x 2  Mini squats 3" x 10 x 2, no UE support  TKE, RTB x 10 x 2 x 3"  Hip ext x 10 x 2, RTB  07/10/2022 Progress note ( , Tinetti, vestibular exam, ROM, MMT, 5TSTS) Cervical neck AROM flex/ext, rot x 10 Standing, firm surface, normal BOS:  Horizontal saccades, while reading words x  1'  Vertical saccades, while identifying numbers x 1'  Horizontal smooth pursuit, identifying color/shapes x 1'  Vertical smooth pursuit, identifying animal silhouettes x 1'  VOR 1, own pace, identifying numbers x 1'  07/05/2022 Seated:  Cervical neck AROM flex/ext, rot x 10  Horizontal saccades, while reading words x 1'  Vertical saccades, while identifying numbers x 1'  Horizontal smooth pursuit, identifying color/shapes x 1'  Vertical smooth pursuit, identifying animal silhouettes x 1'  VOR 1, 70 bpm, identifying numbers x 1'  VOR cancellation, identifying colors x 1' Gastrocnemius slant board stretch x 30" x 3 Standing on firm surface, normal BOS:  Heel/toe raises with 1 UE support x 10 x 2  Tandem stance x 30" x 2, eyes open, light UE support Static standing, eyes open x 30" x 2 Static standing with head turns x 30" x 2  Rhytmic stabilization x ant/post x 3" x 10 x 2  Mini squats 3" x 10 x 2, no UE support  TKE, RTB x 10 x 2 x 3"  Hip ext x 10 x 2  07/02/22 Sitting: AROM of the cervical spine flexion/extension, rotation x 10 Gastroc stretch with strap 5 x 20" each  Standing: Heel/toe raises 2 x 10 Hip abduction and extension 2 x 10 each Tandem stance 2 x 30" fingertip support Small base of support standing 3 x 30" no UE support Shoulder width stance with light support with  head turns and nods x 10 each  06/28/2022 Seated:  Cervical neck AROM flex/ext, rot x 10  Gastrocnemius strap stretch x 30" x 3  Horizontal saccades, while identifying numbers x 1'  Vertical saccades, while reading words x 1'  Horizontal smooth pursuit, identifying animal silhouettes x 1'  Vertical smooth pursuit, identifying colors/shapes x 1'  VOR 1, 70 bpm, identifying numbers x 1'  VOR cancellation, reading words x 1' Gastrocnemius slant board stretch x 30" x 3 Standing on firm surface, normal BOS:  Heel/toe raises with 1 UE support x 10 x 2  Tandem stance x 30" x 2, eyes open, light UE  support Static standing, eyes open x 30"  Static standing with head turns x 30"  Rhytmic stabilization x ant/post x 3" x 10 x 2  Mini squats 3" x 10 x 2, no UE support  TKE, RTB x 10 x 2 x 3"  Hip ext x 10 x 2  06/27/2022 Seated:  Cervical neck AROM flex/ext, rot x 10  Gastrocnemius strap stretch x 30" x 3  Horizontal saccades, while identifying numbers x 1'  Vertical saccades, while reading words x 1'  Horizontal smooth pursuit, identifying animal silhouettes x 1'  Vertical smooth pursuit, identifying colors/shapes x 1'  VOR 1, 70 bpm, identifying numbers x 1'  VOR cancellation, reading words x 1' Standing on firm surface, normal BOS:  Heel/toe raises with 1 UE support x 10 x 2  Tandem stance x 30" x 3, eyes open, light UE support Static standing, eyes open x 30"  Static standing with head turns x 30"  Rhytmic stabilization x ant/post x 3" x 10  06/21/2022 Seated:  Cervical neck AROM flex/ext, rot x 10  Horizontal saccades, while identifying numbers x 1'  Vertical saccades, while reading words x 1'  Horizontal smooth pursuit, identifying animal silhouettes x 1'  Vertical smooth pursuit, identifying colors/shapes x 1'  VOR 1, 60 bpm, identifying numbers x 1'  VOR cancellation, identifying colors x 1'  06/18/2022 -Education for pt's explanation of visual disturbances during walking; Attempted VOR assessment but limited Head ROM due to previous surgieies.  -Habituation exercises daily at home VOR x 1 3x daily. - 1 x 5  elevated dead lift with weighed balls with 10in box  - 2x 5Weighted sit/stands from elevated mat with yellow weighted ball.  2  06/15/22 Evaluation and patient education done    PATIENT EDUCATION:  Education details: Updated HEP. Instructed patient to perform standing exercises on with a stable support (e.g. kitchen counter) and have her husband next to her. Person educated: Patient Education method: Explanation Education comprehension: verbalized  understanding  HOME EXERCISE PROGRAM: Access Code: [email or web address] URL: [email or web address] 07/17/22 - Sit to Stand with Arms Crossed  - 1 x daily - 7 x weekly - 1-2 sets - 10 reps  07/13/22: - Standing 3-Way Leg Reach with Resistance at Ankles and Counter Support  - 1 x daily - 7 x weekly - 2 sets - 10 reps  07/02/22 hip abduction, small bos standing and regular standing head turns and nods  06/28/2022 - Mini Squat with Counter Support  - 1-2 x daily - 5-7 x weekly - 2 sets - 10 reps - 3 hold - Standing Hip Extension with Counter Support  - 1-2 x daily - 5-7 x weekly - 2 sets - 10 reps  Date: 06/27/2022 Prepared by: Krystal Clark  Exercises - Seated Calf Stretch with Strap  - 1-2 x daily - 5-7  x weekly - 3 reps - 30 hold - Standing Tandem Balance with Counter Support  - 1-2 x daily - 5-7 x weekly - 30 hold - Heel Toe Raises with Counter Support  - 1-2 x daily - 5-7 x weekly - 2 sets - 10 reps  06/21/2022 GAZE STABILIZATION EXERCISES (2-3x/day, 5-7 days/week, seated) These exercises are designed to improve your eyes' ability to track objects without getting dizzy. When performing these exercises, make sure you are facing a blank wall (like a white wall) to avoid distraction. You can wear your glasses/contact lenses if you must.   Before anything, warm-up your neck muscles by bending it forward and back 10 times, side-to-side 10 times, and slowly turn your head left and right 10 time Exercise 1 (Horizontal Saccades) Use two cards for this exercise While holding the cards on each hand side by side in sitting with arms outstretched, look at the first item on the other card then look at the next item on the second card then alternate your gaze sequentially. Do NOT move your cards nor the your head. Do the following for one minute each Identifying numbers Identifying shapes Exercise 2 (Par[employer] x1) Use only one card for this exercise While holding the card in sitting with your  arm outstretched, turn your head left and right at your own pace while keeping the card stationary.  Do this for one minute each while you are: Identifying numbers Identifying shapes Exercise 3 (VOR cancellation) Use only one card for this exercise While holding the card in sitting with your arms outstretched, turn your body, arm, and head at the same time left and right at your own pace Do this for one minute each while you are: Identifying numbers Identifying shapes  3/11/20204 - VOR x 1 Habituation Exercises 3x daily - Sit/stands w/ 4.5lb weight at home 2 x 5 daily.  ASSESSMENT:  CLINICAL IMPRESSION: Patient given CGA for most standing exercises due to intermittent unsteadiness. Continued with functional strengthening and balance training with emphasis on reducing UE support when able. Apprehensive with lateral stepping without UE support but able to complete with mild unsteadiness but no loss of balance. STS mechanics improve with cueing for increased knee flexion, pressure through mid-foot, and "corkscrewing" feet to improve LE muscle activation rather than just glutes. Patient will continue to benefit from physical therapy in order to improve function and reduce impairment.   PROGRESS NOTE 07/12/22: Patient demonstrated continued improvements in function as indicated by positive significant changes in , 5TSTS, and strength. However, patient still presents with deficits in vestibular function which greatly affects her standing balance. Patient still presents as a falls risk based on the Tinetti score. With this, skilled PT is still required to address the impairments and functional limitations listed below.  Interventions today were geared towards gaze stabilization incorporated with her standing balance. Patient tolerated gaze stabilization activities with mild dizziness and unsteadiness on the saccades, VOR 1 and cancellation exercises. Demonstrated appropriate levels of fatigue. Rest  periods provided. Provided slight amount of cueing to ensure correct execution of activity with good carry-over.    OBJECTIVE IMPAIRMENTS: Abnormal gait, decreased balance, decreased coordination, decreased endurance, difficulty walking, decreased strength, impaired flexibility, and pain.   ACTIVITY LIMITATIONS: carrying, lifting, bending, standing, squatting, stairs, transfers, continence, bathing, toileting, dressing, self feeding, hygiene/grooming, and locomotion level  PARTICIPATION LIMITATIONS: meal prep, cleaning, laundry, driving, shopping, community activity, and yard work  PERSONAL FACTORS: Fitness level, Bladder implant (will have surgery 07/26/2022), hx of  B THR, lobectomy are also affecting patient's functional outcome.   REHAB POTENTIAL: Fair    CLINICAL DECISION MAKING: Evolving/moderate complexity  EVALUATION COMPLEXITY: Moderate   GOALS: Goals reviewed with patient? Yes  SHORT TERM GOALS: Target date: 07/13/2022  Pt will demonstrate indep in HEP to facilitate carry-over of skilled services and improve functional outcomes Goal status: MET  LONG TERM GOALS: Target date: 08/10/2022  Pt will be report little to no dizziness on VOR 1 and head impulse test to facilitate safety on ambulation Baseline: with dizziness Goal status: INITIAL/NEW   Pt will decrease 5TSTS by at least 5 seconds in order to demonstrate clinically significant improvement in LE strength  Baseline: 18.88 sec Goal status: REVISED  3.  Patient will demonstrate increase in Tinetti Score by 8 points in order to demonstrate clinically significant improvement in balance and decreased risk for falls  Baseline: 9 Goal status: IN PROGRESS  4.  Pt will increase by at least 40 ft in order to demonstrate clinically significant improvement in community ambulation Baseline: 304 ft Goal status: REVISED  5.  Pt will demonstrate increase in LE strength to 4 to facilitate ease and safety in  ambulation Baseline: 2+ Goal status: IN PROGRESS  6.  Pt will demonstrate improved LE coordination as manifested by performing 3/4 correct trials in heel-to-shin to facilitate ease in ambulation Baseline: impaired Goal status: INITIAL  7.  Pt will be able to walk with a quad cane for > 50 ft Baseline: walks with a rollator Goal status: REVISED   PLAN:  PT FREQUENCY: 3x/week  PT DURATION: 4 weeks  PLANNED INTERVENTIONS: Therapeutic exercises, Therapeutic activity, Neuromuscular re-education, Balance training, Gait training, Patient/Family education, Self Care, and Stair training  PLAN FOR NEXT SESSION: Continue POC and may progress as tolerated with emphasis on balance, LE strengthening, flexibility, and vestibular function.   2:57 PM, 07/17/22 Wyman Songster PT, DPT Physical Therapist at [facility]

## 2022-07-19 ENCOUNTER — Ambulatory Visit (HOSPITAL_COMMUNITY): Payer: Medicare Other

## 2022-07-19 DIAGNOSIS — R262 Difficulty in walking, not elsewhere classified: Secondary | ICD-10-CM

## 2022-07-19 NOTE — Therapy (Signed)
OUTPATIENT PHYSICAL THERAPY LOWER EXTREMITY TREATMENT NOTE   Patient Name: [name] MRN: [id number] DOB:09-09-1965, 57 y.o., female Today's Date: 07/19/2022  END OF SESSION:  PT End of Session - 07/19/22 1524     Visit Number 12    Number of Visits 20    Date for PT Re-Evaluation 08/10/22    Authorization Type BCBS Medicare, Medicaid of [place]    PT Start Time 1345    PT Stop Time 1430    PT Time Calculation (min) 45 min    Equipment Utilized During Treatment Gait belt;Oxygen    Activity Tolerance Patient tolerated treatment well    Behavior During Therapy WFL for tasks assessed/performed            Past Surgical History:  Procedure Laterality Date   ABDOMINAL HYSTERECTOMY     with bladder suspension   CERVICAL FUSION     CHOLECYSTECTOMY     COLONOSCOPY N/A 07/30/2013   Procedure: COLONOSCOPY;  Surgeon: Malissa Hippo, MD;  Location: [facility];  Service: Endoscopy;  Laterality: N/A;  930   EXAM UNDER ANESTHESIA WITH MANIPULATION OF SHOULDER Right 10/11/2016   Procedure: EXAM UNDER ANESTHESIA WITH MANIPULATION OF SHOULDER;  Surgeon: Vickki Hearing, MD;  Location: [facility];  Service: Orthopedics;  Laterality: Right;   HERNIA REPAIR     INCISIONAL HERNIA REPAIR N/A 07/30/2016   Procedure: HERNIA REPAIR INCISIONAL WITH MESH;  Surgeon: [name], MD;  Location: [facility];  Service: General;  Laterality: N/A;   INTERSTIM IMPLANT PLACEMENT  02/02/2021   Procedure: Leane Platt IMPLANT FIRST STAGE LEFT SACRUM ;  Surgeon: Malen Gauze, MD;  Location: [facility];  Service: Urology;;   Leane Platt IMPLANT PLACEMENT N/A 02/23/2021   Procedure: Leane Platt IMPLANT SECOND STAGE;  Surgeon: Malen Gauze, MD;  Location: [facility];  Service: Urology;  Laterality: N/A;  Rep coming, time needs to stay at 2:00   JOINT REPLACEMENT     LOBECTOMY     right lung    POSTERIOR CERVICAL LAMINECTOMY Right 04/05/2017   Procedure: Right C6-7 Posterior cervical laminectomy;  Surgeon:  Donalee Citrin, MD;  Location: [facility] OR;  Service: Neurosurgery;  Laterality: Right;  Right C6-7 Posterior cervical laminectomy   SHOULDER ARTHROSCOPY WITH ROTATOR CUFF REPAIR Right 10/11/2016   Procedure: SHOULDER ARTHROSCOPY;  Surgeon: Vickki Hearing, MD;  Location: [facility];  Service: Orthopedics;  Laterality: Right;   TOTAL HIP ARTHROPLASTY Right 04/15/2020   Procedure: RIGHT TOTAL HIP ARTHROPLASTY ANTERIOR APPROACH;  Surgeon: Kathryne Hitch, MD;  Location: [facility];  Service: Orthopedics;  Laterality: Right;   TOTAL HIP ARTHROPLASTY Left 07/22/2020   Procedure: LEFT  HIP ARTHROPLASTY ANTERIOR APPROACH;  Surgeon: Kathryne Hitch, MD;  Location: [facility];  Service: Orthopedics;  Laterality: Left;  RNFA   TUBAL LIGATION     Patient Active Problem List   Diagnosis Date Noted   Severe persistent asthma without complication 06/06/2022   Gait abnormality 05/28/2022   Adrenal insufficiency 03/30/2022   Endocrine disorder, unspecified 07/04/2021   Prediabetes 07/04/2021   Hypothyroidism 07/04/2021   Former smoker 06/29/2021   Seasonal and perennial allergic rhinitis 06/19/2021   Oxygen dependent 06/19/2021   Steroid dependent 06/19/2021   Immunosuppressed status 06/19/2021   Radiculopathy, lumbar region 12/08/2020   Status post left hip replacement 07/22/2020   Status post right hip replacement 05/02/2020   Status post total replacement of right hip 04/15/2020   Avascular necrosis of bone of left hip 02/10/2020   Avascular  necrosis of bone of right hip 02/10/2020   Allergic rhinitis 05/21/2019   Asthma-COPD overlap syndrome 04/30/2019   Chronic respiratory failure with hypoxia 04/30/2019   Spinal stenosis of cervical region 04/05/2017   Partial tear of right subscapularis tendon    Incisional hernia, without obstruction or gangrene    Urinary frequency 03/21/2012   Migraine equivalent syndrome 03/21/2012   Insomnia 03/21/2012    PCP: Benita StabileHall, John Z, MD  REFERRING PROVIDER:  Levert FeinsteinYan, Yijun, MD  REFERRING DIAG: R26.9 (ICD-10-CM) - Gait abnormality  THERAPY DIAG:  Difficulty in walking, not elsewhere classified  Rationale for Evaluation and Treatment: Rehabilitation  ONSET DATE: April 12, 2022  SUBJECTIVE:   SUBJECTIVE STATEMENT: Patient feels that she's a little disoriented today. States that her sugar and O2 levels are good. PT took BP and it's at 105/55 mmHg. Patient states that her BP is usually within that range.  PROGRESS NOTE 07/10/22: Patient states that she went to her neurologist and was diagnosed with vestibular neuritis. Patient says she will be referred to a neurologist. Patient states that she's still dizzy ("wavy" sensatiob) but not as bad as she used to be. Patient states that her balance is still off but has improved a little since she started PT. Denies any recent falls. Patient thinks she has improved around 25%. With this, patient wishes to continue with PT.   PERTINENT HISTORY: Bladder implant (will have surgery 07/26/2022), hx of B THR, lobectomy PAIN:  Are you having pain? No  PRECAUTIONS: Fall and Other: uses portable O2  WEIGHT BEARING RESTRICTIONS: No  FALLS:  Has patient fallen in last 6 months? Yes. Number of falls 7  LIVING ENVIRONMENT: Lives with: lives with their spouse Lives in: House/apartment Stairs: Yes: External: 1 steps; none Has following equipment at home: Single point cane, Walker - 2 wheeled, Environmental consultantWalker - 4 wheeled, shower chair, bed side commode, and oxygen  OCCUPATION: on disability  PLOF: Independent with household mobility with device and Requires assistive device for independence  PATIENT GOALS: "to be able to walk straight by myself"  NEXT MD VISIT: Aug 30, 2022  OBJECTIVE:   DIAGNOSTIC FINDINGS:  MRI HEAD WITHOUT CONTRAST 06/08/22   TECHNIQUE: Multiplanar, multiecho pulse sequences of the brain and surrounding structures were obtained without intravenous contrast.   COMPARISON:  Head CT 05/22/2022.  Brain MRI 05/22/2011.   FINDINGS: Brain:   No age advanced or lobar predominant parenchymal atrophy.   Multifocal T2 FLAIR hyperintense signal abnormality within the cerebral white matter, overall mild but greater than expected for age.   There is no acute infarct.   No evidence of an intracranial mass.   No chronic intracranial blood products.   No extra-axial fluid collection.   No midline shift.   Vascular: Maintained flow voids within the proximal large arterial vessels.   Skull and upper cervical spine: No focal suspicious calvarial marrow lesion. Cervical spine findings separately reported on same day cervical spine MRI.   Sinuses/Orbits: No mass or acute finding within the imaged orbits. Prior bilateral ocular lens replacement. Trace mucosal thickening within left ethmoid air cells.   Other: Small-volume fluid within the bilateral mastoid air cells.   IMPRESSION: 1.  No evidence of an acute intracranial abnormality. 2. Multifocal T2 FLAIR hyperintense signal abnormality within the cerebral white matter, overall mild but greater than expected for age. These signal changes have slightly progressed from the prior brain MRI of 05/22/2011. Findings are nonspecific and differential considerations include chronic small vessel ischemic disease, sequelae  of chronic migraine headaches, sequelae of a prior infectious/inflammatory process or sequelae of a demyelinating process (such as multiple sclerosis), among others. No lesions specifically suggest demyelinating disease. 3. Small-volume fluid within the bilateral mastoid air cells.  MRI CERVICAL SPINE WITHOUT CONTRAST 06/08/22   TECHNIQUE: Multiplanar, multisequence MR imaging of the cervical spine was performed. No intravenous contrast was administered.   COMPARISON:  Radiographs of the cervical spine 08/06/2017. Cervical spine MRI 02/24/2017.   FINDINGS: Alignment: Straightening of the expected cervical  lordosis. 3 mm C7-T1 grade 1 anterolisthesis.   Vertebrae: Vertebral body height is maintained. Susceptibility artifact arising from ACDF hardware at the C4-C7 levels. No appreciable significant marrow edema or focal suspicious osseous lesion.   Cord: No signal abnormality identified within the cervical spinal cord.   Posterior Fossa, vertebral arteries, paraspinal tissues: Posterior fossa assessed on same-day brain MRI. Flow voids preserved within the imaged cervical vertebral arteries. No paraspinal mass or collection.   Disc levels:   Unless otherwise stated, the level by level findings below have not significantly changed from the prior MRI of 03/06/2017.   Disc degeneration at the non-operative levels, greatest at C3-C4 and C7-T1 (progressed at these levels).   C2-C3: Slight disc bulge. No significant spinal canal or foraminal stenosis.   C3-C4: Disc bulge. New superimposed small central disc protrusion (at site of posterior annular fissure). Uncovertebral hypertrophy on the right, new from the prior MRI. Facet arthrosis. Mild relative spinal canal narrowing (without spinal cord mass effect), new from the prior MRI. Moderate right neural foraminal narrowing, also new from the prior MRI.   C4-C5: Prior ACDF. No significant spinal canal or foraminal stenosis.   C5-C6: Prior ACDF. Uncovertebral hypertrophy on the right. No significant spinal canal stenosis. Mild right neural foraminal narrowing.   C6-C7: Prior ACDF. Uncovertebral vertebrae on the right. No significant spinal canal stenosis. Moderate right neural foraminal narrowing.   C7-T1: 3 mm grade 1 anterolisthesis. Mild disc uncovering. Facet arthrosis. No significant spinal canal stenosis. Mild-to-moderate bilateral neural foraminal narrowing.   IMPRESSION: Prior C4-C7 ACDF. No significant spinal canal stenosis at these levels. As before, uncovertebral hypertrophy results in neural foraminal narrowing on  the right at C5-C6 (mild), and on the right at C6-C7 (moderate).   Cervical spondylosis at the remaining levels, as outlined and with findings most notably as follows.   At C3-C4, there is a disc bulge. New superimposed small central disc protrusion (at site of posterior annular fissure). Uncovertebral hypertrophy on the right, new from the prior MRI. Facet arthrosis. Mild spinal canal narrowing, and moderate right neural foraminal narrowing, new from the prior MRI.   At C7-T1, there is 3 mm grade 1 anterolisthesis. Mild disc uncovering. Facet arthrosis. No significant spinal canal stenosis. Mild-to-moderate bilateral neural foraminal narrowing. Findings at this level have not significantly changed.  COGNITION: Overall cognitive status: Within functional limits for tasks assessed     SENSATION: Not tested  MUSCLE LENGTH: (not tested on 07/10/22) Mild restriction on B hamstrings Moderate restriction on B gastrocsoleus  POSTURE: rounded shoulders and forward head   LOWER EXTREMITY MMT  MMT Right eval Left eval Right 07/10/22 Left 07/10/22  Hip flexion 4 4 4 4   Hip extension 2+ (bridging) 2+  (bridging) 3+  (bridging) 3+ (bridging)  Hip abduction 4 4 4 4   Hip adduction      Hip internal rotation      Hip external rotation      Knee flexion 4- 4- 4 4  Knee extension  4- 3+ 4 4  Ankle dorsiflexion 3+ 4- 4- 4-  Ankle plantarflexion 3+ 3+ 3+ 3+  Ankle inversion      Ankle eversion       (Blank rows = not tested)  LOWER EXTREMITY ROM  Active ROM Right eval Left eval  Hip flexion [facility]  Hip extension    Hip abduction [facility]  Hip adduction [facility]  Hip internal rotation    Hip external rotation    Knee flexion [facility] WFL  Knee extension [facility]  Ankle dorsiflexion [facility] WFL  Ankle plantarflexion [facility] WFL  Ankle inversion    Ankle eversion     (Blank rows = not tested)  FUNCTIONAL TESTS:  5 times sit to stand: 18.88 sec (little assist from rollator) from 26.97 sec  (has to hold on to rollator) 2 minute walk test: 304 ft (with rollator) from 224 ft (with a rollator) Tinetti POMA : 14 from 9 (balance + gait)  GAIT: Distance walked: 304 ft Assistive device utilized:  rollator Level of assistance: Modified independence Comments: foot clearance on B more evident, normal step length on B  VESTIBULAR ASSESSMENT  06/21/22 07/10/22  Head impulse test positive with corrective saccades, dizziness reported positive with corrective saccades, dizziness reported  VOR 1 corrective saccades, dizziness reported corrective saccades, dizziness reported  Optokinetic reflex (R/L)  impaired, dizziness reported  impaired, dizziness reported    TODAY'S TREATMENT:                                                                                                                              DATE:  07/19/2022 Cervical neck AROM flex/ext, rot x 10 Gastrocnemius slant board stretch x 30" x 3 Sit-to-stand, (emphasis on scooting, heel placement back, and trunk momentum), CGA x 10 Standing, firm surface, normal BOS:  Horizontal saccades, while reading words x 1'  Vertical saccades, while reading numbers x 1'  Horizontal smooth pursuit, identifying animal silhouettes x 1'  Vertical smooth pursuit, identifying/counting colors/shapes x 1'  VOR 1, own pace, identifying colors x 1'  VOR cancellation, reading numbers x 30"  Heel/toe raises, no UE support x 10 x 2  Tandem stance x 30" x 2, eyes open, light UE support  Rhytmic stabilization x ant/post x 3" x 10 x 2, no UE support  Rocking front/back, L arm swing, R LE forward x 30", R UE support  Mini squats 3" x 10 x 2, no UE support  TKE, GTB x 10 x 2 x 3"  Hip vectors x 10 x 2, GTB  07/17/22 Standing HR/TR 2 x 10 Step up 4 inch 1 x 10 bilateral UE support, 1 x 10 unilateral UE support bilateral  Rhytmic stabilization x ant/post and lateral x 3" x 10 x 1, no UE support Lateral stepping 4 x 8 feet bilateral CGA Hip vectors GTB at  calf 2 x 5 bilateral  Sit-to-stand, seat slightly elevated (emphasis on scooting, heel  placement back, and trunk momentum), CGA x 10; 5 x with emphasis on corkscrew of feet  07/13/2022 Cervical neck AROM flex/ext, rot x 10 Gastrocnemius slant board stretch x 30" x 3 Sit-to-stand, seat slightly elevated (emphasis on scooting, heel placement back, and trunk momentum), CGA x 10 Standing, firm surface, normal BOS:  Horizontal saccades, while reading words x 1'  Vertical saccades, while reading numbers x 1'  Horizontal smooth pursuit, identifying animal silhouettes x 1'  Vertical smooth pursuit, identifying/counting colors/shapes x 1'  VOR 1, own pace, identifying colors x 1'  VOR cancellation, identifying shapes x 30"  Heel/toe raises, no UE support x 10 x 2  Tandem stance x 30" x 2, eyes open, light UE support Static standing, eyes open x 30" x 2, no UE support  Rhytmic stabilization x ant/post x 3" x 10 x 2, no UE support  Rocking front/back, L arm swing, R LE forward x 30", R UE support  Mini squats 3" x 10 x 2, no UE support  TKE, GTB x 10 x 2 x 3"  Hip vectors x 10 x 2, RTB  07/12/2022 Cervical neck AROM flex/ext, rot x 10 Gastrocnemius slant board stretch x 30" x 3 Sit-to-stand, seat slightly elevated (emphasis on scooting, heel placement back, and trunk momentum), CGA Standing, firm surface, normal BOS:  Horizontal saccades, while reading words x 1'  Vertical saccades, while reading words x 1'  Horizontal smooth pursuit, identifying animal silhouettes x 1'  Vertical smooth pursuit, identifying colors x 1'  VOR 1, own pace, identifying numbers x 1'  VOR cancellation, identifying shapes x 30"  Heel/toe raises with 1 UE support x 10 x 2  Tandem stance x 30" x 2, eyes open, light UE support Static standing, eyes open x 30" x 2 Static standing with head turns x 30" x 2  Rhytmic stabilization x ant/post x 3" x 10 x 2  Mini squats 3" x 10 x 2, no UE support  TKE, RTB x 10 x 2 x  3"  Hip ext x 10 x 2, RTB  07/10/2022 Progress note ( , Tinetti, vestibular exam, ROM, MMT, 5TSTS) Cervical neck AROM flex/ext, rot x 10 Standing, firm surface, normal BOS:  Horizontal saccades, while reading words x 1'  Vertical saccades, while identifying numbers x 1'  Horizontal smooth pursuit, identifying color/shapes x 1'  Vertical smooth pursuit, identifying animal silhouettes x 1'  VOR 1, own pace, identifying numbers x 1'  07/05/2022 Seated:  Cervical neck AROM flex/ext, rot x 10  Horizontal saccades, while reading words x 1'  Vertical saccades, while identifying numbers x 1'  Horizontal smooth pursuit, identifying color/shapes x 1'  Vertical smooth pursuit, identifying animal silhouettes x 1'  VOR 1, 70 bpm, identifying numbers x 1'  VOR cancellation, identifying colors x 1' Gastrocnemius slant board stretch x 30" x 3 Standing on firm surface, normal BOS:  Heel/toe raises with 1 UE support x 10 x 2  Tandem stance x 30" x 2, eyes open, light UE support Static standing, eyes open x 30" x 2 Static standing with head turns x 30" x 2  Rhytmic stabilization x ant/post x 3" x 10 x 2  Mini squats 3" x 10 x 2, no UE support  TKE, RTB x 10 x 2 x 3"  Hip ext x 10 x 2  07/02/22 Sitting: AROM of the cervical spine flexion/extension, rotation x 10 Gastroc stretch with strap 5 x 20" each  Standing: Heel/toe raises 2 x  10 Hip abduction and extension 2 x 10 each Tandem stance 2 x 30" fingertip support Small base of support standing 3 x 30" no UE support Shoulder width stance with light support with head turns and nods x 10 each  06/28/2022 Seated:  Cervical neck AROM flex/ext, rot x 10  Gastrocnemius strap stretch x 30" x 3  Horizontal saccades, while identifying numbers x 1'  Vertical saccades, while reading words x 1'  Horizontal smooth pursuit, identifying animal silhouettes x 1'  Vertical smooth pursuit, identifying colors/shapes x 1'  VOR 1, 70 bpm, identifying  numbers x 1'  VOR cancellation, reading words x 1' Gastrocnemius slant board stretch x 30" x 3 Standing on firm surface, normal BOS:  Heel/toe raises with 1 UE support x 10 x 2  Tandem stance x 30" x 2, eyes open, light UE support Static standing, eyes open x 30"  Static standing with head turns x 30"  Rhytmic stabilization x ant/post x 3" x 10 x 2  Mini squats 3" x 10 x 2, no UE support  TKE, RTB x 10 x 2 x 3"  Hip ext x 10 x 2  06/27/2022 Seated:  Cervical neck AROM flex/ext, rot x 10  Gastrocnemius strap stretch x 30" x 3  Horizontal saccades, while identifying numbers x 1'  Vertical saccades, while reading words x 1'  Horizontal smooth pursuit, identifying animal silhouettes x 1'  Vertical smooth pursuit, identifying colors/shapes x 1'  VOR 1, 70 bpm, identifying numbers x 1'  VOR cancellation, reading words x 1' Standing on firm surface, normal BOS:  Heel/toe raises with 1 UE support x 10 x 2  Tandem stance x 30" x 3, eyes open, light UE support Static standing, eyes open x 30"  Static standing with head turns x 30"  Rhytmic stabilization x ant/post x 3" x 10  06/21/2022 Seated:  Cervical neck AROM flex/ext, rot x 10  Horizontal saccades, while identifying numbers x 1'  Vertical saccades, while reading words x 1'  Horizontal smooth pursuit, identifying animal silhouettes x 1'  Vertical smooth pursuit, identifying colors/shapes x 1'  VOR 1, 60 bpm, identifying numbers x 1'  VOR cancellation, identifying colors x 1'  06/18/2022 -Education for pt's explanation of visual disturbances during walking; Attempted VOR assessment but limited Head ROM due to previous surgieies.  -Habituation exercises daily at home VOR x 1 3x daily. - 1 x 5  elevated dead lift with weighed balls with 10in box  - 2x 5Weighted sit/stands from elevated mat with yellow weighted ball.  2  06/15/22 Evaluation and patient education done    PATIENT EDUCATION:  Education details: Updated HEP.  Instructed patient to perform standing exercises on with a stable support (e.g. kitchen counter) and have her husband next to her. Person educated: Patient Education method: Explanation Education comprehension: verbalized understanding  HOME EXERCISE PROGRAM: Access Code: [email or web address] URL: [email or web address] 07/17/22 - Sit to Stand with Arms Crossed  - 1 x daily - 7 x weekly - 1-2 sets - 10 reps  07/13/22: - Standing 3-Way Leg Reach with Resistance at Ankles and Counter Support  - 1 x daily - 7 x weekly - 2 sets - 10 reps  07/02/22 hip abduction, small bos standing and regular standing head turns and nods  06/28/2022 - Mini Squat with Counter Support  - 1-2 x daily - 5-7 x weekly - 2 sets - 10 reps - 3 hold - Standing Hip Extension with Counter Support  - 1-2  x daily - 5-7 x weekly - 2 sets - 10 reps  Date: 06/27/2022 Prepared by: Krystal Clark  Exercises - Seated Calf Stretch with Strap  - 1-2 x daily - 5-7 x weekly - 3 reps - 30 hold - Standing Tandem Balance with Counter Support  - 1-2 x daily - 5-7 x weekly - 30 hold - Heel Toe Raises with Counter Support  - 1-2 x daily - 5-7 x weekly - 2 sets - 10 reps  06/21/2022 GAZE STABILIZATION EXERCISES (2-3x/day, 5-7 days/week, seated) These exercises are designed to improve your eyes' ability to track objects without getting dizzy. When performing these exercises, make sure you are facing a blank wall (like a white wall) to avoid distraction. You can wear your glasses/contact lenses if you must.   Before anything, warm-up your neck muscles by bending it forward and back 10 times, side-to-side 10 times, and slowly turn your head left and right 10 time Exercise 1 (Horizontal Saccades) Use two cards for this exercise While holding the cards on each hand side by side in sitting with arms outstretched, look at the first item on the other card then look at the next item on the second card then alternate your gaze sequentially. Do  NOT move your cards nor the your head. Do the following for one minute each Identifying numbers Identifying shapes Exercise 2 (Parad[employer] x1) Use only one card for this exercise While holding the card in sitting with your arm outstretched, turn your head left and right at your own pace while keeping the card stationary.  Do this for one minute each while you are: Identifying numbers Identifying shapes Exercise 3 (VOR cancellation) Use only one card for this exercise While holding the card in sitting with your arms outstretched, turn your body, arm, and head at the same time left and right at your own pace Do this for one minute each while you are: Identifying numbers Identifying shapes  3/11/20204 - VOR x 1 Habituation Exercises 3x daily - Sit/stands w/ 4.5lb weight at home 2 x 5 daily.  ASSESSMENT:  CLINICAL IMPRESSION: Interventions today were geared towards functional task (sit-to-stand), gaze stabilization, balance, LE strengthening and flexibility. Tolerated all activities without worsening of dizziness except when doing VOR cancellation activity as patient was unable to complete the activity due to increase in dizziness. Mild to moderate unsteadiness seen on balance exercises due to impaired proprioception. Demonstrated slight to mild levels of fatigue. Rest periods provided Provided slight amount of cueing to ensure correct execution of activity with good carry-over. To date, skilled PT is required to address the impairments and improve function.  PROGRESS NOTE 07/10/22: Patient demonstrated continued improvements in function as indicated by positive significant changes in , 5TSTS, and strength. However, patient still presents with deficits in vestibular function which greatly affects her standing balance. Patient still presents as a falls risk based on the Tinetti score. With this, skilled PT is still required to address the impairments and functional limitations listed below.   Interventions today were geared towards gaze stabilization incorporated with her standing balance. Patient tolerated gaze stabilization activities with mild dizziness and unsteadiness on the saccades, VOR 1 and cancellation exercises. Demonstrated appropriate levels of fatigue. Rest periods provided. Provided slight amount of cueing to ensure correct execution of activity with good carry-over.    OBJECTIVE IMPAIRMENTS: Abnormal gait, decreased balance, decreased coordination, decreased endurance, difficulty walking, decreased strength, impaired flexibility, and pain.   ACTIVITY LIMITATIONS: carrying, lifting, bending, standing, squatting, stairs,  transfers, continence, bathing, toileting, dressing, self feeding, hygiene/grooming, and locomotion level  PARTICIPATION LIMITATIONS: meal prep, cleaning, laundry, driving, shopping, community activity, and yard work  PERSONAL FACTORS: Fitness level, Bladder implant (will have surgery 07/26/2022), hx of B THR, lobectomy are also affecting patient's functional outcome.   REHAB POTENTIAL: Fair    CLINICAL DECISION MAKING: Evolving/moderate complexity  EVALUATION COMPLEXITY: Moderate   GOALS: Goals reviewed with patient? Yes  SHORT TERM GOALS: Target date: 07/13/2022  Pt will demonstrate indep in HEP to facilitate carry-over of skilled services and improve functional outcomes Goal status: MET  LONG TERM GOALS: Target date: 08/10/2022  Pt will be report little to no dizziness on VOR 1 and head impulse test to facilitate safety on ambulation Baseline: with dizziness Goal status: INITIAL/NEW   Pt will decrease 5TSTS by at least 5 seconds in order to demonstrate clinically significant improvement in LE strength  Baseline: 18.88 sec Goal status: REVISED  3.  Patient will demonstrate increase in Tinetti Score by 8 points in order to demonstrate clinically significant improvement in balance and decreased risk for falls  Baseline: 9 Goal status:  IN PROGRESS  4.  Pt will increase by at least 40 ft in order to demonstrate clinically significant improvement in community ambulation Baseline: 304 ft Goal status: REVISED  5.  Pt will demonstrate increase in LE strength to 4 to facilitate ease and safety in ambulation Baseline: 2+ Goal status: IN PROGRESS  6.  Pt will demonstrate improved LE coordination as manifested by performing 3/4 correct trials in heel-to-shin to facilitate ease in ambulation Baseline: impaired Goal status: INITIAL  7.  Pt will be able to walk with a quad cane for > 50 ft Baseline: walks with a rollator Goal status: REVISED   PLAN:  PT FREQUENCY: 3x/week  PT DURATION: 4 weeks  PLANNED INTERVENTIONS: Therapeutic exercises, Therapeutic activity, Neuromuscular re-education, Balance training, Gait training, Patient/Family education, Self Care, and Stair training  PLAN FOR NEXT SESSION: Continue POC and may progress as tolerated with emphasis on balance, LE strengthening, flexibility, and vestibular function.   3:26 PM, 07/19/22 Tish Frederickson. Kymberlie Brazeau, PT, DPT, OCS Board-Certified Clinical Specialist in Orthopedic PT PT Compact Privilege # ([place]): [id number] [phone]

## 2022-07-20 ENCOUNTER — Ambulatory Visit (HOSPITAL_COMMUNITY): Payer: Medicare Other

## 2022-07-20 DIAGNOSIS — R262 Difficulty in walking, not elsewhere classified: Secondary | ICD-10-CM | POA: Diagnosis not present

## 2022-07-20 NOTE — Therapy (Addendum)
OUTPATIENT PHYSICAL THERAPY LOWER EXTREMITY TREATMENT NOTE   Patient Name: [name] MRN: [id number] DOB:01/28/1966, 57 y.o., female Today's Date: 07/20/2022  END OF SESSION:  PT End of Session - 07/20/22 1357     Visit Number 13    Number of Visits 20    Date for PT Re-Evaluation 08/10/22    Authorization Type BCBS Medicare, Medicaid of [place]    Progress Note Due on Visit --    PT Start Time 1350    PT Stop Time 1435    PT Time Calculation (min) 45 min    Equipment Utilized During Treatment Gait belt;Oxygen    Activity Tolerance Patient tolerated treatment well    Behavior During Therapy WFL for tasks assessed/performed            Past Surgical History:  Procedure Laterality Date   ABDOMINAL HYSTERECTOMY     with bladder suspension   CERVICAL FUSION     CHOLECYSTECTOMY     COLONOSCOPY N/A 07/30/2013   Procedure: COLONOSCOPY;  Surgeon: Malissa Hippo, MD;  Location: [facility];  Service: Endoscopy;  Laterality: N/A;  930   EXAM UNDER ANESTHESIA WITH MANIPULATION OF SHOULDER Right 10/11/2016   Procedure: EXAM UNDER ANESTHESIA WITH MANIPULATION OF SHOULDER;  Surgeon: Vickki Hearing, MD;  Location: [facility];  Service: Orthopedics;  Laterality: Right;   HERNIA REPAIR     INCISIONAL HERNIA REPAIR N/A 07/30/2016   Procedure: HERNIA REPAIR INCISIONAL WITH MESH;  Surgeon: [name], MD;  Location: [facility];  Service: General;  Laterality: N/A;   INTERSTIM IMPLANT PLACEMENT  02/02/2021   Procedure: Leane Platt IMPLANT FIRST STAGE LEFT SACRUM ;  Surgeon: Malen Gauze, MD;  Location: [facility];  Service: Urology;;   Leane Platt IMPLANT PLACEMENT N/A 02/23/2021   Procedure: Leane Platt IMPLANT SECOND STAGE;  Surgeon: Malen Gauze, MD;  Location: [facility];  Service: Urology;  Laterality: N/A;  Rep coming, time needs to stay at 2:00   JOINT REPLACEMENT     LOBECTOMY     right lung    POSTERIOR CERVICAL LAMINECTOMY Right 04/05/2017   Procedure: Right C6-7 Posterior  cervical laminectomy;  Surgeon: Donalee Citrin, MD;  [facility];  Service: Neurosurgery;  Laterality: Right;  Right C6-7 Posterior cervical laminectomy   SHOULDER ARTHROSCOPY WITH ROTATOR CUFF REPAIR Right 10/11/2016   Procedure: SHOULDER ARTHROSCOPY;  Surgeon: Vickki Hearing, MD;  Location: [facility];  Service: Orthopedics;  Laterality: Right;   TOTAL HIP ARTHROPLASTY Right 04/15/2020   Procedure: RIGHT TOTAL HIP ARTHROPLASTY ANTERIOR APPROACH;  Surgeon: Kathryne Hitch, MD;  Location: [facility];  Service: Orthopedics;  Laterality: Right;   TOTAL HIP ARTHROPLASTY Left 07/22/2020   Procedure: LEFT  HIP ARTHROPLASTY ANTERIOR APPROACH;  Surgeon: Kathryne Hitch, MD;  Location: [facility];  Service: Orthopedics;  Laterality: Left;  RNFA   TUBAL LIGATION     Patient Active Problem List   Diagnosis Date Noted   Severe persistent asthma without complication 06/06/2022   Gait abnormality 05/28/2022   Adrenal insufficiency 03/30/2022   Endocrine disorder, unspecified 07/04/2021   Prediabetes 07/04/2021   Hypothyroidism 07/04/2021   Former smoker 06/29/2021   Seasonal and perennial allergic rhinitis 06/19/2021   Oxygen dependent 06/19/2021   Steroid dependent 06/19/2021   Immunosuppressed status 06/19/2021   Radiculopathy, lumbar region 12/08/2020   Status post left hip replacement 07/22/2020   Status post right hip replacement 05/02/2020   Status post total replacement of right hip 04/15/2020   Avascular necrosis  of bone of left hip 02/10/2020   Avascular necrosis of bone of right hip 02/10/2020   Allergic rhinitis 05/21/2019   Asthma-COPD overlap syndrome 04/30/2019   Chronic respiratory failure with hypoxia 04/30/2019   Spinal stenosis of cervical region 04/05/2017   Partial tear of right subscapularis tendon    Incisional hernia, without obstruction or gangrene    Urinary frequency 03/21/2012   Migraine equivalent syndrome 03/21/2012   Insomnia 03/21/2012    PCP: Benita Stabile, MD  REFERRING PROVIDER: Levert Feinstein, MD  REFERRING DIAG: R26.9 (ICD-10-CM) - Gait abnormality  THERAPY DIAG:  Difficulty in walking, not elsewhere classified  Rationale for Evaluation and Treatment: Rehabilitation  ONSET DATE: April 12, 2022  SUBJECTIVE:   SUBJECTIVE STATEMENT: Still woke up this morning feeling disoriented. Current BP = 102/61 mmHg. Patient states that she took her sugar levels, BP (98/52 mmHg) and O2 levels this morning and states that there were all within normal limits. Requested to not perform the VOR cancellation and VOR 1 activity today as she is still disoriented.  PROGRESS NOTE 07/10/22: Patient states that she went to her neurologist and was diagnosed with vestibular neuritis. Patient says she will be referred to a neurologist. Patient states that she's still dizzy ("wavy" sensation) but not as bad as she used to be. Patient states that her balance is still off but has improved a little since she started PT. Denies any recent falls. Patient thinks she has improved around 25%. With this, patient wishes to continue with PT.   PERTINENT HISTORY: Bladder implant (will have surgery 07/26/2022), hx of B THR, lobectomy PAIN:  Are you having pain? No  PRECAUTIONS: Fall and Other: uses portable O2  WEIGHT BEARING RESTRICTIONS: No  FALLS:  Has patient fallen in last 6 months? Yes. Number of falls 7  LIVING ENVIRONMENT: Lives with: lives with their spouse Lives in: House/apartment Stairs: Yes: External: 1 steps; none Has following equipment at home: Single point cane, Walker - 2 wheeled, Environmental consultant - 4 wheeled, shower chair, bed side commode, and oxygen  OCCUPATION: on disability  PLOF: Independent with household mobility with device and Requires assistive device for independence  PATIENT GOALS: "to be able to walk straight by myself"  NEXT MD VISIT: Aug 30, 2022  OBJECTIVE:   DIAGNOSTIC FINDINGS:  MRI HEAD WITHOUT CONTRAST 06/08/22    TECHNIQUE: Multiplanar, multiecho pulse sequences of the brain and surrounding structures were obtained without intravenous contrast.   COMPARISON:  Head CT 05/22/2022. Brain MRI 05/22/2011.   FINDINGS: Brain:   No age advanced or lobar predominant parenchymal atrophy.   Multifocal T2 FLAIR hyperintense signal abnormality within the cerebral white matter, overall mild but greater than expected for age.   There is no acute infarct.   No evidence of an intracranial mass.   No chronic intracranial blood products.   No extra-axial fluid collection.   No midline shift.   Vascular: Maintained flow voids within the proximal large arterial vessels.   Skull and upper cervical spine: No focal suspicious calvarial marrow lesion. Cervical spine findings separately reported on same day cervical spine MRI.   Sinuses/Orbits: No mass or acute finding within the imaged orbits. Prior bilateral ocular lens replacement. Trace mucosal thickening within left ethmoid air cells.   Other: Small-volume fluid within the bilateral mastoid air cells.   IMPRESSION: 1.  No evidence of an acute intracranial abnormality. 2. Multifocal T2 FLAIR hyperintense signal abnormality within the cerebral white matter, overall mild but greater than expected  for age. These signal changes have slightly progressed from the prior brain MRI of 05/22/2011. Findings are nonspecific and differential considerations include chronic small vessel ischemic disease, sequelae of chronic migraine headaches, sequelae of a prior infectious/inflammatory process or sequelae of a demyelinating process (such as multiple sclerosis), among others. No lesions specifically suggest demyelinating disease. 3. Small-volume fluid within the bilateral mastoid air cells.  MRI CERVICAL SPINE WITHOUT CONTRAST 06/08/22   TECHNIQUE: Multiplanar, multisequence MR imaging of the cervical spine was performed. No intravenous contrast was  administered.   COMPARISON:  Radiographs of the cervical spine 08/06/2017. Cervical spine MRI 02/24/2017.   FINDINGS: Alignment: Straightening of the expected cervical lordosis. 3 mm C7-T1 grade 1 anterolisthesis.   Vertebrae: Vertebral body height is maintained. Susceptibility artifact arising from ACDF hardware at the C4-C7 levels. No appreciable significant marrow edema or focal suspicious osseous lesion.   Cord: No signal abnormality identified within the cervical spinal cord.   Posterior Fossa, vertebral arteries, paraspinal tissues: Posterior fossa assessed on same-day brain MRI. Flow voids preserved within the imaged cervical vertebral arteries. No paraspinal mass or collection.   Disc levels:   Unless otherwise stated, the level by level findings below have not significantly changed from the prior MRI of 03/06/2017.   Disc degeneration at the non-operative levels, greatest at C3-C4 and C7-T1 (progressed at these levels).   C2-C3: Slight disc bulge. No significant spinal canal or foraminal stenosis.   C3-C4: Disc bulge. New superimposed small central disc protrusion (at site of posterior annular fissure). Uncovertebral hypertrophy on the right, new from the prior MRI. Facet arthrosis. Mild relative spinal canal narrowing (without spinal cord mass effect), new from the prior MRI. Moderate right neural foraminal narrowing, also new from the prior MRI.   C4-C5: Prior ACDF. No significant spinal canal or foraminal stenosis.   C5-C6: Prior ACDF. Uncovertebral hypertrophy on the right. No significant spinal canal stenosis. Mild right neural foraminal narrowing.   C6-C7: Prior ACDF. Uncovertebral vertebrae on the right. No significant spinal canal stenosis. Moderate right neural foraminal narrowing.   C7-T1: 3 mm grade 1 anterolisthesis. Mild disc uncovering. Facet arthrosis. No significant spinal canal stenosis. Mild-to-moderate bilateral neural foraminal  narrowing.   IMPRESSION: Prior C4-C7 ACDF. No significant spinal canal stenosis at these levels. As before, uncovertebral hypertrophy results in neural foraminal narrowing on the right at C5-C6 (mild), and on the right at C6-C7 (moderate).   Cervical spondylosis at the remaining levels, as outlined and with findings most notably as follows.   At C3-C4, there is a disc bulge. New superimposed small central disc protrusion (at site of posterior annular fissure). Uncovertebral hypertrophy on the right, new from the prior MRI. Facet arthrosis. Mild spinal canal narrowing, and moderate right neural foraminal narrowing, new from the prior MRI.   At C7-T1, there is 3 mm grade 1 anterolisthesis. Mild disc uncovering. Facet arthrosis. No significant spinal canal stenosis. Mild-to-moderate bilateral neural foraminal narrowing. Findings at this level have not significantly changed.  COGNITION: Overall cognitive status: Within functional limits for tasks assessed     SENSATION: Not tested  MUSCLE LENGTH: (not tested on 07/10/22) Mild restriction on B hamstrings Moderate restriction on B gastrocsoleus  POSTURE: rounded shoulders and forward head   LOWER EXTREMITY MMT  MMT Right eval Left eval Right 07/10/22 Left 07/10/22  Hip flexion Hip extension 2+ (bridging) 2+  (bridging) 3+  (bridging) 3+ (bridging)  Hip abduction Hip adduction  Hip internal rotation      Hip external rotation      Knee flexion 4- 4- 4 4  Knee extension 4- 3+ 4 4  Ankle dorsiflexion 3+ 4- 4- 4-  Ankle plantarflexion 3+ 3+ 3+ 3+  Ankle inversion      Ankle eversion       (Blank rows = not tested)  LOWER EXTREMITY ROM  Active ROM Right eval Left eval  Hip flexion [facility]  Hip extension    Hip abduction [facility]  Hip adduction [facility]  Hip internal rotation    Hip external rotation    Knee flexion [facility] WFL  Knee extension [facility]  Ankle dorsiflexion [facility] WFL  Ankle  plantarflexion [facility] WFL  Ankle inversion    Ankle eversion     (Blank rows = not tested)  FUNCTIONAL TESTS:  5 times sit to stand: 18.88 sec (little assist from rollator) from 26.97 sec (has to hold on to rollator) 2 minute walk test: 304 ft (with rollator) from 224 ft (with a rollator) Tinetti POMA : 14 from 9 (balance + gait)  GAIT: Distance walked: 304 ft Assistive device utilized:  rollator Level of assistance: Modified independence Comments: foot clearance on B more evident, normal step length on B  VESTIBULAR ASSESSMENT  06/21/22 07/10/22  Head impulse test positive with corrective saccades, dizziness reported positive with corrective saccades, dizziness reported  VOR 1 corrective saccades, dizziness reported corrective saccades, dizziness reported  Optokinetic reflex (R/L)  impaired, dizziness reported  impaired, dizziness reported    TODAY'S TREATMENT:                                                                                                                              DATE:  07/20/2022 Cervical neck AROM flex/ext, rot x 10 Gastrocnemius slant board stretch x 30" x 3 Sit-to-stand, (emphasis on scooting, heel placement back, and trunk momentum), CGA x 10 Standing, firm surface, normal BOS:  Horizontal saccades, while reading words x 1'  Vertical saccades, while reading numbers x 1'  Horizontal smooth pursuit, identifying animal silhouettes x 1'  Vertical smooth pursuit, identifying/counting colors/shapes x 1'  Heel/toe raises, x 2 lbs no UE support x 10 x 2  Tandem stance x 30" x 2, eyes open, light UE support  Rhytmic stabilization x ant/post x 3" x 10 x 2, no UE support  Rocking front/back, L arm swing, R LE forward x 30", R UE support  Mini squats 3" x 10 x 2, no UE support  TKE, GTB x 10 x 2 x 3"  Hip vectors x 10 x 2, GTB  07/19/2022 Cervical neck AROM flex/ext, rot x 10 Gastrocnemius slant board stretch x 30" x 3 Sit-to-stand, (emphasis on scooting, heel  placement back, and trunk momentum), CGA x 10 Standing, firm surface, normal BOS:  Horizontal saccades, while reading words x 1'  Vertical saccades, while reading numbers x 1'  Horizontal smooth  pursuit, identifying animal silhouettes x 1'  Vertical smooth pursuit, identifying/counting colors/shapes x 1'  VOR 1, own pace, identifying colors x 1'  VOR cancellation, reading numbers x 30"  Heel/toe raises, no UE support x 10 x 2  Tandem stance x 30" x 2, eyes open, light UE support  Rhytmic stabilization x ant/post x 3" x 10 x 2, no UE support  Rocking front/back, L arm swing, R LE forward x 30", R UE support  Mini squats 3" x 10 x 2, no UE support  TKE, GTB x 10 x 2 x 3"  Hip vectors x 10 x 2, GTB  07/17/22 Standing HR/TR 2 x 10 Step up 4 inch 1 x 10 bilateral UE support, 1 x 10 unilateral UE support bilateral  Rhytmic stabilization x ant/post and lateral x 3" x 10 x 1, no UE support Lateral stepping 4 x 8 feet bilateral CGA Hip vectors GTB at calf 2 x 5 bilateral  Sit-to-stand, seat slightly elevated (emphasis on scooting, heel placement back, and trunk momentum), CGA x 10; 5 x with emphasis on corkscrew of feet  07/13/2022 Cervical neck AROM flex/ext, rot x 10 Gastrocnemius slant board stretch x 30" x 3 Sit-to-stand, seat slightly elevated (emphasis on scooting, heel placement back, and trunk momentum), CGA x 10 Standing, firm surface, normal BOS:  Horizontal saccades, while reading words x 1'  Vertical saccades, while reading numbers x 1'  Horizontal smooth pursuit, identifying animal silhouettes x 1'  Vertical smooth pursuit, identifying/counting colors/shapes x 1'  VOR 1, own pace, identifying colors x 1'  VOR cancellation, identifying shapes x 30"  Heel/toe raises, no UE support x 10 x 2  Tandem stance x 30" x 2, eyes open, light UE support Static standing, eyes open x 30" x 2, no UE support  Rhytmic stabilization x ant/post x 3" x 10 x 2, no UE support  Rocking front/back, L  arm swing, R LE forward x 30", R UE support  Mini squats 3" x 10 x 2, no UE support  TKE, GTB x 10 x 2 x 3"  Hip vectors x 10 x 2, RTB  07/12/2022 Cervical neck AROM flex/ext, rot x 10 Gastrocnemius slant board stretch x 30" x 3 Sit-to-stand, seat slightly elevated (emphasis on scooting, heel placement back, and trunk momentum), CGA Standing, firm surface, normal BOS:  Horizontal saccades, while reading words x 1'  Vertical saccades, while reading words x 1'  Horizontal smooth pursuit, identifying animal silhouettes x 1'  Vertical smooth pursuit, identifying colors x 1'  VOR 1, own pace, identifying numbers x 1'  VOR cancellation, identifying shapes x 30"  Heel/toe raises with 1 UE support x 10 x 2  Tandem stance x 30" x 2, eyes open, light UE support Static standing, eyes open x 30" x 2 Static standing with head turns x 30" x 2  Rhytmic stabilization x ant/post x 3" x 10 x 2  Mini squats 3" x 10 x 2, no UE support  TKE, RTB x 10 x 2 x 3"  Hip ext x 10 x 2, RTB  07/10/2022 Progress note ( , Tinetti, vestibular exam, ROM, MMT, 5TSTS) Cervical neck AROM flex/ext, rot x 10 Standing, firm surface, normal BOS:  Horizontal saccades, while reading words x 1'  Vertical saccades, while identifying numbers x 1'  Horizontal smooth pursuit, identifying color/shapes x 1'  Vertical smooth pursuit, identifying animal silhouettes x 1'  VOR 1, own pace, identifying numbers x 1'  07/05/2022 Seated:  Cervical neck AROM flex/ext, rot x 10  Horizontal saccades, while reading words x 1'  Vertical saccades, while identifying numbers x 1'  Horizontal smooth pursuit, identifying color/shapes x 1'  Vertical smooth pursuit, identifying animal silhouettes x 1'  VOR 1, 70 bpm, identifying numbers x 1'  VOR cancellation, identifying colors x 1' Gastrocnemius slant board stretch x 30" x 3 Standing on firm surface, normal BOS:  Heel/toe raises with 1 UE support x 10 x 2  Tandem stance x 30" x 2,  eyes open, light UE support Static standing, eyes open x 30" x 2 Static standing with head turns x 30" x 2  Rhytmic stabilization x ant/post x 3" x 10 x 2  Mini squats 3" x 10 x 2, no UE support  TKE, RTB x 10 x 2 x 3"  Hip ext x 10 x 2  07/02/22 Sitting: AROM of the cervical spine flexion/extension, rotation x 10 Gastroc stretch with strap 5 x 20" each  Standing: Heel/toe raises 2 x 10 Hip abduction and extension 2 x 10 each Tandem stance 2 x 30" fingertip support Small base of support standing 3 x 30" no UE support Shoulder width stance with light support with head turns and nods x 10 each  06/28/2022 Seated:  Cervical neck AROM flex/ext, rot x 10  Gastrocnemius strap stretch x 30" x 3  Horizontal saccades, while identifying numbers x 1'  Vertical saccades, while reading words x 1'  Horizontal smooth pursuit, identifying animal silhouettes x 1'  Vertical smooth pursuit, identifying colors/shapes x 1'  VOR 1, 70 bpm, identifying numbers x 1'  VOR cancellation, reading words x 1' Gastrocnemius slant board stretch x 30" x 3 Standing on firm surface, normal BOS:  Heel/toe raises with 1 UE support x 10 x 2  Tandem stance x 30" x 2, eyes open, light UE support Static standing, eyes open x 30"  Static standing with head turns x 30"  Rhytmic stabilization x ant/post x 3" x 10 x 2  Mini squats 3" x 10 x 2, no UE support  TKE, RTB x 10 x 2 x 3"  Hip ext x 10 x 2  06/27/2022 Seated:  Cervical neck AROM flex/ext, rot x 10  Gastrocnemius strap stretch x 30" x 3  Horizontal saccades, while identifying numbers x 1'  Vertical saccades, while reading words x 1'  Horizontal smooth pursuit, identifying animal silhouettes x 1'  Vertical smooth pursuit, identifying colors/shapes x 1'  VOR 1, 70 bpm, identifying numbers x 1'  VOR cancellation, reading words x 1' Standing on firm surface, normal BOS:  Heel/toe raises with 1 UE support x 10 x 2  Tandem stance x 30" x 3, eyes open, light  UE support Static standing, eyes open x 30"  Static standing with head turns x 30"  Rhytmic stabilization x ant/post x 3" x 10  06/21/2022 Seated:  Cervical neck AROM flex/ext, rot x 10  Horizontal saccades, while identifying numbers x 1'  Vertical saccades, while reading words x 1'  Horizontal smooth pursuit, identifying animal silhouettes x 1'  Vertical smooth pursuit, identifying colors/shapes x 1'  VOR 1, 60 bpm, identifying numbers x 1'  VOR cancellation, identifying colors x 1'  06/18/2022 -Education for pt's explanation of visual disturbances during walking; Attempted VOR assessment but limited Head ROM due to previous surgieies.  -Habituation exercises daily at home VOR x 1 3x daily. - 1 x 5  elevated dead lift with weighed balls with 10in  box  - 2x 5Weighted sit/stands from elevated mat with yellow weighted ball.  2  06/15/22 Evaluation and patient education done    PATIENT EDUCATION:  Education details: Updated HEP. Instructed patient to perform standing exercises on with a stable support (e.g. kitchen counter) and have her husband next to her. Person educated: Patient Education method: Explanation Education comprehension: verbalized understanding  HOME EXERCISE PROGRAM: Access Code: [email or web address] URL: [email or web address] 07/17/22 - Sit to Stand with Arms Crossed  - 1 x daily - 7 x weekly - 1-2 sets - 10 reps  07/13/22: - Standing 3-Way Leg Reach with Resistance at Ankles and Counter Support  - 1 x daily - 7 x weekly - 2 sets - 10 reps  07/02/22 hip abduction, small bos standing and regular standing head turns and nods  06/28/2022 - Mini Squat with Counter Support  - 1-2 x daily - 5-7 x weekly - 2 sets - 10 reps - 3 hold - Standing Hip Extension with Counter Support  - 1-2 x daily - 5-7 x weekly - 2 sets - 10 reps  Date: 06/27/2022 Prepared by: Krystal Clark  Exercises - Seated Calf Stretch with Strap  - 1-2 x daily - 5-7 x weekly - 3 reps - 30  hold - Standing Tandem Balance with Counter Support  - 1-2 x daily - 5-7 x weekly - 30 hold - Heel Toe Raises with Counter Support  - 1-2 x daily - 5-7 x weekly - 2 sets - 10 reps  06/21/2022 GAZE STABILIZATION EXERCISES (2-3x/day, 5-7 days/week, seated) These exercises are designed to improve your eyes' ability to track objects without getting dizzy. When performing these exercises, make sure you are facing a blank wall (like a white wall) to avoid distraction. You can wear your glasses/contact lenses if you must.   Before anything, warm-up your neck muscles by bending it forward and back 10 times, side-to-side 10 times, and slowly turn your head left and right 10 time Exercise 1 (Horizontal Saccades) Use two cards for this exercise While holding the cards on each hand side by side in sitting with arms outstretched, look at the first item on the other card then look at the next item on the second card then alternate your gaze sequentially. Do NOT move your cards nor the your head. Do the following for one minute each Identifying numbers Identifying shapes Exercise 2 (Par[employer] x1) Use only one card for this exercise While holding the card in sitting with your arm outstretched, turn your head left and right at your own pace while keeping the card stationary.  Do this for one minute each while you are: Identifying numbers Identifying shapes Exercise 3 (VOR cancellation) Use only one card for this exercise While holding the card in sitting with your arms outstretched, turn your body, arm, and head at the same time left and right at your own pace Do this for one minute each while you are: Identifying numbers Identifying shapes  3/11/20204 - VOR x 1 Habituation Exercises 3x daily - Sit/stands w/ 4.5lb weight at home 2 x 5 daily.  ASSESSMENT:  CLINICAL IMPRESSION: Interventions today were geared towards functional task (sit-to-stand), gaze stabilization, balance, LE strengthening and  flexibility. Tolerated all activities without worsening of dizziness. Still demonstrated mild to moderate unsteadiness on balance exercises due to impaired proprioception. Demonstrated slight to mild levels of fatigue. Rest periods provided Provided slight amount of cueing to ensure correct execution of activity with good carry-over. Advised patient to discontinue or  reduce the frequency of the gazed stabilization exercises at home when she's having a "bad" day (worsening dizziness/disorientation). Patient gave excellent verbal understanding. To date, skilled PT is required to address the impairments and improve function.  PROGRESS NOTE 07/10/22: Patient demonstrated continued improvements in function as indicated by positive significant changes in , 5TSTS, and strength. However, patient still presents with deficits in vestibular function which greatly affects her standing balance. Patient still presents as a falls risk based on the Tinetti score. With this, skilled PT is still required to address the impairments and functional limitations listed below.  Interventions today were geared towards gaze stabilization incorporated with her standing balance. Patient tolerated gaze stabilization activities with mild dizziness and unsteadiness on the saccades, VOR 1 and cancellation exercises. Demonstrated appropriate levels of fatigue. Rest periods provided. Provided slight amount of cueing to ensure correct execution of activity with good carry-over.    OBJECTIVE IMPAIRMENTS: Abnormal gait, decreased balance, decreased coordination, decreased endurance, difficulty walking, decreased strength, impaired flexibility, and pain.   ACTIVITY LIMITATIONS: carrying, lifting, bending, standing, squatting, stairs, transfers, continence, bathing, toileting, dressing, self feeding, hygiene/grooming, and locomotion level  PARTICIPATION LIMITATIONS: meal prep, cleaning, laundry, driving, shopping, community activity, and yard  work  PERSONAL FACTORS: Fitness level, Bladder implant (will have surgery 07/26/2022), hx of B THR, lobectomy are also affecting patient's functional outcome.   REHAB POTENTIAL: Fair    CLINICAL DECISION MAKING: Evolving/moderate complexity  EVALUATION COMPLEXITY: Moderate   GOALS: Goals reviewed with patient? Yes  SHORT TERM GOALS: Target date: 07/13/2022  Pt will demonstrate indep in HEP to facilitate carry-over of skilled services and improve functional outcomes Goal status: MET  LONG TERM GOALS: Target date: 08/10/2022  Pt will be report little to no dizziness on VOR 1 and head impulse test to facilitate safety on ambulation Baseline: with dizziness Goal status: INITIAL/NEW   Pt will decrease 5TSTS by at least 5 seconds in order to demonstrate clinically significant improvement in LE strength  Baseline: 18.88 sec Goal status: REVISED  3.  Patient will demonstrate increase in Tinetti Score by 8 points in order to demonstrate clinically significant improvement in balance and decreased risk for falls  Baseline: 9 Goal status: IN PROGRESS  4.  Pt will increase by at least 40 ft in order to demonstrate clinically significant improvement in community ambulation Baseline: 304 ft Goal status: REVISED  5.  Pt will demonstrate increase in LE strength to 4 to facilitate ease and safety in ambulation Baseline: 2+ Goal status: IN PROGRESS  6.  Pt will demonstrate improved LE coordination as manifested by performing 3/4 correct trials in heel-to-shin to facilitate ease in ambulation Baseline: impaired Goal status: INITIAL  7.  Pt will be able to walk with a quad cane for > 50 ft Baseline: walks with a rollator Goal status: REVISED   PLAN:  PT FREQUENCY: 3x/week  PT DURATION: 4 weeks  PLANNED INTERVENTIONS: Therapeutic exercises, Therapeutic activity, Neuromuscular re-education, Balance training, Gait training, Patient/Family education, Self Care, and Stair  training  PLAN FOR NEXT SESSION: Continue POC and may progress as tolerated with emphasis on balance, LE strengthening, flexibility, and vestibular function.   2:56 PM, 07/20/22 Tish Frederickson. Calyse Murcia, PT, DPT, OCS Board-Certified Clinical Specialist in Orthopedic PT PT Compact Privilege # ([place]): [phone] [phone]

## 2022-07-23 ENCOUNTER — Encounter (HOSPITAL_COMMUNITY): Payer: Self-pay | Admitting: Physical Therapy

## 2022-07-23 ENCOUNTER — Ambulatory Visit (HOSPITAL_COMMUNITY): Payer: Medicare Other | Admitting: Physical Therapy

## 2022-07-23 DIAGNOSIS — R262 Difficulty in walking, not elsewhere classified: Secondary | ICD-10-CM | POA: Diagnosis not present

## 2022-07-23 DIAGNOSIS — M25551 Pain in right hip: Secondary | ICD-10-CM

## 2022-07-23 NOTE — Therapy (Signed)
OUTPATIENT PHYSICAL THERAPY LOWER EXTREMITY TREATMENT NOTE   Patient Name: [name] MRN: [id number] DOB:08-Feb-1966, 57 y.o., female Today's Date: 07/23/2022  END OF SESSION:  PT End of Session - 07/23/22 1259     Visit Number 14    Number of Visits 20    Date for PT Re-Evaluation 08/10/22    Authorization Type BCBS Medicare, Medicaid of [place]    PT Start Time 1300    PT Stop Time 1338    PT Time Calculation (min) 38 min    Equipment Utilized During Treatment Gait belt;Oxygen    Activity Tolerance Patient tolerated treatment well    Behavior During Therapy WFL for tasks assessed/performed            Past Surgical History:  Procedure Laterality Date   ABDOMINAL HYSTERECTOMY     with bladder suspension   CERVICAL FUSION     CHOLECYSTECTOMY     COLONOSCOPY N/A 07/30/2013   Procedure: COLONOSCOPY;  Surgeon: Malissa Hippo, MD;  Location: [facility];  Service: Endoscopy;  Laterality: N/A;  930   EXAM UNDER ANESTHESIA WITH MANIPULATION OF SHOULDER Right 10/11/2016   Procedure: EXAM UNDER ANESTHESIA WITH MANIPULATION OF SHOULDER;  Surgeon: Vickki Hearing, MD;  Location: [facility];  Service: Orthopedics;  Laterality: Right;   HERNIA REPAIR     INCISIONAL HERNIA REPAIR N/A 07/30/2016   Procedure: HERNIA REPAIR INCISIONAL WITH MESH;  Surgeon: [name], MD;  Location: [facility];  Service: General;  Laterality: N/A;   INTERSTIM IMPLANT PLACEMENT  02/02/2021   Procedure: Leane Platt IMPLANT FIRST STAGE LEFT SACRUM ;  Surgeon: Malen Gauze, MD;  Location: [facility];  Service: Urology;;   Leane Platt IMPLANT PLACEMENT N/A 02/23/2021   Procedure: Leane Platt IMPLANT SECOND STAGE;  Surgeon: Malen Gauze, MD;  Location: [facility];  Service: Urology;  Laterality: N/A;  Rep coming, time needs to stay at 2:00   JOINT REPLACEMENT     LOBECTOMY     right lung    POSTERIOR CERVICAL LAMINECTOMY Right 04/05/2017   Procedure: Right C6-7 Posterior cervical laminectomy;  Surgeon:  Donalee Citrin, MD;  [facility] OR;  Service: Neurosurgery;  Laterality: Right;  Right C6-7 Posterior cervical laminectomy   SHOULDER ARTHROSCOPY WITH ROTATOR CUFF REPAIR Right 10/11/2016   Procedure: SHOULDER ARTHROSCOPY;  Surgeon: Vickki Hearing, MD;  Location: [facility];  Service: Orthopedics;  Laterality: Right;   TOTAL HIP ARTHROPLASTY Right 04/15/2020   Procedure: RIGHT TOTAL HIP ARTHROPLASTY ANTERIOR APPROACH;  Surgeon: Kathryne Hitch, MD;  Location: [facility];  Service: Orthopedics;  Laterality: Right;   TOTAL HIP ARTHROPLASTY Left 07/22/2020   Procedure: LEFT  HIP ARTHROPLASTY ANTERIOR APPROACH;  Surgeon: Kathryne Hitch, MD;  Location: [facility];  Service: Orthopedics;  Laterality: Left;  RNFA   TUBAL LIGATION     Patient Active Problem List   Diagnosis Date Noted   Severe persistent asthma without complication 06/06/2022   Gait abnormality 05/28/2022   Adrenal insufficiency 03/30/2022   Endocrine disorder, unspecified 07/04/2021   Prediabetes 07/04/2021   Hypothyroidism 07/04/2021   Former smoker 06/29/2021   Seasonal and perennial allergic rhinitis 06/19/2021   Oxygen dependent 06/19/2021   Steroid dependent 06/19/2021   Immunosuppressed status 06/19/2021   Radiculopathy, lumbar region 12/08/2020   Status post left hip replacement 07/22/2020   Status post right hip replacement 05/02/2020   Status post total replacement of right hip 04/15/2020   Avascular necrosis of bone of left hip 02/10/2020   Avascular  necrosis of bone of right hip 02/10/2020   Allergic rhinitis 05/21/2019   Asthma-COPD overlap syndrome 04/30/2019   Chronic respiratory failure with hypoxia 04/30/2019   Spinal stenosis of cervical region 04/05/2017   Partial tear of right subscapularis tendon    Incisional hernia, without obstruction or gangrene    Urinary frequency 03/21/2012   Migraine equivalent syndrome 03/21/2012   Insomnia 03/21/2012    PCP: Benita Stabile, MD  REFERRING PROVIDER:  Levert Feinstein, MD  REFERRING DIAG: R26.9 (ICD-10-CM) - Gait abnormality  THERAPY DIAG:  Difficulty in walking, not elsewhere classified  Pain in right hip  Rationale for Evaluation and Treatment: Rehabilitation  ONSET DATE: April 12, 2022  SUBJECTIVE:   SUBJECTIVE STATEMENT: Same with balance and dizziness, still feeling disoriented. Worse with movement especially head movement. Disoriented feeling since last Thursday. Thinks it may be due to breathing and anxiety.   PROGRESS NOTE 07/10/22: Patient states that she went to her neurologist and was diagnosed with vestibular neuritis. Patient says she will be referred to a neurologist. Patient states that she's still dizzy ("wavy" sensation) but not as bad as she used to be. Patient states that her balance is still off but has improved a little since she started PT. Denies any recent falls. Patient thinks she has improved around 25%. With this, patient wishes to continue with PT.   PERTINENT HISTORY: Bladder implant (will have surgery 07/26/2022), hx of B THR, lobectomy PAIN:  Are you having pain? No  PRECAUTIONS: Fall and Other: uses portable O2  WEIGHT BEARING RESTRICTIONS: No  FALLS:  Has patient fallen in last 6 months? Yes. Number of falls 7  LIVING ENVIRONMENT: Lives with: lives with their spouse Lives in: House/apartment Stairs: Yes: External: 1 steps; none Has following equipment at home: Single point cane, Walker - 2 wheeled, Environmental consultant - 4 wheeled, shower chair, bed side commode, and oxygen  OCCUPATION: on disability  PLOF: Independent with household mobility with device and Requires assistive device for independence  PATIENT GOALS: "to be able to walk straight by myself"  NEXT MD VISIT: Aug 30, 2022  OBJECTIVE:   DIAGNOSTIC FINDINGS:  MRI HEAD WITHOUT CONTRAST 06/08/22   TECHNIQUE: Multiplanar, multiecho pulse sequences of the brain and surrounding structures were obtained without intravenous contrast.    COMPARISON:  Head CT 05/22/2022. Brain MRI 05/22/2011.   FINDINGS: Brain:   No age advanced or lobar predominant parenchymal atrophy.   Multifocal T2 FLAIR hyperintense signal abnormality within the cerebral white matter, overall mild but greater than expected for age.   There is no acute infarct.   No evidence of an intracranial mass.   No chronic intracranial blood products.   No extra-axial fluid collection.   No midline shift.   Vascular: Maintained flow voids within the proximal large arterial vessels.   Skull and upper cervical spine: No focal suspicious calvarial marrow lesion. Cervical spine findings separately reported on same day cervical spine MRI.   Sinuses/Orbits: No mass or acute finding within the imaged orbits. Prior bilateral ocular lens replacement. Trace mucosal thickening within left ethmoid air cells.   Other: Small-volume fluid within the bilateral mastoid air cells.   IMPRESSION: 1.  No evidence of an acute intracranial abnormality. 2. Multifocal T2 FLAIR hyperintense signal abnormality within the cerebral white matter, overall mild but greater than expected for age. These signal changes have slightly progressed from the prior brain MRI of 05/22/2011. Findings are nonspecific and differential considerations include chronic small vessel ischemic disease, sequelae of  chronic migraine headaches, sequelae of a prior infectious/inflammatory process or sequelae of a demyelinating process (such as multiple sclerosis), among others. No lesions specifically suggest demyelinating disease. 3. Small-volume fluid within the bilateral mastoid air cells.  MRI CERVICAL SPINE WITHOUT CONTRAST 06/08/22   TECHNIQUE: Multiplanar, multisequence MR imaging of the cervical spine was performed. No intravenous contrast was administered.   COMPARISON:  Radiographs of the cervical spine 08/06/2017. Cervical spine MRI 02/24/2017.   FINDINGS: Alignment:  Straightening of the expected cervical lordosis. 3 mm C7-T1 grade 1 anterolisthesis.   Vertebrae: Vertebral body height is maintained. Susceptibility artifact arising from ACDF hardware at the C4-C7 levels. No appreciable significant marrow edema or focal suspicious osseous lesion.   Cord: No signal abnormality identified within the cervical spinal cord.   Posterior Fossa, vertebral arteries, paraspinal tissues: Posterior fossa assessed on same-day brain MRI. Flow voids preserved within the imaged cervical vertebral arteries. No paraspinal mass or collection.   Disc levels:   Unless otherwise stated, the level by level findings below have not significantly changed from the prior MRI of 03/06/2017.   Disc degeneration at the non-operative levels, greatest at C3-C4 and C7-T1 (progressed at these levels).   C2-C3: Slight disc bulge. No significant spinal canal or foraminal stenosis.   C3-C4: Disc bulge. New superimposed small central disc protrusion (at site of posterior annular fissure). Uncovertebral hypertrophy on the right, new from the prior MRI. Facet arthrosis. Mild relative spinal canal narrowing (without spinal cord mass effect), new from the prior MRI. Moderate right neural foraminal narrowing, also new from the prior MRI.   C4-C5: Prior ACDF. No significant spinal canal or foraminal stenosis.   C5-C6: Prior ACDF. Uncovertebral hypertrophy on the right. No significant spinal canal stenosis. Mild right neural foraminal narrowing.   C6-C7: Prior ACDF. Uncovertebral vertebrae on the right. No significant spinal canal stenosis. Moderate right neural foraminal narrowing.   C7-T1: 3 mm grade 1 anterolisthesis. Mild disc uncovering. Facet arthrosis. No significant spinal canal stenosis. Mild-to-moderate bilateral neural foraminal narrowing.   IMPRESSION: Prior C4-C7 ACDF. No significant spinal canal stenosis at these levels. As before, uncovertebral hypertrophy  results in neural foraminal narrowing on the right at C5-C6 (mild), and on the right at C6-C7 (moderate).   Cervical spondylosis at the remaining levels, as outlined and with findings most notably as follows.   At C3-C4, there is a disc bulge. New superimposed small central disc protrusion (at site of posterior annular fissure). Uncovertebral hypertrophy on the right, new from the prior MRI. Facet arthrosis. Mild spinal canal narrowing, and moderate right neural foraminal narrowing, new from the prior MRI.   At C7-T1, there is 3 mm grade 1 anterolisthesis. Mild disc uncovering. Facet arthrosis. No significant spinal canal stenosis. Mild-to-moderate bilateral neural foraminal narrowing. Findings at this level have not significantly changed.  COGNITION: Overall cognitive status: Within functional limits for tasks assessed     SENSATION: Not tested  MUSCLE LENGTH: (not tested on 07/10/22) Mild restriction on B hamstrings Moderate restriction on B gastrocsoleus  POSTURE: rounded shoulders and forward head   LOWER EXTREMITY MMT  MMT Right eval Left eval Right 07/10/22 Left 07/10/22  Hip flexion 4 4 4 4   Hip extension 2+ (bridging) 2+  (bridging) 3+  (bridging) 3+ (bridging)  Hip abduction 4 4 4 4   Hip adduction      Hip internal rotation      Hip external rotation      Knee flexion 4- 4- 4 4  Knee extension 4-  3+ 4 4  Ankle dorsiflexion 3+ 4- 4- 4-  Ankle plantarflexion 3+ 3+ 3+ 3+  Ankle inversion      Ankle eversion       (Blank rows = not tested)  LOWER EXTREMITY ROM  Active ROM Right eval Left eval  Hip flexion [facility]  Hip extension    Hip abduction [facility]  Hip adduction [facility]  Hip internal rotation    Hip external rotation    Knee flexion [facility] WFL  Knee extension [facility]  Ankle dorsiflexion [facility] WFL  Ankle plantarflexion [facility] WFL  Ankle inversion    Ankle eversion     (Blank rows = not tested)  FUNCTIONAL TESTS:  5 times sit to stand: 18.88 sec  (little assist from rollator) from 26.97 sec (has to hold on to rollator) 2 minute walk test: 304 ft (with rollator) from 224 ft (with a rollator) Tinetti POMA : 14 from 9 (balance + gait)  GAIT: Distance walked: 304 ft Assistive device utilized:  rollator Level of assistance: Modified independence Comments: foot clearance on B more evident, normal step length on B  VESTIBULAR ASSESSMENT  06/21/22 07/10/22  Head impulse test positive with corrective saccades, dizziness reported positive with corrective saccades, dizziness reported  VOR 1 corrective saccades, dizziness reported corrective saccades, dizziness reported  Optokinetic reflex (R/L)  impaired, dizziness reported  impaired, dizziness reported    TODAY'S TREATMENT:                                                                                                                              DATE:  07/23/22 Diaphragmatic breathing - education and performance - 5 minutes Step up 6 inch 2 x 10 bilateral unilateral HHA LAQ 10 x 5 second holds Standing hamstring curls 2# 2 x 10 Lateral stepping 3 x 8 feet bilateral with CGA Gait with large base quad cane 40 feet with CGA   07/20/2022 Cervical neck AROM flex/ext, rot x 10 Gastrocnemius slant board stretch x 30" x 3 Sit-to-stand, (emphasis on scooting, heel placement back, and trunk momentum), CGA x 10 Standing, firm surface, normal BOS:  Horizontal saccades, while reading words x 1'  Vertical saccades, while reading numbers x 1'  Horizontal smooth pursuit, identifying animal silhouettes x 1'  Vertical smooth pursuit, identifying/counting colors/shapes x 1'  Heel/toe raises, x 2 lbs no UE support x 10 x 2  Tandem stance x 30" x 2, eyes open, light UE support  Rhytmic stabilization x ant/post x 3" x 10 x 2, no UE support  Rocking front/back, L arm swing, R LE forward x 30", R UE support  Mini squats 3" x 10 x 2, no UE support  TKE, GTB x 10 x 2 x 3"  Hip vectors x 10 x 2,  GTB  07/19/2022 Cervical neck AROM flex/ext, rot x 10 Gastrocnemius slant board stretch x 30" x 3 Sit-to-stand, (emphasis on scooting, heel placement back, and trunk  momentum), CGA x 10 Standing, firm surface, normal BOS:  Horizontal saccades, while reading words x 1'  Vertical saccades, while reading numbers x 1'  Horizontal smooth pursuit, identifying animal silhouettes x 1'  Vertical smooth pursuit, identifying/counting colors/shapes x 1'  VOR 1, own pace, identifying colors x 1'  VOR cancellation, reading numbers x 30"  Heel/toe raises, no UE support x 10 x 2  Tandem stance x 30" x 2, eyes open, light UE support  Rhytmic stabilization x ant/post x 3" x 10 x 2, no UE support  Rocking front/back, L arm swing, R LE forward x 30", R UE support  Mini squats 3" x 10 x 2, no UE support  TKE, GTB x 10 x 2 x 3"  Hip vectors x 10 x 2, GTB  07/17/22 Standing HR/TR 2 x 10 Step up 4 inch 1 x 10 bilateral UE support, 1 x 10 unilateral UE support bilateral  Rhytmic stabilization x ant/post and lateral x 3" x 10 x 1, no UE support Lateral stepping 4 x 8 feet bilateral CGA Hip vectors GTB at calf 2 x 5 bilateral  Sit-to-stand, seat slightly elevated (emphasis on scooting, heel placement back, and trunk momentum), CGA x 10; 5 x with emphasis on corkscrew of feet  07/13/2022 Cervical neck AROM flex/ext, rot x 10 Gastrocnemius slant board stretch x 30" x 3 Sit-to-stand, seat slightly elevated (emphasis on scooting, heel placement back, and trunk momentum), CGA x 10 Standing, firm surface, normal BOS:  Horizontal saccades, while reading words x 1'  Vertical saccades, while reading numbers x 1'  Horizontal smooth pursuit, identifying animal silhouettes x 1'  Vertical smooth pursuit, identifying/counting colors/shapes x 1'  VOR 1, own pace, identifying colors x 1'  VOR cancellation, identifying shapes x 30"  Heel/toe raises, no UE support x 10 x 2  Tandem stance x 30" x 2, eyes open, light UE  support Static standing, eyes open x 30" x 2, no UE support  Rhytmic stabilization x ant/post x 3" x 10 x 2, no UE support  Rocking front/back, L arm swing, R LE forward x 30", R UE support  Mini squats 3" x 10 x 2, no UE support  TKE, GTB x 10 x 2 x 3"  Hip vectors x 10 x 2, RTB  07/12/2022 Cervical neck AROM flex/ext, rot x 10 Gastrocnemius slant board stretch x 30" x 3 Sit-to-stand, seat slightly elevated (emphasis on scooting, heel placement back, and trunk momentum), CGA Standing, firm surface, normal BOS:  Horizontal saccades, while reading words x 1'  Vertical saccades, while reading words x 1'  Horizontal smooth pursuit, identifying animal silhouettes x 1'  Vertical smooth pursuit, identifying colors x 1'  VOR 1, own pace, identifying numbers x 1'  VOR cancellation, identifying shapes x 30"  Heel/toe raises with 1 UE support x 10 x 2  Tandem stance x 30" x 2, eyes open, light UE support Static standing, eyes open x 30" x 2 Static standing with head turns x 30" x 2  Rhytmic stabilization x ant/post x 3" x 10 x 2  Mini squats 3" x 10 x 2, no UE support  TKE, RTB x 10 x 2 x 3"  Hip ext x 10 x 2, RTB  07/10/2022 Progress note ( , Tinetti, vestibular exam, ROM, MMT, 5TSTS) Cervical neck AROM flex/ext, rot x 10 Standing, firm surface, normal BOS:  Horizontal saccades, while reading words x 1'  Vertical saccades, while identifying numbers x 1'  Horizontal  smooth pursuit, identifying color/shapes x 1'  Vertical smooth pursuit, identifying animal silhouettes x 1'  VOR 1, own pace, identifying numbers x 1'  07/05/2022 Seated:  Cervical neck AROM flex/ext, rot x 10  Horizontal saccades, while reading words x 1'  Vertical saccades, while identifying numbers x 1'  Horizontal smooth pursuit, identifying color/shapes x 1'  Vertical smooth pursuit, identifying animal silhouettes x 1'  VOR 1, 70 bpm, identifying numbers x 1'  VOR cancellation, identifying colors x  1' Gastrocnemius slant board stretch x 30" x 3 Standing on firm surface, normal BOS:  Heel/toe raises with 1 UE support x 10 x 2  Tandem stance x 30" x 2, eyes open, light UE support Static standing, eyes open x 30" x 2 Static standing with head turns x 30" x 2  Rhytmic stabilization x ant/post x 3" x 10 x 2  Mini squats 3" x 10 x 2, no UE support  TKE, RTB x 10 x 2 x 3"  Hip ext x 10 x 2  07/02/22 Sitting: AROM of the cervical spine flexion/extension, rotation x 10 Gastroc stretch with strap 5 x 20" each  Standing: Heel/toe raises 2 x 10 Hip abduction and extension 2 x 10 each Tandem stance 2 x 30" fingertip support Small base of support standing 3 x 30" no UE support Shoulder width stance with light support with head turns and nods x 10 each  06/28/2022 Seated:  Cervical neck AROM flex/ext, rot x 10  Gastrocnemius strap stretch x 30" x 3  Horizontal saccades, while identifying numbers x 1'  Vertical saccades, while reading words x 1'  Horizontal smooth pursuit, identifying animal silhouettes x 1'  Vertical smooth pursuit, identifying colors/shapes x 1'  VOR 1, 70 bpm, identifying numbers x 1'  VOR cancellation, reading words x 1' Gastrocnemius slant board stretch x 30" x 3 Standing on firm surface, normal BOS:  Heel/toe raises with 1 UE support x 10 x 2  Tandem stance x 30" x 2, eyes open, light UE support Static standing, eyes open x 30"  Static standing with head turns x 30"  Rhytmic stabilization x ant/post x 3" x 10 x 2  Mini squats 3" x 10 x 2, no UE support  TKE, RTB x 10 x 2 x 3"  Hip ext x 10 x 2  06/27/2022 Seated:  Cervical neck AROM flex/ext, rot x 10  Gastrocnemius strap stretch x 30" x 3  Horizontal saccades, while identifying numbers x 1'  Vertical saccades, while reading words x 1'  Horizontal smooth pursuit, identifying animal silhouettes x 1'  Vertical smooth pursuit, identifying colors/shapes x 1'  VOR 1, 70 bpm, identifying numbers x 1'  VOR  cancellation, reading words x 1' Standing on firm surface, normal BOS:  Heel/toe raises with 1 UE support x 10 x 2  Tandem stance x 30" x 3, eyes open, light UE support Static standing, eyes open x 30"  Static standing with head turns x 30"  Rhytmic stabilization x ant/post x 3" x 10  06/21/2022 Seated:  Cervical neck AROM flex/ext, rot x 10  Horizontal saccades, while identifying numbers x 1'  Vertical saccades, while reading words x 1'  Horizontal smooth pursuit, identifying animal silhouettes x 1'  Vertical smooth pursuit, identifying colors/shapes x 1'  VOR 1, 60 bpm, identifying numbers x 1'  VOR cancellation, identifying colors x 1'   PATIENT EDUCATION:  Education details: Updated HEP. Instructed patient to perform standing exercises on with a  stable support (e.g. kitchen counter) and have her husband next to her. Person educated: Patient Education method: Explanation Education comprehension: verbalized understanding  HOME EXERCISE PROGRAM: Access Code: [email or web address] URL: [email or web address]  07/23/22- Supine Diaphragmatic Breathing  - 1 x daily - 7 x weekly - 10 reps - Seated Diaphragmatic Breathing  - 1 x daily - 7 x weekly - 10 reps - Step Up  - 1-2 x daily - 7 x weekly - 2 sets - 10 reps  07/17/22 - Sit to Stand with Arms Crossed  - 1 x daily - 7 x weekly - 1-2 sets - 10 reps  07/13/22: - Standing 3-Way Leg Reach with Resistance at Ankles and Counter Support  - 1 x daily - 7 x weekly - 2 sets - 10 reps  07/02/22 hip abduction, small bos standing and regular standing head turns and nods  06/28/2022 - Mini Squat with Counter Support  - 1-2 x daily - 5-7 x weekly - 2 sets - 10 reps - 3 hold - Standing Hip Extension with Counter Support  - 1-2 x daily - 5-7 x weekly - 2 sets - 10 reps  Date: 06/27/2022 Prepared by: Krystal Clark  Exercises - Seated Calf Stretch with Strap  - 1-2 x daily - 5-7 x weekly - 3 reps - 30 hold - Standing Tandem Balance with  Counter Support  - 1-2 x daily - 5-7 x weekly - 30 hold - Heel Toe Raises with Counter Support  - 1-2 x daily - 5-7 x weekly - 2 sets - 10 reps  06/21/2022 GAZE STABILIZATION EXERCISES (2-3x/day, 5-7 days/week, seated) These exercises are designed to improve your eyes' ability to track objects without getting dizzy. When performing these exercises, make sure you are facing a blank wall (like a white wall) to avoid distraction. You can wear your glasses/contact lenses if you must.   Before anything, warm-up your neck muscles by bending it forward and back 10 times, side-to-side 10 times, and slowly turn your head left and right 10 time Exercise 1 (Horizontal Saccades) Use two cards for this exercise While holding the cards on each hand side by side in sitting with arms outstretched, look at the first item on the other card then look at the next item on the second card then alternate your gaze sequentially. Do NOT move your cards nor the your head. Do the following for one minute each Identifying numbers Identifying shapes Exercise 2 (Par[employer] x1) Use only one card for this exercise While holding the card in sitting with your arm outstretched, turn your head left and right at your own pace while keeping the card stationary.  Do this for one minute each while you are: Identifying numbers Identifying shapes Exercise 3 (VOR cancellation) Use only one card for this exercise While holding the card in sitting with your arms outstretched, turn your body, arm, and head at the same time left and right at your own pace Do this for one minute each while you are: Identifying numbers Identifying shapes  3/11/20204 - VOR x 1 Habituation Exercises 3x daily - Sit/stands w/ 4.5lb weight at home 2 x 5 daily.  ASSESSMENT:  CLINICAL IMPRESSION: Discussed patient's recent "disoriented" symptoms and possible contribution of other health conditions causing. Patient educated on follow up with PCP for further  evaluation of symptoms. Patient educated on and attempts diaphragmatic breathing but she experiences difficulty performing despite cueing. Attempted breathing exercise due to COPD and anxiety to help relax sympathetic nervous system  which may help patients symptoms. Added to HEP and will need to follow up next session on if able to perform properly. Continued with balance and strength exercises which are tolerated well. Moderate fatigue at end of session. Patient will continue to benefit from physical therapy in order to improve function and reduce impairment.   PROGRESS NOTE 07/10/22: Patient demonstrated continued improvements in function as indicated by positive significant changes in , 5TSTS, and strength. However, patient still presents with deficits in vestibular function which greatly affects her standing balance. Patient still presents as a falls risk based on the Tinetti score. With this, skilled PT is still required to address the impairments and functional limitations listed below.  Interventions today were geared towards gaze stabilization incorporated with her standing balance. Patient tolerated gaze stabilization activities with mild dizziness and unsteadiness on the saccades, VOR 1 and cancellation exercises. Demonstrated appropriate levels of fatigue. Rest periods provided. Provided slight amount of cueing to ensure correct execution of activity with good carry-over.    OBJECTIVE IMPAIRMENTS: Abnormal gait, decreased balance, decreased coordination, decreased endurance, difficulty walking, decreased strength, impaired flexibility, and pain.   ACTIVITY LIMITATIONS: carrying, lifting, bending, standing, squatting, stairs, transfers, continence, bathing, toileting, dressing, self feeding, hygiene/grooming, and locomotion level  PARTICIPATION LIMITATIONS: meal prep, cleaning, laundry, driving, shopping, community activity, and yard work  PERSONAL FACTORS: Fitness level, Bladder implant (will  have surgery 07/26/2022), hx of B THR, lobectomy are also affecting patient's functional outcome.   REHAB POTENTIAL: Fair    CLINICAL DECISION MAKING: Evolving/moderate complexity  EVALUATION COMPLEXITY: Moderate   GOALS: Goals reviewed with patient? Yes  SHORT TERM GOALS: Target date: 07/13/2022  Pt will demonstrate indep in HEP to facilitate carry-over of skilled services and improve functional outcomes Goal status: MET  LONG TERM GOALS: Target date: 08/10/2022  Pt will be report little to no dizziness on VOR 1 and head impulse test to facilitate safety on ambulation Baseline: with dizziness Goal status: INITIAL/NEW   Pt will decrease 5TSTS by at least 5 seconds in order to demonstrate clinically significant improvement in LE strength  Baseline: 18.88 sec Goal status: REVISED  3.  Patient will demonstrate increase in Tinetti Score by 8 points in order to demonstrate clinically significant improvement in balance and decreased risk for falls  Baseline: 9 Goal status: IN PROGRESS  4.  Pt will increase by at least 40 ft in order to demonstrate clinically significant improvement in community ambulation Baseline: 304 ft Goal status: REVISED  5.  Pt will demonstrate increase in LE strength to 4 to facilitate ease and safety in ambulation Baseline: 2+ Goal status: IN PROGRESS  6.  Pt will demonstrate improved LE coordination as manifested by performing 3/4 correct trials in heel-to-shin to facilitate ease in ambulation Baseline: impaired Goal status: INITIAL  7.  Pt will be able to walk with a quad cane for > 50 ft Baseline: walks with a rollator Goal status: REVISED   PLAN:  PT FREQUENCY: 3x/week  PT DURATION: 4 weeks  PLANNED INTERVENTIONS: Therapeutic exercises, Therapeutic activity, Neuromuscular re-education, Balance training, Gait training, Patient/Family education, Self Care, and Stair training  PLAN FOR NEXT SESSION: Continue POC and may progress as  tolerated with emphasis on balance, LE strengthening, flexibility, and vestibular function.   12:59 PM, 07/23/22 Wyman Songster PT, DPT Physical Therapist at [facility]

## 2022-07-25 ENCOUNTER — Ambulatory Visit (HOSPITAL_COMMUNITY): Payer: Medicare Other | Admitting: Physical Therapy

## 2022-07-25 ENCOUNTER — Encounter (HOSPITAL_COMMUNITY): Payer: Self-pay | Admitting: Physical Therapy

## 2022-07-25 DIAGNOSIS — R262 Difficulty in walking, not elsewhere classified: Secondary | ICD-10-CM

## 2022-07-25 DIAGNOSIS — M25551 Pain in right hip: Secondary | ICD-10-CM

## 2022-07-25 NOTE — Therapy (Signed)
OUTPATIENT PHYSICAL THERAPY LOWER EXTREMITY TREATMENT NOTE   Patient Name: [name] MRN: [id number] DOB:[birth year], 57 y.o., female Today's Date: 07/25/2022  END OF SESSION:  PT End of Session - 07/25/22 1349     Visit Number 15    Number of Visits 20    Date for PT Re-Evaluation 08/10/22    Authorization Type BCBS Medicare, Medicaid of [place]    Progress Note Due on Visit 20    PT Start Time 1350    PT Stop Time 1440    PT Time Calculation (min) 50 min    Equipment Utilized During Treatment Gait belt;Oxygen    Activity Tolerance Patient tolerated treatment well    Behavior During Therapy WFL for tasks assessed/performed            Past Surgical History:  Procedure Laterality Date   ABDOMINAL HYSTERECTOMY     with bladder suspension   CERVICAL FUSION     CHOLECYSTECTOMY     COLONOSCOPY N/A 07/30/2013   Procedure: COLONOSCOPY;  Surgeon: Malissa Hippo, MD;  Location: [facility];  Service: Endoscopy;  Laterality: N/A;  930   EXAM UNDER ANESTHESIA WITH MANIPULATION OF SHOULDER Right 10/11/2016   Procedure: EXAM UNDER ANESTHESIA WITH MANIPULATION OF SHOULDER;  Surgeon: Vickki Hearing, MD;  Location: [facility];  Service: Orthopedics;  Laterality: Right;   HERNIA REPAIR     INCISIONAL HERNIA REPAIR N/A 07/30/2016   Procedure: HERNIA REPAIR INCISIONAL WITH MESH;  Surgeon: [name], MD;  Location: [facility];  Service: General;  Laterality: N/A;   INTERSTIM IMPLANT PLACEMENT  02/02/2021   Procedure: Leane Platt IMPLANT FIRST STAGE LEFT SACRUM ;  Surgeon: Malen Gauze, MD;  Location: [facility];  Service: Urology;;   Leane Platt IMPLANT PLACEMENT N/A 02/23/2021   Procedure: Leane Platt IMPLANT SECOND STAGE;  Surgeon: Malen Gauze, MD;  Location: [facility];  Service: Urology;  Laterality: N/A;  Rep coming, time needs to stay at 2:00   JOINT REPLACEMENT     LOBECTOMY     right lung    POSTERIOR CERVICAL LAMINECTOMY Right 04/05/2017   Procedure: Right C6-7 Posterior  cervical laminectomy;  Surgeon: Donalee Citrin, MD;  Location: [facility] OR;  Service: Neurosurgery;  Laterality: Right;  Right C6-7 Posterior cervical laminectomy   SHOULDER ARTHROSCOPY WITH ROTATOR CUFF REPAIR Right 10/11/2016   Procedure: SHOULDER ARTHROSCOPY;  Surgeon: Vickki Hearing, MD;  Location: [facility];  Service: Orthopedics;  Laterality: Right;   TOTAL HIP ARTHROPLASTY Right 04/15/2020   Procedure: RIGHT TOTAL HIP ARTHROPLASTY ANTERIOR APPROACH;  Surgeon: Kathryne Hitch, MD;  Location: [facility];  Service: Orthopedics;  Laterality: Right;   TOTAL HIP ARTHROPLASTY Left 07/22/2020   Procedure: LEFT  HIP ARTHROPLASTY ANTERIOR APPROACH;  Surgeon: Kathryne Hitch, MD;  Location: [facility];  Service: Orthopedics;  Laterality: Left;  RNFA   TUBAL LIGATION     Patient Active Problem List   Diagnosis Date Noted   Severe persistent asthma without complication 06/06/2022   Gait abnormality 05/28/2022   Adrenal insufficiency 03/30/2022   Endocrine disorder, unspecified 07/04/2021   Prediabetes 07/04/2021   Hypothyroidism 07/04/2021   Former smoker 06/29/2021   Seasonal and perennial allergic rhinitis 06/19/2021   Oxygen dependent 06/19/2021   Steroid dependent 06/19/2021   Immunosuppressed status 06/19/2021   Radiculopathy, lumbar region 12/08/2020   Status post left hip replacement 07/22/2020   Status post right hip replacement 05/02/2020   Status post total replacement of right hip 04/15/2020   Avascular necrosis  of bone of left hip 02/10/2020   Avascular necrosis of bone of right hip 02/10/2020   Allergic rhinitis 05/21/2019   Asthma-COPD overlap syndrome 04/30/2019   Chronic respiratory failure with hypoxia 04/30/2019   Spinal stenosis of cervical region 04/05/2017   Partial tear of right subscapularis tendon    Incisional hernia, without obstruction or gangrene    Urinary frequency 03/21/2012   Migraine equivalent syndrome 03/21/2012   Insomnia 03/21/2012    PCP: Benita Stabile, MD  REFERRING PROVIDER: Levert Feinstein, MD  REFERRING DIAG: R26.9 (ICD-10-CM) - Gait abnormality  THERAPY DIAG:  Difficulty in walking, not elsewhere classified  Pain in right hip  Rationale for Evaluation and Treatment: Rehabilitation  ONSET DATE: April 12, 2022  SUBJECTIVE:   SUBJECTIVE STATEMENT: Patient feeling better today with no disorientation when she first woke up.   PROGRESS NOTE 07/10/22: Patient states that she went to her neurologist and was diagnosed with vestibular neuritis. Patient says she will be referred to a neurologist. Patient states that she's still dizzy ("wavy" sensation) but not as bad as she used to be. Patient states that her balance is still off but has improved a little since she started PT. Denies any recent falls. Patient thinks she has improved around 25%. With this, patient wishes to continue with PT.   PERTINENT HISTORY: Bladder implant (will have surgery 07/26/2022), hx of B THR, lobectomy PAIN:  Are you having pain? No  PRECAUTIONS: Fall and Other: uses portable O2  WEIGHT BEARING RESTRICTIONS: No  FALLS:  Has patient fallen in last 6 months? Yes. Number of falls 7  LIVING ENVIRONMENT: Lives with: lives with their spouse Lives in: House/apartment Stairs: Yes: External: 1 steps; none Has following equipment at home: Single point cane, Walker - 2 wheeled, Environmental consultant - 4 wheeled, shower chair, bed side commode, and oxygen  OCCUPATION: on disability  PLOF: Independent with household mobility with device and Requires assistive device for independence  PATIENT GOALS: "to be able to walk straight by myself"  NEXT MD VISIT: Aug 30, 2022  OBJECTIVE:   DIAGNOSTIC FINDINGS:  MRI HEAD WITHOUT CONTRAST 06/08/22   TECHNIQUE: Multiplanar, multiecho pulse sequences of the brain and surrounding structures were obtained without intravenous contrast.   COMPARISON:  Head CT 05/22/2022. Brain MRI 05/22/2011.   FINDINGS: Brain:   No age  advanced or lobar predominant parenchymal atrophy.   Multifocal T2 FLAIR hyperintense signal abnormality within the cerebral white matter, overall mild but greater than expected for age.   There is no acute infarct.   No evidence of an intracranial mass.   No chronic intracranial blood products.   No extra-axial fluid collection.   No midline shift.   Vascular: Maintained flow voids within the proximal large arterial vessels.   Skull and upper cervical spine: No focal suspicious calvarial marrow lesion. Cervical spine findings separately reported on same day cervical spine MRI.   Sinuses/Orbits: No mass or acute finding within the imaged orbits. Prior bilateral ocular lens replacement. Trace mucosal thickening within left ethmoid air cells.   Other: Small-volume fluid within the bilateral mastoid air cells.   IMPRESSION: 1.  No evidence of an acute intracranial abnormality. 2. Multifocal T2 FLAIR hyperintense signal abnormality within the cerebral white matter, overall mild but greater than expected for age. These signal changes have slightly progressed from the prior brain MRI of 05/22/2011. Findings are nonspecific and differential considerations include chronic small vessel ischemic disease, sequelae of chronic migraine headaches, sequelae of a prior  infectious/inflammatory process or sequelae of a demyelinating process (such as multiple sclerosis), among others. No lesions specifically suggest demyelinating disease. 3. Small-volume fluid within the bilateral mastoid air cells.  MRI CERVICAL SPINE WITHOUT CONTRAST 06/08/22   TECHNIQUE: Multiplanar, multisequence MR imaging of the cervical spine was performed. No intravenous contrast was administered.   COMPARISON:  Radiographs of the cervical spine 08/06/2017. Cervical spine MRI 02/24/2017.   FINDINGS: Alignment: Straightening of the expected cervical lordosis. 3 mm C7-T1 grade 1 anterolisthesis.   Vertebrae:  Vertebral body height is maintained. Susceptibility artifact arising from ACDF hardware at the C4-C7 levels. No appreciable significant marrow edema or focal suspicious osseous lesion.   Cord: No signal abnormality identified within the cervical spinal cord.   Posterior Fossa, vertebral arteries, paraspinal tissues: Posterior fossa assessed on same-day brain MRI. Flow voids preserved within the imaged cervical vertebral arteries. No paraspinal mass or collection.   Disc levels:   Unless otherwise stated, the level by level findings below have not significantly changed from the prior MRI of 03/06/2017.   Disc degeneration at the non-operative levels, greatest at C3-C4 and C7-T1 (progressed at these levels).   C2-C3: Slight disc bulge. No significant spinal canal or foraminal stenosis.   C3-C4: Disc bulge. New superimposed small central disc protrusion (at site of posterior annular fissure). Uncovertebral hypertrophy on the right, new from the prior MRI. Facet arthrosis. Mild relative spinal canal narrowing (without spinal cord mass effect), new from the prior MRI. Moderate right neural foraminal narrowing, also new from the prior MRI.   C4-C5: Prior ACDF. No significant spinal canal or foraminal stenosis.   C5-C6: Prior ACDF. Uncovertebral hypertrophy on the right. No significant spinal canal stenosis. Mild right neural foraminal narrowing.   C6-C7: Prior ACDF. Uncovertebral vertebrae on the right. No significant spinal canal stenosis. Moderate right neural foraminal narrowing.   C7-T1: 3 mm grade 1 anterolisthesis. Mild disc uncovering. Facet arthrosis. No significant spinal canal stenosis. Mild-to-moderate bilateral neural foraminal narrowing.   IMPRESSION: Prior C4-C7 ACDF. No significant spinal canal stenosis at these levels. As before, uncovertebral hypertrophy results in neural foraminal narrowing on the right at C5-C6 (mild), and on the right at C6-C7  (moderate).   Cervical spondylosis at the remaining levels, as outlined and with findings most notably as follows.   At C3-C4, there is a disc bulge. New superimposed small central disc protrusion (at site of posterior annular fissure). Uncovertebral hypertrophy on the right, new from the prior MRI. Facet arthrosis. Mild spinal canal narrowing, and moderate right neural foraminal narrowing, new from the prior MRI.   At C7-T1, there is 3 mm grade 1 anterolisthesis. Mild disc uncovering. Facet arthrosis. No significant spinal canal stenosis. Mild-to-moderate bilateral neural foraminal narrowing. Findings at this level have not significantly changed.  COGNITION: Overall cognitive status: Within functional limits for tasks assessed     SENSATION: Not tested  MUSCLE LENGTH: (not tested on 07/10/22) Mild restriction on B hamstrings Moderate restriction on B gastrocsoleus  POSTURE: rounded shoulders and forward head   LOWER EXTREMITY MMT  MMT Right eval Left eval Right 07/10/22 Left 07/10/22  Hip flexion 4 4 4 4   Hip extension 2+ (bridging) 2+  (bridging) 3+  (bridging) 3+ (bridging)  Hip abduction 4 4 4 4   Hip adduction      Hip internal rotation      Hip external rotation      Knee flexion 4- 4- 4 4  Knee extension 4- 3+ 4 4  Ankle dorsiflexion 3+  4- 4- 4-  Ankle plantarflexion 3+ 3+ 3+ 3+  Ankle inversion      Ankle eversion       (Blank rows = not tested)  LOWER EXTREMITY ROM  Active ROM Right eval Left eval  Hip flexion [facility]  Hip extension    Hip abduction [facility]  Hip adduction [facility]  Hip internal rotation    Hip external rotation    Knee flexion [facility] WFL  Knee extension [facility]  Ankle dorsiflexion [facility] WFL  Ankle plantarflexion [facility] WFL  Ankle inversion    Ankle eversion     (Blank rows = not tested)  FUNCTIONAL TESTS:  5 times sit to stand: 18.88 sec (little assist from rollator) from 26.97 sec (has to hold on to rollator) 2 minute walk test: 304  ft (with rollator) from 224 ft (with a rollator) Tinetti POMA : 14 from 9 (balance + gait)  GAIT: Distance walked: 304 ft Assistive device utilized:  rollator Level of assistance: Modified independence Comments: foot clearance on B more evident, normal step length on B  VESTIBULAR ASSESSMENT  06/21/22 07/10/22  Head impulse test positive with corrective saccades, dizziness reported positive with corrective saccades, dizziness reported  VOR 1 corrective saccades, dizziness reported corrective saccades, dizziness reported  Optokinetic reflex (R/L)  impaired, dizziness reported  impaired, dizziness reported    TODAY'S TREATMENT:                                                                                                                              DATE:  07/25/22 Gait with large base quad cane 300 feet with CGA STS 1 x 10 Anterior stepping with bilateral overhead reach 1 x 15 bilateral   07/23/22 Diaphragmatic breathing - education and performance - 5 minutes Step up 6 inch 2 x 10 bilateral unilateral HHA LAQ 10 x 5 second holds Standing hamstring curls 2# 2 x 10 Lateral stepping 3 x 8 feet bilateral with CGA Gait with large base quad cane 40 feet with CGA   07/20/2022 Cervical neck AROM flex/ext, rot x 10 Gastrocnemius slant board stretch x 30" x 3 Sit-to-stand, (emphasis on scooting, heel placement back, and trunk momentum), CGA x 10 Standing, firm surface, normal BOS:  Horizontal saccades, while reading words x 1'  Vertical saccades, while reading numbers x 1'  Horizontal smooth pursuit, identifying animal silhouettes x 1'  Vertical smooth pursuit, identifying/counting colors/shapes x 1'  Heel/toe raises, x 2 lbs no UE support x 10 x 2  Tandem stance x 30" x 2, eyes open, light UE support  Rhytmic stabilization x ant/post x 3" x 10 x 2, no UE support  Rocking front/back, L arm swing, R LE forward x 30", R UE support  Mini squats 3" x 10 x 2, no UE support  TKE, GTB x 10 x  2 x 3"  Hip vectors x 10 x 2, GTB  07/19/2022 Cervical neck AROM flex/ext,  rot x 10 Gastrocnemius slant board stretch x 30" x 3 Sit-to-stand, (emphasis on scooting, heel placement back, and trunk momentum), CGA x 10 Standing, firm surface, normal BOS:  Horizontal saccades, while reading words x 1'  Vertical saccades, while reading numbers x 1'  Horizontal smooth pursuit, identifying animal silhouettes x 1'  Vertical smooth pursuit, identifying/counting colors/shapes x 1'  VOR 1, own pace, identifying colors x 1'  VOR cancellation, reading numbers x 30"  Heel/toe raises, no UE support x 10 x 2  Tandem stance x 30" x 2, eyes open, light UE support  Rhytmic stabilization x ant/post x 3" x 10 x 2, no UE support  Rocking front/back, L arm swing, R LE forward x 30", R UE support  Mini squats 3" x 10 x 2, no UE support  TKE, GTB x 10 x 2 x 3"  Hip vectors x 10 x 2, GTB  07/17/22 Standing HR/TR 2 x 10 Step up 4 inch 1 x 10 bilateral UE support, 1 x 10 unilateral UE support bilateral  Rhytmic stabilization x ant/post and lateral x 3" x 10 x 1, no UE support Lateral stepping 4 x 8 feet bilateral CGA Hip vectors GTB at calf 2 x 5 bilateral  Sit-to-stand, seat slightly elevated (emphasis on scooting, heel placement back, and trunk momentum), CGA x 10; 5 x with emphasis on corkscrew of feet  07/13/2022 Cervical neck AROM flex/ext, rot x 10 Gastrocnemius slant board stretch x 30" x 3 Sit-to-stand, seat slightly elevated (emphasis on scooting, heel placement back, and trunk momentum), CGA x 10 Standing, firm surface, normal BOS:  Horizontal saccades, while reading words x 1'  Vertical saccades, while reading numbers x 1'  Horizontal smooth pursuit, identifying animal silhouettes x 1'  Vertical smooth pursuit, identifying/counting colors/shapes x 1'  VOR 1, own pace, identifying colors x 1'  VOR cancellation, identifying shapes x 30"  Heel/toe raises, no UE support x 10 x 2  Tandem stance x  30" x 2, eyes open, light UE support Static standing, eyes open x 30" x 2, no UE support  Rhytmic stabilization x ant/post x 3" x 10 x 2, no UE support  Rocking front/back, L arm swing, R LE forward x 30", R UE support  Mini squats 3" x 10 x 2, no UE support  TKE, GTB x 10 x 2 x 3"  Hip vectors x 10 x 2, RTB  07/12/2022 Cervical neck AROM flex/ext, rot x 10 Gastrocnemius slant board stretch x 30" x 3 Sit-to-stand, seat slightly elevated (emphasis on scooting, heel placement back, and trunk momentum), CGA Standing, firm surface, normal BOS:  Horizontal saccades, while reading words x 1'  Vertical saccades, while reading words x 1'  Horizontal smooth pursuit, identifying animal silhouettes x 1'  Vertical smooth pursuit, identifying colors x 1'  VOR 1, own pace, identifying numbers x 1'  VOR cancellation, identifying shapes x 30"  Heel/toe raises with 1 UE support x 10 x 2  Tandem stance x 30" x 2, eyes open, light UE support Static standing, eyes open x 30" x 2 Static standing with head turns x 30" x 2  Rhytmic stabilization x ant/post x 3" x 10 x 2  Mini squats 3" x 10 x 2, no UE support  TKE, RTB x 10 x 2 x 3"  Hip ext x 10 x 2, RTB  07/10/2022 Progress note ( , Tinetti, vestibular exam, ROM, MMT, 5TSTS) Cervical neck AROM flex/ext, rot x 10 Standing, firm surface,  normal BOS:  Horizontal saccades, while reading words x 1'  Vertical saccades, while identifying numbers x 1'  Horizontal smooth pursuit, identifying color/shapes x 1'  Vertical smooth pursuit, identifying animal silhouettes x 1'  VOR 1, own pace, identifying numbers x 1'  07/05/2022 Seated:  Cervical neck AROM flex/ext, rot x 10  Horizontal saccades, while reading words x 1'  Vertical saccades, while identifying numbers x 1'  Horizontal smooth pursuit, identifying color/shapes x 1'  Vertical smooth pursuit, identifying animal silhouettes x 1'  VOR 1, 70 bpm, identifying numbers x 1'  VOR cancellation,  identifying colors x 1' Gastrocnemius slant board stretch x 30" x 3 Standing on firm surface, normal BOS:  Heel/toe raises with 1 UE support x 10 x 2  Tandem stance x 30" x 2, eyes open, light UE support Static standing, eyes open x 30" x 2 Static standing with head turns x 30" x 2  Rhytmic stabilization x ant/post x 3" x 10 x 2  Mini squats 3" x 10 x 2, no UE support  TKE, RTB x 10 x 2 x 3"  Hip ext x 10 x 2  07/02/22 Sitting: AROM of the cervical spine flexion/extension, rotation x 10 Gastroc stretch with strap 5 x 20" each  Standing: Heel/toe raises 2 x 10 Hip abduction and extension 2 x 10 each Tandem stance 2 x 30" fingertip support Small base of support standing 3 x 30" no UE support Shoulder width stance with light support with head turns and nods x 10 each  06/28/2022 Seated:  Cervical neck AROM flex/ext, rot x 10  Gastrocnemius strap stretch x 30" x 3  Horizontal saccades, while identifying numbers x 1'  Vertical saccades, while reading words x 1'  Horizontal smooth pursuit, identifying animal silhouettes x 1'  Vertical smooth pursuit, identifying colors/shapes x 1'  VOR 1, 70 bpm, identifying numbers x 1'  VOR cancellation, reading words x 1' Gastrocnemius slant board stretch x 30" x 3 Standing on firm surface, normal BOS:  Heel/toe raises with 1 UE support x 10 x 2  Tandem stance x 30" x 2, eyes open, light UE support Static standing, eyes open x 30"  Static standing with head turns x 30"  Rhytmic stabilization x ant/post x 3" x 10 x 2  Mini squats 3" x 10 x 2, no UE support  TKE, RTB x 10 x 2 x 3"  Hip ext x 10 x 2  06/27/2022 Seated:  Cervical neck AROM flex/ext, rot x 10  Gastrocnemius strap stretch x 30" x 3  Horizontal saccades, while identifying numbers x 1'  Vertical saccades, while reading words x 1'  Horizontal smooth pursuit, identifying animal silhouettes x 1'  Vertical smooth pursuit, identifying colors/shapes x 1'  VOR 1, 70 bpm, identifying  numbers x 1'  VOR cancellation, reading words x 1' Standing on firm surface, normal BOS:  Heel/toe raises with 1 UE support x 10 x 2  Tandem stance x 30" x 3, eyes open, light UE support Static standing, eyes open x 30"  Static standing with head turns x 30"  Rhytmic stabilization x ant/post x 3" x 10  06/21/2022 Seated:  Cervical neck AROM flex/ext, rot x 10  Horizontal saccades, while identifying numbers x 1'  Vertical saccades, while reading words x 1'  Horizontal smooth pursuit, identifying animal silhouettes x 1'  Vertical smooth pursuit, identifying colors/shapes x 1'  VOR 1, 60 bpm, identifying numbers x 1'  VOR cancellation, identifying colors  x 1'   PATIENT EDUCATION:  Education details: Updated HEP. Instructed patient to perform standing exercises on with a stable support (e.g. kitchen counter) and have her husband next to her. Educated on possible anxiety related/multiple systems contributing to symptoms Person educated: Patient Education method: Explanation Education comprehension: verbalized understanding  HOME EXERCISE PROGRAM: Access Code: [email or web address] URL: [email or web address]  07/25/22 - Forward Step with Same Side Arm Raise  - 1 x daily - 7 x weekly - 2 sets - 10 reps  07/23/22- Supine Diaphragmatic Breathing  - 1 x daily - 7 x weekly - 10 reps - Seated Diaphragmatic Breathing  - 1 x daily - 7 x weekly - 10 reps - Step Up  - 1-2 x daily - 7 x weekly - 2 sets - 10 reps  07/17/22 - Sit to Stand with Arms Crossed  - 1 x daily - 7 x weekly - 1-2 sets - 10 reps  07/13/22: - Standing 3-Way Leg Reach with Resistance at Ankles and Counter Support  - 1 x daily - 7 x weekly - 2 sets - 10 reps  07/02/22 hip abduction, small bos standing and regular standing head turns and nods  06/28/2022 - Mini Squat with Counter Support  - 1-2 x daily - 5-7 x weekly - 2 sets - 10 reps - 3 hold - Standing Hip Extension with Counter Support  - 1-2 x daily - 5-7 x weekly - 2  sets - 10 reps  Date: 06/27/2022 Prepared by: Krystal Clark  Exercises - Seated Calf Stretch with Strap  - 1-2 x daily - 5-7 x weekly - 3 reps - 30 hold - Standing Tandem Balance with Counter Support  - 1-2 x daily - 5-7 x weekly - 30 hold - Heel Toe Raises with Counter Support  - 1-2 x daily - 5-7 x weekly - 2 sets - 10 reps  06/21/2022 GAZE STABILIZATION EXERCISES (2-3x/day, 5-7 days/week, seated) These exercises are designed to improve your eyes' ability to track objects without getting dizzy. When performing these exercises, make sure you are facing a blank wall (like a white wall) to avoid distraction. You can wear your glasses/contact lenses if you must.   Before anything, warm-up your neck muscles by bending it forward and back 10 times, side-to-side 10 times, and slowly turn your head left and right 10 time Exercise 1 (Horizontal Saccades) Use two cards for this exercise While holding the cards on each hand side by side in sitting with arms outstretched, look at the first item on the other card then look at the next item on the second card then alternate your gaze sequentially. Do NOT move your cards nor the your head. Do the following for one minute each Identifying numbers Identifying shapes Exercise 2 (Par[employer]igm x1) Use only one card for this exercise While holding the card in sitting with your arm outstretched, turn your head left and right at your own pace while keeping the card stationary.  Do this for one minute each while you are: Identifying numbers Identifying shapes Exercise 3 (VOR cancellation) Use only one card for this exercise While holding the card in sitting with your arms outstretched, turn your body, arm, and head at the same time left and right at your own pace Do this for one minute each while you are: Identifying numbers Identifying shapes  3/11/20204 - VOR x 1 Habituation Exercises 3x daily - Sit/stands w/ 4.5lb weight at home 2 x 5  daily.  ASSESSMENT:  CLINICAL  IMPRESSION: Patient demonstrating improving gait mechanics with quad cane with cueing. Discussed patients question of anxiety related to symptoms and possible multisystem contribution. Continued with strength and balance training. Patient will continue to benefit from physical therapy in order to improve function and reduce impairment.   PROGRESS NOTE 07/10/22: Patient demonstrated continued improvements in function as indicated by positive significant changes in , 5TSTS, and strength. However, patient still presents with deficits in vestibular function which greatly affects her standing balance. Patient still presents as a falls risk based on the Tinetti score. With this, skilled PT is still required to address the impairments and functional limitations listed below.  Interventions today were geared towards gaze stabilization incorporated with her standing balance. Patient tolerated gaze stabilization activities with mild dizziness and unsteadiness on the saccades, VOR 1 and cancellation exercises. Demonstrated appropriate levels of fatigue. Rest periods provided. Provided slight amount of cueing to ensure correct execution of activity with good carry-over.    OBJECTIVE IMPAIRMENTS: Abnormal gait, decreased balance, decreased coordination, decreased endurance, difficulty walking, decreased strength, impaired flexibility, and pain.   ACTIVITY LIMITATIONS: carrying, lifting, bending, standing, squatting, stairs, transfers, continence, bathing, toileting, dressing, self feeding, hygiene/grooming, and locomotion level  PARTICIPATION LIMITATIONS: meal prep, cleaning, laundry, driving, shopping, community activity, and yard work  PERSONAL FACTORS: Fitness level, Bladder implant (will have surgery 07/26/2022), hx of B THR, lobectomy are also affecting patient's functional outcome.   REHAB POTENTIAL: Fair    CLINICAL DECISION MAKING: Evolving/moderate  complexity  EVALUATION COMPLEXITY: Moderate   GOALS: Goals reviewed with patient? Yes  SHORT TERM GOALS: Target date: 07/13/2022  Pt will demonstrate indep in HEP to facilitate carry-over of skilled services and improve functional outcomes Goal status: MET  LONG TERM GOALS: Target date: 08/10/2022  Pt will be report little to no dizziness on VOR 1 and head impulse test to facilitate safety on ambulation Baseline: with dizziness Goal status: INITIAL/NEW   Pt will decrease 5TSTS by at least 5 seconds in order to demonstrate clinically significant improvement in LE strength  Baseline: 18.88 sec Goal status: REVISED  3.  Patient will demonstrate increase in Tinetti Score by 8 points in order to demonstrate clinically significant improvement in balance and decreased risk for falls  Baseline: 9 Goal status: IN PROGRESS  4.  Pt will increase by at least 40 ft in order to demonstrate clinically significant improvement in community ambulation Baseline: 304 ft Goal status: REVISED  5.  Pt will demonstrate increase in LE strength to 4 to facilitate ease and safety in ambulation Baseline: 2+ Goal status: IN PROGRESS  6.  Pt will demonstrate improved LE coordination as manifested by performing 3/4 correct trials in heel-to-shin to facilitate ease in ambulation Baseline: impaired Goal status: INITIAL  7.  Pt will be able to walk with a quad cane for > 50 ft Baseline: walks with a rollator Goal status: REVISED   PLAN:  PT FREQUENCY: 3x/week  PT DURATION: 4 weeks  PLANNED INTERVENTIONS: Therapeutic exercises, Therapeutic activity, Neuromuscular re-education, Balance training, Gait training, Patient/Family education, Self Care, and Stair training  PLAN FOR NEXT SESSION: Continue POC and may progress as tolerated with emphasis on balance, LE strengthening, flexibility, and vestibular function.   2:56 PM, 07/25/22 Wyman Songster PT, DPT Physical Therapist at [facility]

## 2022-07-26 ENCOUNTER — Ambulatory Visit: Admit: 2022-07-26 | Payer: Medicare Other | Admitting: Urology

## 2022-07-26 SURGERY — REVISION, SACRAL NERVE STIMULATOR, INTERSTIM
Anesthesia: Monitor Anesthesia Care

## 2022-07-27 ENCOUNTER — Ambulatory Visit (HOSPITAL_COMMUNITY): Payer: Medicare Other

## 2022-07-27 DIAGNOSIS — R262 Difficulty in walking, not elsewhere classified: Secondary | ICD-10-CM | POA: Diagnosis not present

## 2022-07-27 DIAGNOSIS — M25551 Pain in right hip: Secondary | ICD-10-CM

## 2022-07-27 NOTE — Therapy (Signed)
OUTPATIENT PHYSICAL THERAPY LOWER EXTREMITY TREATMENT NOTE   Patient Name: [name] MRN: [id number] DOB:05-20-1965, 57 y.o., female Today's Date: 07/27/2022  END OF SESSION:  PT End of Session - 07/27/22 1433     Visit Number 16    Number of Visits 20    Date for PT Re-Evaluation 08/10/22    Authorization Type BCBS Medicare, Medicaid of [place]    Progress Note Due on Visit 20    PT Start Time 1433    PT Stop Time 1513    PT Time Calculation (min) 40 min    Equipment Utilized During Treatment Gait belt;Oxygen    Activity Tolerance Patient tolerated treatment well    Behavior During Therapy WFL for tasks assessed/performed            Past Surgical History:  Procedure Laterality Date   ABDOMINAL HYSTERECTOMY     with bladder suspension   CERVICAL FUSION     CHOLECYSTECTOMY     COLONOSCOPY N/A 07/30/2013   Procedure: COLONOSCOPY;  Surgeon: Malissa Hippo, MD;  [facility]: [facility];  Service: Endoscopy;  Laterality: N/A;  930   EXAM UNDER ANESTHESIA WITH MANIPULATION OF SHOULDER Right 10/11/2016   Procedure: EXAM UNDER ANESTHESIA WITH MANIPULATION OF SHOULDER;  Surgeon: Vickki Hearing, MD;  Location: [facility];  Service: Orthopedics;  Laterality: Right;   HERNIA REPAIR     INCISIONAL HERNIA REPAIR N/A 07/30/2016   Procedure: HERNIA REPAIR INCISIONAL WITH MESH;  Surgeon: [name], MD;  Location: [facility];  Service: General;  Laterality: N/A;   INTERSTIM IMPLANT PLACEMENT  02/02/2021   Procedure: Leane Platt IMPLANT FIRST STAGE LEFT SACRUM ;  Surgeon: Malen Gauze, MD;  Location: [facility];  Service: Urology;;   Leane Platt IMPLANT PLACEMENT N/A 02/23/2021   Procedure: Leane Platt IMPLANT SECOND STAGE;  Surgeon: Malen Gauze, MD;  Location: [facility];  Service: Urology;  Laterality: N/A;  Rep coming, time needs to stay at 2:00   JOINT REPLACEMENT     LOBECTOMY     right lung    POSTERIOR CERVICAL LAMINECTOMY Right 04/05/2017   Procedure: Right C6-7 Posterior  cervical laminectomy;  Surgeon: Donalee Citrin, MD;  Location: [facility] OR;  Service: Neurosurgery;  Laterality: Right;  Right C6-7 Posterior cervical laminectomy   SHOULDER ARTHROSCOPY WITH ROTATOR CUFF REPAIR Right 10/11/2016   Procedure: SHOULDER ARTHROSCOPY;  Surgeon: Vickki Hearing, MD;  Location: [facility];  Service: Orthopedics;  Laterality: Right;   TOTAL HIP ARTHROPLASTY Right 04/15/2020   Procedure: RIGHT TOTAL HIP ARTHROPLASTY ANTERIOR APPROACH;  Surgeon: Kathryne Hitch, MD;  Location: [facility];  Service: Orthopedics;  Laterality: Right;   TOTAL HIP ARTHROPLASTY Left 07/22/2020   Procedure: LEFT  HIP ARTHROPLASTY ANTERIOR APPROACH;  Surgeon: Kathryne Hitch, MD;  Location: [facility];  Service: Orthopedics;  Laterality: Left;  RNFA   TUBAL LIGATION     Patient Active Problem List   Diagnosis Date Noted   Severe persistent asthma without complication 06/06/2022   Gait abnormality 05/28/2022   Adrenal insufficiency 03/30/2022   Endocrine disorder, unspecified 07/04/2021   Prediabetes 07/04/2021   Hypothyroidism 07/04/2021   Former smoker 06/29/2021   Seasonal and perennial allergic rhinitis 06/19/2021   Oxygen dependent 06/19/2021   Steroid dependent 06/19/2021   Immunosuppressed status 06/19/2021   Radiculopathy, lumbar region 12/08/2020   Status post left hip replacement 07/22/2020   Status post right hip replacement 05/02/2020   Status post total replacement of right hip 04/15/2020   Avascular necrosis  of bone of left hip 02/10/2020   Avascular necrosis of bone of right hip 02/10/2020   Allergic rhinitis 05/21/2019   Asthma-COPD overlap syndrome 04/30/2019   Chronic respiratory failure with hypoxia 04/30/2019   Spinal stenosis of cervical region 04/05/2017   Partial tear of right subscapularis tendon    Incisional hernia, without obstruction or gangrene    Urinary frequency 03/21/2012   Migraine equivalent syndrome 03/21/2012   Insomnia 03/21/2012    PCP: Benita Stabile, MD  REFERRING PROVIDER: Levert Feinstein, MD  REFERRING DIAG: R26.9 (ICD-10-CM) - Gait abnormality  THERAPY DIAG:  Difficulty in walking, not elsewhere classified  Pain in right hip  Rationale for Evaluation and Treatment: Rehabilitation  ONSET DATE: April 12, 2022  SUBJECTIVE:   SUBJECTIVE STATEMENT: Feeling ok today; "my legs are sore" from exercises and walking.  Will see an ENT/ Dr. Suszanne Conners May 1st  PROGRESS NOTE 07/10/22: Patient states that she went to her neurologist and was diagnosed with vestibular neuritis. Patient says she will be referred to a neurologist. Patient states that she's still dizzy ("wavy" sensation) but not as bad as she used to be. Patient states that her balance is still off but has improved a little since she started PT. Denies any recent falls. Patient thinks she has improved around 25%. With this, patient wishes to continue with PT.   PERTINENT HISTORY: Bladder implant (will have surgery 07/26/2022), hx of B THR, lobectomy PAIN:  Are you having pain? No  PRECAUTIONS: Fall and Other: uses portable O2  WEIGHT BEARING RESTRICTIONS: No  FALLS:  Has patient fallen in last 6 months? Yes. Number of falls 7  LIVING ENVIRONMENT: Lives with: lives with their spouse Lives in: House/apartment Stairs: Yes: External: 1 steps; none Has following equipment at home: Single point cane, Walker - 2 wheeled, Environmental consultant - 4 wheeled, shower chair, bed side commode, and oxygen  OCCUPATION: on disability  PLOF: Independent with household mobility with device and Requires assistive device for independence  PATIENT GOALS: "to be able to walk straight by myself"  NEXT MD VISIT: Aug 30, 2022  OBJECTIVE:   DIAGNOSTIC FINDINGS:  MRI HEAD WITHOUT CONTRAST 06/08/22   TECHNIQUE: Multiplanar, multiecho pulse sequences of the brain and surrounding structures were obtained without intravenous contrast.   COMPARISON:  Head CT 05/22/2022. Brain MRI 05/22/2011.    FINDINGS: Brain:   No age advanced or lobar predominant parenchymal atrophy.   Multifocal T2 FLAIR hyperintense signal abnormality within the cerebral white matter, overall mild but greater than expected for age.   There is no acute infarct.   No evidence of an intracranial mass.   No chronic intracranial blood products.   No extra-axial fluid collection.   No midline shift.   Vascular: Maintained flow voids within the proximal large arterial vessels.   Skull and upper cervical spine: No focal suspicious calvarial marrow lesion. Cervical spine findings separately reported on same day cervical spine MRI.   Sinuses/Orbits: No mass or acute finding within the imaged orbits. Prior bilateral ocular lens replacement. Trace mucosal thickening within left ethmoid air cells.   Other: Small-volume fluid within the bilateral mastoid air cells.   IMPRESSION: 1.  No evidence of an acute intracranial abnormality. 2. Multifocal T2 FLAIR hyperintense signal abnormality within the cerebral white matter, overall mild but greater than expected for age. These signal changes have slightly progressed from the prior brain MRI of 05/22/2011. Findings are nonspecific and differential considerations include chronic small vessel ischemic disease, sequelae of  chronic migraine headaches, sequelae of a prior infectious/inflammatory process or sequelae of a demyelinating process (such as multiple sclerosis), among others. No lesions specifically suggest demyelinating disease. 3. Small-volume fluid within the bilateral mastoid air cells.  MRI CERVICAL SPINE WITHOUT CONTRAST 06/08/22   TECHNIQUE: Multiplanar, multisequence MR imaging of the cervical spine was performed. No intravenous contrast was administered.   COMPARISON:  Radiographs of the cervical spine 08/06/2017. Cervical spine MRI 02/24/2017.   FINDINGS: Alignment: Straightening of the expected cervical lordosis. 3 mm C7-T1 grade 1  anterolisthesis.   Vertebrae: Vertebral body height is maintained. Susceptibility artifact arising from ACDF hardware at the C4-C7 levels. No appreciable significant marrow edema or focal suspicious osseous lesion.   Cord: No signal abnormality identified within the cervical spinal cord.   Posterior Fossa, vertebral arteries, paraspinal tissues: Posterior fossa assessed on same-day brain MRI. Flow voids preserved within the imaged cervical vertebral arteries. No paraspinal mass or collection.   Disc levels:   Unless otherwise stated, the level by level findings below have not significantly changed from the prior MRI of 03/06/2017.   Disc degeneration at the non-operative levels, greatest at C3-C4 and C7-T1 (progressed at these levels).   C2-C3: Slight disc bulge. No significant spinal canal or foraminal stenosis.   C3-C4: Disc bulge. New superimposed small central disc protrusion (at site of posterior annular fissure). Uncovertebral hypertrophy on the right, new from the prior MRI. Facet arthrosis. Mild relative spinal canal narrowing (without spinal cord mass effect), new from the prior MRI. Moderate right neural foraminal narrowing, also new from the prior MRI.   C4-C5: Prior ACDF. No significant spinal canal or foraminal stenosis.   C5-C6: Prior ACDF. Uncovertebral hypertrophy on the right. No significant spinal canal stenosis. Mild right neural foraminal narrowing.   C6-C7: Prior ACDF. Uncovertebral vertebrae on the right. No significant spinal canal stenosis. Moderate right neural foraminal narrowing.   C7-T1: 3 mm grade 1 anterolisthesis. Mild disc uncovering. Facet arthrosis. No significant spinal canal stenosis. Mild-to-moderate bilateral neural foraminal narrowing.   IMPRESSION: Prior C4-C7 ACDF. No significant spinal canal stenosis at these levels. As before, uncovertebral hypertrophy results in neural foraminal narrowing on the right at C5-C6 (mild), and  on the right at C6-C7 (moderate).   Cervical spondylosis at the remaining levels, as outlined and with findings most notably as follows.   At C3-C4, there is a disc bulge. New superimposed small central disc protrusion (at site of posterior annular fissure). Uncovertebral hypertrophy on the right, new from the prior MRI. Facet arthrosis. Mild spinal canal narrowing, and moderate right neural foraminal narrowing, new from the prior MRI.   At C7-T1, there is 3 mm grade 1 anterolisthesis. Mild disc uncovering. Facet arthrosis. No significant spinal canal stenosis. Mild-to-moderate bilateral neural foraminal narrowing. Findings at this level have not significantly changed.  COGNITION: Overall cognitive status: Within functional limits for tasks assessed     SENSATION: Not tested  MUSCLE LENGTH: (not tested on 07/10/22) Mild restriction on B hamstrings Moderate restriction on B gastrocsoleus  POSTURE: rounded shoulders and forward head   LOWER EXTREMITY MMT  MMT Right eval Left eval Right 07/10/22 Left 07/10/22  Hip flexion 4 4 4 4   Hip extension 2+ (bridging) 2+  (bridging) 3+  (bridging) 3+ (bridging)  Hip abduction 4 4 4 4   Hip adduction      Hip internal rotation      Hip external rotation      Knee flexion 4- 4- 4 4  Knee extension 4-  3+ 4 4  Ankle dorsiflexion 3+ 4- 4- 4-  Ankle plantarflexion 3+ 3+ 3+ 3+  Ankle inversion      Ankle eversion       (Blank rows = not tested)  LOWER EXTREMITY ROM  Active ROM Right eval Left eval  Hip flexion [facility]  Hip extension    Hip abduction [facility]  Hip adduction [facility]  Hip internal rotation    Hip external rotation    Knee flexion [facility] WFL  Knee extension [facility]  Ankle dorsiflexion [facility] WFL  Ankle plantarflexion [facility] WFL  Ankle inversion    Ankle eversion     (Blank rows = not tested)  FUNCTIONAL TESTS:  5 times sit to stand: 18.88 sec (little assist from rollator) from 26.97 sec (has to hold on to  rollator) 2 minute walk test: 304 ft (with rollator) from 224 ft (with a rollator) Tinetti POMA : 14 from 9 (balance + gait)  GAIT: Distance walked: 304 ft Assistive device utilized:  rollator Level of assistance: Modified independence Comments: foot clearance on B more evident, normal step length on B  VESTIBULAR ASSESSMENT  06/21/22 07/10/22  Head impulse test positive with corrective saccades, dizziness reported positive with corrective saccades, dizziness reported  VOR 1 corrective saccades, dizziness reported corrective saccades, dizziness reported  Optokinetic reflex (R/L)  impaired, dizziness reported  impaired, dizziness reported    TODAY'S TREATMENT:                                                                                                                              DATE:  07/27/22 Sit to stand 2 x 10 no UE assist Sitting: Diaphragmatic breathing review education and performance  Standing: Heel taps x 10 each no UE assist 4" step ups x 5 each no UE assist  Gait training with large base quad cane with CGA x 225 ft   07/25/22 Gait with large base quad cane 300 feet with CGA STS 1 x 10 Anterior stepping with bilateral overhead reach 1 x 15 bilateral   07/23/22 Diaphragmatic breathing - education and performance - 5 minutes Step up 6 inch 2 x 10 bilateral unilateral HHA LAQ 10 x 5 second holds Standing hamstring curls 2# 2 x 10 Lateral stepping 3 x 8 feet bilateral with CGA Gait with large base quad cane 40 feet with CGA   07/20/2022 Cervical neck AROM flex/ext, rot x 10 Gastrocnemius slant board stretch x 30" x 3 Sit-to-stand, (emphasis on scooting, heel placement back, and trunk momentum), CGA x 10 Standing, firm surface, normal BOS:  Horizontal saccades, while reading words x 1'  Vertical saccades, while reading numbers x 1'  Horizontal smooth pursuit, identifying animal silhouettes x 1'  Vertical smooth pursuit, identifying/counting colors/shapes x  1'  Heel/toe raises, x 2 lbs no UE support x 10 x 2  Tandem stance x 30" x 2, eyes open, light UE support  Rhytmic stabilization x  ant/post x 3" x 10 x 2, no UE support  Rocking front/back, L arm swing, R LE forward x 30", R UE support  Mini squats 3" x 10 x 2, no UE support  TKE, GTB x 10 x 2 x 3"  Hip vectors x 10 x 2, GTB  07/19/2022 Cervical neck AROM flex/ext, rot x 10 Gastrocnemius slant board stretch x 30" x 3 Sit-to-stand, (emphasis on scooting, heel placement back, and trunk momentum), CGA x 10 Standing, firm surface, normal BOS:  Horizontal saccades, while reading words x 1'  Vertical saccades, while reading numbers x 1'  Horizontal smooth pursuit, identifying animal silhouettes x 1'  Vertical smooth pursuit, identifying/counting colors/shapes x 1'  VOR 1, own pace, identifying colors x 1'  VOR cancellation, reading numbers x 30"  Heel/toe raises, no UE support x 10 x 2  Tandem stance x 30" x 2, eyes open, light UE support  Rhytmic stabilization x ant/post x 3" x 10 x 2, no UE support  Rocking front/back, L arm swing, R LE forward x 30", R UE support  Mini squats 3" x 10 x 2, no UE support  TKE, GTB x 10 x 2 x 3"  Hip vectors x 10 x 2, GTB  07/17/22 Standing HR/TR 2 x 10 Step up 4 inch 1 x 10 bilateral UE support, 1 x 10 unilateral UE support bilateral  Rhytmic stabilization x ant/post and lateral x 3" x 10 x 1, no UE support Lateral stepping 4 x 8 feet bilateral CGA Hip vectors GTB at calf 2 x 5 bilateral  Sit-to-stand, seat slightly elevated (emphasis on scooting, heel placement back, and trunk momentum), CGA x 10; 5 x with emphasis on corkscrew of feet  07/13/2022 Cervical neck AROM flex/ext, rot x 10 Gastrocnemius slant board stretch x 30" x 3 Sit-to-stand, seat slightly elevated (emphasis on scooting, heel placement back, and trunk momentum), CGA x 10 Standing, firm surface, normal BOS:  Horizontal saccades, while reading words x 1'  Vertical saccades, while  reading numbers x 1'  Horizontal smooth pursuit, identifying animal silhouettes x 1'  Vertical smooth pursuit, identifying/counting colors/shapes x 1'  VOR 1, own pace, identifying colors x 1'  VOR cancellation, identifying shapes x 30"  Heel/toe raises, no UE support x 10 x 2  Tandem stance x 30" x 2, eyes open, light UE support Static standing, eyes open x 30" x 2, no UE support  Rhytmic stabilization x ant/post x 3" x 10 x 2, no UE support  Rocking front/back, L arm swing, R LE forward x 30", R UE support  Mini squats 3" x 10 x 2, no UE support  TKE, GTB x 10 x 2 x 3"  Hip vectors x 10 x 2, RTB  07/12/2022 Cervical neck AROM flex/ext, rot x 10 Gastrocnemius slant board stretch x 30" x 3 Sit-to-stand, seat slightly elevated (emphasis on scooting, heel placement back, and trunk momentum), CGA Standing, firm surface, normal BOS:  Horizontal saccades, while reading words x 1'  Vertical saccades, while reading words x 1'  Horizontal smooth pursuit, identifying animal silhouettes x 1'  Vertical smooth pursuit, identifying colors x 1'  VOR 1, own pace, identifying numbers x 1'  VOR cancellation, identifying shapes x 30"  Heel/toe raises with 1 UE support x 10 x 2  Tandem stance x 30" x 2, eyes open, light UE support Static standing, eyes open x 30" x 2 Static standing with head turns x 30" x 2  Rhytmic  stabilization x ant/post x 3" x 10 x 2  Mini squats 3" x 10 x 2, no UE support  TKE, RTB x 10 x 2 x 3"  Hip ext x 10 x 2, RTB  07/10/2022 Progress note ( , Tinetti, vestibular exam, ROM, MMT, 5TSTS) Cervical neck AROM flex/ext, rot x 10 Standing, firm surface, normal BOS:  Horizontal saccades, while reading words x 1'  Vertical saccades, while identifying numbers x 1'  Horizontal smooth pursuit, identifying color/shapes x 1'  Vertical smooth pursuit, identifying animal silhouettes x 1'  VOR 1, own pace, identifying numbers x 1'  07/05/2022 Seated:  Cervical neck AROM  flex/ext, rot x 10  Horizontal saccades, while reading words x 1'  Vertical saccades, while identifying numbers x 1'  Horizontal smooth pursuit, identifying color/shapes x 1'  Vertical smooth pursuit, identifying animal silhouettes x 1'  VOR 1, 70 bpm, identifying numbers x 1'  VOR cancellation, identifying colors x 1' Gastrocnemius slant board stretch x 30" x 3 Standing on firm surface, normal BOS:  Heel/toe raises with 1 UE support x 10 x 2  Tandem stance x 30" x 2, eyes open, light UE support Static standing, eyes open x 30" x 2 Static standing with head turns x 30" x 2  Rhytmic stabilization x ant/post x 3" x 10 x 2  Mini squats 3" x 10 x 2, no UE support  TKE, RTB x 10 x 2 x 3"  Hip ext x 10 x 2  07/02/22 Sitting: AROM of the cervical spine flexion/extension, rotation x 10 Gastroc stretch with strap 5 x 20" each  Standing: Heel/toe raises 2 x 10 Hip abduction and extension 2 x 10 each Tandem stance 2 x 30" fingertip support Small base of support standing 3 x 30" no UE support Shoulder width stance with light support with head turns and nods x 10 each  06/28/2022 Seated:  Cervical neck AROM flex/ext, rot x 10  Gastrocnemius strap stretch x 30" x 3  Horizontal saccades, while identifying numbers x 1'  Vertical saccades, while reading words x 1'  Horizontal smooth pursuit, identifying animal silhouettes x 1'  Vertical smooth pursuit, identifying colors/shapes x 1'  VOR 1, 70 bpm, identifying numbers x 1'  VOR cancellation, reading words x 1' Gastrocnemius slant board stretch x 30" x 3 Standing on firm surface, normal BOS:  Heel/toe raises with 1 UE support x 10 x 2  Tandem stance x 30" x 2, eyes open, light UE support Static standing, eyes open x 30"  Static standing with head turns x 30"  Rhytmic stabilization x ant/post x 3" x 10 x 2  Mini squats 3" x 10 x 2, no UE support  TKE, RTB x 10 x 2 x 3"  Hip ext x 10 x 2  06/27/2022 Seated:  Cervical neck AROM  flex/ext, rot x 10  Gastrocnemius strap stretch x 30" x 3  Horizontal saccades, while identifying numbers x 1'  Vertical saccades, while reading words x 1'  Horizontal smooth pursuit, identifying animal silhouettes x 1'  Vertical smooth pursuit, identifying colors/shapes x 1'  VOR 1, 70 bpm, identifying numbers x 1'  VOR cancellation, reading words x 1' Standing on firm surface, normal BOS:  Heel/toe raises with 1 UE support x 10 x 2  Tandem stance x 30" x 3, eyes open, light UE support Static standing, eyes open x 30"  Static standing with head turns x 30"  Rhytmic stabilization x ant/post x 3" x 10  06/21/2022 Seated:  Cervical neck AROM flex/ext, rot x 10  Horizontal saccades, while identifying numbers x 1'  Vertical saccades, while reading words x 1'  Horizontal smooth pursuit, identifying animal silhouettes x 1'  Vertical smooth pursuit, identifying colors/shapes x 1'  VOR 1, 60 bpm, identifying numbers x 1'  VOR cancellation, identifying colors x 1'   PATIENT EDUCATION:  Education details: Updated HEP. Instructed patient to perform standing exercises on with a stable support (e.g. kitchen counter) and have her husband next to her. Educated on possible anxiety related/multiple systems contributing to symptoms Person educated: Patient Education method: Explanation Education comprehension: verbalized understanding  HOME EXERCISE PROGRAM: Access Code: [email or web address] URL: [email or web address]  07/25/22 - Forward Step with Same Side Arm Raise  - 1 x daily - 7 x weekly - 2 sets - 10 reps  07/23/22- Supine Diaphragmatic Breathing  - 1 x daily - 7 x weekly - 10 reps - Seated Diaphragmatic Breathing  - 1 x daily - 7 x weekly - 10 reps - Step Up  - 1-2 x daily - 7 x weekly - 2 sets - 10 reps  07/17/22 - Sit to Stand with Arms Crossed  - 1 x daily - 7 x weekly - 1-2 sets - 10 reps  07/13/22: - Standing 3-Way Leg Reach with Resistance at Ankles and Counter Support  - 1 x  daily - 7 x weekly - 2 sets - 10 reps  07/02/22 hip abduction, small bos standing and regular standing head turns and nods  06/28/2022 - Mini Squat with Counter Support  - 1-2 x daily - 5-7 x weekly - 2 sets - 10 reps - 3 hold - Standing Hip Extension with Counter Support  - 1-2 x daily - 5-7 x weekly - 2 sets - 10 reps  Date: 06/27/2022 Prepared by: Krystal Clark  Exercises - Seated Calf Stretch with Strap  - 1-2 x daily - 5-7 x weekly - 3 reps - 30 hold - Standing Tandem Balance with Counter Support  - 1-2 x daily - 5-7 x weekly - 30 hold - Heel Toe Raises with Counter Support  - 1-2 x daily - 5-7 x weekly - 2 sets - 10 reps  06/21/2022 GAZE STABILIZATION EXERCISES (2-3x/day, 5-7 days/week, seated) These exercises are designed to improve your eyes' ability to track objects without getting dizzy. When performing these exercises, make sure you are facing a blank wall (like a white wall) to avoid distraction. You can wear your glasses/contact lenses if you must.   Before anything, warm-up your neck muscles by bending it forward and back 10 times, side-to-side 10 times, and slowly turn your head left and right 10 time Exercise 1 (Horizontal Saccades) Use two cards for this exercise While holding the cards on each hand side by side in sitting with arms outstretched, look at the first item on the other card then look at the next item on the second card then alternate your gaze sequentially. Do NOT move your cards nor the your head. Do the following for one minute each Identifying numbers Identifying shapes Exercise 2 (Par[employer] x1) Use only one card for this exercise While holding the card in sitting with your arm outstretched, turn your head left and right at your own pace while keeping the card stationary.  Do this for one minute each while you are: Identifying numbers Identifying shapes Exercise 3 (VOR cancellation) Use only one card for this exercise While holding the card in  sitting with your  arms outstretched, turn your body, arm, and head at the same time left and right at your own pace Do this for one minute each while you are: Identifying numbers Identifying shapes  3/11/20204 - VOR x 1 Habituation Exercises 3x daily - Sit/stands w/ 4.5lb weight at home 2 x 5 daily.  ASSESSMENT:  CLINICAL IMPRESSION:  Patient continues to need CGA for safety for standing balance activity; needs multiple cues to look up with ambulation with QC as she tends to look at her feet/ the floor.  Overall making progress .  Patient will continue to benefit from physical therapy in order to improve function and reduce impairment.   PROGRESS NOTE 07/10/22: Patient demonstrated continued improvements in function as indicated by positive significant changes in , 5TSTS, and strength. However, patient still presents with deficits in vestibular function which greatly affects her standing balance. Patient still presents as a falls risk based on the Tinetti score. With this, skilled PT is still required to address the impairments and functional limitations listed below.  Interventions today were geared towards gaze stabilization incorporated with her standing balance. Patient tolerated gaze stabilization activities with mild dizziness and unsteadiness on the saccades, VOR 1 and cancellation exercises. Demonstrated appropriate levels of fatigue. Rest periods provided. Provided slight amount of cueing to ensure correct execution of activity with good carry-over.    OBJECTIVE IMPAIRMENTS: Abnormal gait, decreased balance, decreased coordination, decreased endurance, difficulty walking, decreased strength, impaired flexibility, and pain.   ACTIVITY LIMITATIONS: carrying, lifting, bending, standing, squatting, stairs, transfers, continence, bathing, toileting, dressing, self feeding, hygiene/grooming, and locomotion level  PARTICIPATION LIMITATIONS: meal prep, cleaning, laundry, driving, shopping,  community activity, and yard work  PERSONAL FACTORS: Fitness level, Bladder implant (will have surgery 07/26/2022), hx of B THR, lobectomy are also affecting patient's functional outcome.   REHAB POTENTIAL: Fair    CLINICAL DECISION MAKING: Evolving/moderate complexity  EVALUATION COMPLEXITY: Moderate   GOALS: Goals reviewed with patient? Yes  SHORT TERM GOALS: Target date: 07/13/2022  Pt will demonstrate indep in HEP to facilitate carry-over of skilled services and improve functional outcomes Goal status: MET  LONG TERM GOALS: Target date: 08/10/2022  Pt will be report little to no dizziness on VOR 1 and head impulse test to facilitate safety on ambulation Baseline: with dizziness Goal status: INITIAL/NEW   Pt will decrease 5TSTS by at least 5 seconds in order to demonstrate clinically significant improvement in LE strength  Baseline: 18.88 sec Goal status: REVISED  3.  Patient will demonstrate increase in Tinetti Score by 8 points in order to demonstrate clinically significant improvement in balance and decreased risk for falls  Baseline: 9 Goal status: IN PROGRESS  4.  Pt will increase by at least 40 ft in order to demonstrate clinically significant improvement in community ambulation Baseline: 304 ft Goal status: REVISED  5.  Pt will demonstrate increase in LE strength to 4 to facilitate ease and safety in ambulation Baseline: 2+ Goal status: IN PROGRESS  6.  Pt will demonstrate improved LE coordination as manifested by performing 3/4 correct trials in heel-to-shin to facilitate ease in ambulation Baseline: impaired Goal status: INITIAL  7.  Pt will be able to walk with a quad cane for > 50 ft Baseline: walks with a rollator Goal status: REVISED   PLAN:  PT FREQUENCY: 3x/week  PT DURATION: 4 weeks  PLANNED INTERVENTIONS: Therapeutic exercises, Therapeutic activity, Neuromuscular re-education, Balance training, Gait training, Patient/Family education,  Self Care, and Stair training  PLAN FOR NEXT  SESSION: Continue POC and may progress as tolerated with emphasis on balance, LE strengthening, flexibility, and vestibular function.   3:18 PM, 07/27/22 Nakiya Rallis Small Enoch Moffa MPT [facility] [phone]

## 2022-07-30 ENCOUNTER — Ambulatory Visit (HOSPITAL_COMMUNITY): Payer: Medicare Other

## 2022-07-30 DIAGNOSIS — R262 Difficulty in walking, not elsewhere classified: Secondary | ICD-10-CM

## 2022-07-30 DIAGNOSIS — M25551 Pain in right hip: Secondary | ICD-10-CM

## 2022-07-30 NOTE — Therapy (Signed)
OUTPATIENT PHYSICAL THERAPY LOWER EXTREMITY TREATMENT NOTE   Patient Name: [name] MRN: [id number] DOB:[birth year], 57 y.o., female Today's Date: 07/30/2022  END OF SESSION:  PT End of Session - 07/30/22 1352     Visit Number 17    Number of Visits 20    Date for PT Re-Evaluation 08/10/22    Authorization Type BCBS Medicare, Medicaid of [place]    Progress Note Due on Visit 20    PT Start Time 1350    PT Stop Time 1429    PT Time Calculation (min) 39 min    Equipment Utilized During Treatment Gait belt;Oxygen    Activity Tolerance Patient tolerated treatment well    Behavior During Therapy WFL for tasks assessed/performed            Past Surgical History:  Procedure Laterality Date   ABDOMINAL HYSTERECTOMY     with bladder suspension   CERVICAL FUSION     CHOLECYSTECTOMY     COLONOSCOPY N/A 07/30/2013   Procedure: COLONOSCOPY;  Surgeon: Malissa Hippo, MD;  Location: [facility];  Service: Endoscopy;  Laterality: N/A;  930   EXAM UNDER ANESTHESIA WITH MANIPULATION OF SHOULDER Right 10/11/2016   Procedure: EXAM UNDER ANESTHESIA WITH MANIPULATION OF SHOULDER;  Surgeon: Vickki Hearing, MD;  Location: [facility];  Service: Orthopedics;  Laterality: Right;   HERNIA REPAIR     INCISIONAL HERNIA REPAIR N/A 07/30/2016   Procedure: HERNIA REPAIR INCISIONAL WITH MESH;  Surgeon: [name], MD;  Location: [facility];  Service: General;  Laterality: N/A;   INTERSTIM IMPLANT PLACEMENT  02/02/2021   Procedure: Leane Platt IMPLANT FIRST STAGE LEFT SACRUM ;  Surgeon: Malen Gauze, MD;  Location: [facility];  Service: Urology;;   Leane Platt IMPLANT PLACEMENT N/A 02/23/2021   Procedure: Leane Platt IMPLANT SECOND STAGE;  Surgeon: Malen Gauze, MD;  Location: [facility];  Service: Urology;  Laterality: N/A;  Rep coming, time needs to stay at 2:00   JOINT REPLACEMENT     LOBECTOMY     right lung    POSTERIOR CERVICAL LAMINECTOMY Right 04/05/2017   Procedure: Right C6-7 Posterior  cervical laminectomy;  Surgeon: Donalee Citrin, MD;  Location: [facility] OR;  Service: Neurosurgery;  Laterality: Right;  Right C6-7 Posterior cervical laminectomy   SHOULDER ARTHROSCOPY WITH ROTATOR CUFF REPAIR Right 10/11/2016   Procedure: SHOULDER ARTHROSCOPY;  Surgeon: Vickki Hearing, MD;  Location: [facility];  Service: Orthopedics;  Laterality: Right;   TOTAL HIP ARTHROPLASTY Right 04/15/2020   Procedure: RIGHT TOTAL HIP ARTHROPLASTY ANTERIOR APPROACH;  Surgeon: Kathryne Hitch, MD;  Location: [facility];  Service: Orthopedics;  Laterality: Right;   TOTAL HIP ARTHROPLASTY Left 07/22/2020   Procedure: LEFT  HIP ARTHROPLASTY ANTERIOR APPROACH;  Surgeon: Kathryne Hitch, MD;  Location: [facility];  Service: Orthopedics;  Laterality: Left;  RNFA   TUBAL LIGATION     Patient Active Problem List   Diagnosis Date Noted   Severe persistent asthma without complication 06/06/2022   Gait abnormality 05/28/2022   Adrenal insufficiency 03/30/2022   Endocrine disorder, unspecified 07/04/2021   Prediabetes 07/04/2021   Hypothyroidism 07/04/2021   Former smoker 06/29/2021   Seasonal and perennial allergic rhinitis 06/19/2021   Oxygen dependent 06/19/2021   Steroid dependent 06/19/2021   Immunosuppressed status 06/19/2021   Radiculopathy, lumbar region 12/08/2020   Status post left hip replacement 07/22/2020   Status post right hip replacement 05/02/2020   Status post total replacement of right hip 04/15/2020   Avascular necrosis  of bone of left hip 02/10/2020   Avascular necrosis of bone of right hip 02/10/2020   Allergic rhinitis 05/21/2019   Asthma-COPD overlap syndrome 04/30/2019   Chronic respiratory failure with hypoxia 04/30/2019   Spinal stenosis of cervical region 04/05/2017   Partial tear of right subscapularis tendon    Incisional hernia, without obstruction or gangrene    Urinary frequency 03/21/2012   Migraine equivalent syndrome 03/21/2012   Insomnia 03/21/2012    PCP: Benita Stabile, MD  REFERRING PROVIDER: Levert Feinstein, MD  REFERRING DIAG: R26.9 (ICD-10-CM) - Gait abnormality  THERAPY DIAG:  Difficulty in walking, not elsewhere classified  Pain in right hip  Rationale for Evaluation and Treatment: Rehabilitation  ONSET DATE: April 12, 2022  SUBJECTIVE:   SUBJECTIVE STATEMENT: Leg soreness is pretty much gone; did ok over the weekend.  Has to have a surgery for implanted stimulator at sometime in the future; damaged it when she fell a couple times.   Will see an ENT/ Dr. Suszanne Conners May 1st; has some bruising on her arms but states that she just barely bumps something and it causes her to bruise easily.    PROGRESS NOTE 07/10/22: Patient states that she went to her neurologist and was diagnosed with vestibular neuritis. Patient says she will be referred to a neurologist. Patient states that she's still dizzy ("wavy" sensation) but not as bad as she used to be. Patient states that her balance is still off but has improved a little since she started PT. Denies any recent falls. Patient thinks she has improved around 25%. With this, patient wishes to continue with PT.   PERTINENT HISTORY: Bladder implant (will have surgery 07/26/2022), hx of B THR, lobectomy PAIN:  Are you having pain? No  PRECAUTIONS: Fall and Other: uses portable O2  WEIGHT BEARING RESTRICTIONS: No  FALLS:  Has patient fallen in last 6 months? Yes. Number of falls 7  LIVING ENVIRONMENT: Lives with: lives with their spouse Lives in: House/apartment Stairs: Yes: External: 1 steps; none Has following equipment at home: Single point cane, Walker - 2 wheeled, Environmental consultant - 4 wheeled, shower chair, bed side commode, and oxygen  OCCUPATION: on disability  PLOF: Independent with household mobility with device and Requires assistive device for independence  PATIENT GOALS: "to be able to walk straight by myself"  NEXT MD VISIT: Aug 30, 2022  OBJECTIVE:   DIAGNOSTIC FINDINGS:  MRI HEAD WITHOUT  CONTRAST 06/08/22   TECHNIQUE: Multiplanar, multiecho pulse sequences of the brain and surrounding structures were obtained without intravenous contrast.   COMPARISON:  Head CT 05/22/2022. Brain MRI 05/22/2011.   FINDINGS: Brain:   No age advanced or lobar predominant parenchymal atrophy.   Multifocal T2 FLAIR hyperintense signal abnormality within the cerebral white matter, overall mild but greater than expected for age.   There is no acute infarct.   No evidence of an intracranial mass.   No chronic intracranial blood products.   No extra-axial fluid collection.   No midline shift.   Vascular: Maintained flow voids within the proximal large arterial vessels.   Skull and upper cervical spine: No focal suspicious calvarial marrow lesion. Cervical spine findings separately reported on same day cervical spine MRI.   Sinuses/Orbits: No mass or acute finding within the imaged orbits. Prior bilateral ocular lens replacement. Trace mucosal thickening within left ethmoid air cells.   Other: Small-volume fluid within the bilateral mastoid air cells.   IMPRESSION: 1.  No evidence of an acute intracranial abnormality. 2.  Multifocal T2 FLAIR hyperintense signal abnormality within the cerebral white matter, overall mild but greater than expected for age. These signal changes have slightly progressed from the prior brain MRI of 05/22/2011. Findings are nonspecific and differential considerations include chronic small vessel ischemic disease, sequelae of chronic migraine headaches, sequelae of a prior infectious/inflammatory process or sequelae of a demyelinating process (such as multiple sclerosis), among others. No lesions specifically suggest demyelinating disease. 3. Small-volume fluid within the bilateral mastoid air cells.  MRI CERVICAL SPINE WITHOUT CONTRAST 06/08/22   TECHNIQUE: Multiplanar, multisequence MR imaging of the cervical spine was performed. No intravenous  contrast was administered.   COMPARISON:  Radiographs of the cervical spine 08/06/2017. Cervical spine MRI 02/24/2017.   FINDINGS: Alignment: Straightening of the expected cervical lordosis. 3 mm C7-T1 grade 1 anterolisthesis.   Vertebrae: Vertebral body height is maintained. Susceptibility artifact arising from ACDF hardware at the C4-C7 levels. No appreciable significant marrow edema or focal suspicious osseous lesion.   Cord: No signal abnormality identified within the cervical spinal cord.   Posterior Fossa, vertebral arteries, paraspinal tissues: Posterior fossa assessed on same-day brain MRI. Flow voids preserved within the imaged cervical vertebral arteries. No paraspinal mass or collection.   Disc levels:   Unless otherwise stated, the level by level findings below have not significantly changed from the prior MRI of 03/06/2017.   Disc degeneration at the non-operative levels, greatest at C3-C4 and C7-T1 (progressed at these levels).   C2-C3: Slight disc bulge. No significant spinal canal or foraminal stenosis.   C3-C4: Disc bulge. New superimposed small central disc protrusion (at site of posterior annular fissure). Uncovertebral hypertrophy on the right, new from the prior MRI. Facet arthrosis. Mild relative spinal canal narrowing (without spinal cord mass effect), new from the prior MRI. Moderate right neural foraminal narrowing, also new from the prior MRI.   C4-C5: Prior ACDF. No significant spinal canal or foraminal stenosis.   C5-C6: Prior ACDF. Uncovertebral hypertrophy on the right. No significant spinal canal stenosis. Mild right neural foraminal narrowing.   C6-C7: Prior ACDF. Uncovertebral vertebrae on the right. No significant spinal canal stenosis. Moderate right neural foraminal narrowing.   C7-T1: 3 mm grade 1 anterolisthesis. Mild disc uncovering. Facet arthrosis. No significant spinal canal stenosis. Mild-to-moderate bilateral neural  foraminal narrowing.   IMPRESSION: Prior C4-C7 ACDF. No significant spinal canal stenosis at these levels. As before, uncovertebral hypertrophy results in neural foraminal narrowing on the right at C5-C6 (mild), and on the right at C6-C7 (moderate).   Cervical spondylosis at the remaining levels, as outlined and with findings most notably as follows.   At C3-C4, there is a disc bulge. New superimposed small central disc protrusion (at site of posterior annular fissure). Uncovertebral hypertrophy on the right, new from the prior MRI. Facet arthrosis. Mild spinal canal narrowing, and moderate right neural foraminal narrowing, new from the prior MRI.   At C7-T1, there is 3 mm grade 1 anterolisthesis. Mild disc uncovering. Facet arthrosis. No significant spinal canal stenosis. Mild-to-moderate bilateral neural foraminal narrowing. Findings at this level have not significantly changed.  COGNITION: Overall cognitive status: Within functional limits for tasks assessed     SENSATION: Not tested  MUSCLE LENGTH: (not tested on 07/10/22) Mild restriction on B hamstrings Moderate restriction on B gastrocsoleus  POSTURE: rounded shoulders and forward head   LOWER EXTREMITY MMT  MMT Right eval Left eval Right 07/10/22 Left 07/10/22  Hip flexion Hip extension 2+ (bridging) 2+  (bridging)  3+  (bridging) 3+ (bridging)  Hip abduction 4 4 4 4   Hip adduction      Hip internal rotation      Hip external rotation      Knee flexion 4- 4- 4 4  Knee extension 4- 3+ 4 4  Ankle dorsiflexion 3+ 4- 4- 4-  Ankle plantarflexion 3+ 3+ 3+ 3+  Ankle inversion      Ankle eversion       (Blank rows = not tested)  LOWER EXTREMITY ROM  Active ROM Right eval Left eval  Hip flexion [facility]  Hip extension    Hip abduction [facility]  Hip adduction [facility]  Hip internal rotation    Hip external rotation    Knee flexion [facility] WFL  Knee extension [facility]  Ankle dorsiflexion [facility] WFL  Ankle  plantarflexion [facility] WFL  Ankle inversion    Ankle eversion     (Blank rows = not tested)  FUNCTIONAL TESTS:  5 times sit to stand: 18.88 sec (little assist from rollator) from 26.97 sec (has to hold on to rollator) 2 minute walk test: 304 ft (with rollator) from 224 ft (with a rollator) Tinetti POMA : 14 from 9 (balance + gait)  GAIT: Distance walked: 304 ft Assistive device utilized:  rollator Level of assistance: Modified independence Comments: foot clearance on B more evident, normal step length on B  VESTIBULAR ASSESSMENT  06/21/22 07/10/22  Head impulse test positive with corrective saccades, dizziness reported positive with corrective saccades, dizziness reported  VOR 1 corrective saccades, dizziness reported corrective saccades, dizziness reported  Optokinetic reflex (R/L)  impaired, dizziness reported  impaired, dizziness reported    TODAY'S TREATMENT:                                                                                                                              DATE:  07/30/22 Sit to stand x 10 no UE assist Sit to stand holding green med ball 1 x 10  Sit to stand holding red med ball x 10 Standing: 8" step ups x 10 each with intermittent 2 hand assist Tandem stance on foam 2 x 10" each Heel/toe raises on incline with no UE assist x 10 Small base of support x 30" no UE assist  Ambulation with SBQC x 230 ft with CGA   07/27/22 Sit to stand 2 x 10 no UE assist Sitting: Diaphragmatic breathing review education and performance  Standing: Heel taps x 10 each no UE assist 4" step ups x 5 each no UE assist  Gait training with large base quad cane with CGA x 225 ft   07/25/22 Gait with large base quad cane 300 feet with CGA STS 1 x 10 Anterior stepping with bilateral overhead reach 1 x 15 bilateral   07/23/22 Diaphragmatic breathing - education and performance - 5 minutes Step up 6 inch 2 x 10 bilateral unilateral HHA LAQ 10 x 5 second holds  Standing  hamstring curls 2# 2 x 10 Lateral stepping 3 x 8 feet bilateral with CGA Gait with large base quad cane 40 feet with CGA   07/20/2022 Cervical neck AROM flex/ext, rot x 10 Gastrocnemius slant board stretch x 30" x 3 Sit-to-stand, (emphasis on scooting, heel placement back, and trunk momentum), CGA x 10 Standing, firm surface, normal BOS:  Horizontal saccades, while reading words x 1'  Vertical saccades, while reading numbers x 1'  Horizontal smooth pursuit, identifying animal silhouettes x 1'  Vertical smooth pursuit, identifying/counting colors/shapes x 1'  Heel/toe raises, x 2 lbs no UE support x 10 x 2  Tandem stance x 30" x 2, eyes open, light UE support  Rhytmic stabilization x ant/post x 3" x 10 x 2, no UE support  Rocking front/back, L arm swing, R LE forward x 30", R UE support  Mini squats 3" x 10 x 2, no UE support  TKE, GTB x 10 x 2 x 3"  Hip vectors x 10 x 2, GTB  07/19/2022 Cervical neck AROM flex/ext, rot x 10 Gastrocnemius slant board stretch x 30" x 3 Sit-to-stand, (emphasis on scooting, heel placement back, and trunk momentum), CGA x 10 Standing, firm surface, normal BOS:  Horizontal saccades, while reading words x 1'  Vertical saccades, while reading numbers x 1'  Horizontal smooth pursuit, identifying animal silhouettes x 1'  Vertical smooth pursuit, identifying/counting colors/shapes x 1'  VOR 1, own pace, identifying colors x 1'  VOR cancellation, reading numbers x 30"  Heel/toe raises, no UE support x 10 x 2  Tandem stance x 30" x 2, eyes open, light UE support  Rhytmic stabilization x ant/post x 3" x 10 x 2, no UE support  Rocking front/back, L arm swing, R LE forward x 30", R UE support  Mini squats 3" x 10 x 2, no UE support  TKE, GTB x 10 x 2 x 3"  Hip vectors x 10 x 2, GTB  07/17/22 Standing HR/TR 2 x 10 Step up 4 inch 1 x 10 bilateral UE support, 1 x 10 unilateral UE support bilateral  Rhytmic stabilization x ant/post and lateral x 3" x 10 x 1, no  UE support Lateral stepping 4 x 8 feet bilateral CGA Hip vectors GTB at calf 2 x 5 bilateral  Sit-to-stand, seat slightly elevated (emphasis on scooting, heel placement back, and trunk momentum), CGA x 10; 5 x with emphasis on corkscrew of feet  07/13/2022 Cervical neck AROM flex/ext, rot x 10 Gastrocnemius slant board stretch x 30" x 3 Sit-to-stand, seat slightly elevated (emphasis on scooting, heel placement back, and trunk momentum), CGA x 10 Standing, firm surface, normal BOS:  Horizontal saccades, while reading words x 1'  Vertical saccades, while reading numbers x 1'  Horizontal smooth pursuit, identifying animal silhouettes x 1'  Vertical smooth pursuit, identifying/counting colors/shapes x 1'  VOR 1, own pace, identifying colors x 1'  VOR cancellation, identifying shapes x 30"  Heel/toe raises, no UE support x 10 x 2  Tandem stance x 30" x 2, eyes open, light UE support Static standing, eyes open x 30" x 2, no UE support  Rhytmic stabilization x ant/post x 3" x 10 x 2, no UE support  Rocking front/back, L arm swing, R LE forward x 30", R UE support  Mini squats 3" x 10 x 2, no UE support  TKE, GTB x 10 x 2 x 3"  Hip vectors x 10 x 2, RTB  07/12/2022  Cervical neck AROM flex/ext, rot x 10 Gastrocnemius slant board stretch x 30" x 3 Sit-to-stand, seat slightly elevated (emphasis on scooting, heel placement back, and trunk momentum), CGA Standing, firm surface, normal BOS:  Horizontal saccades, while reading words x 1'  Vertical saccades, while reading words x 1'  Horizontal smooth pursuit, identifying animal silhouettes x 1'  Vertical smooth pursuit, identifying colors x 1'  VOR 1, own pace, identifying numbers x 1'  VOR cancellation, identifying shapes x 30"  Heel/toe raises with 1 UE support x 10 x 2  Tandem stance x 30" x 2, eyes open, light UE support Static standing, eyes open x 30" x 2 Static standing with head turns x 30" x 2  Rhytmic stabilization x ant/post x 3"  x 10 x 2  Mini squats 3" x 10 x 2, no UE support  TKE, RTB x 10 x 2 x 3"  Hip ext x 10 x 2, RTB  07/10/2022 Progress note ( , Tinetti, vestibular exam, ROM, MMT, 5TSTS) Cervical neck AROM flex/ext, rot x 10 Standing, firm surface, normal BOS:  Horizontal saccades, while reading words x 1'  Vertical saccades, while identifying numbers x 1'  Horizontal smooth pursuit, identifying color/shapes x 1'  Vertical smooth pursuit, identifying animal silhouettes x 1'  VOR 1, own pace, identifying numbers x 1'  07/05/2022 Seated:  Cervical neck AROM flex/ext, rot x 10  Horizontal saccades, while reading words x 1'  Vertical saccades, while identifying numbers x 1'  Horizontal smooth pursuit, identifying color/shapes x 1'  Vertical smooth pursuit, identifying animal silhouettes x 1'  VOR 1, 70 bpm, identifying numbers x 1'  VOR cancellation, identifying colors x 1' Gastrocnemius slant board stretch x 30" x 3 Standing on firm surface, normal BOS:  Heel/toe raises with 1 UE support x 10 x 2  Tandem stance x 30" x 2, eyes open, light UE support Static standing, eyes open x 30" x 2 Static standing with head turns x 30" x 2  Rhytmic stabilization x ant/post x 3" x 10 x 2  Mini squats 3" x 10 x 2, no UE support  TKE, RTB x 10 x 2 x 3"  Hip ext x 10 x 2      PATIENT EDUCATION:  Education details: Updated HEP. Instructed patient to perform standing exercises on with a stable support (e.g. kitchen counter) and have her husband next to her. Educated on possible anxiety related/multiple systems contributing to symptoms Person educated: Patient Education method: Explanation Education comprehension: verbalized understanding  HOME EXERCISE PROGRAM: Access Code: [email or web address] URL: [email or web address]  07/25/22 - Forward Step with Same Side Arm Raise  - 1 x daily - 7 x weekly - 2 sets - 10 reps  07/23/22- Supine Diaphragmatic Breathing  - 1 x daily - 7 x weekly - 10 reps - Seated  Diaphragmatic Breathing  - 1 x daily - 7 x weekly - 10 reps - Step Up  - 1-2 x daily - 7 x weekly - 2 sets - 10 reps  07/17/22 - Sit to Stand with Arms Crossed  - 1 x daily - 7 x weekly - 1-2 sets - 10 reps  07/13/22: - Standing 3-Way Leg Reach with Resistance at Ankles and Counter Support  - 1 x daily - 7 x weekly - 2 sets - 10 reps  07/02/22 hip abduction, small bos standing and regular standing head turns and nods  06/28/2022 - Mini Squat with Counter Support  - 1-2 x daily - 5-7  x weekly - 2 sets - 10 reps - 3 hold - Standing Hip Extension with Counter Support  - 1-2 x daily - 5-7 x weekly - 2 sets - 10 reps  Date: 06/27/2022 Prepared by: Krystal Clark  Exercises - Seated Calf Stretch with Strap  - 1-2 x daily - 5-7 x weekly - 3 reps - 30 hold - Standing Tandem Balance with Counter Support  - 1-2 x daily - 5-7 x weekly - 30 hold - Heel Toe Raises with Counter Support  - 1-2 x daily - 5-7 x weekly - 2 sets - 10 reps  06/21/2022 GAZE STABILIZATION EXERCISES (2-3x/day, 5-7 days/week, seated) These exercises are designed to improve your eyes' ability to track objects without getting dizzy. When performing these exercises, make sure you are facing a blank wall (like a white wall) to avoid distraction. You can wear your glasses/contact lenses if you must.   Before anything, warm-up your neck muscles by bending it forward and back 10 times, side-to-side 10 times, and slowly turn your head left and right 10 time Exercise 1 (Horizontal Saccades) Use two cards for this exercise While holding the cards on each hand side by side in sitting with arms outstretched, look at the first item on the other card then look at the next item on the second card then alternate your gaze sequentially. Do NOT move your cards nor the your head. Do the following for one minute each Identifying numbers Identifying shapes Exercise 2 (Paradigm x1) Use only one card for this exercise While holding the card in  sitting with your arm outstretched, turn your head left and right at your own pace while keeping the card stationary.  Do this for one minute each while you are: Identifying numbers Identifying shapes Exercise 3 (VOR cancellation) Use only one card for this exercise While holding the card in sitting with your arms outstretched, turn your body, arm, and head at the same time left and right at your own pace Do this for one minute each while you are: Identifying numbers Identifying shapes  3/11/20204 - VOR x 1 Habituation Exercises 3x daily - Sit/stands w/ 4.5lb weight at home 2 x 5 daily.  ASSESSMENT:  CLINICAL IMPRESSION: Continued to work on balance; functional movement, gait training today.  Increased step height with step ups and added foam for tandem stance with good challenge.  Noted improving gait pattern, increased speed and looking up more consistently without cues using QC.     Patient will continue to benefit from physical therapy in order to improve function and reduce impairment.   PROGRESS NOTE 07/10/22: Patient demonstrated continued improvements in function as indicated by positive significant changes in , 5TSTS, and strength. However, patient still presents with deficits in vestibular function which greatly affects her standing balance. Patient still presents as a falls risk based on the Tinetti score. With this, skilled PT is still required to address the impairments and functional limitations listed below.  Interventions today were geared towards gaze stabilization incorporated with her standing balance. Patient tolerated gaze stabilization activities with mild dizziness and unsteadiness on the saccades, VOR 1 and cancellation exercises. Demonstrated appropriate levels of fatigue. Rest periods provided. Provided slight amount of cueing to ensure correct execution of activity with good carry-over.    OBJECTIVE IMPAIRMENTS: Abnormal gait, decreased balance, decreased  coordination, decreased endurance, difficulty walking, decreased strength, impaired flexibility, and pain.   ACTIVITY LIMITATIONS: carrying, lifting, bending, standing, squatting, stairs, transfers, continence, bathing, toileting, dressing, self  feeding, hygiene/grooming, and locomotion level  PARTICIPATION LIMITATIONS: meal prep, cleaning, laundry, driving, shopping, community activity, and yard work  PERSONAL FACTORS: Fitness level, Bladder implant (will have surgery 07/26/2022), hx of B THR, lobectomy are also affecting patient's functional outcome.   REHAB POTENTIAL: Fair    CLINICAL DECISION MAKING: Evolving/moderate complexity  EVALUATION COMPLEXITY: Moderate   GOALS: Goals reviewed with patient? Yes  SHORT TERM GOALS: Target date: 07/13/2022  Pt will demonstrate indep in HEP to facilitate carry-over of skilled services and improve functional outcomes Goal status: MET  LONG TERM GOALS: Target date: 08/10/2022  Pt will be report little to no dizziness on VOR 1 and head impulse test to facilitate safety on ambulation Baseline: with dizziness Goal status: INITIAL/NEW   Pt will decrease 5TSTS by at least 5 seconds in order to demonstrate clinically significant improvement in LE strength  Baseline: 18.88 sec Goal status: REVISED  3.  Patient will demonstrate increase in Tinetti Score by 8 points in order to demonstrate clinically significant improvement in balance and decreased risk for falls  Baseline: 9 Goal status: IN PROGRESS  4.  Pt will increase by at least 40 ft in order to demonstrate clinically significant improvement in community ambulation Baseline: 304 ft Goal status: REVISED  5.  Pt will demonstrate increase in LE strength to 4 to facilitate ease and safety in ambulation Baseline: 2+ Goal status: IN PROGRESS  6.  Pt will demonstrate improved LE coordination as manifested by performing 3/4 correct trials in heel-to-shin to facilitate ease in  ambulation Baseline: impaired Goal status: INITIAL  7.  Pt will be able to walk with a quad cane for > 50 ft Baseline: walks with a rollator Goal status: REVISED   PLAN:  PT FREQUENCY: 3x/week  PT DURATION: 4 weeks  PLANNED INTERVENTIONS: Therapeutic exercises, Therapeutic activity, Neuromuscular re-education, Balance training, Gait training, Patient/Family education, Self Care, and Stair training  PLAN FOR NEXT SESSION: Continue POC and may progress as tolerated with emphasis on balance, LE strengthening, flexibility, and vestibular function.   2:37 PM, 07/30/22 Naamah Boggess Small Jenise Iannelli MPT [facility] [place] [phone]

## 2022-07-31 ENCOUNTER — Other Ambulatory Visit: Payer: Self-pay

## 2022-07-31 DIAGNOSIS — R233 Spontaneous ecchymoses: Secondary | ICD-10-CM | POA: Insufficient documentation

## 2022-07-31 MED ORDER — PREDNISONE 5 MG PO TABS: 5.0000 mg | ORAL_TABLET | Freq: Every day | ORAL | 1 refills | Status: DC

## 2022-08-01 ENCOUNTER — Ambulatory Visit (HOSPITAL_COMMUNITY): Payer: Medicare Other

## 2022-08-01 DIAGNOSIS — R262 Difficulty in walking, not elsewhere classified: Secondary | ICD-10-CM

## 2022-08-01 DIAGNOSIS — M25551 Pain in right hip: Secondary | ICD-10-CM

## 2022-08-01 NOTE — Therapy (Signed)
OUTPATIENT PHYSICAL THERAPY LOWER EXTREMITY TREATMENT NOTE   Patient Name: [name] MRN: [id number] DOB:November 07, 1965, 57 y.o., female Today's Date: 08/01/2022  END OF SESSION:  PT End of Session - 08/01/22 1429     Visit Number 18    Number of Visits 20    Date for PT Re-Evaluation 08/10/22    Authorization Type BCBS Medicare, Medicaid of [place]    Progress Note Due on Visit 20    PT Start Time 1345    PT Stop Time 1428    PT Time Calculation (min) 43 min    Equipment Utilized During Treatment Gait belt;Oxygen    Activity Tolerance Patient tolerated treatment well    Behavior During Therapy WFL for tasks assessed/performed             Past Surgical History:  Procedure Laterality Date   ABDOMINAL HYSTERECTOMY     with bladder suspension   CERVICAL FUSION     CHOLECYSTECTOMY     COLONOSCOPY N/A 07/30/2013   Procedure: COLONOSCOPY;  Surgeon: Melissa Hippo, MD;  [facility]: [facility];  Service: Endoscopy;  Laterality: N/A;  930   EXAM UNDER ANESTHESIA WITH MANIPULATION OF SHOULDER Right 10/11/2016   Procedure: EXAM UNDER ANESTHESIA WITH MANIPULATION OF SHOULDER;  Surgeon: Melissa Hearing, MD;  Location: [facility];  Service: Orthopedics;  Laterality: Right;   HERNIA REPAIR     INCISIONAL HERNIA REPAIR N/A 07/30/2016   Procedure: HERNIA REPAIR INCISIONAL WITH MESH;  Surgeon: Melissa Macho, MD;  Location: [facility];  Service: General;  Laterality: N/A;   INTERSTIM IMPLANT PLACEMENT  02/02/2021   Procedure: [name] IMPLANT FIRST STAGE LEFT SACRUM ;  Surgeon: Melissa Gauze, MD;  Location: [facility];  Service: Urology;;   [name] IMPLANT PLACEMENT N/A 02/23/2021   Procedure: [name] IMPLANT SECOND STAGE;  Surgeon: Melissa Gauze, MD;  Location: [facility];  Service: Urology;  Laterality: N/A;  Rep coming, time needs to stay at 2:00   JOINT REPLACEMENT     LOBECTOMY     right lung    POSTERIOR CERVICAL LAMINECTOMY Right 04/05/2017   Procedure: Right C6-7  Posterior cervical laminectomy;  Surgeon: Melissa Citrin, MD;  Location: [facility] OR;  Service: Neurosurgery;  Laterality: Right;  Right C6-7 Posterior cervical laminectomy   SHOULDER ARTHROSCOPY WITH ROTATOR CUFF REPAIR Right 10/11/2016   Procedure: SHOULDER ARTHROSCOPY;  Surgeon: Melissa Hearing, MD;  Location: [facility];  Service: Orthopedics;  Laterality: Right;   TOTAL HIP ARTHROPLASTY Right 04/15/2020   Procedure: RIGHT TOTAL HIP ARTHROPLASTY ANTERIOR APPROACH;  Surgeon: Melissa Hitch, MD;  Location: [facility];  Service: Orthopedics;  Laterality: Right;   TOTAL HIP ARTHROPLASTY Left 07/22/2020   Procedure: LEFT  HIP ARTHROPLASTY ANTERIOR APPROACH;  Surgeon: Melissa Hitch, MD;  Location: [facility];  Service: Orthopedics;  Laterality: Left;  RNFA   TUBAL LIGATION     Patient Active Problem List   Diagnosis Date Noted   Severe persistent asthma without complication 06/06/2022   Gait abnormality 05/28/2022   Adrenal insufficiency 03/30/2022   Endocrine disorder, unspecified 07/04/2021   Prediabetes 07/04/2021   Hypothyroidism 07/04/2021   Former smoker 06/29/2021   Seasonal and perennial allergic rhinitis 06/19/2021   Oxygen dependent 06/19/2021   Steroid dependent 06/19/2021   Immunosuppressed status 06/19/2021   Radiculopathy, lumbar region 12/08/2020   Status post left hip replacement 07/22/2020   Status post right hip replacement 05/02/2020   Status post total replacement of right hip 04/15/2020   Avascular  necrosis of bone of left hip 02/10/2020   Avascular necrosis of bone of right hip 02/10/2020   Allergic rhinitis 05/21/2019   Asthma-COPD overlap syndrome 04/30/2019   Chronic respiratory failure with hypoxia 04/30/2019   Spinal stenosis of cervical region 04/05/2017   Partial tear of right subscapularis tendon    Incisional hernia, without obstruction or gangrene    Urinary frequency 03/21/2012   Migraine equivalent syndrome 03/21/2012   Insomnia 03/21/2012     PCP: Melissa Stabile, MD  REFERRING PROVIDER: Levert Feinstein, MD  REFERRING DIAG: R26.9 (ICD-10-CM) - Gait abnormality  THERAPY DIAG:  Difficulty in walking, not elsewhere classified  Pain in right hip  Rationale for Evaluation and Treatment: Rehabilitation  ONSET DATE: April 12, 2022  SUBJECTIVE:   SUBJECTIVE STATEMENT: "Pain level is good today" pt reports being about 2/10. Bruising around arms, pt had blood work and reportedly low iron levels. Planning on doing further testing for blood levels. Is doing all Hep 2x daily and working up to 20 minutes of biking @ 5 lvl resistance. [name] reports going to ENT on 5/1.   PROGRESS NOTE 07/10/22: Patient states that she went to her neurologist and was diagnosed with vestibular neuritis. Patient says she will be referred to a neurologist. Patient states that she's still dizzy ("wavy" sensation) but not as bad as she used to be. Patient states that her balance is still off but has improved a little since she started PT. Denies any recent falls. Patient thinks she has improved around 25%. With this, patient wishes to continue with PT.   PERTINENT HISTORY: Bladder implant (will have surgery 07/26/2022), hx of B THR, lobectomy PAIN:  Are you having pain? No  PRECAUTIONS: Fall and Other: uses portable O2  WEIGHT BEARING RESTRICTIONS: No  FALLS:  Has patient fallen in last 6 months? Yes. Number of falls 7  LIVING ENVIRONMENT: Lives with: lives with their spouse Lives in: House/apartment Stairs: Yes: External: 1 steps; none Has following equipment at home: Single point cane, Walker - 2 wheeled, Environmental consultant - 4 wheeled, shower chair, bed side commode, and oxygen  OCCUPATION: on disability  PLOF: Independent with household mobility with device and Requires assistive device for independence  PATIENT GOALS: "to be able to walk straight by myself"  NEXT MD VISIT: Aug 30, 2022  OBJECTIVE:   DIAGNOSTIC FINDINGS:  MRI HEAD WITHOUT CONTRAST  06/08/22   TECHNIQUE: Multiplanar, multiecho pulse sequences of the brain and surrounding structures were obtained without intravenous contrast.   COMPARISON:  Head CT 05/22/2022. Brain MRI 05/22/2011.   FINDINGS: Brain:   No age advanced or lobar predominant parenchymal atrophy.   Multifocal T2 FLAIR hyperintense signal abnormality within the cerebral white matter, overall mild but greater than expected for age.   There is no acute infarct.   No evidence of an intracranial mass.   No chronic intracranial blood products.   No extra-axial fluid collection.   No midline shift.   Vascular: Maintained flow voids within the proximal large arterial vessels.   Skull and upper cervical spine: No focal suspicious calvarial marrow lesion. Cervical spine findings separately reported on same day cervical spine MRI.   Sinuses/Orbits: No mass or acute finding within the imaged orbits. Prior bilateral ocular lens replacement. Trace mucosal thickening within left ethmoid air cells.   Other: Small-volume fluid within the bilateral mastoid air cells.   IMPRESSION: 1.  No evidence of an acute intracranial abnormality. 2. Multifocal T2 FLAIR hyperintense signal abnormality within the cerebral  white matter, overall mild but greater than expected for age. These signal changes have slightly progressed from the prior brain MRI of 05/22/2011. Findings are nonspecific and differential considerations include chronic small vessel ischemic disease, sequelae of chronic migraine headaches, sequelae of a prior infectious/inflammatory process or sequelae of a demyelinating process (such as multiple sclerosis), among others. No lesions specifically suggest demyelinating disease. 3. Small-volume fluid within the bilateral mastoid air cells.  MRI CERVICAL SPINE WITHOUT CONTRAST 06/08/22   TECHNIQUE: Multiplanar, multisequence MR imaging of the cervical spine was performed. No intravenous contrast  was administered.   COMPARISON:  Radiographs of the cervical spine 08/06/2017. Cervical spine MRI 02/24/2017.   FINDINGS: Alignment: Straightening of the expected cervical lordosis. 3 mm C7-T1 grade 1 anterolisthesis.   Vertebrae: Vertebral body height is maintained. Susceptibility artifact arising from ACDF hardware at the C4-C7 levels. No appreciable significant marrow edema or focal suspicious osseous lesion.   Cord: No signal abnormality identified within the cervical spinal cord.   Posterior Fossa, vertebral arteries, paraspinal tissues: Posterior fossa assessed on same-day brain MRI. Flow voids preserved within the imaged cervical vertebral arteries. No paraspinal mass or collection.   Disc levels:   Unless otherwise stated, the level by level findings below have not significantly changed from the prior MRI of 03/06/2017.   Disc degeneration at the non-operative levels, greatest at C3-C4 and C7-T1 (progressed at these levels).   C2-C3: Slight disc bulge. No significant spinal canal or foraminal stenosis.   C3-C4: Disc bulge. New superimposed small central disc protrusion (at site of posterior annular fissure). Uncovertebral hypertrophy on the right, new from the prior MRI. Facet arthrosis. Mild relative spinal canal narrowing (without spinal cord mass effect), new from the prior MRI. Moderate right neural foraminal narrowing, also new from the prior MRI.   C4-C5: Prior ACDF. No significant spinal canal or foraminal stenosis.   C5-C6: Prior ACDF. Uncovertebral hypertrophy on the right. No significant spinal canal stenosis. Mild right neural foraminal narrowing.   C6-C7: Prior ACDF. Uncovertebral vertebrae on the right. No significant spinal canal stenosis. Moderate right neural foraminal narrowing.   C7-T1: 3 mm grade 1 anterolisthesis. Mild disc uncovering. Facet arthrosis. No significant spinal canal stenosis. Mild-to-moderate bilateral neural foraminal  narrowing.   IMPRESSION: Prior C4-C7 ACDF. No significant spinal canal stenosis at these levels. As before, uncovertebral hypertrophy results in neural foraminal narrowing on the right at C5-C6 (mild), and on the right at C6-C7 (moderate).   Cervical spondylosis at the remaining levels, as outlined and with findings most notably as follows.   At C3-C4, there is a disc bulge. New superimposed small central disc protrusion (at site of posterior annular fissure). Uncovertebral hypertrophy on the right, new from the prior MRI. Facet arthrosis. Mild spinal canal narrowing, and moderate right neural foraminal narrowing, new from the prior MRI.   At C7-T1, there is 3 mm grade 1 anterolisthesis. Mild disc uncovering. Facet arthrosis. No significant spinal canal stenosis. Mild-to-moderate bilateral neural foraminal narrowing. Findings at this level have not significantly changed.  COGNITION: Overall cognitive status: Within functional limits for tasks assessed     SENSATION: Not tested  MUSCLE LENGTH: (not tested on 07/10/22) Mild restriction on B hamstrings Moderate restriction on B gastrocsoleus  POSTURE: rounded shoulders and forward head   LOWER EXTREMITY MMT  MMT Right eval Left eval Right 07/10/22 Left 07/10/22  Hip flexion 4 4 4 4   Hip extension 2+ (bridging) 2+  (bridging) 3+  (bridging) 3+ (bridging)  Hip abduction 4  4 4 4   Hip adduction      Hip internal rotation      Hip external rotation      Knee flexion 4- 4- 4 4  Knee extension 4- 3+ 4 4  Ankle dorsiflexion 3+ 4- 4- 4-  Ankle plantarflexion 3+ 3+ 3+ 3+  Ankle inversion      Ankle eversion       (Blank rows = not tested)  LOWER EXTREMITY ROM  Active ROM Right eval Left eval  Hip flexion [facility]  Hip extension    Hip abduction [facility]  Hip adduction [facility]  Hip internal rotation    Hip external rotation    Knee flexion [facility] WFL  Knee extension [facility]  Ankle dorsiflexion [facility] WFL  Ankle  plantarflexion [facility] WFL  Ankle inversion    Ankle eversion     (Blank rows = not tested)  FUNCTIONAL TESTS:  5 times sit to stand: 18.88 sec (little assist from rollator) from 26.97 sec (has to hold on to rollator) 2 minute walk test: 304 ft (with rollator) from 224 ft (with a rollator) Tinetti POMA : 14 from 9 (balance + gait)  GAIT: Distance walked: 304 ft Assistive device utilized:  rollator Level of assistance: Modified independence Comments: foot clearance on B more evident, normal step length on B  VESTIBULAR ASSESSMENT  06/21/22 07/10/22  Head impulse test positive with corrective saccades, dizziness reported positive with corrective saccades, dizziness reported  VOR 1 corrective saccades, dizziness reported corrective saccades, dizziness reported  Optokinetic reflex (R/L)  impaired, dizziness reported  impaired, dizziness reported    TODAY'S TREATMENT:                                                                                                                              DATE:  08/01/2022  -Tandem stance 2 x 1 min bilaterally; intermittently UE touching -Modified single leg balnce standing with blue balance beam with UE 1lb passing 3 x 12 repetitions. Use of bodyblade for coordination skill practice. -8in stepups 6in box and 2in blue foam pad 2 x 8-10. Cues for intermittent HHA on parallel bars.  -270ft SBQC ambulation; CGA--->supervision level   07/30/22 Sit to stand x 10 no UE assist Sit to stand holding green med ball 1 x 10  Sit to stand holding red med ball x 10 Standing: 8" step ups x 10 each with intermittent 2 hand assist Tandem stance on foam 2 x 10" each Heel/toe raises on incline with no UE assist x 10 Small base of support x 30" no UE assist  Ambulation with SBQC x 230 ft with CGA   07/27/22 Sit to stand 2 x 10 no UE assist Sitting: Diaphragmatic breathing review education and performance  Standing: Heel taps x 10 each no UE assist 4" step ups x 5  each no UE assist  Gait training with large base quad cane with CGA x 225 ft  07/25/22 Gait with large base quad cane 300 feet with CGA STS 1 x 10 Anterior stepping with bilateral overhead reach 1 x 15 bilateral   07/23/22 Diaphragmatic breathing - education and performance - 5 minutes Step up 6 inch 2 x 10 bilateral unilateral HHA LAQ 10 x 5 second holds Standing hamstring curls 2# 2 x 10 Lateral stepping 3 x 8 feet bilateral with CGA Gait with large base quad cane 40 feet with CGA   07/20/2022 Cervical neck AROM flex/ext, rot x 10 Gastrocnemius slant board stretch x 30" x 3 Sit-to-stand, (emphasis on scooting, heel placement back, and trunk momentum), CGA x 10 Standing, firm surface, normal BOS:  Horizontal saccades, while reading words x 1'  Vertical saccades, while reading numbers x 1'  Horizontal smooth pursuit, identifying animal silhouettes x 1'  Vertical smooth pursuit, identifying/counting colors/shapes x 1'  Heel/toe raises, x 2 lbs no UE support x 10 x 2  Tandem stance x 30" x 2, eyes open, light UE support  Rhytmic stabilization x ant/post x 3" x 10 x 2, no UE support  Rocking front/back, L arm swing, R LE forward x 30", R UE support  Mini squats 3" x 10 x 2, no UE support  TKE, GTB x 10 x 2 x 3"  Hip vectors x 10 x 2, GTB  07/19/2022 Cervical neck AROM flex/ext, rot x 10 Gastrocnemius slant board stretch x 30" x 3 Sit-to-stand, (emphasis on scooting, heel placement back, and trunk momentum), CGA x 10 Standing, firm surface, normal BOS:  Horizontal saccades, while reading words x 1'  Vertical saccades, while reading numbers x 1'  Horizontal smooth pursuit, identifying animal silhouettes x 1'  Vertical smooth pursuit, identifying/counting colors/shapes x 1'  VOR 1, own pace, identifying colors x 1'  VOR cancellation, reading numbers x 30"  Heel/toe raises, no UE support x 10 x 2  Tandem stance x 30" x 2, eyes open, light UE support  Rhytmic stabilization x  ant/post x 3" x 10 x 2, no UE support  Rocking front/back, L arm swing, R LE forward x 30", R UE support  Mini squats 3" x 10 x 2, no UE support  TKE, GTB x 10 x 2 x 3"  Hip vectors x 10 x 2, GTB  07/17/22 Standing HR/TR 2 x 10 Step up 4 inch 1 x 10 bilateral UE support, 1 x 10 unilateral UE support bilateral  Rhytmic stabilization x ant/post and lateral x 3" x 10 x 1, no UE support Lateral stepping 4 x 8 feet bilateral CGA Hip vectors GTB at calf 2 x 5 bilateral  Sit-to-stand, seat slightly elevated (emphasis on scooting, heel placement back, and trunk momentum), CGA x 10; 5 x with emphasis on corkscrew of feet  07/13/2022 Cervical neck AROM flex/ext, rot x 10 Gastrocnemius slant board stretch x 30" x 3 Sit-to-stand, seat slightly elevated (emphasis on scooting, heel placement back, and trunk momentum), CGA x 10 Standing, firm surface, normal BOS:  Horizontal saccades, while reading words x 1'  Vertical saccades, while reading numbers x 1'  Horizontal smooth pursuit, identifying animal silhouettes x 1'  Vertical smooth pursuit, identifying/counting colors/shapes x 1'  VOR 1, own pace, identifying colors x 1'  VOR cancellation, identifying shapes x 30"  Heel/toe raises, no UE support x 10 x 2  Tandem stance x 30" x 2, eyes open, light UE support Static standing, eyes open x 30" x 2, no UE support  Rhytmic stabilization x ant/post x  3" x 10 x 2, no UE support  Rocking front/back, L arm swing, R LE forward x 30", R UE support  Mini squats 3" x 10 x 2, no UE support  TKE, GTB x 10 x 2 x 3"  Hip vectors x 10 x 2, RTB  07/12/2022 Cervical neck AROM flex/ext, rot x 10 Gastrocnemius slant board stretch x 30" x 3 Sit-to-stand, seat slightly elevated (emphasis on scooting, heel placement back, and trunk momentum), CGA Standing, firm surface, normal BOS:  Horizontal saccades, while reading words x 1'  Vertical saccades, while reading words x 1'  Horizontal smooth pursuit, identifying  animal silhouettes x 1'  Vertical smooth pursuit, identifying colors x 1'  VOR 1, own pace, identifying numbers x 1'  VOR cancellation, identifying shapes x 30"  Heel/toe raises with 1 UE support x 10 x 2  Tandem stance x 30" x 2, eyes open, light UE support Static standing, eyes open x 30" x 2 Static standing with head turns x 30" x 2  Rhytmic stabilization x ant/post x 3" x 10 x 2  Mini squats 3" x 10 x 2, no UE support  TKE, RTB x 10 x 2 x 3"  Hip ext x 10 x 2, RTB  07/10/2022 Progress note ( , Tinetti, vestibular exam, ROM, MMT, 5TSTS) Cervical neck AROM flex/ext, rot x 10 Standing, firm surface, normal BOS:  Horizontal saccades, while reading words x 1'  Vertical saccades, while identifying numbers x 1'  Horizontal smooth pursuit, identifying color/shapes x 1'  Vertical smooth pursuit, identifying animal silhouettes x 1'  VOR 1, own pace, identifying numbers x 1'      PATIENT EDUCATION:  Education details: Updated HEP. Instructed patient to perform standing exercises on with a stable support (e.g. kitchen counter) and have her husband next to her. Educated on possible anxiety related/multiple systems contributing to symptoms Person educated: Patient Education method: Explanation Education comprehension: verbalized understanding  HOME EXERCISE PROGRAM: Access Code: [email or web address] URL: [email or web address]  07/25/22 - Forward Step with Same Side Arm Raise  - 1 x daily - 7 x weekly - 2 sets - 10 reps  07/23/22- Supine Diaphragmatic Breathing  - 1 x daily - 7 x weekly - 10 reps - Seated Diaphragmatic Breathing  - 1 x daily - 7 x weekly - 10 reps - Step Up  - 1-2 x daily - 7 x weekly - 2 sets - 10 reps  07/17/22 - Sit to Stand with Arms Crossed  - 1 x daily - 7 x weekly - 1-2 sets - 10 reps  07/13/22: - Standing 3-Way Leg Reach with Resistance at Ankles and Counter Support  - 1 x daily - 7 x weekly - 2 sets - 10 reps  07/02/22 hip abduction, small bos standing and  regular standing head turns and nods  06/28/2022 - Mini Squat with Counter Support  - 1-2 x daily - 5-7 x weekly - 2 sets - 10 reps - 3 hold - Standing Hip Extension with Counter Support  - 1-2 x daily - 5-7 x weekly - 2 sets - 10 reps  Date: 06/27/2022 Prepared by: Krystal Clark  Exercises - Seated Calf Stretch with Strap  - 1-2 x daily - 5-7 x weekly - 3 reps - 30 hold - Standing Tandem Balance with Counter Support  - 1-2 x daily - 5-7 x weekly - 30 hold - Heel Toe Raises with Counter Support  - 1-2 x daily - 5-7 x weekly - 2  sets - 10 reps  06/21/2022 GAZE STABILIZATION EXERCISES (2-3x/day, 5-7 days/week, seated) These exercises are designed to improve your eyes' ability to track objects without getting dizzy. When performing these exercises, make sure you are facing a blank wall (like a white wall) to avoid distraction. You can wear your glasses/contact lenses if you must.   Before anything, warm-up your neck muscles by bending it forward and back 10 times, side-to-side 10 times, and slowly turn your head left and right 10 time Exercise 1 (Horizontal Saccades) Use two cards for this exercise While holding the cards on each hand side by side in sitting with arms outstretched, look at the first item on the other card then look at the next item on the second card then alternate your gaze sequentially. Do NOT move your cards nor the your head. Do the following for one minute each Identifying numbers Identifying shapes Exercise 2 (Paradigm x1) Use only one card for this exercise While holding the card in sitting with your arm outstretched, turn your head left and right at your own pace while keeping the card stationary.  Do this for one minute each while you are: Identifying numbers Identifying shapes Exercise 3 (VOR cancellation) Use only one card for this exercise While holding the card in sitting with your arms outstretched, turn your body, arm, and head at the same time left and  right at your own pace Do this for one minute each while you are: Identifying numbers Identifying shapes  3/11/20204 - VOR x 1 Habituation Exercises 3x daily - Sit/stands w/ 4.5lb weight at home 2 x 5 daily.  ASSESSMENT:  CLINICAL IMPRESSION: Pt tolerating today's well with focus on somatosensory changes to functional tasks to improve static balance and postural adjustments during functional tasks. Pt intermittentlly using HHA on rails with small LOB laterally. Fatiguing appropriately for tasks given during session. Continues to show UE support when performing functional upright tasks but adjusting to challenges well.  Patient will continue to benefit from physical therapy in order to improve function and reduce impairment.   PROGRESS NOTE 07/10/22: Patient demonstrated continued improvements in function as indicated by positive significant changes in , 5TSTS, and strength. However, patient still presents with deficits in vestibular function which greatly affects her standing balance. Patient still presents as a falls risk based on the Tinetti score. With this, skilled PT is still required to address the impairments and functional limitations listed below.  Interventions today were geared towards gaze stabilization incorporated with her standing balance. Patient tolerated gaze stabilization activities with mild dizziness and unsteadiness on the saccades, VOR 1 and cancellation exercises. Demonstrated appropriate levels of fatigue. Rest periods provided. Provided slight amount of cueing to ensure correct execution of activity with good carry-over.    OBJECTIVE IMPAIRMENTS: Abnormal gait, decreased balance, decreased coordination, decreased endurance, difficulty walking, decreased strength, impaired flexibility, and pain.   ACTIVITY LIMITATIONS: carrying, lifting, bending, standing, squatting, stairs, transfers, continence, bathing, toileting, dressing, self feeding, hygiene/grooming, and  locomotion level  PARTICIPATION LIMITATIONS: meal prep, cleaning, laundry, driving, shopping, community activity, and yard work  PERSONAL FACTORS: Fitness level, Bladder implant (will have surgery 07/26/2022), hx of B THR, lobectomy are also affecting patient's functional outcome.   REHAB POTENTIAL: Fair    CLINICAL DECISION MAKING: Evolving/moderate complexity  EVALUATION COMPLEXITY: Moderate   GOALS: Goals reviewed with patient? Yes  SHORT TERM GOALS: Target date: 07/13/2022  Pt will demonstrate indep in HEP to facilitate carry-over of skilled services and improve functional outcomes Goal  status: MET  LONG TERM GOALS: Target date: 08/10/2022  Pt will be report little to no dizziness on VOR 1 and head impulse test to facilitate safety on ambulation Baseline: with dizziness Goal status: INITIAL/NEW   Pt will decrease 5TSTS by at least 5 seconds in order to demonstrate clinically significant improvement in LE strength  Baseline: 18.88 sec Goal status: REVISED  3.  Patient will demonstrate increase in Tinetti Score by 8 points in order to demonstrate clinically significant improvement in balance and decreased risk for falls  Baseline: 9 Goal status: IN PROGRESS  4.  Pt will increase by at least 40 ft in order to demonstrate clinically significant improvement in community ambulation Baseline: 304 ft Goal status: REVISED  5.  Pt will demonstrate increase in LE strength to 4 to facilitate ease and safety in ambulation Baseline: 2+ Goal status: IN PROGRESS  6.  Pt will demonstrate improved LE coordination as manifested by performing 3/4 correct trials in heel-to-shin to facilitate ease in ambulation Baseline: impaired Goal status: INITIAL  7.  Pt will be able to walk with a quad cane for > 50 ft Baseline: walks with a rollator Goal status: REVISED   PLAN:  PT FREQUENCY: 3x/week  PT DURATION: 4 weeks  PLANNED INTERVENTIONS: Therapeutic exercises, Therapeutic  activity, Neuromuscular re-education, Balance training, Gait training, Patient/Family education, Self Care, and Stair training  PLAN FOR NEXT SESSION: Continue POC and may progress as tolerated with emphasis on balance, LE strengthening, flexibility, and vestibular function.   2:30 PM, 08/01/22 Amy Small Lynch MPT [facility] [place] [phone]

## 2022-08-03 ENCOUNTER — Ambulatory Visit (HOSPITAL_COMMUNITY): Payer: Medicare Other

## 2022-08-03 DIAGNOSIS — R262 Difficulty in walking, not elsewhere classified: Secondary | ICD-10-CM

## 2022-08-03 DIAGNOSIS — M25551 Pain in right hip: Secondary | ICD-10-CM

## 2022-08-03 NOTE — Therapy (Signed)
OUTPATIENT PHYSICAL THERAPY LOWER EXTREMITY TREATMENT NOTE   Patient Name: [name] MRN: [id number] DOB:07-21-1965, 57 y.o., female Today's Date: 08/03/2022  END OF SESSION:  PT End of Session - 08/03/22 0944     Visit Number 19    Number of Visits 20    Date for PT Re-Evaluation 08/10/22    Authorization Type BCBS Medicare, Medicaid of [place]    Progress Note Due on Visit 20    PT Start Time [phone]    PT Stop Time 1025    PT Time Calculation (min) 41 min    Equipment Utilized During Treatment Gait belt;Oxygen    Activity Tolerance Patient tolerated treatment well    Behavior During Therapy WFL for tasks assessed/performed             Past Surgical History:  Procedure Laterality Date   ABDOMINAL HYSTERECTOMY     with bladder suspension   CERVICAL FUSION     CHOLECYSTECTOMY     COLONOSCOPY N/A 07/30/2013   Procedure: COLONOSCOPY;  Surgeon: Malissa Hippo, MD;  Location: [facility];  Service: Endoscopy;  Laterality: N/A;  930   EXAM UNDER ANESTHESIA WITH MANIPULATION OF SHOULDER Right 10/11/2016   Procedure: EXAM UNDER ANESTHESIA WITH MANIPULATION OF SHOULDER;  Surgeon: Vickki Hearing, MD;  Location: [facility];  Service: Orthopedics;  Laterality: Right;   HERNIA REPAIR     INCISIONAL HERNIA REPAIR N/A 07/30/2016   Procedure: HERNIA REPAIR INCISIONAL WITH MESH;  Surgeon: [name], MD;  Location: [facility];  Service: General;  Laterality: N/A;   INTERSTIM IMPLANT PLACEMENT  02/02/2021   Procedure: Leane Platt IMPLANT FIRST STAGE LEFT SACRUM ;  Surgeon: Malen Gauze, MD;  Location: [facility];  Service: Urology;;   Leane Platt IMPLANT PLACEMENT N/A 02/23/2021   Procedure: Leane Platt IMPLANT SECOND STAGE;  Surgeon: Malen Gauze, MD;  Location: [facility];  Service: Urology;  Laterality: N/A;  Rep coming, time needs to stay at 2:00   JOINT REPLACEMENT     LOBECTOMY     right lung    POSTERIOR CERVICAL LAMINECTOMY Right 04/05/2017   Procedure: Right C6-7  Posterior cervical laminectomy;  Surgeon: Donalee Citrin, MD;  Location: [facility] OR;  Service: Neurosurgery;  Laterality: Right;  Right C6-7 Posterior cervical laminectomy   SHOULDER ARTHROSCOPY WITH ROTATOR CUFF REPAIR Right 10/11/2016   Procedure: SHOULDER ARTHROSCOPY;  Surgeon: Vickki Hearing, MD;  Location: [facility];  Service: Orthopedics;  Laterality: Right;   TOTAL HIP ARTHROPLASTY Right 04/15/2020   Procedure: RIGHT TOTAL HIP ARTHROPLASTY ANTERIOR APPROACH;  Surgeon: Kathryne Hitch, MD;  Location: [facility];  Service: Orthopedics;  Laterality: Right;   TOTAL HIP ARTHROPLASTY Left 07/22/2020   Procedure: LEFT  HIP ARTHROPLASTY ANTERIOR APPROACH;  Surgeon: Kathryne Hitch, MD;  Location: [facility];  Service: Orthopedics;  Laterality: Left;  RNFA   TUBAL LIGATION     Patient Active Problem List   Diagnosis Date Noted   Severe persistent asthma without complication 06/06/2022   Gait abnormality 05/28/2022   Adrenal insufficiency (HCC) 03/30/2022   Endocrine disorder, unspecified 07/04/2021   Prediabetes 07/04/2021   Hypothyroidism 07/04/2021   Former smoker 06/29/2021   Seasonal and perennial allergic rhinitis 06/19/2021   Oxygen dependent 06/19/2021   Steroid dependent (HCC) 06/19/2021   Immunosuppressed status (HCC) 06/19/2021   Radiculopathy, lumbar region 12/08/2020   Status post left hip replacement 07/22/2020   Status post right hip replacement 05/02/2020   Status post total replacement of right hip 04/15/2020  Avascular necrosis of bone of left hip (HCC) 02/10/2020   Avascular necrosis of bone of right hip (HCC) 02/10/2020   Allergic rhinitis 05/21/2019   Asthma-COPD overlap syndrome 04/30/2019   Chronic respiratory failure with hypoxia (HCC) 04/30/2019   Spinal stenosis of cervical region 04/05/2017   Partial tear of right subscapularis tendon    Incisional hernia, without obstruction or gangrene    Urinary frequency 03/21/2012   Migraine equivalent syndrome  03/21/2012   Insomnia 03/21/2012    PCP: Benita Stabile, MD  REFERRING PROVIDER: Levert Feinstein, MD  REFERRING DIAG: R26.9 (ICD-10-CM) - Gait abnormality  THERAPY DIAG:  Difficulty in walking, not elsewhere classified  Pain in right hip  Rationale for Evaluation and Treatment: Rehabilitation  ONSET DATE: April 12, 2022  SUBJECTIVE:   SUBJECTIVE STATEMENT: Still seeing ENT 5/1; having some abnormalities with her bloodwork; thyroid issues and anemia.  Feeling ok but tired.    PROGRESS NOTE 07/10/22: Patient states that she went to her neurologist and was diagnosed with vestibular neuritis. Patient says she will be referred to a neurologist. Patient states that she's still dizzy ("wavy" sensation) but not as bad as she used to be. Patient states that her balance is still off but has improved a little since she started PT. Denies any recent falls. Patient thinks she has improved around 25%. With this, patient wishes to continue with PT.   PERTINENT HISTORY: Bladder implant (will have surgery 07/26/2022), hx of B THR, lobectomy PAIN:  Are you having pain? No  PRECAUTIONS: Fall and Other: uses portable O2  WEIGHT BEARING RESTRICTIONS: No  FALLS:  Has patient fallen in last 6 months? Yes. Number of falls 7  LIVING ENVIRONMENT: Lives with: lives with their spouse Lives in: House/apartment Stairs: Yes: External: 1 steps; none Has following equipment at home: Single point cane, Walker - 2 wheeled, Environmental consultant - 4 wheeled, shower chair, bed side commode, and oxygen  OCCUPATION: on disability  PLOF: Independent with household mobility with device and Requires assistive device for independence  PATIENT GOALS: "to be able to walk straight by myself"  NEXT MD VISIT: Aug 30, 2022  OBJECTIVE:   DIAGNOSTIC FINDINGS:  MRI HEAD WITHOUT CONTRAST 06/08/22   TECHNIQUE: Multiplanar, multiecho pulse sequences of the brain and surrounding structures were obtained without intravenous contrast.    COMPARISON:  Head CT 05/22/2022. Brain MRI 05/22/2011.   FINDINGS: Brain:   No age advanced or lobar predominant parenchymal atrophy.   Multifocal T2 FLAIR hyperintense signal abnormality within the cerebral white matter, overall mild but greater than expected for age.   There is no acute infarct.   No evidence of an intracranial mass.   No chronic intracranial blood products.   No extra-axial fluid collection.   No midline shift.   Vascular: Maintained flow voids within the proximal large arterial vessels.   Skull and upper cervical spine: No focal suspicious calvarial marrow lesion. Cervical spine findings separately reported on same day cervical spine MRI.   Sinuses/Orbits: No mass or acute finding within the imaged orbits. Prior bilateral ocular lens replacement. Trace mucosal thickening within left ethmoid air cells.   Other: Small-volume fluid within the bilateral mastoid air cells.   IMPRESSION: 1.  No evidence of an acute intracranial abnormality. 2. Multifocal T2 FLAIR hyperintense signal abnormality within the cerebral white matter, overall mild but greater than expected for age. These signal changes have slightly progressed from the prior brain MRI of 05/22/2011. Findings are nonspecific and differential considerations include chronic  small vessel ischemic disease, sequelae of chronic migraine headaches, sequelae of a prior infectious/inflammatory process or sequelae of a demyelinating process (such as multiple sclerosis), among others. No lesions specifically suggest demyelinating disease. 3. Small-volume fluid within the bilateral mastoid air cells.  MRI CERVICAL SPINE WITHOUT CONTRAST 06/08/22   TECHNIQUE: Multiplanar, multisequence MR imaging of the cervical spine was performed. No intravenous contrast was administered.   COMPARISON:  Radiographs of the cervical spine 08/06/2017. Cervical spine MRI 02/24/2017.   FINDINGS: Alignment:  Straightening of the expected cervical lordosis. 3 mm C7-T1 grade 1 anterolisthesis.   Vertebrae: Vertebral body height is maintained. Susceptibility artifact arising from ACDF hardware at the C4-C7 levels. No appreciable significant marrow edema or focal suspicious osseous lesion.   Cord: No signal abnormality identified within the cervical spinal cord.   Posterior Fossa, vertebral arteries, paraspinal tissues: Posterior fossa assessed on same-day brain MRI. Flow voids preserved within the imaged cervical vertebral arteries. No paraspinal mass or collection.   Disc levels:   Unless otherwise stated, the level by level findings below have not significantly changed from the prior MRI of 03/06/2017.   Disc degeneration at the non-operative levels, greatest at C3-C4 and C7-T1 (progressed at these levels).   C2-C3: Slight disc bulge. No significant spinal canal or foraminal stenosis.   C3-C4: Disc bulge. New superimposed small central disc protrusion (at site of posterior annular fissure). Uncovertebral hypertrophy on the right, new from the prior MRI. Facet arthrosis. Mild relative spinal canal narrowing (without spinal cord mass effect), new from the prior MRI. Moderate right neural foraminal narrowing, also new from the prior MRI.   C4-C5: Prior ACDF. No significant spinal canal or foraminal stenosis.   C5-C6: Prior ACDF. Uncovertebral hypertrophy on the right. No significant spinal canal stenosis. Mild right neural foraminal narrowing.   C6-C7: Prior ACDF. Uncovertebral vertebrae on the right. No significant spinal canal stenosis. Moderate right neural foraminal narrowing.   C7-T1: 3 mm grade 1 anterolisthesis. Mild disc uncovering. Facet arthrosis. No significant spinal canal stenosis. Mild-to-moderate bilateral neural foraminal narrowing.   IMPRESSION: Prior C4-C7 ACDF. No significant spinal canal stenosis at these levels. As before, uncovertebral hypertrophy  results in neural foraminal narrowing on the right at C5-C6 (mild), and on the right at C6-C7 (moderate).   Cervical spondylosis at the remaining levels, as outlined and with findings most notably as follows.   At C3-C4, there is a disc bulge. New superimposed small central disc protrusion (at site of posterior annular fissure). Uncovertebral hypertrophy on the right, new from the prior MRI. Facet arthrosis. Mild spinal canal narrowing, and moderate right neural foraminal narrowing, new from the prior MRI.   At C7-T1, there is 3 mm grade 1 anterolisthesis. Mild disc uncovering. Facet arthrosis. No significant spinal canal stenosis. Mild-to-moderate bilateral neural foraminal narrowing. Findings at this level have not significantly changed.  COGNITION: Overall cognitive status: Within functional limits for tasks assessed     SENSATION: Not tested  MUSCLE LENGTH: (not tested on 07/10/22) Mild restriction on B hamstrings Moderate restriction on B gastrocsoleus  POSTURE: rounded shoulders and forward head   LOWER EXTREMITY MMT  MMT Right eval Left eval Right 07/10/22 Left 07/10/22  Hip flexion 4 4 4 4   Hip extension 2+ (bridging) 2+  (bridging) 3+  (bridging) 3+ (bridging)  Hip abduction 4 4 4 4   Hip adduction      Hip internal rotation      Hip external rotation      Knee flexion 4- 4-  4 4  Knee extension 4- 3+ 4 4  Ankle dorsiflexion 3+ 4- 4- 4-  Ankle plantarflexion 3+ 3+ 3+ 3+  Ankle inversion      Ankle eversion       (Blank rows = not tested)  LOWER EXTREMITY ROM  Active ROM Right eval Left eval  Hip flexion [facility]  Hip extension    Hip abduction [facility]  Hip adduction [facility]  Hip internal rotation    Hip external rotation    Knee flexion [facility] WFL  Knee extension [facility]  Ankle dorsiflexion [facility] WFL  Ankle plantarflexion [facility] WFL  Ankle inversion    Ankle eversion     (Blank rows = not tested)  FUNCTIONAL TESTS:  5 times sit to stand: 18.88 sec  (little assist from rollator) from 26.97 sec (has to hold on to rollator) 2 minute walk test: 304 ft (with rollator) from 224 ft (with a rollator) Tinetti POMA : 14 from 9 (balance + gait)  GAIT: Distance walked: 304 ft Assistive device utilized:  rollator Level of assistance: Modified independence Comments: foot clearance on B more evident, normal step length on B  VESTIBULAR ASSESSMENT  06/21/22 07/10/22  Head impulse test positive with corrective saccades, dizziness reported positive with corrective saccades, dizziness reported  VOR 1 corrective saccades, dizziness reported corrective saccades, dizziness reported  Optokinetic reflex (R/L)  impaired, dizziness reported  impaired, dizziness reported    TODAY'S TREATMENT:                                                                                                                              DATE:  08/03/2022 Sit to stand with red ball 2 x 10 Foam balance with UE movement with dowel bilateral shoulder flexion and then trunk rotation x 8 each Foam marching 2 x 10 Tandem stance 2 x 30" each Gait training with SBQC x 225 ft; step over low hurdles and around cones   08/01/2022  -Tandem stance 2 x 1 min bilaterally; intermittently UE touching -Modified single leg balnce standing with blue balance beam with UE 1lb passing 3 x 12 repetitions. Use of bodyblade for coordination skill practice. -8in stepups 6in box and 2in blue foam pad 2 x 8-10. Cues for intermittent HHA on parallel bars.  -294ft SBQC ambulation; CGA--->supervision level   07/30/22 Sit to stand x 10 no UE assist Sit to stand holding green med ball 1 x 10  Sit to stand holding red med ball x 10 Standing: 8" step ups x 10 each with intermittent 2 hand assist Tandem stance on foam 2 x 10" each Heel/toe raises on incline with no UE assist x 10 Small base of support x 30" no UE assist  Ambulation with SBQC x 230 ft with CGA   07/27/22 Sit to stand 2 x 10 no UE  assist Sitting: Diaphragmatic breathing review education and performance  Standing: Heel taps x 10 each no UE  assist 4" step ups x 5 each no UE assist  Gait training with large base quad cane with CGA x 225 ft   07/25/22 Gait with large base quad cane 300 feet with CGA STS 1 x 10 Anterior stepping with bilateral overhead reach 1 x 15 bilateral   07/23/22 Diaphragmatic breathing - education and performance - 5 minutes Step up 6 inch 2 x 10 bilateral unilateral HHA LAQ 10 x 5 second holds Standing hamstring curls 2# 2 x 10 Lateral stepping 3 x 8 feet bilateral with CGA Gait with large base quad cane 40 feet with CGA   07/20/2022 Cervical neck AROM flex/ext, rot x 10 Gastrocnemius slant board stretch x 30" x 3 Sit-to-stand, (emphasis on scooting, heel placement back, and trunk momentum), CGA x 10 Standing, firm surface, normal BOS:  Horizontal saccades, while reading words x 1'  Vertical saccades, while reading numbers x 1'  Horizontal smooth pursuit, identifying animal silhouettes x 1'  Vertical smooth pursuit, identifying/counting colors/shapes x 1'  Heel/toe raises, x 2 lbs no UE support x 10 x 2  Tandem stance x 30" x 2, eyes open, light UE support  Rhytmic stabilization x ant/post x 3" x 10 x 2, no UE support  Rocking front/back, L arm swing, R LE forward x 30", R UE support  Mini squats 3" x 10 x 2, no UE support  TKE, GTB x 10 x 2 x 3"  Hip vectors x 10 x 2, GTB  07/19/2022 Cervical neck AROM flex/ext, rot x 10 Gastrocnemius slant board stretch x 30" x 3 Sit-to-stand, (emphasis on scooting, heel placement back, and trunk momentum), CGA x 10 Standing, firm surface, normal BOS:  Horizontal saccades, while reading words x 1'  Vertical saccades, while reading numbers x 1'  Horizontal smooth pursuit, identifying animal silhouettes x 1'  Vertical smooth pursuit, identifying/counting colors/shapes x 1'  VOR 1, own pace, identifying colors x 1'  VOR cancellation, reading  numbers x 30"  Heel/toe raises, no UE support x 10 x 2  Tandem stance x 30" x 2, eyes open, light UE support  Rhytmic stabilization x ant/post x 3" x 10 x 2, no UE support  Rocking front/back, L arm swing, R LE forward x 30", R UE support  Mini squats 3" x 10 x 2, no UE support  TKE, GTB x 10 x 2 x 3"  Hip vectors x 10 x 2, GTB  07/17/22 Standing HR/TR 2 x 10 Step up 4 inch 1 x 10 bilateral UE support, 1 x 10 unilateral UE support bilateral  Rhytmic stabilization x ant/post and lateral x 3" x 10 x 1, no UE support Lateral stepping 4 x 8 feet bilateral CGA Hip vectors GTB at calf 2 x 5 bilateral  Sit-to-stand, seat slightly elevated (emphasis on scooting, heel placement back, and trunk momentum), CGA x 10; 5 x with emphasis on corkscrew of feet  07/13/2022 Cervical neck AROM flex/ext, rot x 10 Gastrocnemius slant board stretch x 30" x 3 Sit-to-stand, seat slightly elevated (emphasis on scooting, heel placement back, and trunk momentum), CGA x 10 Standing, firm surface, normal BOS:  Horizontal saccades, while reading words x 1'  Vertical saccades, while reading numbers x 1'  Horizontal smooth pursuit, identifying animal silhouettes x 1'  Vertical smooth pursuit, identifying/counting colors/shapes x 1'  VOR 1, own pace, identifying colors x 1'  VOR cancellation, identifying shapes x 30"  Heel/toe raises, no UE support x 10 x 2  Tandem stance x  30" x 2, eyes open, light UE support Static standing, eyes open x 30" x 2, no UE support  Rhytmic stabilization x ant/post x 3" x 10 x 2, no UE support  Rocking front/back, L arm swing, R LE forward x 30", R UE support  Mini squats 3" x 10 x 2, no UE support  TKE, GTB x 10 x 2 x 3"  Hip vectors x 10 x 2, RTB  07/12/2022 Cervical neck AROM flex/ext, rot x 10 Gastrocnemius slant board stretch x 30" x 3 Sit-to-stand, seat slightly elevated (emphasis on scooting, heel placement back, and trunk momentum), CGA Standing, firm surface, normal  BOS:  Horizontal saccades, while reading words x 1'  Vertical saccades, while reading words x 1'  Horizontal smooth pursuit, identifying animal silhouettes x 1'  Vertical smooth pursuit, identifying colors x 1'  VOR 1, own pace, identifying numbers x 1'  VOR cancellation, identifying shapes x 30"  Heel/toe raises with 1 UE support x 10 x 2  Tandem stance x 30" x 2, eyes open, light UE support Static standing, eyes open x 30" x 2 Static standing with head turns x 30" x 2  Rhytmic stabilization x ant/post x 3" x 10 x 2  Mini squats 3" x 10 x 2, no UE support  TKE, RTB x 10 x 2 x 3"  Hip ext x 10 x 2, RTB  07/10/2022 Progress note ( , Tinetti, vestibular exam, ROM, MMT, 5TSTS) Cervical neck AROM flex/ext, rot x 10 Standing, firm surface, normal BOS:  Horizontal saccades, while reading words x 1'  Vertical saccades, while identifying numbers x 1'  Horizontal smooth pursuit, identifying color/shapes x 1'  Vertical smooth pursuit, identifying animal silhouettes x 1'  VOR 1, own pace, identifying numbers x 1'      PATIENT EDUCATION:  Education details: Updated HEP. Instructed patient to perform standing exercises on with a stable support (e.g. kitchen counter) and have her husband next to her. Educated on possible anxiety related/multiple systems contributing to symptoms Person educated: Patient Education method: Explanation Education comprehension: verbalized understanding  HOME EXERCISE PROGRAM: Access Code: [email or web address] URL: [email or web address]  07/25/22 - Forward Step with Same Side Arm Raise  - 1 x daily - 7 x weekly - 2 sets - 10 reps  07/23/22- Supine Diaphragmatic Breathing  - 1 x daily - 7 x weekly - 10 reps - Seated Diaphragmatic Breathing  - 1 x daily - 7 x weekly - 10 reps - Step Up  - 1-2 x daily - 7 x weekly - 2 sets - 10 reps  07/17/22 - Sit to Stand with Arms Crossed  - 1 x daily - 7 x weekly - 1-2 sets - 10 reps  07/13/22: - Standing 3-Way Leg Reach  with Resistance at Ankles and Counter Support  - 1 x daily - 7 x weekly - 2 sets - 10 reps  07/02/22 hip abduction, small bos standing and regular standing head turns and nods  06/28/2022 - Mini Squat with Counter Support  - 1-2 x daily - 5-7 x weekly - 2 sets - 10 reps - 3 hold - Standing Hip Extension with Counter Support  - 1-2 x daily - 5-7 x weekly - 2 sets - 10 reps  Date: 06/27/2022 Prepared by: Krystal Clark  Exercises - Seated Calf Stretch with Strap  - 1-2 x daily - 5-7 x weekly - 3 reps - 30 hold - Standing Tandem Balance with Counter Support  - 1-2 x daily -  5-7 x weekly - 30 hold - Heel Toe Raises with Counter Support  - 1-2 x daily - 5-7 x weekly - 2 sets - 10 reps  06/21/2022 GAZE STABILIZATION EXERCISES (2-3x/day, 5-7 days/week, seated) These exercises are designed to improve your eyes' ability to track objects without getting dizzy. When performing these exercises, make sure you are facing a blank wall (like a white wall) to avoid distraction. You can wear your glasses/contact lenses if you must.   Before anything, warm-up your neck muscles by bending it forward and back 10 times, side-to-side 10 times, and slowly turn your head left and right 10 time Exercise 1 (Horizontal Saccades) Use two cards for this exercise While holding the cards on each hand side by side in sitting with arms outstretched, look at the first item on the other card then look at the next item on the second card then alternate your gaze sequentially. Do NOT move your cards nor the your head. Do the following for one minute each Identifying numbers Identifying shapes Exercise 2 (Paradigm x1) Use only one card for this exercise While holding the card in sitting with your arm outstretched, turn your head left and right at your own pace while keeping the card stationary.  Do this for one minute each while you are: Identifying numbers Identifying shapes Exercise 3 (VOR cancellation) Use only one  card for this exercise While holding the card in sitting with your arms outstretched, turn your body, arm, and head at the same time left and right at your own pace Do this for one minute each while you are: Identifying numbers Identifying shapes  3/11/20204 - VOR x 1 Habituation Exercises 3x daily - Sit/stands w/ 4.5lb weight at home 2 x 5 daily.  ASSESSMENT:  CLINICAL IMPRESSION: Today's session continued to focus on lower extremity strengthening, gait and balance training. Good challenge with foam balance work today.  Demonstrates improving ability to correct her balance with balance challenges. Good challenge with stepping over obstacles; needs to stop to resume correct sequencing with cane during ambulation today occasionally.   Patient will continue to benefit from physical therapy in order to improve function and reduce impairment.   PROGRESS NOTE 07/10/22: Patient demonstrated continued improvements in function as indicated by positive significant changes in , 5TSTS, and strength. However, patient still presents with deficits in vestibular function which greatly affects her standing balance. Patient still presents as a falls risk based on the Tinetti score. With this, skilled PT is still required to address the impairments and functional limitations listed below.  Interventions today were geared towards gaze stabilization incorporated with her standing balance. Patient tolerated gaze stabilization activities with mild dizziness and unsteadiness on the saccades, VOR 1 and cancellation exercises. Demonstrated appropriate levels of fatigue. Rest periods provided. Provided slight amount of cueing to ensure correct execution of activity with good carry-over.    OBJECTIVE IMPAIRMENTS: Abnormal gait, decreased balance, decreased coordination, decreased endurance, difficulty walking, decreased strength, impaired flexibility, and pain.   ACTIVITY LIMITATIONS: carrying, lifting, bending,  standing, squatting, stairs, transfers, continence, bathing, toileting, dressing, self feeding, hygiene/grooming, and locomotion level  PARTICIPATION LIMITATIONS: meal prep, cleaning, laundry, driving, shopping, community activity, and yard work  PERSONAL FACTORS: Fitness level, Bladder implant (will have surgery 07/26/2022), hx of B THR, lobectomy are also affecting patient's functional outcome.   REHAB POTENTIAL: Fair    CLINICAL DECISION MAKING: Evolving/moderate complexity  EVALUATION COMPLEXITY: Moderate   GOALS: Goals reviewed with patient? Yes  SHORT TERM GOALS:  Target date: 07/13/2022  Pt will demonstrate indep in HEP to facilitate carry-over of skilled services and improve functional outcomes Goal status: MET  LONG TERM GOALS: Target date: 08/10/2022  Pt will be report little to no dizziness on VOR 1 and head impulse test to facilitate safety on ambulation Baseline: with dizziness Goal status: INITIAL/NEW   Pt will decrease 5TSTS by at least 5 seconds in order to demonstrate clinically significant improvement in LE strength  Baseline: 18.88 sec Goal status: REVISED  3.  Patient will demonstrate increase in Tinetti Score by 8 points in order to demonstrate clinically significant improvement in balance and decreased risk for falls  Baseline: 9 Goal status: IN PROGRESS  4.  Pt will increase by at least 40 ft in order to demonstrate clinically significant improvement in community ambulation Baseline: 304 ft Goal status: REVISED  5.  Pt will demonstrate increase in LE strength to 4 to facilitate ease and safety in ambulation Baseline: 2+ Goal status: IN PROGRESS  6.  Pt will demonstrate improved LE coordination as manifested by performing 3/4 correct trials in heel-to-shin to facilitate ease in ambulation Baseline: impaired Goal status: INITIAL  7.  Pt will be able to walk with a quad cane for > 50 ft Baseline: walks with a rollator Goal status:  REVISED   PLAN:  PT FREQUENCY: 3x/week  PT DURATION: 4 weeks  PLANNED INTERVENTIONS: Therapeutic exercises, Therapeutic activity, Neuromuscular re-education, Balance training, Gait training, Patient/Family education, Self Care, and Stair training  PLAN FOR NEXT SESSION: Continue POC and may progress as tolerated with emphasis on balance, LE strengthening, flexibility, and vestibular function.   10:29 AM, 08/03/22 Analysia Dungee Small Tereso Unangst MPT [facility] [place] [phone]

## 2022-08-06 ENCOUNTER — Ambulatory Visit (HOSPITAL_COMMUNITY): Payer: Medicare Other

## 2022-08-06 DIAGNOSIS — R262 Difficulty in walking, not elsewhere classified: Secondary | ICD-10-CM | POA: Diagnosis not present

## 2022-08-06 DIAGNOSIS — M25551 Pain in right hip: Secondary | ICD-10-CM

## 2022-08-06 NOTE — Therapy (Addendum)
OUTPATIENT PHYSICAL THERAPY LOWER EXTREMITY TREATMENT NOTE/ PROGRESS NOTE Progress Note Reporting Period 06/15/2022 to 08/06/2022  See note below for Objective Data and Assessment of Progress/Goals.       Patient Name: [name] MRN: [id number] DOB:1965-05-15, 57 y.o., female Today's Date: 08/06/2022  END OF SESSION:  PT End of Session - 08/06/22 1438     Visit Number 20    Number of Visits 28    Date for PT Re-Evaluation 08/31/22    Authorization Type BCBS Medicare, Medicaid of [place]    Progress Note Due on Visit 28    PT Start Time 0240    PT Stop Time 0320    PT Time Calculation (min) 40 min    Equipment Utilized During Treatment Gait belt;Oxygen    Activity Tolerance Patient tolerated treatment well    Behavior During Therapy WFL for tasks assessed/performed             Past Surgical History:  Procedure Laterality Date   ABDOMINAL HYSTERECTOMY     with bladder suspension   CERVICAL FUSION     CHOLECYSTECTOMY     COLONOSCOPY N/A 07/30/2013   Procedure: COLONOSCOPY;  Surgeon: Malissa Hippo, MD;  Location: [facility];  Service: Endoscopy;  Laterality: N/A;  930   EXAM UNDER ANESTHESIA WITH MANIPULATION OF SHOULDER Right 10/11/2016   Procedure: EXAM UNDER ANESTHESIA WITH MANIPULATION OF SHOULDER;  Surgeon: Vickki Hearing, MD;  Location: [facility];  Service: Orthopedics;  Laterality: Right;   HERNIA REPAIR     INCISIONAL HERNIA REPAIR N/A 07/30/2016   Procedure: HERNIA REPAIR INCISIONAL WITH MESH;  Surgeon: [name], MD;  Location: [facility];  Service: General;  Laterality: N/A;   INTERSTIM IMPLANT PLACEMENT  02/02/2021   Procedure: Leane Platt IMPLANT FIRST STAGE LEFT SACRUM ;  Surgeon: Malen Gauze, MD;  Location: [facility];  Service: Urology;;   Leane Platt IMPLANT PLACEMENT N/A 02/23/2021   Procedure: Leane Platt IMPLANT SECOND STAGE;  Surgeon: Malen Gauze, MD;  Location: [facility];  Service: Urology;  Laterality: N/A;  Rep coming, time needs to  stay at 2:00   JOINT REPLACEMENT     LOBECTOMY     right lung    POSTERIOR CERVICAL LAMINECTOMY Right 04/05/2017   Procedure: Right C6-7 Posterior cervical laminectomy;  Surgeon: Donalee Citrin, MD;  Location: [facility] OR;  Service: Neurosurgery;  Laterality: Right;  Right C6-7 Posterior cervical laminectomy   SHOULDER ARTHROSCOPY WITH ROTATOR CUFF REPAIR Right 10/11/2016   Procedure: SHOULDER ARTHROSCOPY;  Surgeon: Vickki Hearing, MD;  Location: [facility];  Service: Orthopedics;  Laterality: Right;   TOTAL HIP ARTHROPLASTY Right 04/15/2020   Procedure: RIGHT TOTAL HIP ARTHROPLASTY ANTERIOR APPROACH;  Surgeon: Kathryne Hitch, MD;  Location: [facility];  Service: Orthopedics;  Laterality: Right;   TOTAL HIP ARTHROPLASTY Left 07/22/2020   Procedure: LEFT  HIP ARTHROPLASTY ANTERIOR APPROACH;  Surgeon: Kathryne Hitch, MD;  Location: [facility];  Service: Orthopedics;  Laterality: Left;  RNFA   TUBAL LIGATION     Patient Active Problem List   Diagnosis Date Noted   Severe persistent asthma without complication 06/06/2022   Gait abnormality 05/28/2022   Adrenal insufficiency (HCC) 03/30/2022   Endocrine disorder, unspecified 07/04/2021   Prediabetes 07/04/2021   Hypothyroidism 07/04/2021   Former smoker 06/29/2021   Seasonal and perennial allergic rhinitis 06/19/2021   Oxygen dependent 06/19/2021   Steroid dependent (HCC) 06/19/2021   Immunosuppressed status (HCC) 06/19/2021   Radiculopathy, lumbar region 12/08/2020  Status post left hip replacement 07/22/2020   Status post right hip replacement 05/02/2020   Status post total replacement of right hip 04/15/2020   Avascular necrosis of bone of left hip (HCC) 02/10/2020   Avascular necrosis of bone of right hip (HCC) 02/10/2020   Allergic rhinitis 05/21/2019   Asthma-COPD overlap syndrome 04/30/2019   Chronic respiratory failure with hypoxia (HCC) 04/30/2019   Spinal stenosis of cervical region 04/05/2017   Partial tear of right  subscapularis tendon    Incisional hernia, without obstruction or gangrene    Urinary frequency 03/21/2012   Migraine equivalent syndrome 03/21/2012   Insomnia 03/21/2012    PCP: Benita Stabile, MD  REFERRING PROVIDER: Levert Feinstein, MD  REFERRING DIAG: R26.9 (ICD-10-CM) - Gait abnormality  THERAPY DIAG:  Difficulty in walking, not elsewhere classified  Pain in right hip  Rationale for Evaluation and Treatment: Rehabilitation  ONSET DATE: April 12, 2022  SUBJECTIVE:   SUBJECTIVE STATEMENT: Still seeing ENT 5/1; Overall "40 to 45%" better; still feeling tired; her MD upped her levothyroxine  PROGRESS NOTE 07/10/22: Patient states that she went to her neurologist and was diagnosed with vestibular neuritis. Patient says she will be referred to a neurologist. Patient states that she's still dizzy ("wavy" sensation) but not as bad as she used to be. Patient states that her balance is still off but has improved a little since she started PT. Denies any recent falls. Patient thinks she has improved around 25%. With this, patient wishes to continue with PT.   PERTINENT HISTORY: Bladder implant (will have surgery 07/26/2022), hx of B THR, lobectomy PAIN:  Are you having pain? No  PRECAUTIONS: Fall and Other: uses portable O2  WEIGHT BEARING RESTRICTIONS: No  FALLS:  Has patient fallen in last 6 months? Yes. Number of falls 7  LIVING ENVIRONMENT: Lives with: lives with their spouse Lives in: House/apartment Stairs: Yes: External: 1 steps; none Has following equipment at home: Single point cane, Walker - 2 wheeled, Environmental consultant - 4 wheeled, shower chair, bed side commode, and oxygen  OCCUPATION: on disability  PLOF: Independent with household mobility with device and Requires assistive device for independence  PATIENT GOALS: "to be able to walk straight by myself"  NEXT MD VISIT: Aug 30, 2022  OBJECTIVE:   DIAGNOSTIC FINDINGS:  MRI HEAD WITHOUT CONTRAST 06/08/22    TECHNIQUE: Multiplanar, multiecho pulse sequences of the brain and surrounding structures were obtained without intravenous contrast.   COMPARISON:  Head CT 05/22/2022. Brain MRI 05/22/2011.   FINDINGS: Brain:   No age advanced or lobar predominant parenchymal atrophy.   Multifocal T2 FLAIR hyperintense signal abnormality within the cerebral white matter, overall mild but greater than expected for age.   There is no acute infarct.   No evidence of an intracranial mass.   No chronic intracranial blood products.   No extra-axial fluid collection.   No midline shift.   Vascular: Maintained flow voids within the proximal large arterial vessels.   Skull and upper cervical spine: No focal suspicious calvarial marrow lesion. Cervical spine findings separately reported on same day cervical spine MRI.   Sinuses/Orbits: No mass or acute finding within the imaged orbits. Prior bilateral ocular lens replacement. Trace mucosal thickening within left ethmoid air cells.   Other: Small-volume fluid within the bilateral mastoid air cells.   IMPRESSION: 1.  No evidence of an acute intracranial abnormality. 2. Multifocal T2 FLAIR hyperintense signal abnormality within the cerebral white matter, overall mild but greater than expected for  age. These signal changes have slightly progressed from the prior brain MRI of 05/22/2011. Findings are nonspecific and differential considerations include chronic small vessel ischemic disease, sequelae of chronic migraine headaches, sequelae of a prior infectious/inflammatory process or sequelae of a demyelinating process (such as multiple sclerosis), among others. No lesions specifically suggest demyelinating disease. 3. Small-volume fluid within the bilateral mastoid air cells.  MRI CERVICAL SPINE WITHOUT CONTRAST 06/08/22   TECHNIQUE: Multiplanar, multisequence MR imaging of the cervical spine was performed. No intravenous contrast was  administered.   COMPARISON:  Radiographs of the cervical spine 08/06/2017. Cervical spine MRI 02/24/2017.   FINDINGS: Alignment: Straightening of the expected cervical lordosis. 3 mm C7-T1 grade 1 anterolisthesis.   Vertebrae: Vertebral body height is maintained. Susceptibility artifact arising from ACDF hardware at the C4-C7 levels. No appreciable significant marrow edema or focal suspicious osseous lesion.   Cord: No signal abnormality identified within the cervical spinal cord.   Posterior Fossa, vertebral arteries, paraspinal tissues: Posterior fossa assessed on same-day brain MRI. Flow voids preserved within the imaged cervical vertebral arteries. No paraspinal mass or collection.   Disc levels:   Unless otherwise stated, the level by level findings below have not significantly changed from the prior MRI of 03/06/2017.   Disc degeneration at the non-operative levels, greatest at C3-C4 and C7-T1 (progressed at these levels).   C2-C3: Slight disc bulge. No significant spinal canal or foraminal stenosis.   C3-C4: Disc bulge. New superimposed small central disc protrusion (at site of posterior annular fissure). Uncovertebral hypertrophy on the right, new from the prior MRI. Facet arthrosis. Mild relative spinal canal narrowing (without spinal cord mass effect), new from the prior MRI. Moderate right neural foraminal narrowing, also new from the prior MRI.   C4-C5: Prior ACDF. No significant spinal canal or foraminal stenosis.   C5-C6: Prior ACDF. Uncovertebral hypertrophy on the right. No significant spinal canal stenosis. Mild right neural foraminal narrowing.   C6-C7: Prior ACDF. Uncovertebral vertebrae on the right. No significant spinal canal stenosis. Moderate right neural foraminal narrowing.   C7-T1: 3 mm grade 1 anterolisthesis. Mild disc uncovering. Facet arthrosis. No significant spinal canal stenosis. Mild-to-moderate bilateral neural foraminal  narrowing.   IMPRESSION: Prior C4-C7 ACDF. No significant spinal canal stenosis at these levels. As before, uncovertebral hypertrophy results in neural foraminal narrowing on the right at C5-C6 (mild), and on the right at C6-C7 (moderate).   Cervical spondylosis at the remaining levels, as outlined and with findings most notably as follows.   At C3-C4, there is a disc bulge. New superimposed small central disc protrusion (at site of posterior annular fissure). Uncovertebral hypertrophy on the right, new from the prior MRI. Facet arthrosis. Mild spinal canal narrowing, and moderate right neural foraminal narrowing, new from the prior MRI.   At C7-T1, there is 3 mm grade 1 anterolisthesis. Mild disc uncovering. Facet arthrosis. No significant spinal canal stenosis. Mild-to-moderate bilateral neural foraminal narrowing. Findings at this level have not significantly changed.  COGNITION: Overall cognitive status: Within functional limits for tasks assessed     SENSATION: Not tested  MUSCLE LENGTH: (not tested on 07/10/22) Mild restriction on B hamstrings Moderate restriction on B gastrocsoleus  POSTURE: rounded shoulders and forward head   LOWER EXTREMITY MMT  MMT Right eval Left eval Right 07/10/22 Left 07/10/22 Right 08/06/22 Left 08/06/22  Hip flexion 4 4 4 4  4+ 4+  Hip extension 2+ (bridging) 2+  (bridging) 3+  (bridging) 3+ (bridging) 4 4  Hip abduction 4 4  4 4 4+ 4+  Hip adduction        Hip internal rotation        Hip external rotation        Knee flexion 4- 4- 4 4 4  4+  Knee extension 4- 3+ 4 4 5 5   Ankle dorsiflexion 3+ 4- 4- 4- 4 4+  Ankle plantarflexion 3+ 3+ 3+ 3+    Ankle inversion        Ankle eversion         (Blank rows = not tested)  LOWER EXTREMITY ROM  Active ROM Right eval Left eval  Hip flexion [facility]  Hip extension    Hip abduction [facility]  Hip adduction [facility]  Hip internal rotation    Hip external rotation    Knee flexion [facility] WFL   Knee extension [facility]  Ankle dorsiflexion [facility] WFL  Ankle plantarflexion [facility] WFL  Ankle inversion    Ankle eversion     (Blank rows = not tested)  FUNCTIONAL TESTS:  5 times sit to stand: 18.88 sec (little assist from rollator) from 26.97 sec (has to hold on to rollator) 2 minute walk test: 304 ft (with rollator) from 224 ft (with a rollator) Tinetti POMA : 14 from 9 (balance + gait)  GAIT: Distance walked: 304 ft Assistive device utilized:  rollator Level of assistance: Modified independence Comments: foot clearance on B more evident, normal step length on B  VESTIBULAR ASSESSMENT  06/21/22 07/10/22  Head impulse test positive with corrective saccades, dizziness reported positive with corrective saccades, dizziness reported  VOR 1 corrective saccades, dizziness reported corrective saccades, dizziness reported  Optokinetic reflex (R/L)  impaired, dizziness reported  impaired, dizziness reported    TODAY'S TREATMENT:                                                                                                                              DATE:  08/06/2022 Progress note 5 times sit to stand 18.32 sec no UE assist; CGA for safety 2 min walk test with rollator 343 ft; 131 ft with QC and CGA MMT's see above  08/03/2022 Sit to stand with red ball 2 x 10 Foam balance with UE movement with dowel bilateral shoulder flexion and then trunk rotation x 8 each Foam marching 2 x 10 Tandem stance 2 x 30" each Gait training with SBQC x 225 ft; step over low hurdles and around cones   08/01/2022  -Tandem stance 2 x 1 min bilaterally; intermittently UE touching -Modified single leg balnce standing with blue balance beam with UE 1lb passing 3 x 12 repetitions. Use of bodyblade for coordination skill practice. -8in stepups 6in box and 2in blue foam pad 2 x 8-10. Cues for intermittent HHA on parallel bars.  -262ft SBQC ambulation; CGA--->supervision level   07/30/22 Sit to stand x 10 no UE  assist Sit to stand holding green med ball 1 x 10  Sit  to stand holding red med ball x 10 Standing: 8" step ups x 10 each with intermittent 2 hand assist Tandem stance on foam 2 x 10" each Heel/toe raises on incline with no UE assist x 10 Small base of support x 30" no UE assist  Ambulation with SBQC x 230 ft with CGA   07/27/22 Sit to stand 2 x 10 no UE assist Sitting: Diaphragmatic breathing review education and performance  Standing: Heel taps x 10 each no UE assist 4" step ups x 5 each no UE assist  Gait training with large base quad cane with CGA x 225 ft   07/25/22 Gait with large base quad cane 300 feet with CGA STS 1 x 10 Anterior stepping with bilateral overhead reach 1 x 15 bilateral   07/23/22 Diaphragmatic breathing - education and performance - 5 minutes Step up 6 inch 2 x 10 bilateral unilateral HHA LAQ 10 x 5 second holds Standing hamstring curls 2# 2 x 10 Lateral stepping 3 x 8 feet bilateral with CGA Gait with large base quad cane 40 feet with CGA   07/20/2022 Cervical neck AROM flex/ext, rot x 10 Gastrocnemius slant board stretch x 30" x 3 Sit-to-stand, (emphasis on scooting, heel placement back, and trunk momentum), CGA x 10 Standing, firm surface, normal BOS:  Horizontal saccades, while reading words x 1'  Vertical saccades, while reading numbers x 1'  Horizontal smooth pursuit, identifying animal silhouettes x 1'  Vertical smooth pursuit, identifying/counting colors/shapes x 1'  Heel/toe raises, x 2 lbs no UE support x 10 x 2  Tandem stance x 30" x 2, eyes open, light UE support  Rhytmic stabilization x ant/post x 3" x 10 x 2, no UE support  Rocking front/back, L arm swing, R LE forward x 30", R UE support  Mini squats 3" x 10 x 2, no UE support  TKE, GTB x 10 x 2 x 3"  Hip vectors x 10 x 2, GTB  07/19/2022 Cervical neck AROM flex/ext, rot x 10 Gastrocnemius slant board stretch x 30" x 3 Sit-to-stand, (emphasis on scooting, heel placement  back, and trunk momentum), CGA x 10 Standing, firm surface, normal BOS:  Horizontal saccades, while reading words x 1'  Vertical saccades, while reading numbers x 1'  Horizontal smooth pursuit, identifying animal silhouettes x 1'  Vertical smooth pursuit, identifying/counting colors/shapes x 1'  VOR 1, own pace, identifying colors x 1'  VOR cancellation, reading numbers x 30"  Heel/toe raises, no UE support x 10 x 2  Tandem stance x 30" x 2, eyes open, light UE support  Rhytmic stabilization x ant/post x 3" x 10 x 2, no UE support  Rocking front/back, L arm swing, R LE forward x 30", R UE support  Mini squats 3" x 10 x 2, no UE support  TKE, GTB x 10 x 2 x 3"  Hip vectors x 10 x 2, GTB  07/17/22 Standing HR/TR 2 x 10 Step up 4 inch 1 x 10 bilateral UE support, 1 x 10 unilateral UE support bilateral  Rhytmic stabilization x ant/post and lateral x 3" x 10 x 1, no UE support Lateral stepping 4 x 8 feet bilateral CGA Hip vectors GTB at calf 2 x 5 bilateral  Sit-to-stand, seat slightly elevated (emphasis on scooting, heel placement back, and trunk momentum), CGA x 10; 5 x with emphasis on corkscrew of feet  07/13/2022 Cervical neck AROM flex/ext, rot x 10 Gastrocnemius slant board stretch x 30" x 3  Sit-to-stand, seat slightly elevated (emphasis on scooting, heel placement back, and trunk momentum), CGA x 10 Standing, firm surface, normal BOS:  Horizontal saccades, while reading words x 1'  Vertical saccades, while reading numbers x 1'  Horizontal smooth pursuit, identifying animal silhouettes x 1'  Vertical smooth pursuit, identifying/counting colors/shapes x 1'  VOR 1, own pace, identifying colors x 1'  VOR cancellation, identifying shapes x 30"  Heel/toe raises, no UE support x 10 x 2  Tandem stance x 30" x 2, eyes open, light UE support Static standing, eyes open x 30" x 2, no UE support  Rhytmic stabilization x ant/post x 3" x 10 x 2, no UE support  Rocking front/back, L arm  swing, R LE forward x 30", R UE support  Mini squats 3" x 10 x 2, no UE support  TKE, GTB x 10 x 2 x 3"  Hip vectors x 10 x 2, RTB  07/12/2022 Cervical neck AROM flex/ext, rot x 10 Gastrocnemius slant board stretch x 30" x 3 Sit-to-stand, seat slightly elevated (emphasis on scooting, heel placement back, and trunk momentum), CGA Standing, firm surface, normal BOS:  Horizontal saccades, while reading words x 1'  Vertical saccades, while reading words x 1'  Horizontal smooth pursuit, identifying animal silhouettes x 1'  Vertical smooth pursuit, identifying colors x 1'  VOR 1, own pace, identifying numbers x 1'  VOR cancellation, identifying shapes x 30"  Heel/toe raises with 1 UE support x 10 x 2  Tandem stance x 30" x 2, eyes open, light UE support Static standing, eyes open x 30" x 2 Static standing with head turns x 30" x 2  Rhytmic stabilization x ant/post x 3" x 10 x 2  Mini squats 3" x 10 x 2, no UE support  TKE, RTB x 10 x 2 x 3"  Hip ext x 10 x 2, RTB  07/10/2022 Progress note ( , Tinetti, vestibular exam, ROM, MMT, 5TSTS) Cervical neck AROM flex/ext, rot x 10 Standing, firm surface, normal BOS:  Horizontal saccades, while reading words x 1'  Vertical saccades, while identifying numbers x 1'  Horizontal smooth pursuit, identifying color/shapes x 1'  Vertical smooth pursuit, identifying animal silhouettes x 1'  VOR 1, own pace, identifying numbers x 1'      PATIENT EDUCATION:  Education details: Updated HEP. Instructed patient to perform standing exercises on with a stable support (e.g. kitchen counter) and have her husband next to her. Educated on possible anxiety related/multiple systems contributing to symptoms Person educated: Patient Education method: Explanation Education comprehension: verbalized understanding  HOME EXERCISE PROGRAM: Access Code: [email or web address] URL: [email or web address]  07/25/22 - Forward Step with Same Side Arm Raise  - 1 x  daily - 7 x weekly - 2 sets - 10 reps  07/23/22- Supine Diaphragmatic Breathing  - 1 x daily - 7 x weekly - 10 reps - Seated Diaphragmatic Breathing  - 1 x daily - 7 x weekly - 10 reps - Step Up  - 1-2 x daily - 7 x weekly - 2 sets - 10 reps  07/17/22 - Sit to Stand with Arms Crossed  - 1 x daily - 7 x weekly - 1-2 sets - 10 reps  07/13/22: - Standing 3-Way Leg Reach with Resistance at Ankles and Counter Support  - 1 x daily - 7 x weekly - 2 sets - 10 reps  07/02/22 hip abduction, small bos standing and regular standing head turns and nods  06/28/2022 - Mini Squat with  Counter Support  - 1-2 x daily - 5-7 x weekly - 2 sets - 10 reps - 3 hold - Standing Hip Extension with Counter Support  - 1-2 x daily - 5-7 x weekly - 2 sets - 10 reps  Date: 06/27/2022 Prepared by: Krystal Clark  Exercises - Seated Calf Stretch with Strap  - 1-2 x daily - 5-7 x weekly - 3 reps - 30 hold - Standing Tandem Balance with Counter Support  - 1-2 x daily - 5-7 x weekly - 30 hold - Heel Toe Raises with Counter Support  - 1-2 x daily - 5-7 x weekly - 2 sets - 10 reps  06/21/2022 GAZE STABILIZATION EXERCISES (2-3x/day, 5-7 days/week, seated) These exercises are designed to improve your eyes' ability to track objects without getting dizzy. When performing these exercises, make sure you are facing a blank wall (like a white wall) to avoid distraction. You can wear your glasses/contact lenses if you must.   Before anything, warm-up your neck muscles by bending it forward and back 10 times, side-to-side 10 times, and slowly turn your head left and right 10 time Exercise 1 (Horizontal Saccades) Use two cards for this exercise While holding the cards on each hand side by side in sitting with arms outstretched, look at the first item on the other card then look at the next item on the second card then alternate your gaze sequentially. Do NOT move your cards nor the your head. Do the following for one minute  each Identifying numbers Identifying shapes Exercise 2 (Paradigm x1) Use only one card for this exercise While holding the card in sitting with your arm outstretched, turn your head left and right at your own pace while keeping the card stationary.  Do this for one minute each while you are: Identifying numbers Identifying shapes Exercise 3 (VOR cancellation) Use only one card for this exercise While holding the card in sitting with your arms outstretched, turn your body, arm, and head at the same time left and right at your own pace Do this for one minute each while you are: Identifying numbers Identifying shapes  3/11/20204 - VOR x 1 Habituation Exercises 3x daily - Sit/stands w/ 4.5lb weight at home 2 x 5 daily.  ASSESSMENT:  CLINICAL IMPRESSION: Progress note today.  Patient with good improvement with functional tests.  Would like to be able to walk independently with QC so will extend 2 x a week x 3 weeks to address remaining unmet and partially met goals.    Patient will continue to benefit from physical therapy in order to improve function and reduce impairment.   PROGRESS NOTE 07/10/22: Patient demonstrated continued improvements in function as indicated by positive significant changes in , 5TSTS, and strength. However, patient still presents with deficits in vestibular function which greatly affects her standing balance. Patient still presents as a falls risk based on the Tinetti score. With this, skilled PT is still required to address the impairments and functional limitations listed below.  Interventions today were geared towards gaze stabilization incorporated with her standing balance. Patient tolerated gaze stabilization activities with mild dizziness and unsteadiness on the saccades, VOR 1 and cancellation exercises. Demonstrated appropriate levels of fatigue. Rest periods provided. Provided slight amount of cueing to ensure correct execution of activity with good  carry-over.    OBJECTIVE IMPAIRMENTS: Abnormal gait, decreased balance, decreased coordination, decreased endurance, difficulty walking, decreased strength, impaired flexibility, and pain.   ACTIVITY LIMITATIONS: carrying, lifting, bending, standing, squatting, stairs,  transfers, continence, bathing, toileting, dressing, self feeding, hygiene/grooming, and locomotion level  PARTICIPATION LIMITATIONS: meal prep, cleaning, laundry, driving, shopping, community activity, and yard work  PERSONAL FACTORS: Fitness level, Bladder implant (will have surgery 07/26/2022), hx of B THR, lobectomy are also affecting patient's functional outcome.   REHAB POTENTIAL: Fair    CLINICAL DECISION MAKING: Evolving/moderate complexity  EVALUATION COMPLEXITY: Moderate   GOALS: Goals reviewed with patient? Yes  SHORT TERM GOALS: Target date: 07/13/2022  Pt will demonstrate indep in HEP to facilitate carry-over of skilled services and improve functional outcomes Goal status: MET  LONG TERM GOALS: Target date: 08/31/2022  Pt will be report little to no dizziness on VOR 1 and head impulse test to facilitate safety on ambulation Baseline: with dizziness Goal status: INITIAL/NEW   Pt will decrease 5TSTS by at least 5 seconds in order to demonstrate clinically significant improvement in LE strength  Baseline: 18.88 sec Goal status: REVISED  3.  Patient will demonstrate increase in Tinetti Score by 8 points in order to demonstrate clinically significant improvement in balance and decreased risk for falls  Baseline: 9 Goal status: IN PROGRESS  4.  Pt will increase by at least 40 ft in order to demonstrate clinically significant improvement in community ambulation Baseline: 304 ft; 343 ft with rollator 4/29 Goal status: REVISED  5.  Pt will demonstrate increase in LE strength to 4 to facilitate ease and safety in ambulation Baseline: 2+ Goal status: MET  6.  Pt will demonstrate improved LE  coordination as manifested by performing 3/4 correct trials in heel-to-shin to facilitate ease in ambulation Baseline: impaired Goal status: INITIAL  7.  Pt will be able to walk x 224 ft in 2 minutes with a quad cane Baseline: walks with a rollator Goal status: REVISED 08/06/22   PLAN:  PT FREQUENCY: 3x/week  PT DURATION: 4 weeks  PLANNED INTERVENTIONS: Therapeutic exercises, Therapeutic activity, Neuromuscular re-education, Balance training, Gait training, Patient/Family education, Self Care, and Stair training  PLAN FOR NEXT SESSION: Continue 2 x a week for 3 weeks; may progress as tolerated with emphasis on balance, LE strengthening, flexibility, and vestibular function.   3:33 PM, 08/06/22 Vyron Fronczak Small Lainey Nelson MPT [facility] [place] [phone]

## 2022-08-07 ENCOUNTER — Ambulatory Visit (HOSPITAL_COMMUNITY): Payer: Medicare Other | Admitting: Physical Therapy

## 2022-08-07 ENCOUNTER — Encounter (HOSPITAL_COMMUNITY): Payer: Self-pay | Admitting: Physical Therapy

## 2022-08-07 DIAGNOSIS — R262 Difficulty in walking, not elsewhere classified: Secondary | ICD-10-CM

## 2022-08-07 DIAGNOSIS — M25551 Pain in right hip: Secondary | ICD-10-CM

## 2022-08-07 NOTE — Therapy (Signed)
OUTPATIENT PHYSICAL THERAPY LOWER EXTREMITY TREATMENT NOTE/ PROGRESS NOTE      Patient Name: [name] MRN: [id number] DOB:Sep 16, 1965, 57 y.o., female Today's Date: 08/07/2022  END OF SESSION:  PT End of Session - 08/07/22 1306     Visit Number 21    Number of Visits 28    Date for PT Re-Evaluation 08/31/22    Authorization Type BCBS Medicare, Medicaid of [place]    Progress Note Due on Visit 28    PT Start Time 1306    PT Stop Time 1349    PT Time Calculation (min) 43 min    Equipment Utilized During Treatment Gait belt;Oxygen    Activity Tolerance Patient tolerated treatment well    Behavior During Therapy WFL for tasks assessed/performed             Past Surgical History:  Procedure Laterality Date   ABDOMINAL HYSTERECTOMY     with bladder suspension   CERVICAL FUSION     CHOLECYSTECTOMY     COLONOSCOPY N/A 07/30/2013   Procedure: COLONOSCOPY;  Surgeon: Malissa Hippo, MD;  [facility]: [facility];  Service: Endoscopy;  Laterality: N/A;  930   EXAM UNDER ANESTHESIA WITH MANIPULATION OF SHOULDER Right 10/11/2016   Procedure: EXAM UNDER ANESTHESIA WITH MANIPULATION OF SHOULDER;  Surgeon: Vickki Hearing, MD;  Location: [facility];  Service: Orthopedics;  Laterality: Right;   HERNIA REPAIR     INCISIONAL HERNIA REPAIR N/A 07/30/2016   Procedure: HERNIA REPAIR INCISIONAL WITH MESH;  Surgeon: [name], MD;  Location: [facility];  Service: General;  Laterality: N/A;   INTERSTIM IMPLANT PLACEMENT  02/02/2021   Procedure: Leane Platt IMPLANT FIRST STAGE LEFT SACRUM ;  Surgeon: Malen Gauze, MD;  Location: [facility];  Service: Urology;;   Leane Platt IMPLANT PLACEMENT N/A 02/23/2021   Procedure: Leane Platt IMPLANT SECOND STAGE;  Surgeon: Malen Gauze, MD;  Location: [facility];  Service: Urology;  Laterality: N/A;  Rep coming, time needs to stay at 2:00   JOINT REPLACEMENT     LOBECTOMY     right lung    POSTERIOR CERVICAL LAMINECTOMY Right 04/05/2017    Procedure: Right C6-7 Posterior cervical laminectomy;  Surgeon: Donalee Citrin, MD;  Location: [facility];  Service: Neurosurgery;  Laterality: Right;  Right C6-7 Posterior cervical laminectomy   SHOULDER ARTHROSCOPY WITH ROTATOR CUFF REPAIR Right 10/11/2016   Procedure: SHOULDER ARTHROSCOPY;  Surgeon: Vickki Hearing, MD;  Location: [facility];  Service: Orthopedics;  Laterality: Right;   TOTAL HIP ARTHROPLASTY Right 04/15/2020   Procedure: RIGHT TOTAL HIP ARTHROPLASTY ANTERIOR APPROACH;  Surgeon: Kathryne Hitch, MD;  Location: [facility];  Service: Orthopedics;  Laterality: Right;   TOTAL HIP ARTHROPLASTY Left 07/22/2020   Procedure: LEFT  HIP ARTHROPLASTY ANTERIOR APPROACH;  Surgeon: Kathryne Hitch, MD;  Location: [facility];  Service: Orthopedics;  Laterality: Left;  RNFA   TUBAL LIGATION     Patient Active Problem List   Diagnosis Date Noted   Severe persistent asthma without complication 06/06/2022   Gait abnormality 05/28/2022   Adrenal insufficiency (HCC) 03/30/2022   Endocrine disorder, unspecified 07/04/2021   Prediabetes 07/04/2021   Hypothyroidism 07/04/2021   Former smoker 06/29/2021   Seasonal and perennial allergic rhinitis 06/19/2021   Oxygen dependent 06/19/2021   Steroid dependent (HCC) 06/19/2021   Immunosuppressed status (HCC) 06/19/2021   Radiculopathy, lumbar region 12/08/2020   Status post left hip replacement 07/22/2020   Status post right hip replacement 05/02/2020   Status post total  replacement of right hip 04/15/2020   Avascular necrosis of bone of left hip (HCC) 02/10/2020   Avascular necrosis of bone of right hip (HCC) 02/10/2020   Allergic rhinitis 05/21/2019   Asthma-COPD overlap syndrome 04/30/2019   Chronic respiratory failure with hypoxia (HCC) 04/30/2019   Spinal stenosis of cervical region 04/05/2017   Partial tear of right subscapularis tendon    Incisional hernia, without obstruction or gangrene    Urinary frequency 03/21/2012   Migraine  equivalent syndrome 03/21/2012   Insomnia 03/21/2012    PCP: Benita Stabile, MD  REFERRING PROVIDER: Levert Feinstein, MD  REFERRING DIAG: R26.9 (ICD-10-CM) - Gait abnormality  THERAPY DIAG:  Difficulty in walking, not elsewhere classified  Pain in right hip  Rationale for Evaluation and Treatment: Rehabilitation  ONSET DATE: April 12, 2022  SUBJECTIVE:   SUBJECTIVE STATEMENT: Patient states everything going well. Wants to progress with everything as able. Pain in R calf and R hip.  PROGRESS NOTE 07/10/22: Patient states that she went to her neurologist and was diagnosed with vestibular neuritis. Patient says she will be referred to a neurologist. Patient states that she's still dizzy ("wavy" sensation) but not as bad as she used to be. Patient states that her balance is still off but has improved a little since she started PT. Denies any recent falls. Patient thinks she has improved around 25%. With this, patient wishes to continue with PT.   PERTINENT HISTORY: Bladder implant (will have surgery 07/26/2022), hx of B THR, lobectomy PAIN:  Are you having pain? No  PRECAUTIONS: Fall and Other: uses portable O2  WEIGHT BEARING RESTRICTIONS: No  FALLS:  Has patient fallen in last 6 months? Yes. Number of falls 7  LIVING ENVIRONMENT: Lives with: lives with their spouse Lives in: House/apartment Stairs: Yes: External: 1 steps; none Has following equipment at home: Single point cane, Walker - 2 wheeled, Environmental consultant - 4 wheeled, shower chair, bed side commode, and oxygen  OCCUPATION: on disability  PLOF: Independent with household mobility with device and Requires assistive device for independence  PATIENT GOALS: "to be able to walk straight by myself"  NEXT MD VISIT: Aug 30, 2022  OBJECTIVE:   DIAGNOSTIC FINDINGS:  MRI HEAD WITHOUT CONTRAST 06/08/22   TECHNIQUE: Multiplanar, multiecho pulse sequences of the brain and surrounding structures were obtained without intravenous  contrast.   COMPARISON:  Head CT 05/22/2022. Brain MRI 05/22/2011.   FINDINGS: Brain:   No age advanced or lobar predominant parenchymal atrophy.   Multifocal T2 FLAIR hyperintense signal abnormality within the cerebral white matter, overall mild but greater than expected for age.   There is no acute infarct.   No evidence of an intracranial mass.   No chronic intracranial blood products.   No extra-axial fluid collection.   No midline shift.   Vascular: Maintained flow voids within the proximal large arterial vessels.   Skull and upper cervical spine: No focal suspicious calvarial marrow lesion. Cervical spine findings separately reported on same day cervical spine MRI.   Sinuses/Orbits: No mass or acute finding within the imaged orbits. Prior bilateral ocular lens replacement. Trace mucosal thickening within left ethmoid air cells.   Other: Small-volume fluid within the bilateral mastoid air cells.   IMPRESSION: 1.  No evidence of an acute intracranial abnormality. 2. Multifocal T2 FLAIR hyperintense signal abnormality within the cerebral white matter, overall mild but greater than expected for age. These signal changes have slightly progressed from the prior brain MRI of 05/22/2011. Findings are nonspecific  and differential considerations include chronic small vessel ischemic disease, sequelae of chronic migraine headaches, sequelae of a prior infectious/inflammatory process or sequelae of a demyelinating process (such as multiple sclerosis), among others. No lesions specifically suggest demyelinating disease. 3. Small-volume fluid within the bilateral mastoid air cells.  MRI CERVICAL SPINE WITHOUT CONTRAST 06/08/22   TECHNIQUE: Multiplanar, multisequence MR imaging of the cervical spine was performed. No intravenous contrast was administered.   COMPARISON:  Radiographs of the cervical spine 08/06/2017. Cervical spine MRI 02/24/2017.   FINDINGS: Alignment:  Straightening of the expected cervical lordosis. 3 mm C7-T1 grade 1 anterolisthesis.   Vertebrae: Vertebral body height is maintained. Susceptibility artifact arising from ACDF hardware at the C4-C7 levels. No appreciable significant marrow edema or focal suspicious osseous lesion.   Cord: No signal abnormality identified within the cervical spinal cord.   Posterior Fossa, vertebral arteries, paraspinal tissues: Posterior fossa assessed on same-day brain MRI. Flow voids preserved within the imaged cervical vertebral arteries. No paraspinal mass or collection.   Disc levels:   Unless otherwise stated, the level by level findings below have not significantly changed from the prior MRI of 03/06/2017.   Disc degeneration at the non-operative levels, greatest at C3-C4 and C7-T1 (progressed at these levels).   C2-C3: Slight disc bulge. No significant spinal canal or foraminal stenosis.   C3-C4: Disc bulge. New superimposed small central disc protrusion (at site of posterior annular fissure). Uncovertebral hypertrophy on the right, new from the prior MRI. Facet arthrosis. Mild relative spinal canal narrowing (without spinal cord mass effect), new from the prior MRI. Moderate right neural foraminal narrowing, also new from the prior MRI.   C4-C5: Prior ACDF. No significant spinal canal or foraminal stenosis.   C5-C6: Prior ACDF. Uncovertebral hypertrophy on the right. No significant spinal canal stenosis. Mild right neural foraminal narrowing.   C6-C7: Prior ACDF. Uncovertebral vertebrae on the right. No significant spinal canal stenosis. Moderate right neural foraminal narrowing.   C7-T1: 3 mm grade 1 anterolisthesis. Mild disc uncovering. Facet arthrosis. No significant spinal canal stenosis. Mild-to-moderate bilateral neural foraminal narrowing.   IMPRESSION: Prior C4-C7 ACDF. No significant spinal canal stenosis at these levels. As before, uncovertebral hypertrophy  results in neural foraminal narrowing on the right at C5-C6 (mild), and on the right at C6-C7 (moderate).   Cervical spondylosis at the remaining levels, as outlined and with findings most notably as follows.   At C3-C4, there is a disc bulge. New superimposed small central disc protrusion (at site of posterior annular fissure). Uncovertebral hypertrophy on the right, new from the prior MRI. Facet arthrosis. Mild spinal canal narrowing, and moderate right neural foraminal narrowing, new from the prior MRI.   At C7-T1, there is 3 mm grade 1 anterolisthesis. Mild disc uncovering. Facet arthrosis. No significant spinal canal stenosis. Mild-to-moderate bilateral neural foraminal narrowing. Findings at this level have not significantly changed.  COGNITION: Overall cognitive status: Within functional limits for tasks assessed     SENSATION: Not tested  MUSCLE LENGTH: (not tested on 07/10/22) Mild restriction on B hamstrings Moderate restriction on B gastrocsoleus  POSTURE: rounded shoulders and forward head   LOWER EXTREMITY MMT  MMT Right eval Left eval Right 07/10/22 Left 07/10/22 Right 08/06/22 Left 08/06/22  Hip flexion 4 4 4 4  4+ 4+  Hip extension 2+ (bridging) 2+  (bridging) 3+  (bridging) 3+ (bridging) 4 4  Hip abduction 4 4 4 4  4+ 4+  Hip adduction        Hip internal rotation  Hip external rotation        Knee flexion 4- 4- 4 4 4  4+  Knee extension 4- 3+ 4 4 5 5   Ankle dorsiflexion 3+ 4- 4- 4- 4 4+  Ankle plantarflexion 3+ 3+ 3+ 3+    Ankle inversion        Ankle eversion         (Blank rows = not tested)  LOWER EXTREMITY ROM  Active ROM Right eval Left eval  Hip flexion [facility]  Hip extension    Hip abduction [facility]  Hip adduction [facility]  Hip internal rotation    Hip external rotation    Knee flexion [facility] WFL  Knee extension [facility]  Ankle dorsiflexion [facility] WFL  Ankle plantarflexion [facility] WFL  Ankle inversion    Ankle eversion     (Blank rows  = not tested)  FUNCTIONAL TESTS:  5 times sit to stand: 18.88 sec (little assist from rollator) from 26.97 sec (has to hold on to rollator) 2 minute walk test: 304 ft (with rollator) from 224 ft (with a rollator) Tinetti POMA : 14 from 9 (balance + gait)  GAIT: Distance walked: 304 ft Assistive device utilized:  rollator Level of assistance: Modified independence Comments: foot clearance on B more evident, normal step length on B  VESTIBULAR ASSESSMENT  06/21/22 07/10/22  Head impulse test positive with corrective saccades, dizziness reported positive with corrective saccades, dizziness reported  VOR 1 corrective saccades, dizziness reported corrective saccades, dizziness reported  Optokinetic reflex (R/L)  impaired, dizziness reported  impaired, dizziness reported    TODAY'S TREATMENT:                                                                                                                              DATE:  08/07/22 Gait with SBQC 226 feet, SPC 450 feet Sit to stand holding yellow med ball 2 x 10  Lateral stepping 6 x 10 feet Seated piriformis stretch 2 x 20 second holds bilateral    08/06/2022 Progress note 5 times sit to stand 18.32 sec no UE assist; CGA for safety 2 min walk test with rollator 343 ft; 131 ft with QC and CGA MMT's see above  08/03/2022 Sit to stand with red ball 2 x 10 Foam balance with UE movement with dowel bilateral shoulder flexion and then trunk rotation x 8 each Foam marching 2 x 10 Tandem stance 2 x 30" each Gait training with SBQC x 225 ft; step over low hurdles and around cones   08/01/2022  -Tandem stance 2 x 1 min bilaterally; intermittently UE touching -Modified single leg balnce standing with blue balance beam with UE 1lb passing 3 x 12 repetitions. Use of bodyblade for coordination skill practice. -8in stepups 6in box and 2in blue foam pad 2 x 8-10. Cues for intermittent HHA on parallel bars.  -215ft SBQC ambulation; CGA--->supervision  level   07/30/22 Sit to stand x 10 no  UE assist Sit to stand holding green med ball 1 x 10  Sit to stand holding red med ball x 10 Standing: 8" step ups x 10 each with intermittent 2 hand assist Tandem stance on foam 2 x 10" each Heel/toe raises on incline with no UE assist x 10 Small base of support x 30" no UE assist  Ambulation with SBQC x 230 ft with CGA   07/27/22 Sit to stand 2 x 10 no UE assist Sitting: Diaphragmatic breathing review education and performance  Standing: Heel taps x 10 each no UE assist 4" step ups x 5 each no UE assist  Gait training with large base quad cane with CGA x 225 ft   07/25/22 Gait with large base quad cane 300 feet with CGA STS 1 x 10 Anterior stepping with bilateral overhead reach 1 x 15 bilateral   07/23/22 Diaphragmatic breathing - education and performance - 5 minutes Step up 6 inch 2 x 10 bilateral unilateral HHA LAQ 10 x 5 second holds Standing hamstring curls 2# 2 x 10 Lateral stepping 3 x 8 feet bilateral with CGA Gait with large base quad cane 40 feet with CGA   07/20/2022 Cervical neck AROM flex/ext, rot x 10 Gastrocnemius slant board stretch x 30" x 3 Sit-to-stand, (emphasis on scooting, heel placement back, and trunk momentum), CGA x 10 Standing, firm surface, normal BOS:  Horizontal saccades, while reading words x 1'  Vertical saccades, while reading numbers x 1'  Horizontal smooth pursuit, identifying animal silhouettes x 1'  Vertical smooth pursuit, identifying/counting colors/shapes x 1'  Heel/toe raises, x 2 lbs no UE support x 10 x 2  Tandem stance x 30" x 2, eyes open, light UE support  Rhytmic stabilization x ant/post x 3" x 10 x 2, no UE support  Rocking front/back, L arm swing, R LE forward x 30", R UE support  Mini squats 3" x 10 x 2, no UE support  TKE, GTB x 10 x 2 x 3"  Hip vectors x 10 x 2, GTB  07/19/2022 Cervical neck AROM flex/ext, rot x 10 Gastrocnemius slant board stretch x 30" x  3 Sit-to-stand, (emphasis on scooting, heel placement back, and trunk momentum), CGA x 10 Standing, firm surface, normal BOS:  Horizontal saccades, while reading words x 1'  Vertical saccades, while reading numbers x 1'  Horizontal smooth pursuit, identifying animal silhouettes x 1'  Vertical smooth pursuit, identifying/counting colors/shapes x 1'  VOR 1, own pace, identifying colors x 1'  VOR cancellation, reading numbers x 30"  Heel/toe raises, no UE support x 10 x 2  Tandem stance x 30" x 2, eyes open, light UE support  Rhytmic stabilization x ant/post x 3" x 10 x 2, no UE support  Rocking front/back, L arm swing, R LE forward x 30", R UE support  Mini squats 3" x 10 x 2, no UE support  TKE, GTB x 10 x 2 x 3"  Hip vectors x 10 x 2, GTB  07/17/22 Standing HR/TR 2 x 10 Step up 4 inch 1 x 10 bilateral UE support, 1 x 10 unilateral UE support bilateral  Rhytmic stabilization x ant/post and lateral x 3" x 10 x 1, no UE support Lateral stepping 4 x 8 feet bilateral CGA Hip vectors GTB at calf 2 x 5 bilateral  Sit-to-stand, seat slightly elevated (emphasis on scooting, heel placement back, and trunk momentum), CGA x 10; 5 x with emphasis on corkscrew of feet  07/13/2022 Cervical  neck AROM flex/ext, rot x 10 Gastrocnemius slant board stretch x 30" x 3 Sit-to-stand, seat slightly elevated (emphasis on scooting, heel placement back, and trunk momentum), CGA x 10 Standing, firm surface, normal BOS:  Horizontal saccades, while reading words x 1'  Vertical saccades, while reading numbers x 1'  Horizontal smooth pursuit, identifying animal silhouettes x 1'  Vertical smooth pursuit, identifying/counting colors/shapes x 1'  VOR 1, own pace, identifying colors x 1'  VOR cancellation, identifying shapes x 30"  Heel/toe raises, no UE support x 10 x 2  Tandem stance x 30" x 2, eyes open, light UE support Static standing, eyes open x 30" x 2, no UE support  Rhytmic stabilization x ant/post x 3" x  10 x 2, no UE support  Rocking front/back, L arm swing, R LE forward x 30", R UE support  Mini squats 3" x 10 x 2, no UE support  TKE, GTB x 10 x 2 x 3"  Hip vectors x 10 x 2, RTB  07/12/2022 Cervical neck AROM flex/ext, rot x 10 Gastrocnemius slant board stretch x 30" x 3 Sit-to-stand, seat slightly elevated (emphasis on scooting, heel placement back, and trunk momentum), CGA Standing, firm surface, normal BOS:  Horizontal saccades, while reading words x 1'  Vertical saccades, while reading words x 1'  Horizontal smooth pursuit, identifying animal silhouettes x 1'  Vertical smooth pursuit, identifying colors x 1'  VOR 1, own pace, identifying numbers x 1'  VOR cancellation, identifying shapes x 30"  Heel/toe raises with 1 UE support x 10 x 2  Tandem stance x 30" x 2, eyes open, light UE support Static standing, eyes open x 30" x 2 Static standing with head turns x 30" x 2  Rhytmic stabilization x ant/post x 3" x 10 x 2  Mini squats 3" x 10 x 2, no UE support  TKE, RTB x 10 x 2 x 3"  Hip ext x 10 x 2, RTB  07/10/2022 Progress note ( , Tinetti, vestibular exam, ROM, MMT, 5TSTS) Cervical neck AROM flex/ext, rot x 10 Standing, firm surface, normal BOS:  Horizontal saccades, while reading words x 1'  Vertical saccades, while identifying numbers x 1'  Horizontal smooth pursuit, identifying color/shapes x 1'  Vertical smooth pursuit, identifying animal silhouettes x 1'  VOR 1, own pace, identifying numbers x 1'      PATIENT EDUCATION:  Education details: Updated HEP. Instructed patient to perform standing exercises on with a stable support (e.g. kitchen counter) and have her husband next to her. Educated on possible anxiety related/multiple systems contributing to symptoms Person educated: Patient Education method: Explanation Education comprehension: verbalized understanding  HOME EXERCISE PROGRAM: Access Code: [email or web address] URL: [email or web address]  07/25/22 -  Forward Step with Same Side Arm Raise  - 1 x daily - 7 x weekly - 2 sets - 10 reps  07/23/22- Supine Diaphragmatic Breathing  - 1 x daily - 7 x weekly - 10 reps - Seated Diaphragmatic Breathing  - 1 x daily - 7 x weekly - 10 reps - Step Up  - 1-2 x daily - 7 x weekly - 2 sets - 10 reps  07/17/22 - Sit to Stand with Arms Crossed  - 1 x daily - 7 x weekly - 1-2 sets - 10 reps  07/13/22: - Standing 3-Way Leg Reach with Resistance at Ankles and Counter Support  - 1 x daily - 7 x weekly - 2 sets - 10 reps  07/02/22 hip abduction, small bos  standing and regular standing head turns and nods  06/28/2022 - Mini Squat with Counter Support  - 1-2 x daily - 5-7 x weekly - 2 sets - 10 reps - 3 hold - Standing Hip Extension with Counter Support  - 1-2 x daily - 5-7 x weekly - 2 sets - 10 reps  Date: 06/27/2022 Prepared by: Krystal Clark  Exercises - Seated Calf Stretch with Strap  - 1-2 x daily - 5-7 x weekly - 3 reps - 30 hold - Standing Tandem Balance with Counter Support  - 1-2 x daily - 5-7 x weekly - 30 hold - Heel Toe Raises with Counter Support  - 1-2 x daily - 5-7 x weekly - 2 sets - 10 reps  06/21/2022 GAZE STABILIZATION EXERCISES (2-3x/day, 5-7 days/week, seated) These exercises are designed to improve your eyes' ability to track objects without getting dizzy. When performing these exercises, make sure you are facing a blank wall (like a white wall) to avoid distraction. You can wear your glasses/contact lenses if you must.   Before anything, warm-up your neck muscles by bending it forward and back 10 times, side-to-side 10 times, and slowly turn your head left and right 10 time Exercise 1 (Horizontal Saccades) Use two cards for this exercise While holding the cards on each hand side by side in sitting with arms outstretched, look at the first item on the other card then look at the next item on the second card then alternate your gaze sequentially. Do NOT move your cards nor the your  head. Do the following for one minute each Identifying numbers Identifying shapes Exercise 2 ([employer] x1) Use only one card for this exercise While holding the card in sitting with your arm outstretched, turn your head left and right at your own pace while keeping the card stationary.  Do this for one minute each while you are: Identifying numbers Identifying shapes Exercise 3 (VOR cancellation) Use only one card for this exercise While holding the card in sitting with your arms outstretched, turn your body, arm, and head at the same time left and right at your own pace Do this for one minute each while you are: Identifying numbers Identifying shapes  3/11/20204 - VOR x 1 Habituation Exercises 3x daily - Sit/stands w/ 4.5lb weight at home 2 x 5 daily.  ASSESSMENT:  CLINICAL IMPRESSION: Patient able to ambulate with good mechanics without loss of balance with quad cane and able to progress to [facility] today. Continued with LE strengthening which is tolerated well. Patient with intermittent unsteadiness in RLE due to c/o calf pain/ hip pain and nerve pain. Symptoms may be due to hip weakness/ lumbar pathology, will assess further next few sessions and treat as appropriate as this may contribute to balance deficits.  Patient will continue to benefit from physical therapy in order to improve function and reduce impairment.   PROGRESS NOTE 07/10/22: Patient demonstrated continued improvements in function as indicated by positive significant changes in , 5TSTS, and strength. However, patient still presents with deficits in vestibular function which greatly affects her standing balance. Patient still presents as a falls risk based on the Tinetti score. With this, skilled PT is still required to address the impairments and functional limitations listed below.  Interventions today were geared towards gaze stabilization incorporated with her standing balance. Patient tolerated gaze stabilization  activities with mild dizziness and unsteadiness on the saccades, VOR 1 and cancellation exercises. Demonstrated appropriate levels of fatigue. Rest periods provided. Provided slight  amount of cueing to ensure correct execution of activity with good carry-over.    OBJECTIVE IMPAIRMENTS: Abnormal gait, decreased balance, decreased coordination, decreased endurance, difficulty walking, decreased strength, impaired flexibility, and pain.   ACTIVITY LIMITATIONS: carrying, lifting, bending, standing, squatting, stairs, transfers, continence, bathing, toileting, dressing, self feeding, hygiene/grooming, and locomotion level  PARTICIPATION LIMITATIONS: meal prep, cleaning, laundry, driving, shopping, community activity, and yard work  PERSONAL FACTORS: Fitness level, Bladder implant (will have surgery 07/26/2022), hx of B THR, lobectomy are also affecting patient's functional outcome.   REHAB POTENTIAL: Fair    CLINICAL DECISION MAKING: Evolving/moderate complexity  EVALUATION COMPLEXITY: Moderate   GOALS: Goals reviewed with patient? Yes  SHORT TERM GOALS: Target date: 07/13/2022  Pt will demonstrate indep in HEP to facilitate carry-over of skilled services and improve functional outcomes Goal status: MET  LONG TERM GOALS: Target date: 08/31/2022  Pt will be report little to no dizziness on VOR 1 and head impulse test to facilitate safety on ambulation Baseline: with dizziness Goal status: INITIAL/NEW   Pt will decrease 5TSTS by at least 5 seconds in order to demonstrate clinically significant improvement in LE strength  Baseline: 18.88 sec Goal status: REVISED  3.  Patient will demonstrate increase in Tinetti Score by 8 points in order to demonstrate clinically significant improvement in balance and decreased risk for falls  Baseline: 9 Goal status: IN PROGRESS  4.  Pt will increase by at least 40 ft in order to demonstrate clinically significant improvement in community  ambulation Baseline: 304 ft; 343 ft with rollator 4/29 Goal status: REVISED  5.  Pt will demonstrate increase in LE strength to 4 to facilitate ease and safety in ambulation Baseline: 2+ Goal status: MET  6.  Pt will demonstrate improved LE coordination as manifested by performing 3/4 correct trials in heel-to-shin to facilitate ease in ambulation Baseline: impaired Goal status: INITIAL  7.  Pt will be able to walk x 224 ft in 2 minutes with a quad cane Baseline: walks with a rollator Goal status: REVISED 08/06/22   PLAN:  PT FREQUENCY: 3x/week  PT DURATION: 4 weeks  PLANNED INTERVENTIONS: Therapeutic exercises, Therapeutic activity, Neuromuscular re-education, Balance training, Gait training, Patient/Family education, Self Care, and Stair training  PLAN FOR NEXT SESSION: Continue 2 x a week for 3 weeks; may progress as tolerated with emphasis on balance, LE strengthening, flexibility, and vestibular function.   1:51 PM, 08/07/22 Wyman Songster PT, DPT Physical Therapist at [facility]

## 2022-08-08 ENCOUNTER — Ambulatory Visit: Payer: Medicare Other | Admitting: Urology

## 2022-08-09 ENCOUNTER — Ambulatory Visit (HOSPITAL_COMMUNITY): Payer: Medicare Other | Attending: Neurology | Admitting: Physical Therapy

## 2022-08-09 ENCOUNTER — Encounter (HOSPITAL_COMMUNITY): Payer: Self-pay | Admitting: Physical Therapy

## 2022-08-09 DIAGNOSIS — R262 Difficulty in walking, not elsewhere classified: Secondary | ICD-10-CM | POA: Diagnosis present

## 2022-08-09 DIAGNOSIS — M25551 Pain in right hip: Secondary | ICD-10-CM | POA: Diagnosis present

## 2022-08-09 NOTE — Therapy (Signed)
OUTPATIENT PHYSICAL THERAPY LOWER EXTREMITY TREATMENT NOTE/ PROGRESS NOTE      Patient Name: [name] MRN: [id number] DOB:1966-01-15, 57 y.o., female Today's Date: 08/09/2022  END OF SESSION:  PT End of Session - 08/09/22 1347     Visit Number 22    Number of Visits 28    Date for PT Re-Evaluation 08/31/22    Authorization Type BCBS Medicare, Medicaid of [place]    Progress Note Due on Visit 28    PT Start Time 1347    PT Stop Time 1425    PT Time Calculation (min) 38 min    Equipment Utilized During Treatment Gait belt;Oxygen    Activity Tolerance Patient tolerated treatment well    Behavior During Therapy WFL for tasks assessed/performed             Past Surgical History:  Procedure Laterality Date   ABDOMINAL HYSTERECTOMY     with bladder suspension   CERVICAL FUSION     CHOLECYSTECTOMY     COLONOSCOPY N/A 07/30/2013   Procedure: COLONOSCOPY;  Surgeon: Malissa Hippo, MD;  [facility]: [facility];  Service: Endoscopy;  Laterality: N/A;  930   EXAM UNDER ANESTHESIA WITH MANIPULATION OF SHOULDER Right 10/11/2016   Procedure: EXAM UNDER ANESTHESIA WITH MANIPULATION OF SHOULDER;  Surgeon: Vickki Hearing, MD;  Location: [facility];  Service: Orthopedics;  Laterality: Right;   HERNIA REPAIR     INCISIONAL HERNIA REPAIR N/A 07/30/2016   Procedure: HERNIA REPAIR INCISIONAL WITH MESH;  Surgeon: [name], MD;  Location: [facility];  Service: General;  Laterality: N/A;   INTERSTIM IMPLANT PLACEMENT  02/02/2021   Procedure: Leane Platt IMPLANT FIRST STAGE LEFT SACRUM ;  Surgeon: Malen Gauze, MD;  Location: [facility];  Service: Urology;;   Leane Platt IMPLANT PLACEMENT N/A 02/23/2021   Procedure: Leane Platt IMPLANT SECOND STAGE;  Surgeon: Malen Gauze, MD;  Location: [facility];  Service: Urology;  Laterality: N/A;  Rep coming, time needs to stay at 2:00   JOINT REPLACEMENT     LOBECTOMY     right lung    POSTERIOR CERVICAL LAMINECTOMY Right 04/05/2017   Procedure:  Right C6-7 Posterior cervical laminectomy;  Surgeon: Donalee Citrin, MD;  Location: [facility] OR;  Service: Neurosurgery;  Laterality: Right;  Right C6-7 Posterior cervical laminectomy   SHOULDER ARTHROSCOPY WITH ROTATOR CUFF REPAIR Right 10/11/2016   Procedure: SHOULDER ARTHROSCOPY;  Surgeon: Vickki Hearing, MD;  Location: [facility];  Service: Orthopedics;  Laterality: Right;   TOTAL HIP ARTHROPLASTY Right 04/15/2020   Procedure: RIGHT TOTAL HIP ARTHROPLASTY ANTERIOR APPROACH;  Surgeon: Kathryne Hitch, MD;  Location: [facility];  Service: Orthopedics;  Laterality: Right;   TOTAL HIP ARTHROPLASTY Left 07/22/2020   Procedure: LEFT  HIP ARTHROPLASTY ANTERIOR APPROACH;  Surgeon: Kathryne Hitch, MD;  Location: [facility];  Service: Orthopedics;  Laterality: Left;  RNFA   TUBAL LIGATION     Patient Active Problem List   Diagnosis Date Noted   Severe persistent asthma without complication 06/06/2022   Gait abnormality 05/28/2022   Adrenal insufficiency (HCC) 03/30/2022   Endocrine disorder, unspecified 07/04/2021   Prediabetes 07/04/2021   Hypothyroidism 07/04/2021   Former smoker 06/29/2021   Seasonal and perennial allergic rhinitis 06/19/2021   Oxygen dependent 06/19/2021   Steroid dependent (HCC) 06/19/2021   Immunosuppressed status (HCC) 06/19/2021   Radiculopathy, lumbar region 12/08/2020   Status post left hip replacement 07/22/2020   Status post right hip replacement 05/02/2020   Status post total  replacement of right hip 04/15/2020   Avascular necrosis of bone of left hip (HCC) 02/10/2020   Avascular necrosis of bone of right hip (HCC) 02/10/2020   Allergic rhinitis 05/21/2019   Asthma-COPD overlap syndrome 04/30/2019   Chronic respiratory failure with hypoxia (HCC) 04/30/2019   Spinal stenosis of cervical region 04/05/2017   Partial tear of right subscapularis tendon    Incisional hernia, without obstruction or gangrene    Urinary frequency 03/21/2012   Migraine equivalent  syndrome 03/21/2012   Insomnia 03/21/2012    PCP: Benita Stabile, MD  REFERRING PROVIDER: Levert Feinstein, MD  REFERRING DIAG: R26.9 (ICD-10-CM) - Gait abnormality  THERAPY DIAG:  Difficulty in walking, not elsewhere classified  Pain in right hip  Rationale for Evaluation and Treatment: Rehabilitation  ONSET DATE: April 12, 2022  SUBJECTIVE:   SUBJECTIVE STATEMENT: Patient states went to PCP and upped her thyroid meds. Told to take multivitamin for possible vitamin deficiency. Goes back to ENT for testing 5/13. Was only a little tired after last time.   PROGRESS NOTE 07/10/22: Patient states that she went to her neurologist and was diagnosed with vestibular neuritis. Patient says she will be referred to a neurologist. Patient states that she's still dizzy ("wavy" sensation) but not as bad as she used to be. Patient states that her balance is still off but has improved a little since she started PT. Denies any recent falls. Patient thinks she has improved around 25%. With this, patient wishes to continue with PT.   PERTINENT HISTORY: Bladder implant (will have surgery 07/26/2022), hx of B THR, lobectomy PAIN:  Are you having pain? No  PRECAUTIONS: Fall and Other: uses portable O2  WEIGHT BEARING RESTRICTIONS: No  FALLS:  Has patient fallen in last 6 months? Yes. Number of falls 7  LIVING ENVIRONMENT: Lives with: lives with their spouse Lives in: House/apartment Stairs: Yes: External: 1 steps; none Has following equipment at home: Single point cane, Walker - 2 wheeled, Environmental consultant - 4 wheeled, shower chair, bed side commode, and oxygen  OCCUPATION: on disability  PLOF: Independent with household mobility with device and Requires assistive device for independence  PATIENT GOALS: "to be able to walk straight by myself"  NEXT MD VISIT: Aug 30, 2022  OBJECTIVE:   DIAGNOSTIC FINDINGS:  MRI HEAD WITHOUT CONTRAST 06/08/22   TECHNIQUE: Multiplanar, multiecho pulse sequences of the  brain and surrounding structures were obtained without intravenous contrast.   COMPARISON:  Head CT 05/22/2022. Brain MRI 05/22/2011.   FINDINGS: Brain:   No age advanced or lobar predominant parenchymal atrophy.   Multifocal T2 FLAIR hyperintense signal abnormality within the cerebral white matter, overall mild but greater than expected for age.   There is no acute infarct.   No evidence of an intracranial mass.   No chronic intracranial blood products.   No extra-axial fluid collection.   No midline shift.   Vascular: Maintained flow voids within the proximal large arterial vessels.   Skull and upper cervical spine: No focal suspicious calvarial marrow lesion. Cervical spine findings separately reported on same day cervical spine MRI.   Sinuses/Orbits: No mass or acute finding within the imaged orbits. Prior bilateral ocular lens replacement. Trace mucosal thickening within left ethmoid air cells.   Other: Small-volume fluid within the bilateral mastoid air cells.   IMPRESSION: 1.  No evidence of an acute intracranial abnormality. 2. Multifocal T2 FLAIR hyperintense signal abnormality within the cerebral white matter, overall mild but greater than expected for age. These  signal changes have slightly progressed from the prior brain MRI of 05/22/2011. Findings are nonspecific and differential considerations include chronic small vessel ischemic disease, sequelae of chronic migraine headaches, sequelae of a prior infectious/inflammatory process or sequelae of a demyelinating process (such as multiple sclerosis), among others. No lesions specifically suggest demyelinating disease. 3. Small-volume fluid within the bilateral mastoid air cells.  MRI CERVICAL SPINE WITHOUT CONTRAST 06/08/22   TECHNIQUE: Multiplanar, multisequence MR imaging of the cervical spine was performed. No intravenous contrast was administered.   COMPARISON:  Radiographs of the cervical spine  08/06/2017. Cervical spine MRI 02/24/2017.   FINDINGS: Alignment: Straightening of the expected cervical lordosis. 3 mm C7-T1 grade 1 anterolisthesis.   Vertebrae: Vertebral body height is maintained. Susceptibility artifact arising from ACDF hardware at the C4-C7 levels. No appreciable significant marrow edema or focal suspicious osseous lesion.   Cord: No signal abnormality identified within the cervical spinal cord.   Posterior Fossa, vertebral arteries, paraspinal tissues: Posterior fossa assessed on same-day brain MRI. Flow voids preserved within the imaged cervical vertebral arteries. No paraspinal mass or collection.   Disc levels:   Unless otherwise stated, the level by level findings below have not significantly changed from the prior MRI of 03/06/2017.   Disc degeneration at the non-operative levels, greatest at C3-C4 and C7-T1 (progressed at these levels).   C2-C3: Slight disc bulge. No significant spinal canal or foraminal stenosis.   C3-C4: Disc bulge. New superimposed small central disc protrusion (at site of posterior annular fissure). Uncovertebral hypertrophy on the right, new from the prior MRI. Facet arthrosis. Mild relative spinal canal narrowing (without spinal cord mass effect), new from the prior MRI. Moderate right neural foraminal narrowing, also new from the prior MRI.   C4-C5: Prior ACDF. No significant spinal canal or foraminal stenosis.   C5-C6: Prior ACDF. Uncovertebral hypertrophy on the right. No significant spinal canal stenosis. Mild right neural foraminal narrowing.   C6-C7: Prior ACDF. Uncovertebral vertebrae on the right. No significant spinal canal stenosis. Moderate right neural foraminal narrowing.   C7-T1: 3 mm grade 1 anterolisthesis. Mild disc uncovering. Facet arthrosis. No significant spinal canal stenosis. Mild-to-moderate bilateral neural foraminal narrowing.   IMPRESSION: Prior C4-C7 ACDF. No significant spinal  canal stenosis at these levels. As before, uncovertebral hypertrophy results in neural foraminal narrowing on the right at C5-C6 (mild), and on the right at C6-C7 (moderate).   Cervical spondylosis at the remaining levels, as outlined and with findings most notably as follows.   At C3-C4, there is a disc bulge. New superimposed small central disc protrusion (at site of posterior annular fissure). Uncovertebral hypertrophy on the right, new from the prior MRI. Facet arthrosis. Mild spinal canal narrowing, and moderate right neural foraminal narrowing, new from the prior MRI.   At C7-T1, there is 3 mm grade 1 anterolisthesis. Mild disc uncovering. Facet arthrosis. No significant spinal canal stenosis. Mild-to-moderate bilateral neural foraminal narrowing. Findings at this level have not significantly changed.  COGNITION: Overall cognitive status: Within functional limits for tasks assessed     SENSATION: Not tested  MUSCLE LENGTH: (not tested on 07/10/22) Mild restriction on B hamstrings Moderate restriction on B gastrocsoleus  POSTURE: rounded shoulders and forward head   LOWER EXTREMITY MMT  MMT Right eval Left eval Right 07/10/22 Left 07/10/22 Right 08/06/22 Left 08/06/22  Hip flexion 4 4 4 4  4+ 4+  Hip extension 2+ (bridging) 2+  (bridging) 3+  (bridging) 3+ (bridging) 4 4  Hip abduction 4 4 4  4  4+ 4+  Hip adduction        Hip internal rotation        Hip external rotation        Knee flexion 4- 4- 4 4 4  4+  Knee extension 4- 3+ 4 4 5 5   Ankle dorsiflexion 3+ 4- 4- 4- 4 4+  Ankle plantarflexion 3+ 3+ 3+ 3+    Ankle inversion        Ankle eversion         (Blank rows = not tested)  LOWER EXTREMITY ROM  Active ROM Right eval Left eval  Hip flexion [facility]  Hip extension    Hip abduction [facility]  Hip adduction [facility]  Hip internal rotation    Hip external rotation    Knee flexion [facility] WFL  Knee extension [facility]  Ankle dorsiflexion [facility] WFL  Ankle  plantarflexion [facility] WFL  Ankle inversion    Ankle eversion     (Blank rows = not tested)  FUNCTIONAL TESTS:  5 times sit to stand: 18.88 sec (little assist from rollator) from 26.97 sec (has to hold on to rollator) 2 minute walk test: 304 ft (with rollator) from 224 ft (with a rollator) Tinetti POMA : 14 from 9 (balance + gait)  GAIT: Distance walked: 304 ft Assistive device utilized:  rollator Level of assistance: Modified independence Comments: foot clearance on B more evident, normal step length on B  VESTIBULAR ASSESSMENT  06/21/22 07/10/22  Head impulse test positive with corrective saccades, dizziness reported positive with corrective saccades, dizziness reported  VOR 1 corrective saccades, dizziness reported corrective saccades, dizziness reported  Optokinetic reflex (R/L)  impaired, dizziness reported  impaired, dizziness reported    TODAY'S TREATMENT:                                                                                                                              DATE:  08/09/22 Gait with SPC 675 feet Standing 3 way hip GTB at knees 1 x 5 bilateral  Palof press RB 1 x 10 bilateral   08/07/22 Gait with SBQC 226 feet, SPC 450 feet Sit to stand holding yellow med ball 2 x 10  Lateral stepping 6 x 10 feet Seated piriformis stretch 2 x 20 second holds bilateral    08/06/2022 Progress note 5 times sit to stand 18.32 sec no UE assist; CGA for safety 2 min walk test with rollator 343 ft; 131 ft with QC and CGA MMT's see above  08/03/2022 Sit to stand with red ball 2 x 10 Foam balance with UE movement with dowel bilateral shoulder flexion and then trunk rotation x 8 each Foam marching 2 x 10 Tandem stance 2 x 30" each Gait training with SBQC x 225 ft; step over low hurdles and around cones   08/01/2022  -Tandem stance 2 x 1 min bilaterally; intermittently UE touching -Modified single leg balnce standing with blue balance beam with  UE 1lb passing 3 x 12  repetitions. Use of bodyblade for coordination skill practice. -8in stepups 6in box and 2in blue foam pad 2 x 8-10. Cues for intermittent HHA on parallel bars.  -227ft SBQC ambulation; CGA--->supervision level   07/30/22 Sit to stand x 10 no UE assist Sit to stand holding green med ball 1 x 10  Sit to stand holding red med ball x 10 Standing: 8" step ups x 10 each with intermittent 2 hand assist Tandem stance on foam 2 x 10" each Heel/toe raises on incline with no UE assist x 10 Small base of support x 30" no UE assist  Ambulation with SBQC x 230 ft with CGA   07/27/22 Sit to stand 2 x 10 no UE assist Sitting: Diaphragmatic breathing review education and performance  Standing: Heel taps x 10 each no UE assist 4" step ups x 5 each no UE assist  Gait training with large base quad cane with CGA x 225 ft   07/25/22 Gait with large base quad cane 300 feet with CGA STS 1 x 10 Anterior stepping with bilateral overhead reach 1 x 15 bilateral       PATIENT EDUCATION:  Education details: Updated HEP. Instructed patient to perform standing exercises on with a stable support (e.g. kitchen counter) and have her husband next to her. Educated on possible anxiety related/multiple systems contributing to symptoms Person educated: Patient Education method: Explanation Education comprehension: verbalized understanding  HOME EXERCISE PROGRAM: Access Code: [email or web address] URL: [email or web address]  08/09/22 - Standing Anti-Rotation Press with Anchored Resistance  - 1 x daily - 7 x weekly - 2 sets - 10 reps  07/25/22 - Forward Step with Same Side Arm Raise  - 1 x daily - 7 x weekly - 2 sets - 10 reps  07/23/22- Supine Diaphragmatic Breathing  - 1 x daily - 7 x weekly - 10 reps - Seated Diaphragmatic Breathing  - 1 x daily - 7 x weekly - 10 reps - Step Up  - 1-2 x daily - 7 x weekly - 2 sets - 10 reps  07/17/22 - Sit to Stand with Arms Crossed  - 1 x daily - 7 x weekly - 1-2 sets - 10  reps  07/13/22: - Standing 3-Way Leg Reach with Resistance at Ankles and Counter Support  - 1 x daily - 7 x weekly - 2 sets - 10 reps  07/02/22 hip abduction, small bos standing and regular standing head turns and nods  06/28/2022 - Mini Squat with Counter Support  - 1-2 x daily - 5-7 x weekly - 2 sets - 10 reps - 3 hold - Standing Hip Extension with Counter Support  - 1-2 x daily - 5-7 x weekly - 2 sets - 10 reps  Date: 06/27/2022 Prepared by: Krystal Clark  Exercises - Seated Calf Stretch with Strap  - 1-2 x daily - 5-7 x weekly - 3 reps - 30 hold - Standing Tandem Balance with Counter Support  - 1-2 x daily - 5-7 x weekly - 30 hold - Heel Toe Raises with Counter Support  - 1-2 x daily - 5-7 x weekly - 2 sets - 10 reps  06/21/2022 GAZE STABILIZATION EXERCISES (2-3x/day, 5-7 days/week, seated) These exercises are designed to improve your eyes' ability to track objects without getting dizzy. When performing these exercises, make sure you are facing a blank wall (like a white wall) to avoid distraction. You can wear your glasses/contact lenses if you must.  Before anything, warm-up your neck muscles by bending it forward and back 10 times, side-to-side 10 times, and slowly turn your head left and right 10 time Exercise 1 (Horizontal Saccades) Use two cards for this exercise While holding the cards on each hand side by side in sitting with arms outstretched, look at the first item on the other card then look at the next item on the second card then alternate your gaze sequentially. Do NOT move your cards nor the your head. Do the following for one minute each Identifying numbers Identifying shapes Exercise 2 (Paradigm x1) Use only one card for this exercise While holding the card in sitting with your arm outstretched, turn your head left and right at your own pace while keeping the card stationary.  Do this for one minute each while you are: Identifying numbers Identifying  shapes Exercise 3 (VOR cancellation) Use only one card for this exercise While holding the card in sitting with your arms outstretched, turn your body, arm, and head at the same time left and right at your own pace Do this for one minute each while you are: Identifying numbers Identifying shapes  3/11/20204 - VOR x 1 Habituation Exercises 3x daily - Sit/stands w/ 4.5lb weight at home 2 x 5 daily.  ASSESSMENT:  CLINICAL IMPRESSION: Patient able to ambulate with [facility] for increased distance with 1 bout of unsteadiness reaching for counter for support although did not even stumble. Continued with strength and balance which is tolerated well. Patient will continue to benefit from physical therapy in order to improve function and reduce impairment.   PROGRESS NOTE 07/10/22: Patient demonstrated continued improvements in function as indicated by positive significant changes in , 5TSTS, and strength. However, patient still presents with deficits in vestibular function which greatly affects her standing balance. Patient still presents as a falls risk based on the Tinetti score. With this, skilled PT is still required to address the impairments and functional limitations listed below.  Interventions today were geared towards gaze stabilization incorporated with her standing balance. Patient tolerated gaze stabilization activities with mild dizziness and unsteadiness on the saccades, VOR 1 and cancellation exercises. Demonstrated appropriate levels of fatigue. Rest periods provided. Provided slight amount of cueing to ensure correct execution of activity with good carry-over.    OBJECTIVE IMPAIRMENTS: Abnormal gait, decreased balance, decreased coordination, decreased endurance, difficulty walking, decreased strength, impaired flexibility, and pain.   ACTIVITY LIMITATIONS: carrying, lifting, bending, standing, squatting, stairs, transfers, continence, bathing, toileting, dressing, self feeding,  hygiene/grooming, and locomotion level  PARTICIPATION LIMITATIONS: meal prep, cleaning, laundry, driving, shopping, community activity, and yard work  PERSONAL FACTORS: Fitness level, Bladder implant (will have surgery 07/26/2022), hx of B THR, lobectomy are also affecting patient's functional outcome.   REHAB POTENTIAL: Fair    CLINICAL DECISION MAKING: Evolving/moderate complexity  EVALUATION COMPLEXITY: Moderate   GOALS: Goals reviewed with patient? Yes  SHORT TERM GOALS: Target date: 07/13/2022  Pt will demonstrate indep in HEP to facilitate carry-over of skilled services and improve functional outcomes Goal status: MET  LONG TERM GOALS: Target date: 08/31/2022  Pt will be report little to no dizziness on VOR 1 and head impulse test to facilitate safety on ambulation Baseline: with dizziness Goal status: INITIAL/NEW   Pt will decrease 5TSTS by at least 5 seconds in order to demonstrate clinically significant improvement in LE strength  Baseline: 18.88 sec Goal status: REVISED  3.  Patient will demonstrate increase in Tinetti Score by 8 points in order to  demonstrate clinically significant improvement in balance and decreased risk for falls  Baseline: 9 Goal status: IN PROGRESS  4.  Pt will increase by at least 40 ft in order to demonstrate clinically significant improvement in community ambulation Baseline: 304 ft; 343 ft with rollator 4/29 Goal status: REVISED  5.  Pt will demonstrate increase in LE strength to 4 to facilitate ease and safety in ambulation Baseline: 2+ Goal status: MET  6.  Pt will demonstrate improved LE coordination as manifested by performing 3/4 correct trials in heel-to-shin to facilitate ease in ambulation Baseline: impaired Goal status: INITIAL  7.  Pt will be able to walk x 224 ft in 2 minutes with a quad cane Baseline: walks with a rollator Goal status: REVISED 08/06/22   PLAN:  PT FREQUENCY: 3x/week  PT DURATION: 4  weeks  PLANNED INTERVENTIONS: Therapeutic exercises, Therapeutic activity, Neuromuscular re-education, Balance training, Gait training, Patient/Family education, Self Care, and Stair training  PLAN FOR NEXT SESSION: Continue 2 x a week for 3 weeks; may progress as tolerated with emphasis on balance, LE strengthening, flexibility, and vestibular function.   1:48 PM, 08/09/22 Wyman Songster PT, DPT Physical Therapist at [facility]

## 2022-08-15 ENCOUNTER — Ambulatory Visit (HOSPITAL_COMMUNITY): Payer: Medicare Other | Admitting: Physical Therapy

## 2022-08-15 ENCOUNTER — Encounter (HOSPITAL_COMMUNITY): Payer: Self-pay | Admitting: Physical Therapy

## 2022-08-15 DIAGNOSIS — R262 Difficulty in walking, not elsewhere classified: Secondary | ICD-10-CM | POA: Diagnosis not present

## 2022-08-15 DIAGNOSIS — M25551 Pain in right hip: Secondary | ICD-10-CM

## 2022-08-15 NOTE — Therapy (Signed)
OUTPATIENT PHYSICAL THERAPY LOWER EXTREMITY TREATMENT NOTE      Patient Name: [name] MRN: [id number] DOB:1965-09-05, 57 y.o., female Today's Date: 08/15/2022  END OF SESSION:  PT End of Session - 08/15/22 1348     Visit Number 23    Number of Visits 28    Date for PT Re-Evaluation 08/31/22    Authorization Type BCBS Medicare, Medicaid of [place]    Progress Note Due on Visit 28    PT Start Time 1348    PT Stop Time 1427    PT Time Calculation (min) 39 min    Equipment Utilized During Treatment Gait belt;Oxygen    Activity Tolerance Patient tolerated treatment well    Behavior During Therapy WFL for tasks assessed/performed             Past Surgical History:  Procedure Laterality Date   ABDOMINAL HYSTERECTOMY     with bladder suspension   CERVICAL FUSION     CHOLECYSTECTOMY     COLONOSCOPY N/A 07/30/2013   Procedure: COLONOSCOPY;  Surgeon: Malissa Hippo, MD;  Location: [facility];  Service: Endoscopy;  Laterality: N/A;  930   EXAM UNDER ANESTHESIA WITH MANIPULATION OF SHOULDER Right 10/11/2016   Procedure: EXAM UNDER ANESTHESIA WITH MANIPULATION OF SHOULDER;  Surgeon: Vickki Hearing, MD;  Location: [facility];  Service: Orthopedics;  Laterality: Right;   HERNIA REPAIR     INCISIONAL HERNIA REPAIR N/A 07/30/2016   Procedure: HERNIA REPAIR INCISIONAL WITH MESH;  Surgeon: [name], MD;  Location: [facility];  Service: General;  Laterality: N/A;   INTERSTIM IMPLANT PLACEMENT  02/02/2021   Procedure: Leane Platt IMPLANT FIRST STAGE LEFT SACRUM ;  Surgeon: Malen Gauze, MD;  Location: [facility];  Service: Urology;;   Leane Platt IMPLANT PLACEMENT N/A 02/23/2021   Procedure: Leane Platt IMPLANT SECOND STAGE;  Surgeon: Malen Gauze, MD;  Location: [facility];  Service: Urology;  Laterality: N/A;  Rep coming, time needs to stay at 2:00   JOINT REPLACEMENT     LOBECTOMY     right lung    POSTERIOR CERVICAL LAMINECTOMY Right 04/05/2017   Procedure: Right C6-7  Posterior cervical laminectomy;  Surgeon: Donalee Citrin, MD;  Location: [facility] OR;  Service: Neurosurgery;  Laterality: Right;  Right C6-7 Posterior cervical laminectomy   SHOULDER ARTHROSCOPY WITH ROTATOR CUFF REPAIR Right 10/11/2016   Procedure: SHOULDER ARTHROSCOPY;  Surgeon: Vickki Hearing, MD;  Location: [facility];  Service: Orthopedics;  Laterality: Right;   TOTAL HIP ARTHROPLASTY Right 04/15/2020   Procedure: RIGHT TOTAL HIP ARTHROPLASTY ANTERIOR APPROACH;  Surgeon: Kathryne Hitch, MD;  Location: [facility];  Service: Orthopedics;  Laterality: Right;   TOTAL HIP ARTHROPLASTY Left 07/22/2020   Procedure: LEFT  HIP ARTHROPLASTY ANTERIOR APPROACH;  Surgeon: Kathryne Hitch, MD;  Location: [facility];  Service: Orthopedics;  Laterality: Left;  RNFA   TUBAL LIGATION     Patient Active Problem List   Diagnosis Date Noted   Severe persistent asthma without complication 06/06/2022   Gait abnormality 05/28/2022   Adrenal insufficiency (HCC) 03/30/2022   Endocrine disorder, unspecified 07/04/2021   Prediabetes 07/04/2021   Hypothyroidism 07/04/2021   Former smoker 06/29/2021   Seasonal and perennial allergic rhinitis 06/19/2021   Oxygen dependent 06/19/2021   Steroid dependent (HCC) 06/19/2021   Immunosuppressed status (HCC) 06/19/2021   Radiculopathy, lumbar region 12/08/2020   Status post left hip replacement 07/22/2020   Status post right hip replacement 05/02/2020   Status post total replacement of  right hip 04/15/2020   Avascular necrosis of bone of left hip (HCC) 02/10/2020   Avascular necrosis of bone of right hip (HCC) 02/10/2020   Allergic rhinitis 05/21/2019   Asthma-COPD overlap syndrome 04/30/2019   Chronic respiratory failure with hypoxia (HCC) 04/30/2019   Spinal stenosis of cervical region 04/05/2017   Partial tear of right subscapularis tendon    Incisional hernia, without obstruction or gangrene    Urinary frequency 03/21/2012   Migraine equivalent syndrome  03/21/2012   Insomnia 03/21/2012    PCP: Benita Stabile, MD  REFERRING PROVIDER: Levert Feinstein, MD  REFERRING DIAG: R26.9 (ICD-10-CM) - Gait abnormality  THERAPY DIAG:  Difficulty in walking, not elsewhere classified  Pain in right hip  Rationale for Evaluation and Treatment: Rehabilitation  ONSET DATE: April 12, 2022  SUBJECTIVE:   SUBJECTIVE STATEMENT: Patient states  been using SPC but doesn't feel safe so she has been using the quad cane. After 2 days her back was really bothering her. Switched back to rollator and then back felt better.   PROGRESS NOTE 07/10/22: Patient states that she went to her neurologist and was diagnosed with vestibular neuritis. Patient says she will be referred to a neurologist. Patient states that she's still dizzy ("wavy" sensation) but not as bad as she used to be. Patient states that her balance is still off but has improved a little since she started PT. Denies any recent falls. Patient thinks she has improved around 25%. With this, patient wishes to continue with PT.   PERTINENT HISTORY: Bladder implant (will have surgery 07/26/2022), hx of B THR, lobectomy PAIN:  Are you having pain? No  PRECAUTIONS: Fall and Other: uses portable O2  WEIGHT BEARING RESTRICTIONS: No  FALLS:  Has patient fallen in last 6 months? Yes. Number of falls 7  LIVING ENVIRONMENT: Lives with: lives with their spouse Lives in: House/apartment Stairs: Yes: External: 1 steps; none Has following equipment at home: Single point cane, Walker - 2 wheeled, Environmental consultant - 4 wheeled, shower chair, bed side commode, and oxygen  OCCUPATION: on disability  PLOF: Independent with household mobility with device and Requires assistive device for independence  PATIENT GOALS: "to be able to walk straight by myself"  NEXT MD VISIT: Aug 30, 2022  OBJECTIVE:   DIAGNOSTIC FINDINGS:  MRI HEAD WITHOUT CONTRAST 06/08/22   TECHNIQUE: Multiplanar, multiecho pulse sequences of the brain and  surrounding structures were obtained without intravenous contrast.   COMPARISON:  Head CT 05/22/2022. Brain MRI 05/22/2011.   FINDINGS: Brain:   No age advanced or lobar predominant parenchymal atrophy.   Multifocal T2 FLAIR hyperintense signal abnormality within the cerebral white matter, overall mild but greater than expected for age.   There is no acute infarct.   No evidence of an intracranial mass.   No chronic intracranial blood products.   No extra-axial fluid collection.   No midline shift.   Vascular: Maintained flow voids within the proximal large arterial vessels.   Skull and upper cervical spine: No focal suspicious calvarial marrow lesion. Cervical spine findings separately reported on same day cervical spine MRI.   Sinuses/Orbits: No mass or acute finding within the imaged orbits. Prior bilateral ocular lens replacement. Trace mucosal thickening within left ethmoid air cells.   Other: Small-volume fluid within the bilateral mastoid air cells.   IMPRESSION: 1.  No evidence of an acute intracranial abnormality. 2. Multifocal T2 FLAIR hyperintense signal abnormality within the cerebral white matter, overall mild but greater than expected for age.  These signal changes have slightly progressed from the prior brain MRI of 05/22/2011. Findings are nonspecific and differential considerations include chronic small vessel ischemic disease, sequelae of chronic migraine headaches, sequelae of a prior infectious/inflammatory process or sequelae of a demyelinating process (such as multiple sclerosis), among others. No lesions specifically suggest demyelinating disease. 3. Small-volume fluid within the bilateral mastoid air cells.  MRI CERVICAL SPINE WITHOUT CONTRAST 06/08/22   TECHNIQUE: Multiplanar, multisequence MR imaging of the cervical spine was performed. No intravenous contrast was administered.   COMPARISON:  Radiographs of the cervical spine 08/06/2017.  Cervical spine MRI 02/24/2017.   FINDINGS: Alignment: Straightening of the expected cervical lordosis. 3 mm C7-T1 grade 1 anterolisthesis.   Vertebrae: Vertebral body height is maintained. Susceptibility artifact arising from ACDF hardware at the C4-C7 levels. No appreciable significant marrow edema or focal suspicious osseous lesion.   Cord: No signal abnormality identified within the cervical spinal cord.   Posterior Fossa, vertebral arteries, paraspinal tissues: Posterior fossa assessed on same-day brain MRI. Flow voids preserved within the imaged cervical vertebral arteries. No paraspinal mass or collection.   Disc levels:   Unless otherwise stated, the level by level findings below have not significantly changed from the prior MRI of 03/06/2017.   Disc degeneration at the non-operative levels, greatest at C3-C4 and C7-T1 (progressed at these levels).   C2-C3: Slight disc bulge. No significant spinal canal or foraminal stenosis.   C3-C4: Disc bulge. New superimposed small central disc protrusion (at site of posterior annular fissure). Uncovertebral hypertrophy on the right, new from the prior MRI. Facet arthrosis. Mild relative spinal canal narrowing (without spinal cord mass effect), new from the prior MRI. Moderate right neural foraminal narrowing, also new from the prior MRI.   C4-C5: Prior ACDF. No significant spinal canal or foraminal stenosis.   C5-C6: Prior ACDF. Uncovertebral hypertrophy on the right. No significant spinal canal stenosis. Mild right neural foraminal narrowing.   C6-C7: Prior ACDF. Uncovertebral vertebrae on the right. No significant spinal canal stenosis. Moderate right neural foraminal narrowing.   C7-T1: 3 mm grade 1 anterolisthesis. Mild disc uncovering. Facet arthrosis. No significant spinal canal stenosis. Mild-to-moderate bilateral neural foraminal narrowing.   IMPRESSION: Prior C4-C7 ACDF. No significant spinal canal stenosis  at these levels. As before, uncovertebral hypertrophy results in neural foraminal narrowing on the right at C5-C6 (mild), and on the right at C6-C7 (moderate).   Cervical spondylosis at the remaining levels, as outlined and with findings most notably as follows.   At C3-C4, there is a disc bulge. New superimposed small central disc protrusion (at site of posterior annular fissure). Uncovertebral hypertrophy on the right, new from the prior MRI. Facet arthrosis. Mild spinal canal narrowing, and moderate right neural foraminal narrowing, new from the prior MRI.   At C7-T1, there is 3 mm grade 1 anterolisthesis. Mild disc uncovering. Facet arthrosis. No significant spinal canal stenosis. Mild-to-moderate bilateral neural foraminal narrowing. Findings at this level have not significantly changed.  COGNITION: Overall cognitive status: Within functional limits for tasks assessed     SENSATION: Not tested  MUSCLE LENGTH: (not tested on 07/10/22) Mild restriction on B hamstrings Moderate restriction on B gastrocsoleus  POSTURE: rounded shoulders and forward head   LOWER EXTREMITY MMT  MMT Right eval Left eval Right 07/10/22 Left 07/10/22 Right 08/06/22 Left 08/06/22  Hip flexion 4 4 4 4  4+ 4+  Hip extension 2+ (bridging) 2+  (bridging) 3+  (bridging) 3+ (bridging) 4 4  Hip abduction 4 4 4  4 4+ 4+  Hip adduction        Hip internal rotation        Hip external rotation        Knee flexion 4- 4- 4 4 4  4+  Knee extension 4- 3+ 4 4 5 5   Ankle dorsiflexion 3+ 4- 4- 4- 4 4+  Ankle plantarflexion 3+ 3+ 3+ 3+    Ankle inversion        Ankle eversion         (Blank rows = not tested)  LOWER EXTREMITY ROM  Active ROM Right eval Left eval  Hip flexion [facility]  Hip extension    Hip abduction [facility]  Hip adduction [facility]  Hip internal rotation    Hip external rotation    Knee flexion [facility] WFL  Knee extension [facility]  Ankle dorsiflexion [facility] WFL  Ankle plantarflexion [facility] WFL   Ankle inversion    Ankle eversion     (Blank rows = not tested)  FUNCTIONAL TESTS:  5 times sit to stand: 18.88 sec (little assist from rollator) from 26.97 sec (has to hold on to rollator) 2 minute walk test: 304 ft (with rollator) from 224 ft (with a rollator) Tinetti POMA : 14 from 9 (balance + gait)  GAIT: Distance walked: 304 ft Assistive device utilized:  rollator Level of assistance: Modified independence Comments: foot clearance on B more evident, normal step length on B  VESTIBULAR ASSESSMENT  06/21/22 07/10/22  Head impulse test positive with corrective saccades, dizziness reported positive with corrective saccades, dizziness reported  VOR 1 corrective saccades, dizziness reported corrective saccades, dizziness reported  Optokinetic reflex (R/L)  impaired, dizziness reported  impaired, dizziness reported    TODAY'S TREATMENT:                                                                                                                              DATE:  08/15/22 Gait with SPC 450 feet Lateral step down 6 inch 2 x 10 bilateral  Lateral lunge 2 x 10 bilateral   08/09/22 Gait with SPC 675 feet Standing 3 way hip GTB at knees 1 x 5 bilateral  Palof press RB 1 x 10 bilateral   08/07/22 Gait with SBQC 226 feet, SPC 450 feet Sit to stand holding yellow med ball 2 x 10  Lateral stepping 6 x 10 feet Seated piriformis stretch 2 x 20 second holds bilateral    08/06/2022 Progress note 5 times sit to stand 18.32 sec no UE assist; CGA for safety 2 min walk test with rollator 343 ft; 131 ft with QC and CGA MMT's see above  08/03/2022 Sit to stand with red ball 2 x 10 Foam balance with UE movement with dowel bilateral shoulder flexion and then trunk rotation x 8 each Foam marching 2 x 10 Tandem stance 2 x 30" each Gait training with SBQC x 225 ft; step over low hurdles and around  cones   08/01/2022  -Tandem stance 2 x 1 min bilaterally; intermittently UE touching -Modified  single leg balnce standing with blue balance beam with UE 1lb passing 3 x 12 repetitions. Use of bodyblade for coordination skill practice. -8in stepups 6in box and 2in blue foam pad 2 x 8-10. Cues for intermittent HHA on parallel bars.  -263ft SBQC ambulation; CGA--->supervision level   07/30/22 Sit to stand x 10 no UE assist Sit to stand holding green med ball 1 x 10  Sit to stand holding red med ball x 10 Standing: 8" step ups x 10 each with intermittent 2 hand assist Tandem stance on foam 2 x 10" each Heel/toe raises on incline with no UE assist x 10 Small base of support x 30" no UE assist  Ambulation with SBQC x 230 ft with CGA   07/27/22 Sit to stand 2 x 10 no UE assist Sitting: Diaphragmatic breathing review education and performance  Standing: Heel taps x 10 each no UE assist 4" step ups x 5 each no UE assist  Gait training with large base quad cane with CGA x 225 ft   07/25/22 Gait with large base quad cane 300 feet with CGA STS 1 x 10 Anterior stepping with bilateral overhead reach 1 x 15 bilateral       PATIENT EDUCATION:  Education details: Updated HEP. Instructed patient to perform standing exercises on with a stable support (e.g. kitchen counter) and have her husband next to her. Educated on possible anxiety related/multiple systems contributing to symptoms Person educated: Patient Education method: Explanation Education comprehension: verbalized understanding  HOME EXERCISE PROGRAM: Access Code: [email or web address] URL: [email or web address]  08/15/22 - Lateral Step Down (Mirrored)  - 1 x daily - 7 x weekly - 2 sets - 10 reps - Side Lunge with Counter Support  - 1 x daily - 7 x weekly - 2 sets - 10 reps  08/09/22 - Standing Anti-Rotation Press with Anchored Resistance  - 1 x daily - 7 x weekly - 2 sets - 10 reps  07/25/22 - Forward Step with Same Side Arm Raise  - 1 x daily - 7 x weekly - 2 sets - 10 reps  07/23/22- Supine Diaphragmatic Breathing  - 1 x  daily - 7 x weekly - 10 reps - Seated Diaphragmatic Breathing  - 1 x daily - 7 x weekly - 10 reps - Step Up  - 1-2 x daily - 7 x weekly - 2 sets - 10 reps  07/17/22 - Sit to Stand with Arms Crossed  - 1 x daily - 7 x weekly - 1-2 sets - 10 reps  07/13/22: - Standing 3-Way Leg Reach with Resistance at Ankles and Counter Support  - 1 x daily - 7 x weekly - 2 sets - 10 reps  07/02/22 hip abduction, small bos standing and regular standing head turns and nods  06/28/2022 - Mini Squat with Counter Support  - 1-2 x daily - 5-7 x weekly - 2 sets - 10 reps - 3 hold - Standing Hip Extension with Counter Support  - 1-2 x daily - 5-7 x weekly - 2 sets - 10 reps  Date: 06/27/2022 Prepared by: Krystal Clark  Exercises - Seated Calf Stretch with Strap  - 1-2 x daily - 5-7 x weekly - 3 reps - 30 hold - Standing Tandem Balance with Counter Support  - 1-2 x daily - 5-7 x weekly - 30 hold - Heel Toe Raises with Counter Support  -  1-2 x daily - 5-7 x weekly - 2 sets - 10 reps  06/21/2022 GAZE STABILIZATION EXERCISES (2-3x/day, 5-7 days/week, seated) These exercises are designed to improve your eyes' ability to track objects without getting dizzy. When performing these exercises, make sure you are facing a blank wall (like a white wall) to avoid distraction. You can wear your glasses/contact lenses if you must.   Before anything, warm-up your neck muscles by bending it forward and back 10 times, side-to-side 10 times, and slowly turn your head left and right 10 time Exercise 1 (Horizontal Saccades) Use two cards for this exercise While holding the cards on each hand side by side in sitting with arms outstretched, look at the first item on the other card then look at the next item on the second card then alternate your gaze sequentially. Do NOT move your cards nor the your head. Do the following for one minute each Identifying numbers Identifying shapes Exercise 2 (Paradigm x1) Use only one card for this  exercise While holding the card in sitting with your arm outstretched, turn your head left and right at your own pace while keeping the card stationary.  Do this for one minute each while you are: Identifying numbers Identifying shapes Exercise 3 (VOR cancellation) Use only one card for this exercise While holding the card in sitting with your arms outstretched, turn your body, arm, and head at the same time left and right at your own pace Do this for one minute each while you are: Identifying numbers Identifying shapes  3/11/20204 - VOR x 1 Habituation Exercises 3x daily - Sit/stands w/ 4.5lb weight at home 2 x 5 daily.  ASSESSMENT:  CLINICAL IMPRESSION: Addressed patients recent low back pain and it is likely due to L lateral lean with ambulation due to RLE weakness. Continued with BLE and functional strengthening which is tolerated well. Improving eccentric strength with lateral step downs. Patient will continue to benefit from physical therapy in order to improve function and reduce impairment.   PROGRESS NOTE 07/10/22: Patient demonstrated continued improvements in function as indicated by positive significant changes in , 5TSTS, and strength. However, patient still presents with deficits in vestibular function which greatly affects her standing balance. Patient still presents as a falls risk based on the Tinetti score. With this, skilled PT is still required to address the impairments and functional limitations listed below.  Interventions today were geared towards gaze stabilization incorporated with her standing balance. Patient tolerated gaze stabilization activities with mild dizziness and unsteadiness on the saccades, VOR 1 and cancellation exercises. Demonstrated appropriate levels of fatigue. Rest periods provided. Provided slight amount of cueing to ensure correct execution of activity with good carry-over.    OBJECTIVE IMPAIRMENTS: Abnormal gait, decreased balance, decreased  coordination, decreased endurance, difficulty walking, decreased strength, impaired flexibility, and pain.   ACTIVITY LIMITATIONS: carrying, lifting, bending, standing, squatting, stairs, transfers, continence, bathing, toileting, dressing, self feeding, hygiene/grooming, and locomotion level  PARTICIPATION LIMITATIONS: meal prep, cleaning, laundry, driving, shopping, community activity, and yard work  PERSONAL FACTORS: Fitness level, Bladder implant (will have surgery 07/26/2022), hx of B THR, lobectomy are also affecting patient's functional outcome.   REHAB POTENTIAL: Fair    CLINICAL DECISION MAKING: Evolving/moderate complexity  EVALUATION COMPLEXITY: Moderate   GOALS: Goals reviewed with patient? Yes  SHORT TERM GOALS: Target date: 07/13/2022  Pt will demonstrate indep in HEP to facilitate carry-over of skilled services and improve functional outcomes Goal status: MET  LONG TERM GOALS: Target date:  08/31/2022  Pt will be report little to no dizziness on VOR 1 and head impulse test to facilitate safety on ambulation Baseline: with dizziness Goal status: INITIAL/NEW   Pt will decrease 5TSTS by at least 5 seconds in order to demonstrate clinically significant improvement in LE strength  Baseline: 18.88 sec Goal status: REVISED  3.  Patient will demonstrate increase in Tinetti Score by 8 points in order to demonstrate clinically significant improvement in balance and decreased risk for falls  Baseline: 9 Goal status: IN PROGRESS  4.  Pt will increase by at least 40 ft in order to demonstrate clinically significant improvement in community ambulation Baseline: 304 ft; 343 ft with rollator 4/29 Goal status: REVISED  5.  Pt will demonstrate increase in LE strength to 4 to facilitate ease and safety in ambulation Baseline: 2+ Goal status: MET  6.  Pt will demonstrate improved LE coordination as manifested by performing 3/4 correct trials in heel-to-shin to facilitate  ease in ambulation Baseline: impaired Goal status: INITIAL  7.  Pt will be able to walk x 224 ft in 2 minutes with a quad cane Baseline: walks with a rollator Goal status: REVISED 08/06/22   PLAN:  PT FREQUENCY: 3x/week  PT DURATION: 4 weeks  PLANNED INTERVENTIONS: Therapeutic exercises, Therapeutic activity, Neuromuscular re-education, Balance training, Gait training, Patient/Family education, Self Care, and Stair training  PLAN FOR NEXT SESSION: Continue 2 x a week for 3 weeks; may progress as tolerated with emphasis on balance, LE strengthening, flexibility, and vestibular function.   1:50 PM, 08/15/22 Wyman Songster PT, DPT Physical Therapist at [facility]

## 2022-08-16 ENCOUNTER — Ambulatory Visit (HOSPITAL_COMMUNITY): Payer: Medicare Other | Admitting: Physical Therapy

## 2022-08-16 ENCOUNTER — Encounter (HOSPITAL_COMMUNITY): Payer: Self-pay | Admitting: Physical Therapy

## 2022-08-16 DIAGNOSIS — M25551 Pain in right hip: Secondary | ICD-10-CM

## 2022-08-16 DIAGNOSIS — R262 Difficulty in walking, not elsewhere classified: Secondary | ICD-10-CM | POA: Diagnosis not present

## 2022-08-16 NOTE — Therapy (Signed)
OUTPATIENT PHYSICAL THERAPY LOWER EXTREMITY TREATMENT NOTE      Patient Name: [name] MRN: [id number] DOB:05/19/1965, 57 y.o., female Today's Date: 08/16/2022  END OF SESSION:  PT End of Session - 08/16/22 1030     Visit Number 24    Number of Visits 28    Date for PT Re-Evaluation 08/31/22    Authorization Type BCBS Medicare, Medicaid of [place]    Progress Note Due on Visit 28    PT Start Time 1031    PT Stop Time 1109    PT Time Calculation (min) 38 min    Equipment Utilized During Treatment Gait belt;Oxygen    Activity Tolerance Patient tolerated treatment well    Behavior During Therapy WFL for tasks assessed/performed             Past Surgical History:  Procedure Laterality Date   ABDOMINAL HYSTERECTOMY     with bladder suspension   CERVICAL FUSION     CHOLECYSTECTOMY     COLONOSCOPY N/A 07/30/2013   Procedure: COLONOSCOPY;  Surgeon: Malissa Hippo, MD;  [facility]: [facility];  Service: Endoscopy;  Laterality: N/A;  930   EXAM UNDER ANESTHESIA WITH MANIPULATION OF SHOULDER Right 10/11/2016   Procedure: EXAM UNDER ANESTHESIA WITH MANIPULATION OF SHOULDER;  Surgeon: Vickki Hearing, MD;  Location: [facility];  Service: Orthopedics;  Laterality: Right;   HERNIA REPAIR     INCISIONAL HERNIA REPAIR N/A 07/30/2016   Procedure: HERNIA REPAIR INCISIONAL WITH MESH;  Surgeon: [name], MD;  Location: [facility];  Service: General;  Laterality: N/A;   INTERSTIM IMPLANT PLACEMENT  02/02/2021   Procedure: Leane Platt IMPLANT FIRST STAGE LEFT SACRUM ;  Surgeon: Malen Gauze, MD;  Location: [facility];  Service: Urology;;   Leane Platt IMPLANT PLACEMENT N/A 02/23/2021   Procedure: Leane Platt IMPLANT SECOND STAGE;  Surgeon: Malen Gauze, MD;  Location: [facility];  Service: Urology;  Laterality: N/A;  Rep coming, time needs to stay at 2:00   JOINT REPLACEMENT     LOBECTOMY     right lung    POSTERIOR CERVICAL LAMINECTOMY Right 04/05/2017   Procedure: Right C6-7  Posterior cervical laminectomy;  Surgeon: Donalee Citrin, MD;  Location[facility];  Service: Neurosurgery;  Laterality: Right;  Right C6-7 Posterior cervical laminectomy   SHOULDER ARTHROSCOPY WITH ROTATOR CUFF REPAIR Right 10/11/2016   Procedure: SHOULDER ARTHROSCOPY;  Surgeon: Vickki Hearing, MD;  Location: [facility];  Service: Orthopedics;  Laterality: Right;   TOTAL HIP ARTHROPLASTY Right 04/15/2020   Procedure: RIGHT TOTAL HIP ARTHROPLASTY ANTERIOR APPROACH;  Surgeon: Kathryne Hitch, MD;  Location: [facility];  Service: Orthopedics;  Laterality: Right;   TOTAL HIP ARTHROPLASTY Left 07/22/2020   Procedure: LEFT  HIP ARTHROPLASTY ANTERIOR APPROACH;  Surgeon: Kathryne Hitch, MD;  Location: [facility];  Service: Orthopedics;  Laterality: Left;  RNFA   TUBAL LIGATION     Patient Active Problem List   Diagnosis Date Noted   Severe persistent asthma without complication 06/06/2022   Gait abnormality 05/28/2022   Adrenal insufficiency (HCC) 03/30/2022   Endocrine disorder, unspecified 07/04/2021   Prediabetes 07/04/2021   Hypothyroidism 07/04/2021   Former smoker 06/29/2021   Seasonal and perennial allergic rhinitis 06/19/2021   Oxygen dependent 06/19/2021   Steroid dependent (HCC) 06/19/2021   Immunosuppressed status (HCC) 06/19/2021   Radiculopathy, lumbar region 12/08/2020   Status post left hip replacement 07/22/2020   Status post right hip replacement 05/02/2020   Status post total replacement of  right hip 04/15/2020   Avascular necrosis of bone of left hip (HCC) 02/10/2020   Avascular necrosis of bone of right hip (HCC) 02/10/2020   Allergic rhinitis 05/21/2019   Asthma-COPD overlap syndrome 04/30/2019   Chronic respiratory failure with hypoxia (HCC) 04/30/2019   Spinal stenosis of cervical region 04/05/2017   Partial tear of right subscapularis tendon    Incisional hernia, without obstruction or gangrene    Urinary frequency 03/21/2012   Migraine equivalent syndrome  03/21/2012   Insomnia 03/21/2012    PCP: Benita Stabile, MD  REFERRING PROVIDER: Levert Feinstein, MD  REFERRING DIAG: R26.9 (ICD-10-CM) - Gait abnormality  THERAPY DIAG:  Difficulty in walking, not elsewhere classified  Pain in right hip  Rationale for Evaluation and Treatment: Rehabilitation  ONSET DATE: April 12, 2022  SUBJECTIVE:   SUBJECTIVE STATEMENT: Patient states some soreness today. Been working on walking without leaning. Would like to soon be able to get off the floor if she needs too.  PROGRESS NOTE 07/10/22: Patient states that she went to her neurologist and was diagnosed with vestibular neuritis. Patient says she will be referred to a neurologist. Patient states that she's still dizzy ("wavy" sensation) but not as bad as she used to be. Patient states that her balance is still off but has improved a little since she started PT. Denies any recent falls. Patient thinks she has improved around 25%. With this, patient wishes to continue with PT.   PERTINENT HISTORY: Bladder implant (will have surgery 07/26/2022), hx of B THR, lobectomy PAIN:  Are you having pain? No  PRECAUTIONS: Fall and Other: uses portable O2  WEIGHT BEARING RESTRICTIONS: No  FALLS:  Has patient fallen in last 6 months? Yes. Number of falls 7  LIVING ENVIRONMENT: Lives with: lives with their spouse Lives in: House/apartment Stairs: Yes: External: 1 steps; none Has following equipment at home: Single point cane, Walker - 2 wheeled, Environmental consultant - 4 wheeled, shower chair, bed side commode, and oxygen  OCCUPATION: on disability  PLOF: Independent with household mobility with device and Requires assistive device for independence  PATIENT GOALS: "to be able to walk straight by myself"  NEXT MD VISIT: Aug 30, 2022  OBJECTIVE:   DIAGNOSTIC FINDINGS:  MRI HEAD WITHOUT CONTRAST 06/08/22   TECHNIQUE: Multiplanar, multiecho pulse sequences of the brain and surrounding structures were obtained without  intravenous contrast.   COMPARISON:  Head CT 05/22/2022. Brain MRI 05/22/2011.   FINDINGS: Brain:   No age advanced or lobar predominant parenchymal atrophy.   Multifocal T2 FLAIR hyperintense signal abnormality within the cerebral white matter, overall mild but greater than expected for age.   There is no acute infarct.   No evidence of an intracranial mass.   No chronic intracranial blood products.   No extra-axial fluid collection.   No midline shift.   Vascular: Maintained flow voids within the proximal large arterial vessels.   Skull and upper cervical spine: No focal suspicious calvarial marrow lesion. Cervical spine findings separately reported on same day cervical spine MRI.   Sinuses/Orbits: No mass or acute finding within the imaged orbits. Prior bilateral ocular lens replacement. Trace mucosal thickening within left ethmoid air cells.   Other: Small-volume fluid within the bilateral mastoid air cells.   IMPRESSION: 1.  No evidence of an acute intracranial abnormality. 2. Multifocal T2 FLAIR hyperintense signal abnormality within the cerebral white matter, overall mild but greater than expected for age. These signal changes have slightly progressed from the prior brain MRI  of 05/22/2011. Findings are nonspecific and differential considerations include chronic small vessel ischemic disease, sequelae of chronic migraine headaches, sequelae of a prior infectious/inflammatory process or sequelae of a demyelinating process (such as multiple sclerosis), among others. No lesions specifically suggest demyelinating disease. 3. Small-volume fluid within the bilateral mastoid air cells.  MRI CERVICAL SPINE WITHOUT CONTRAST 06/08/22   TECHNIQUE: Multiplanar, multisequence MR imaging of the cervical spine was performed. No intravenous contrast was administered.   COMPARISON:  Radiographs of the cervical spine 08/06/2017. Cervical spine MRI 02/24/2017.    FINDINGS: Alignment: Straightening of the expected cervical lordosis. 3 mm C7-T1 grade 1 anterolisthesis.   Vertebrae: Vertebral body height is maintained. Susceptibility artifact arising from ACDF hardware at the C4-C7 levels. No appreciable significant marrow edema or focal suspicious osseous lesion.   Cord: No signal abnormality identified within the cervical spinal cord.   Posterior Fossa, vertebral arteries, paraspinal tissues: Posterior fossa assessed on same-day brain MRI. Flow voids preserved within the imaged cervical vertebral arteries. No paraspinal mass or collection.   Disc levels:   Unless otherwise stated, the level by level findings below have not significantly changed from the prior MRI of 03/06/2017.   Disc degeneration at the non-operative levels, greatest at C3-C4 and C7-T1 (progressed at these levels).   C2-C3: Slight disc bulge. No significant spinal canal or foraminal stenosis.   C3-C4: Disc bulge. New superimposed small central disc protrusion (at site of posterior annular fissure). Uncovertebral hypertrophy on the right, new from the prior MRI. Facet arthrosis. Mild relative spinal canal narrowing (without spinal cord mass effect), new from the prior MRI. Moderate right neural foraminal narrowing, also new from the prior MRI.   C4-C5: Prior ACDF. No significant spinal canal or foraminal stenosis.   C5-C6: Prior ACDF. Uncovertebral hypertrophy on the right. No significant spinal canal stenosis. Mild right neural foraminal narrowing.   C6-C7: Prior ACDF. Uncovertebral vertebrae on the right. No significant spinal canal stenosis. Moderate right neural foraminal narrowing.   C7-T1: 3 mm grade 1 anterolisthesis. Mild disc uncovering. Facet arthrosis. No significant spinal canal stenosis. Mild-to-moderate bilateral neural foraminal narrowing.   IMPRESSION: Prior C4-C7 ACDF. No significant spinal canal stenosis at these levels. As before,  uncovertebral hypertrophy results in neural foraminal narrowing on the right at C5-C6 (mild), and on the right at C6-C7 (moderate).   Cervical spondylosis at the remaining levels, as outlined and with findings most notably as follows.   At C3-C4, there is a disc bulge. New superimposed small central disc protrusion (at site of posterior annular fissure). Uncovertebral hypertrophy on the right, new from the prior MRI. Facet arthrosis. Mild spinal canal narrowing, and moderate right neural foraminal narrowing, new from the prior MRI.   At C7-T1, there is 3 mm grade 1 anterolisthesis. Mild disc uncovering. Facet arthrosis. No significant spinal canal stenosis. Mild-to-moderate bilateral neural foraminal narrowing. Findings at this level have not significantly changed.  COGNITION: Overall cognitive status: Within functional limits for tasks assessed     SENSATION: Not tested  MUSCLE LENGTH: (not tested on 07/10/22) Mild restriction on B hamstrings Moderate restriction on B gastrocsoleus  POSTURE: rounded shoulders and forward head   LOWER EXTREMITY MMT  MMT Right eval Left eval Right 07/10/22 Left 07/10/22 Right 08/06/22 Left 08/06/22  Hip flexion 4 4 4 4  4+ 4+  Hip extension 2+ (bridging) 2+  (bridging) 3+  (bridging) 3+ (bridging) 4 4  Hip abduction 4 4 4 4  4+ 4+  Hip adduction  Hip internal rotation        Hip external rotation        Knee flexion 4- 4- 4 4 4  4+  Knee extension 4- 3+ 4 4 5 5   Ankle dorsiflexion 3+ 4- 4- 4- 4 4+  Ankle plantarflexion 3+ 3+ 3+ 3+    Ankle inversion        Ankle eversion         (Blank rows = not tested)  LOWER EXTREMITY ROM  Active ROM Right eval Left eval  Hip flexion [facility]  Hip extension    Hip abduction [facility]  Hip adduction [facility]  Hip internal rotation    Hip external rotation    Knee flexion [facility] WFL  Knee extension [facility]  Ankle dorsiflexion [facility] WFL  Ankle plantarflexion [facility] WFL  Ankle inversion    Ankle  eversion     (Blank rows = not tested)  FUNCTIONAL TESTS:  5 times sit to stand: 18.88 sec (little assist from rollator) from 26.97 sec (has to hold on to rollator) 2 minute walk test: 304 ft (with rollator) from 224 ft (with a rollator) Tinetti POMA : 14 from 9 (balance + gait)  GAIT: Distance walked: 304 ft Assistive device utilized:  rollator Level of assistance: Modified independence Comments: foot clearance on B more evident, normal step length on B  VESTIBULAR ASSESSMENT  06/21/22 07/10/22  Head impulse test positive with corrective saccades, dizziness reported positive with corrective saccades, dizziness reported  VOR 1 corrective saccades, dizziness reported corrective saccades, dizziness reported  Optokinetic reflex (R/L)  impaired, dizziness reported  impaired, dizziness reported    TODAY'S TREATMENT:                                                                                                                              DATE:  08/16/22 Lateral step down 6 inch 2 x 10 bilateral  Lateral lunge 2 x 10 bilateral  Mini squat with 3 way slide outs 2 x 5 bilateral  Lunge transitions to 8 inch box with blue foam 2 x 10 bilateral    08/15/22 Gait with SPC 450 feet Lateral step down 6 inch 2 x 10 bilateral  Lateral lunge 2 x 10 bilateral   08/09/22 Gait with SPC 675 feet Standing 3 way hip GTB at knees 1 x 5 bilateral  Palof press RB 1 x 10 bilateral   08/07/22 Gait with SBQC 226 feet, SPC 450 feet Sit to stand holding yellow med ball 2 x 10  Lateral stepping 6 x 10 feet Seated piriformis stretch 2 x 20 second holds bilateral    08/06/2022 Progress note 5 times sit to stand 18.32 sec no UE assist; CGA for safety 2 min walk test with rollator 343 ft; 131 ft with QC and CGA MMT's see above  08/03/2022 Sit to stand with red ball 2 x 10 Foam balance with UE movement with dowel bilateral  shoulder flexion and then trunk rotation x 8 each Foam marching 2 x 10 Tandem stance 2  x 30" each Gait training with SBQC x 225 ft; step over low hurdles and around cones   08/01/2022  -Tandem stance 2 x 1 min bilaterally; intermittently UE touching -Modified single leg balnce standing with blue balance beam with UE 1lb passing 3 x 12 repetitions. Use of bodyblade for coordination skill practice. -8in stepups 6in box and 2in blue foam pad 2 x 8-10. Cues for intermittent HHA on parallel bars.  -251ft SBQC ambulation; CGA--->supervision level     PATIENT EDUCATION:  Education details: Updated HEP. Instructed patient to perform standing exercises on with a stable support (e.g. kitchen counter) and have her husband next to her. Educated on possible anxiety related/multiple systems contributing to symptoms Person educated: Patient Education method: Explanation Education comprehension: verbalized understanding  HOME EXERCISE PROGRAM: Access Code: [email or web address] URL: [email or web address]  08/16/22 - Single Leg Balance with Four Way Reach and Rotation  - 1 x daily - 7 x weekly - 2 sets - 5 reps - Forward Lunge with Back Leg Straight and Counter Support  - 1 x daily - 7 x weekly - 2 sets - 10 reps  08/15/22 - Lateral Step Down (Mirrored)  - 1 x daily - 7 x weekly - 2 sets - 10 reps - Side Lunge with Counter Support  - 1 x daily - 7 x weekly - 2 sets - 10 reps  08/09/22 - Standing Anti-Rotation Press with Anchored Resistance  - 1 x daily - 7 x weekly - 2 sets - 10 reps  07/25/22 - Forward Step with Same Side Arm Raise  - 1 x daily - 7 x weekly - 2 sets - 10 reps  07/23/22- Supine Diaphragmatic Breathing  - 1 x daily - 7 x weekly - 10 reps - Seated Diaphragmatic Breathing  - 1 x daily - 7 x weekly - 10 reps - Step Up  - 1-2 x daily - 7 x weekly - 2 sets - 10 reps  07/17/22 - Sit to Stand with Arms Crossed  - 1 x daily - 7 x weekly - 1-2 sets - 10 reps  07/13/22: - Standing 3-Way Leg Reach with Resistance at Ankles and Counter Support  - 1 x daily - 7 x weekly - 2 sets - 10  reps  07/02/22 hip abduction, small bos standing and regular standing head turns and nods  06/28/2022 - Mini Squat with Counter Support  - 1-2 x daily - 5-7 x weekly - 2 sets - 10 reps - 3 hold - Standing Hip Extension with Counter Support  - 1-2 x daily - 5-7 x weekly - 2 sets - 10 reps  Date: 06/27/2022 Prepared by: Krystal Clark  Exercises - Seated Calf Stretch with Strap  - 1-2 x daily - 5-7 x weekly - 3 reps - 30 hold - Standing Tandem Balance with Counter Support  - 1-2 x daily - 5-7 x weekly - 30 hold - Heel Toe Raises with Counter Support  - 1-2 x daily - 5-7 x weekly - 2 sets - 10 reps  06/21/2022 GAZE STABILIZATION EXERCISES (2-3x/day, 5-7 days/week, seated) These exercises are designed to improve your eyes' ability to track objects without getting dizzy. When performing these exercises, make sure you are facing a blank wall (like a white wall) to avoid distraction. You can wear your glasses/contact lenses if you must.   Before anything, warm-up your neck muscles  by bending it forward and back 10 times, side-to-side 10 times, and slowly turn your head left and right 10 time Exercise 1 (Horizontal Saccades) Use two cards for this exercise While holding the cards on each hand side by side in sitting with arms outstretched, look at the first item on the other card then look at the next item on the second card then alternate your gaze sequentially. Do NOT move your cards nor the your head. Do the following for one minute each Identifying numbers Identifying shapes Exercise 2 (Paradigm x1) Use only one card for this exercise While holding the card in sitting with your arm outstretched, turn your head left and right at your own pace while keeping the card stationary.  Do this for one minute each while you are: Identifying numbers Identifying shapes Exercise 3 (VOR cancellation) Use only one card for this exercise While holding the card in sitting with your arms outstretched,  turn your body, arm, and head at the same time left and right at your own pace Do this for one minute each while you are: Identifying numbers Identifying shapes  3/11/20204 - VOR x 1 Habituation Exercises 3x daily - Sit/stands w/ 4.5lb weight at home 2 x 5 daily.  ASSESSMENT:  CLINICAL IMPRESSION: Patient with good mechanics with previously completed exercises. Mild dynamic knee valgus due to gluteal weakness bilaterally but R>L. Continued with BLE and functional strengthening which is tolerated well. Improving eccentric strength and motor control with lateral step downs. Additional functional strengthening with lunge transitions to box as floor to stand transfers are limited at home.  Fatigue 8/10 at EOS. Patient will continue to benefit from physical therapy in order to improve function and reduce impairment.   PROGRESS NOTE 07/10/22: Patient demonstrated continued improvements in function as indicated by positive significant changes in , 5TSTS, and strength. However, patient still presents with deficits in vestibular function which greatly affects her standing balance. Patient still presents as a falls risk based on the Tinetti score. With this, skilled PT is still required to address the impairments and functional limitations listed below.  Interventions today were geared towards gaze stabilization incorporated with her standing balance. Patient tolerated gaze stabilization activities with mild dizziness and unsteadiness on the saccades, VOR 1 and cancellation exercises. Demonstrated appropriate levels of fatigue. Rest periods provided. Provided slight amount of cueing to ensure correct execution of activity with good carry-over.    OBJECTIVE IMPAIRMENTS: Abnormal gait, decreased balance, decreased coordination, decreased endurance, difficulty walking, decreased strength, impaired flexibility, and pain.   ACTIVITY LIMITATIONS: carrying, lifting, bending, standing, squatting, stairs,  transfers, continence, bathing, toileting, dressing, self feeding, hygiene/grooming, and locomotion level  PARTICIPATION LIMITATIONS: meal prep, cleaning, laundry, driving, shopping, community activity, and yard work  PERSONAL FACTORS: Fitness level, Bladder implant (will have surgery 07/26/2022), hx of B THR, lobectomy are also affecting patient's functional outcome.   REHAB POTENTIAL: Fair    CLINICAL DECISION MAKING: Evolving/moderate complexity  EVALUATION COMPLEXITY: Moderate   GOALS: Goals reviewed with patient? Yes  SHORT TERM GOALS: Target date: 07/13/2022  Pt will demonstrate indep in HEP to facilitate carry-over of skilled services and improve functional outcomes Goal status: MET  LONG TERM GOALS: Target date: 08/31/2022  Pt will be report little to no dizziness on VOR 1 and head impulse test to facilitate safety on ambulation Baseline: with dizziness Goal status: INITIAL/NEW   Pt will decrease 5TSTS by at least 5 seconds in order to demonstrate clinically significant improvement in LE strength  Baseline: 18.88 sec Goal status: REVISED  3.  Patient will demonstrate increase in Tinetti Score by 8 points in order to demonstrate clinically significant improvement in balance and decreased risk for falls  Baseline: 9 Goal status: IN PROGRESS  4.  Pt will increase by at least 40 ft in order to demonstrate clinically significant improvement in community ambulation Baseline: 304 ft; 343 ft with rollator 4/29 Goal status: REVISED  5.  Pt will demonstrate increase in LE strength to 4 to facilitate ease and safety in ambulation Baseline: 2+ Goal status: MET  6.  Pt will demonstrate improved LE coordination as manifested by performing 3/4 correct trials in heel-to-shin to facilitate ease in ambulation Baseline: impaired Goal status: INITIAL  7.  Pt will be able to walk x 224 ft in 2 minutes with a quad cane Baseline: walks with a rollator Goal status: REVISED  08/06/22   PLAN:  PT FREQUENCY: 3x/week  PT DURATION: 4 weeks  PLANNED INTERVENTIONS: Therapeutic exercises, Therapeutic activity, Neuromuscular re-education, Balance training, Gait training, Patient/Family education, Self Care, and Stair training  PLAN FOR NEXT SESSION: Continue 2 x a week for 3 weeks; may progress as tolerated with emphasis on balance, LE strengthening, flexibility, and vestibular function.   11:10 AM, 08/16/22 Wyman Songster PT, DPT Physical Therapist at [facility]

## 2022-08-21 ENCOUNTER — Ambulatory Visit (HOSPITAL_COMMUNITY): Payer: Medicare Other | Admitting: Physical Therapy

## 2022-08-21 ENCOUNTER — Encounter (HOSPITAL_COMMUNITY): Payer: Self-pay | Admitting: Physical Therapy

## 2022-08-21 DIAGNOSIS — R262 Difficulty in walking, not elsewhere classified: Secondary | ICD-10-CM

## 2022-08-21 DIAGNOSIS — M25551 Pain in right hip: Secondary | ICD-10-CM

## 2022-08-21 NOTE — Therapy (Signed)
OUTPATIENT PHYSICAL THERAPY LOWER EXTREMITY TREATMENT NOTE      Patient Name: [name] MRN: [id number] DOB:April 27, 1965, 57 y.o., female Today's Date: 08/21/2022  END OF SESSION:  PT End of Session - 08/21/22 1118     Visit Number 25    Number of Visits 28    Date for PT Re-Evaluation 08/31/22    Authorization Type BCBS Medicare, Medicaid of [place]    Progress Note Due on Visit 28    PT Start Time 1120    PT Stop Time 1200    PT Time Calculation (min) 40 min    Equipment Utilized During Treatment Gait belt;Oxygen    Activity Tolerance Patient tolerated treatment well    Behavior During Therapy WFL for tasks assessed/performed             Past Surgical History:  Procedure Laterality Date   ABDOMINAL HYSTERECTOMY     with bladder suspension   CERVICAL FUSION     CHOLECYSTECTOMY     COLONOSCOPY N/A 07/30/2013   Procedure: COLONOSCOPY;  Surgeon: Malissa Hippo, MD;  [facility]: [facility];  Service: Endoscopy;  Laterality: N/A;  930   EXAM UNDER ANESTHESIA WITH MANIPULATION OF SHOULDER Right 10/11/2016   Procedure: EXAM UNDER ANESTHESIA WITH MANIPULATION OF SHOULDER;  Surgeon: Vickki Hearing, MD;  Location: [facility];  Service: Orthopedics;  Laterality: Right;   HERNIA REPAIR     INCISIONAL HERNIA REPAIR N/A 07/30/2016   Procedure: HERNIA REPAIR INCISIONAL WITH MESH;  Surgeon: [name], MD;  Location: [facility];  Service: General;  Laterality: N/A;   INTERSTIM IMPLANT PLACEMENT  02/02/2021   Procedure: Leane Platt IMPLANT FIRST STAGE LEFT SACRUM ;  Surgeon: Malen Gauze, MD;  Location: [facility];  Service: Urology;;   Leane Platt IMPLANT PLACEMENT N/A 02/23/2021   Procedure: Leane Platt IMPLANT SECOND STAGE;  Surgeon: Malen Gauze, MD;  Location: [facility];  Service: Urology;  Laterality: N/A;  Rep coming, time needs to stay at 2:00   JOINT REPLACEMENT     LOBECTOMY     right lung    POSTERIOR CERVICAL LAMINECTOMY Right 04/05/2017   Procedure: Right C6-7  Posterior cervical laminectomy;  Surgeon: Donalee Citrin, MD;  Location: [facility] OR;  Service: Neurosurgery;  Laterality: Right;  Right C6-7 Posterior cervical laminectomy   SHOULDER ARTHROSCOPY WITH ROTATOR CUFF REPAIR Right 10/11/2016   Procedure: SHOULDER ARTHROSCOPY;  Surgeon: Vickki Hearing, MD;  Location: [facility];  Service: Orthopedics;  Laterality: Right;   TOTAL HIP ARTHROPLASTY Right 04/15/2020   Procedure: RIGHT TOTAL HIP ARTHROPLASTY ANTERIOR APPROACH;  Surgeon: Kathryne Hitch, MD;  Location: [facility];  Service: Orthopedics;  Laterality: Right;   TOTAL HIP ARTHROPLASTY Left 07/22/2020   Procedure: LEFT  HIP ARTHROPLASTY ANTERIOR APPROACH;  Surgeon: Kathryne Hitch, MD;  Location: [facility];  Service: Orthopedics;  Laterality: Left;  RNFA   TUBAL LIGATION     Patient Active Problem List   Diagnosis Date Noted   Severe persistent asthma without complication 06/06/2022   Gait abnormality 05/28/2022   Adrenal insufficiency (HCC) 03/30/2022   Endocrine disorder, unspecified 07/04/2021   Prediabetes 07/04/2021   Hypothyroidism 07/04/2021   Former smoker 06/29/2021   Seasonal and perennial allergic rhinitis 06/19/2021   Oxygen dependent 06/19/2021   Steroid dependent (HCC) 06/19/2021   Immunosuppressed status (HCC) 06/19/2021   Radiculopathy, lumbar region 12/08/2020   Status post left hip replacement 07/22/2020   Status post right hip replacement 05/02/2020   Status post total replacement of  right hip 04/15/2020   Avascular necrosis of bone of left hip (HCC) 02/10/2020   Avascular necrosis of bone of right hip (HCC) 02/10/2020   Allergic rhinitis 05/21/2019   Asthma-COPD overlap syndrome 04/30/2019   Chronic respiratory failure with hypoxia (HCC) 04/30/2019   Spinal stenosis of cervical region 04/05/2017   Partial tear of right subscapularis tendon    Incisional hernia, without obstruction or gangrene    Urinary frequency 03/21/2012   Migraine equivalent syndrome  03/21/2012   Insomnia 03/21/2012    PCP: Benita Stabile, MD  REFERRING PROVIDER: Levert Feinstein, MD  REFERRING DIAG: R26.9 (ICD-10-CM) - Gait abnormality  THERAPY DIAG:  Difficulty in walking, not elsewhere classified  Pain in right hip  Rationale for Evaluation and Treatment: Rehabilitation  ONSET DATE: April 12, 2022  SUBJECTIVE:   SUBJECTIVE STATEMENT: Patient states feels disoriented due to all the testing she had on her head yesterday. Trouble with lateral step downs at home.  PROGRESS NOTE 07/10/22: Patient states that she went to her neurologist and was diagnosed with vestibular neuritis. Patient says she will be referred to a neurologist. Patient states that she's still dizzy ("wavy" sensation) but not as bad as she used to be. Patient states that her balance is still off but has improved a little since she started PT. Denies any recent falls. Patient thinks she has improved around 25%. With this, patient wishes to continue with PT.   PERTINENT HISTORY: Bladder implant (will have surgery 07/26/2022), hx of B THR, lobectomy PAIN:  Are you having pain? No  PRECAUTIONS: Fall and Other: uses portable O2  WEIGHT BEARING RESTRICTIONS: No  FALLS:  Has patient fallen in last 6 months? Yes. Number of falls 7  LIVING ENVIRONMENT: Lives with: lives with their spouse Lives in: House/apartment Stairs: Yes: External: 1 steps; none Has following equipment at home: Single point cane, Walker - 2 wheeled, Environmental consultant - 4 wheeled, shower chair, bed side commode, and oxygen  OCCUPATION: on disability  PLOF: Independent with household mobility with device and Requires assistive device for independence  PATIENT GOALS: "to be able to walk straight by myself"  NEXT MD VISIT: Aug 30, 2022  OBJECTIVE:   DIAGNOSTIC FINDINGS:  MRI HEAD WITHOUT CONTRAST 06/08/22   TECHNIQUE: Multiplanar, multiecho pulse sequences of the brain and surrounding structures were obtained without intravenous  contrast.   COMPARISON:  Head CT 05/22/2022. Brain MRI 05/22/2011.   FINDINGS: Brain:   No age advanced or lobar predominant parenchymal atrophy.   Multifocal T2 FLAIR hyperintense signal abnormality within the cerebral white matter, overall mild but greater than expected for age.   There is no acute infarct.   No evidence of an intracranial mass.   No chronic intracranial blood products.   No extra-axial fluid collection.   No midline shift.   Vascular: Maintained flow voids within the proximal large arterial vessels.   Skull and upper cervical spine: No focal suspicious calvarial marrow lesion. Cervical spine findings separately reported on same day cervical spine MRI.   Sinuses/Orbits: No mass or acute finding within the imaged orbits. Prior bilateral ocular lens replacement. Trace mucosal thickening within left ethmoid air cells.   Other: Small-volume fluid within the bilateral mastoid air cells.   IMPRESSION: 1.  No evidence of an acute intracranial abnormality. 2. Multifocal T2 FLAIR hyperintense signal abnormality within the cerebral white matter, overall mild but greater than expected for age. These signal changes have slightly progressed from the prior brain MRI of 05/22/2011. Findings are  nonspecific and differential considerations include chronic small vessel ischemic disease, sequelae of chronic migraine headaches, sequelae of a prior infectious/inflammatory process or sequelae of a demyelinating process (such as multiple sclerosis), among others. No lesions specifically suggest demyelinating disease. 3. Small-volume fluid within the bilateral mastoid air cells.  MRI CERVICAL SPINE WITHOUT CONTRAST 06/08/22   TECHNIQUE: Multiplanar, multisequence MR imaging of the cervical spine was performed. No intravenous contrast was administered.   COMPARISON:  Radiographs of the cervical spine 08/06/2017. Cervical spine MRI 02/24/2017.   FINDINGS: Alignment:  Straightening of the expected cervical lordosis. 3 mm C7-T1 grade 1 anterolisthesis.   Vertebrae: Vertebral body height is maintained. Susceptibility artifact arising from ACDF hardware at the C4-C7 levels. No appreciable significant marrow edema or focal suspicious osseous lesion.   Cord: No signal abnormality identified within the cervical spinal cord.   Posterior Fossa, vertebral arteries, paraspinal tissues: Posterior fossa assessed on same-day brain MRI. Flow voids preserved within the imaged cervical vertebral arteries. No paraspinal mass or collection.   Disc levels:   Unless otherwise stated, the level by level findings below have not significantly changed from the prior MRI of 03/06/2017.   Disc degeneration at the non-operative levels, greatest at C3-C4 and C7-T1 (progressed at these levels).   C2-C3: Slight disc bulge. No significant spinal canal or foraminal stenosis.   C3-C4: Disc bulge. New superimposed small central disc protrusion (at site of posterior annular fissure). Uncovertebral hypertrophy on the right, new from the prior MRI. Facet arthrosis. Mild relative spinal canal narrowing (without spinal cord mass effect), new from the prior MRI. Moderate right neural foraminal narrowing, also new from the prior MRI.   C4-C5: Prior ACDF. No significant spinal canal or foraminal stenosis.   C5-C6: Prior ACDF. Uncovertebral hypertrophy on the right. No significant spinal canal stenosis. Mild right neural foraminal narrowing.   C6-C7: Prior ACDF. Uncovertebral vertebrae on the right. No significant spinal canal stenosis. Moderate right neural foraminal narrowing.   C7-T1: 3 mm grade 1 anterolisthesis. Mild disc uncovering. Facet arthrosis. No significant spinal canal stenosis. Mild-to-moderate bilateral neural foraminal narrowing.   IMPRESSION: Prior C4-C7 ACDF. No significant spinal canal stenosis at these levels. As before, uncovertebral hypertrophy  results in neural foraminal narrowing on the right at C5-C6 (mild), and on the right at C6-C7 (moderate).   Cervical spondylosis at the remaining levels, as outlined and with findings most notably as follows.   At C3-C4, there is a disc bulge. New superimposed small central disc protrusion (at site of posterior annular fissure). Uncovertebral hypertrophy on the right, new from the prior MRI. Facet arthrosis. Mild spinal canal narrowing, and moderate right neural foraminal narrowing, new from the prior MRI.   At C7-T1, there is 3 mm grade 1 anterolisthesis. Mild disc uncovering. Facet arthrosis. No significant spinal canal stenosis. Mild-to-moderate bilateral neural foraminal narrowing. Findings at this level have not significantly changed.  COGNITION: Overall cognitive status: Within functional limits for tasks assessed     SENSATION: Not tested  MUSCLE LENGTH: (not tested on 07/10/22) Mild restriction on B hamstrings Moderate restriction on B gastrocsoleus  POSTURE: rounded shoulders and forward head   LOWER EXTREMITY MMT  MMT Right eval Left eval Right 07/10/22 Left 07/10/22 Right 08/06/22 Left 08/06/22  Hip flexion 4 4 4 4  4+ 4+  Hip extension 2+ (bridging) 2+  (bridging) 3+  (bridging) 3+ (bridging) 4 4  Hip abduction 4 4 4 4  4+ 4+  Hip adduction        Hip internal  rotation        Hip external rotation        Knee flexion 4- 4- 4 4 4  4+  Knee extension 4- 3+ 4 4 5 5   Ankle dorsiflexion 3+ 4- 4- 4- 4 4+  Ankle plantarflexion 3+ 3+ 3+ 3+    Ankle inversion        Ankle eversion         (Blank rows = not tested)  LOWER EXTREMITY ROM  Active ROM Right eval Left eval  Hip flexion [facility]  Hip extension    Hip abduction [facility]  Hip adduction [facility]  Hip internal rotation    Hip external rotation    Knee flexion [facility] WFL  Knee extension [facility]  Ankle dorsiflexion [facility] WFL  Ankle plantarflexion [facility] WFL  Ankle inversion    Ankle eversion     (Blank rows  = not tested)  FUNCTIONAL TESTS:  5 times sit to stand: 18.88 sec (little assist from rollator) from 26.97 sec (has to hold on to rollator) 2 minute walk test: 304 ft (with rollator) from 224 ft (with a rollator) Tinetti POMA : 14 from 9 (balance + gait)  GAIT: Distance walked: 304 ft Assistive device utilized:  rollator Level of assistance: Modified independence Comments: foot clearance on B more evident, normal step length on B  VESTIBULAR ASSESSMENT  06/21/22 07/10/22  Head impulse test positive with corrective saccades, dizziness reported positive with corrective saccades, dizziness reported  VOR 1 corrective saccades, dizziness reported corrective saccades, dizziness reported  Optokinetic reflex (R/L)  impaired, dizziness reported  impaired, dizziness reported    TODAY'S TREATMENT:                                                                                                                              DATE:  08/21/22 Lateral step down 6 inch 2 x 10 bilateral  Lunge transitions to 8 inch box with blue foam 1 x 10 bilateral  Lunge transitions to 6 inch box with blue foam 1 x 10 bilateral  Hip hike on edge of step 2 x 10 bilateral  Palof press GTB 1 x 10 bilateral  Row GTB 1 x 10 Shoulder extension GTB 1 x 10   08/16/22 Lateral step down 6 inch 2 x 10 bilateral  Lateral lunge 2 x 10 bilateral  Mini squat with 3 way slide outs 2 x 5 bilateral  Lunge transitions to 8 inch box with blue foam 2 x 10 bilateral    08/15/22 Gait with SPC 450 feet Lateral step down 6 inch 2 x 10 bilateral  Lateral lunge 2 x 10 bilateral   08/09/22 Gait with SPC 675 feet Standing 3 way hip GTB at knees 1 x 5 bilateral  Palof press RB 1 x 10 bilateral   08/07/22 Gait with SBQC 226 feet, SPC 450 feet Sit to stand holding yellow med ball 2 x 10  Lateral  stepping 6 x 10 feet Seated piriformis stretch 2 x 20 second holds bilateral    08/06/2022 Progress note 5 times sit to stand 18.32 sec no UE  assist; CGA for safety 2 min walk test with rollator 343 ft; 131 ft with QC and CGA MMT's see above  08/03/2022 Sit to stand with red ball 2 x 10 Foam balance with UE movement with dowel bilateral shoulder flexion and then trunk rotation x 8 each Foam marching 2 x 10 Tandem stance 2 x 30" each Gait training with SBQC x 225 ft; step over low hurdles and around cones     PATIENT EDUCATION:  Education details: Updated HEP. Instructed patient to perform standing exercises on with a stable support (e.g. kitchen counter) and have her husband next to her. Educated on possible anxiety related/multiple systems contributing to symptoms Person educated: Patient Education method: Explanation Education comprehension: verbalized understanding  HOME EXERCISE PROGRAM: Access Code: [email or web address] URL: [email or web address]  08/21/22 - Standing Shoulder Row with Anchored Resistance  - 1 x daily - 7 x weekly - 2 sets - 10 reps - Shoulder extension with resistance - Neutral  - 1 x daily - 7 x weekly - 2 sets - 10 reps - Standing Anti-Rotation Press with Anchored Resistance  - 1 x daily - 7 x weekly - 2 sets - 10 reps  08/16/22 - Single Leg Balance with Four Way Reach and Rotation  - 1 x daily - 7 x weekly - 2 sets - 5 reps - Forward Lunge with Back Leg Straight and Counter Support  - 1 x daily - 7 x weekly - 2 sets - 10 reps  08/15/22 - Lateral Step Down (Mirrored)  - 1 x daily - 7 x weekly - 2 sets - 10 reps - Side Lunge with Counter Support  - 1 x daily - 7 x weekly - 2 sets - 10 reps  08/09/22 - Standing Anti-Rotation Press with Anchored Resistance  - 1 x daily - 7 x weekly - 2 sets - 10 reps  07/25/22 - Forward Step with Same Side Arm Raise  - 1 x daily - 7 x weekly - 2 sets - 10 reps  07/23/22- Supine Diaphragmatic Breathing  - 1 x daily - 7 x weekly - 10 reps - Seated Diaphragmatic Breathing  - 1 x daily - 7 x weekly - 10 reps - Step Up  - 1-2 x daily - 7 x weekly - 2 sets - 10 reps  07/17/22  - Sit to Stand with Arms Crossed  - 1 x daily - 7 x weekly - 1-2 sets - 10 reps  07/13/22: - Standing 3-Way Leg Reach with Resistance at Ankles and Counter Support  - 1 x daily - 7 x weekly - 2 sets - 10 reps  07/02/22 hip abduction, small bos standing and regular standing head turns and nods  06/28/2022 - Mini Squat with Counter Support  - 1-2 x daily - 5-7 x weekly - 2 sets - 10 reps - 3 hold - Standing Hip Extension with Counter Support  - 1-2 x daily - 5-7 x weekly - 2 sets - 10 reps  Date: 06/27/2022 Prepared by: Krystal Clark  Exercises - Seated Calf Stretch with Strap  - 1-2 x daily - 5-7 x weekly - 3 reps - 30 hold - Standing Tandem Balance with Counter Support  - 1-2 x daily - 5-7 x weekly - 30 hold - Heel Toe Raises with Counter Support  -  1-2 x daily - 5-7 x weekly - 2 sets - 10 reps  06/21/2022 GAZE STABILIZATION EXERCISES (2-3x/day, 5-7 days/week, seated) These exercises are designed to improve your eyes' ability to track objects without getting dizzy. When performing these exercises, make sure you are facing a blank wall (like a white wall) to avoid distraction. You can wear your glasses/contact lenses if you must.   Before anything, warm-up your neck muscles by bending it forward and back 10 times, side-to-side 10 times, and slowly turn your head left and right 10 time Exercise 1 (Horizontal Saccades) Use two cards for this exercise While holding the cards on each hand side by side in sitting with arms outstretched, look at the first item on the other card then look at the next item on the second card then alternate your gaze sequentially. Do NOT move your cards nor the your head. Do the following for one minute each Identifying numbers Identifying shapes Exercise 2 (Paradigm x1) Use only one card for this exercise While holding the card in sitting with your arm outstretched, turn your head left and right at your own pace while keeping the card stationary.  Do this for  one minute each while you are: Identifying numbers Identifying shapes Exercise 3 (VOR cancellation) Use only one card for this exercise While holding the card in sitting with your arms outstretched, turn your body, arm, and head at the same time left and right at your own pace Do this for one minute each while you are: Identifying numbers Identifying shapes  3/11/20204 - VOR x 1 Habituation Exercises 3x daily - Sit/stands w/ 4.5lb weight at home 2 x 5 daily.  ASSESSMENT:  CLINICAL IMPRESSION: Patient attempts lateral step down without HHA but is very unsteady, mechanics and balance improve with unilateral HHA. Able to perform lunge transition with lower step height with improving strength. Added hip hike for additional glute strength. Began resisted postural strengthening while working on balance with slightly NBOS when able. Fatigue 9/10 at EOS.  Patient will continue to benefit from physical therapy in order to improve function and reduce impairment.   PROGRESS NOTE 07/10/22: Patient demonstrated continued improvements in function as indicated by positive significant changes in , 5TSTS, and strength. However, patient still presents with deficits in vestibular function which greatly affects her standing balance. Patient still presents as a falls risk based on the Tinetti score. With this, skilled PT is still required to address the impairments and functional limitations listed below.  Interventions today were geared towards gaze stabilization incorporated with her standing balance. Patient tolerated gaze stabilization activities with mild dizziness and unsteadiness on the saccades, VOR 1 and cancellation exercises. Demonstrated appropriate levels of fatigue. Rest periods provided. Provided slight amount of cueing to ensure correct execution of activity with good carry-over.    OBJECTIVE IMPAIRMENTS: Abnormal gait, decreased balance, decreased coordination, decreased endurance, difficulty  walking, decreased strength, impaired flexibility, and pain.   ACTIVITY LIMITATIONS: carrying, lifting, bending, standing, squatting, stairs, transfers, continence, bathing, toileting, dressing, self feeding, hygiene/grooming, and locomotion level  PARTICIPATION LIMITATIONS: meal prep, cleaning, laundry, driving, shopping, community activity, and yard work  PERSONAL FACTORS: Fitness level, Bladder implant (will have surgery 07/26/2022), hx of B THR, lobectomy are also affecting patient's functional outcome.   REHAB POTENTIAL: Fair    CLINICAL DECISION MAKING: Evolving/moderate complexity  EVALUATION COMPLEXITY: Moderate   GOALS: Goals reviewed with patient? Yes  SHORT TERM GOALS: Target date: 07/13/2022  Pt will demonstrate indep in HEP to facilitate  carry-over of skilled services and improve functional outcomes Goal status: MET  LONG TERM GOALS: Target date: 08/31/2022  Pt will be report little to no dizziness on VOR 1 and head impulse test to facilitate safety on ambulation Baseline: with dizziness Goal status: INITIAL/NEW   Pt will decrease 5TSTS by at least 5 seconds in order to demonstrate clinically significant improvement in LE strength  Baseline: 18.88 sec Goal status: REVISED  3.  Patient will demonstrate increase in Tinetti Score by 8 points in order to demonstrate clinically significant improvement in balance and decreased risk for falls  Baseline: 9 Goal status: IN PROGRESS  4.  Pt will increase by at least 40 ft in order to demonstrate clinically significant improvement in community ambulation Baseline: 304 ft; 343 ft with rollator 4/29 Goal status: REVISED  5.  Pt will demonstrate increase in LE strength to 4 to facilitate ease and safety in ambulation Baseline: 2+ Goal status: MET  6.  Pt will demonstrate improved LE coordination as manifested by performing 3/4 correct trials in heel-to-shin to facilitate ease in ambulation Baseline: impaired Goal  status: INITIAL  7.  Pt will be able to walk x 224 ft in 2 minutes with a quad cane Baseline: walks with a rollator Goal status: REVISED 08/06/22   PLAN:  PT FREQUENCY: 3x/week  PT DURATION: 4 weeks  PLANNED INTERVENTIONS: Therapeutic exercises, Therapeutic activity, Neuromuscular re-education, Balance training, Gait training, Patient/Family education, Self Care, and Stair training  PLAN FOR NEXT SESSION: Continue 2 x a week for 3 weeks; may progress as tolerated with emphasis on balance, LE strengthening, flexibility, and vestibular function.   11:18 AM, 08/21/22 Wyman Songster PT, DPT Physical Therapist at [facility]

## 2022-08-23 ENCOUNTER — Encounter (HOSPITAL_COMMUNITY): Payer: Medicare Other | Admitting: Physical Therapy

## 2022-08-28 ENCOUNTER — Encounter (HOSPITAL_COMMUNITY): Payer: Self-pay | Admitting: Physical Therapy

## 2022-08-30 ENCOUNTER — Encounter (HOSPITAL_COMMUNITY): Payer: Medicare Other | Admitting: Physical Therapy

## 2022-08-30 ENCOUNTER — Ambulatory Visit: Payer: Medicare Other | Admitting: Neurology

## 2022-08-30 ENCOUNTER — Other Ambulatory Visit (HOSPITAL_COMMUNITY): Payer: Self-pay

## 2022-09-01 ENCOUNTER — Encounter: Payer: Self-pay | Admitting: Urology

## 2022-09-04 ENCOUNTER — Telehealth: Payer: Self-pay

## 2022-09-04 ENCOUNTER — Other Ambulatory Visit (HOSPITAL_COMMUNITY): Payer: Self-pay | Admitting: Internal Medicine

## 2022-09-04 ENCOUNTER — Ambulatory Visit (HOSPITAL_COMMUNITY)
Admission: RE | Admit: 2022-09-04 | Discharge: 2022-09-04 | Disposition: A | Discharge status: Home / Self Care | Payer: Medicare Other | Source: Ambulatory Visit | Attending: Acute Care | Admitting: Acute Care

## 2022-09-04 ENCOUNTER — Other Ambulatory Visit: Payer: Self-pay | Admitting: Acute Care

## 2022-09-04 DIAGNOSIS — Z122 Encounter for screening for malignant neoplasm of respiratory organs: Secondary | ICD-10-CM | POA: Insufficient documentation

## 2022-09-04 DIAGNOSIS — Z87891 Personal history of nicotine dependence: Secondary | ICD-10-CM | POA: Insufficient documentation

## 2022-09-04 DIAGNOSIS — Z1231 Encounter for screening mammogram for malignant neoplasm of breast: Secondary | ICD-10-CM

## 2022-09-04 MED ORDER — BREZTRI AEROSPHERE 160-9-4.8 MCG/ACT IN AERO: 2.0000 | INHALATION_SPRAY | Freq: Two times a day (BID) | RESPIRATORY_TRACT | 4 refills | Status: DC

## 2022-09-04 NOTE — Telephone Encounter (Signed)
Printed off script and I will sign it before I leave today.

## 2022-09-04 NOTE — Telephone Encounter (Signed)
Patient called and needs a new script faxed to [facility] for the [facility] inhaler. Patient needs an 3 month supply at once.   Dr. Dellis Anes can you please sign new script and I will fax it for the patient.    [facility] Fax Number: [phone]

## 2022-09-05 ENCOUNTER — Telehealth: Payer: Self-pay

## 2022-09-05 NOTE — Telephone Encounter (Signed)
Please see the message- patient would like to talk to you about possible dates

## 2022-09-05 NOTE — Telephone Encounter (Signed)
I spoke with [name]. We have discussed possible surgery dates and 10/25/2022 was agreed upon by all parties. Patient given information about surgery date, what to expect pre-operatively and post operatively.    We discussed that a pre-op nurse will be calling to set up the pre-op visit that will take place prior to surgery. Informed patient that our office will communicate any additional care to be provided after surgery.    Patients questions or concerns were discussed during our call. Advised to call our office should there be any additional information, questions or concerns that arise. Patient verbalized understanding.   We will coordinate this procedure with [facility].  Patient aware I will reach out once I get a confirmation on requested date.

## 2022-09-05 NOTE — Telephone Encounter (Signed)
[employer] forms for the [facility] inhaler have been signed by Dr. Dellis Anes and faxed back to [facility] this morning.

## 2022-09-06 ENCOUNTER — Ambulatory Visit (HOSPITAL_COMMUNITY): Payer: Medicare Other

## 2022-09-06 DIAGNOSIS — R262 Difficulty in walking, not elsewhere classified: Secondary | ICD-10-CM

## 2022-09-06 DIAGNOSIS — M25551 Pain in right hip: Secondary | ICD-10-CM

## 2022-09-06 NOTE — Therapy (Signed)
OUTPATIENT PHYSICAL THERAPY LOWER EXTREMITY TREATMENT NOTE/DISCHARGE NOTE PHYSICAL THERAPY DISCHARGE SUMMARY  Visits from Start of Care: 26  Current functional level related to goals / functional outcomes: SEE BELOW   Remaining deficits: See below   Education / Equipment: See below   Patient agrees to discharge. Patient goals were partially met. Patient is being discharged due to being pleased with the current functional level.       Patient Name: [name] MRN: [id number] DOB:[birth year], 57 y.o., female Today's Date: 09/06/2022  END OF SESSION:  PT End of Session - 09/06/22 0901     Visit Number 26    Number of Visits 28    Date for PT Re-Evaluation 08/31/22    Authorization Type BCBS Medicare, Medicaid of [place]    Progress Note Due on Visit 28    PT Start Time 0901    PT Stop Time 0940    PT Time Calculation (min) 39 min    Equipment Utilized During Treatment Gait belt;Oxygen    Activity Tolerance Patient tolerated treatment well    Behavior During Therapy WFL for tasks assessed/performed             Past Surgical History:  Procedure Laterality Date   ABDOMINAL HYSTERECTOMY     with bladder suspension   CERVICAL FUSION     CHOLECYSTECTOMY     COLONOSCOPY N/A 07/30/2013   Procedure: COLONOSCOPY;  Surgeon: Malissa Hippo, MD;  [facility];  Service: Endoscopy;  Laterality: N/A;  930   EXAM UNDER ANESTHESIA WITH MANIPULATION OF SHOULDER Right 10/11/2016   Procedure: EXAM UNDER ANESTHESIA WITH MANIPULATION OF SHOULDER;  Surgeon: Vickki Hearing, MD;  Location: [facility];  Service: Orthopedics;  Laterality: Right;   HERNIA REPAIR     INCISIONAL HERNIA REPAIR N/A 07/30/2016   Procedure: HERNIA REPAIR INCISIONAL WITH MESH;  Surgeon: [name], MD;  Location: [facility];  Service: General;  Laterality: N/A;   INTERSTIM IMPLANT PLACEMENT  02/02/2021   Procedure: Leane Platt IMPLANT FIRST STAGE LEFT SACRUM ;  Surgeon: Malen Gauze, MD;   Location: [facility];  Service: Urology;;   Leane Platt IMPLANT PLACEMENT N/A 02/23/2021   Procedure: Leane Platt IMPLANT SECOND STAGE;  Surgeon: Malen Gauze, MD;  Location: [facility];  Service: Urology;  Laterality: N/A;  Rep coming, time needs to stay at 2:00   JOINT REPLACEMENT     LOBECTOMY     right lung    POSTERIOR CERVICAL LAMINECTOMY Right 04/05/2017   Procedure: Right C6-7 Posterior cervical laminectomy;  Surgeon: Donalee Citrin, MD;  Location: [facility] OR;  Service: Neurosurgery;  Laterality: Right;  Right C6-7 Posterior cervical laminectomy   SHOULDER ARTHROSCOPY WITH ROTATOR CUFF REPAIR Right 10/11/2016   Procedure: SHOULDER ARTHROSCOPY;  Surgeon: Vickki Hearing, MD;  Location: [facility];  Service: Orthopedics;  Laterality: Right;   TOTAL HIP ARTHROPLASTY Right 04/15/2020   Procedure: RIGHT TOTAL HIP ARTHROPLASTY ANTERIOR APPROACH;  Surgeon: Kathryne Hitch, MD;  Location: [facility];  Service: Orthopedics;  Laterality: Right;   TOTAL HIP ARTHROPLASTY Left 07/22/2020   Procedure: LEFT  HIP ARTHROPLASTY ANTERIOR APPROACH;  Surgeon: Kathryne Hitch, MD;  Location: [facility];  Service: Orthopedics;  Laterality: Left;  RNFA   TUBAL LIGATION     Patient Active Problem List   Diagnosis Date Noted   Severe persistent asthma without complication 06/06/2022   Gait abnormality 05/28/2022   Adrenal insufficiency (HCC) 03/30/2022   Endocrine disorder, unspecified 07/04/2021   Prediabetes 07/04/2021  Hypothyroidism 07/04/2021   Former smoker 06/29/2021   Seasonal and perennial allergic rhinitis 06/19/2021   Oxygen dependent 06/19/2021   Steroid dependent (HCC) 06/19/2021   Immunosuppressed status (HCC) 06/19/2021   Radiculopathy, lumbar region 12/08/2020   Status post left hip replacement 07/22/2020   Status post right hip replacement 05/02/2020   Status post total replacement of right hip 04/15/2020   Avascular necrosis of bone of left hip (HCC) 02/10/2020   Avascular necrosis of  bone of right hip (HCC) 02/10/2020   Allergic rhinitis 05/21/2019   Asthma-COPD overlap syndrome 04/30/2019   Chronic respiratory failure with hypoxia (HCC) 04/30/2019   Spinal stenosis of cervical region 04/05/2017   Partial tear of right subscapularis tendon    Incisional hernia, without obstruction or gangrene    Urinary frequency 03/21/2012   Migraine equivalent syndrome 03/21/2012   Insomnia 03/21/2012    PCP: Benita Stabile, MD  REFERRING PROVIDER: Levert Feinstein, MD  REFERRING DIAG: R26.9 (ICD-10-CM) - Gait abnormality  THERAPY DIAG:  Difficulty in walking, not elsewhere classified  Pain in right hip  Rationale for Evaluation and Treatment: Rehabilitation  ONSET DATE: April 12, 2022  SUBJECTIVE:   SUBJECTIVE STATEMENT: Has been sick so has missed a couple weeks due to sickness.  Arrives with QC today. Diagnosed per patient with delayed VOR.  Having a COPD flare up and this makes her anxious.  PROGRESS NOTE 07/10/22: Patient states that she went to her neurologist and was diagnosed with vestibular neuritis. Patient says she will be referred to a neurologist. Patient states that she's still dizzy ("wavy" sensation) but not as bad as she used to be. Patient states that her balance is still off but has improved a little since she started PT. Denies any recent falls. Patient thinks she has improved around 25%. With this, patient wishes to continue with PT.   PERTINENT HISTORY: Bladder implant (will have surgery 07/26/2022), hx of B THR, lobectomy PAIN:  Are you having pain? No  PRECAUTIONS: Fall and Other: uses portable O2  WEIGHT BEARING RESTRICTIONS: No  FALLS:  Has patient fallen in last 6 months? Yes. Number of falls 7  LIVING ENVIRONMENT: Lives with: lives with their spouse Lives in: House/apartment Stairs: Yes: External: 1 steps; none Has following equipment at home: Single point cane, Walker - 2 wheeled, Environmental consultant - 4 wheeled, shower chair, bed side commode, and  oxygen  OCCUPATION: on disability  PLOF: Independent with household mobility with device and Requires assistive device for independence  PATIENT GOALS: "to be able to walk straight by myself"  NEXT MD VISIT: Aug 30, 2022  OBJECTIVE:   DIAGNOSTIC FINDINGS:  MRI HEAD WITHOUT CONTRAST 06/08/22   TECHNIQUE: Multiplanar, multiecho pulse sequences of the brain and surrounding structures were obtained without intravenous contrast.   COMPARISON:  Head CT 05/22/2022. Brain MRI 05/22/2011.   FINDINGS: Brain:   No age advanced or lobar predominant parenchymal atrophy.   Multifocal T2 FLAIR hyperintense signal abnormality within the cerebral white matter, overall mild but greater than expected for age.   There is no acute infarct.   No evidence of an intracranial mass.   No chronic intracranial blood products.   No extra-axial fluid collection.   No midline shift.   Vascular: Maintained flow voids within the proximal large arterial vessels.   Skull and upper cervical spine: No focal suspicious calvarial marrow lesion. Cervical spine findings separately reported on same day cervical spine MRI.   Sinuses/Orbits: No mass or acute finding within the  imaged orbits. Prior bilateral ocular lens replacement. Trace mucosal thickening within left ethmoid air cells.   Other: [name]-volume fluid within the bilateral mastoid air cells.   IMPRESSION: 1.  No evidence of an acute intracranial abnormality. 2. Multifocal T2 FLAIR hyperintense signal abnormality within the cerebral white matter, overall mild but greater than expected for age. These signal changes have slightly progressed from the prior brain MRI of 05/22/2011. Findings are nonspecific and differential considerations include chronic [name] vessel ischemic disease, sequelae of chronic migraine headaches, sequelae of a prior infectious/inflammatory process or sequelae of a demyelinating process (such as multiple sclerosis),  among others. No lesions specifically suggest demyelinating disease. 3. [name]-volume fluid within the bilateral mastoid air cells.  MRI CERVICAL SPINE WITHOUT CONTRAST 06/08/22   TECHNIQUE: Multiplanar, multisequence MR imaging of the cervical spine was performed. No intravenous contrast was administered.   COMPARISON:  Radiographs of the cervical spine 08/06/2017. Cervical spine MRI 02/24/2017.   FINDINGS: Alignment: Straightening of the expected cervical lordosis. 3 mm C7-T1 grade 1 anterolisthesis.   Vertebrae: Vertebral body height is maintained. Susceptibility artifact arising from ACDF hardware at the C4-C7 levels. No appreciable significant marrow edema or focal suspicious osseous lesion.   Cord: No signal abnormality identified within the cervical spinal cord.   Posterior Fossa, vertebral arteries, paraspinal tissues: Posterior fossa assessed on same-day brain MRI. Flow voids preserved within the imaged cervical vertebral arteries. No paraspinal mass or collection.   Disc levels:   Unless otherwise stated, the level by level findings below have not significantly changed from the prior MRI of 03/06/2017.   Disc degeneration at the non-operative levels, greatest at C3-C4 and C7-T1 (progressed at these levels).   C2-C3: Slight disc bulge. No significant spinal canal or foraminal stenosis.   C3-C4: Disc bulge. New superimposed [name] central disc protrusion (at site of posterior annular fissure). Uncovertebral hypertrophy on the right, new from the prior MRI. Facet arthrosis. Mild relative spinal canal narrowing (without spinal cord mass effect), new from the prior MRI. Moderate right neural foraminal narrowing, also new from the prior MRI.   C4-C5: Prior ACDF. No significant spinal canal or foraminal stenosis.   C5-C6: Prior ACDF. Uncovertebral hypertrophy on the right. No significant spinal canal stenosis. Mild right neural foraminal narrowing.   C6-C7: Prior  ACDF. Uncovertebral vertebrae on the right. No significant spinal canal stenosis. Moderate right neural foraminal narrowing.   C7-T1: 3 mm grade 1 anterolisthesis. Mild disc uncovering. Facet arthrosis. No significant spinal canal stenosis. Mild-to-moderate bilateral neural foraminal narrowing.   IMPRESSION: Prior C4-C7 ACDF. No significant spinal canal stenosis at these levels. As before, uncovertebral hypertrophy results in neural foraminal narrowing on the right at C5-C6 (mild), and on the right at C6-C7 (moderate).   Cervical spondylosis at the remaining levels, as outlined and with findings most notably as follows.   At C3-C4, there is a disc bulge. New superimposed [name] central disc protrusion (at site of posterior annular fissure). Uncovertebral hypertrophy on the right, new from the prior MRI. Facet arthrosis. Mild spinal canal narrowing, and moderate right neural foraminal narrowing, new from the prior MRI.   At C7-T1, there is 3 mm grade 1 anterolisthesis. Mild disc uncovering. Facet arthrosis. No significant spinal canal stenosis. Mild-to-moderate bilateral neural foraminal narrowing. Findings at this level have not significantly changed.  COGNITION: Overall cognitive status: Within functional limits for tasks assessed     SENSATION: Not tested  MUSCLE LENGTH: (not tested on 07/10/22) Mild restriction on B hamstrings Moderate restriction on  B gastrocsoleus  POSTURE: rounded shoulders and forward head   LOWER EXTREMITY MMT  MMT Right eval Left eval Right 07/10/22 Left 07/10/22 Right 08/06/22 Left 08/06/22  Hip flexion 4 4 4 4  4+ 4+  Hip extension 2+ (bridging) 2+  (bridging) 3+  (bridging) 3+ (bridging) 4 4  Hip abduction 4 4 4 4  4+ 4+  Hip adduction        Hip internal rotation        Hip external rotation        Knee flexion 4- 4- 4 4 4  4+  Knee extension 4- 3+ 4 4 5 5   Ankle dorsiflexion 3+ 4- 4- 4- 4 4+  Ankle plantarflexion 3+ 3+ 3+ 3+    Ankle  inversion        Ankle eversion         (Blank rows = not tested)  LOWER EXTREMITY ROM  Active ROM Right eval Left eval  Hip flexion [facility]  Hip extension    Hip abduction [facility]  Hip adduction [facility]  Hip internal rotation    Hip external rotation    Knee flexion [facility] WFL  Knee extension [facility]  Ankle dorsiflexion [facility] WFL  Ankle plantarflexion [facility] WFL  Ankle inversion    Ankle eversion     (Blank rows = not tested)  FUNCTIONAL TESTS:  5 times sit to stand: 18.88 sec (little assist from rollator) from 26.97 sec (has to hold on to rollator) 2 minute walk test: 304 ft (with rollator) from 224 ft (with a rollator) Tinetti POMA : 14 from 9 (balance + gait)  GAIT: Distance walked: 304 ft Assistive device utilized:  rollator Level of assistance: Modified independence Comments: foot clearance on B more evident, normal step length on B  VESTIBULAR ASSESSMENT  06/21/22 07/10/22  Head impulse test positive with corrective saccades, dizziness reported positive with corrective saccades, dizziness reported  VOR 1 corrective saccades, dizziness reported corrective saccades, dizziness reported  Optokinetic reflex (R/L)  impaired, dizziness reported  impaired, dizziness reported    TODAY'S TREATMENT:                                                                                                                              DATE:  09/06/22 Progress note 5 times sit to stand 18.14 sec without UE assist 2 MWT 185 ft with QC and SBA/Sup    08/21/22 Lateral step down 6 inch 2 x 10 bilateral  Lunge transitions to 8 inch box with blue foam 1 x 10 bilateral  Lunge transitions to 6 inch box with blue foam 1 x 10 bilateral  Hip hike on edge of step 2 x 10 bilateral  Palof press GTB 1 x 10 bilateral  Row GTB 1 x 10 Shoulder extension GTB 1 x 10   08/16/22 Lateral step down 6 inch 2 x 10 bilateral  Lateral lunge 2 x 10 bilateral  Mini squat with  3 way slide outs 2 x 5 bilateral  Lunge  transitions to 8 inch box with blue foam 2 x 10 bilateral    08/15/22 Gait with SPC 450 feet Lateral step down 6 inch 2 x 10 bilateral  Lateral lunge 2 x 10 bilateral   08/09/22 Gait with SPC 675 feet Standing 3 way hip GTB at knees 1 x 5 bilateral  Palof press RB 1 x 10 bilateral   08/07/22 Gait with SBQC 226 feet, SPC 450 feet Sit to stand holding yellow med ball 2 x 10  Lateral stepping 6 x 10 feet Seated piriformis stretch 2 x 20 second holds bilateral    08/06/2022 Progress note 5 times sit to stand 18.32 sec no UE assist; CGA for safety 2 min walk test with rollator 343 ft; 131 ft with QC and CGA MMT's see above  08/03/2022 Sit to stand with red ball 2 x 10 Foam balance with UE movement with dowel bilateral shoulder flexion and then trunk rotation x 8 each Foam marching 2 x 10 Tandem stance 2 x 30" each Gait training with SBQC x 225 ft; step over low hurdles and around cones     PATIENT EDUCATION:  Education details: Updated HEP. Instructed patient to perform standing exercises on with a stable support (e.g. kitchen counter) and have her husband next to her. Educated on possible anxiety related/multiple systems contributing to symptoms Person educated: Patient Education method: Explanation Education comprehension: verbalized understanding  HOME EXERCISE PROGRAM: Access Code: [email or web address] URL: [email or web address]  08/21/22 - Standing Shoulder Row with Anchored Resistance  - 1 x daily - 7 x weekly - 2 sets - 10 reps - Shoulder extension with resistance - Neutral  - 1 x daily - 7 x weekly - 2 sets - 10 reps - Standing Anti-Rotation Press with Anchored Resistance  - 1 x daily - 7 x weekly - 2 sets - 10 reps  08/16/22 - Single Leg Balance with Four Way Reach and Rotation  - 1 x daily - 7 x weekly - 2 sets - 5 reps - Forward Lunge with Back Leg Straight and Counter Support  - 1 x daily - 7 x weekly - 2 sets - 10 reps  08/15/22 - Lateral Step Down (Mirrored)  - 1 x  daily - 7 x weekly - 2 sets - 10 reps - Side Lunge with Counter Support  - 1 x daily - 7 x weekly - 2 sets - 10 reps  08/09/22 - Standing Anti-Rotation Press with Anchored Resistance  - 1 x daily - 7 x weekly - 2 sets - 10 reps  07/25/22 - Forward Step with Same Side Arm Raise  - 1 x daily - 7 x weekly - 2 sets - 10 reps  07/23/22- Supine Diaphragmatic Breathing  - 1 x daily - 7 x weekly - 10 reps - Seated Diaphragmatic Breathing  - 1 x daily - 7 x weekly - 10 reps - Step Up  - 1-2 x daily - 7 x weekly - 2 sets - 10 reps  07/17/22 - Sit to Stand with Arms Crossed  - 1 x daily - 7 x weekly - 1-2 sets - 10 reps  07/13/22: - Standing 3-Way Leg Reach with Resistance at Ankles and Counter Support  - 1 x daily - 7 x weekly - 2 sets - 10 reps  07/02/22 hip abduction, [name] bos standing and regular standing head turns and nods  06/28/2022 - Mini Squat with Counter Support  -  1-2 x daily - 5-7 x weekly - 2 sets - 10 reps - 3 hold - Standing Hip Extension with Counter Support  - 1-2 x daily - 5-7 x weekly - 2 sets - 10 reps  Date: 06/27/2022 Prepared by: Krystal Clark  Exercises - Seated Calf Stretch with Strap  - 1-2 x daily - 5-7 x weekly - 3 reps - 30 hold - Standing Tandem Balance with Counter Support  - 1-2 x daily - 5-7 x weekly - 30 hold - Heel Toe Raises with Counter Support  - 1-2 x daily - 5-7 x weekly - 2 sets - 10 reps  06/21/2022 GAZE STABILIZATION EXERCISES (2-3x/day, 5-7 days/week, seated) These exercises are designed to improve your eyes' ability to track objects without getting dizzy. When performing these exercises, make sure you are facing a blank wall (like a white wall) to avoid distraction. You can wear your glasses/contact lenses if you must.   Before anything, warm-up your neck muscles by bending it forward and back 10 times, side-to-side 10 times, and slowly turn your head left and right 10 time Exercise 1 (Horizontal Saccades) Use two cards for this exercise While  holding the cards on each hand side by side in sitting with arms outstretched, look at the first item on the other card then look at the next item on the second card then alternate your gaze sequentially. Do NOT move your cards nor the your head. Do the following for one minute each Identifying numbers Identifying shapes Exercise 2 (Paradigm x1) Use only one card for this exercise While holding the card in sitting with your arm outstretched, turn your head left and right at your own pace while keeping the card stationary.  Do this for one minute each while you are: Identifying numbers Identifying shapes Exercise 3 (VOR cancellation) Use only one card for this exercise While holding the card in sitting with your arms outstretched, turn your body, arm, and head at the same time left and right at your own pace Do this for one minute each while you are: Identifying numbers Identifying shapes  3/11/20204 - VOR x 1 Habituation Exercises 3x daily - Sit/stands w/ 4.5lb weight at home 2 x 5 daily.  ASSESSMENT:  CLINICAL IMPRESSION: Progress note today.  Good improvement with 2 MWT using QC and SBA/Supervision.  Patient has met 4/8 set rehab goals and is agreeable to discharge at this time.  Discussed at length with patient the importance of  continuing with HEP and she verbalizes understanding.  PROGRESS NOTE 07/10/22: Patient demonstrated continued improvements in function as indicated by positive significant changes in , 5TSTS, and strength. However, patient still presents with deficits in vestibular function which greatly affects her standing balance. Patient still presents as a falls risk based on the Tinetti score. With this, skilled PT is still required to address the impairments and functional limitations listed below.  Interventions today were geared towards gaze stabilization incorporated with her standing balance. Patient tolerated gaze stabilization activities with mild dizziness and  unsteadiness on the saccades, VOR 1 and cancellation exercises. Demonstrated appropriate levels of fatigue. Rest periods provided. Provided slight amount of cueing to ensure correct execution of activity with good carry-over.    OBJECTIVE IMPAIRMENTS: Abnormal gait, decreased balance, decreased coordination, decreased endurance, difficulty walking, decreased strength, impaired flexibility, and pain.   ACTIVITY LIMITATIONS: carrying, lifting, bending, standing, squatting, stairs, transfers, continence, bathing, toileting, dressing, self feeding, hygiene/grooming, and locomotion level  PARTICIPATION LIMITATIONS: meal prep, cleaning, laundry,  driving, shopping, community activity, and yard work  PERSONAL FACTORS: Fitness level, Bladder implant (will have surgery 07/26/2022), hx of B THR, lobectomy are also affecting patient's functional outcome.   REHAB POTENTIAL: Fair    CLINICAL DECISION MAKING: Evolving/moderate complexity  EVALUATION COMPLEXITY: Moderate   GOALS: Goals reviewed with patient? Yes  SHORT TERM GOALS: Target date: 07/13/2022  Pt will demonstrate indep in HEP to facilitate carry-over of skilled services and improve functional outcomes Goal status: MET  LONG TERM GOALS: Target date: 08/31/2022  Pt will be report little to no dizziness on VOR 1 and head impulse test to facilitate safety on ambulation Baseline: with dizziness Goal status: INITIAL/NEW   Pt will decrease 5TSTS by at least 5 seconds in order to demonstrate clinically significant improvement in LE strength  Baseline: 18.88 sec; 18.14 sec Goal status: MET  3.  Patient will demonstrate increase in Tinetti Score by 8 points in order to demonstrate clinically significant improvement in balance and decreased risk for falls  Baseline: 9 Goal status: IN PROGRESS  4.  Pt will increase by at least 40 ft in order to demonstrate clinically significant improvement in community ambulation Baseline: 304 ft; 343  ft with rollator 4/29 Goal status: REVISED  5.  Pt will demonstrate increase in LE strength to 4 to facilitate ease and safety in ambulation Baseline: 2+ Goal status: MET  6.  Pt will demonstrate improved LE coordination as manifested by performing 3/4 correct trials in heel-to-shin to facilitate ease in ambulation Baseline: impaired Goal status: MET  7.  Pt will be able to walk x 224 ft in 2 minutes with a quad cane Baseline: walks with a rollator Goal status: REVISED 08/06/22; 09/06/22 185 ft with QC and SBA/Supervision   PLAN:  PT FREQUENCY: 3x/week  PT DURATION: 4 weeks  PLANNED INTERVENTIONS: Therapeutic exercises, Therapeutic activity, Neuromuscular re-education, Balance training, Gait training, Patient/Family education, Self Care, and Stair training  PLAN FOR NEXT SESSION: discharge  9:40 AM, 09/06/22 [name] [name] Kyair Ditommaso MPT [place] [facility] [place] [phone]

## 2022-09-08 ENCOUNTER — Other Ambulatory Visit: Payer: Self-pay | Admitting: Allergy & Immunology

## 2022-09-10 ENCOUNTER — Other Ambulatory Visit (HOSPITAL_COMMUNITY): Payer: Self-pay

## 2022-09-10 DIAGNOSIS — I700 Atherosclerosis of aorta: Secondary | ICD-10-CM | POA: Insufficient documentation

## 2022-09-12 ENCOUNTER — Encounter: Payer: Self-pay | Admitting: Neurology

## 2022-10-01 ENCOUNTER — Encounter (HOSPITAL_COMMUNITY): Payer: Medicare Other | Admitting: Physical Therapy

## 2022-10-04 LAB — COMPREHENSIVE METABOLIC PANEL
ALT: 14 IU/L (ref 0–32)
AST: 16 IU/L (ref 0–40)
Albumin: 4.3 g/dL (ref 3.8–4.9)
Alkaline Phosphatase: 57 IU/L (ref 44–121)
BUN/Creatinine Ratio: 13 (ref 9–23)
BUN: 12 mg/dL (ref 6–24)
Bilirubin Total: 0.2 mg/dL (ref 0.0–1.2)
CO2: 26 mmol/L (ref 20–29)
Calcium: 9.4 mg/dL (ref 8.7–10.2)
Chloride: 106 mmol/L (ref 96–106)
Creatinine, Ser: 0.91 mg/dL (ref 0.57–1.00)
Globulin, Total: 1.6 g/dL (ref 1.5–4.5)
Glucose: 87 mg/dL (ref 70–99)
Potassium: 4.3 mmol/L (ref 3.5–5.2)
Sodium: 144 mmol/L (ref 134–144)
Total Protein: 5.9 g/dL — ABNORMAL LOW (ref 6.0–8.5)
eGFR: 73 mL/min/{1.73_m2} (ref >59)

## 2022-10-04 LAB — LIPID PANEL
Chol/HDL Ratio: 2.6 ratio (ref 0.0–4.4)
Cholesterol, Total: 169 mg/dL (ref 100–199)
HDL: 64 mg/dL (ref >39)
LDL Chol Calc (NIH): 82 mg/dL (ref 0–99)
Triglycerides: 135 mg/dL (ref 0–149)
VLDL Cholesterol Cal: 23 mg/dL (ref 5–40)

## 2022-10-04 LAB — TSH: TSH: 0.444 u[IU]/mL — ABNORMAL LOW (ref 0.450–4.500)

## 2022-10-04 LAB — T4, FREE: Free T4: 1.6 ng/dL (ref 0.82–1.77)

## 2022-10-10 ENCOUNTER — Ambulatory Visit (INDEPENDENT_AMBULATORY_CARE_PROVIDER_SITE_OTHER): Payer: Medicare Other | Admitting: "Endocrinology

## 2022-10-10 ENCOUNTER — Telehealth: Payer: Self-pay | Admitting: Orthopaedic Surgery

## 2022-10-10 ENCOUNTER — Encounter: Payer: Self-pay | Admitting: "Endocrinology

## 2022-10-10 VITALS — BP 102/64 | HR 84 | Ht 64.0 in | Wt 137.4 lb

## 2022-10-10 DIAGNOSIS — E782 Mixed hyperlipidemia: Secondary | ICD-10-CM | POA: Diagnosis not present

## 2022-10-10 DIAGNOSIS — R7303 Prediabetes: Secondary | ICD-10-CM | POA: Diagnosis not present

## 2022-10-10 DIAGNOSIS — E039 Hypothyroidism, unspecified: Secondary | ICD-10-CM

## 2022-10-10 DIAGNOSIS — T380X5A Adverse effect of glucocorticoids and synthetic analogues, initial encounter: Secondary | ICD-10-CM

## 2022-10-10 DIAGNOSIS — E2740 Unspecified adrenocortical insufficiency: Secondary | ICD-10-CM | POA: Diagnosis not present

## 2022-10-10 DIAGNOSIS — E785 Hyperlipidemia, unspecified: Secondary | ICD-10-CM | POA: Insufficient documentation

## 2022-10-10 LAB — POCT GLYCOSYLATED HEMOGLOBIN (HGB A1C): HbA1c, POC (controlled diabetic range): 5.6 % (ref 0.0–7.0)

## 2022-10-10 MED ORDER — LEVOTHYROXINE SODIUM 75 MCG PO TABS: 75.0000 ug | ORAL_TABLET | Freq: Every day | ORAL | 1 refills | Status: DC

## 2022-10-10 NOTE — Telephone Encounter (Signed)
Pt called stating she had 2 hip replacements 2022 and 2023 and she has been having pain in both leg and groin area. She is asking is this normal or do she need to make an appt. Pt phone number is [phone].

## 2022-10-10 NOTE — Telephone Encounter (Signed)
Hasn't been seen since 2022. Needs appt. LVM advising pt

## 2022-10-10 NOTE — Progress Notes (Signed)
10/10/2022, 9:30 AM   Endocrinology follow-up note  Subjective:    Patient ID: [name], female    DOB: Sep 10, 1965, PCP Benita Stabile, MD   Past Medical History:  Diagnosis Date   Anemia    Anxiety    Arthritis    Asthma    Cervical radiculopathy    Cluster headaches    COPD (chronic obstructive pulmonary disease) (HCC)    COPD (chronic obstructive pulmonary disease) with chronic bronchitis    GERD (gastroesophageal reflux disease)    Hyperlipemia    Hypoglycemia    Hypothyroidism    Migraine    Pneumothorax    PONV (postoperative nausea and vomiting)    Pre-diabetes    Rectal discharge 07/28/2010   Shortness of breath    Sleep apnea    cannot tolerate-not using CPAP   Sluggishness 07/28/2010   Past Surgical History:  Procedure Laterality Date   ABDOMINAL HYSTERECTOMY     with bladder suspension   CERVICAL FUSION     CHOLECYSTECTOMY     COLONOSCOPY N/A 07/30/2013   Procedure: COLONOSCOPY;  Surgeon: Malissa Hippo, MD;  Location: [facility];  Service: Endoscopy;  Laterality: N/A;  930   EXAM UNDER ANESTHESIA WITH MANIPULATION OF SHOULDER Right 10/11/2016   Procedure: EXAM UNDER ANESTHESIA WITH MANIPULATION OF SHOULDER;  Surgeon: Vickki Hearing, MD;  Location: [facility];  Service: Orthopedics;  Laterality: Right;   HERNIA REPAIR     INCISIONAL HERNIA REPAIR N/A 07/30/2016   Procedure: HERNIA REPAIR INCISIONAL WITH MESH;  Surgeon: [name], MD;  Location: [facility];  Service: General;  Laterality: N/A;   INTERSTIM IMPLANT PLACEMENT  02/02/2021   Procedure: Leane Platt IMPLANT FIRST STAGE LEFT SACRUM ;  Surgeon: Malen Gauze, MD;  Location: [facility];  Service: Urology;;   Leane Platt IMPLANT PLACEMENT N/A 02/23/2021   Procedure: Leane Platt IMPLANT SECOND STAGE;  Surgeon: Malen Gauze, MD;  Location: [facility];  Service: Urology;  Laterality: N/A;  Rep coming, time needs to stay at 2:00   JOINT  REPLACEMENT     LOBECTOMY     right lung    POSTERIOR CERVICAL LAMINECTOMY Right 04/05/2017   Procedure: Right C6-7 Posterior cervical laminectomy;  Surgeon: Donalee Citrin, MD;  [facility];  Service: Neurosurgery;  Laterality: Right;  Right C6-7 Posterior cervical laminectomy   SHOULDER ARTHROSCOPY WITH ROTATOR CUFF REPAIR Right 10/11/2016   Procedure: SHOULDER ARTHROSCOPY;  Surgeon: Vickki Hearing, MD;  Location: [facility];  Service: Orthopedics;  Laterality: Right;   TOTAL HIP ARTHROPLASTY Right 04/15/2020   Procedure: RIGHT TOTAL HIP ARTHROPLASTY ANTERIOR APPROACH;  Surgeon: Kathryne Hitch, MD;  Location: [facility];  Service: Orthopedics;  Laterality: Right;   TOTAL HIP ARTHROPLASTY Left 07/22/2020   Procedure: LEFT  HIP ARTHROPLASTY ANTERIOR APPROACH;  Surgeon: Kathryne Hitch, MD;  Location: [facility];  Service: Orthopedics;  Laterality: Left;  RNFA   TUBAL LIGATION     Social History   Socioeconomic History   Marital status: Legally Separated    Spouse name: Not on file   Number of children: Not on file   Years of education: Not on file   Highest education level: Not on file  Occupational History   Not on file  Tobacco Use   Smoking status: Former    Packs/day: 1.00    Years: 30.00    Additional pack years: 0.00    Total pack years: 30.00    Types: Cigarettes    Quit date: 02/21/2008    Years since quitting: 14.6   Smokeless tobacco: Never  Vaping Use   Vaping Use: Never used  Substance and Sexual Activity   Alcohol use: No    Alcohol/week: 0.0 standard drinks of alcohol    Comment: no   Drug use: No   Sexual activity: Not Currently    Partners: Male    Birth control/protection: Surgical  Other Topics Concern   Not on file  Social History Narrative   Are you right handed or left handed? Right handed   Are you currently employed ?    What is your current occupation?   Do you live at home alone? NO    Who lives with you? Son   What type of home do  you live in: 1 story or 2 story? 1       Social Determinants of Corporate investment banker Strain: Not on file  Food Insecurity: Not on file  Transportation Needs: Not on file  Physical Activity: Not on file  Stress: Not on file  Social Connections: Not on file   Family History  Problem Relation Age of Onset   Neuropathy Mother    Migraines Mother    Cancer Mother    COPD Mother    Diabetes Mother    Hyperlipidemia Mother    Hypertension Mother    Hyperlipidemia Father    Hypertension Father    Migraines Sister    Migraines Maternal Aunt    Stroke Maternal Grandmother    Heart failure Maternal Grandmother    Hypertension Maternal Grandmother    Thyroid disease Maternal Grandmother    Diabetes Paternal Grandmother    Alzheimer's disease Paternal Grandmother    Stroke Paternal Grandfather    Heart failure Paternal Grandfather    Hypertension Paternal Grandfather    Bipolar disorder Son    Colon cancer Neg Hx    Outpatient Encounter Medications as of 10/10/2022  Medication Sig   acetaminophen (TYLENOL) 500 MG tablet Take 1,000 mg by mouth every 6 (six) hours as needed for moderate pain.   albuterol (PROVENTIL) (2.5 MG/3ML) 0.083% nebulizer solution USE 1 VIAL IN NEBULIZER EVERY 6 HOURS AS NEEDED FOR WHEEZING OR SHORTNESS OF BREATH   albuterol (VENTOLIN HFA) 108 (90 Base) MCG/ACT inhaler Inhale 2 puffs into the lungs every 4 (four) hours as needed for shortness of breath.   Benralizumab (FASENRA PEN) 30 MG/ML SOAJ Inject 1 mL (30 mg total) into the skin every 8 (eight) weeks.   Budeson-Glycopyrrol-Formoterol (BREZTRI AEROSPHERE) 160-9-4.8 MCG/ACT AERO Inhale 2 puffs into the lungs in the morning and at bedtime.   cetirizine (ZYRTEC) 10 MG tablet Take 10 mg by mouth daily.   Cholecalciferol (VITAMIN D3 SUPER STRENGTH PO) Take 5,000 Units by mouth daily.   citalopram (CELEXA) 20 MG tablet Take 20 mg by mouth daily.   Lancets (ONETOUCH DELICA PLUS LANCET33G) MISC Apply  topically.   latanoprost (XALATAN) 0.005 % ophthalmic solution Place 1 drop into both eyes at bedtime.   levothyroxine (SYNTHROID) 75 MCG tablet Take 1 tablet (75 mcg total) by mouth daily before breakfast.   montelukast (SINGULAIR) 10 MG tablet Take 1 tablet (10 mg total) by mouth at bedtime.  pantoprazole (PROTONIX) 40 MG tablet Take 30- 60 min before your first and last meals of the day (Patient taking differently: Take 40 mg by mouth 2 (two) times daily.)   pravastatin (PRAVACHOL) 40 MG tablet Take 40 mg by mouth at bedtime.   predniSONE (DELTASONE) 5 MG tablet Take 1 tablet (5 mg total) by mouth daily with breakfast.   pregabalin (LYRICA) 75 MG capsule Take 100 mg by mouth 2 (two) times daily.   Spacer/Aero-Holding Chambers DEVI 1 each by Does not apply route daily as needed.   SUMAtriptan 6 MG/0.5ML SOAJ Inject 6 mg as directed daily as needed (migraine).    Triamcinolone Acetonide (NASACORT ALLERGY 24HR NA) Place 2 sprays into the nose daily as needed (allergies).   valACYclovir (VALTREX) 500 MG tablet Take 500-2,000 mg by mouth See admin instructions. Take 2000mg s at first sign of fever blister outbreak then take 500mg s daily until gone   [DISCONTINUED] gabapentin (NEURONTIN) 100 MG capsule Take by mouth.   [DISCONTINUED] levothyroxine (SYNTHROID) 75 MCG tablet Take 1 tablet (75 mcg total) by mouth daily before breakfast. (Patient taking differently: Take 50 mcg by mouth daily before breakfast.)   [DISCONTINUED] levothyroxine (SYNTHROID) 88 MCG tablet Take 1 tablet by mouth daily.   [DISCONTINUED] LORazepam (ATIVAN) 1 MG tablet Take 1.5 mg by mouth 2 (two) times daily.   Facility-Administered Encounter Medications as of 10/10/2022  Medication   Benralizumab SOSY 30 mg   ALLERGIES: Allergies  Allergen Reactions   Aspirin Shortness Of Breath    Causes asthma flares    Penicillins Other (See Comments)    UNSPECIFIED REACTION OF CHILDHOOD  Has patient had a PCN reaction causing  immediate rash, facial/tongue/throat swelling, SOB or lightheadedness with hypotension: NO Has patient had a PCN reaction causing severe rash involving mucus membranes or skin necrosis: NO Has patient had a PCN reaction that required hospitalization: no Has patient had a PCN reaction occurring within the last 10 years: NO If all of the above answers are "NO", then may proceed with Cephalosporin use.    Vicodin [Hydrocodone-Acetaminophen] Other (See Comments)    Headaches     VACCINATION STATUS: Immunization History  Administered Date(s) Administered   Influenza,inj,Quad PF,6+ Mos 04/13/2021   Influenza,inj,Quad PF,6-35 Mos 01/28/2020   Influenza,inj,quad, With Preservative 02/07/2022   Influenza-Unspecified 02/15/2017, 04/13/2019, 04/13/2021   PFIZER(Purple Top)SARS-COV-2 Vaccination 06/21/2019, 07/22/2019, 03/09/2020   PNEUMOCOCCAL CONJUGATE-20 04/13/2021    HPI [name] is 57 y.o. female who presents today with a medical history as above. she is being seen in follow-up after she was seen in consultation for steroid induced adrenal suppression requested by Benita Stabile, MD.   See notes from her previous visit.  She has been treated with prednisone more or less continuously for the last 15 years.  There are times she was taking 20 mg of prednisone daily, mostly above 10mg  daily.   After diagnosis with adrenal insufficiency, she was kept on prednisone 5 mg p.o. daily since last visit.  She has no new complaints today. She denies any history of adrenal crisis.  She has history of heavy smoking, complicated by COPD on ongoing inhalers including albuterol, budesonide, and Perforomist.  She is also on oxygen supplement 24/7. She denies any history of adrenal injury, penetrating abdominal trauma.  Her other medical problems include hypothyroidism on levothyroxine 88 mcg p.o. daily.  Her levothyroxine was increased to 88 mcg in the interim, now presents with labs consistent with  overtreatment.  She also has hyperlipidemia  and prediabetes.   Her previsit thyroid function tests are consistent with appropriate repla  Her most recent bone density from March 25, 2019 was consistent with osteopenia of the spine, normal bone density on the left forearm radius.    Review of Systems  Constitutional: + Gained 7 pounds since last visit,   + fatigue, no subjective hyperthermia, no subjective hypothermia Eyes: no blurry vision, no xerophthalmia  Respiratory: + On continuous oxygen supplement.   Status post partial pneumonectomy in 2004.   Objective:       10/10/2022    8:51 AM 07/09/2022    1:32 PM 06/06/2022   10:51 AM  Vitals with BMI  Height 5\' 4"  5\' 4"  5\' 4"   Weight 137 lbs 6 oz 142 lbs 143 lbs 6 oz  BMI 23.57 24.36 24.6  Systolic 102 113 782  Diastolic 64 66 62  Pulse 84 77 87    BP 102/64   Pulse 84   Ht 5\' 4"  (1.626 m)   Wt 137 lb 6.4 oz (62.3 kg)   BMI 23.58 kg/m   Wt Readings from Last 3 Encounters:  10/10/22 137 lb 6.4 oz (62.3 kg)  07/09/22 142 lb (64.4 kg)  06/06/22 143 lb 6.4 oz (65 kg)    Physical Exam  Constitutional:  Body mass index is 23.58 kg/m.,  not in acute distress, normal state of mind Eyes: PERRLA, EOMI, no exophthalmos ENT: moist mucous membranes, no gross thyromegaly, no gross cervical lymphadenopathy   CMP ( most recent) CMP     Component Value Date/Time   NA 144 10/03/2022 0801   K 4.3 10/03/2022 0801   CL 106 10/03/2022 0801   CO2 26 10/03/2022 0801   GLUCOSE 87 10/03/2022 0801   GLUCOSE 104 (H) 10/30/2021 0958   BUN 12 10/03/2022 0801   CREATININE 0.91 10/03/2022 0801   CALCIUM 9.4 10/03/2022 0801   PROT 5.9 (L) 10/03/2022 0801   ALBUMIN 4.3 10/03/2022 0801   AST 16 10/03/2022 0801   ALT 14 10/03/2022 0801   ALKPHOS 57 10/03/2022 0801   BILITOT 0.2 10/03/2022 0801   GFRNONAA >60 10/30/2021 0958   GFRAA >60 03/06/2019 0931     Diabetic Labs (most recent): Lab Results  Component Value Date   HGBA1C 5.6  10/10/2022   HGBA1C 5.8 04/11/2022   HGBA1C 5.8 02/03/2022     Lab Results  Component Value Date   TSH 0.444 (L) 10/03/2022   TSH 0.129 (L) 04/05/2022   TSH 0.55 02/03/2022   TSH 7.410 (H) 09/25/2021   TSH 3.21 05/21/2006   FREET4 1.60 10/03/2022   FREET4 1.48 04/05/2022   FREET4 1.03 09/25/2021     Lipid Panel     Component Value Date/Time   CHOL 169 10/03/2022 0801   TRIG 135 10/03/2022 0801   HDL 64 10/03/2022 0801   CHOLHDL 2.6 10/03/2022 0801   LDLCALC 82 10/03/2022 0801   LABVLDL 23 10/03/2022 0801    Assessment & Plan:   1.  Steroid induced adrenal suppression   2.  Hypothyroidism   3.  Prediabetes   - I have reviewed her recent and available endocrine records and clinically evaluated the patient. - Based on these reviews, she has adrenal suppression due to chronic steroid treatment related to her COPD/asthma.  This patient will likely need lifelong replacement dose steroids, glucocorticoids for now.  She is advised to continue prednisone 5 mg p.o. daily at breakfast.      The minimum amount of prednisone she  should be on is 5 mg p.o. daily.  Her blood pressure is stable at 102/64 today, if she develops hypotension, she will be considered for Florinef on subsequent visits.    It is unsafe, unnecessary to withdraw prednisone to test her for adrenal insufficiency.  -She wears a medical alert for  depicting her adrenal insufficiency and the need for steroids.   She has point-of-care A1c is at 5.6% today, improving from 5.     She will not need medication intervention for this at this time.     Regarding her hypothyroidism, the circumstance of diagnosis not available to review.  Her previsit labs show evidence of slight over-replacement.  I discussed and lowered her levothyroxine to 75 mg p.o. daily before breakfast.    - We discussed about the correct intake of her thyroid hormone, on empty stomach at fasting, with water, separated by at least 30 minutes from  breakfast and other medications,  and separated by more than 4 hours from calcium, iron, multivitamins, acid reflux medications (PPIs). -Patient is made aware of the fact that thyroid hormone replacement is needed for life, dose to be adjusted by periodic monitoring of thyroid function tests.   Regarding her dyslipidemia: She presents with improved lipid panel with LDL improving to 82 overall improving from 115.  She will continue to benefit from statin intervention.  I advised her to continue Pravachol 40 mg p.o. nightly.  Side effects and precautions discussed with her.     - she is advised to maintain close follow up with Benita Stabile, MD for primary care needs.   I spent  22  minutes in the care of the patient today including review of labs from Thyroid Function, CMP, and other relevant labs ; imaging/biopsy records (current and previous including abstractions from other facilities); face-to-face time discussing  her lab results and symptoms, medications doses, her options of short and long term treatment based on the latest standards of care / guidelines;   and documenting the encounter.  Loreta Ave  participated in the discussions, expressed understanding, and voiced agreement with the above plans.  All questions were answered to her satisfaction. she is encouraged to contact clinic should she have any questions or concerns prior to her return visit.     Follow up plan: Return in about 6 months (around 04/12/2023) for F/U with Pre-visit Labs, A1c -NV.   Marquis Lunch, MD [facility] [address] [place], [place] [address] Phone: [phone]  Fax: [phone]     10/10/2022, 9:30 AM  This note was partially dictated with voice recognition software. Similar sounding words can be transcribed inadequately or may not  be corrected upon review.

## 2022-10-22 ENCOUNTER — Encounter: Payer: Self-pay | Admitting: Physician Assistant

## 2022-10-22 ENCOUNTER — Ambulatory Visit (HOSPITAL_COMMUNITY)
Admission: RE | Admit: 2022-10-22 | Discharge: 2022-10-22 | Disposition: A | Discharge status: Home / Self Care | Payer: Medicare Other | Source: Ambulatory Visit | Attending: Internal Medicine | Admitting: Internal Medicine

## 2022-10-22 ENCOUNTER — Encounter (HOSPITAL_COMMUNITY): Payer: Self-pay

## 2022-10-22 ENCOUNTER — Other Ambulatory Visit (INDEPENDENT_AMBULATORY_CARE_PROVIDER_SITE_OTHER): Payer: Medicare Other

## 2022-10-22 ENCOUNTER — Ambulatory Visit: Payer: Medicare Other | Admitting: Physician Assistant

## 2022-10-22 DIAGNOSIS — Z96641 Presence of right artificial hip joint: Secondary | ICD-10-CM | POA: Diagnosis not present

## 2022-10-22 DIAGNOSIS — Z96642 Presence of left artificial hip joint: Secondary | ICD-10-CM

## 2022-10-22 DIAGNOSIS — Z1231 Encounter for screening mammogram for malignant neoplasm of breast: Secondary | ICD-10-CM | POA: Insufficient documentation

## 2022-10-22 NOTE — Progress Notes (Signed)
HPI: [name] comes in today with bilateral hip pain.  She states that 4 months ago she started doing exercises for lower extremity weakness bilaterally.  This was while she was going to therapy for gait and balance issues due to her vertigo.  She has developed groin pain right greater than left.  She states that over the last 2 weeks she has been doing the exercises every other day and taking ibuprofen she has had decreased pain in both hips.  Ranks her pain in the right hip at worst to be 8 out of 10 in her left hip to be 5 out of 10 denies any numbness tingling down either leg.  She does have some burning in the lateral aspect of her right thigh but this is been helped some with Lyrica.  History of left total hip arthroplasty 07/22/2020 and right total hip arthroplasty 04/15/2020.  No fevers chills.  Denies any redness around either incision.  Review of systems see HPI  Physical exam: General well-developed well-nourished female who ambulates with a cane.  She is on chronic oxygen.  No acute distress. Bilateral hips excellent range of motion both hips without pain.  She has tenderness over the trochanteric region bilaterally.  With extremes internal/external rotation she has groin pain bilaterally.  Radiographs: [facility] pelvis lateral view both hips: Hips are well located.  Arthroplasty components well-seated bilaterally.  No acute fractures.  No acute findings on her hip.  Impression: Bilateral hip pain  Plan: She has upcoming surgery for bladder stimulator replacement therefore she cannot go back to exercise immediately after having this replaced.  She cannot go on NSAIDs.  Once she able to return to exercise and like for her to go to every other day doing her leg strengthening exercises and take Aleve 2 tablets in the morning 2 tablets in the evening.  If she continues to have pain after we complete this despite having rested for a few weeks prior would recommend MRI to evaluate her bilateral hip pain.   Questions were encouraged and answered at length

## 2022-10-22 NOTE — Patient Instructions (Signed)
[name]  10/22/2022     @PREFPERIOPPHARMACY @   Your procedure is scheduled on  10/25/2022.   Report to [facility] at  [phone]  A.M.   Call this number if you have problems the morning of surgery:  [phone]  If you experience any cold or flu symptoms such as cough, fever, chills, shortness of breath, etc. between now and your scheduled surgery, please notify us at the above number.   Remember:  Do not eat or drink after midnight.        Use your nebulizer and your inhaler before you come and bring your rescue inhaler with you.     DO NOT take any medications for diabetes the morning of your procedure.      Take these medicines the morning of surgery with A SIP OF WATER            zyrtec, celexa, levothyroxine, pantoprazole, deltasone, pregabalin, sumatriptan.     Do not wear jewelry, make-up or nail polish, including gel polish,  artificial nails, or any other type of covering on natural nails (fingers and  toes).  Do not wear lotions, powders, or perfumes, or deodorant.  Do not shave 48 hours prior to surgery.  Men may shave face and neck.  Do not bring valuables to the hospital.  [facility] is not responsible for any belongings or valuables.  Contacts, dentures or bridgework may not be worn into surgery.  Leave your suitcase in the car.  After surgery it may be brought to your room.  For patients admitted to the hospital, discharge time will be determined by your treatment team.  Patients discharged the day of surgery will not be allowed to drive home and must have someone with them for 24 hours.    Special instructions:   DO NOT smoke tobacco or vape for 24 hours before your procedure.  Please read over the following fact sheets that you were given. Coughing and Deep Breathing, Surgical Site Infection Prevention, Anesthesia Post-op Instructions, and Care and Recovery After Surgery      General Anesthesia, Adult, Care After The following  information offers guidance on how to care for yourself after your procedure. Your health care provider may also give you more specific instructions. If you have problems or questions, contact your health care provider. What can I expect after the procedure? After the procedure, it is common for people to: Have pain or discomfort at the IV site. Have nausea or vomiting. Have a sore throat or hoarseness. Have trouble concentrating. Feel cold or chills. Feel weak, sleepy, or tired (fatigue). Have soreness and body aches. These can affect parts of the body that were not involved in surgery. Follow these instructions at home: For the time period you were told by your health care provider:  Rest. Do not participate in activities where you could fall or become injured. Do not drive or use machinery. Do not drink alcohol. Do not take sleeping pills or medicines that cause drowsiness. Do not make important decisions or sign legal documents. Do not take care of children on your own. General instructions Drink enough fluid to keep your urine pale yellow. If you have sleep apnea, surgery and certain medicines can increase your risk for breathing problems. Follow instructions from your health care provider about wearing your sleep device: Anytime you are sleeping, including during daytime naps. While taking prescription pain medicines, sleeping medicines, or medicines that make you drowsy. Return  to your normal activities as told by your health care provider. Ask your health care provider what activities are safe for you. Take over-the-counter and prescription medicines only as told by your health care provider. Do not use any products that contain nicotine or tobacco. These products include cigarettes, chewing tobacco, and vaping devices, such as e-cigarettes. These can delay incision healing after surgery. If you need help quitting, ask your health care provider. Contact a health care provider  if: You have nausea or vomiting that does not get better with medicine. You vomit every time you eat or drink. You have pain that does not get better with medicine. You cannot urinate or have bloody urine. You develop a skin rash. You have a fever. Get help right away if: You have trouble breathing. You have chest pain. You vomit blood. These symptoms may be an emergency. Get help right away. Call 911. Do not wait to see if the symptoms will go away. Do not drive yourself to the hospital. Summary After the procedure, it is common to have a sore throat, hoarseness, nausea, vomiting, or to feel weak, sleepy, or fatigue. For the time period you were told by your health care provider, do not drive or use machinery. Get help right away if you have difficulty breathing, have chest pain, or vomit blood. These symptoms may be an emergency. This information is not intended to replace advice given to you by your health care provider. Make sure you discuss any questions you have with your health care provider. Document Revised: 06/23/2021 Document Reviewed: 06/23/2021 Elsevier Patient Education  2024 [employer] How to Use Chlorhexidine Before Surgery Chlorhexidine gluconate (CHG) is a germ-killing (antiseptic) solution that is used to clean the skin. It can get rid of the bacteria that normally live on the skin and can keep them away for about 24 hours. To clean your skin with CHG, you may be given: A CHG solution to use in the shower or as part of a sponge bath. A prepackaged cloth that contains CHG. Cleaning your skin with CHG may help lower the risk for infection: While you are staying in the intensive care unit of the hospital. If you have a vascular access, such as a central line, to provide short-term or long-term access to your veins. If you have a catheter to drain urine from your bladder. If you are on a ventilator. A ventilator is a machine that helps you breathe by moving air in and  out of your lungs. After surgery. What are the risks? Risks of using CHG include: A skin reaction. Hearing loss, if CHG gets in your ears and you have a perforated eardrum. Eye injury, if CHG gets in your eyes and is not rinsed out. The CHG product catching fire. Make sure that you avoid smoking and flames after applying CHG to your skin. Do not use CHG: If you have a chlorhexidine allergy or have previously reacted to chlorhexidine. On babies younger than 36 months of age. How to use CHG solution Use CHG only as told by your health care provider, and follow the instructions on the label. Use the full amount of CHG as directed. Usually, this is one bottle. During a shower Follow these steps when using CHG solution during a shower (unless your health care provider gives you different instructions): Start the shower. Use your normal soap and shampoo to wash your face and hair. Turn off the shower or move out of the shower stream. Pour the CHG  onto a clean washcloth. Do not use any type of brush or rough-edged sponge. Starting at your neck, lather your body down to your toes. Make sure you follow these instructions: If you will be having surgery, pay special attention to the part of your body where you will be having surgery. Scrub this area for at least 1 minute. Do not use CHG on your head or face. If the solution gets into your ears or eyes, rinse them well with water. Avoid your genital area. Avoid any areas of skin that have broken skin, cuts, or scrapes. Scrub your back and under your arms. Make sure to wash skin folds. Let the lather sit on your skin for 1-2 minutes or as long as told by your health care provider. Thoroughly rinse your entire body in the shower. Make sure that all body creases and crevices are rinsed well. Dry off with a clean towel. Do not put any substances on your body afterward--such as powder, lotion, or perfume--unless you are told to do so by your health care  provider. Only use lotions that are recommended by the manufacturer. Put on clean clothes or pajamas. If it is the night before your surgery, sleep in clean sheets.  During a sponge bath Follow these steps when using CHG solution during a sponge bath (unless your health care provider gives you different instructions): Use your normal soap and shampoo to wash your face and hair. Pour the CHG onto a clean washcloth. Starting at your neck, lather your body down to your toes. Make sure you follow these instructions: If you will be having surgery, pay special attention to the part of your body where you will be having surgery. Scrub this area for at least 1 minute. Do not use CHG on your head or face. If the solution gets into your ears or eyes, rinse them well with water. Avoid your genital area. Avoid any areas of skin that have broken skin, cuts, or scrapes. Scrub your back and under your arms. Make sure to wash skin folds. Let the lather sit on your skin for 1-2 minutes or as long as told by your health care provider. Using a different clean, wet washcloth, thoroughly rinse your entire body. Make sure that all body creases and crevices are rinsed well. Dry off with a clean towel. Do not put any substances on your body afterward--such as powder, lotion, or perfume--unless you are told to do so by your health care provider. Only use lotions that are recommended by the manufacturer. Put on clean clothes or pajamas. If it is the night before your surgery, sleep in clean sheets. How to use CHG prepackaged cloths Only use CHG cloths as told by your health care provider, and follow the instructions on the label. Use the CHG cloth on clean, dry skin. Do not use the CHG cloth on your head or face unless your health care provider tells you to. When washing with the CHG cloth: Avoid your genital area. Avoid any areas of skin that have broken skin, cuts, or scrapes. Before surgery Follow these steps  when using a CHG cloth to clean before surgery (unless your health care provider gives you different instructions): Using the CHG cloth, vigorously scrub the part of your body where you will be having surgery. Scrub using a back-and-forth motion for 3 minutes. The area on your body should be completely wet with CHG when you are done scrubbing. Do not rinse. Discard the cloth and let the area  air-dry. Do not put any substances on the area afterward, such as powder, lotion, or perfume. Put on clean clothes or pajamas. If it is the night before your surgery, sleep in clean sheets.  For general bathing Follow these steps when using CHG cloths for general bathing (unless your health care provider gives you different instructions). Use a separate CHG cloth for each area of your body. Make sure you wash between any folds of skin and between your fingers and toes. Wash your body in the following order, switching to a new cloth after each step: The front of your neck, shoulders, and chest. Both of your arms, under your arms, and your hands. Your stomach and groin area, avoiding the genitals. Your right leg and foot. Your left leg and foot. The back of your neck, your back, and your buttocks. Do not rinse. Discard the cloth and let the area air-dry. Do not put any substances on your body afterward--such as powder, lotion, or perfume--unless you are told to do so by your health care provider. Only use lotions that are recommended by the manufacturer. Put on clean clothes or pajamas. Contact a health care provider if: Your skin gets irritated after scrubbing. You have questions about using your solution or cloth. You swallow any chlorhexidine. Call your local poison control center [phone] [phone] in the [place] Get help right away if: Your eyes itch badly, or they become very red or swollen. Your skin itches badly and is red or swollen. Your hearing changes. You have trouble seeing. You have swelling or  tingling in your mouth or throat. You have trouble breathing. These symptoms may represent a serious problem that is an emergency. Do not wait to see if the symptoms will go away. Get medical help right away. Call your local emergency services (911 in the [place] Do not drive yourself to the hospital. Summary Chlorhexidine gluconate (CHG) is a germ-killing (antiseptic) solution that is used to clean the skin. Cleaning your skin with CHG may help to lower your risk for infection. You may be given CHG to use for bathing. It may be in a bottle or in a prepackaged cloth to use on your skin. Carefully follow your health care provider's instructions and the instructions on the product label. Do not use CHG if you have a chlorhexidine allergy. Contact your health care provider if your skin gets irritated after scrubbing. This information is not intended to replace advice given to you by your health care provider. Make sure you discuss any questions you have with your health care provider. Document Revised: 07/24/2021 Document Reviewed: 06/06/2020 Elsevier Patient Education  2023 [employer].

## 2022-10-23 ENCOUNTER — Other Ambulatory Visit: Payer: Self-pay

## 2022-10-23 ENCOUNTER — Encounter (HOSPITAL_COMMUNITY)
Admission: RE | Admit: 2022-10-23 | Discharge: 2022-10-23 | Disposition: A | Discharge status: Home / Self Care | Payer: Medicare Other | Source: Ambulatory Visit | Attending: Urology | Admitting: Urology

## 2022-10-23 ENCOUNTER — Encounter (HOSPITAL_COMMUNITY): Payer: Self-pay

## 2022-10-23 VITALS — BP 102/64 | HR 84 | Temp 97.8°F | Resp 18 | Ht 64.0 in | Wt 137.4 lb

## 2022-10-23 DIAGNOSIS — R7303 Prediabetes: Secondary | ICD-10-CM | POA: Insufficient documentation

## 2022-10-23 DIAGNOSIS — Z01810 Encounter for preprocedural cardiovascular examination: Secondary | ICD-10-CM | POA: Diagnosis present

## 2022-10-24 MED ORDER — GENTAMICIN SULFATE 40 MG/ML IJ SOLN
5.0000 mg/kg | INTRAVENOUS | Status: AC
  Administered 2022-10-25: 310 mg via INTRAVENOUS
  Filled 2022-10-24: qty 7.4

## 2022-10-25 ENCOUNTER — Other Ambulatory Visit: Payer: Self-pay

## 2022-10-25 ENCOUNTER — Encounter (HOSPITAL_COMMUNITY): Payer: Self-pay | Admitting: Urology

## 2022-10-25 ENCOUNTER — Ambulatory Visit (HOSPITAL_COMMUNITY)
Admission: RE | Admit: 2022-10-25 | Discharge: 2022-10-25 | Disposition: A | Discharge status: Home / Self Care | Payer: Medicare Other | Source: Ambulatory Visit | Attending: Urology | Admitting: Urology

## 2022-10-25 ENCOUNTER — Ambulatory Visit (HOSPITAL_BASED_OUTPATIENT_CLINIC_OR_DEPARTMENT_OTHER): Payer: Medicare Other | Admitting: Anesthesiology

## 2022-10-25 ENCOUNTER — Ambulatory Visit (HOSPITAL_COMMUNITY): Payer: Medicare Other

## 2022-10-25 ENCOUNTER — Encounter (HOSPITAL_COMMUNITY)
Admission: RE | Disposition: A | Discharge status: Home / Self Care | Payer: Self-pay | Source: Ambulatory Visit | Attending: Urology

## 2022-10-25 ENCOUNTER — Ambulatory Visit (HOSPITAL_COMMUNITY): Payer: Medicare Other | Admitting: Anesthesiology

## 2022-10-25 DIAGNOSIS — G4730 Sleep apnea, unspecified: Secondary | ICD-10-CM | POA: Diagnosis not present

## 2022-10-25 DIAGNOSIS — F419 Anxiety disorder, unspecified: Secondary | ICD-10-CM | POA: Insufficient documentation

## 2022-10-25 DIAGNOSIS — T85193A Other mechanical complication of implanted electronic neurostimulator, generator, initial encounter: Secondary | ICD-10-CM | POA: Diagnosis not present

## 2022-10-25 DIAGNOSIS — J449 Chronic obstructive pulmonary disease, unspecified: Secondary | ICD-10-CM | POA: Insufficient documentation

## 2022-10-25 DIAGNOSIS — R519 Headache, unspecified: Secondary | ICD-10-CM | POA: Diagnosis not present

## 2022-10-25 DIAGNOSIS — N3281 Overactive bladder: Secondary | ICD-10-CM | POA: Diagnosis not present

## 2022-10-25 DIAGNOSIS — Z79899 Other long term (current) drug therapy: Secondary | ICD-10-CM | POA: Diagnosis not present

## 2022-10-25 DIAGNOSIS — D649 Anemia, unspecified: Secondary | ICD-10-CM | POA: Insufficient documentation

## 2022-10-25 DIAGNOSIS — E039 Hypothyroidism, unspecified: Secondary | ICD-10-CM | POA: Insufficient documentation

## 2022-10-25 DIAGNOSIS — R0602 Shortness of breath: Secondary | ICD-10-CM | POA: Diagnosis not present

## 2022-10-25 DIAGNOSIS — Z87891 Personal history of nicotine dependence: Secondary | ICD-10-CM | POA: Insufficient documentation

## 2022-10-25 DIAGNOSIS — T839XXA Unspecified complication of genitourinary prosthetic device, implant and graft, initial encounter: Secondary | ICD-10-CM | POA: Diagnosis not present

## 2022-10-25 DIAGNOSIS — K219 Gastro-esophageal reflux disease without esophagitis: Secondary | ICD-10-CM | POA: Insufficient documentation

## 2022-10-25 DIAGNOSIS — R32 Unspecified urinary incontinence: Secondary | ICD-10-CM | POA: Diagnosis not present

## 2022-10-25 DIAGNOSIS — M1990 Unspecified osteoarthritis, unspecified site: Secondary | ICD-10-CM | POA: Diagnosis not present

## 2022-10-25 DIAGNOSIS — Z7952 Long term (current) use of systemic steroids: Secondary | ICD-10-CM | POA: Diagnosis not present

## 2022-10-25 DIAGNOSIS — T85890A Other specified complication of nervous system prosthetic devices, implants and grafts, initial encounter: Secondary | ICD-10-CM | POA: Diagnosis not present

## 2022-10-25 DIAGNOSIS — X58XXXA Exposure to other specified factors, initial encounter: Secondary | ICD-10-CM | POA: Insufficient documentation

## 2022-10-25 DIAGNOSIS — Z9981 Dependence on supplemental oxygen: Secondary | ICD-10-CM | POA: Diagnosis not present

## 2022-10-25 HISTORY — PX: INTERSTIM IMPLANT REVISION: SHX5138

## 2022-10-25 LAB — GLUCOSE, CAPILLARY: Glucose-Capillary: 90 mg/dL (ref 70–99)

## 2022-10-25 SURGERY — REVISION, SACRAL NERVE STIMULATOR, INTERSTIM
Anesthesia: Monitor Anesthesia Care | Site: Back

## 2022-10-25 MED ORDER — FENTANYL CITRATE (PF) 100 MCG/2ML IJ SOLN
INTRAMUSCULAR | Status: AC
  Filled 2022-10-25: qty 2

## 2022-10-25 MED ORDER — PROPOFOL 500 MG/50ML IV EMUL
INTRAVENOUS | Status: DC | PRN
  Administered 2022-10-25: 100 ug/kg/min via INTRAVENOUS

## 2022-10-25 MED ORDER — PROPOFOL 500 MG/50ML IV EMUL
INTRAVENOUS | Status: AC
  Filled 2022-10-25: qty 50

## 2022-10-25 MED ORDER — PHENYLEPHRINE HCL (PRESSORS) 10 MG/ML IV SOLN
INTRAVENOUS | Status: DC | PRN
  Administered 2022-10-25: 80 ug via INTRAVENOUS

## 2022-10-25 MED ORDER — LIDOCAINE-EPINEPHRINE (PF) 1 %-1:200000 IJ SOLN
INTRAMUSCULAR | Status: DC | PRN
  Administered 2022-10-25: 28 mL

## 2022-10-25 MED ORDER — OXYCODONE-ACETAMINOPHEN 5-325 MG PO TABS: 1.0000 | ORAL_TABLET | ORAL | 0 refills | Status: DC | PRN

## 2022-10-25 MED ORDER — WATER FOR IRRIGATION, STERILE IR SOLN
Status: DC | PRN
  Administered 2022-10-25: 1000 mL

## 2022-10-25 MED ORDER — LIDOCAINE HCL (CARDIAC) PF 100 MG/5ML IV SOSY
PREFILLED_SYRINGE | INTRAVENOUS | Status: DC | PRN
  Administered 2022-10-25: 60 mg via INTRAVENOUS

## 2022-10-25 MED ORDER — LIDOCAINE-EPINEPHRINE (PF) 1 %-1:200000 IJ SOLN
INTRAMUSCULAR | Status: AC
  Filled 2022-10-25: qty 30

## 2022-10-25 MED ORDER — LACTATED RINGERS IV SOLN: INTRAVENOUS | Status: DC

## 2022-10-25 MED ORDER — MEPERIDINE HCL 50 MG/ML IJ SOLN: 6.2500 mg | INTRAMUSCULAR | Status: DC | PRN

## 2022-10-25 MED ORDER — ACETAMINOPHEN 10 MG/ML IV SOLN
1000.0000 mg | Freq: Once | INTRAVENOUS | Status: AC
  Administered 2022-10-25: 1000 mg via INTRAVENOUS

## 2022-10-25 MED ORDER — ACETAMINOPHEN 10 MG/ML IV SOLN
INTRAVENOUS | Status: AC
  Filled 2022-10-25: qty 100

## 2022-10-25 MED ORDER — ONDANSETRON HCL 4 MG/2ML IJ SOLN: 4.0000 mg | Freq: Once | INTRAMUSCULAR | Status: DC | PRN

## 2022-10-25 MED ORDER — SULFAMETHOXAZOLE-TRIMETHOPRIM 800-160 MG PO TABS: 1.0000 | ORAL_TABLET | Freq: Two times a day (BID) | ORAL | 0 refills | Status: DC

## 2022-10-25 MED ORDER — DEXAMETHASONE SODIUM PHOSPHATE 10 MG/ML IJ SOLN
INTRAMUSCULAR | Status: AC
  Filled 2022-10-25: qty 1

## 2022-10-25 MED ORDER — ORAL CARE MOUTH RINSE: 15.0000 mL | Freq: Once | OROMUCOSAL | Status: AC

## 2022-10-25 MED ORDER — DEXMEDETOMIDINE HCL IN NACL 80 MCG/20ML IV SOLN
INTRAVENOUS | Status: DC | PRN
  Administered 2022-10-25: 8 ug via INTRAVENOUS
  Administered 2022-10-25: 4 ug via INTRAVENOUS

## 2022-10-25 MED ORDER — ONDANSETRON HCL 4 MG/2ML IJ SOLN
INTRAMUSCULAR | Status: DC | PRN
  Administered 2022-10-25: 4 mg via INTRAVENOUS

## 2022-10-25 MED ORDER — FENTANYL CITRATE (PF) 100 MCG/2ML IJ SOLN
INTRAMUSCULAR | Status: DC | PRN
  Administered 2022-10-25 (×2): 25 ug via INTRAVENOUS

## 2022-10-25 MED ORDER — CHLORHEXIDINE GLUCONATE 0.12 % MT SOLN
15.0000 mL | Freq: Once | OROMUCOSAL | Status: AC
  Administered 2022-10-25: 15 mL via OROMUCOSAL

## 2022-10-25 MED ORDER — HYDROMORPHONE HCL 1 MG/ML IJ SOLN: 0.2500 mg | INTRAMUSCULAR | Status: DC | PRN

## 2022-10-25 MED ORDER — PROPOFOL 10 MG/ML IV BOLUS
INTRAVENOUS | Status: DC | PRN
  Administered 2022-10-25: 20 mg via INTRAVENOUS

## 2022-10-25 SURGICAL SUPPLY — 49 items
ADH SKN CLS APL DERMABOND .7 (GAUZE/BANDAGES/DRESSINGS) ×1
ANTNA NRSTM XTRN TELEM NS LF (UROLOGICAL SUPPLIES)
APL PRP STRL LF DISP 70% ISPRP (MISCELLANEOUS) ×1
CABLE EXTEN 4.32 INTERSTIM (NEUROSURGERY SUPPLIES) ×1 IMPLANT
CHLORAPREP W/TINT 26 (MISCELLANEOUS) ×1 IMPLANT
COVER LIGHT HANDLE STERIS (MISCELLANEOUS) ×2 IMPLANT
COVER PROBE CYLINDRICAL 5X96 (MISCELLANEOUS) IMPLANT
DERMABOND ADVANCED .7 DNX12 (GAUZE/BANDAGES/DRESSINGS) ×1 IMPLANT
DRAPE C-ARMOR (DRAPES) ×1 IMPLANT
DRAPE INCISE IOBAN 66X45 STRL (DRAPES) ×1 IMPLANT
DRAPE LAPAROSCOPIC ABDOMINAL (DRAPES) ×1 IMPLANT
DRSG TEGADERM 2-3/8X2-3/4 SM (GAUZE/BANDAGES/DRESSINGS) IMPLANT
DRSG TEGADERM 4X4.75 (GAUZE/BANDAGES/DRESSINGS) ×1 IMPLANT
DRSG TELFA 3X8 NADH (GAUZE/BANDAGES/DRESSINGS) ×1 IMPLANT
ELECT REM PT RETURN 9FT ADLT (ELECTROSURGICAL) ×1
ELECTRODE REM PT RTRN 9FT ADLT (ELECTROSURGICAL) ×1 IMPLANT
ENVELOPE ABSORB ANTIBACTERIAL (Mesh General) ×1 IMPLANT
GLOVE BIO SURGEON STRL SZ8 (GLOVE) ×1 IMPLANT
GLOVE BIOGEL PI IND STRL 7.0 (GLOVE) ×2 IMPLANT
GOWN STRL REUS W/TWL LRG LVL3 (GOWN DISPOSABLE) ×1 IMPLANT
GOWN STRL REUS W/TWL XL LVL3 (GOWN DISPOSABLE) ×1 IMPLANT
KIT HANDSET INTERSTIM COMM (NEUROSURGERY SUPPLIES) IMPLANT
KIT TURNOVER CYSTO (KITS) ×1 IMPLANT
LEAD INTERSTIM 4.32 28 L (Lead) ×1 IMPLANT
MANIFOLD NEPTUNE II (INSTRUMENTS) IMPLANT
NDL HYPO 21X1.5 SAFETY (NEEDLE) ×1 IMPLANT
NEEDLE HYPO 21X1.5 SAFETY (NEEDLE) ×1 IMPLANT
NEUROSTIMULATOR 1.7X2X.06 (UROLOGICAL SUPPLIES) ×1 IMPLANT
PACK MINOR (CUSTOM PROCEDURE TRAY) ×1 IMPLANT
PAD ARMBOARD 7.5X6 YLW CONV (MISCELLANEOUS) ×1 IMPLANT
PAD DRESSING TELFA 3X8 NADH (GAUZE/BANDAGES/DRESSINGS) ×1 IMPLANT
POSITIONER HEAD 8X9X4 ADT (SOFTGOODS) ×1 IMPLANT
POUCH TYRX ANTIBAC NEURO MED (Mesh General) IMPLANT
PROGRAMMER ANTENNA EXT (UROLOGICAL SUPPLIES) IMPLANT
PROGRAMMER STIMUL 2.2X1.1X3.7 (UROLOGICAL SUPPLIES) IMPLANT
SET BASIN LINEN APH (SET/KITS/TRAYS/PACK) ×1 IMPLANT
SPONGE GAUZE 2X2 8PLY STRL LF (GAUZE/BANDAGES/DRESSINGS) ×4 IMPLANT
STIMULATOR INTERSTIM 2X1.7X.3 (Miscellaneous) IMPLANT
STRIP CLOSURE SKIN 1/2X4 (GAUZE/BANDAGES/DRESSINGS) IMPLANT
SUT MNCRL AB 4-0 PS2 18 (SUTURE) ×1 IMPLANT
SUT SILK 2 0 (SUTURE)
SUT SILK 2-0 18XBRD TIE 12 (SUTURE) IMPLANT
SUT SILK 3 0 (SUTURE) ×1
SUT SILK 3-0 FS1 18XBRD (SUTURE) IMPLANT
SUT VIC AB 3-0 SH 27 (SUTURE) ×1
SUT VIC AB 3-0 SH 27X BRD (SUTURE) ×1 IMPLANT
SYR BULB IRRIG 60ML STRL (SYRINGE) ×1 IMPLANT
SYR CONTROL 10ML LL (SYRINGE) ×1 IMPLANT
WATER STERILE IRR 500ML POUR (IV SOLUTION) ×1 IMPLANT

## 2022-10-25 NOTE — H&P (Signed)
HPI: Ms Deamer is a 56yo here for INTERSTIM REVISION She was doing well until 2 months ago when she developed pain with the device on and the pain radiates down her leg. She gets a shocked by the device. She has fallen twice in the past couple months. She gets no warning for her incontinence. KUb from today shows the lead has been advanced 1cm into the S3 foramen.     PMH:     Past Medical History:  Diagnosis Date   Anemia     Anxiety     Arthritis     Asthma     Cervical radiculopathy     Cluster headaches     COPD (chronic obstructive pulmonary disease) (HCC)     COPD (chronic obstructive pulmonary disease) with chronic bronchitis     GERD (gastroesophageal reflux disease)     Hyperlipemia     Hypoglycemia     Hypothyroidism     Migraine     Pneumothorax     PONV (postoperative nausea and vomiting)     Pre-diabetes     Rectal discharge 07/28/2010   Shortness of breath     Sleep apnea      cannot tolerate-not using CPAP   Sluggishness 07/28/2010          Surgical History:      Past Surgical History:  Procedure Laterality Date   ABDOMINAL HYSTERECTOMY        with bladder suspension   CERVICAL FUSION       CHOLECYSTECTOMY       COLONOSCOPY N/A 07/30/2013    Procedure: COLONOSCOPY;  Surgeon: Malissa Hippo, MD;  Location: [facility];  Service: Endoscopy;  Laterality: N/A;  930   EXAM UNDER ANESTHESIA WITH MANIPULATION OF SHOULDER Right 10/11/2016    Procedure: EXAM UNDER ANESTHESIA WITH MANIPULATION OF SHOULDER;  Surgeon: Vickki Hearing, MD;  Location: [facility];  Service: Orthopedics;  Laterality: Right;   HERNIA REPAIR       INCISIONAL HERNIA REPAIR N/A 07/30/2016    Procedure: HERNIA REPAIR INCISIONAL WITH MESH;  Surgeon: [name], MD;  Location: [facility];  Service: General;  Laterality: N/A;   INTERSTIM IMPLANT PLACEMENT   02/02/2021    Procedure: Leane Platt IMPLANT FIRST STAGE LEFT SACRUM ;  Surgeon: Malen Gauze, MD;  Location: [facility];  Service:  Urology;;   Leane Platt IMPLANT PLACEMENT N/A 02/23/2021    Procedure: Leane Platt IMPLANT SECOND STAGE;  Surgeon: Malen Gauze, MD;  Location: [facility];  Service: Urology;  Laterality: N/A;  Rep coming, time needs to stay at 2:00   JOINT REPLACEMENT       LOBECTOMY        right lung    POSTERIOR CERVICAL LAMINECTOMY Right 04/05/2017    Procedure: Right C6-7 Posterior cervical laminectomy;  Surgeon: Donalee Citrin, MD;  Location: [facility];  Service: Neurosurgery;  Laterality: Right;  Right C6-7 Posterior cervical laminectomy   SHOULDER ARTHROSCOPY WITH ROTATOR CUFF REPAIR Right 10/11/2016    Procedure: SHOULDER ARTHROSCOPY;  Surgeon: Vickki Hearing, MD;  Location: [facility];  Service: Orthopedics;  Laterality: Right;   TOTAL HIP ARTHROPLASTY Right 04/15/2020    Procedure: RIGHT TOTAL HIP ARTHROPLASTY ANTERIOR APPROACH;  Surgeon: Kathryne Hitch, MD;  Location: [facility];  Service: Orthopedics;  Laterality: Right;   TOTAL HIP ARTHROPLASTY Left 07/22/2020    Procedure: LEFT  HIP ARTHROPLASTY ANTERIOR APPROACH;  Surgeon: Kathryne Hitch, MD;  Location: [facility];  Service: Orthopedics;  Laterality: Left;  RNFA   TUBAL LIGATION              Home Medications:  Allergies as of 06/06/2022         Reactions    Aspirin Shortness Of Breath    Causes asthma flares     Penicillins Other (See Comments)    UNSPECIFIED REACTION OF CHILDHOOD Has patient had a PCN reaction causing immediate rash, facial/tongue/throat swelling, SOB or lightheadedness with hypotension: NO Has patient had a PCN reaction causing severe rash involving mucus membranes or skin necrosis: NO Has patient had a PCN reaction that required hospitalization: no Has patient had a PCN reaction occurring within the last 10 years: NO If all of the above answers are "NO", then may proceed with Cephalosporin use.    Vicodin [hydrocodone-acetaminophen] Other (See Comments)    Headaches             Medication List            Accurate as of June 06, 2022  9:11 AM. If you have any questions, ask your nurse or doctor.              acetaminophen 500 MG tablet Commonly known as: TYLENOL Take 1,000 mg by mouth every 6 (six) hours as needed for moderate pain.    albuterol 108 (90 Base) MCG/ACT inhaler Commonly known as: VENTOLIN HFA Inhale 2 puffs into the lungs every 4 (four) hours as needed for shortness of breath.    albuterol (2.5 MG/3ML) 0.083% nebulizer solution Commonly known as: PROVENTIL Take 3 mLs (2.5 mg total) by nebulization every 6 (six) hours as needed for wheezing or shortness of breath.    Breztri Aerosphere 160-9-4.8 MCG/ACT Aero Generic drug: Budeson-Glycopyrrol-Formoterol Inhale 2 puffs into the lungs in the morning and at bedtime.    budesonide 0.5 MG/2ML nebulizer solution Commonly known as: PULMICORT Take 2 mLs (0.5 mg total) by nebulization 2 (two) times daily as needed.    cetirizine 10 MG tablet Commonly known as: ZYRTEC Take 10 mg by mouth daily.    citalopram 20 MG tablet Commonly known as: CELEXA Take 20 mg by mouth daily.    Fasenra Pen 30 MG/ML Soaj Generic drug: Benralizumab Inject 1 mL (30 mg total) into the skin every 8 (eight) weeks.    gabapentin 100 MG capsule Commonly known as: NEURONTIN Take by mouth.    levothyroxine 75 MCG tablet Commonly known as: SYNTHROID Take 1 tablet (75 mcg total) by mouth daily before breakfast. What changed: how much to take    LORazepam 1 MG tablet Commonly known as: ATIVAN Take 1.5 mg by mouth 2 (two) times daily.    montelukast 10 MG tablet Commonly known as: SINGULAIR TAKE 1 TABLET BY MOUTH AT BEDTIME    NASACORT ALLERGY 24HR NA Place 2 sprays into the nose daily as needed (allergies).    OneTouch Delica Plus Lancet33G Misc Apply topically.    pantoprazole 40 MG tablet Commonly known as: PROTONIX Take 30- 60 min before your first and last meals of the day What changed:  how much to take how to take  this when to take this additional instructions    pravastatin 40 MG tablet Commonly known as: PRAVACHOL Take 40 mg by mouth at bedtime.    predniSONE 5 MG tablet Commonly known as: DELTASONE Take 1 tablet (5 mg total) by mouth daily with breakfast.    pregabalin 75 MG capsule Commonly known as: LYRICA Take 100 mg by  mouth 2 (two) times daily.    Spacer/Aero-Holding Rudean Curt 1 each by Does not apply route daily as needed.    SUMAtriptan 6 MG/0.5ML Soaj Inject 6 mg as directed daily as needed (migraine).    valACYclovir 500 MG tablet Commonly known as: VALTREX Take 500-2,000 mg by mouth See admin instructions. Take 2000mg s at first sign of fever blister outbreak then take 500mg s daily until gone    VITAMIN D3 SUPER STRENGTH PO Take 5,000 Units by mouth daily.             Allergies:  Allergies       Allergies  Allergen Reactions   Aspirin Shortness Of Breath      Causes asthma flares    Penicillins Other (See Comments)      UNSPECIFIED REACTION OF CHILDHOOD   Has patient had a PCN reaction causing immediate rash, facial/tongue/throat swelling, SOB or lightheadedness with hypotension: NO Has patient had a PCN reaction causing severe rash involving mucus membranes or skin necrosis: NO Has patient had a PCN reaction that required hospitalization: no Has patient had a PCN reaction occurring within the last 10 years: NO If all of the above answers are "NO", then may proceed with Cephalosporin use.     Vicodin [Hydrocodone-Acetaminophen] Other (See Comments)      Headaches         Family History:      Family History  Problem Relation Age of Onset   Cancer Mother     COPD Mother     Diabetes Mother     Hyperlipidemia Mother     Hypertension Mother     Hyperlipidemia Father     Hypertension Father     Heart failure Maternal Grandmother     Hypertension Maternal Grandmother     Thyroid disease Maternal Grandmother     Diabetes Paternal Grandmother      Alzheimer's disease Paternal Grandmother     Heart failure Paternal Grandfather     Hypertension Paternal Grandfather     Bipolar disorder Son     Colon cancer Neg Hx            Social History:  reports that she quit smoking about 14 years ago. Her smoking use included cigarettes. She has a 30.00 pack-year smoking history. She has never used smokeless tobacco. She reports that she does not drink alcohol and does not use drugs.   ROS: All other review of systems were reviewed and are negative except what is noted above in HPI   Physical Exam: BP (!) 99/58   Pulse 86   Constitutional:  Alert and oriented, No acute distress. HEENT: [place] AT, moist mucus membranes.  Trachea midline, no masses. Cardiovascular: No clubbing, cyanosis, or edema. Respiratory: Normal respiratory effort, no increased work of breathing. GI: Abdomen is soft, nontender, nondistended, no abdominal masses GU: No CVA tenderness.  Lymph: No cervical or inguinal lymphadenopathy. Skin: No rashes, bruises or suspicious lesions. Neurologic: Grossly intact, no focal deficits, moving all 4 extremities. Psychiatric: Normal mood and affect.   Laboratory Data: Recent Labs       Lab Results  Component Value Date    WBC 12.8 (H) 10/30/2021    HGB 10.5 (L) 10/30/2021    HCT 32.8 (L) 10/30/2021    MCV 92.7 10/30/2021    PLT 284 10/30/2021        Recent Labs       Lab Results  Component Value Date    CREATININE 1.1 02/03/2022  Recent Labs  No results found for: "PSA"     Recent Labs  No results found for: "TESTOSTERONE"     Recent Labs       Lab Results  Component Value Date    HGBA1C 5.8 04/11/2022        Urinalysis Labs (Brief)          Component Value Date/Time    APPEARANCEUR Clear 06/07/2021 1148    GLUCOSEU Negative 06/07/2021 1148    BILIRUBINUR Negative 06/07/2021 1148    PROTEINUR Negative 06/07/2021 1148    NITRITE Negative 06/07/2021 1148    LEUKOCYTESUR Negative 06/07/2021  1148        Recent Labs       Lab Results  Component Value Date    LABMICR See below: 06/07/2021    WBCUA 0-5 06/07/2021    LABEPIT 0-10 06/07/2021    BACTERIA Few (A) 06/07/2021        Pertinent Imaging: KUb today: Images reviewed and discussed with the patient  Results for orders placed during the hospital encounter of 06/11/19   DG Abd 1 View   Narrative CLINICAL DATA:  Partial intestinal obstruction.   EXAM: ABDOMEN - 1 VIEW   COMPARISON:  None.   FINDINGS: Bowel gas pattern is normal without evidence of ileus or obstruction on this single view. No abnormal calcifications or acute bone findings.   IMPRESSION: Unremarkable one-view abdominal radiograph.     Electronically Signed By: Paulina Fusi M.D. On: 06/11/2019 15:39   No results found for this or any previous visit.   No results found for this or any previous visit.   No results found for this or any previous visit.   No results found for this or any previous visit.   No valid procedures specified. No results found for this or any previous visit.   No results found for this or any previous visit.     Assessment & Plan:     1. Urinary incontinence, unspecified type -We discussed interstim revision and the patient wishes to proceed with surgery. Risks/benefits/alternatives discussed - Urinalysis, Routine w reflex microscopic     No follow-ups on file.   Wilkie Aye, MD   [facility] [place]

## 2022-10-25 NOTE — Anesthesia Procedure Notes (Signed)
Date/Time: 10/25/2022 9:22 AM  Performed by: Franco Nones, CRNAPre-anesthesia Checklist: Patient identified, Emergency Drugs available, Suction available, Timeout performed and Patient being monitored Patient Re-evaluated:Patient Re-evaluated prior to induction Oxygen Delivery Method: Nasal Cannula

## 2022-10-25 NOTE — Anesthesia Preprocedure Evaluation (Signed)
Anesthesia Evaluation  Patient identified by MRN, date of birth, ID band Patient awake    Reviewed: Allergy & Precautions, H&P , NPO status , Patient's Chart, lab work & pertinent test results  History of Anesthesia Complications (+) PONV and history of anesthetic complications  Airway Mallampati: II  TM Distance: >3 FB Neck ROM: Full   Comment: Cervical fusion, Cervical radiculopathy  Dental  (+) Edentulous Upper, Edentulous Lower   Pulmonary shortness of breath, with exertion and Long-Term Oxygen Therapy, asthma , sleep apnea and Oxygen sleep apnea , COPD,  COPD inhaler and oxygen dependent, former smoker   Pulmonary exam normal breath sounds clear to auscultation       Cardiovascular Exercise Tolerance: Poor Normal cardiovascular exam Rhythm:Regular Rate:Normal     Neuro/Psych  Headaches PSYCHIATRIC DISORDERS Anxiety      Neuromuscular disease (Cervical radiculopathy)    GI/Hepatic Neg liver ROS,GERD  Medicated and Controlled,,  Endo/Other  Hypothyroidism    Renal/GU negative Renal ROS  negative genitourinary   Musculoskeletal  (+) Arthritis , Osteoarthritis,    Abdominal   Peds negative pediatric ROS (+)  Hematology  (+) Blood dyscrasia, anemia   Anesthesia Other Findings   Reproductive/Obstetrics negative OB ROS                             Anesthesia Physical Anesthesia Plan  ASA: 4  Anesthesia Plan: MAC   Post-op Pain Management: Dilaudid IV   Induction: Intravenous  PONV Risk Score and Plan: 4 or greater and Ondansetron and Dexamethasone  Airway Management Planned: Nasal Cannula, Natural Airway and Simple Face Mask  Additional Equipment:   Intra-op Plan:   Post-operative Plan:   Informed Consent: I have reviewed the patients History and Physical, chart, labs and discussed the procedure including the risks, benefits and alternatives for the proposed anesthesia with the  patient or authorized representative who has indicated his/her understanding and acceptance.       Plan Discussed with: CRNA and Surgeon  Anesthesia Plan Comments:        Anesthesia Quick Evaluation

## 2022-10-25 NOTE — Anesthesia Postprocedure Evaluation (Signed)
Anesthesia Post Note  Patient: [name]  Procedure(s) Performed: REVISION OF INTERSTIM (Back)  Patient location during evaluation: Phase II Anesthesia Type: MAC Level of consciousness: awake and alert and oriented Pain management: pain level controlled Vital Signs Assessment: post-procedure vital signs reviewed and stable Respiratory status: spontaneous breathing, nonlabored ventilation, respiratory function stable and patient connected to nasal cannula oxygen Cardiovascular status: blood pressure returned to baseline and stable Postop Assessment: no apparent nausea or vomiting Anesthetic complications: no  No notable events documented.   Last Vitals:  Vitals:   10/25/22 1116 10/25/22 1135  BP:  106/78  Pulse:  76  Resp:    Temp:  (!) 36.3 C  SpO2: 100% 100%    Last Pain:  Vitals:   10/25/22 1135  TempSrc: Axillary  PainSc: 0-No pain                 Joyce Leckey C Dylann Gallier

## 2022-10-25 NOTE — OR Nursing (Signed)
Complained of headache, rated pain 4. Tylenol given iv,  stated headache is getting g better rated 2

## 2022-10-25 NOTE — Op Note (Addendum)
Preoperative diagnosis: nonfunctioning interstim, overactive bladder   Postoperative diagnosis: Same   Procedure: 1. Removal of Interstim lead and battery 2. Placement of InterStim stage I and Stage II and impedance check   Surgeon: Dr. Wilkie Aye   Assistant: None   Antibiotics: Ancef   Drains: None   Indications: The patient is a 57yo with a hx of OAB who currently has an interstim in place which is not functioning. After discussing treatment options she has elected to proceed with Interstim Stage 1 and Stage 2 implantation   Procedure in detail: Prior to the procedure consent was obtained. The patient was brought to the operating room and a brief time out was completed to ensure correct patient, correct procedure and correct site. Preoperative antibiotics were given. Extra care was taken positioning the patient in a prone position. Usual skin preparation was utilized.     A 4 cm right upper buttock incision was made over the previus battery. 10 mL of a lidocaine epinephrine mixture was utilized prior to incision. It was carried down to appropriate depth of the battery. The battery was then brought into the operative field. We then marked the lead insertion into the sacrum with fluoroscopy. We then injected 5cc of lidocaine over the site and then made a 1.5cm incision. We dissected down to the lead. Using gentle traction the lead was removed intact. We then cut the lead and removed both the lead and the battery.    Using lateral and AP fluoroscopy I marked S3. After several minutes of testing I was in the S4 foramina with a bellows response and the foramina was below the bone knuckle noted on x-ray. Eventually I was in S3 on the patient's left side above the knuckle.   Approximate 12 mL of a lidocaine epinephrine mixture was utilized in the midline. Bony table was anesthetized. The 5 inch foramen needle was introduced into the S3 foramina as noted above. At very low setting she had  an excellent bellows and toe response   The inner aspect of the foramen needle was removed and the guide was placed to the appropriate depth removing the framing needle. Under fluoroscopic guidance after making a 1 cm incision the white trocar was passed to the appropriate depth. The lead with a hockey stick bend was placed to the appropriate depth just bridging the bone medially. She had excellent bellows and toe response at all 4 settings. Under fluoroscopic guidance the inner aspect of the lead was removed. X-rays were taken.   With the passer I passed the lead from medial to lateral to the previous battery pocket. We then passed the lead across the midline and to the right upper buttock. The lead was then brought through this incision.  We then attached the generator and battery to the lead and tightened the screws. We then placed the generator in the previous pocket in the right upper buttock. We closed the deep subcutaneous tissue with 2-0 vicryl in a running fashion. The incisions were then closed with running 4-0 monocryl.   Impedance check was done utilizing sterile technique and was normal in all 4 positions We then placed dermabond on the incisions and this concluded the procedure which was well tolerated by the patient.   Complications: none   Condition: stable, transferred to PACU   Plan: The patient is to be discharged home after she voids in the PACU. If she cannot void the foley will be replaced and she will followup in 2-3 days for  a second voiding trial. If she is able to void she will keep a voiding diary and will be reassessed in 1 week for response.

## 2022-10-25 NOTE — Transfer of Care (Addendum)
Immediate Anesthesia Transfer of Care Note  Patient: [name]  Procedure(s) Performed: REVISION OF INTERSTIM (Back)  Patient Location: PACU  Anesthesia Type:MAC  Level of Consciousness: drowsy and patient cooperative  Airway & Oxygen Therapy: Patient Spontanous Breathing Nasal Cannula  Post-op Assessment: Report given to RN and Post -op Vital signs reviewed and stable  Post vital signs: Reviewed and stable  Last Vitals:  Vitals Value Taken Time  BP 99/71 10/25/22 1048  Temp 97.7 10/25/22 1051  Pulse 79 10/25/22 1051  Resp 14 10/25/22 1051  SpO2 100 % 10/25/22 1051  Vitals shown include unfiled device data.  Last Pain:  Vitals:   10/25/22 0705  TempSrc: Oral  PainSc: 4          Complications: No notable events documented.

## 2022-10-25 NOTE — Discharge Instructions (Addendum)
Plan: The patient is to be discharged home after she voids in the PACU. If she cannot void the foley will be replaced and she will followup in 2-3 days for a second voiding trial. If she is able to void she will keep a voiding diary and will be reassessed in 1 week for response.

## 2022-10-29 ENCOUNTER — Encounter (HOSPITAL_COMMUNITY): Payer: Self-pay | Admitting: Urology

## 2022-10-30 ENCOUNTER — Other Ambulatory Visit: Payer: Self-pay

## 2022-10-30 ENCOUNTER — Other Ambulatory Visit (HOSPITAL_COMMUNITY): Payer: Self-pay

## 2022-10-31 ENCOUNTER — Telehealth: Payer: Self-pay

## 2022-10-31 DIAGNOSIS — R32 Unspecified urinary incontinence: Secondary | ICD-10-CM

## 2022-10-31 DIAGNOSIS — N3000 Acute cystitis without hematuria: Secondary | ICD-10-CM

## 2022-10-31 NOTE — Telephone Encounter (Signed)
Return call to patient. Patient states that she is in pain and having redness around the incision and want to know if that was normal. Patient states that she can not take vicodin. Patient states that she is currently taking Bactrim and have one day left.Patient is aware a message will be sent to Dr. Ronne Binning on recommendation. Patient voiced understanding

## 2022-11-01 ENCOUNTER — Other Ambulatory Visit: Payer: Self-pay | Admitting: Urology

## 2022-11-01 MED ORDER — OXYCODONE HCL 15 MG PO TABS: 15.0000 mg | ORAL_TABLET | ORAL | 0 refills | Status: DC | PRN

## 2022-11-01 MED ORDER — DOXYCYCLINE HYCLATE 100 MG PO CAPS
100.0000 mg | ORAL_CAPSULE | Freq: Two times a day (BID) | ORAL | 0 refills | Status: DC
Start: 2022-11-01 — End: 2022-12-11

## 2022-11-01 NOTE — Addendum Note (Signed)
Addended by: Christoper Fabian R on: 11/01/2022 02:57 PM   Modules accepted: Orders

## 2022-11-01 NOTE — Telephone Encounter (Signed)
Patient is made aware a Rx doxycycline was sent to her pharmacy. Per Dr. Ronne Binning  "Please send in the doxy."  Patient is made aware to start the doxy tomorrow and to take 1 table BID. Patient voiced understanding

## 2022-11-01 NOTE — Telephone Encounter (Signed)
Voice mailed left from [facility] pharmacy needing a DX code for patient due to patient being prescribe oxycodone 15 mg. Return call to pharmacist, Judeth Cornfield Pharmacist states that because patient has never taking pain medication longer than 5 days she wants to ensure MD wants patient to have this medication due to the potential of overdose /addiction due to the high dose. Judeth Cornfield is made aware patient had surgery for inter stem revision and medication is warrant. Judeth Cornfield voiced understanding

## 2022-11-02 ENCOUNTER — Ambulatory Visit: Payer: Medicare Other | Admitting: Urology

## 2022-11-02 VITALS — BP 97/62 | HR 89

## 2022-11-02 DIAGNOSIS — R32 Unspecified urinary incontinence: Secondary | ICD-10-CM

## 2022-11-02 DIAGNOSIS — Z09 Encounter for follow-up examination after completed treatment for conditions other than malignant neoplasm: Secondary | ICD-10-CM

## 2022-11-02 MED ORDER — CLOTRIMAZOLE-BETAMETHASONE 1-0.05 % EX CREA: 1.0000 | TOPICAL_CREAM | Freq: Two times a day (BID) | CUTANEOUS | 3 refills | Status: DC

## 2022-11-02 NOTE — Progress Notes (Unsigned)
11/02/2022 10:23 AM   Melvenia Beam Aline Brochure 1965/07/20 [id number]  Referring provider: Benita Stabile, MD [address] [place],  [place] [address]  No chief complaint on file.   HPI: She has irritation around the incision. She denies nay worsening erythema around the incision.    PMH: Past Medical History:  Diagnosis Date   Anemia    Anxiety    Arthritis    Asthma    Cervical radiculopathy    Cluster headaches    COPD (chronic obstructive pulmonary disease) (HCC)    COPD (chronic obstructive pulmonary disease) with chronic bronchitis    GERD (gastroesophageal reflux disease)    Hyperlipemia    Hypoglycemia    Hypothyroidism    Migraine    Pneumothorax    PONV (postoperative nausea and vomiting)    Pre-diabetes    Rectal discharge 07/28/2010   Shortness of breath    Sleep apnea    cannot tolerate-not using CPAP   Sluggishness 07/28/2010    Surgical History: Past Surgical History:  Procedure Laterality Date   ABDOMINAL HYSTERECTOMY     with bladder suspension   CERVICAL FUSION     CHOLECYSTECTOMY     COLONOSCOPY N/A 07/30/2013   Procedure: COLONOSCOPY;  Surgeon: Malissa Hippo, MD;  [facility]: [facility];  Service: Endoscopy;  Laterality: N/A;  930   EXAM UNDER ANESTHESIA WITH MANIPULATION OF SHOULDER Right 10/11/2016   Procedure: EXAM UNDER ANESTHESIA WITH MANIPULATION OF SHOULDER;  Surgeon: Vickki Hearing, MD;  Location: [facility];  Service: Orthopedics;  Laterality: Right;   HERNIA REPAIR     INCISIONAL HERNIA REPAIR N/A 07/30/2016   Procedure: HERNIA REPAIR INCISIONAL WITH MESH;  Surgeon: [name], MD;  Location: [facility];  Service: General;  Laterality: N/A;   INTERSTIM IMPLANT PLACEMENT  02/02/2021   Procedure: Leane Platt IMPLANT FIRST STAGE LEFT SACRUM ;  Surgeon: Malen Gauze, MD;  Location: [facility];  Service: Urology;;   Leane Platt IMPLANT PLACEMENT N/A 02/23/2021   Procedure: Leane Platt IMPLANT SECOND STAGE;  Surgeon: Malen Gauze,  MD;  Location: [facility];  Service: Urology;  Laterality: N/A;  Rep coming, time needs to stay at 2:00   INTERSTIM IMPLANT REVISION N/A 10/25/2022   Procedure: REVISION OF Leane Platt;  Surgeon: Malen Gauze, MD;  Location: [facility];  Service: Urology;  Laterality: N/A;   JOINT REPLACEMENT     LOBECTOMY     right lung    POSTERIOR CERVICAL LAMINECTOMY Right 04/05/2017   Procedure: Right C6-7 Posterior cervical laminectomy;  Surgeon: Donalee Citrin, MD;  Location[facility];  Service: Neurosurgery;  Laterality: Right;  Right C6-7 Posterior cervical laminectomy   SHOULDER ARTHROSCOPY WITH ROTATOR CUFF REPAIR Right 10/11/2016   Procedure: SHOULDER ARTHROSCOPY;  Surgeon: Vickki Hearing, MD;  Location: [facility];  Service: Orthopedics;  Laterality: Right;   TOTAL HIP ARTHROPLASTY Right 04/15/2020   Procedure: RIGHT TOTAL HIP ARTHROPLASTY ANTERIOR APPROACH;  Surgeon: Kathryne Hitch, MD;  Location: [facility];  Service: Orthopedics;  Laterality: Right;   TOTAL HIP ARTHROPLASTY Left 07/22/2020   Procedure: LEFT  HIP ARTHROPLASTY ANTERIOR APPROACH;  Surgeon: Kathryne Hitch, MD;  Location: [facility];  Service: Orthopedics;  Laterality: Left;  RNFA   TUBAL LIGATION      Home Medications:  Allergies as of 11/02/2022       Reactions   Aspirin Shortness Of Breath   Causes asthma flares    Penicillins Other (See Comments)   UNSPECIFIED REACTION OF  CHILDHOOD Has patient had a PCN reaction causing immediate rash, facial/tongue/throat swelling, SOB or lightheadedness with hypotension: NO Has patient had a PCN reaction causing severe rash involving mucus membranes or skin necrosis: NO Has patient had a PCN reaction that required hospitalization: no Has patient had a PCN reaction occurring within the last 10 years: NO If all of the above answers are "NO", then may proceed with Cephalosporin use.   Vicodin [hydrocodone-acetaminophen] Other (See Comments)   Headaches         Medication List         Accurate as of November 02, 2022 10:23 AM. If you have any questions, ask your nurse or doctor.          acetaminophen 500 MG tablet Commonly known as: TYLENOL Take 1,000 mg by mouth every 6 (six) hours as needed for moderate pain.   albuterol 108 (90 Base) MCG/ACT inhaler Commonly known as: VENTOLIN HFA Inhale 2 puffs into the lungs every 4 (four) hours as needed for shortness of breath.   albuterol (2.5 MG/3ML) 0.083% nebulizer solution Commonly known as: PROVENTIL USE 1 VIAL IN NEBULIZER EVERY 6 HOURS AS NEEDED FOR WHEEZING OR SHORTNESS OF BREATH   Breztri Aerosphere 160-9-4.8 MCG/ACT Aero Generic drug: Budeson-Glycopyrrol-Formoterol Inhale 2 puffs into the lungs in the morning and at bedtime.   cetirizine 10 MG tablet Commonly known as: ZYRTEC Take 10 mg by mouth daily.   citalopram 20 MG tablet Commonly known as: CELEXA Take 20 mg by mouth daily.   doxycycline 100 MG capsule Commonly known as: VIBRAMYCIN Take 1 capsule (100 mg total) by mouth every 12 (twelve) hours.   Fasenra Pen 30 MG/ML prefilled autoinjector Generic drug: benralizumab Inject 1 mL (30 mg total) into the skin every 8 (eight) weeks.   hydroxypropyl methylcellulose / hypromellose 2.5 % ophthalmic solution Commonly known as: ISOPTO TEARS / GONIOVISC Place 1 drop into both eyes as needed for dry eyes.   latanoprost 0.005 % ophthalmic solution Commonly known as: XALATAN Place 1 drop into both eyes at bedtime.   levothyroxine 75 MCG tablet Commonly known as: SYNTHROID Take 1 tablet (75 mcg total) by mouth daily before breakfast.   montelukast 10 MG tablet Commonly known as: SINGULAIR Take 1 tablet (10 mg total) by mouth at bedtime.   multivitamin with minerals Tabs tablet Take 1 tablet by mouth daily.   NASACORT ALLERGY 24HR NA Place 2 sprays into the nose daily as needed (allergies).   OneTouch Delica Plus Lancet33G Misc Apply topically.   oxyCODONE 15 MG immediate release  tablet Commonly known as: ROXICODONE Take 1 tablet (15 mg total) by mouth every 4 (four) hours as needed for pain.   oxyCODONE-acetaminophen 5-325 MG tablet Commonly known as: Percocet Take 1 tablet by mouth every 4 (four) hours as needed.   pantoprazole 40 MG tablet Commonly known as: PROTONIX Take 30- 60 min before your first and last meals of the day   pravastatin 40 MG tablet Commonly known as: PRAVACHOL Take 40 mg by mouth at bedtime.   predniSONE 5 MG tablet Commonly known as: DELTASONE Take 1 tablet (5 mg total) by mouth daily with breakfast.   pregabalin 75 MG capsule Commonly known as: LYRICA Take 75 mg by mouth 2 (two) times daily.   Spacer/Aero-Holding Rudean Curt 1 each by Does not apply route daily as needed.   sulfamethoxazole-trimethoprim 800-160 MG tablet Commonly known as: BACTRIM DS Take 1 tablet by mouth 2 (two) times daily.   SUMAtriptan 6  MG/0.5ML Soaj Inject 6 mg as directed daily as needed (migraine).   valACYclovir 500 MG tablet Commonly known as: VALTREX Take 500-2,000 mg by mouth See admin instructions. Take 2000mg s at first sign of fever blister outbreak then take 500mg s daily until gone   VITAMIN D3 SUPER STRENGTH PO Take 5,000 Units by mouth daily.        Allergies:  Allergies  Allergen Reactions   Aspirin Shortness Of Breath    Causes asthma flares    Penicillins Other (See Comments)    UNSPECIFIED REACTION OF CHILDHOOD  Has patient had a PCN reaction causing immediate rash, facial/tongue/throat swelling, SOB or lightheadedness with hypotension: NO Has patient had a PCN reaction causing severe rash involving mucus membranes or skin necrosis: NO Has patient had a PCN reaction that required hospitalization: no Has patient had a PCN reaction occurring within the last 10 years: NO If all of the above answers are "NO", then may proceed with Cephalosporin use.    Vicodin [Hydrocodone-Acetaminophen] Other (See Comments)    Headaches      Family History: Family History  Problem Relation Age of Onset   Neuropathy Mother    Migraines Mother    Cancer Mother    COPD Mother    Diabetes Mother    Hyperlipidemia Mother    Hypertension Mother    Hyperlipidemia Father    Hypertension Father    Migraines Sister    Migraines Maternal Aunt    Stroke Maternal Grandmother    Heart failure Maternal Grandmother    Hypertension Maternal Grandmother    Thyroid disease Maternal Grandmother    Diabetes Paternal Grandmother    Alzheimer's disease Paternal Grandmother    Stroke Paternal Grandfather    Heart failure Paternal Grandfather    Hypertension Paternal Grandfather    Bipolar disorder Son    Colon cancer Neg Hx     Social History:  reports that she quit smoking about 14 years ago. Her smoking use included cigarettes. She started smoking about 44 years ago. She has a 30 pack-year smoking history. She has never used smokeless tobacco. She reports that she does not drink alcohol and does not use drugs.  ROS: All other review of systems were reviewed and are negative except what is noted above in HPI  Physical Exam: BP 97/62   Pulse 89   Constitutional:  Alert and oriented, No acute distress. HEENT: [place] AT, moist mucus membranes.  Trachea midline, no masses. Cardiovascular: No clubbing, cyanosis, or edema. Respiratory: Normal respiratory effort, no increased work of breathing. GI: Abdomen is soft, nontender, nondistended, no abdominal masses GU: No CVA tenderness.  Lymph: No cervical or inguinal lymphadenopathy. Skin: No rashes, bruises or suspicious lesions. Neurologic: Grossly intact, no focal deficits, moving all 4 extremities. Psychiatric: Normal mood and affect.  Laboratory Data: Lab Results  Component Value Date   WBC 12.8 (H) 10/30/2021   HGB 10.5 (L) 10/30/2021   HCT 32.8 (L) 10/30/2021   MCV 92.7 10/30/2021   PLT 284 10/30/2021    Lab Results  Component Value Date   CREATININE 0.91 10/03/2022     No results found for: "PSA"  No results found for: "TESTOSTERONE"  Lab Results  Component Value Date   HGBA1C 5.6 10/10/2022    Urinalysis    Component Value Date/Time   APPEARANCEUR Clear 06/07/2021 1148   GLUCOSEU Negative 06/07/2021 1148   BILIRUBINUR Negative 06/07/2021 1148   PROTEINUR Negative 06/07/2021 1148   NITRITE Negative 06/07/2021 1148  LEUKOCYTESUR Negative 06/07/2021 1148    Lab Results  Component Value Date   LABMICR See below: 06/07/2021   WBCUA 0-5 06/07/2021   LABEPIT 0-10 06/07/2021   BACTERIA Few (A) 06/07/2021    Pertinent Imaging: *** Results for orders placed in visit on 06/06/22  Abdomen 1 view (KUB)  Narrative CLINICAL DATA:  Foreign body. Patient states that her bladder stimulator is shocking her 2 times per month following a fall. Urinary incontinence.  EXAM: ABDOMEN - 1 VIEW  COMPARISON:  CT abdomen and pelvis 01/01/2020  FINDINGS: Nonobstructive bowel-gas pattern. Large stool burden in the right colon. Cholecystectomy. No acute osseous abnormality. Bilateral THA. Bladder stimulator projecting over the right iliac bone with lead projecting over the superior left pelvis.  IMPRESSION: 1. Bladder stimulator projecting over the right iliac bone with lead projecting over the superior left pelvis. 2. Large stool burden in the right colon.   Electronically Signed By: Minerva Fester M.D. On: 06/08/2022 02:01  No results found for this or any previous visit.  No results found for this or any previous visit.  No results found for this or any previous visit.  No results found for this or any previous visit.  No valid procedures specified. No results found for this or any previous visit.  No results found for this or any previous visit.   Assessment & Plan:    1. Urinary incontinence, unspecified type ***   No follow-ups on file.  Wilkie Aye, MD  [facility] [place]

## 2022-11-06 ENCOUNTER — Encounter: Payer: Self-pay | Admitting: Urology

## 2022-11-06 NOTE — Patient Instructions (Signed)

## 2022-11-08 DIAGNOSIS — R351 Nocturia: Secondary | ICD-10-CM | POA: Insufficient documentation

## 2022-11-08 DIAGNOSIS — N3941 Urge incontinence: Secondary | ICD-10-CM | POA: Insufficient documentation

## 2022-11-08 DIAGNOSIS — R3915 Urgency of urination: Secondary | ICD-10-CM | POA: Insufficient documentation

## 2022-11-08 DIAGNOSIS — Z960 Presence of urogenital implants: Secondary | ICD-10-CM | POA: Insufficient documentation

## 2022-11-08 NOTE — Progress Notes (Deleted)
Name: [name] DOB: 12/20/1965 MRN: [id number]  History of Present Illness: [name] is a 57 y.o. female who presents today for follow up visit at [facility] [place]. - GU history: 1. OAB with urinary frequency, nocturia, urgency, and urge incontinence. - She has an InterStim sacral neuromodulation device. Implanted by Dr. Ronne Binning on 02/23/2021; lead & battery were both replaced on 10/25/2022 due to non-functioning. - Previously failed trials of Myrbetriq, Detrol, Vesicare, and Toviaz. 2. Stress urinary incontinence (resolved). Mid-urethral sling placed by Dr. Jerre Simon in 2008.   At last visit with Dr. Ronne Binning on 11/02/2022: Seen for irritation around the incision. No acute findings per note.   ***implant site skin check  Today: She {Actions; [id number] redness, warmth, tenderness, swelling, or drainage at the InterStim implant site(s).   She reports {Blank multiple:19197::"improved","persistent / unchanged"} urinary ***frequency, ***nocturia, ***urgency, and ***urge incontinence. Voiding ***x/day and ***x/night on average. Leaking ***x/day on average; using *** ***pads / ***diapers per day on average.  She {Actions; denies-reports:120008} dysuria, gross hematuria, straining to void, or sensations of incomplete emptying.   Fall Screening: Do you usually have a device to assist in your mobility? {yes/no:20286} ***cane / ***walker / ***wheelchair   Medications: Current Outpatient Medications  Medication Sig Dispense Refill   acetaminophen (TYLENOL) 500 MG tablet Take 1,000 mg by mouth every 6 (six) hours as needed for moderate pain.     albuterol (PROVENTIL) (2.5 MG/3ML) 0.083% nebulizer solution USE 1 VIAL IN NEBULIZER EVERY 6 HOURS AS NEEDED FOR WHEEZING OR SHORTNESS OF BREATH 75 mL 0   albuterol (VENTOLIN HFA) 108 (90 Base) MCG/ACT inhaler Inhale 2 puffs into the lungs every 4 (four) hours as needed for shortness of breath. 2 each 2    benralizumab (FASENRA PEN) 30 MG/ML prefilled autoinjector Inject 1 mL (30 mg total) into the skin every 8 (eight) weeks. 1 mL 6   Budeson-Glycopyrrol-Formoterol (BREZTRI AEROSPHERE) 160-9-4.8 MCG/ACT AERO Inhale 2 puffs into the lungs in the morning and at bedtime. 3 each 4   cetirizine (ZYRTEC) 10 MG tablet Take 10 mg by mouth daily.     Cholecalciferol (VITAMIN D3 SUPER STRENGTH PO) Take 5,000 Units by mouth daily.     citalopram (CELEXA) 20 MG tablet Take 20 mg by mouth daily.     clotrimazole-betamethasone (LOTRISONE) cream Apply 1 Application topically 2 (two) times daily. 30 g 3   doxycycline (VIBRAMYCIN) 100 MG capsule Take 1 capsule (100 mg total) by mouth every 12 (twelve) hours. 14 capsule 0   hydroxypropyl methylcellulose / hypromellose (ISOPTO TEARS / GONIOVISC) 2.5 % ophthalmic solution Place 1 drop into both eyes as needed for dry eyes.     Lancets (ONETOUCH DELICA PLUS LANCET33G) MISC Apply topically.     latanoprost (XALATAN) 0.005 % ophthalmic solution Place 1 drop into both eyes at bedtime.     levothyroxine (SYNTHROID) 75 MCG tablet Take 1 tablet (75 mcg total) by mouth daily before breakfast. 90 tablet 1   montelukast (SINGULAIR) 10 MG tablet Take 1 tablet (10 mg total) by mouth at bedtime. 90 tablet 1   Multiple Vitamin (MULTIVITAMIN WITH MINERALS) TABS tablet Take 1 tablet by mouth daily.     oxyCODONE (ROXICODONE) 15 MG immediate release tablet Take 1 tablet (15 mg total) by mouth every 4 (four) hours as needed for pain. 30 tablet 0   oxyCODONE-acetaminophen (PERCOCET) 5-325 MG tablet Take 1 tablet by mouth every 4 (four) hours as needed. 30 tablet 0   pantoprazole (PROTONIX) 40  MG tablet Take 30- 60 min before your first and last meals of the day     pravastatin (PRAVACHOL) 40 MG tablet Take 40 mg by mouth at bedtime.     predniSONE (DELTASONE) 5 MG tablet Take 1 tablet (5 mg total) by mouth daily with breakfast. 100 tablet 1   pregabalin (LYRICA) 75 MG capsule Take 75 mg  by mouth 2 (two) times daily.     Spacer/Aero-Holding Chambers DEVI 1 each by Does not apply route daily as needed. 1 each 2   sulfamethoxazole-trimethoprim (BACTRIM DS) 800-160 MG tablet Take 1 tablet by mouth 2 (two) times daily. 14 tablet 0   SUMAtriptan 6 MG/0.5ML SOAJ Inject 6 mg as directed daily as needed (migraine).      Triamcinolone Acetonide (NASACORT ALLERGY 24HR NA) Place 2 sprays into the nose daily as needed (allergies).     valACYclovir (VALTREX) 500 MG tablet Take 500-2,000 mg by mouth See admin instructions. Take 2000mg s at first sign of fever blister outbreak then take 500mg s daily until gone     No current facility-administered medications for this visit.    Allergies: Allergies  Allergen Reactions   Aspirin Shortness Of Breath    Causes asthma flares    Penicillins Other (See Comments)    UNSPECIFIED REACTION OF CHILDHOOD  Has patient had a PCN reaction causing immediate rash, facial/tongue/throat swelling, SOB or lightheadedness with hypotension: NO Has patient had a PCN reaction causing severe rash involving mucus membranes or skin necrosis: NO Has patient had a PCN reaction that required hospitalization: no Has patient had a PCN reaction occurring within the last 10 years: NO If all of the above answers are "NO", then may proceed with Cephalosporin use.    Vicodin [Hydrocodone-Acetaminophen] Other (See Comments)    Headaches     Past Medical History:  Diagnosis Date   Anemia    Anxiety    Arthritis    Asthma    Cervical radiculopathy    Cluster headaches    COPD (chronic obstructive pulmonary disease) (HCC)    COPD (chronic obstructive pulmonary disease) with chronic bronchitis    GERD (gastroesophageal reflux disease)    Hyperlipemia    Hypoglycemia    Hypothyroidism    Migraine    Pneumothorax    PONV (postoperative nausea and vomiting)    Pre-diabetes    Rectal discharge 07/28/2010   Shortness of breath    Sleep apnea    cannot tolerate-not  using CPAP   Sluggishness 07/28/2010   Past Surgical History:  Procedure Laterality Date   ABDOMINAL HYSTERECTOMY     with bladder suspension   CERVICAL FUSION     CHOLECYSTECTOMY     COLONOSCOPY N/A 07/30/2013   Procedure: COLONOSCOPY;  Surgeon: Malissa Hippo, MD;  Location: [facility];  Service: Endoscopy;  Laterality: N/A;  930   EXAM UNDER ANESTHESIA WITH MANIPULATION OF SHOULDER Right 10/11/2016   Procedure: EXAM UNDER ANESTHESIA WITH MANIPULATION OF SHOULDER;  Surgeon: Vickki Hearing, MD;  Location: [facility];  Service: Orthopedics;  Laterality: Right;   HERNIA REPAIR     INCISIONAL HERNIA REPAIR N/A 07/30/2016   Procedure: HERNIA REPAIR INCISIONAL WITH MESH;  Surgeon: [name], MD;  Location: [facility];  Service: General;  Laterality: N/A;   INTERSTIM IMPLANT PLACEMENT  02/02/2021   Procedure: Leane Platt IMPLANT FIRST STAGE LEFT SACRUM ;  Surgeon: Malen Gauze, MD;  Location: [facility];  Service: Urology;;   Leane Platt IMPLANT PLACEMENT N/A 02/23/2021  Procedure: INTERSTIM IMPLANT SECOND STAGE;  Surgeon: Malen Gauze, MD;  Location: [facility];  Service: Urology;  Laterality: N/A;  Rep coming, time needs to stay at 2:00   INTERSTIM IMPLANT REVISION N/A 10/25/2022   Procedure: REVISION OF Leane Platt;  Surgeon: Malen Gauze, MD;  Location: [facility];  Service: Urology;  Laterality: N/A;   JOINT REPLACEMENT     LOBECTOMY     right lung    POSTERIOR CERVICAL LAMINECTOMY Right 04/05/2017   Procedure: Right C6-7 Posterior cervical laminectomy;  Surgeon: Donalee Citrin, MD;  Location: [facility] OR;  Service: Neurosurgery;  Laterality: Right;  Right C6-7 Posterior cervical laminectomy   SHOULDER ARTHROSCOPY WITH ROTATOR CUFF REPAIR Right 10/11/2016   Procedure: SHOULDER ARTHROSCOPY;  Surgeon: Vickki Hearing, MD;  Location: [facility];  Service: Orthopedics;  Laterality: Right;   TOTAL HIP ARTHROPLASTY Right 04/15/2020   Procedure: RIGHT TOTAL HIP ARTHROPLASTY ANTERIOR APPROACH;   Surgeon: Kathryne Hitch, MD;  Location: [facility];  Service: Orthopedics;  Laterality: Right;   TOTAL HIP ARTHROPLASTY Left 07/22/2020   Procedure: LEFT  HIP ARTHROPLASTY ANTERIOR APPROACH;  Surgeon: Kathryne Hitch, MD;  Location: [facility];  Service: Orthopedics;  Laterality: Left;  RNFA   TUBAL LIGATION     Family History  Problem Relation Age of Onset   Neuropathy Mother    Migraines Mother    Cancer Mother    COPD Mother    Diabetes Mother    Hyperlipidemia Mother    Hypertension Mother    Hyperlipidemia Father    Hypertension Father    Migraines Sister    Migraines Maternal Aunt    Stroke Maternal Grandmother    Heart failure Maternal Grandmother    Hypertension Maternal Grandmother    Thyroid disease Maternal Grandmother    Diabetes Paternal Grandmother    Alzheimer's disease Paternal Grandmother    Stroke Paternal Grandfather    Heart failure Paternal Grandfather    Hypertension Paternal Grandfather    Bipolar disorder Son    Colon cancer Neg Hx    Social History   Socioeconomic History   Marital status: Married    Spouse name: Not on file   Number of children: Not on file   Years of education: Not on file   Highest education level: Not on file  Occupational History   Not on file  Tobacco Use   Smoking status: Former    Current packs/day: 0.00    Average packs/day: 1 pack/day for 30.0 years (30.0 ttl pk-yrs)    Types: Cigarettes    Start date: 02/20/1978    Quit date: 02/21/2008    Years since quitting: 14.7   Smokeless tobacco: Never  Vaping Use   Vaping status: Never Used  Substance and Sexual Activity   Alcohol use: No    Alcohol/week: 0.0 standard drinks of alcohol    Comment: no   Drug use: No   Sexual activity: Not Currently    Partners: Male    Birth control/protection: Surgical  Other Topics Concern   Not on file  Social History Narrative   Are you right handed or left handed? Right handed   Are you currently employed ?     What is your current occupation?   Do you live at home alone? NO    Who lives with you? Son   What type of home do you live in: 1 story or 2 story? 1       Social Determinants of Health  Financial Resource Strain: Not on file  Food Insecurity: Not on file  Transportation Needs: Not on file  Physical Activity: Not on file  Stress: Not on file  Social Connections: Not on file  Intimate Partner Violence: Not on file    Review of Systems Constitutional: Patient ***denies any unintentional weight loss or change in strength lntegumentary: Patient ***denies any rashes or pruritus Eyes: Patient denies ***dry eyes ENT: Patient ***denies dry mouth Cardiovascular: Patient ***denies chest pain or syncope Respiratory: Patient ***denies shortness of breath Gastrointestinal: Patient ***denies nausea, vomiting, constipation, or diarrhea Musculoskeletal: Patient ***denies muscle cramps or weakness Neurologic: Patient ***denies convulsions or seizures Psychiatric: Patient ***denies memory problems Allergic/Immunologic: Patient ***denies recent allergic reaction(s) Hematologic/Lymphatic: Patient denies bleeding tendencies Endocrine: Patient ***denies heat/cold intolerance  GU: As per HPI.  OBJECTIVE There were no vitals filed for this visit. There is no height or weight on file to calculate BMI.  Physical Examination  Constitutional: ***No obvious distress; patient is ***non-toxic appearing  Cardiovascular: ***No visible lower extremity edema.  Respiratory: The patient does ***not have audible wheezing/stridor; respirations do ***not appear labored  Gastrointestinal: Abdomen ***non-distended Musculoskeletal: ***Normal ROM of UEs  Skin: ***No obvious rashes/open sores  Neurologic: CN 2-12 grossly ***intact Psychiatric: Answered questions ***appropriately with ***normal affect  Hematologic/Lymphatic/Immunologic: ***No obvious bruises or sites of spontaneous bleeding  UA: {Desc;  negative/positive:13464} for *** WBC/hpf, *** RBC/hpf, bacteria (***) PVR: *** ml  ASSESSMENT Overactive bladder  Urinary urgency  Urge incontinence  Nocturia  Urinary frequency  S/P implantation of urinary electronic stimulator device - [employer] InterStim sacral neuromodulation device. Implanted by Dr. Ronne Binning on 02/23/2021; lead & battery were both replaced on 10/25/2022 ***  Will plan for follow up in *** months / ***1 year or sooner if needed. Pt verbalized understanding and agreement. All questions were answered.  PLAN Advised the following: 1. *** 2. ***No follow-ups on file.  No orders of the defined types were placed in this encounter.   It has been explained that the patient is to follow regularly with their PCP in addition to all other providers involved in their care and to follow instructions provided by these respective offices. Patient advised to contact urology clinic if any urologic-pertaining questions, concerns, new symptoms or problems arise in the interim period.  There are no Patient Instructions on file for this visit.  Electronically signed by:  Donnita Falls, FNP   11/08/22    4:28 PM

## 2022-11-13 ENCOUNTER — Ambulatory Visit: Payer: Medicare Other | Admitting: Urology

## 2022-11-13 DIAGNOSIS — N3281 Overactive bladder: Secondary | ICD-10-CM

## 2022-11-13 DIAGNOSIS — R351 Nocturia: Secondary | ICD-10-CM

## 2022-11-13 DIAGNOSIS — R3915 Urgency of urination: Secondary | ICD-10-CM

## 2022-11-13 DIAGNOSIS — N3941 Urge incontinence: Secondary | ICD-10-CM

## 2022-11-13 DIAGNOSIS — Z960 Presence of urogenital implants: Secondary | ICD-10-CM

## 2022-11-13 DIAGNOSIS — R350 Frequency of micturition: Secondary | ICD-10-CM

## 2022-11-19 NOTE — Progress Notes (Deleted)
Name: [name] DOB: 1965/08/05 MRN: [id number]  History of Present Illness: Ms. Stick is a 57 y.o. female who presents today for follow up visit at [facility]. - GU history: 1. OAB with urinary frequency, nocturia, urgency, and urge incontinence. - She has an InterStim sacral neuromodulation device. Implanted by Dr. Ronne Binning on 02/23/2021; lead & battery were both replaced on 10/25/2022 due to non-functioning. - Previously failed trials of Myrbetriq, Detrol, Vesicare, and Toviaz. 2. Stress urinary incontinence (resolved). Mid-urethral sling placed by Dr. Jerre Simon in 2008.   At last visit with Dr. Ronne Binning on 11/02/2022: Seen for irritation around the incision. No acute findings per note.   ***implant site skin check  Today: She {Actions; [id number] redness, warmth, tenderness, swelling, or drainage at the InterStim implant site(s).   She reports {Blank multiple:19197::"improved","persistent / unchanged"} urinary ***frequency, ***nocturia, ***urgency, and ***urge incontinence. Voiding ***x/day and ***x/night on average. Leaking ***x/day on average; using *** ***pads / ***diapers per day on average.  She {Actions; denies-reports:120008} dysuria, gross hematuria, straining to void, or sensations of incomplete emptying.   Fall Screening: Do you usually have a device to assist in your mobility? {yes/no:20286} ***cane / ***walker / ***wheelchair   Medications: Current Outpatient Medications  Medication Sig Dispense Refill   acetaminophen (TYLENOL) 500 MG tablet Take 1,000 mg by mouth every 6 (six) hours as needed for moderate pain.     albuterol (PROVENTIL) (2.5 MG/3ML) 0.083% nebulizer solution USE 1 VIAL IN NEBULIZER EVERY 6 HOURS AS NEEDED FOR WHEEZING OR SHORTNESS OF BREATH 75 mL 0   albuterol (VENTOLIN HFA) 108 (90 Base) MCG/ACT inhaler Inhale 2 puffs into the lungs every 4 (four) hours as needed for shortness of breath. 2 each 2    benralizumab (FASENRA PEN) 30 MG/ML prefilled autoinjector Inject 1 mL (30 mg total) into the skin every 8 (eight) weeks. 1 mL 6   Budeson-Glycopyrrol-Formoterol (BREZTRI AEROSPHERE) 160-9-4.8 MCG/ACT AERO Inhale 2 puffs into the lungs in the morning and at bedtime. 3 each 4   cetirizine (ZYRTEC) 10 MG tablet Take 10 mg by mouth daily.     Cholecalciferol (VITAMIN D3 SUPER STRENGTH PO) Take 5,000 Units by mouth daily.     citalopram (CELEXA) 20 MG tablet Take 20 mg by mouth daily.     clotrimazole-betamethasone (LOTRISONE) cream Apply 1 Application topically 2 (two) times daily. 30 g 3   doxycycline (VIBRAMYCIN) 100 MG capsule Take 1 capsule (100 mg total) by mouth every 12 (twelve) hours. 14 capsule 0   hydroxypropyl methylcellulose / hypromellose (ISOPTO TEARS / GONIOVISC) 2.5 % ophthalmic solution Place 1 drop into both eyes as needed for dry eyes.     Lancets (ONETOUCH DELICA PLUS LANCET33G) MISC Apply topically.     latanoprost (XALATAN) 0.005 % ophthalmic solution Place 1 drop into both eyes at bedtime.     levothyroxine (SYNTHROID) 75 MCG tablet Take 1 tablet (75 mcg total) by mouth daily before breakfast. 90 tablet 1   montelukast (SINGULAIR) 10 MG tablet Take 1 tablet (10 mg total) by mouth at bedtime. 90 tablet 1   Multiple Vitamin (MULTIVITAMIN WITH MINERALS) TABS tablet Take 1 tablet by mouth daily.     oxyCODONE (ROXICODONE) 15 MG immediate release tablet Take 1 tablet (15 mg total) by mouth every 4 (four) hours as needed for pain. 30 tablet 0   oxyCODONE-acetaminophen (PERCOCET) 5-325 MG tablet Take 1 tablet by mouth every 4 (four) hours as needed. 30 tablet 0   pantoprazole (PROTONIX) 40  MG tablet Take 30- 60 min before your first and last meals of the day     pravastatin (PRAVACHOL) 40 MG tablet Take 40 mg by mouth at bedtime.     predniSONE (DELTASONE) 5 MG tablet Take 1 tablet (5 mg total) by mouth daily with breakfast. 100 tablet 1   pregabalin (LYRICA) 75 MG capsule Take 75 mg  by mouth 2 (two) times daily.     Spacer/Aero-Holding Chambers DEVI 1 each by Does not apply route daily as needed. 1 each 2   sulfamethoxazole-trimethoprim (BACTRIM DS) 800-160 MG tablet Take 1 tablet by mouth 2 (two) times daily. 14 tablet 0   SUMAtriptan 6 MG/0.5ML SOAJ Inject 6 mg as directed daily as needed (migraine).      Triamcinolone Acetonide (NASACORT ALLERGY 24HR NA) Place 2 sprays into the nose daily as needed (allergies).     valACYclovir (VALTREX) 500 MG tablet Take 500-2,000 mg by mouth See admin instructions. Take 2000mg s at first sign of fever blister outbreak then take 500mg s daily until gone     No current facility-administered medications for this visit.    Allergies: Allergies  Allergen Reactions   Aspirin Shortness Of Breath    Causes asthma flares    Penicillins Other (See Comments)    UNSPECIFIED REACTION OF CHILDHOOD  Has patient had a PCN reaction causing immediate rash, facial/tongue/throat swelling, SOB or lightheadedness with hypotension: NO Has patient had a PCN reaction causing severe rash involving mucus membranes or skin necrosis: NO Has patient had a PCN reaction that required hospitalization: no Has patient had a PCN reaction occurring within the last 10 years: NO If all of the above answers are "NO", then may proceed with Cephalosporin use.    Vicodin [Hydrocodone-Acetaminophen] Other (See Comments)    Headaches     Past Medical History:  Diagnosis Date   Anemia    Anxiety    Arthritis    Asthma    Cervical radiculopathy    Cluster headaches    COPD (chronic obstructive pulmonary disease) (HCC)    COPD (chronic obstructive pulmonary disease) with chronic bronchitis    GERD (gastroesophageal reflux disease)    Hyperlipemia    Hypoglycemia    Hypothyroidism    Migraine    Pneumothorax    PONV (postoperative nausea and vomiting)    Pre-diabetes    Rectal discharge 07/28/2010   Shortness of breath    Sleep apnea    cannot tolerate-not  using CPAP   Sluggishness 07/28/2010   Past Surgical History:  Procedure Laterality Date   ABDOMINAL HYSTERECTOMY     with bladder suspension   CERVICAL FUSION     CHOLECYSTECTOMY     COLONOSCOPY N/A 07/30/2013   Procedure: COLONOSCOPY;  Surgeon: Malissa Hippo, MD;  Location: [facility];  Service: Endoscopy;  Laterality: N/A;  930   EXAM UNDER ANESTHESIA WITH MANIPULATION OF SHOULDER Right 10/11/2016   Procedure: EXAM UNDER ANESTHESIA WITH MANIPULATION OF SHOULDER;  Surgeon: Vickki Hearing, MD;  Location: [facility];  Service: Orthopedics;  Laterality: Right;   HERNIA REPAIR     INCISIONAL HERNIA REPAIR N/A 07/30/2016   Procedure: HERNIA REPAIR INCISIONAL WITH MESH;  Surgeon: [name], MD;  Location: [facility];  Service: General;  Laterality: N/A;   INTERSTIM IMPLANT PLACEMENT  02/02/2021   Procedure: Leane Platt IMPLANT FIRST STAGE LEFT SACRUM ;  Surgeon: Malen Gauze, MD;  Location: [facility];  Service: Urology;;   Leane Platt IMPLANT PLACEMENT N/A 02/23/2021  Procedure: INTERSTIM IMPLANT SECOND STAGE;  Surgeon: Malen Gauze, MD;  Location: [facility];  Service: Urology;  Laterality: N/A;  Rep coming, time needs to stay at 2:00   INTERSTIM IMPLANT REVISION N/A 10/25/2022   Procedure: REVISION OF Leane Platt;  Surgeon: Malen Gauze, MD;  Location: [facility];  Service: Urology;  Laterality: N/A;   JOINT REPLACEMENT     LOBECTOMY     right lung    POSTERIOR CERVICAL LAMINECTOMY Right 04/05/2017   Procedure: Right C6-7 Posterior cervical laminectomy;  Surgeon: Donalee Citrin, MD;  Location: [facility];  Service: Neurosurgery;  Laterality: Right;  Right C6-7 Posterior cervical laminectomy   SHOULDER ARTHROSCOPY WITH ROTATOR CUFF REPAIR Right 10/11/2016   Procedure: SHOULDER ARTHROSCOPY;  Surgeon: Vickki Hearing, MD;  Location: [facility];  Service: Orthopedics;  Laterality: Right;   TOTAL HIP ARTHROPLASTY Right 04/15/2020   Procedure: RIGHT TOTAL HIP ARTHROPLASTY ANTERIOR APPROACH;   Surgeon: Kathryne Hitch, MD;  Location: [facility];  Service: Orthopedics;  Laterality: Right;   TOTAL HIP ARTHROPLASTY Left 07/22/2020   Procedure: LEFT  HIP ARTHROPLASTY ANTERIOR APPROACH;  Surgeon: Kathryne Hitch, MD;  Location: [facility];  Service: Orthopedics;  Laterality: Left;  RNFA   TUBAL LIGATION     Family History  Problem Relation Age of Onset   Neuropathy Mother    Migraines Mother    Cancer Mother    COPD Mother    Diabetes Mother    Hyperlipidemia Mother    Hypertension Mother    Hyperlipidemia Father    Hypertension Father    Migraines Sister    Migraines Maternal Aunt    Stroke Maternal Grandmother    Heart failure Maternal Grandmother    Hypertension Maternal Grandmother    Thyroid disease Maternal Grandmother    Diabetes Paternal Grandmother    Alzheimer's disease Paternal Grandmother    Stroke Paternal Grandfather    Heart failure Paternal Grandfather    Hypertension Paternal Grandfather    Bipolar disorder Son    Colon cancer Neg Hx    Social History   Socioeconomic History   Marital status: Married    Spouse name: Not on file   Number of children: Not on file   Years of education: Not on file   Highest education level: Not on file  Occupational History   Not on file  Tobacco Use   Smoking status: Former    Current packs/day: 0.00    Average packs/day: 1 pack/day for 30.0 years (30.0 ttl pk-yrs)    Types: Cigarettes    Start date: 02/20/1978    Quit date: 02/21/2008    Years since quitting: 14.7   Smokeless tobacco: Never  Vaping Use   Vaping status: Never Used  Substance and Sexual Activity   Alcohol use: No    Alcohol/week: 0.0 standard drinks of alcohol    Comment: no   Drug use: No   Sexual activity: Not Currently    Partners: Male    Birth control/protection: Surgical  Other Topics Concern   Not on file  Social History Narrative   Are you right handed or left handed? Right handed   Are you currently employed ?     What is your current occupation?   Do you live at home alone? NO    Who lives with you? Son   What type of home do you live in: 1 story or 2 story? 1       Social Determinants of Health  Financial Resource Strain: Not on file  Food Insecurity: Not on file  Transportation Needs: Not on file  Physical Activity: Not on file  Stress: Not on file  Social Connections: Not on file  Intimate Partner Violence: Not on file    Review of Systems Constitutional: Patient ***denies any unintentional weight loss or change in strength lntegumentary: Patient ***denies any rashes or pruritus Eyes: Patient denies ***dry eyes ENT: Patient ***denies dry mouth Cardiovascular: Patient ***denies chest pain or syncope Respiratory: Patient ***denies shortness of breath Gastrointestinal: Patient ***denies nausea, vomiting, constipation, or diarrhea Musculoskeletal: Patient ***denies muscle cramps or weakness Neurologic: Patient ***denies convulsions or seizures Psychiatric: Patient ***denies memory problems Allergic/Immunologic: Patient ***denies recent allergic reaction(s) Hematologic/Lymphatic: Patient denies bleeding tendencies Endocrine: Patient ***denies heat/cold intolerance  GU: As per HPI.  OBJECTIVE There were no vitals filed for this visit. There is no height or weight on file to calculate BMI.  Physical Examination  Constitutional: ***No obvious distress; patient is ***non-toxic appearing  Cardiovascular: ***No visible lower extremity edema.  Respiratory: The patient does ***not have audible wheezing/stridor; respirations do ***not appear labored  Gastrointestinal: Abdomen ***non-distended Musculoskeletal: ***Normal ROM of UEs  Skin: ***No obvious rashes/open sores  Neurologic: CN 2-12 grossly ***intact Psychiatric: Answered questions ***appropriately with ***normal affect  Hematologic/Lymphatic/Immunologic: ***No obvious bruises or sites of spontaneous bleeding  UA: {Desc;  negative/positive:13464} for *** WBC/hpf, *** RBC/hpf, bacteria (***) PVR: *** ml  ASSESSMENT No diagnosis found. ***  Will plan for follow up in *** months / ***1 year or sooner if needed. Pt verbalized understanding and agreement. All questions were answered.  PLAN Advised the following: 1. *** 2. ***No follow-ups on file.  No orders of the defined types were placed in this encounter.   It has been explained that the patient is to follow regularly with their PCP in addition to all other providers involved in their care and to follow instructions provided by these respective offices. Patient advised to contact urology clinic if any urologic-pertaining questions, concerns, new symptoms or problems arise in the interim period.  There are no Patient Instructions on file for this visit.  Electronically signed by:  Donnita Falls, FNP   11/19/22    7:46 AM

## 2022-11-22 ENCOUNTER — Ambulatory Visit: Payer: Medicare Other | Admitting: Urology

## 2022-11-23 ENCOUNTER — Encounter: Payer: Self-pay | Admitting: "Endocrinology

## 2022-11-26 ENCOUNTER — Other Ambulatory Visit: Payer: Self-pay | Admitting: "Endocrinology

## 2022-11-26 DIAGNOSIS — E039 Hypothyroidism, unspecified: Secondary | ICD-10-CM

## 2022-11-30 LAB — COMPREHENSIVE METABOLIC PANEL
Albumin: 4.5 g/dL (ref 3.8–4.9)
Bilirubin Total: 0.2 mg/dL (ref 0.0–1.2)
Potassium: 3.9 mmol/L (ref 3.5–5.2)

## 2022-12-03 ENCOUNTER — Other Ambulatory Visit: Payer: Self-pay | Admitting: "Endocrinology

## 2022-12-03 MED ORDER — SYNTHROID 88 MCG PO TABS: 88.0000 ug | ORAL_TABLET | Freq: Every day | ORAL | 1 refills | Status: DC

## 2022-12-05 ENCOUNTER — Other Ambulatory Visit: Payer: Self-pay

## 2022-12-05 ENCOUNTER — Encounter: Payer: Self-pay | Admitting: Allergy & Immunology

## 2022-12-05 ENCOUNTER — Ambulatory Visit (INDEPENDENT_AMBULATORY_CARE_PROVIDER_SITE_OTHER): Payer: Medicare Other | Admitting: Allergy & Immunology

## 2022-12-05 VITALS — BP 128/80 | HR 78 | Temp 98.7°F | Resp 16 | Ht 64.0 in | Wt 141.4 lb

## 2022-12-05 DIAGNOSIS — F1920 Other psychoactive substance dependence, uncomplicated: Secondary | ICD-10-CM | POA: Diagnosis not present

## 2022-12-05 DIAGNOSIS — Z9981 Dependence on supplemental oxygen: Secondary | ICD-10-CM

## 2022-12-05 DIAGNOSIS — J4550 Severe persistent asthma, uncomplicated: Secondary | ICD-10-CM | POA: Diagnosis not present

## 2022-12-05 DIAGNOSIS — J3089 Other allergic rhinitis: Secondary | ICD-10-CM | POA: Diagnosis not present

## 2022-12-05 DIAGNOSIS — D849 Immunodeficiency, unspecified: Secondary | ICD-10-CM

## 2022-12-05 DIAGNOSIS — J302 Other seasonal allergic rhinitis: Secondary | ICD-10-CM

## 2022-12-05 NOTE — Progress Notes (Signed)
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 Asthma-COPD overlap syndrome - Lung testing looks great today!  - I am so impressed!  - Daily controller medication(s): Breztri two puffs TWICE DAILY WITH SPACER + prednisone 5mg /7.5mg  daily + Singulair 10mg  daily + Fasenra every 8 weeks - Rescue medications: albuterol 4 puffs every 4-6 hours as needed or albuterol nebulizer one vial every 4-6 hours as needed - Asthma control goals:  * Full participation in all desired activities (may need albuterol before activity) * Albuterol use two time or less a week on average (not counting use with activity) * Cough interfering with sleep two time or less a month * Oral steroids no more than once a year * No hospitalizations   2. Chronic rhinitis (horse, tobacco leaf, grasses, ragweed, trees, indoor molds, outdoor molds, dust mites, cat and dog) - Continue with: Zyrtec (cetirizine) 10mg  tablet once daily and Singulair (montelukast) 10mg  daily and Nasacort (triamcinolone) two sprays per nostril daily AS NEEDED - Continue with nasal saline rinses.  3. Return in about 4 months (around 04/06/2023).    Subjective:   [name] is a 57 y.o. female presenting today for follow up of  Chief Complaint  Patient presents with   Asthma    No issues     [name]  has a history of the following: Patient Active Problem List   Diagnosis Date Noted   Urinary urgency 11/08/2022   Urge incontinence 11/08/2022   Nocturia 11/08/2022   S/P implantation of urinary electronic stimulator device 11/08/2022   Mixed hyperlipidemia 10/10/2022   Hardening of the aorta (main artery of the heart) (HCC) 09/10/2022   Easy bruising 07/31/2022   Overactive bladder 06/30/2022   Severe persistent asthma without complication 06/06/2022   Gait abnormality 05/28/2022   Steroid-induced adrenal suppression (HCC) 03/30/2022   Minimal cognitive impairment 03/19/2022   Moderate recurrent major depression (HCC) 10/23/2021   Osteopenia 10/23/2021   Acid reflux 10/23/2021   Endocrine disorder, unspecified 07/04/2021   Prediabetes 07/04/2021   Hypothyroidism 07/04/2021   Former smoker 06/29/2021   Seasonal and perennial allergic rhinitis 06/19/2021   Oxygen dependent 06/19/2021   Steroid dependent (HCC) 06/19/2021   Immunosuppressed status (HCC) 06/19/2021   Major depressive disorder 02/16/2021   Radiculopathy, lumbar region 12/08/2020   Chronic obstructive pulmonary disease (HCC) 10/19/2020   Anxiety 10/13/2020   Status post left hip replacement 07/22/2020   Status post right hip replacement 05/02/2020   Status post total replacement of right hip 04/15/2020   Avascular necrosis of bone of left hip (HCC) 02/10/2020   Avascular necrosis of bone of right hip (HCC) 02/10/2020   Allergic rhinitis 05/21/2019   Asthma-COPD overlap syndrome 04/30/2019   Chronic respiratory failure with hypoxia (HCC) 04/30/2019   Spinal stenosis of cervical region 04/05/2017   Partial tear of  right subscapularis tendon    Incisional hernia, without obstruction or gangrene    Urinary frequency 03/21/2012   Migraine equivalent syndrome 03/21/2012   Insomnia 03/21/2012    History obtained from: chart review and patient.  [name] is a 57 y.o. female presenting for a follow up visit.  She  was last seen in February 2024.  At that time, we continue with montelukast as well as Breztri 2 puffs twice daily.  He was also continued on Fasenra as well as prednisone 5 mg daily.  She was following with Dr. Fransico Him for help with prednisone weaning.  For her allergic rhinitis, we continued with Nasacort as well as cetirizine and nasal saline rinses.  Since last visit, she has done well.  Asthma/Respiratory Symptom History: She reports that she has been doing well. She remains on the oxygen 3 L per minute. This has been stable for her.  Last Harrington Challenger was around July or so. Her next one is September 23rd. She is using her rescue inhaler very rarely. She remains on the Breztri tow puffs twice daily. She reports that this is covered well. Prednisone remains on 5 mg daily. She hsa not needed to bump that up at all. She sees. Dr. Fransico Him twice per year.   She saw Dr. Vassie Loll in December 2023.  He felt that oxygen saturation appeared okay.  She was asked to stay off oxygen during the daytime.  She is not sleeping well at all. She sleeps for 2-3 hours and then intermittently through the rest of the night. She is unsure of the cause of the sleep problems. She wakes up multiple times during the night. She was on Ativan for her anxiety, but there were concerns that this might be contributing to new onset dementia, possibly, although she does not have the diagnosis. She was sleeping badly before this.   She does have a diagnosis of OSA, but she never used her CPAP. She never was able to tolerate it. She is certainly not a candidate for :Inspire.    She had some blood work done to look at his thyroid. She was feeling very cold and more tired than normal which is why she had this rechecked. He increased her thyroid medication. She had thyroid medication changed to of the Synthroid. Her TSH was 123 and free T4 was 0.10. He is not planning to decrease her prednisone any longer. She has not started the new medication.    Allergic Rhinitis Symptom History: She remains on the Zyrtec and the Singulair.  She also is on Nasacort 2 sprays per nostril as needed.  She uses nasal saline rinses on a as needed basis.  She has not been on antibiotics since last time we saw her.  She has a history of an overactive bladder and actually has a stimulator in place.  She sees Dr. Wilkie Aye with urology for this.  Otherwise, there have been no changes to her past medical history, surgical history, family history, or social history.    Review of systems otherwise negative other than that mentioned in the HPI.    Objective:   Blood pressure 128/80, pulse 78, temperature 98.7 F (37.1 C), resp. rate 16, height 5\' 4"  (1.626 m), weight 141 lb 6.4 oz (64.1 kg), SpO2 97%. Body mass index is 24.27 kg/m.    Physical Exam Vitals reviewed.  Constitutional:      Appearance: She is well-developed.     Comments: Wearing oxygen.  Smiling.  Cooperative with the  exam.  She is able to answer questions and is interactive. She is able to communicate in full sentences.   HENT:     Head: Normocephalic and atraumatic.     Right Ear: Tympanic membrane, ear canal and external ear normal.     Left Ear: Tympanic membrane, ear canal and external ear normal.     Nose: Mucosal edema and rhinorrhea present. No nasal deformity or septal deviation.     Right Turbinates: Enlarged, swollen and pale.     Left Turbinates: Enlarged, swollen and pale.     Right Sinus: No maxillary sinus tenderness or frontal sinus tenderness.     Left Sinus: No maxillary sinus tenderness or frontal sinus tenderness.     Comments: Nares dry from the oxygen cannula.  No nasal polyps. Some clear rhinorrhea bilaterally.     Mouth/Throat:     Mouth: Mucous membranes are not pale and not dry.     Pharynx: Uvula midline.  Eyes:     General: Lids are normal. Allergic shiner present.        Right eye: No discharge.        Left eye: No discharge.      Conjunctiva/sclera: Conjunctivae normal.     Right eye: Right conjunctiva is not injected. No chemosis.    Left eye: Left conjunctiva is not injected. No chemosis.    Pupils: Pupils are equal, round, and reactive to light.  Cardiovascular:     Rate and Rhythm: Normal rate and regular rhythm.     Heart sounds: Normal heart sounds.  Pulmonary:     Effort: Pulmonary effort is normal. Tachypnea present. No accessory muscle usage or respiratory distress.     Breath sounds: Normal breath sounds. No wheezing, rhonchi or rales.     Comments: Moving air well in all lung fields. No increased work of breathing noted.  Chest:     Chest wall: No tenderness.  Lymphadenopathy:     Cervical: No cervical adenopathy.  Skin:    General: Skin is warm.     Capillary Refill: Capillary refill takes less than 2 seconds.     Coloration: Skin is not pale.     Findings: No abrasion, erythema, petechiae or rash. Rash is not papular, urticarial or vesicular.     Comments: No eczematous lesions noted.   Neurological:     Mental Status: She is alert.  Psychiatric:        Behavior: Behavior is cooperative.      Diagnostic studies:    Spirometry: results normal (FEV1: 1.88/74%, FVC: 2.44/76%, FEV1/FVC: 77%).    Spirometry consistent with normal pattern.   Allergy Studies: none        Malachi Bonds, MD  [facility]

## 2022-12-05 NOTE — Patient Instructions (Addendum)
1. Asthma-COPD overlap syndrome - Lung testing looks great today!  - I am so impressed!  - Daily controller medication(s): Breztri two puffs TWICE DAILY WITH SPACER + prednisone 5mg /7.5mg  daily + Singulair 10mg  daily + Fasenra every 8 weeks - Rescue medications: albuterol 4 puffs every 4-6 hours as needed or albuterol nebulizer one vial every 4-6 hours as needed - Asthma control goals:  * Full participation in all desired activities (may need albuterol before activity) * Albuterol use two time or less a week on average (not counting use with activity) * Cough interfering with sleep two time or less a month * Oral steroids no more than once a year * No hospitalizations   2. Chronic rhinitis (horse, tobacco leaf, grasses, ragweed, trees, indoor molds, outdoor molds, dust mites, cat and dog) - Continue with: Zyrtec (cetirizine) 10mg  tablet once daily and Singulair (montelukast) 10mg  daily and Nasacort (triamcinolone) two sprays per nostril daily AS NEEDED - Continue with nasal saline rinses.  3. Return in about 4 months (around 04/06/2023).    Please inform us of any Emergency Department visits, hospitalizations, or changes in symptoms. Call us before going to the ED for breathing or allergy symptoms since we might be able to fit you in for a sick visit. Feel free to contact us anytime with any questions, problems, or concerns.  It was a pleasure to see you again today!    Websites that have reliable patient information: 1. American Academy of Asthma, Allergy, and Immunology: www.aaaai.org 2. Food Allergy Research and Education (FARE): foodallergy.org 3. Mothers of Asthmatics: http://www.asthmacommunitynetwork.org 4. American College of Allergy, Asthma, and Immunology: www.acaai.org   COVID-19 Vaccine Information can be found at: [email or web address] For questions related to vaccine distribution or appointments, please email  [email or web address] or call [phone].   We realize that you might be concerned about having an allergic reaction to the COVID19 vaccines. To help with that concern, WE ARE OFFERING THE COVID19 VACCINES IN OUR OFFICE! Ask the front desk for dates!     "Like" [place] on Facebook and Instagram for our latest updates!      A healthy democracy works best when [employer] participate! Make sure you are registered to vote! If you have moved or changed any of your contact information, you will need to get this updated before voting!  In some cases, you MAY be able to register to vote online: [email or web address]       utdoor molds, dust mites, cat and dog) - Continue with: Zyrtec (cetirizine) 10mg  tablet once daily and Singulair (montelukast) 10mg  daily and Nasacort (triamcinolone) two sprays per nostril daily AS NEEDED - Continue with nasal saline rinses.  3. Return in about 4 months (around 04/06/2023). You can have the follow up appointment with Dr. Dellis Anes or a Nurse Practicioner (our Nurse Practitioners are excellent and always have Physician oversight!).    Please inform us of any Emergency Department visits, hospitalizations, or changes in symptoms. Call us before going to the ED for breathing or allergy symptoms since we might be able to fit you in for a sick visit. Feel free to contact us anytime with any questions, problems, or concerns.  It was a pleasure to see you and your family again today!  Websites that have reliable patient information: 1. American Academy of Asthma, Allergy, and Immunology: www.aaaai.org 2. Food Allergy Research and Education (FARE): foodallergy.org 3. Mothers of Asthmatics: http://www.asthmacommunitynetwork.org 4. American College of Allergy, Asthma, and Immunology: www.acaai.org   COVID-19 Vaccine Information can be found  at: [email or web address] For questions  related to vaccine distribution or appointments, please email [email or web address] or call [phone].   We realize that you might be concerned about having an allergic reaction to the COVID19 vaccines. To help with that concern, WE ARE OFFERING THE COVID19 VACCINES IN OUR OFFICE! Ask the front desk for dates!     "Like" [place] on Facebook and Instagram for our latest updates!      A healthy democracy works best when [employer] participate! Make sure you are registered to vote! If you have moved or changed any of your contact information, you will need to get this updated before voting! Scan the QR codes below to learn more!

## 2022-12-07 ENCOUNTER — Encounter: Payer: Self-pay | Admitting: Allergy & Immunology

## 2022-12-11 ENCOUNTER — Encounter: Payer: Self-pay | Admitting: Pulmonary Disease

## 2022-12-11 ENCOUNTER — Ambulatory Visit (INDEPENDENT_AMBULATORY_CARE_PROVIDER_SITE_OTHER): Payer: Medicare Other | Admitting: Pulmonary Disease

## 2022-12-11 VITALS — BP 123/81 | HR 74 | Ht 64.0 in | Wt 142.7 lb

## 2022-12-11 DIAGNOSIS — J4489 Other specified chronic obstructive pulmonary disease: Secondary | ICD-10-CM | POA: Diagnosis not present

## 2022-12-11 DIAGNOSIS — T380X5A Adverse effect of glucocorticoids and synthetic analogues, initial encounter: Secondary | ICD-10-CM

## 2022-12-11 DIAGNOSIS — E2740 Unspecified adrenocortical insufficiency: Secondary | ICD-10-CM

## 2022-12-11 DIAGNOSIS — J9611 Chronic respiratory failure with hypoxia: Secondary | ICD-10-CM

## 2022-12-11 NOTE — Assessment & Plan Note (Signed)
I have asked her to try and stay off oxygen.  Husband reports that if she stays off for long ,then saturation drops down to 87%

## 2022-12-11 NOTE — Patient Instructions (Addendum)
X AMb sat  We discussed pulm rehab program  Flu & RSV shots recommended

## 2022-12-11 NOTE — Assessment & Plan Note (Signed)
Defer to endocrine whether prednisone can be tapered further

## 2022-12-11 NOTE — Progress Notes (Signed)
Subjective:    Patient ID: [name], female    DOB: 02/08/1966, 57 y.o.   MRN: [id number]  HPI  88 yowf ex smoker for FU of asthma/COPD overlap.  She was previously following with my partner Dr. Sherene Sires She has chronic hypoxic respiratory failure on oxygen since 2020 at 4 L and chronic rhinitis Quit smoking 2009 - 30 PYrs She reports asthma onset since 1995 -has Variability in her FEV1 on serial spirometry and positive bronchodilator response on previous PFTs - 12/26/20 started Fasenra   PMH -  -Tension Pneumothorax 12/2001 s/p VATS w/ mechanical pleurodesis on right -Dr. Maren Beach  -Multiple complications of chronic prednisone use, including avascular necrosis , total hip replacement, Osteopenia -Adrenal insufficiency - Dr Fransico Him - Seasonal and perennial allergic rhinitis (horse, tobacco leaf, grasses, ragweed, trees, indoor molds, outdoor molds, dust mites, cat and dog) Dr. Dellis Anes     Meds - Likes stiolto but can't afford on her insurance and has also tried /failed trelegy  due to either cost or upper airway side effects   41-month follow-up visit Last office visit she did not desaturate on walking. She arrives accompanied by her husband on POC. Interim: She had balance issues and was diagnosed with vestibular dysfunction underwent 4 months of therapy and is still not improved She ambulates with a cane.  Breathing has been doing well, spirometry was much improved, reviewed allergy consultation from August. She had 1 flareup requiring prednisone She rarely needs her nebulizer, compliant with Breztri. She is compliant with Fasenra injections every 8 weeks She remains on 5 mg of prednisone a day and thinks that this cannot be tapered further   Significant tests/ events reviewed     12/01/2021 ambulatory sat decreased from 95 to 92% on room air, reported shortness of breath   CTA chest 10/2021 neg 08/2021 LDCT Mild centrilobular and paraseptal emphysematous changes, upper  lung predominant.  Scattered small pulmonary nodules largest RML 5 mm   11/17/2019   Walked 4lpm her POC  approx   300 ft  @ slow/[place]  stopped due to  Sob and R hip and leg pain with sats still 98%      - 03/06/20 IgE  1115  IgE 05/27/2018   3590 with Eosinophil count 800/mcL > [place]    AAT1 - MM phenotype with level of 141      PFTs 07/2019 moderate airflow obstruction and restriction with FEV1 at 62%, ratio 66, FVC 74%, post BD FEV1 79%significant bronchodilator response with positive reversibility, 27% change, significant mid flow reversibility (137% change), DLCO improved at 65%.   Spirometry 06/2021 -ratio 72, FEV1 74%, FVC 82% 10/2021 -ratio 66, FEV1 61%, FVC 73% 11/2021-ratio 75, FEV1 60%, FVC 71% 01/2022 -spirometry -FEV1 62%  Spirometry 11/2022 ratio 77, FEV1 74%, FVC 76%  Review of Systems neg for any significant sore throat, dysphagia, itching, sneezing, nasal congestion or excess/ purulent secretions, fever, chills, sweats, unintended wt loss, pleuritic or exertional cp, hempoptysis, orthopnea pnd or change in chronic leg swelling. Also denies presyncope, palpitations, heartburn, abdominal pain, nausea, vomiting, diarrhea or change in bowel or urinary habits, dysuria,hematuria, rash, arthralgias, visual complaints, headache, numbness weakness or ataxia.     Objective:   Physical Exam  Gen. Pleasant, well-nourished, in no distress ENT - no thrush, no pallor/icterus,no post nasal drip Neck: No JVD, no thyromegaly, no carotid bruits Lungs: no use of accessory muscles, no dullness to percussion, clear without rales or rhonchi  Cardiovascular: Rhythm regular, heart sounds  normal, no murmurs or gallops, no peripheral edema Musculoskeletal: No deformities, no cyanosis or clubbing  Amb with cane       Assessment & Plan:

## 2022-12-11 NOTE — Assessment & Plan Note (Signed)
Lung function has improved and I feel that she has predominant asthma with mild emphysema component. She will continue on Breztri, use albuterol for rescue. She will continue on Fasenra every 8 weeks.  This is really helped decrease exacerbations

## 2022-12-19 ENCOUNTER — Other Ambulatory Visit (HOSPITAL_COMMUNITY): Payer: Self-pay

## 2022-12-19 NOTE — Progress Notes (Deleted)
Name: [name] DOB: [birth year] MRN: [id number]  History of Present Illness: [name] is a 57 y.o. female who presents today for follow up visit at [facility] [place]. - GU history: 1. OAB with urinary frequency, nocturia, urgency, and urge incontinence. - She has an InterStim sacral neuromodulation device. Implanted by Dr. Ronne Binning on 02/23/2021; lead & battery were both replaced on 10/25/2022 due to non-functioning. - Previously failed trials of Myrbetriq, Detrol, Vesicare, and Toviaz. 2. Stress urinary incontinence (resolved). Mid-urethral sling placed by Dr. Jerre Simon in 2008.   At last visit with Dr. Ronne Binning on 11/02/2022: Seen for irritation around the incision. No acute findings per note.   ***implant site skin check  Today: She {Actions; [id number] redness, warmth, tenderness, swelling, or drainage at the InterStim implant site(s).   She reports {Blank multiple:19197::"improved","persistent / unchanged"} urinary ***frequency, ***nocturia, ***urgency, and ***urge incontinence. Voiding ***x/day and ***x/night on average. Leaking ***x/day on average; using *** ***pads / ***diapers per day on average.  She {Actions; denies-reports:120008} dysuria, gross hematuria, straining to void, or sensations of incomplete emptying.   Fall Screening: Do you usually have a device to assist in your mobility? {yes/no:20286} ***cane / ***walker / ***wheelchair   Medications: Current Outpatient Medications  Medication Sig Dispense Refill   acetaminophen (TYLENOL) 500 MG tablet Take 1,000 mg by mouth every 6 (six) hours as needed for moderate pain.     albuterol (PROVENTIL) (2.5 MG/3ML) 0.083% nebulizer solution USE 1 VIAL IN NEBULIZER EVERY 6 HOURS AS NEEDED FOR WHEEZING OR SHORTNESS OF BREATH 75 mL 0   albuterol (VENTOLIN HFA) 108 (90 Base) MCG/ACT inhaler Inhale 2 puffs into the lungs every 4 (four) hours as needed for shortness of breath. 2 each 2    benralizumab (FASENRA PEN) 30 MG/ML prefilled autoinjector Inject 1 mL (30 mg total) into the skin every 8 (eight) weeks. 1 mL 6   Budeson-Glycopyrrol-Formoterol (BREZTRI AEROSPHERE) 160-9-4.8 MCG/ACT AERO Inhale 2 puffs into the lungs in the morning and at bedtime. 3 each 4   cetirizine (ZYRTEC) 10 MG tablet Take 10 mg by mouth daily.     Cholecalciferol (VITAMIN D3 SUPER STRENGTH PO) Take 5,000 Units by mouth daily.     citalopram (CELEXA) 20 MG tablet Take 20 mg by mouth daily.     hydroxypropyl methylcellulose / hypromellose (ISOPTO TEARS / GONIOVISC) 2.5 % ophthalmic solution Place 1 drop into both eyes as needed for dry eyes.     Lancets (ONETOUCH DELICA PLUS LANCET33G) MISC Apply topically.     latanoprost (XALATAN) 0.005 % ophthalmic solution Place 1 drop into both eyes at bedtime.     montelukast (SINGULAIR) 10 MG tablet Take 1 tablet (10 mg total) by mouth at bedtime. 90 tablet 1   Multiple Vitamin (MULTIVITAMIN WITH MINERALS) TABS tablet Take 1 tablet by mouth daily.     pantoprazole (PROTONIX) 40 MG tablet Take 30- 60 min before your first and last meals of the day     pravastatin (PRAVACHOL) 40 MG tablet Take 40 mg by mouth at bedtime.     predniSONE (DELTASONE) 5 MG tablet Take 1 tablet (5 mg total) by mouth daily with breakfast. 100 tablet 1   pregabalin (LYRICA) 75 MG capsule Take 75 mg by mouth 2 (two) times daily.     Spacer/Aero-Holding Chambers DEVI 1 each by Does not apply route daily as needed. 1 each 2   SUMAtriptan 6 MG/0.5ML SOAJ Inject 6 mg as directed daily as needed (migraine).  SYNTHROID 88 MCG tablet Take 1 tablet (88 mcg total) by mouth daily before breakfast. 90 tablet 1   Triamcinolone Acetonide (NASACORT ALLERGY 24HR NA) Place 2 sprays into the nose daily as needed (allergies).     valACYclovir (VALTREX) 500 MG tablet Take 500-2,000 mg by mouth See admin instructions. Take 2000mg s at first sign of fever blister outbreak then take 500mg s daily until gone      No current facility-administered medications for this visit.    Allergies: Allergies  Allergen Reactions   Aspirin Shortness Of Breath    Causes asthma flares    Penicillins Other (See Comments)    UNSPECIFIED REACTION OF CHILDHOOD  Has patient had a PCN reaction causing immediate rash, facial/tongue/throat swelling, SOB or lightheadedness with hypotension: NO Has patient had a PCN reaction causing severe rash involving mucus membranes or skin necrosis: NO Has patient had a PCN reaction that required hospitalization: no Has patient had a PCN reaction occurring within the last 10 years: NO If all of the above answers are "NO", then may proceed with Cephalosporin use.    Vicodin [Hydrocodone-Acetaminophen] Other (See Comments)    Headaches     Past Medical History:  Diagnosis Date   Anemia    Anxiety    Arthritis    Asthma    Cervical radiculopathy    Cluster headaches    COPD (chronic obstructive pulmonary disease) (HCC)    COPD (chronic obstructive pulmonary disease) with chronic bronchitis    GERD (gastroesophageal reflux disease)    Hyperlipemia    Hypoglycemia    Hypothyroidism    Migraine    Pneumothorax    PONV (postoperative nausea and vomiting)    Pre-diabetes    Rectal discharge 07/28/2010   Shortness of breath    Sleep apnea    cannot tolerate-not using CPAP   Sluggishness 07/28/2010   Past Surgical History:  Procedure Laterality Date   ABDOMINAL HYSTERECTOMY     with bladder suspension   CERVICAL FUSION     CHOLECYSTECTOMY     COLONOSCOPY N/A 07/30/2013   Procedure: COLONOSCOPY;  Surgeon: Malissa Hippo, MD;  Location: [facility];  Service: Endoscopy;  Laterality: N/A;  930   EXAM UNDER ANESTHESIA WITH MANIPULATION OF SHOULDER Right 10/11/2016   Procedure: EXAM UNDER ANESTHESIA WITH MANIPULATION OF SHOULDER;  Surgeon: Vickki Hearing, MD;  Location: [facility];  Service: Orthopedics;  Laterality: Right;   HERNIA REPAIR     INCISIONAL HERNIA  REPAIR N/A 07/30/2016   Procedure: HERNIA REPAIR INCISIONAL WITH MESH;  Surgeon: [name], MD;  Location: [facility];  Service: General;  Laterality: N/A;   INTERSTIM IMPLANT PLACEMENT  02/02/2021   Procedure: Leane Platt IMPLANT FIRST STAGE LEFT SACRUM ;  Surgeon: Malen Gauze, MD;  Location: [facility];  Service: Urology;;   Leane Platt IMPLANT PLACEMENT N/A 02/23/2021   Procedure: Leane Platt IMPLANT SECOND STAGE;  Surgeon: Malen Gauze, MD;  Location: [facility];  Service: Urology;  Laterality: N/A;  Rep coming, time needs to stay at 2:00   INTERSTIM IMPLANT REVISION N/A 10/25/2022   Procedure: REVISION OF Leane Platt;  Surgeon: Malen Gauze, MD;  Location: [facility];  Service: Urology;  Laterality: N/A;   JOINT REPLACEMENT     LOBECTOMY     right lung    POSTERIOR CERVICAL LAMINECTOMY Right 04/05/2017   Procedure: Right C6-7 Posterior cervical laminectomy;  Surgeon: Donalee Citrin, MD;  [facility];  Service: Neurosurgery;  Laterality: Right;  Right C6-7 Posterior cervical  laminectomy   SHOULDER ARTHROSCOPY WITH ROTATOR CUFF REPAIR Right 10/11/2016   Procedure: SHOULDER ARTHROSCOPY;  Surgeon: Vickki Hearing, MD;  Location: [facility];  Service: Orthopedics;  Laterality: Right;   TOTAL HIP ARTHROPLASTY Right 04/15/2020   Procedure: RIGHT TOTAL HIP ARTHROPLASTY ANTERIOR APPROACH;  Surgeon: Kathryne Hitch, MD;  Location: [facility];  Service: Orthopedics;  Laterality: Right;   TOTAL HIP ARTHROPLASTY Left 07/22/2020   Procedure: LEFT  HIP ARTHROPLASTY ANTERIOR APPROACH;  Surgeon: Kathryne Hitch, MD;  Location: [facility];  Service: Orthopedics;  Laterality: Left;  RNFA   TUBAL LIGATION     Family History  Problem Relation Age of Onset   Neuropathy Mother    Migraines Mother    Cancer Mother    COPD Mother    Diabetes Mother    Hyperlipidemia Mother    Hypertension Mother    Hyperlipidemia Father    Hypertension Father    Migraines Sister    Migraines Maternal Aunt     Stroke Maternal Grandmother    Heart failure Maternal Grandmother    Hypertension Maternal Grandmother    Thyroid disease Maternal Grandmother    Diabetes Paternal Grandmother    Alzheimer's disease Paternal Grandmother    Stroke Paternal Grandfather    Heart failure Paternal Grandfather    Hypertension Paternal Grandfather    Bipolar disorder Son    Colon cancer Neg Hx    Social History   Socioeconomic History   Marital status: Married    Spouse name: Not on file   Number of children: Not on file   Years of education: Not on file   Highest education level: Not on file  Occupational History   Not on file  Tobacco Use   Smoking status: Former    Current packs/day: 0.00    Average packs/day: 1 pack/day for 30.0 years (30.0 ttl pk-yrs)    Types: Cigarettes    Start date: 02/20/1978    Quit date: 02/21/2008    Years since quitting: 14.8   Smokeless tobacco: Never  Vaping Use   Vaping status: Never Used  Substance and Sexual Activity   Alcohol use: No    Alcohol/week: 0.0 standard drinks of alcohol    Comment: no   Drug use: No   Sexual activity: Not Currently    Partners: Male    Birth control/protection: Surgical  Other Topics Concern   Not on file  Social History Narrative   Are you right handed or left handed? Right handed   Are you currently employed ?    What is your current occupation?   Do you live at home alone? NO    Who lives with you? Son   What type of home do you live in: 1 story or 2 story? 1       Social Determinants of Corporate investment banker Strain: Not on file  Food Insecurity: Not on file  Transportation Needs: Not on file  Physical Activity: Not on file  Stress: Not on file  Social Connections: Not on file  Intimate Partner Violence: Not on file    Review of Systems Constitutional: Patient ***denies any unintentional weight loss or change in strength lntegumentary: Patient ***denies any rashes or pruritus Eyes: Patient denies  ***dry eyes ENT: Patient ***denies dry mouth Cardiovascular: Patient ***denies chest pain or syncope Respiratory: Patient ***denies shortness of breath Gastrointestinal: Patient ***denies nausea, vomiting, constipation, or diarrhea Musculoskeletal: Patient ***denies muscle cramps or weakness Neurologic: Patient ***denies convulsions or  seizures Psychiatric: Patient ***denies memory problems Allergic/Immunologic: Patient ***denies recent allergic reaction(s) Hematologic/Lymphatic: Patient denies bleeding tendencies Endocrine: Patient ***denies heat/cold intolerance  GU: As per HPI.  OBJECTIVE There were no vitals filed for this visit. There is no height or weight on file to calculate BMI.  Physical Examination Constitutional: ***No obvious distress; patient is ***non-toxic appearing  Cardiovascular: ***No visible lower extremity edema.  Respiratory: The patient does ***not have audible wheezing/stridor; respirations do ***not appear labored  Gastrointestinal: Abdomen ***non-distended Musculoskeletal: ***Normal ROM of UEs  Skin: ***No obvious rashes/open sores  Neurologic: CN 2-12 grossly ***intact Psychiatric: Answered questions ***appropriately with ***normal affect  Hematologic/Lymphatic/Immunologic: ***No obvious bruises or sites of spontaneous bleeding  UA: ***negative / *** WBC/hpf, *** RBC/hpf, bacteria (***) PVR: *** ml  ASSESSMENT No diagnosis found. *** We discussed the symptoms of overactive bladder (OAB), which include urinary urgency, frequency, nocturia, with or without urge incontinence.   While we may not know the exact etiology of OAB, several risk factors can be identified.  - We discussed this patient's neurogenic risk factors for OAB-type symptoms including ***T2DM, ***nicotine use, ***.  - Likely exacerbated by ***diuretic use, ***caffeine intake, ***consumption of bladder irritants such as (acidic foods, spicy foods, alcohol).   We discussed the following  management options in detail including potential benefits, risks, and side effects: Behavioral therapy: Modify fluid intake Decreasing bladder irritants (such as caffeine, acidic foods, spicy foods, alcohol) Urge suppression strategies Bladder retraining / timed voiding Double voiding Medication(s): - For anticholinergic medications, we discussed the potential side effects of anticholinergics including dry eyes, dry mouth, constipation, cognitive impairment and urinary retention.  - For beta-3 agonist medication, we discussed the risk for urinary retention and the potential side effect of elevated blood pressure specific to Myrbetriq (which is more likely to occur in individuals with uncontrolled hypertension).  For refractory cases: PTNS (posterior tibial nerve stimulation) Sacral neuromodulation trial (Medtronic lnterStim or Axonics implant) Bladder Botox injections In extreme cases, SP tube placement  ***In order to further evaluate urinary incontinence, She was instructed to complete a 3-day bladder diary.  ***Discussed recommendation for urodynamic testing, which was described in detail including risks such as bleeding, infection, organ/tissue/nerve damage.  She decided to proceed with *** ***work on behavioral modifications including ***minimizing caffeine intake and working on ***timed voiding.  Will plan for follow up in ***8 weeks / *** months / ***1 year or sooner if needed. Pt verbalized understanding and agreement. All questions were answered.  PLAN Advised the following: *** ***. Minimize caffeine intake. ***. Work on timed voiding. ***. Urge suppression strategies. ***. Double/ triple voiding. ***. No follow-ups on file.  No orders of the defined types were placed in this encounter.   It has been explained that the patient is to follow regularly with their PCP in addition to all other providers involved in their care and to follow instructions provided by these  respective offices. Patient advised to contact urology clinic if any urologic-pertaining questions, concerns, new symptoms or problems arise in the interim period.  There are no Patient Instructions on file for this visit.  Electronically signed by:  Donnita Falls, FNP   12/19/22    9:18 AM

## 2022-12-22 ENCOUNTER — Other Ambulatory Visit: Payer: Self-pay | Admitting: Family Medicine

## 2022-12-24 ENCOUNTER — Ambulatory Visit: Payer: Medicare Other | Admitting: Urology

## 2022-12-24 DIAGNOSIS — N3941 Urge incontinence: Secondary | ICD-10-CM

## 2022-12-24 DIAGNOSIS — Z960 Presence of urogenital implants: Secondary | ICD-10-CM

## 2022-12-24 DIAGNOSIS — R351 Nocturia: Secondary | ICD-10-CM

## 2022-12-24 DIAGNOSIS — R350 Frequency of micturition: Secondary | ICD-10-CM

## 2022-12-24 DIAGNOSIS — N3281 Overactive bladder: Secondary | ICD-10-CM

## 2022-12-24 DIAGNOSIS — R3915 Urgency of urination: Secondary | ICD-10-CM

## 2022-12-25 ENCOUNTER — Telehealth: Payer: Self-pay

## 2022-12-25 ENCOUNTER — Other Ambulatory Visit: Payer: Self-pay | Admitting: "Endocrinology

## 2022-12-25 NOTE — Telephone Encounter (Signed)
-----   Message from Marquis Lunch sent at 12/25/2022  8:46 AM EDT ----- I want this patient to revert back to her Levothyroxine 88 mcg po daily and reched her f/u to 4 weeks with pre-visit TSH and free T4. ----- Message ----- From: Derrell Lolling, LPN Sent: 04/14/1094   9:07 AM EDT To: Roma Kayser, MD

## 2022-12-25 NOTE — Telephone Encounter (Signed)
Left a message requesting pt return call to the office. 

## 2022-12-25 NOTE — Telephone Encounter (Signed)
Spoke with pt advising her to take her Synthroid daily and schedule a follow up appointment for 4 weeks with pre-visit TSH and Free T4 per Dr.Nida's orders. Pt voiced understanding.

## 2022-12-25 NOTE — Progress Notes (Signed)
Left a message requesting pt return call to the office.

## 2022-12-25 NOTE — Telephone Encounter (Signed)
Pt is scheduled °

## 2022-12-31 NOTE — Progress Notes (Unsigned)
Name: [name] DOB: April 23, 1965 MRN: [id number]  History of Present Illness: [name] is a 57 y.o. female who presents today for follow up visit at [facility] [place]. - GU history: 1. OAB with urinary frequency, nocturia, urgency, and urge incontinence. - Previously failed trials of Myrbetriq, Detrol, Vesicare, and Toviaz. - InterStim sacral neuromodulation device implanted by Dr. Ronne Binning on 02/23/2021; lead & battery were both replaced on 10/25/2022 due to non-functioning. 2. Stress urinary incontinence (resolved). Mid-urethral sling placed by Dr. Jerre Simon in 2008.   At last visit with Dr. Ronne Binning on 11/02/2022: Seen for irritation around the incision. No acute findings per note.   ***implant site skin check *** pt to bring her programmer  Today: She {Actions; denies-reports:120008} redness, warmth, tenderness, swelling, or drainage at the InterStim implant site(s).   She reports {Blank multiple:19197::"improved","persistent / unchanged"} urinary ***frequency, ***nocturia, ***urgency, and ***urge incontinence. Voiding ***x/day and ***x/night on average. Leaking ***x/day on average; using *** ***pads / ***diapers per day on average.  She {Actions; denies-reports:120008} dysuria, gross hematuria, straining to void, or sensations of incomplete emptying.   Fall Screening: Do you usually have a device to assist in your mobility? {yes/no:20286} ***cane / ***walker / ***wheelchair   Medications: Current Outpatient Medications  Medication Sig Dispense Refill   acetaminophen (TYLENOL) 500 MG tablet Take 1,000 mg by mouth every 6 (six) hours as needed for moderate pain.     albuterol (PROVENTIL) (2.5 MG/3ML) 0.083% nebulizer solution USE 1 VIAL IN NEBULIZER EVERY 6 HOURS AS NEEDED FOR WHEEZING OR SHORTNESS OF BREATH 75 mL 0   albuterol (VENTOLIN HFA) 108 (90 Base) MCG/ACT inhaler Inhale 2 puffs into the lungs every 4 (four) hours as needed for shortness of breath.  2 each 2   benralizumab (FASENRA PEN) 30 MG/ML prefilled autoinjector Inject 1 mL (30 mg total) into the skin every 8 (eight) weeks. 1 mL 6   Budeson-Glycopyrrol-Formoterol (BREZTRI AEROSPHERE) 160-9-4.8 MCG/ACT AERO Inhale 2 puffs into the lungs in the morning and at bedtime. 3 each 4   cetirizine (ZYRTEC) 10 MG tablet Take 10 mg by mouth daily.     Cholecalciferol (VITAMIN D3 SUPER STRENGTH PO) Take 5,000 Units by mouth daily.     citalopram (CELEXA) 20 MG tablet Take 20 mg by mouth daily.     hydroxypropyl methylcellulose / hypromellose (ISOPTO TEARS / GONIOVISC) 2.5 % ophthalmic solution Place 1 drop into both eyes as needed for dry eyes.     Lancets (ONETOUCH DELICA PLUS LANCET33G) MISC Apply topically.     latanoprost (XALATAN) 0.005 % ophthalmic solution Place 1 drop into both eyes at bedtime.     montelukast (SINGULAIR) 10 MG tablet TAKE 1 TABLET BY MOUTH AT BEDTIME 90 tablet 0   Multiple Vitamin (MULTIVITAMIN WITH MINERALS) TABS tablet Take 1 tablet by mouth daily.     pantoprazole (PROTONIX) 40 MG tablet Take 30- 60 min before your first and last meals of the day     pravastatin (PRAVACHOL) 40 MG tablet Take 40 mg by mouth at bedtime.     predniSONE (DELTASONE) 5 MG tablet Take 1 tablet (5 mg total) by mouth daily with breakfast. 100 tablet 1   pregabalin (LYRICA) 75 MG capsule Take 75 mg by mouth 2 (two) times daily.     Spacer/Aero-Holding Chambers DEVI 1 each by Does not apply route daily as needed. 1 each 2   SUMAtriptan 6 MG/0.5ML SOAJ Inject 6 mg as directed daily as needed (migraine).  SYNTHROID 88 MCG tablet Take 1 tablet (88 mcg total) by mouth daily before breakfast. 90 tablet 1   Triamcinolone Acetonide (NASACORT ALLERGY 24HR NA) Place 2 sprays into the nose daily as needed (allergies).     valACYclovir (VALTREX) 500 MG tablet Take 500-2,000 mg by mouth See admin instructions. Take 2000mg s at first sign of fever blister outbreak then take 500mg s daily until gone     No  current facility-administered medications for this visit.    Allergies: Allergies  Allergen Reactions   Aspirin Shortness Of Breath    Causes asthma flares    Penicillins Other (See Comments)    UNSPECIFIED REACTION OF CHILDHOOD  Has patient had a PCN reaction causing immediate rash, facial/tongue/throat swelling, SOB or lightheadedness with hypotension: NO Has patient had a PCN reaction causing severe rash involving mucus membranes or skin necrosis: NO Has patient had a PCN reaction that required hospitalization: no Has patient had a PCN reaction occurring within the last 10 years: NO If all of the above answers are "NO", then may proceed with Cephalosporin use.    Vicodin [Hydrocodone-Acetaminophen] Other (See Comments)    Headaches     Past Medical History:  Diagnosis Date   Anemia    Anxiety    Arthritis    Asthma    Cervical radiculopathy    Cluster headaches    COPD (chronic obstructive pulmonary disease) (HCC)    COPD (chronic obstructive pulmonary disease) with chronic bronchitis    GERD (gastroesophageal reflux disease)    Hyperlipemia    Hypoglycemia    Hypothyroidism    Migraine    Pneumothorax    PONV (postoperative nausea and vomiting)    Pre-diabetes    Rectal discharge 07/28/2010   Shortness of breath    Sleep apnea    cannot tolerate-not using CPAP   Sluggishness 07/28/2010   Past Surgical History:  Procedure Laterality Date   ABDOMINAL HYSTERECTOMY     with bladder suspension   CERVICAL FUSION     CHOLECYSTECTOMY     COLONOSCOPY N/A 07/30/2013   Procedure: COLONOSCOPY;  Surgeon: Malissa Hippo, MD;  Location: [facility];  Service: Endoscopy;  Laterality: N/A;  930   EXAM UNDER ANESTHESIA WITH MANIPULATION OF SHOULDER Right 10/11/2016   Procedure: EXAM UNDER ANESTHESIA WITH MANIPULATION OF SHOULDER;  Surgeon: Vickki Hearing, MD;  Location: [facility];  Service: Orthopedics;  Laterality: Right;   HERNIA REPAIR     INCISIONAL HERNIA REPAIR  N/A 07/30/2016   Procedure: HERNIA REPAIR INCISIONAL WITH MESH;  Surgeon: [name], MD;  Location: [facility];  Service: General;  Laterality: N/A;   INTERSTIM IMPLANT PLACEMENT  02/02/2021   Procedure: Leane Platt IMPLANT FIRST STAGE LEFT SACRUM ;  Surgeon: Malen Gauze, MD;  Location: [facility];  Service: Urology;;   Leane Platt IMPLANT PLACEMENT N/A 02/23/2021   Procedure: Leane Platt IMPLANT SECOND STAGE;  Surgeon: Malen Gauze, MD;  Location: [facility];  Service: Urology;  Laterality: N/A;  Rep coming, time needs to stay at 2:00   INTERSTIM IMPLANT REVISION N/A 10/25/2022   Procedure: REVISION OF Leane Platt;  Surgeon: Malen Gauze, MD;  Location: [facility];  Service: Urology;  Laterality: N/A;   JOINT REPLACEMENT     LOBECTOMY     right lung    POSTERIOR CERVICAL LAMINECTOMY Right 04/05/2017   Procedure: Right C6-7 Posterior cervical laminectomy;  Surgeon: Donalee Citrin, MD;  Location: [facility];  Service: Neurosurgery;  Laterality: Right;  Right C6-7 Posterior cervical  laminectomy   SHOULDER ARTHROSCOPY WITH ROTATOR CUFF REPAIR Right 10/11/2016   Procedure: SHOULDER ARTHROSCOPY;  Surgeon: Vickki Hearing, MD;  Location: [facility];  Service: Orthopedics;  Laterality: Right;   TOTAL HIP ARTHROPLASTY Right 04/15/2020   Procedure: RIGHT TOTAL HIP ARTHROPLASTY ANTERIOR APPROACH;  Surgeon: Kathryne Hitch, MD;  Location: [facility];  Service: Orthopedics;  Laterality: Right;   TOTAL HIP ARTHROPLASTY Left 07/22/2020   Procedure: LEFT  HIP ARTHROPLASTY ANTERIOR APPROACH;  Surgeon: Kathryne Hitch, MD;  Location: [facility];  Service: Orthopedics;  Laterality: Left;  RNFA   TUBAL LIGATION     Family History  Problem Relation Age of Onset   Neuropathy Mother    Migraines Mother    Cancer Mother    COPD Mother    Diabetes Mother    Hyperlipidemia Mother    Hypertension Mother    Hyperlipidemia Father    Hypertension Father    Migraines Sister    Migraines Maternal Aunt    Stroke  Maternal Grandmother    Heart failure Maternal Grandmother    Hypertension Maternal Grandmother    Thyroid disease Maternal Grandmother    Diabetes Paternal Grandmother    Alzheimer's disease Paternal Grandmother    Stroke Paternal Grandfather    Heart failure Paternal Grandfather    Hypertension Paternal Grandfather    Bipolar disorder Son    Colon cancer Neg Hx    Social History   Socioeconomic History   Marital status: Married    Spouse name: Not on file   Number of children: Not on file   Years of education: Not on file   Highest education level: Not on file  Occupational History   Not on file  Tobacco Use   Smoking status: Former    Current packs/day: 0.00    Average packs/day: 1 pack/day for 30.0 years (30.0 ttl pk-yrs)    Types: Cigarettes    Start date: 02/20/1978    Quit date: 02/21/2008    Years since quitting: 14.8   Smokeless tobacco: Never  Vaping Use   Vaping status: Never Used  Substance and Sexual Activity   Alcohol use: No    Alcohol/week: 0.0 standard drinks of alcohol    Comment: no   Drug use: No   Sexual activity: Not Currently    Partners: Male    Birth control/protection: Surgical  Other Topics Concern   Not on file  Social History Narrative   Are you right handed or left handed? Right handed   Are you currently employed ?    What is your current occupation?   Do you live at home alone? NO    Who lives with you? Son   What type of home do you live in: 1 story or 2 story? 1       Social Determinants of Corporate investment banker Strain: Not on file  Food Insecurity: Not on file  Transportation Needs: Not on file  Physical Activity: Not on file  Stress: Not on file  Social Connections: Not on file  Intimate Partner Violence: Not on file    Review of Systems Constitutional: Patient ***denies any unintentional weight loss or change in strength lntegumentary: Patient ***denies any rashes or pruritus Eyes: Patient denies ***dry  eyes ENT: Patient ***denies dry mouth Cardiovascular: Patient ***denies chest pain or syncope Respiratory: Patient ***denies shortness of breath Gastrointestinal: Patient ***denies nausea, vomiting, constipation, or diarrhea Musculoskeletal: Patient ***denies muscle cramps or weakness Neurologic: Patient ***denies convulsions or  seizures Psychiatric: Patient ***denies memory problems Allergic/Immunologic: Patient ***denies recent allergic reaction(s) Hematologic/Lymphatic: Patient denies bleeding tendencies Endocrine: Patient ***denies heat/cold intolerance  GU: As per HPI.  OBJECTIVE There were no vitals filed for this visit. There is no height or weight on file to calculate BMI.  Physical Examination Constitutional: ***No obvious distress; patient is ***non-toxic appearing  Cardiovascular: ***No visible lower extremity edema.  Respiratory: The patient does ***not have audible wheezing/stridor; respirations do ***not appear labored  Gastrointestinal: Abdomen ***non-distended Musculoskeletal: ***Normal ROM of UEs  Skin: ***No obvious rashes/open sores  Neurologic: CN 2-12 grossly ***intact Psychiatric: Answered questions ***appropriately with ***normal affect  Hematologic/Lymphatic/Immunologic: ***No obvious bruises or sites of spontaneous bleeding  UA: ***negative / *** WBC/hpf, *** RBC/hpf, bacteria (***) PVR: *** ml  ASSESSMENT No diagnosis found. ***  Will plan for follow up in *** months / ***1 year or sooner if needed. Pt verbalized understanding and agreement. All questions were answered.  PLAN Advised the following: 1. *** 2. ***No follow-ups on file.  No orders of the defined types were placed in this encounter.   It has been explained that the patient is to follow regularly with their PCP in addition to all other providers involved in their care and to follow instructions provided by these respective offices. Patient advised to contact urology clinic if any  urologic-pertaining questions, concerns, new symptoms or problems arise in the interim period.  There are no Patient Instructions on file for this visit.  Electronically signed by:  Donnita Falls, FNP   12/31/22    1:10 PM

## 2023-01-03 ENCOUNTER — Encounter: Payer: Self-pay | Admitting: Urology

## 2023-01-03 ENCOUNTER — Ambulatory Visit (INDEPENDENT_AMBULATORY_CARE_PROVIDER_SITE_OTHER): Payer: Medicare Other | Admitting: Urology

## 2023-01-03 VITALS — BP 109/63 | HR 80 | Temp 97.9°F

## 2023-01-03 DIAGNOSIS — Z960 Presence of urogenital implants: Secondary | ICD-10-CM

## 2023-01-03 DIAGNOSIS — N3281 Overactive bladder: Secondary | ICD-10-CM | POA: Diagnosis not present

## 2023-01-03 LAB — URINALYSIS, ROUTINE W REFLEX MICROSCOPIC
Bilirubin, UA: NEGATIVE
Glucose, UA: NEGATIVE
Ketones, UA: NEGATIVE
Leukocytes,UA: NEGATIVE
Nitrite, UA: NEGATIVE
Protein,UA: NEGATIVE
RBC, UA: NEGATIVE
Specific Gravity, UA: 1.01 (ref 1.005–1.030)
Urobilinogen, Ur: 0.2 mg/dL (ref 0.2–1.0)
pH, UA: 6.5 (ref 5.0–7.5)

## 2023-01-03 LAB — BLADDER SCAN AMB NON-IMAGING: Scan Result: 40

## 2023-01-16 ENCOUNTER — Other Ambulatory Visit: Payer: Self-pay

## 2023-01-16 DIAGNOSIS — E039 Hypothyroidism, unspecified: Secondary | ICD-10-CM

## 2023-01-16 DIAGNOSIS — R7303 Prediabetes: Secondary | ICD-10-CM

## 2023-01-22 ENCOUNTER — Encounter: Payer: Self-pay | Admitting: "Endocrinology

## 2023-01-22 LAB — T4, FREE: Free T4: 0.1 ng/dL — ABNORMAL LOW (ref 0.82–1.77)

## 2023-01-22 LAB — TSH: TSH: 127 u[IU]/mL — ABNORMAL HIGH (ref 0.450–4.500)

## 2023-01-24 ENCOUNTER — Ambulatory Visit (INDEPENDENT_AMBULATORY_CARE_PROVIDER_SITE_OTHER): Payer: Medicare Other | Admitting: "Endocrinology

## 2023-01-24 ENCOUNTER — Encounter: Payer: Self-pay | Admitting: "Endocrinology

## 2023-01-24 VITALS — BP 104/66 | HR 68 | Ht 64.0 in | Wt 145.2 lb

## 2023-01-24 DIAGNOSIS — R7303 Prediabetes: Secondary | ICD-10-CM | POA: Diagnosis not present

## 2023-01-24 DIAGNOSIS — E782 Mixed hyperlipidemia: Secondary | ICD-10-CM | POA: Diagnosis not present

## 2023-01-24 DIAGNOSIS — E039 Hypothyroidism, unspecified: Secondary | ICD-10-CM | POA: Diagnosis not present

## 2023-01-24 DIAGNOSIS — E2740 Unspecified adrenocortical insufficiency: Secondary | ICD-10-CM | POA: Diagnosis not present

## 2023-01-24 DIAGNOSIS — T380X5A Adverse effect of glucocorticoids and synthetic analogues, initial encounter: Secondary | ICD-10-CM

## 2023-01-24 LAB — POCT GLYCOSYLATED HEMOGLOBIN (HGB A1C): HbA1c, POC (controlled diabetic range): 5.6 % (ref 0.0–7.0)

## 2023-01-24 MED ORDER — TIROSINT 100 MCG PO CAPS: 100.0000 ug | ORAL_CAPSULE | Freq: Every day | ORAL | 1 refills | Status: DC

## 2023-01-24 NOTE — Progress Notes (Signed)
01/24/2023, 3:51 PM   Endocrinology follow-up note  Subjective:    Patient ID: [name], female    DOB: 11-03-1965, PCP Benita Stabile, MD   Past Medical History:  Diagnosis Date   Anemia    Anxiety    Arthritis    Asthma    Cervical radiculopathy    Cluster headaches    COPD (chronic obstructive pulmonary disease) (HCC)    COPD (chronic obstructive pulmonary disease) with chronic bronchitis (HCC)    GERD (gastroesophageal reflux disease)    Hyperlipemia    Hypoglycemia    Hypothyroidism    Migraine    Pneumothorax    PONV (postoperative nausea and vomiting)    Pre-diabetes    Rectal discharge 07/28/2010   Shortness of breath    Sleep apnea    cannot tolerate-not using CPAP   Sluggishness 07/28/2010   Past Surgical History:  Procedure Laterality Date   ABDOMINAL HYSTERECTOMY     with bladder suspension   CERVICAL FUSION     CHOLECYSTECTOMY     COLONOSCOPY N/A 07/30/2013   Procedure: COLONOSCOPY;  Surgeon: Malissa Hippo, MD;  [facility]: [facility];  Service: Endoscopy;  Laterality: N/A;  930   EXAM UNDER ANESTHESIA WITH MANIPULATION OF SHOULDER Right 10/11/2016   Procedure: EXAM UNDER ANESTHESIA WITH MANIPULATION OF SHOULDER;  Surgeon: Vickki Hearing, MD;  Location: [facility];  Service: Orthopedics;  Laterality: Right;   HERNIA REPAIR     INCISIONAL HERNIA REPAIR N/A 07/30/2016   Procedure: HERNIA REPAIR INCISIONAL WITH MESH;  Surgeon: [name], MD;  Location: [facility];  Service: General;  Laterality: N/A;   INTERSTIM IMPLANT PLACEMENT  02/02/2021   Procedure: Leane Platt IMPLANT FIRST STAGE LEFT SACRUM ;  Surgeon: Malen Gauze, MD;  Location: [facility];  Service: Urology;;   Leane Platt IMPLANT PLACEMENT N/A 02/23/2021   Procedure: Leane Platt IMPLANT SECOND STAGE;  Surgeon: Malen Gauze, MD;  Location: [facility];  Service: Urology;  Laterality: N/A;  Rep coming, time needs to stay at 2:00    INTERSTIM IMPLANT REVISION N/A 10/25/2022   Procedure: REVISION OF Leane Platt;  Surgeon: Malen Gauze, MD;  Location: [facility];  Service: Urology;  Laterality: N/A;   JOINT REPLACEMENT     LOBECTOMY     right lung    POSTERIOR CERVICAL LAMINECTOMY Right 04/05/2017   Procedure: Right C6-7 Posterior cervical laminectomy;  Surgeon: Donalee Citrin, MD;  Location: [facility];  Service: Neurosurgery;  Laterality: Right;  Right C6-7 Posterior cervical laminectomy   SHOULDER ARTHROSCOPY WITH ROTATOR CUFF REPAIR Right 10/11/2016   Procedure: SHOULDER ARTHROSCOPY;  Surgeon: Vickki Hearing, MD;  Location: [facility];  Service: Orthopedics;  Laterality: Right;   TOTAL HIP ARTHROPLASTY Right 04/15/2020   Procedure: RIGHT TOTAL HIP ARTHROPLASTY ANTERIOR APPROACH;  Surgeon: Kathryne Hitch, MD;  Location: [facility];  Service: Orthopedics;  Laterality: Right;   TOTAL HIP ARTHROPLASTY Left 07/22/2020   Procedure: LEFT  HIP ARTHROPLASTY ANTERIOR APPROACH;  Surgeon: Kathryne Hitch, MD;  Location: [facility];  Service: Orthopedics;  Laterality: Left;  RNFA   TUBAL LIGATION     Social History   Socioeconomic History   Marital status: Married  Spouse name: Not on file   Number of children: Not on file   Years of education: Not on file   Highest education level: Not on file  Occupational History   Not on file  Tobacco Use   Smoking status: Former    Current packs/day: 0.00    Average packs/day: 1 pack/day for 30.0 years (30.0 ttl pk-yrs)    Types: Cigarettes    Start date: 02/20/1978    Quit date: 02/21/2008    Years since quitting: 14.9   Smokeless tobacco: Never  Vaping Use   Vaping status: Never Used  Substance and Sexual Activity   Alcohol use: No    Alcohol/week: 0.0 standard drinks of alcohol    Comment: no   Drug use: No   Sexual activity: Not Currently    Partners: Male    Birth control/protection: Surgical  Other Topics Concern   Not on file  Social History Narrative   Are you  right handed or left handed? Right handed   Are you currently employed ?    What is your current occupation?   Do you live at home alone? NO    Who lives with you? Son   What type of home do you live in: 1 story or 2 story? 1       Social Determinants of Corporate investment banker Strain: Not on file  Food Insecurity: Not on file  Transportation Needs: Not on file  Physical Activity: Not on file  Stress: Not on file  Social Connections: Not on file   Family History  Problem Relation Age of Onset   Neuropathy Mother    Migraines Mother    Cancer Mother    COPD Mother    Diabetes Mother    Hyperlipidemia Mother    Hypertension Mother    Hyperlipidemia Father    Hypertension Father    Migraines Sister    Migraines Maternal Aunt    Stroke Maternal Grandmother    Heart failure Maternal Grandmother    Hypertension Maternal Grandmother    Thyroid disease Maternal Grandmother    Diabetes Paternal Grandmother    Alzheimer's disease Paternal Grandmother    Stroke Paternal Grandfather    Heart failure Paternal Grandfather    Hypertension Paternal Grandfather    Bipolar disorder Son    Colon cancer Neg Hx    Outpatient Encounter Medications as of 01/24/2023  Medication Sig   TIROSINT 100 MCG CAPS Take 1 capsule (100 mcg total) by mouth daily before breakfast.   acetaminophen (TYLENOL) 500 MG tablet Take 1,000 mg by mouth every 6 (six) hours as needed for moderate pain.   albuterol (PROVENTIL) (2.5 MG/3ML) 0.083% nebulizer solution USE 1 VIAL IN NEBULIZER EVERY 6 HOURS AS NEEDED FOR WHEEZING OR SHORTNESS OF BREATH   albuterol (VENTOLIN HFA) 108 (90 Base) MCG/ACT inhaler Inhale 2 puffs into the lungs every 4 (four) hours as needed for shortness of breath.   benralizumab (FASENRA PEN) 30 MG/ML prefilled autoinjector Inject 1 mL (30 mg total) into the skin every 8 (eight) weeks.   Budeson-Glycopyrrol-Formoterol (BREZTRI AEROSPHERE) 160-9-4.8 MCG/ACT AERO Inhale 2 puffs into the  lungs in the morning and at bedtime.   cetirizine (ZYRTEC) 10 MG tablet Take 10 mg by mouth daily.   Cholecalciferol (VITAMIN D3 SUPER STRENGTH PO) Take 5,000 Units by mouth daily.   citalopram (CELEXA) 20 MG tablet Take 20 mg by mouth daily.   hydroxypropyl methylcellulose / hypromellose (ISOPTO TEARS / GONIOVISC) 2.5 %  ophthalmic solution Place 1 drop into both eyes as needed for dry eyes.   Lancets (ONETOUCH DELICA PLUS LANCET33G) MISC Apply topically.   latanoprost (XALATAN) 0.005 % ophthalmic solution Place 1 drop into both eyes at bedtime.   montelukast (SINGULAIR) 10 MG tablet TAKE 1 TABLET BY MOUTH AT BEDTIME   Multiple Vitamin (MULTIVITAMIN WITH MINERALS) TABS tablet Take 1 tablet by mouth daily.   pantoprazole (PROTONIX) 40 MG tablet Take 30- 60 min before your first and last meals of the day   pravastatin (PRAVACHOL) 40 MG tablet Take 40 mg by mouth at bedtime.   predniSONE (DELTASONE) 5 MG tablet Take 1 tablet (5 mg total) by mouth daily with breakfast.   pregabalin (LYRICA) 75 MG capsule Take 75 mg by mouth 2 (two) times daily.   Spacer/Aero-Holding Chambers DEVI 1 each by Does not apply route daily as needed.   SUMAtriptan 6 MG/0.5ML SOAJ Inject 6 mg as directed daily as needed (migraine).    SYNTHROID 88 MCG tablet Take 1 tablet (88 mcg total) by mouth daily before breakfast. (Patient taking differently: Take 2 tablets by mouth daily before breakfast.)   Triamcinolone Acetonide (NASACORT ALLERGY 24HR NA) Place 2 sprays into the nose daily as needed (allergies).   valACYclovir (VALTREX) 500 MG tablet Take 500-2,000 mg by mouth See admin instructions. Take 2000mg s at first sign of fever blister outbreak then take 500mg s daily until gone   No facility-administered encounter medications on file as of 01/24/2023.   ALLERGIES: Allergies  Allergen Reactions   Aspirin Shortness Of Breath    Causes asthma flares    Penicillins Other (See Comments)    UNSPECIFIED REACTION OF  CHILDHOOD  Has patient had a PCN reaction causing immediate rash, facial/tongue/throat swelling, SOB or lightheadedness with hypotension: NO Has patient had a PCN reaction causing severe rash involving mucus membranes or skin necrosis: NO Has patient had a PCN reaction that required hospitalization: no Has patient had a PCN reaction occurring within the last 10 years: NO If all of the above answers are "NO", then may proceed with Cephalosporin use.    Vicodin [Hydrocodone-Acetaminophen] Other (See Comments)    Headaches     VACCINATION STATUS: Immunization History  Administered Date(s) Administered   Influenza,inj,Quad PF,6+ Mos 04/13/2021   Influenza,inj,Quad PF,6-35 Mos 01/28/2020   Influenza,inj,quad, With Preservative 02/07/2022   Influenza-Unspecified 02/15/2017, 04/13/2019, 04/13/2021   PFIZER(Purple Top)SARS-COV-2 Vaccination 06/21/2019, 07/22/2019, 03/09/2020   PNEUMOCOCCAL CONJUGATE-20 04/13/2021    HPI Roger Lacretia Nicks Laing is 57 y.o. female who presents today with a medical history as above. she is being seen in follow-up after she was seen in consultation for steroid induced adrenal suppression requested by Benita Stabile, MD.    See notes from her previous visit.  She has been treated with prednisone more or less continuously for the last 15 years.  -She is currently on a stable dose of prednisone 5 mg p.o. daily.  She denies any history of adrenal crisis. -She is also being treated for primary hypothyroidism for which she has received thyroid hormone replacement for several years.  She was doing relatively well on 88 mcg of Synthroid until July 2024 at which time her labs started to become abnormal for inadequate replacement with extremely high TSH 120+ associated with low free T4.   -She was advised to double her dose of Synthroid to 176 mcg (2 tablets of 88 mcg) daily for the last 3 days.  She presents with ongoing fatigue. -She is accompanied by  her husband to clinic.   She reports fatigue and cold intolerance.  She confirms to me appropriate intake of this medication. She takes her Synthroid daily on empty stomach at least half an hour before other medications or food.  In June 2024, her labs were consistent with low replacement on 88 mcg of Synthroid.   She has history of heavy smoking, complicated by COPD on ongoing inhalers including albuterol, budesonide, and Perforomist.  She is also on oxygen supplement 24/7. She denies any history of adrenal injury, penetrating abdominal trauma.  -  She also has hyperlipidemia and prediabetes.  - Her most recent bone density from March 25, 2019 was consistent with osteopenia of the spine, normal bone density on the left forearm radius.    Review of Systems  Constitutional: + Gained 7 pounds since last visit,   + fatigue, no subjective hyperthermia, no subjective hypothermia Eyes: no blurry vision, no xerophthalmia  Respiratory: + On continuous oxygen supplement.   Status post partial pneumonectomy in 2004.   Objective:       01/24/2023    9:23 AM 01/03/2023    2:08 PM 12/11/2022   10:04 AM  Vitals with BMI  Height 5\' 4"   5\' 4"   Weight 145 lbs 3 oz  142 lbs 11 oz  BMI 24.91  24.48  Systolic 104 109 161  Diastolic 66 63 81  Pulse 68 80 74    BP 104/66   Pulse 68   Ht 5\' 4"  (1.626 m)   Wt 145 lb 3.2 oz (65.9 kg)   BMI 24.92 kg/m   Wt Readings from Last 3 Encounters:  01/24/23 145 lb 3.2 oz (65.9 kg)  12/11/22 142 lb 11.2 oz (64.7 kg)  12/05/22 141 lb 6.4 oz (64.1 kg)    Physical Exam  Constitutional:  Body mass index is 24.92 kg/m.,  not in acute distress, normal state of mind Eyes: PERRLA, EOMI, no exophthalmos ENT: moist mucous membranes, no gross thyromegaly, no gross cervical lymphadenopathy   CMP ( most recent) CMP     Component Value Date/Time   NA 135 11/27/2022 0904   K 3.9 11/27/2022 0904   CL 96 11/27/2022 0904   CO2 25 11/27/2022 0904   GLUCOSE 76 11/27/2022 0904   GLUCOSE  104 (H) 10/30/2021 0958   BUN 10 11/27/2022 0904   CREATININE 1.34 (H) 11/27/2022 0904   CALCIUM 10.1 11/27/2022 0904   PROT 6.2 11/27/2022 0904   ALBUMIN 4.5 11/27/2022 0904   AST 24 11/27/2022 0904   ALT 14 11/27/2022 0904   ALKPHOS 42 (L) 11/27/2022 0904   BILITOT 0.2 11/27/2022 0904   GFRNONAA >60 10/30/2021 0958   GFRAA >60 03/06/2019 0931     Diabetic Labs (most recent): Lab Results  Component Value Date   HGBA1C 5.6 01/24/2023   HGBA1C 5.6 10/10/2022   HGBA1C 5.8 04/11/2022     Lab Results  Component Value Date   TSH 127.000 (H) 01/18/2023   TSH 123.000 (H) 11/27/2022   TSH 0.444 (L) 10/03/2022   TSH 0.129 (L) 04/05/2022   TSH 0.55 02/03/2022   TSH 7.410 (H) 09/25/2021   TSH 3.21 05/21/2006   FREET4 0.10 (L) 01/18/2023   FREET4 0.10 (L) 11/27/2022   FREET4 1.60 10/03/2022   FREET4 1.48 04/05/2022   FREET4 1.03 09/25/2021     Lipid Panel     Component Value Date/Time   CHOL 169 10/03/2022 0801   TRIG 135 10/03/2022 0801   HDL 64 10/03/2022 0801  CHOLHDL 2.6 10/03/2022 0801   LDLCALC 82 10/03/2022 0801   LABVLDL 23 10/03/2022 0801    Assessment & Plan:   1.  Hypothyroidism  2.  Steroid induced adrenal suppression  3.  Prediabetes 4.  Hyperlipidemia   - I have reviewed her recent and available endocrine records and clinically evaluated the patient.  Regarding her hypothyroidism, the circumstance of diagnosis not available to review.  In the short-term of events, her most recent thyroid function tests are consistent with inadequate replacement on 88 mcg of Synthroid.  It appears that she is not absorbing her hormone sufficiently.  She is at risk for imminent severe hypothyroidism/myxedema. She will be considered for Tirosint 100 mcg p.o. daily before breakfast.  In the meantime, she is advised to continue 176 mcg of Synthroid (2 tablets of 88 mcg) p.o. daily before breakfast.   - We discussed about the correct intake of her thyroid hormone, on empty  stomach at fasting, with water, separated by at least 30 minutes from breakfast and other medications,  and separated by more than 4 hours from calcium, iron, multivitamins, acid reflux medications (PPIs). -Patient is made aware of the fact that thyroid hormone replacement is needed for life, dose to be adjusted by periodic monitoring of thyroid function tests.   She will need repeat labs in 3-4 weeks which will include antithyroid antibodies and an effort to determine etiology behind her primary hypothyroidism.  - she has adrenal suppression due to chronic steroid treatment related to her COPD/asthma.  This patient will likely need lifelong replacement dose steroids, glucocorticoids for now.  She is advised to continue prednisone 5 mg p.o. daily at breakfast.    -Her blood pressure is stable at 104/66 today, if she develops hypotension, she will be considered for Florinef on subsequent visits.    It is unsafe, unnecessary to withdraw prednisone to test her for adrenal insufficiency.  -She wears a medical alert for  depicting her adrenal insufficiency and the need for steroids.   She has point-of-care A1c remains stable at 5.6%, she will not need intervention with medications and is negative  Regarding her dyslipidemia: She presents with improved lipid panel with LDL improving to 82 overall improving from 115.  She will continue to benefit from statin intervention.  I advised her to continue Pravachol 40 mg p.o. nightly.  Side effects and precautions discussed with her.     - she is advised to maintain close follow up with Benita Stabile, MD for primary care needs.   I spent  26  minutes in the care of the patient today including review of labs from Thyroid Function, CMP, and other relevant labs ; imaging/biopsy records (current and previous including abstractions from other facilities); face-to-face time discussing  her lab results and symptoms, medications doses, her options of short and long  term treatment based on the latest standards of care / guidelines;   and documenting the encounter.  [name]  participated in the discussions, expressed understanding, and voiced agreement with the above plans.  All questions were answered to her satisfaction. she is encouraged to contact clinic should she have any questions or concerns prior to her return visit.   Follow up plan: Return in about 5 weeks (around 02/28/2023) for Fasting Labs  in AM B4 8.   Marquis Lunch, MD [facility] [address] [place], [place] [address] Phone: [phone]  Fax: [phone]     01/24/2023, 3:51 PM  This note was partially dictated with voice recognition software. Similar sounding words can be transcribed inadequately or may not  be corrected upon review.

## 2023-01-28 NOTE — Telephone Encounter (Signed)
Spoke with patient, informed her that we did receive a few Covid Vaccines and was wondering if she was still interested in receiving the Vaccine. Patient informed me that she did receive the Covid and Flu Vaccine at [facility] last week.

## 2023-02-08 LAB — MICROALBUMIN / CREATININE URINE RATIO: Microalb Creat Ratio: 27

## 2023-02-08 LAB — LIPID PANEL
Cholesterol: 271 — AB (ref 0–200)
HDL: 87 — AB (ref 35–70)
LDL Cholesterol: 162
LDl/HDL Ratio: 3.1
Triglycerides: 126 (ref 40–160)

## 2023-02-08 LAB — BASIC METABOLIC PANEL
BUN: 15 (ref 4–21)
CO2: 24 — AB (ref 13–22)
Chloride: 102 (ref 99–108)
Creatinine: 1.6 — AB (ref 0.5–1.1)
EGFR: 36
Glucose: 72
Potassium: 4.7 meq/L (ref 3.5–5.1)
Sodium: 143 (ref 137–147)

## 2023-02-08 LAB — PROTEIN / CREATININE RATIO, URINE
Albumin, U: 45.8
Creatinine, Urine: 169.3

## 2023-02-08 LAB — COMPREHENSIVE METABOLIC PANEL
Albumin: 4.8 (ref 3.5–5.0)
Calcium: 9.5 (ref 8.7–10.7)
Globulin: 1.7

## 2023-02-08 LAB — HEMOGLOBIN A1C: Hemoglobin A1C: 5.7

## 2023-02-13 ENCOUNTER — Other Ambulatory Visit: Payer: Self-pay

## 2023-02-13 ENCOUNTER — Other Ambulatory Visit (HOSPITAL_COMMUNITY): Payer: Self-pay

## 2023-02-13 NOTE — Progress Notes (Signed)
Specialty Pharmacy Ongoing Clinical Assessment Note  [name] is a 57 y.o. female who is being followed by the specialty pharmacy service for RxSp Asthma/COPD   Patient's specialty medication(s) reviewed today: Benralizumab   Missed doses in the last 4 weeks: 0   Patient/Caregiver did not have any additional questions or concerns.   Therapeutic benefit summary: Patient is achieving benefit   Adverse events/side effects summary: No adverse events/side effects   Patient's therapy is appropriate to: Continue    Goals Addressed             This Visit's Progress    Reduce disease symptoms including coughing and shortness of breath       Patient is on track. Patient will maintain adherence         Follow up:  6 months  Otto Herb Specialty Pharmacist

## 2023-02-13 NOTE — Progress Notes (Signed)
Specialty Pharmacy Refill Coordination Note  [name] is a 57 y.o. female contacted today regarding refills of specialty medication(s) Benralizumab   Patient requested Delivery   Delivery date: 02/20/23   Verified address: [address]  [place] [address]   Medication will be filled on 02/19/23.

## 2023-02-14 ENCOUNTER — Encounter: Payer: Self-pay | Admitting: "Endocrinology

## 2023-02-19 LAB — LIPID PANEL
Chol/HDL Ratio: 2.9 {ratio} (ref 0.0–4.4)
Cholesterol, Total: 274 mg/dL — ABNORMAL HIGH (ref 100–199)
HDL: 93 mg/dL (ref >39)
LDL Chol Calc (NIH): 155 mg/dL — ABNORMAL HIGH (ref 0–99)
Triglycerides: 148 mg/dL (ref 0–149)
VLDL Cholesterol Cal: 26 mg/dL (ref 5–40)

## 2023-02-19 LAB — COMPREHENSIVE METABOLIC PANEL
ALT: 42 [IU]/L — ABNORMAL HIGH (ref 0–32)
AST: 56 [IU]/L — ABNORMAL HIGH (ref 0–40)
Albumin: 4.8 g/dL (ref 3.8–4.9)
Alkaline Phosphatase: 50 [IU]/L (ref 44–121)
BUN/Creatinine Ratio: 11 (ref 9–23)
BUN: 17 mg/dL (ref 6–24)
Bilirubin Total: 0.3 mg/dL (ref 0.0–1.2)
CO2: 25 mmol/L (ref 20–29)
Calcium: 9.7 mg/dL (ref 8.7–10.2)
Chloride: 99 mmol/L (ref 96–106)
Creatinine, Ser: 1.49 mg/dL — ABNORMAL HIGH (ref 0.57–1.00)
Globulin, Total: 2.1 g/dL (ref 1.5–4.5)
Glucose: 73 mg/dL (ref 70–99)
Potassium: 4.3 mmol/L (ref 3.5–5.2)
Sodium: 141 mmol/L (ref 134–144)
Total Protein: 6.9 g/dL (ref 6.0–8.5)
eGFR: 40 mL/min/{1.73_m2} — ABNORMAL LOW (ref >59)

## 2023-02-19 LAB — TSH: TSH: 121 u[IU]/mL — ABNORMAL HIGH (ref 0.450–4.500)

## 2023-02-19 LAB — THYROID PEROXIDASE ANTIBODY: Thyroperoxidase Ab SerPl-aCnc: 40 [IU]/mL — ABNORMAL HIGH (ref 0–34)

## 2023-02-19 LAB — VITAMIN D 25 HYDROXY (VIT D DEFICIENCY, FRACTURES): Vit D, 25-Hydroxy: 66.6 ng/mL (ref 30.0–100.0)

## 2023-02-19 LAB — T4, FREE: Free T4: 0.1 ng/dL — ABNORMAL LOW (ref 0.82–1.77)

## 2023-02-19 LAB — T3, FREE: T3, Free: <0.3 pg/mL — ABNORMAL LOW (ref 2.0–4.4)

## 2023-02-19 LAB — VITAMIN B12: Vitamin B-12: 665 pg/mL (ref 232–1245)

## 2023-02-19 LAB — THYROGLOBULIN ANTIBODY: Thyroglobulin Antibody: 1.2 [IU]/mL — ABNORMAL HIGH (ref 0.0–0.9)

## 2023-02-21 ENCOUNTER — Other Ambulatory Visit: Payer: Self-pay | Admitting: "Endocrinology

## 2023-02-21 ENCOUNTER — Encounter: Payer: Self-pay | Admitting: "Endocrinology

## 2023-02-21 DIAGNOSIS — E039 Hypothyroidism, unspecified: Secondary | ICD-10-CM

## 2023-02-21 DIAGNOSIS — E349 Endocrine disorder, unspecified: Secondary | ICD-10-CM

## 2023-02-21 MED ORDER — TIROSINT 150 MCG PO CAPS: 150.0000 ug | ORAL_CAPSULE | Freq: Every day | ORAL | 1 refills | Status: DC

## 2023-02-26 ENCOUNTER — Other Ambulatory Visit: Payer: Self-pay | Admitting: "Endocrinology

## 2023-02-26 MED ORDER — LIOTHYRONINE SODIUM 5 MCG PO TABS: 5.0000 ug | ORAL_TABLET | Freq: Every day | ORAL | 1 refills | Status: DC

## 2023-02-26 NOTE — Telephone Encounter (Signed)
Noted .Talked with the patient and I have sent Dr. Fransico Him a message.

## 2023-02-26 NOTE — Telephone Encounter (Signed)
Patient was called and given Dr. Isidoro Donning instruction. Patient states that it would cost her $500 for a 30 day supply for the Tirosint.Her CT is this Sunday , 03/03/23 at 3 pm and follow up appointment with Dr. Fransico Him is Tuesday at 3 pm.

## 2023-02-28 ENCOUNTER — Ambulatory Visit: Payer: Medicare Other | Admitting: "Endocrinology

## 2023-03-03 ENCOUNTER — Ambulatory Visit (HOSPITAL_COMMUNITY): Payer: Medicare Other

## 2023-03-04 ENCOUNTER — Telehealth: Payer: Self-pay | Admitting: "Endocrinology

## 2023-03-04 NOTE — Telephone Encounter (Signed)
Just got something in our fax que stating they need more information by 4pm. I emailed it to you

## 2023-03-04 NOTE — Telephone Encounter (Signed)
Patient called and said they moved her CT scan to 11/27. She is asking if it has been approved because at the time it was scheduled it was pending. Please let me know and I will call the patient thank you!!

## 2023-03-05 ENCOUNTER — Ambulatory Visit: Payer: Medicare Other | Admitting: "Endocrinology

## 2023-03-05 ENCOUNTER — Other Ambulatory Visit (HOSPITAL_COMMUNITY): Payer: Medicare Other

## 2023-03-06 ENCOUNTER — Ambulatory Visit (HOSPITAL_COMMUNITY)
Admission: RE | Admit: 2023-03-06 | Discharge: 2023-03-06 | Disposition: A | Discharge status: Home / Self Care | Payer: Medicare Other | Source: Ambulatory Visit | Attending: "Endocrinology | Admitting: "Endocrinology

## 2023-03-06 DIAGNOSIS — E349 Endocrine disorder, unspecified: Secondary | ICD-10-CM | POA: Diagnosis present

## 2023-03-06 DIAGNOSIS — I700 Atherosclerosis of aorta: Secondary | ICD-10-CM | POA: Diagnosis not present

## 2023-03-06 DIAGNOSIS — N200 Calculus of kidney: Secondary | ICD-10-CM | POA: Insufficient documentation

## 2023-03-11 ENCOUNTER — Ambulatory Visit (INDEPENDENT_AMBULATORY_CARE_PROVIDER_SITE_OTHER): Payer: Medicare Other | Admitting: "Endocrinology

## 2023-03-11 ENCOUNTER — Encounter: Payer: Self-pay | Admitting: "Endocrinology

## 2023-03-11 VITALS — BP 106/70 | HR 76 | Ht 64.0 in | Wt 145.8 lb

## 2023-03-11 DIAGNOSIS — T380X5A Adverse effect of glucocorticoids and synthetic analogues, initial encounter: Secondary | ICD-10-CM | POA: Diagnosis not present

## 2023-03-11 DIAGNOSIS — E782 Mixed hyperlipidemia: Secondary | ICD-10-CM

## 2023-03-11 DIAGNOSIS — E039 Hypothyroidism, unspecified: Secondary | ICD-10-CM

## 2023-03-11 DIAGNOSIS — E2740 Unspecified adrenocortical insufficiency: Secondary | ICD-10-CM | POA: Diagnosis not present

## 2023-03-11 MED ORDER — LEVOTHYROXINE SODIUM 200 MCG PO TABS: 200.0000 ug | ORAL_TABLET | Freq: Every day | ORAL | 1 refills | Status: DC

## 2023-03-11 MED ORDER — LIOTHYRONINE SODIUM 5 MCG PO TABS: 5.0000 ug | ORAL_TABLET | Freq: Two times a day (BID) | ORAL | 1 refills | Status: DC

## 2023-03-11 NOTE — Progress Notes (Signed)
03/11/2023, 12:55 PM   Endocrinology follow-up note  Subjective:    Patient ID: Loreta Ave, female    DOB: [birth year], PCP Benita Stabile, MD   Past Medical History:  Diagnosis Date   Anemia    Anxiety    Arthritis    Asthma    Cervical radiculopathy    Cluster headaches    COPD (chronic obstructive pulmonary disease) (HCC)    COPD (chronic obstructive pulmonary disease) with chronic bronchitis (HCC)    GERD (gastroesophageal reflux disease)    Hyperlipemia    Hypoglycemia    Hypothyroidism    Migraine    Pneumothorax    PONV (postoperative nausea and vomiting)    Pre-diabetes    Rectal discharge 07/28/2010   Shortness of breath    Sleep apnea    cannot tolerate-not using CPAP   Sluggishness 07/28/2010   Past Surgical History:  Procedure Laterality Date   ABDOMINAL HYSTERECTOMY     with bladder suspension   CERVICAL FUSION     CHOLECYSTECTOMY     COLONOSCOPY N/A 07/30/2013   Procedure: COLONOSCOPY;  Surgeon: Malissa Hippo, MD;  [facility];  Service: Endoscopy;  Laterality: N/A;  930   EXAM UNDER ANESTHESIA WITH MANIPULATION OF SHOULDER Right 10/11/2016   Procedure: EXAM UNDER ANESTHESIA WITH MANIPULATION OF SHOULDER;  Surgeon: Vickki Hearing, MD;  Location: [facility];  Service: Orthopedics;  Laterality: Right;   HERNIA REPAIR     INCISIONAL HERNIA REPAIR N/A 07/30/2016   Procedure: HERNIA REPAIR INCISIONAL WITH MESH;  Surgeon: [name], MD;  Location: [facility];  Service: General;  Laterality: N/A;   INTERSTIM IMPLANT PLACEMENT  02/02/2021   Procedure: Leane Platt IMPLANT FIRST STAGE LEFT SACRUM ;  Surgeon: Malen Gauze, MD;  Location: [facility];  Service: Urology;;   Leane Platt IMPLANT PLACEMENT N/A 02/23/2021   Procedure: Leane Platt IMPLANT SECOND STAGE;  Surgeon: Malen Gauze, MD;  Location: [facility];  Service: Urology;  Laterality: N/A;  Rep coming, time needs to stay at 2:00    INTERSTIM IMPLANT REVISION N/A 10/25/2022   Procedure: REVISION OF Leane Platt;  Surgeon: Malen Gauze, MD;  Location: [facility];  Service: Urology;  Laterality: N/A;   JOINT REPLACEMENT     LOBECTOMY     right lung    POSTERIOR CERVICAL LAMINECTOMY Right 04/05/2017   Procedure: Right C6-7 Posterior cervical laminectomy;  Surgeon: Donalee Citrin, MD;  Location: [facility] OR;  Service: Neurosurgery;  Laterality: Right;  Right C6-7 Posterior cervical laminectomy   SHOULDER ARTHROSCOPY WITH ROTATOR CUFF REPAIR Right 10/11/2016   Procedure: SHOULDER ARTHROSCOPY;  Surgeon: Vickki Hearing, MD;  Location: [facility];  Service: Orthopedics;  Laterality: Right;   TOTAL HIP ARTHROPLASTY Right 04/15/2020   Procedure: RIGHT TOTAL HIP ARTHROPLASTY ANTERIOR APPROACH;  Surgeon: Kathryne Hitch, MD;  Location: [facility];  Service: Orthopedics;  Laterality: Right;   TOTAL HIP ARTHROPLASTY Left 07/22/2020   Procedure: LEFT  HIP ARTHROPLASTY ANTERIOR APPROACH;  Surgeon: Kathryne Hitch, MD;  Location: [facility];  Service: Orthopedics;  Laterality: Left;  RNFA   TUBAL LIGATION     Social History   Socioeconomic History   Marital status: Married  Spouse name: Not on file   Number of children: Not on file   Years of education: Not on file   Highest education level: Not on file  Occupational History   Not on file  Tobacco Use   Smoking status: Former    Current packs/day: 0.00    Average packs/day: 1 pack/day for 30.0 years (30.0 ttl pk-yrs)    Types: Cigarettes    Start date: 02/20/1978    Quit date: 02/21/2008    Years since quitting: 15.0   Smokeless tobacco: Never  Vaping Use   Vaping status: Never Used  Substance and Sexual Activity   Alcohol use: No    Alcohol/week: 0.0 standard drinks of alcohol    Comment: no   Drug use: No   Sexual activity: Not Currently    Partners: Male    Birth control/protection: Surgical  Other Topics Concern   Not on file  Social History Narrative   Are you  right handed or left handed? Right handed   Are you currently employed ?    What is your current occupation?   Do you live at home alone? NO    Who lives with you? Son   What type of home do you live in: 1 story or 2 story? 1       Social Determinants of Corporate investment banker Strain: Not on file  Food Insecurity: Not on file  Transportation Needs: Not on file  Physical Activity: Not on file  Stress: Not on file  Social Connections: Not on file   Family History  Problem Relation Age of Onset   Neuropathy Mother    Migraines Mother    Cancer Mother    COPD Mother    Diabetes Mother    Hyperlipidemia Mother    Hypertension Mother    Hyperlipidemia Father    Hypertension Father    Migraines Sister    Migraines Maternal Aunt    Stroke Maternal Grandmother    Heart failure Maternal Grandmother    Hypertension Maternal Grandmother    Thyroid disease Maternal Grandmother    Diabetes Paternal Grandmother    Alzheimer's disease Paternal Grandmother    Stroke Paternal Grandfather    Heart failure Paternal Grandfather    Hypertension Paternal Grandfather    Bipolar disorder Son    Colon cancer Neg Hx    Outpatient Encounter Medications as of 03/11/2023  Medication Sig   acetaminophen (TYLENOL) 500 MG tablet Take 1,000 mg by mouth every 6 (six) hours as needed for moderate pain.   albuterol (PROVENTIL) (2.5 MG/3ML) 0.083% nebulizer solution USE 1 VIAL IN NEBULIZER EVERY 6 HOURS AS NEEDED FOR WHEEZING OR SHORTNESS OF BREATH   albuterol (VENTOLIN HFA) 108 (90 Base) MCG/ACT inhaler Inhale 2 puffs into the lungs every 4 (four) hours as needed for shortness of breath.   benralizumab (FASENRA PEN) 30 MG/ML prefilled autoinjector Inject 1 mL (30 mg total) into the skin every 8 (eight) weeks.   Budeson-Glycopyrrol-Formoterol (BREZTRI AEROSPHERE) 160-9-4.8 MCG/ACT AERO Inhale 2 puffs into the lungs in the morning and at bedtime.   cetirizine (ZYRTEC) 10 MG tablet Take 10 mg by mouth  daily.   Cholecalciferol (VITAMIN D3 SUPER STRENGTH PO) Take 5,000 Units by mouth daily.   citalopram (CELEXA) 20 MG tablet Take 20 mg by mouth daily.   hydroxypropyl methylcellulose / hypromellose (ISOPTO TEARS / GONIOVISC) 2.5 % ophthalmic solution Place 1 drop into both eyes as needed for dry eyes.   Lancets [facility]  DELICA PLUS LANCET33G) MISC Apply topically.   latanoprost (XALATAN) 0.005 % ophthalmic solution Place 1 drop into both eyes at bedtime.   levothyroxine (SYNTHROID) 200 MCG tablet Take 1 tablet (200 mcg total) by mouth daily before breakfast.   liothyronine (CYTOMEL) 5 MCG tablet Take 1 tablet (5 mcg total) by mouth 2 (two) times daily at 8 am and 10 pm.   montelukast (SINGULAIR) 10 MG tablet TAKE 1 TABLET BY MOUTH AT BEDTIME   Multiple Vitamin (MULTIVITAMIN WITH MINERALS) TABS tablet Take 1 tablet by mouth daily.   pantoprazole (PROTONIX) 40 MG tablet Take 30- 60 min before your first and last meals of the day   pravastatin (PRAVACHOL) 40 MG tablet Take 40 mg by mouth at bedtime.   predniSONE (DELTASONE) 5 MG tablet Take 1 tablet (5 mg total) by mouth daily with breakfast.   pregabalin (LYRICA) 75 MG capsule Take 75 mg by mouth 2 (two) times daily.   Spacer/Aero-Holding Chambers DEVI 1 each by Does not apply route daily as needed.   SUMAtriptan 6 MG/0.5ML SOAJ Inject 6 mg as directed daily as needed (migraine).    Triamcinolone Acetonide (NASACORT ALLERGY 24HR NA) Place 2 sprays into the nose daily as needed (allergies).   valACYclovir (VALTREX) 500 MG tablet Take 500-2,000 mg by mouth See admin instructions. Take 2000mg s at first sign of fever blister outbreak then take 500mg s daily until gone   [DISCONTINUED] levothyroxine (SYNTHROID) 88 MCG tablet Take 2 tablets by mouth daily before breakfast.   [DISCONTINUED] liothyronine (CYTOMEL) 5 MCG tablet Take 1 tablet (5 mcg total) by mouth daily before breakfast.   [DISCONTINUED] TIROSINT 150 MCG CAPS Take 1 capsule (150 mcg total)  by mouth daily before breakfast.   No facility-administered encounter medications on file as of 03/11/2023.   ALLERGIES: Allergies  Allergen Reactions   Aspirin Shortness Of Breath    Causes asthma flares    Penicillins Other (See Comments)    UNSPECIFIED REACTION OF CHILDHOOD  Has patient had a PCN reaction causing immediate rash, facial/tongue/throat swelling, SOB or lightheadedness with hypotension: NO Has patient had a PCN reaction causing severe rash involving mucus membranes or skin necrosis: NO Has patient had a PCN reaction that required hospitalization: no Has patient had a PCN reaction occurring within the last 10 years: NO If all of the above answers are "NO", then may proceed with Cephalosporin use.    Vicodin [Hydrocodone-Acetaminophen] Other (See Comments)    Headaches     VACCINATION STATUS: Immunization History  Administered Date(s) Administered   Influenza,inj,Quad PF,6+ Mos 04/13/2021   Influenza,inj,Quad PF,6-35 Mos 01/28/2020   Influenza,inj,quad, With Preservative 02/07/2022   Influenza-Unspecified 02/15/2017, 04/13/2019, 04/13/2021   PFIZER(Purple Top)SARS-COV-2 Vaccination 06/21/2019, 07/22/2019, 03/09/2020   PNEUMOCOCCAL CONJUGATE-20 04/13/2021    HPI Shannia Lacretia Nicks [place] is 57 y.o. female who presents today with a medical history as above. she is being seen in follow-up after she was seen in consultation for steroid induced adrenal suppression.  She also has hypothyroidism due to Hashimoto's thyroiditis.  More recently it has been difficult to regulate her thyroid function tests despite high dose of levothyroxine/Synthroid.    See notes from her previous visit.  She has been treated with prednisone more or less continuously for the last 15 years.  -She is currently on a stable dose of prednisone 5 mg p.o. daily.  She denies any history of adrenal crisis. -She is currently on levothyroxine 1 26 mcg of levothyroxine and Cytomel 5 mg p.o. daily at  breakfast.  Her previsit labs are still consistent with inadequate replacement.  She denies diarrhea nor vomiting.  She was offered surveillance CT abdomen/pelvis to rule out consumptive hypothyroidism which returns negative findings. -She is accompanied by her husband to clinic.  She reports fatigue and cold intolerance.  She confirms to me appropriate intake of this medication.  She could not afford the co-pays for Tirosint. She takes her Synthroid daily on empty stomach at least half an hour before other medications or food.      She has history of heavy smoking, complicated by COPD on ongoing inhalers including albuterol, budesonide, and Perforomist.  She is also on oxygen supplement 24/7. She denies any history of adrenal injury, penetrating abdominal trauma.  -  She also has hyperlipidemia and prediabetes.  - Her most recent bone density from March 25, 2019 was consistent with osteopenia of the spine, normal bone density on the left forearm radius.    Review of Systems  Constitutional: + Gained 7 pounds since last visit,   + fatigue, no subjective hyperthermia, no subjective hypothermia Eyes: no blurry vision, no xerophthalmia  Respiratory: + On continuous oxygen supplement.   Status post partial pneumonectomy in 2004.   Objective:       03/11/2023    9:21 AM 01/24/2023    9:23 AM 01/03/2023    2:08 PM  Vitals with BMI  Height 5\' 4"  5\' 4"    Weight 145 lbs 13 oz 145 lbs 3 oz   BMI 25.01 24.91   Systolic 106 104 098  Diastolic 70 66 63  Pulse 76 68 80    BP 106/70   Pulse 76   Ht 5\' 4"  (1.626 m)   Wt 145 lb 12.8 oz (66.1 kg)   BMI 25.03 kg/m   Wt Readings from Last 3 Encounters:  03/11/23 145 lb 12.8 oz (66.1 kg)  01/24/23 145 lb 3.2 oz (65.9 kg)  12/11/22 142 lb 11.2 oz (64.7 kg)    Physical Exam  Constitutional:  Body mass index is 25.03 kg/m.,  not in acute distress, normal state of mind Eyes: PERRLA, EOMI, no exophthalmos ENT: moist mucous membranes, no  gross thyromegaly, no gross cervical lymphadenopathy   CMP ( most recent) CMP     Component Value Date/Time   NA 141 02/14/2023 0835   K 4.3 02/14/2023 0835   CL 99 02/14/2023 0835   CO2 25 02/14/2023 0835   GLUCOSE 73 02/14/2023 0835   GLUCOSE 104 (H) 10/30/2021 0958   BUN 17 02/14/2023 0835   CREATININE 1.49 (H) 02/14/2023 0835   CALCIUM 9.7 02/14/2023 0835   PROT 6.9 02/14/2023 0835   ALBUMIN 4.8 02/14/2023 0835   AST 56 (H) 02/14/2023 0835   ALT 42 (H) 02/14/2023 0835   ALKPHOS 50 02/14/2023 0835   BILITOT 0.3 02/14/2023 0835   GFRNONAA >60 10/30/2021 0958   GFRAA >60 03/06/2019 0931     Diabetic Labs (most recent): Lab Results  Component Value Date   HGBA1C 5.7 02/08/2023   HGBA1C 5.6 01/24/2023   HGBA1C 5.6 10/10/2022     Lab Results  Component Value Date   TSH 121.000 (H) 02/14/2023   TSH 127.000 (H) 01/18/2023   TSH 123.000 (H) 11/27/2022   TSH 0.444 (L) 10/03/2022   TSH 0.129 (L) 04/05/2022   TSH 0.55 02/03/2022   TSH 7.410 (H) 09/25/2021   TSH 3.21 05/21/2006   FREET4 0.10 (L) 02/14/2023   FREET4 0.10 (L) 01/18/2023   FREET4 0.10 (L) 11/27/2022  FREET4 1.60 10/03/2022   FREET4 1.48 04/05/2022   FREET4 1.03 09/25/2021     Lipid Panel     Component Value Date/Time   CHOL 274 (H) 02/14/2023 0835   TRIG 148 02/14/2023 0835   HDL 93 02/14/2023 0835   CHOLHDL 2.9 02/14/2023 0835   LDLCALC 155 (H) 02/14/2023 0835   LABVLDL 26 02/14/2023 0835    CT of abdomen and pelvis without contrast on March 06, 2023 IMPRESSION: 1. No findings to explain the patient's clinical history. 2. Trace pericardial fluid. 3. Punctate right renal stone. 4.  Aortic atherosclerosis (ICD10-I70.0).   Assessment & Plan:   1.  Hypothyroidism  2.  Steroid induced adrenal suppression  3.  Prediabetes 4.  Hyperlipidemia   - I have reviewed her recent and available endocrine records and clinically evaluated the patient.  Reviewing her recent labs indicate that  the cause of her hypothyroidism is Hashimoto's thyroiditis.  Her previsit thyroid function tests remain consistent with inadequate replacement.  She could not afford the co-pay for Tirosint.  I discussed and increased her Synthroid to 200 mcg p.o. daily before breakfast.  She is also advised to continue her Cytomel 5 mcg p.o. twice daily-before breakfast and before she goes to bed.  Conservative hypothyroidism seems to be unlikely with negative CT abdomen/pelvis.  She is at risk for imminent severe hypothyroidism/myxedema.   - We discussed about the correct intake of her thyroid hormone, on empty stomach at fasting, with water, separated by at least 30 minutes from breakfast and other medications,  and separated by more than 4 hours from calcium, iron, multivitamins, acid reflux medications (PPIs). -Patient is made aware of the fact that thyroid hormone replacement is needed for life, dose to be adjusted by periodic monitoring of thyroid function tests.    She will need repeat labs in 3 weeks which will include TSH, free T4 and free T3 and before her next visit in 6 weeks.     - she has adrenal suppression due to chronic steroid treatment related to her COPD/asthma.  This patient will likely need lifelong replacement dose steroids, glucocorticoids for now.  She is advised to continue prednisone 5 mg p.o. daily at breakfast.    -Her blood pressure is stable at 104/66 today, if she develops hypotension, she will be considered for Florinef on subsequent visits.    It is unsafe, unnecessary to withdraw prednisone to test her for adrenal insufficiency.  -She wears a medical alert for  depicting her adrenal insufficiency and the need for steroids.   She has point-of-care A1c remains stable at 5.6%, she will not need intervention with medications and is negative  Regarding her dyslipidemia: She presents with improved lipid panel with LDL improving to 82 overall improving from 115.  She will  continue to benefit from statin intervention.  I advised her to continue Pravachol 40 mg p.o. nightly.  Side effects and precautions discussed with her.     - she is advised to maintain close follow up with Benita Stabile, MD for primary care needs.   I spent  26  minutes in the care of the patient today including review of labs from Thyroid Function, CMP, and other relevant labs ; imaging/biopsy records (current and previous including abstractions from other facilities); face-to-face time discussing  her lab results and symptoms, medications doses, her options of short and long term treatment based on the latest standards of care / guidelines;   and documenting the encounter.  Abagayle  Aline Brochure  participated in the discussions, expressed understanding, and voiced agreement with the above plans.  All questions were answered to her satisfaction. she is encouraged to contact clinic should she have any questions or concerns prior to her return visit.   Follow up plan: Return in about 6 weeks (around 04/22/2023), or labs in 3 weeks and before [place].   Marquis Lunch, MD [facility] [address] [place], [place] [address] Phone: [phone]  Fax: [phone]     03/11/2023, 12:55 PM  This note was partially dictated with voice recognition software. Similar sounding words can be transcribed inadequately or may not  be corrected upon review.

## 2023-03-18 ENCOUNTER — Other Ambulatory Visit (HOSPITAL_COMMUNITY): Payer: Self-pay | Admitting: Internal Medicine

## 2023-03-18 DIAGNOSIS — M8580 Other specified disorders of bone density and structure, unspecified site: Secondary | ICD-10-CM

## 2023-03-21 ENCOUNTER — Other Ambulatory Visit: Payer: Self-pay | Admitting: Allergy & Immunology

## 2023-03-26 ENCOUNTER — Other Ambulatory Visit (HOSPITAL_COMMUNITY): Payer: Self-pay | Admitting: Nephrology

## 2023-03-26 DIAGNOSIS — N3281 Overactive bladder: Secondary | ICD-10-CM

## 2023-03-26 DIAGNOSIS — E0590 Thyrotoxicosis, unspecified without thyrotoxic crisis or storm: Secondary | ICD-10-CM

## 2023-03-26 DIAGNOSIS — E273 Drug-induced adrenocortical insufficiency: Secondary | ICD-10-CM

## 2023-03-29 ENCOUNTER — Telehealth: Payer: Self-pay | Admitting: Allergy & Immunology

## 2023-03-29 NOTE — Telephone Encounter (Signed)
[employer] called and needs a prior authorization for medication Fasenra Pen benralizumab (FASENRA PEN) 30 MG/ML prefilled autoinjector  call back number is [phone] Option 5

## 2023-04-01 ENCOUNTER — Ambulatory Visit (HOSPITAL_COMMUNITY)
Admission: RE | Admit: 2023-04-01 | Discharge: 2023-04-01 | Disposition: A | Discharge status: Home / Self Care | Payer: Medicare Other | Source: Ambulatory Visit | Attending: Nephrology | Admitting: Nephrology

## 2023-04-01 ENCOUNTER — Ambulatory Visit (HOSPITAL_COMMUNITY)
Admission: RE | Admit: 2023-04-01 | Discharge: 2023-04-01 | Disposition: A | Discharge status: Home / Self Care | Payer: Medicare Other | Source: Ambulatory Visit | Attending: Internal Medicine | Admitting: Internal Medicine

## 2023-04-01 DIAGNOSIS — E0590 Thyrotoxicosis, unspecified without thyrotoxic crisis or storm: Secondary | ICD-10-CM

## 2023-04-01 DIAGNOSIS — M8588 Other specified disorders of bone density and structure, other site: Secondary | ICD-10-CM | POA: Diagnosis not present

## 2023-04-01 DIAGNOSIS — E273 Drug-induced adrenocortical insufficiency: Secondary | ICD-10-CM

## 2023-04-01 DIAGNOSIS — N3281 Overactive bladder: Secondary | ICD-10-CM | POA: Diagnosis present

## 2023-04-01 DIAGNOSIS — N2889 Other specified disorders of kidney and ureter: Secondary | ICD-10-CM | POA: Insufficient documentation

## 2023-04-01 DIAGNOSIS — M8580 Other specified disorders of bone density and structure, unspecified site: Secondary | ICD-10-CM | POA: Insufficient documentation

## 2023-04-01 NOTE — Telephone Encounter (Signed)
Approval 03/29/23-03/27/24

## 2023-04-02 ENCOUNTER — Other Ambulatory Visit: Payer: Self-pay

## 2023-04-02 LAB — TSH: TSH: 117 u[IU]/mL — ABNORMAL HIGH (ref 0.450–4.500)

## 2023-04-02 LAB — T3, FREE: T3, Free: 0.9 pg/mL — ABNORMAL LOW (ref 2.0–4.4)

## 2023-04-02 LAB — T4, FREE: Free T4: 0.15 ng/dL — ABNORMAL LOW (ref 0.82–1.77)

## 2023-04-09 ENCOUNTER — Other Ambulatory Visit (HOSPITAL_COMMUNITY): Payer: Self-pay

## 2023-04-09 ENCOUNTER — Other Ambulatory Visit (HOSPITAL_COMMUNITY): Payer: Self-pay | Admitting: Pharmacy Technician

## 2023-04-09 NOTE — Progress Notes (Signed)
 Specialty Pharmacy Refill Coordination Note  [name] is a 57 y.o. female contacted today regarding refills of specialty medication(s) Benralizumab  (Fasenra  Pen)   Patient requested Delivery   Delivery date: 04/18/23   Verified address: Patient address [address]  [place] [address]   Medication will be filled on 04/17/23.

## 2023-04-17 ENCOUNTER — Other Ambulatory Visit: Payer: Self-pay

## 2023-04-17 ENCOUNTER — Encounter: Payer: Self-pay | Admitting: Allergy & Immunology

## 2023-04-17 ENCOUNTER — Ambulatory Visit (INDEPENDENT_AMBULATORY_CARE_PROVIDER_SITE_OTHER): Payer: Medicare Other | Admitting: Allergy & Immunology

## 2023-04-17 VITALS — BP 116/64 | HR 84 | Temp 97.9°F | Resp 16 | Ht 64.0 in | Wt 136.2 lb

## 2023-04-17 DIAGNOSIS — Z9981 Dependence on supplemental oxygen: Secondary | ICD-10-CM

## 2023-04-17 DIAGNOSIS — D849 Immunodeficiency, unspecified: Secondary | ICD-10-CM

## 2023-04-17 DIAGNOSIS — J302 Other seasonal allergic rhinitis: Secondary | ICD-10-CM

## 2023-04-17 DIAGNOSIS — J4489 Other specified chronic obstructive pulmonary disease: Secondary | ICD-10-CM | POA: Diagnosis not present

## 2023-04-17 DIAGNOSIS — J3089 Other allergic rhinitis: Secondary | ICD-10-CM

## 2023-04-17 DIAGNOSIS — J4551 Severe persistent asthma with (acute) exacerbation: Secondary | ICD-10-CM

## 2023-04-17 MED ORDER — LEVALBUTEROL TARTRATE 45 MCG/ACT IN AERO: 2.0000 | INHALATION_SPRAY | Freq: Four times a day (QID) | RESPIRATORY_TRACT | 5 refills | Status: DC | PRN

## 2023-04-17 MED ORDER — MONTELUKAST SODIUM 10 MG PO TABS: 10.0000 mg | ORAL_TABLET | Freq: Every day | ORAL | 1 refills | Status: DC

## 2023-04-17 MED ORDER — PREDNISONE 10 MG PO TABS: ORAL_TABLET | ORAL | 0 refills | Status: DC

## 2023-04-17 NOTE — Progress Notes (Signed)
 FOLLOW UP  Date of Service/Encounter:  04/17/23   Assessment:   Steroid and oxygen  dependent asthma-COPD overlap syndrome with acute exacerbation - doing well on Fasenra     Multiple complications of chronic prednisone  use, including avascular necrosis   Stage 3 CKD   AAT1 - MM phenotype with level of 141    Weaning steroids (currently on 7.5 mg and 5 mg every other day alternating)   Seasonal and perennial allergic rhinitis (horse, tobacco leaf, grasses, ragweed, trees, indoor molds, outdoor molds, dust mites, cat and dog)   Osteopenia - bone density in 2022    Balance disorder - in Physical Therapy for this    On disability    Plan/Recommendations:   1. Asthma-COPD overlap syndrome - Lung testing looks a bit lower today and it did improve with the albuterol .  - Start a prednisone  taper that we sent in.  - We are going to change from albuterol  to Xopenex  (a form of albuterol  that does not cause the high heart rate like the albuterol ). - You tolerated the Xopenex  here in clinic without a problem.  - We can consider adding a nebulizer medication that targets a different pathway (phosphodiesterase inhibitor): OhtuvayreT (ensifentrine ) - We can always add that in the future if needed.  - Daily controller medication(s): Breztri  two puffs TWICE DAILY WITH SPACER + prednisone  5mg   daily + Singulair  10mg  daily + Fasenra  every 8 weeks - Rescue medications: Xopenex  4 puffs every 4-6 hours as needed or Xopenex  nebulizer one vial every 4-6 hours as needed - Asthma control goals:  * Full participation in all desired activities (may need albuterol  before activity) * Albuterol  use two time or less a week on average (not counting use with activity) * Cough interfering with sleep two time or less a month * Oral steroids no more than once a year * No hospitalizations   2. Chronic rhinitis (horse, tobacco leaf, grasses, ragweed, trees, indoor molds, outdoor molds, dust mites, cat and  dog) - Continue with: Zyrtec (cetirizine) 10mg  tablet once daily and Singulair  (montelukast ) 10mg  daily and Nasacort  (triamcinolone ) two sprays per nostril daily AS NEEDED - Continue with nasal saline rinses.  3. Return in about 6 months (around 10/15/2023). You can have the follow up appointment with Dr. Iva or a Nurse Practicioner (our Nurse Practitioners are excellent and always have Physician oversight!).   Subjective:   [name] is a 58 y.o. female presenting today for follow up of  Chief Complaint  Patient presents with   Asthma    [name] has a history of the following: Patient Active Problem List   Diagnosis Date Noted   Urinary urgency 11/08/2022   Urge incontinence 11/08/2022   Nocturia 11/08/2022   S/P implantation of urinary electronic stimulator device 11/08/2022   Mixed hyperlipidemia 10/10/2022   Hardening of the aorta (main artery of the heart) (HCC) 09/10/2022   Easy bruising 07/31/2022   Overactive bladder 06/30/2022   Severe persistent asthma without complication 06/06/2022   Gait abnormality 05/28/2022   Steroid-induced adrenal suppression (HCC) 03/30/2022   Minimal cognitive impairment 03/19/2022   Moderate recurrent major depression (HCC) 10/23/2021   Osteopenia 10/23/2021   Acid reflux 10/23/2021   Endocrine disorder, unspecified 07/04/2021   Prediabetes 07/04/2021   Hypothyroidism 07/04/2021   Former smoker 06/29/2021   Seasonal and perennial allergic rhinitis 06/19/2021   Oxygen  dependent 06/19/2021   Steroid dependent (HCC) 06/19/2021   Immunosuppressed status (HCC) 06/19/2021   Major depressive disorder  02/16/2021   Radiculopathy, lumbar region 12/08/2020   Chronic obstructive pulmonary disease (HCC) 10/19/2020   Anxiety 10/13/2020   Status post left hip replacement 07/22/2020   Status post right hip replacement 05/02/2020   Status post total replacement of right hip 04/15/2020   Avascular necrosis of bone of left  hip (HCC) 02/10/2020   Avascular necrosis of bone of right hip (HCC) 02/10/2020   Allergic rhinitis 05/21/2019   Asthma-COPD overlap syndrome (HCC) 04/30/2019   Chronic respiratory failure with hypoxia (HCC) 04/30/2019   Spinal stenosis of cervical region 04/05/2017   Partial tear of right subscapularis tendon    Incisional hernia, without obstruction or gangrene    Urinary frequency 03/21/2012   Migraine equivalent syndrome 03/21/2012   Insomnia 03/21/2012    History obtained from: chart review and patient.  Discussed the use of AI scribe software for clinical note transcription with the patient and/or guardian, who gave verbal consent to proceed.  [name] is a 58 y.o. female presenting for a follow up visit.  She was last seen in August 2024.  At that time, her lung testing looked pretty good.  We continue with Breztri  2 puffs twice daily and prednisone  daily as well as Singulair  and Fasenra  every 8 weeks.  For her rhinitis, we continue with cetirizine as well as montelukast  and Nasacort .  Since last visit, she has done fairly well.  She did have a very stressful April 11, 2023 with a death in her family and that she new diagnosis of stage III kidney disease.  [name] reports that she has been experiencing nocturnal coughing for approximately a week. She reports no significant coughing during the day, but has noticed increased shortness of breath. Despite these symptoms, the patient feels her breathing is under good control. She has been adhering to her prescribed regimen of Breztri  (two puffs twice a day) and Fasenra , which she self-administers at home. The patient has been hesitant to use her rescue inhaler due to associated jitteriness.  She is not interested in adding any other medications to her regimen at this point in time.  We did discuss doing a twice daily nebulizer treatment, but she wants to discontinue what she is doing.  She does have a history of smoking for over thirty years but  quit fifteen years ago. She is currently in stage three COPD. She is on continuous oxygen  therapy, which she feels is necessary for her comfort and symptom control. She reports headaches and chest tightness when not using her oxygen .  She sees Dr. Alva for Pulmonology and she needs to make an appointment.  She tells me today that Dr. Jude is trying to get her off of oxygen  completely.  However, she reports that her oxygen  goes down into this which she is not normally on oxygen , such as when she is showering.  She does use cetirizine daily as well as montelukast  and Nasacort .  This seems to be working to control her allergies.  The patient is also on a daily dose of 5mg  prednisone  due to her long-term prednisone  treatment for breathing. She continues to follow with her endocrinologist Dr. Lenis, and manages all of this.    She has a history of an overactive bladder and actually has a stimulator in place.  She sees Dr. Belvie Clara with urology for this.  Otherwise, there have been no changes to her past medical history, surgical history, family history, or social history.    Review of systems otherwise negative other than that mentioned in  the HPI.    Objective:   Blood pressure 116/64, pulse 84, temperature 97.9 F (36.6 C), resp. rate 16, height 5' 4 (1.626 m), weight 136 lb 3.2 oz (61.8 kg), SpO2 99%. Body mass index is 23.38 kg/m.    Physical Exam Vitals reviewed.  Constitutional:      Appearance: She is well-developed.     Comments: Wearing oxygen .  Smiling.  Cooperative with the exam.  She is able to answer questions and is interactive. She is able to communicate in full sentences.   HENT:     Head: Normocephalic and atraumatic.     Right Ear: Tympanic membrane, ear canal and external ear normal.     Left Ear: Tympanic membrane, ear canal and external ear normal.     Nose: Rhinorrhea present. No nasal deformity, septal deviation or mucosal edema.     Right Turbinates:  Enlarged, swollen and pale.     Left Turbinates: Enlarged, swollen and pale.     Right Sinus: No maxillary sinus tenderness or frontal sinus tenderness.     Left Sinus: No maxillary sinus tenderness or frontal sinus tenderness.     Comments: Nares dry from the oxygen  cannula.  No nasal polyps. Some clear rhinorrhea bilaterally.     Mouth/Throat:     Lips: Pink.     Mouth: Mucous membranes are moist. Mucous membranes are not pale and not dry.     Pharynx: Uvula midline.  Eyes:     General: Lids are normal. Allergic shiner present.        Right eye: No discharge.        Left eye: No discharge.     Conjunctiva/sclera: Conjunctivae normal.     Right eye: Right conjunctiva is not injected. No chemosis.    Left eye: Left conjunctiva is not injected. No chemosis.    Pupils: Pupils are equal, round, and reactive to light.  Cardiovascular:     Rate and Rhythm: Normal rate and regular rhythm.     Heart sounds: Normal heart sounds.  Pulmonary:     Effort: Pulmonary effort is normal. Tachypnea present. No accessory muscle usage or respiratory distress.     Breath sounds: Normal breath sounds. No wheezing, rhonchi or rales.     Comments: Decreased air movement at the bases, especially in the anterior lung fields. Chest:     Chest wall: No tenderness.  Lymphadenopathy:     Cervical: No cervical adenopathy.  Skin:    General: Skin is warm.     Capillary Refill: Capillary refill takes less than 2 seconds.     Coloration: Skin is not pale.     Findings: No abrasion, erythema, petechiae or rash. Rash is not papular, urticarial or vesicular.     Comments: No eczematous lesions noted.   Neurological:     Mental Status: She is alert.  Psychiatric:        Behavior: Behavior is cooperative.      Diagnostic studies:    Spirometry: results abnormal (FEV1: 1.68/67%, FVC: 2.21/69%, FEV1/FVC: 76%).    Spirometry consistent with possible restrictive disease. Xopenex  four puffs via MDI treatment given  in clinic with significant improvement in FEV1 and FVC per ATS criteria.  Allergy  Studies:             Marty Shaggy, MD  [facility] 

## 2023-04-17 NOTE — Progress Notes (Signed)
 Reached out to patient to confirm $12.15 copayment which she authorized to charge to [facility] on file. She requested we ship on 1/9 instead.

## 2023-04-17 NOTE — Patient Instructions (Addendum)
 1. Asthma-COPD overlap syndrome - Lung testing looks a bit lower today and it did improve with the albuterol .  - Start a prednisone  taper that we sent in.  - We are going to change from albuterol  to Xopenex  (a form of albuterol  that does not cause the high heart rate like the albuterol ). - You tolerated the Xopenex  here in clinic without a problem.  - We can consider adding a nebulizer medication that targets a different pathway (phosphodiesterase inhibitor): OhtuvayreT (ensifentrine ) - We can always add that in the future if needed.  - Daily controller medication(s): Breztri  two puffs TWICE DAILY WITH SPACER + prednisone  5mg   daily + Singulair  10mg  daily + Fasenra  every 8 weeks - Rescue medications: Xopenex  4 puffs every 4-6 hours as needed or Xopenex  nebulizer one vial every 4-6 hours as needed - Asthma control goals:  * Full participation in all desired activities (may need albuterol  before activity) * Albuterol  use two time or less a week on average (not counting use with activity) * Cough interfering with sleep two time or less a month * Oral steroids no more than once a year * No hospitalizations   2. Chronic rhinitis (horse, tobacco leaf, grasses, ragweed, trees, indoor molds, outdoor molds, dust mites, cat and dog) - Continue with: Zyrtec (cetirizine) 10mg  tablet once daily and Singulair  (montelukast ) 10mg  daily and Nasacort  (triamcinolone ) two sprays per nostril daily AS NEEDED - Continue with nasal saline rinses.  3. Return in about 6 months (around 10/15/2023). You can have the follow up appointment with Dr. Iva or a Nurse Practicioner (our Nurse Practitioners are excellent and always have Physician oversight!).    Please inform us  of any Emergency Department visits, hospitalizations, or changes in symptoms. Call us  before going to the ED for breathing or allergy  symptoms since we might be able to fit you in for a sick visit. Feel free to contact us  anytime with any questions,  problems, or concerns.  It was a pleasure to see you again today!  Websites that have reliable patient information: 1. American Academy of Asthma, Allergy , and Immunology: www.aaaai.org 2. Food Allergy  Research and Education (FARE): foodallergy.org 3. Mothers of Asthmatics: http://www.asthmacommunitynetwork.org 4. American College of Allergy , Asthma, and Immunology: www.acaai.org      "Like" us  on Facebook and Instagram for our latest updates!      A healthy democracy works best when [employer] participate! Make sure you are registered to vote! If you have moved or changed any of your contact information, you will need to get this updated before voting! Scan the QR codes below to learn more!

## 2023-04-18 ENCOUNTER — Other Ambulatory Visit: Payer: Self-pay

## 2023-04-18 LAB — T4, FREE: Free T4: 0.26 ng/dL — ABNORMAL LOW (ref 0.82–1.77)

## 2023-04-18 LAB — T3, FREE: T3, Free: 1.1 pg/mL — ABNORMAL LOW (ref 2.0–4.4)

## 2023-04-18 LAB — TSH: TSH: 95.7 u[IU]/mL — ABNORMAL HIGH (ref 0.450–4.500)

## 2023-04-22 ENCOUNTER — Ambulatory Visit (INDEPENDENT_AMBULATORY_CARE_PROVIDER_SITE_OTHER): Payer: Medicare Other | Admitting: "Endocrinology

## 2023-04-22 ENCOUNTER — Encounter: Payer: Self-pay | Admitting: "Endocrinology

## 2023-04-22 VITALS — BP 108/64 | HR 80 | Ht 64.0 in | Wt 144.0 lb

## 2023-04-22 DIAGNOSIS — E039 Hypothyroidism, unspecified: Secondary | ICD-10-CM | POA: Diagnosis not present

## 2023-04-22 DIAGNOSIS — E2740 Unspecified adrenocortical insufficiency: Secondary | ICD-10-CM

## 2023-04-22 MED ORDER — LIOTHYRONINE SODIUM 5 MCG PO TABS: 10.0000 ug | ORAL_TABLET | Freq: Two times a day (BID) | ORAL | 1 refills | Status: DC

## 2023-04-22 MED ORDER — LEVOTHYROXINE SODIUM 50 MCG PO TABS: 50.0000 ug | ORAL_TABLET | Freq: Every day | ORAL | 3 refills | Status: DC

## 2023-04-22 NOTE — Progress Notes (Signed)
 04/22/2023, 2:47 PM   Endocrinology follow-up note  Subjective:    Patient ID: [name], female    DOB: 10-May-1965, PCP [name], MD   Past Medical History:  Diagnosis Date   Anemia    Anxiety    Arthritis    Asthma    Cervical radiculopathy    Cluster headaches    COPD (chronic obstructive pulmonary disease) (HCC)    COPD (chronic obstructive pulmonary disease) with chronic bronchitis (HCC)    GERD (gastroesophageal reflux disease)    Hyperlipemia    Hypoglycemia    Hypothyroidism    Migraine    Pneumothorax    PONV (postoperative nausea and vomiting)    Pre-diabetes    Rectal discharge 07/28/2010   Shortness of breath    Sleep apnea    cannot tolerate-not using CPAP   Sluggishness 07/28/2010   Past Surgical History:  Procedure Laterality Date   ABDOMINAL HYSTERECTOMY     with bladder suspension   CERVICAL FUSION     CHOLECYSTECTOMY     COLONOSCOPY N/A 07/30/2013   Procedure: COLONOSCOPY;  Surgeon: Claudis RAYMOND Rivet, MD;  [facility];  Service: Endoscopy;  Laterality: N/A;  930   EXAM UNDER ANESTHESIA WITH MANIPULATION OF SHOULDER Right 10/11/2016   Procedure: EXAM UNDER ANESTHESIA WITH MANIPULATION OF SHOULDER;  Surgeon: Margrette Taft BRAVO, MD;  Location: [facility];  Service: Orthopedics;  Laterality: Right;   HERNIA REPAIR     INCISIONAL HERNIA REPAIR N/A 07/30/2016   Procedure: HERNIA REPAIR INCISIONAL WITH MESH;  Surgeon: Oneil Budge, MD;  Location: [facility];  Service: General;  Laterality: N/A;   INTERSTIM IMPLANT PLACEMENT  02/02/2021   Procedure: RENNA IMPLANT FIRST STAGE LEFT SACRUM ;  Surgeon: Sherrilee Belvie CROME, MD;  Location: [facility];  Service: Urology;;   RENNA IMPLANT PLACEMENT N/A 02/23/2021   Procedure: RENNA IMPLANT SECOND STAGE;  Surgeon: Sherrilee Belvie CROME, MD;  Location: [facility];  Service: Urology;  Laterality: N/A;  Rep coming, time needs to stay at 2:00    INTERSTIM IMPLANT REVISION N/A 10/25/2022   Procedure: REVISION OF RENNA;  Surgeon: Sherrilee Belvie CROME, MD;  Location: [facility];  Service: Urology;  Laterality: N/A;   JOINT REPLACEMENT     LOBECTOMY     right lung    POSTERIOR CERVICAL LAMINECTOMY Right 04/05/2017   Procedure: Right C6-7 Posterior cervical laminectomy;  Surgeon: Onetha Kuba, MD;  Location: [facility] OR;  Service: Neurosurgery;  Laterality: Right;  Right C6-7 Posterior cervical laminectomy   SHOULDER ARTHROSCOPY WITH ROTATOR CUFF REPAIR Right 10/11/2016   Procedure: SHOULDER ARTHROSCOPY;  Surgeon: Margrette Taft BRAVO, MD;  Location: [facility];  Service: Orthopedics;  Laterality: Right;   TOTAL HIP ARTHROPLASTY Right 04/15/2020   Procedure: RIGHT TOTAL HIP ARTHROPLASTY ANTERIOR APPROACH;  Surgeon: Vernetta Lonni GRADE, MD;  Location: [facility];  Service: Orthopedics;  Laterality: Right;   TOTAL HIP ARTHROPLASTY Left 07/22/2020   Procedure: LEFT  HIP ARTHROPLASTY ANTERIOR APPROACH;  Surgeon: Vernetta Lonni GRADE, MD;  Location: [facility];  Service: Orthopedics;  Laterality: Left;  RNFA   TUBAL LIGATION     Social History   Socioeconomic History   Marital status: Married  Spouse name: Not on file   Number of children: Not on file   Years of education: Not on file   Highest education level: Not on file  Occupational History   Not on file  Tobacco Use   Smoking status: Former    Current packs/day: 0.00    Average packs/day: 1 pack/day for 30.0 years (30.0 ttl pk-yrs)    Types: Cigarettes    Start date: 02/20/1978    Quit date: 02/21/2008    Years since quitting: 15.1   Smokeless tobacco: Never  Vaping Use   Vaping status: Never Used  Substance and Sexual Activity   Alcohol use: No    Alcohol/week: 0.0 standard drinks of alcohol    Comment: no   Drug use: No   Sexual activity: Not Currently    Partners: Male    Birth control/protection: Surgical  Other Topics Concern   Not on file  Social History Narrative   Are you  right handed or left handed? Right handed   Are you currently employed ?    What is your current occupation?   Do you live at home alone? NO    Who lives with you? Son   What type of home do you live in: 1 story or 2 story? 1       Social Drivers of Corporate Investment Banker Strain: Not on file  Food Insecurity: Not on file  Transportation Needs: Not on file  Physical Activity: Not on file  Stress: Not on file  Social Connections: Not on file   Family History  Problem Relation Age of Onset   Neuropathy Mother    Migraines Mother    Cancer Mother    COPD Mother    Diabetes Mother    Hyperlipidemia Mother    Hypertension Mother    Hyperlipidemia Father    Hypertension Father    Migraines Sister    Migraines Maternal Aunt    Stroke Maternal Grandmother    Heart failure Maternal Grandmother    Hypertension Maternal Grandmother    Thyroid  disease Maternal Grandmother    Diabetes Paternal Grandmother    Alzheimer's disease Paternal Grandmother    Stroke Paternal Grandfather    Heart failure Paternal Grandfather    Hypertension Paternal Grandfather    Bipolar disorder Son    Colon cancer Neg Hx    Outpatient Encounter Medications as of 04/22/2023  Medication Sig   levothyroxine  (SYNTHROID ) 50 MCG tablet Take 1 tablet (50 mcg total) by mouth daily.   acetaminophen  (TYLENOL ) 500 MG tablet Take 1,000 mg by mouth every 6 (six) hours as needed for moderate pain.   albuterol  (PROVENTIL ) (2.5 MG/3ML) 0.083% nebulizer solution USE 1 VIAL IN NEBULIZER EVERY 6 HOURS AS NEEDED FOR WHEEZING OR SHORTNESS OF BREATH   benralizumab  (FASENRA  PEN) 30 MG/ML prefilled autoinjector Inject 1 mL (30 mg total) into the skin every 8 (eight) weeks.   Budeson-Glycopyrrol-Formoterol  (BREZTRI  AEROSPHERE) 160-9-4.8 MCG/ACT AERO Inhale 2 puffs into the lungs in the morning and at bedtime.   cetirizine (ZYRTEC) 10 MG tablet Take 10 mg by mouth daily.   Cholecalciferol (VITAMIN D3 SUPER STRENGTH PO) Take  5,000 Units by mouth daily.   citalopram  (CELEXA ) 20 MG tablet Take 20 mg by mouth daily.   hydroxypropyl methylcellulose / hypromellose (ISOPTO TEARS / GONIOVISC) 2.5 % ophthalmic solution Place 1 drop into both eyes as needed for dry eyes.   Lancets (ONETOUCH DELICA PLUS LANCET33G) MISC Apply topically.   latanoprost (XALATAN)  0.005 % ophthalmic solution Place 1 drop into both eyes at bedtime.   levalbuterol  (XOPENEX  HFA) 45 MCG/ACT inhaler Inhale 2 puffs into the lungs every 6 (six) hours as needed for wheezing.   levothyroxine  (SYNTHROID ) 200 MCG tablet Take 1 tablet (200 mcg total) by mouth daily before breakfast.   liothyronine  (CYTOMEL ) 5 MCG tablet Take 2 tablets (10 mcg total) by mouth 2 (two) times daily at 8 am and 10 pm.   montelukast  (SINGULAIR ) 10 MG tablet Take 1 tablet (10 mg total) by mouth at bedtime.   Multiple Vitamin (MULTIVITAMIN WITH MINERALS) TABS tablet Take 1 tablet by mouth daily.   pantoprazole  (PROTONIX ) 40 MG tablet Take 30- 60 min before your first and last meals of the day   pravastatin  (PRAVACHOL ) 40 MG tablet Take 40 mg by mouth at bedtime.   predniSONE  (DELTASONE ) 10 MG tablet Take 3 tabs (30mg ) twice daily for 3 days, then 2 tabs (20mg ) twice daily for 3 days, then 1 tab (10mg ) twice daily for 3 days, then STOP.   predniSONE  (DELTASONE ) 5 MG tablet Take 1 tablet (5 mg total) by mouth daily with breakfast.   pregabalin  (LYRICA ) 75 MG capsule Take 75 mg by mouth 2 (two) times daily.   Spacer/Aero-Holding Chambers DEVI 1 each by Does not apply route daily as needed.   SUMAtriptan  6 MG/0.5ML SOAJ Inject 6 mg as directed daily as needed (migraine).    Triamcinolone  Acetonide (NASACORT  ALLERGY  24HR NA) Place 2 sprays into the nose daily as needed (allergies).   valACYclovir  (VALTREX ) 500 MG tablet Take 500-2,000 mg by mouth See admin instructions. Take 2000mg s at first sign of fever blister outbreak then take 500mg s daily until gone   [DISCONTINUED] liothyronine   (CYTOMEL ) 5 MCG tablet Take 1 tablet (5 mcg total) by mouth 2 (two) times daily at 8 am and 10 pm.   No facility-administered encounter medications on file as of 04/22/2023.   ALLERGIES: Allergies  Allergen Reactions   Aspirin  Shortness Of Breath    Causes asthma flares    Penicillins Other (See Comments)    UNSPECIFIED REACTION OF CHILDHOOD  Has patient had a PCN reaction causing immediate rash, facial/tongue/throat swelling, SOB or lightheadedness with hypotension: NO Has patient had a PCN reaction causing severe rash involving mucus membranes or skin necrosis: NO Has patient had a PCN reaction that required hospitalization: no Has patient had a PCN reaction occurring within the last 10 years: NO If all of the above answers are NO, then may proceed with Cephalosporin use.    Vicodin [Hydrocodone -Acetaminophen ] Other (See Comments)    Headaches     VACCINATION STATUS: Immunization History  Administered Date(s) Administered   Influenza,inj,Quad PF,6+ Mos 04/13/2021   Influenza,inj,Quad PF,6-35 Mos 01/28/2020   Influenza,inj,quad, With Preservative 02/07/2022   Influenza-Unspecified 02/15/2017, 04/13/2019, 04/13/2021   PFIZER(Purple Top)SARS-COV-2 Vaccination 06/21/2019, 07/22/2019, 03/09/2020   PNEUMOCOCCAL CONJUGATE-20 04/13/2021    HPI [name] is 58 y.o. female who presents today with a medical history as above. she is being seen in follow-up after she was seen in consultation for steroid induced adrenal suppression.  She also has hypothyroidism due to Hashimoto's thyroiditis.  More recently it has been difficult to regulate her thyroid  function tests despite high dose of levothyroxine /Synthroid .   She is currently on levothyroxine  200 mcg p.o. daily as well as Cytomel  5 mcg p.o. twice daily.  She presents with minimal improvement in her thyroid  function tests. See notes from her previous visit.  She has been  treated with prednisone  more or less continuously for  the last 15 years.  -She is currently on a stable dose of prednisone  10 mg p.o. daily.  She denies any history of adrenal crisis.   She denies diarrhea nor vomiting.  She was offered surveillance CT abdomen/pelvis to rule out consumptive hypothyroidism which returns negative findings. -  She reports fatigue and cold intolerance.  She confirms to me appropriate intake of this medication.  She could not afford the co-pays for Tirosint . She takes her Synthroid  daily on empty stomach at least half an hour before other medications or food.      She has history of heavy smoking, complicated by COPD on ongoing inhalers including albuterol , budesonide , and Perforomist .  She is also on oxygen  supplement 24/7. She denies any history of adrenal injury, penetrating abdominal trauma.  -  She also has hyperlipidemia and prediabetes.  - Her most recent bone density from March 25, 2019 was consistent with osteopenia of the spine, normal bone density on the left forearm radius.    Review of Systems  Constitutional: + Gained 7 pounds since last visit,   + fatigue, no subjective hyperthermia, no subjective hypothermia Eyes: no blurry vision, no xerophthalmia  Respiratory: + On continuous oxygen  supplement.   Status post partial pneumonectomy in 2004.   Objective:       04/22/2023    2:23 PM 04/17/2023   10:04 AM 03/11/2023    9:21 AM  Vitals with BMI  Height 5' 4 5' 4 5' 4  Weight 144 lbs 136 lbs 3 oz 145 lbs 13 oz  BMI 24.71 23.37 25.01  Systolic 108 116 893  Diastolic 64 64 70  Pulse 80 84 76    BP 108/64   Pulse 80   Ht 5' 4 (1.626 m)   Wt 144 lb (65.3 kg)   BMI 24.72 kg/m   Wt Readings from Last 3 Encounters:  04/22/23 144 lb (65.3 kg)  04/17/23 136 lb 3.2 oz (61.8 kg)  03/11/23 145 lb 12.8 oz (66.1 kg)    Physical Exam  Constitutional:  Body mass index is 24.72 kg/m.,  not in acute distress, normal state of mind Eyes: PERRLA, EOMI, no exophthalmos ENT: moist mucous  membranes, no gross thyromegaly, no gross cervical lymphadenopathy   CMP ( most recent) CMP     Component Value Date/Time   NA 141 02/14/2023 0835   K 4.3 02/14/2023 0835   CL 99 02/14/2023 0835   CO2 25 02/14/2023 0835   GLUCOSE 73 02/14/2023 0835   GLUCOSE 104 (H) 10/30/2021 0958   BUN 17 02/14/2023 0835   CREATININE 1.49 (H) 02/14/2023 0835   CALCIUM  9.7 02/14/2023 0835   PROT 6.9 02/14/2023 0835   ALBUMIN 4.8 02/14/2023 0835   AST 56 (H) 02/14/2023 0835   ALT 42 (H) 02/14/2023 0835   ALKPHOS 50 02/14/2023 0835   BILITOT 0.3 02/14/2023 0835   GFRNONAA >60 10/30/2021 0958   GFRAA >60 03/06/2019 0931     Diabetic Labs (most recent): Lab Results  Component Value Date   HGBA1C 5.7 02/08/2023   HGBA1C 5.6 01/24/2023   HGBA1C 5.6 10/10/2022     Lab Results  Component Value Date   TSH 95.700 (H) 04/17/2023   TSH 117.000 (H) 04/01/2023   TSH 121.000 (H) 02/14/2023   TSH 127.000 (H) 01/18/2023   TSH 123.000 (H) 11/27/2022   TSH 0.444 (L) 10/03/2022   TSH 0.129 (L) 04/05/2022   TSH 0.55 02/03/2022  TSH 7.410 (H) 09/25/2021   TSH 3.21 05/21/2006   FREET4 0.26 (L) 04/17/2023   FREET4 0.15 (L) 04/01/2023   FREET4 0.10 (L) 02/14/2023   FREET4 0.10 (L) 01/18/2023   FREET4 0.10 (L) 11/27/2022   FREET4 1.60 10/03/2022   FREET4 1.48 04/05/2022   FREET4 1.03 09/25/2021     Lipid Panel     Component Value Date/Time   CHOL 274 (H) 02/14/2023 0835   TRIG 148 02/14/2023 0835   HDL 93 02/14/2023 0835   CHOLHDL 2.9 02/14/2023 0835   LDLCALC 155 (H) 02/14/2023 0835   LABVLDL 26 02/14/2023 0835    CT of abdomen and pelvis without contrast on March 06, 2023 IMPRESSION: 1. No findings to explain the patient's clinical history. 2. Trace pericardial fluid. 3. Punctate right renal stone. 4.  Aortic atherosclerosis (ICD10-I70.0).   Assessment & Plan:   1.  Hypothyroidism  2.  Steroid induced adrenal suppression  3.  Prediabetes 4.  Hyperlipidemia   - I have  reviewed her recent and available endocrine records and clinically evaluated the patient.  Reviewing her recent labs indicate that the cause of her hypothyroidism is Hashimoto's thyroiditis.  Her previsit thyroid  function tests remain consistent with inadequate replacement.  -Malabsorption is likely reason behind difficulty to regulate her thyroid  function test.   She could not afford the co-pay for Tirosint .  I discussed and increased her Synthroid  to 250 mcg p.o. daily before breakfast.  She is also advised to increase her Cytomel  to 10 mcg p.o. twice daily -before breakfast and before she goes to bed.  -Consumptive hypothyroidism seems to be unlikely with negative CT abdomen/pelvis.  She is at risk for imminent severe hypothyroidism/myxedema.   - We discussed about the correct intake of her thyroid  hormone, on empty stomach at fasting, with water , separated by at least 30 minutes from breakfast and other medications,  and separated by more than 4 hours from calcium , iron, multivitamins, acid reflux medications (PPIs). -Patient is made aware of the fact that thyroid  hormone replacement is needed for life, dose to be adjusted by periodic monitoring of thyroid  function tests.  She will be offered baseline thyroid  ultrasound before her next visit.  She will need repeat labs in 7  weeks which will include TSH, free T4 and free T3 and before her next visit in 8 weeks.     - she has adrenal suppression due to chronic steroid treatment related to her COPD/asthma.  This patient will likely need lifelong replacement dose steroids, glucocorticoids for now.  She is advised to continue prednisone  10 mg p.o. daily at breakfast.    -Her blood pressure is stable at 108/64 today, if she develops hypotension, she will be considered for Florinef on subsequent visits.    It is unsafe, unnecessary to withdraw prednisone  to test her for adrenal insufficiency.  -She wears a medical alert for  depicting her  adrenal insufficiency and the need for steroids.   She has point-of-care A1c remains stable at 5.6%, she will not need intervention with medications and is negative  Regarding her dyslipidemia: She presents with improved lipid panel with LDL improving to 82 overall improving from 115.  She will continue to benefit from statin intervention.  I advised her to continue Pravachol  40 mg p.o. nightly.  Side effects and precautions discussed with her.     - she is advised to maintain close follow up with [name], MD for primary care needs.   I spent  22  minutes in the care of the patient today including review of labs from Thyroid  Function, CMP, and other relevant labs ; imaging/biopsy records (current and previous including abstractions from other facilities); face-to-face time discussing  her lab results and symptoms, medications doses, her options of short and long term treatment based on the latest standards of care / guidelines;   and documenting the encounter.  [name]  participated in the discussions, expressed understanding, and voiced agreement with the above plans.  All questions were answered to her satisfaction. she is encouraged to contact clinic should she have any questions or concerns prior to her return visit.   Follow up plan: Return in about 8 weeks (around 06/17/2023) for F/U with Pre-visit Labs, Thyroid  / Neck Ultrasound.   Ranny Earl, MD [facility] [address] [place], [place] [address] Phone: [phone]  Fax: [phone]     04/22/2023, 2:47 PM  This note was partially dictated with voice recognition software. Similar sounding words can be transcribed inadequately or may not  be corrected upon review.

## 2023-04-23 ENCOUNTER — Telehealth: Payer: Self-pay | Admitting: "Endocrinology

## 2023-04-23 ENCOUNTER — Other Ambulatory Visit: Payer: Self-pay

## 2023-04-23 MED ORDER — PREDNISONE 5 MG PO TABS: 10.0000 mg | ORAL_TABLET | Freq: Every day | ORAL | 1 refills | Status: DC

## 2023-04-23 NOTE — Telephone Encounter (Signed)
 Pt states she is needing prednisone refill sent to [facility] in [place], she states the gave her 20 pills, but I don't see where they sent anything over for a refill.

## 2023-04-23 NOTE — Telephone Encounter (Signed)
 Pt is needing her Prednisone sent to [facility]. Which dosage is she taking?

## 2023-04-23 NOTE — Telephone Encounter (Signed)
 Dr Fransico Him:  For adrenal insufficiency I have her take Prednisone 10 mg daily.    Rx sent

## 2023-04-24 ENCOUNTER — Ambulatory Visit: Payer: Medicare Other | Admitting: "Endocrinology

## 2023-04-25 ENCOUNTER — Ambulatory Visit (HOSPITAL_COMMUNITY)
Admission: RE | Admit: 2023-04-25 | Discharge: 2023-04-25 | Disposition: A | Discharge status: Home / Self Care | Payer: Medicare Other | Source: Ambulatory Visit | Attending: "Endocrinology | Admitting: "Endocrinology

## 2023-04-25 DIAGNOSIS — E039 Hypothyroidism, unspecified: Secondary | ICD-10-CM | POA: Insufficient documentation

## 2023-05-08 ENCOUNTER — Encounter: Payer: Self-pay | Admitting: "Endocrinology

## 2023-05-23 LAB — LAB REPORT - SCANNED
A1c: 5.5
Calcium: 9.3
EGFR: 43
Free T4: 0.1 ng/dL
TSH: 126 — AB (ref 0.41–5.90)

## 2023-05-27 ENCOUNTER — Other Ambulatory Visit: Payer: Self-pay | Admitting: "Endocrinology

## 2023-05-27 MED ORDER — LEVOTHYROXINE SODIUM 200 MCG PO CAPS: 200.0000 ug | ORAL_CAPSULE | Freq: Every day | ORAL | 1 refills | Status: DC

## 2023-05-28 ENCOUNTER — Telehealth: Payer: Self-pay

## 2023-05-28 ENCOUNTER — Other Ambulatory Visit: Payer: Self-pay

## 2023-05-28 MED ORDER — LEVOTHYROXINE SODIUM 200 MCG PO CAPS: 200.0000 ug | ORAL_CAPSULE | Freq: Every day | ORAL | 1 refills | Status: DC

## 2023-05-28 NOTE — Telephone Encounter (Signed)
-----   Message from Marquis Lunch sent at 05/28/2023  2:30 PM EST ----- Yes, continue cytomel. ----- Message ----- From: Derrell Lolling, LPN Sent: 4/74/2595  11:21 AM EST To: Roma Kayser, MD  Pt states [facility] can only fill a 30 day supply at a time of the Tirosint and she will start it as soon as they get it in because they had to order it. Pt asked if she is to continue the Cytomel 10mg  bid also. ----- Message ----- From: Roma Kayser, MD Sent: 05/27/2023   3:43 PM EST To: Derrell Lolling, LPN  Marcio Hoque, I really want to switch her to Tirosint. She is not responding to levothyroxine. Was it a PA issue? ----- Message ----- From: Lennie Odor Sent: 05/27/2023  10:37 AM EST To: Roma Kayser, MD

## 2023-05-28 NOTE — Telephone Encounter (Signed)
Spoke with pt advising her to continue taking Cytomel per Dr.Nida's orders. Pt voiced understanding.

## 2023-05-31 ENCOUNTER — Other Ambulatory Visit: Payer: Self-pay

## 2023-05-31 NOTE — Progress Notes (Signed)
Specialty Pharmacy Refill Coordination Note  [name] is a 58 y.o. female contacted today regarding refills of specialty medication(s) Benralizumab [facility])   Patient requested Delivery   Delivery date: 06/06/23   Verified address: Patient address [address]  [place] [address]   Medication will be filled on 02.26.25.

## 2023-06-03 ENCOUNTER — Encounter (INDEPENDENT_AMBULATORY_CARE_PROVIDER_SITE_OTHER): Payer: Self-pay | Admitting: *Deleted

## 2023-06-04 ENCOUNTER — Telehealth: Payer: Self-pay

## 2023-06-04 NOTE — Telephone Encounter (Signed)
 Left a message requesting pt return call to the office.

## 2023-06-05 ENCOUNTER — Telehealth: Payer: Self-pay

## 2023-06-05 NOTE — Telephone Encounter (Signed)
 Received notification pt's Rx for Tirosint needs prior auth.

## 2023-06-06 ENCOUNTER — Other Ambulatory Visit (HOSPITAL_COMMUNITY): Payer: Self-pay

## 2023-06-06 ENCOUNTER — Telehealth: Payer: Self-pay

## 2023-06-06 NOTE — Telephone Encounter (Signed)
 Pharmacy Patient Advocate Encounter   Received notification from Pt Calls Messages that prior authorization for Tirosint is required/requested.   Insurance verification completed.   The patient is insured through [facility] .   Per test claim: PA required; PA submitted to above mentioned insurance via CoverMyMeds Key/confirmation #/EOC [id number]) Status is pending

## 2023-06-07 ENCOUNTER — Other Ambulatory Visit: Payer: Self-pay

## 2023-06-07 ENCOUNTER — Other Ambulatory Visit (HOSPITAL_COMMUNITY): Payer: Self-pay

## 2023-06-07 NOTE — Telephone Encounter (Signed)
 Pharmacy Patient Advocate Encounter  Received notification from [facility] that Prior Authorization for Tirosint  has been APPROVED through 06/05/24   PA #/Case ID/Reference #: [id number]

## 2023-06-16 NOTE — Progress Notes (Unsigned)
 @Patient  ID: [name], female    DOB: 08-Apr-1966     MRN: [id number]    Referring provider: Benita Stabile, MD  Brief patient profile:  89   yowf  Quit smoking 2009  previously  followed for COPD GOLD II (but really more of an AB /eosinophilic phenotype with pfts nearly nl p saba), dx of chronic hypoxic respiratory failure on oxygen at 4 L and chronic rhinitis Medical history significant for GERD  Tension Pneumothorax 12/2001 s/p VATS w/ mechanical pleurodesis on right -Dr. Maren Beach    TEST/EVENTS :  IgE 05/27/2018   3590 with Eosinophil count 800/mcL > Dellis Anes   07/29/2019 Follow-up : COPD, oxygen dependent respiratory failure, asthma, chronic rhinitis Patient returns for a 32-month follow-up.  Patient has underlying moderate COPD with allergic asthma (eosinophilic phenotype).  Last visit she continued to have increased symptom burden.  She was continued on DuoNeb 4 times daily.  Budesonide twice daily.  Last visit she was started on prednisone 10 mg daily .  She says she does feel that this has helped with her breathing.  She still gets very winded with minimal activity.  Patient had PFTs show stable lung function since 2019.  Patient has moderate airflow obstruction and restriction with FEV1 at 62%, ratio 66, FVC 74%, significant bronchodilator response with positive reversibility, 27% change, significant mid flow reversibility (137% change), DLCO improved at 65%. Patient remains on oxygen at 4 L.  Previous chest x-ray November 2020 showed clear lungs.  He denies any increased cough congestion or wheezing.  Patient is on disability which was approved and March 2021 unfortunately she does not have insurance.  She can afford her current nebulizer regimen. Is her medications through the good Rx rec Continue on Duoneb Four times a day   Continue on Budesonide Neb Twice daily   Continue on Prednisone 10mg  daily  Low sweet and salt diet  Activity as tolerated.  Continue on Oxygen  on 4l/m    11/17/2019  Consultaton as 2nd opinion/[place] office/Joeanna Howdyshell re: AB/ 02 dep resp failure  Chief Complaint  Patient presents with   Consult    Shortness of breath with exertion,   Dyspnea:  Doesn't do grocery shopping x sev years  Last time walked was park [place] 2021 "before got  Hot" on 4lpm and sats 93% she reports while walking  Cough: esp in am / gray  Sleeping: ativan at hs / on side bed is flat/ 2 pillows / 3 x weekly wakes up feeling need for more saba SABA use: "all the time"  02: 4lpm 24/7 though POC at 4 pulsed outside home  Prednisone since jan 2021 but out x sev weeks and much worse even on bud neb rec Make sure you check your oxygen saturations at highest level of activity to be sure it stays over 90% and adjust upward to maintain this level if needed but remember to turn it back to previous settings when you stop (to conserve your supply).  Stop budesonide and duoneb  Plan A = Automatic = Always=    Stiolto 2 puffs first each am  Prednisone 10 mg 2 until better then 1 daily  Singulair 10 mg each pm  Plan B = Backup (to supplement plan A, not to replace it) Only use your albuterol inhaler as a rescue medication  Plan C = Crisis (instead of Plan B but only if Plan B stops working) - only use your albuterol nebulizer if you first try Plan  B and it fails to help > ok to use the nebulizer up to every 4 hours but if start needing it regularly call for immediate appointment   01/13/2020  f/u ov/[place] office/Lawonda Pretlow re: AB / stiolto and pred 10 mg daily  Chief Complaint  Patient presents with   Follow-up    shortness of breath with activity  Dyspnea: limited by R > L hip not sob Cough: minimal > mucoid Sleeping: bed is flat several pillows  SABA use: less ventolin but less active / rarely neb  02: 4lpm 24/7  Likes stiolto but can't afford on her insurance and has also tried Theme park [employer] trelegy and breztri due to either cost or upper airway side effects rec When  you get medicare, strongly advise you to get medicare with commercial supplement  Plan A = Automatic = Always=   General  symbicort 80 Take 2 puffs first thing in am and then another 2 puffs about 12 hours later.  (or stiolto 2 puffs each am but not both)  Work on inhaler technique: Ok to go back to [employer]  like you were before as your maintenance  Plan B = Backup (to supplement plan A, not to replace it) Only use your albuterol inhaler as a rescue medication to be used if you can't catch your breath by resting or doing a relaxed purse lip breathing pattern.  - The less you use it, the better it will work when you need it. - Ok to use the inhaler up to 2 puffs  every 4 hours if you must but call for appointment if use goes up over your usual need - Don't leave home without it !!  (think of it like the spare tire for your car)  Plan C = Crisis (instead of Plan B but only if Plan B stops working) - only use your albuterol nebulizer if you first try Plan B and it fails to help > ok to use the nebulizer up to every 4 hours but if start needing it regularly call for immediate appointment Plan D = Deltasone If doing worse despite ABC then try taking prednisone 2 until better then hold at 10 mg daily for a week then try 5mg        03/07/2020  f/u ov/[place] office/Makyla Bye re: AB stiolto /pred 10 mg one whole, never able to try one half  Chief Complaint  Patient presents with   Follow-up    Patient needs surgical clearance for hip surgery, wears 4 liters oxygen all the time. Wants samples of stiolto   Dyspnea: stops walking walking 50 ft hip > breathing stops her Cough: none Sleeping: bed is flat, neck pillow  SABA use: hfa sev times a week/ neb less but last 03/05/20  02: 4lpm not checking sats   rec Symbicort or stiolto daily but not both Work on inhaler technique: When breathing is bad take prednisone 20 mg daily and 10 mg daily otherwise - take all of it first thing in am   Please remember to go to the lab department @ [facility] for your tests - we will call you with the results when they are available.    06/02/2020  f/u ov/[place] office/Merrillyn Ackerley re: AB stiolto samples plus pred 10 mg daily as can't afford nucala which is what Dr Dellis Anes rec  Chief Complaint  Patient presents with   Follow-up    Breathing is overall doing well. She states that her cough has been "raspy"- prod in  the am with clear to yellow sputum.  She is using her albuterol inhaler 4-5 x per wk and she has not used neb.   Dyspnea:  Just started back walking p Apr 15 2020 THR  R did fine and planning L side next  Cough: usual am cough  Sleeping: flat bed neck pillow does fine  SABA use: as above  02: 3 lpm 24/7  Covid status: vax x 3  Lung cancer screening: n/a Rec No change on medications  I would Dr Ellouise Newer office to ask what insurance works the best for [employer] or whatever they recommend you start on  Please schedule a follow up visit in 3 months but call sooner if needed    12/30/2020  f/u ov/[place] office/Aveion Nguyen re: AB maint on stiolto 2 in am/ fasenra started 12/26/20 and pred at 10 mg daily   Chief Complaint  Patient presents with   Follow-up    3L O2 all of the time including while sleeping. SOB is mainly with exertion. No cough to report.    Dyspnea:  doing PT / walking treadmill x 1.5 mph x 15 min x flat with sats 85 % but not titrating as rec  Cough: minimal in am > clear mucus Sleeping: bed is flat/ 2 pillow on side does fine SABA use: maybe once a day  02: 3lpm cont and 3lpm POC Covid status: vax x 3  Rec Make sure you check your oxygen saturation  at your highest level of activity  to be sure it stays over 90% Prednisone "ceiling" for now is 20 mg until better, then 10 mg daily is the floor but the floor should be able to be reduced as you respond to fosenra     06/29/2021  f/u ov/[place] office/Tava Peery re: AB maint on Prednisone 7.5   No chief  complaint on file. Dyspnea:  PT / treadmill going well with 02 sats 93% on 3lpm  Cough: not a big problem Sleeping: flat 2 pillows  SABA use: maybe twice dialy typically use it after ex or cough  02: 3lpm hs dry  Covid status: vax x 3  Lung cancer screening: advised  Nose bleeds from dry 02  Rec      06/17/2023  f/u ov/[place] office/Kanya Potteiger re: *** maint on ***  No chief complaint on file.   Dyspnea:  *** Cough: *** Sleeping: ***   resp cc  SABA use: *** 02: ***  Lung cancer screening: ***   No obvious day to day or daytime variability or assoc excess/ purulent sputum or mucus plugs or hemoptysis or cp or chest tightness, subjective wheeze or overt sinus or hb symptoms.    Also denies any obvious fluctuation of symptoms with weather or environmental changes or other aggravating or alleviating factors except as outlined above   No unusual exposure hx or h/o childhood pna/ asthma or knowledge of premature birth.  Current Allergies, Complete Past Medical History, Past Surgical History, Family History, and Social History were reviewed in [employer] record.  ROS  The following are not active complaints unless bolded Hoarseness, sore throat, dysphagia, dental problems, itching, sneezing,  nasal congestion or discharge of excess mucus or purulent secretions, ear ache,   fever, chills, sweats, unintended wt loss or wt gain, classically pleuritic or exertional cp,  orthopnea pnd or arm/hand swelling  or leg swelling, presyncope, palpitations, abdominal pain, anorexia, nausea, vomiting, diarrhea  or change in bowel habits or change in bladder habits, change in  stools or change in urine, dysuria, hematuria,  rash, arthralgias, visual complaints, headache, numbness, weakness or ataxia or problems with walking or coordination,  change in mood or  memory.        No outpatient medications have been marked as taking for the 06/17/23 encounter (Appointment) with Nyoka Cowden, MD.                         Past Medical History:  Diagnosis Date   Anxiety    Asthma    Cervical radiculopathy    Cluster headaches    COPD (chronic obstructive pulmonary disease) (HCC)    COPD (chronic obstructive pulmonary disease) with chronic bronchitis (HCC)    GERD (gastroesophageal reflux disease)    Hyperlipemia    Hypoglycemia    Hypothyroidism    Migraine    Pneumothorax    Rectal discharge 07/28/2010   Shortness of breath    Sleep apnea    cannot tolerate-not using CPAP   Sluggishness 07/28/2010        Physical Exam  Wts  06/17/2023        ***  06/29/2021       148  12/30/2020       148  06/02/2020       149  03/07/2020     144  01/13/2020       145   11/17/19 146 lb 12.8 oz (66.6 kg)  09/15/19 149 lb 6.4 oz (67.8 kg)  07/29/19 151 lb (68.5 kg)    Vital signs reviewed  06/17/2023  - Note at rest 02 sats  ***% on ***   General appearance:    ***    Min barr***        Assessment & Plan:

## 2023-06-17 ENCOUNTER — Encounter: Payer: Self-pay | Admitting: Internal Medicine

## 2023-06-17 ENCOUNTER — Ambulatory Visit: Payer: Medicare Other | Admitting: Internal Medicine

## 2023-06-17 VITALS — BP 96/63 | HR 80 | Ht 64.0 in | Wt 147.0 lb

## 2023-06-17 DIAGNOSIS — J9611 Chronic respiratory failure with hypoxia: Secondary | ICD-10-CM | POA: Diagnosis not present

## 2023-06-17 DIAGNOSIS — J4489 Other specified chronic obstructive pulmonary disease: Secondary | ICD-10-CM

## 2023-06-17 LAB — T3, FREE: T3, Free: 1 pg/mL — ABNORMAL LOW (ref 2.0–4.4)

## 2023-06-17 LAB — T4, FREE: Free T4: <0.1 ng/dL — ABNORMAL LOW (ref 0.82–1.77)

## 2023-06-17 LAB — TSH: TSH: 97.7 u[IU]/mL — ABNORMAL HIGH (ref 0.450–4.500)

## 2023-06-17 MED ORDER — FAMOTIDINE 20 MG PO TABS: ORAL_TABLET | ORAL | 11 refills | Status: DC

## 2023-06-17 NOTE — Assessment & Plan Note (Addendum)
 Quit smoking 2009  / steroid dep since Jan 2021  - IgE 05/27/2018   3590 with Eosinophil count 800/mcL > [place]  - PFT's 07/29/2019  FEV1 2.25 (79 % ) ratio 0.70  p 27 % improvement from saba p 0 prior to study with DLCO  14.30 (65%) corrects to 3.87 (91%)  for alv volume and FV curve min curvature   - 11/17/2019    try stiolto x 6 weeks and pred 20  Until better then 10 mg daily plus maint singulair  - Labs ordered 03/07/2020   alpha one AT phenotype  MM - 03/06/20 IgE  1115  - 12/26/20 started Fasenra -12/30/2020  After extensive coaching inhaler device,  effectiveness =    90% with smi > continue stiolto with pred ceiling 20 and floor 10  - 06/29/2021 maint on performist/bud per DR Dellis Anes, pulmonary f/u is prn - 06/17/2023 maint on breztri/ fasenra  - Spirometry 06/17/2023  FEV1 1.68 (67%)  Ratio 0.76   - 06/17/2023  After extensive coaching inhaler device,  effectiveness =    80% so continue breztri and approp saba   Doing better in terms of exac but daily functional status declining ? Related to ex hypoxema see sep a/p  Also: ? Acid (or non-acid) GERD > always difficult to exclude as up to 75% of pts in some series report no assoc GI/ Heartburn symptoms> rec max (24h)  acid suppression and diet restrictions/ reviewed and instructions given in writing > add pepcid 20 mg at hs/ bed blocks/ diet discussed    Each maintenance medication was reviewed in detail including emphasizing most importantly the difference between maintenance and prns and under what circumstances the prns are to be triggered using an action plan format where appropriate.  Total time for H and P, chart review, counseling, reviewing hfa/neb/02 / pulse ox  device(s) , directly observing portions of ambulatory 02 saturation study/ and generating customized AVS unique to this office visit / same day charting = 40 min with pt not seen in 2 years

## 2023-06-17 NOTE — Patient Instructions (Signed)
 Protonix Take 30- 60 min before your first and last meals of the day   Add pepcid 20 mg one hour before bed   GERD (REFLUX)  is an extremely common cause of respiratory symptoms just like yours , many times with no obvious heartburn at all.    It can be treated with medication, but also with lifestyle changes including elevation of the head of your bed (ideally with 6 -8inch blocks under the headboard of your bed),  Smoking cessation, avoidance of late meals, excessive alcohol, and avoid fatty foods, chocolate, peppermint, colas, red wine, and acidic juices such as orange juice.  NO MINT OR MENTHOL PRODUCTS SO NO COUGH DROPS - Luden's ok  USE SUGARLESS CANDY INSTEAD (Jolley ranchers or Stover's or Life Savers) or even ice chips will also do - the key is to swallow to prevent all throat clearing. NO OIL BASED VITAMINS - use powdered substitutes.  Avoid fish oil when coughing.   For cough/congestion  > mucinex dm 1200 mg every hours as needed   Make sure you check your oxygen saturation  AT  your highest level of activity (not after you stop)   to be sure it stays over 90% and adjust  02 flow upward to maintain this level if needed but remember to turn it back to previous settings when you stop (to conserve your supply).   Please schedule a follow up visit in 6  months but call sooner if needed

## 2023-06-17 NOTE — Assessment & Plan Note (Signed)
 11/17/2019   Walked 4lpm her POC  approx   300 ft  @ slow/[place]  stopped due to  Sob and R hip and leg pain with sats still 98%     - 06/17/2023   Walked on 3lpm Pulse   x  1  lap(s) =  approx 150  ft  @ slow pace   stopped due to doe and having hip pain, she was also using a cane for assisitance   with lowest 02 sats 94%    Advised Make sure you check your oxygen saturation  AT  your highest level of activity (not after you stop)   to be sure it stays over 90% and adjust  02 flow upward to maintain this level if needed but remember to turn it back to previous settings when you stop (to conserve your supply).   F/u q 6 m sooner prn

## 2023-06-19 ENCOUNTER — Encounter: Payer: Self-pay | Admitting: "Endocrinology

## 2023-06-19 ENCOUNTER — Ambulatory Visit: Payer: Medicare Other | Admitting: "Endocrinology

## 2023-06-19 ENCOUNTER — Telehealth: Payer: Self-pay

## 2023-06-19 VITALS — BP 86/66 | HR 76 | Ht 64.0 in | Wt 147.8 lb

## 2023-06-19 DIAGNOSIS — E782 Mixed hyperlipidemia: Secondary | ICD-10-CM

## 2023-06-19 DIAGNOSIS — E039 Hypothyroidism, unspecified: Secondary | ICD-10-CM | POA: Diagnosis not present

## 2023-06-19 DIAGNOSIS — T380X5A Adverse effect of glucocorticoids and synthetic analogues, initial encounter: Secondary | ICD-10-CM

## 2023-06-19 DIAGNOSIS — E2740 Unspecified adrenocortical insufficiency: Secondary | ICD-10-CM

## 2023-06-19 DIAGNOSIS — R7303 Prediabetes: Secondary | ICD-10-CM

## 2023-06-19 NOTE — Telephone Encounter (Signed)
 Pt called stating she just received approval from her insurance company for her [employer]ros[employer].

## 2023-06-19 NOTE — Progress Notes (Signed)
 06/19/2023, 2:44 PM   Endocrinology follow-up note  Subjective:    Patient ID: [name], female    DOB: 1966-03-01, PCP Benita Stabile, MD   Past Medical History:  Diagnosis Date   Anemia    Anxiety    Arthritis    Asthma    Cervical radiculopathy    Cluster headaches    COPD (chronic obstructive pulmonary disease) (HCC)    COPD (chronic obstructive pulmonary disease) with chronic bronchitis (HCC)    GERD (gastroesophageal reflux disease)    Hyperlipemia    Hypoglycemia    Hypothyroidism    Migraine    Pneumothorax    PONV (postoperative nausea and vomiting)    Pre-diabetes    Rectal discharge 07/28/2010   Shortness of breath    Sleep apnea    cannot tolerate-not using CPAP   Sluggishness 07/28/2010   Past Surgical History:  Procedure Laterality Date   ABDOMINAL HYSTERECTOMY     with bladder suspension   CERVICAL FUSION     CHOLECYSTECTOMY     COLONOSCOPY N/A 07/30/2013   Procedure: COLONOSCOPY;  Surgeon: Malissa Hippo, MD;  Location: [facility];  Service: Endoscopy;  Laterality: N/A;  930   EXAM UNDER ANESTHESIA WITH MANIPULATION OF SHOULDER Right 10/11/2016   Procedure: EXAM UNDER ANESTHESIA WITH MANIPULATION OF SHOULDER;  Surgeon: Vickki Hearing, MD;  Location: [facility];  Service: Orthopedics;  Laterality: Right;   HERNIA REPAIR     INCISIONAL HERNIA REPAIR N/A 07/30/2016   Procedure: HERNIA REPAIR INCISIONAL WITH MESH;  Surgeon: [name], MD;  Location: [facility];  Service: General;  Laterality: N/A;   INTERSTIM IMPLANT PLACEMENT  02/02/2021   Procedure: Leane Platt IMPLANT FIRST STAGE LEFT SACRUM ;  Surgeon: Malen Gauze, MD;  Location: [facility];  Service: Urology;;   Leane Platt IMPLANT PLACEMENT N/A 02/23/2021   Procedure: Leane Platt IMPLANT SECOND STAGE;  Surgeon: Malen Gauze, MD;  Location: [facility];  Service: Urology;  Laterality: N/A;  Rep coming, time needs to stay at 2:00    INTERSTIM IMPLANT REVISION N/A 10/25/2022   Procedure: REVISION OF Leane Platt;  Surgeon: Malen Gauze, MD;  Location: [facility];  Service: Urology;  Laterality: N/A;   JOINT REPLACEMENT     LOBECTOMY     right lung    POSTERIOR CERVICAL LAMINECTOMY Right 04/05/2017   Procedure: Right C6-7 Posterior cervical laminectomy;  Surgeon: Donalee Citrin, MD;  Location: [facility] OR;  Service: Neurosurgery;  Laterality: Right;  Right C6-7 Posterior cervical laminectomy   SHOULDER ARTHROSCOPY WITH ROTATOR CUFF REPAIR Right 10/11/2016   Procedure: SHOULDER ARTHROSCOPY;  Surgeon: Vickki Hearing, MD;  Location: [facility];  Service: Orthopedics;  Laterality: Right;   TOTAL HIP ARTHROPLASTY Right 04/15/2020   Procedure: RIGHT TOTAL HIP ARTHROPLASTY ANTERIOR APPROACH;  Surgeon: Kathryne Hitch, MD;  Location: [facility];  Service: Orthopedics;  Laterality: Right;   TOTAL HIP ARTHROPLASTY Left 07/22/2020   Procedure: LEFT  HIP ARTHROPLASTY ANTERIOR APPROACH;  Surgeon: Kathryne Hitch, MD;  Location: [facility];  Service: Orthopedics;  Laterality: Left;  RNFA   TUBAL LIGATION     Social History   Socioeconomic History   Marital status: Married  Spouse name: Not on file   Number of children: Not on file   Years of education: Not on file   Highest education level: Not on file  Occupational History   Not on file  Tobacco Use   Smoking status: Former    Current packs/day: 0.00    Average packs/day: 1 pack/day for 30.0 years (30.0 ttl pk-yrs)    Types: Cigarettes    Start date: 02/20/1978    Quit date: 02/21/2008    Years since quitting: 15.3   Smokeless tobacco: Never  Vaping Use   Vaping status: Never Used  Substance and Sexual Activity   Alcohol use: No    Alcohol/week: 0.0 standard drinks of alcohol    Comment: no   Drug use: No   Sexual activity: Not Currently    Partners: Male    Birth control/protection: Surgical  Other Topics Concern   Not on file  Social History Narrative   Are you  right handed or left handed? Right handed   Are you currently employed ?    What is your current occupation?   Do you live at home alone? NO    Who lives with you? Son   What type of home do you live in: 1 story or 2 story? 1       Social Drivers of Corporate investment banker Strain: Not on file  Food Insecurity: Not on file  Transportation Needs: Not on file  Physical Activity: Not on file  Stress: Not on file  Social Connections: Not on file   Family History  Problem Relation Age of Onset   Neuropathy Mother    Migraines Mother    Cancer Mother    COPD Mother    Diabetes Mother    Hyperlipidemia Mother    Hypertension Mother    Hyperlipidemia Father    Hypertension Father    Migraines Sister    Migraines Maternal Aunt    Stroke Maternal Grandmother    Heart failure Maternal Grandmother    Hypertension Maternal Grandmother    Thyroid disease Maternal Grandmother    Diabetes Paternal Grandmother    Alzheimer's disease Paternal Grandmother    Stroke Paternal Grandfather    Heart failure Paternal Grandfather    Hypertension Paternal Grandfather    Bipolar disorder Son    Colon cancer Neg Hx    Outpatient Encounter Medications as of 06/19/2023  Medication Sig   acetaminophen (TYLENOL) 500 MG tablet Take 1,000 mg by mouth every 6 (six) hours as needed for moderate pain.   albuterol (PROVENTIL) (2.5 MG/3ML) 0.083% nebulizer solution USE 1 VIAL IN NEBULIZER EVERY 6 HOURS AS NEEDED FOR WHEEZING OR SHORTNESS OF BREATH   atorvastatin (LIPITOR) 10 MG tablet Take 1 tablet by mouth daily.   benralizumab (FASENRA PEN) 30 MG/ML prefilled autoinjector Inject 1 mL (30 mg total) into the skin every 8 (eight) weeks.   Budeson-Glycopyrrol-Formoterol (BREZTRI AEROSPHERE) 160-9-4.8 MCG/ACT AERO Inhale 2 puffs into the lungs in the morning and at bedtime.   cetirizine (ZYRTEC) 10 MG tablet Take 10 mg by mouth daily.   Cholecalciferol (VITAMIN D3 SUPER STRENGTH PO) Take 5,000 Units by  mouth daily.   citalopram (CELEXA) 20 MG tablet Take 20 mg by mouth daily.   famotidine (PEPCID) 20 MG tablet One an hour before bed   FEROSUL 325 (65 Fe) MG tablet Take 325 mg by mouth daily.   fludrocortisone (FLORINEF) 0.1 MG tablet Take 0.2 mg by mouth daily.   Lancets (  ONETOUCH DELICA PLUS LANCET33G) MISC Apply topically.   latanoprost (XALATAN) 0.005 % ophthalmic solution Place 1 drop into both eyes at bedtime.   levalbuterol (XOPENEX HFA) 45 MCG/ACT inhaler Inhale 2 puffs into the lungs every 6 (six) hours as needed for wheezing.   Levothyroxine Sodium (TIROSINT) 200 MCG CAPS Take 200 mcg by mouth daily before breakfast.   liothyronine (CYTOMEL) 5 MCG tablet Take 2 tablets (10 mcg total) by mouth 2 (two) times daily at 8 am and 10 pm.   montelukast (SINGULAIR) 10 MG tablet Take 1 tablet (10 mg total) by mouth at bedtime.   Multiple Vitamin (MULTIVITAMIN WITH MINERALS) TABS tablet Take 1 tablet by mouth daily.   pantoprazole (PROTONIX) 40 MG tablet Take 30- 60 min before your first and last meals of the day   predniSONE (DELTASONE) 5 MG tablet Take 2 tablets (10 mg total) by mouth daily with breakfast. (Patient taking differently: Take by mouth daily with breakfast.)   pregabalin (LYRICA) 75 MG capsule Take 75 mg by mouth 2 (two) times daily.   Spacer/Aero-Holding Chambers DEVI 1 each by Does not apply route daily as needed.   SUMAtriptan 6 MG/0.5ML SOAJ Inject 6 mg as directed daily as needed (migraine).    Triamcinolone Acetonide (NASACORT ALLERGY 24HR NA) Place 2 sprays into the nose daily as needed (allergies).   valACYclovir (VALTREX) 500 MG tablet Take 500-2,000 mg by mouth See admin instructions. Take 2000mg s at first sign of fever blister outbreak then take 500mg s daily until gone   [DISCONTINUED] pravastatin (PRAVACHOL) 40 MG tablet Take 40 mg by mouth at bedtime.   No facility-administered encounter medications on file as of 06/19/2023.   ALLERGIES: Allergies  Allergen  Reactions   Aspirin Shortness Of Breath    Causes asthma flares    Penicillins Other (See Comments)    UNSPECIFIED REACTION OF CHILDHOOD  Has patient had a PCN reaction causing immediate rash, facial/tongue/throat swelling, SOB or lightheadedness with hypotension: NO Has patient had a PCN reaction causing severe rash involving mucus membranes or skin necrosis: NO Has patient had a PCN reaction that required hospitalization: no Has patient had a PCN reaction occurring within the last 10 years: NO If all of the above answers are "NO", then may proceed with Cephalosporin use.    Vicodin [Hydrocodone-Acetaminophen] Other (See Comments)    Headaches     VACCINATION STATUS: Immunization History  Administered Date(s) Administered   Influenza,inj,Quad PF,6+ Mos 04/13/2021   Influenza,inj,Quad PF,6-35 Mos 01/28/2020   Influenza,inj,quad, With Preservative 02/07/2022   Influenza-Unspecified 02/15/2017, 04/13/2019, 04/13/2021   PFIZER(Purple Top)SARS-COV-2 Vaccination 06/21/2019, 07/22/2019, 03/09/2020   PNEUMOCOCCAL CONJUGATE-20 04/13/2021    HPI Madline Lacretia Nicks Pharris is 58 y.o. female who presents today with a medical history as above. she is being seen in follow-up after she was seen in consultation for steroid induced adrenal suppression.  She also has hypothyroidism due to Hashimoto's thyroiditis.  More recently it has been difficult to regulate her thyroid function tests despite high dose of levothyroxine/Synthroid.   Recently she was given a prescription for Tirosint 200 mcg p.o. daily along with Cytomel 10 mcg twice daily. She has no new complaints today.  Her labs are still consistent with significant under replacement.  Labs were drawn on the ambulance after she was switched to steal syndrome. For her adrenal insufficiency, she was on ongoing prednisone 10 mg p.o. daily and most recently fludrocortisone 0.1 mg was added for her. Her intake of blood pressure is still low at 82  6 over  66 mmHg.  See notes from her previous visit.  She has been treated with prednisone more or less continuously for the last 15 years.   She denies any history of adrenal crisis.   She denies diarrhea nor vomiting.  She was offered surveillance CT abdomen/pelvis to rule out consumptive hypothyroidism which returns negative findings. -  She reports fatigue and cold intolerance.  She confirms to me appropriate intake of her thyroid medications consistently.  Her Tirosint will require prior authorization.       She has history of heavy smoking, complicated by COPD on ongoing inhalers including albuterol, budesonide, and Perforomist.  She is also on oxygen supplement 24/7. She denies any history of adrenal injury, penetrating abdominal trauma.  -  She also has hyperlipidemia and prediabetes.  - Her most recent bone density from March 25, 2019 was consistent with osteopenia of the spine, normal bone density on the left forearm radius.    Review of Systems  Constitutional: + Steady weight,    + fatigue, no subjective hyperthermia, no subjective hypothermia Eyes: no blurry vision, no xerophthalmia  Respiratory: + On continuous oxygen supplement.   Status post partial pneumonectomy in 2004.   Objective:       06/19/2023   10:30 AM 06/17/2023   10:48 AM 04/22/2023    2:23 PM  Vitals with BMI  Height 5\' 4"  5\' 4"  5\' 4"   Weight 147 lbs 13 oz 147 lbs 144 lbs  BMI 25.36 25.22 24.71  Systolic 86 96 108  Diastolic 66 63 64  Pulse 76 80 80    BP (!) 86/66   Pulse 76   Ht 5\' 4"  (1.626 m)   Wt 147 lb 12.8 oz (67 kg)   BMI 25.37 kg/m   Wt Readings from Last 3 Encounters:  06/19/23 147 lb 12.8 oz (67 kg)  06/17/23 147 lb (66.7 kg)  04/22/23 144 lb (65.3 kg)    Physical Exam  Constitutional:  Body mass index is 25.37 kg/m.,  not in acute distress, normal state of mind    CMP ( most recent) CMP     Component Value Date/Time   NA 141 02/14/2023 0835   K 4.3 02/14/2023 0835   CL  99 02/14/2023 0835   CO2 25 02/14/2023 0835   GLUCOSE 73 02/14/2023 0835   GLUCOSE 104 (H) 10/30/2021 0958   BUN 17 02/14/2023 0835   CREATININE 1.49 (H) 02/14/2023 0835   CALCIUM 9.3 05/16/2023 0000   PROT 6.9 02/14/2023 0835   ALBUMIN 4.8 02/14/2023 0835   AST 56 (H) 02/14/2023 0835   ALT 42 (H) 02/14/2023 0835   ALKPHOS 50 02/14/2023 0835   BILITOT 0.3 02/14/2023 0835   GFRNONAA >60 10/30/2021 0958   GFRAA >60 03/06/2019 0931     Diabetic Labs (most recent): Lab Results  Component Value Date   HGBA1C 5.7 02/08/2023   HGBA1C 5.6 01/24/2023   HGBA1C 5.6 10/10/2022     Lab Results  Component Value Date   TSH 97.700 (H) 06/13/2023   TSH 126.00 (A) 05/16/2023   TSH 95.700 (H) 04/17/2023   TSH 117.000 (H) 04/01/2023   TSH 121.000 (H) 02/14/2023   TSH 127.000 (H) 01/18/2023   TSH 123.000 (H) 11/27/2022   TSH 0.444 (L) 10/03/2022   TSH 0.129 (L) 04/05/2022   TSH 0.55 02/03/2022   FREET4 <0.10 (L) 06/13/2023   FREET4 0.10 05/16/2023   FREET4 0.26 (L) 04/17/2023   FREET4 0.15 (L) 04/01/2023  FREET4 0.10 (L) 02/14/2023   FREET4 0.10 (L) 01/18/2023   FREET4 0.10 (L) 11/27/2022   FREET4 1.60 10/03/2022   FREET4 1.48 04/05/2022   FREET4 1.03 09/25/2021     Lipid Panel     Component Value Date/Time   CHOL 274 (H) 02/14/2023 0835   TRIG 148 02/14/2023 0835   HDL 93 02/14/2023 0835   CHOLHDL 2.9 02/14/2023 0835   LDLCALC 155 (H) 02/14/2023 0835   LABVLDL 26 02/14/2023 0835    CT of abdomen and pelvis without contrast on March 06, 2023 IMPRESSION: 1. No findings to explain the patient's clinical history. 2. Trace pericardial fluid. 3. Punctate right renal stone. 4.  Aortic atherosclerosis (ICD10-I70.0).   Thyroid ultrasound on April 25, 2023 No discrete nodules are seen within the thyroid gland.   IMPRESSION: Markedly heterogeneous and small/atrophied thyroid gland consistent with the clinical history of hypothyroidism and presumed long-standing  exogenous thyroid hormone replacement therapy.  No thyroid nodules.  Assessment & Plan:   1.  Hypothyroidism -due to Hashimoto's thyroiditis 2.  Steroid induced adrenal suppression  3.  Prediabetes 4.  Hyperlipidemia   - I have reviewed her recent and available endocrine records and clinically evaluated the patient.  Reviewing her recent labs indicate that the cause of her hypothyroidism is Hashimoto's thyroiditis.  Her previsit thyroid function tests remain consistent with significantly  inadequate replacement.  -Malabsorption is likely reason behind difficulty to regulate her thyroid function test. -She was given 1 month supply of Tirosint, will need prior evaluation for subsequent refills.  She is advised to continue Trulicity and 200 mcg p.o. daily before breakfast and Cytomel 10 mcg p.o. twice daily-before breakfast and before going to bed.   -Consumptive hypothyroidism seems to be unlikely with negative CT abdomen/pelvis.  She is at risk for imminent severe hypothyroidism/myxedema.   - We discussed about the correct intake of her thyroid hormone, on empty stomach at fasting, with water, separated by at least 30 minutes from breakfast and other medications,  and separated by more than 4 hours from calcium, iron, multivitamins, acid reflux medications (PPIs). -Patient is made aware of the fact that thyroid hormone replacement is needed for life, dose to be adjusted by periodic monitoring of thyroid function tests.  -Her surveillance thyroid/neck ultrasound was unremarkable.   - she has adrenal suppression due to chronic steroid treatment related to her COPD/asthma.  This patient will likely need lifelong replacement dose steroids, glucocorticoids for now.  She is advised to continue prednisone 10 mg p.o. daily at breakfast.    -Her blood pressure is low at 86/66, asymptomatic.  I advised her to increase her Florinef to 0.2 mg p.o. daily at breakfast.   It is unsafe, unnecessary  to withdraw prednisone to test her for adrenal insufficiency.  -She wears a medical alert for  depicting her adrenal insufficiency and the need for steroids.   She has point-of-care A1c remains stable at 5.6% indicating absence of prediabetes/diabetes.    Regarding her dyslipidemia: She presents with worsening lipid panel with LDL at 155.  She is advised to continue atorvastatin 10 mg p.o. nightly.  Side effects and precautions discussed with her.     - she is advised to maintain close follow up with Benita Stabile, MD for primary care needs.   I spent  26  minutes in the care of the patient today including review of labs from Thyroid Function, CMP, and other relevant labs ; imaging/biopsy records (current and previous including abstractions  from other facilities); face-to-face time discussing  her lab results and symptoms, medications doses, her options of short and long term treatment based on the latest standards of care / guidelines;   and documenting the encounter.  Loreta Ave  participated in the discussions, expressed understanding, and voiced agreement with the above plans.  All questions were answered to her satisfaction. she is encouraged to contact clinic should she have any questions or concerns prior to her return visit.  Follow up plan: Return in about 9 weeks (around 08/21/2023) for Fasting Labs  in AM B4 8.   Marquis Lunch, MD [facility] [address] [place], [place] [address] Phone: [phone]  Fax: [phone]     06/19/2023, 2:44 PM  This note was partially dictated with voice recognition software. Similar sounding words can be transcribed inadequately or may not  be corrected upon review.

## 2023-06-19 NOTE — Telephone Encounter (Signed)
 Received notice pt's Tirosent needs a prior authorization.

## 2023-06-28 ENCOUNTER — Encounter (INDEPENDENT_AMBULATORY_CARE_PROVIDER_SITE_OTHER): Payer: Self-pay | Admitting: *Deleted

## 2023-07-01 ENCOUNTER — Encounter (INDEPENDENT_AMBULATORY_CARE_PROVIDER_SITE_OTHER): Payer: Self-pay | Admitting: Gastroenterology

## 2023-07-01 ENCOUNTER — Ambulatory Visit (INDEPENDENT_AMBULATORY_CARE_PROVIDER_SITE_OTHER): Payer: Medicare Other | Admitting: Gastroenterology

## 2023-07-01 VITALS — BP 103/69 | HR 75 | Temp 97.5°F | Ht 64.0 in | Wt 150.6 lb

## 2023-07-01 DIAGNOSIS — D649 Anemia, unspecified: Secondary | ICD-10-CM | POA: Insufficient documentation

## 2023-07-01 DIAGNOSIS — R079 Chest pain, unspecified: Secondary | ICD-10-CM | POA: Insufficient documentation

## 2023-07-01 DIAGNOSIS — R0789 Other chest pain: Secondary | ICD-10-CM

## 2023-07-01 NOTE — Patient Instructions (Addendum)
 Continue oral iron Continue pantoprazole 40 mg twice a day and famotidine at night Follow up with Dr. Fransico Him closely, will need to obtain clearance from him to proceed with EGD and colonoscopy for evaluation of anemia Cardiology referral

## 2023-07-01 NOTE — Progress Notes (Signed)
 Katrinka Blazing, M.D. Gastroenterology & Hepatology [facility] [address] [place], [place] [address] Primary Care Physician: Benita Stabile, MD [address] [place] [address]  Referring MD: PCP  Chief Complaint: Anemia  History of Present Illness: [name] is a 58 y.o. female with past medical history of COPD on 3L/min, Hashimoto's thyroiditis, adrenal insufficiency, GERD, hyperlipidemia, sleep apnea, prediabetes, who presents for evaluation of anemia.  Review of most recent blood workup from 05/16/2023 showed a hemoglobin of 10.9, WBC 8.2, platelets 282, CMP with creatinine 1.41, BUN 13, sodium 145, potassium 3.9, albumin 4.6, total bilirubin is less than 0.2, AST 23, ALT 17, alkaline phosphatase 54.  Patient had iron stores checked on 03/14/2023 which showed normal TIBC 394, borderline iron saturation of 15%, iron of 59, negative hepatitis B surface antigen.  These labs were checked by the patient's nephrologist, Dr. Wolfgang Phoenix.  Patient reports having some intermittent issues with upper abdominal pain. She has had intermittent upper abdominal pain for the last 3 years. Pain may be present a few times a day. Pain may improve after having a bowel movement. Sometimes has a bowel movement after a week. She may take a stool softener every few days - Colace OTC when this happens, which may eventually lead to diarrhea after a few days.  She also states having discomfort and "oil sensation in the throat". Also feels a chest discomfort similar to a pressure in the middle of her chest. She reports having issues with burping and feeling nauseated after this, without vomiting.  Patient has been on iron for a few months. This was started after she had her last labs drawn in late 2024.  Sometimes may have some streaks of blood on tissue when wiping when she is very constipated, but this does not happen often. Stool is now dark as she is taking  iron.  Patient reports that despite taking pantoprazole 40 mg BID 30 minutes before meals, she is still having chest discomfort. Was advised to take famotidien at night by pulmonologist.  The patient denies having any fever, chills,  hematemesis, abdominal distention, jaundice, pruritus. Has been gaining weight despite not eating too much.  Most recent abdominal imaging was a CT of the abdomen and pelvis without contrast on 03/06/2023 which showed trace pericardial fluid and no other acute abnormalities.  There was presence of aortic atherosclerosis.  Notably, the patient has presented significant issues with hypothyroidism.  Last TSH on 06/13/2023 was 97.7, free T4 was less than 0.1 and free T3 was 1.0.  Currently being followed by Dr. Fransico Him -endocrinology, last appointment was on 06/19/2023.  May take naproxen as needed, may have taken it 4-5 times in last 3 months. Used to take Excedrin and BC powders very frequently in the past but not recently  Last EGD:>20 years ago, no report available  Last Colonoscopy:07/30/2013 Prep satisfactory. Normal mucosa of cecum, ascending colon, hepatic flexure, transverse colon, splenic flexure, descending and sigmoid colon. Normal rectal mucosa. hemorrhoids below the dentate line.  FHx: neg for any gastrointestinal/liver disease, mother lung cancer Social: quit smoking almost 16 years ago, neg alcohol or illicit drug use Surgical: abdominal hernia  Past Medical History: Past Medical History:  Diagnosis Date   Anemia    Anxiety    Arthritis    Asthma    Cervical radiculopathy    Cluster headaches    COPD (chronic obstructive pulmonary disease) (HCC)    COPD (chronic obstructive pulmonary disease)  with chronic bronchitis (HCC)    GERD (gastroesophageal reflux disease)    Hyperlipemia    Hypoglycemia    Hypothyroidism    Migraine    Pneumothorax    PONV (postoperative nausea and vomiting)    Pre-diabetes    Rectal discharge 07/28/2010    Shortness of breath    Sleep apnea    cannot tolerate-not using CPAP   Sluggishness 07/28/2010    Past Surgical History: Past Surgical History:  Procedure Laterality Date   ABDOMINAL HYSTERECTOMY     with bladder suspension   CERVICAL FUSION     CHOLECYSTECTOMY     COLONOSCOPY N/A 07/30/2013   Procedure: COLONOSCOPY;  Surgeon: Malissa Hippo, MD;  [facility]: [facility];  Service: Endoscopy;  Laterality: N/A;  930   EXAM UNDER ANESTHESIA WITH MANIPULATION OF SHOULDER Right 10/11/2016   Procedure: EXAM UNDER ANESTHESIA WITH MANIPULATION OF SHOULDER;  Surgeon: Vickki Hearing, MD;  Location: [facility];  Service: Orthopedics;  Laterality: Right;   HERNIA REPAIR     INCISIONAL HERNIA REPAIR N/A 07/30/2016   Procedure: HERNIA REPAIR INCISIONAL WITH MESH;  Surgeon: [name], MD;  Location: [facility];  Service: General;  Laterality: N/A;   INTERSTIM IMPLANT PLACEMENT  02/02/2021   Procedure: Leane Platt IMPLANT FIRST STAGE LEFT SACRUM ;  Surgeon: Malen Gauze, MD;  Location: [facility];  Service: Urology;;   Leane Platt IMPLANT PLACEMENT N/A 02/23/2021   Procedure: Leane Platt IMPLANT SECOND STAGE;  Surgeon: Malen Gauze, MD;  Location: [facility];  Service: Urology;  Laterality: N/A;  Rep coming, time needs to stay at 2:00   INTERSTIM IMPLANT REVISION N/A 10/25/2022   Procedure: REVISION OF Leane Platt;  Surgeon: Malen Gauze, MD;  Location: [facility];  Service: Urology;  Laterality: N/A;   JOINT REPLACEMENT     LOBECTOMY     right lung    POSTERIOR CERVICAL LAMINECTOMY Right 04/05/2017   Procedure: Right C6-7 Posterior cervical laminectomy;  Surgeon: Donalee Citrin, MD;  Location: [facility] OR;  Service: Neurosurgery;  Laterality: Right;  Right C6-7 Posterior cervical laminectomy   SHOULDER ARTHROSCOPY WITH ROTATOR CUFF REPAIR Right 10/11/2016   Procedure: SHOULDER ARTHROSCOPY;  Surgeon: Vickki Hearing, MD;  Location: [facility];  Service: Orthopedics;  Laterality: Right;   TOTAL HIP  ARTHROPLASTY Right 04/15/2020   Procedure: RIGHT TOTAL HIP ARTHROPLASTY ANTERIOR APPROACH;  Surgeon: Kathryne Hitch, MD;  Location: [facility];  Service: Orthopedics;  Laterality: Right;   TOTAL HIP ARTHROPLASTY Left 07/22/2020   Procedure: LEFT  HIP ARTHROPLASTY ANTERIOR APPROACH;  Surgeon: Kathryne Hitch, MD;  Location: [facility];  Service: Orthopedics;  Laterality: Left;  RNFA   TUBAL LIGATION      Family History: Family History  Problem Relation Age of Onset   Neuropathy Mother    Migraines Mother    Cancer Mother    COPD Mother    Diabetes Mother    Hyperlipidemia Mother    Hypertension Mother    Hyperlipidemia Father    Hypertension Father    Migraines Sister    Migraines Maternal Aunt    Stroke Maternal Grandmother    Heart failure Maternal Grandmother    Hypertension Maternal Grandmother    Thyroid disease Maternal Grandmother    Diabetes Paternal Grandmother    Alzheimer's disease Paternal Grandmother    Stroke Paternal Grandfather    Heart failure Paternal Grandfather    Hypertension Paternal Grandfather    Bipolar disorder Son    Colon cancer Neg Hx  Social History: Social History   Tobacco Use  Smoking Status Former   Current packs/day: 0.00   Average packs/day: 1 pack/day for 30.0 years (30.0 ttl pk-yrs)   Types: Cigarettes   Start date: 02/20/1978   Quit date: 02/21/2008   Years since quitting: 15.3  Smokeless Tobacco Never   Social History   Substance and Sexual Activity  Alcohol Use No   Alcohol/week: 0.0 standard drinks of alcohol   Comment: no   Social History   Substance and Sexual Activity  Drug Use No    Allergies: Allergies  Allergen Reactions   Aspirin Shortness Of Breath    Causes asthma flares    Penicillins Other (See Comments)    UNSPECIFIED REACTION OF CHILDHOOD  Has patient had a PCN reaction causing immediate rash, facial/tongue/throat swelling, SOB or lightheadedness with hypotension: NO Has patient had  a PCN reaction causing severe rash involving mucus membranes or skin necrosis: NO Has patient had a PCN reaction that required hospitalization: no Has patient had a PCN reaction occurring within the last 10 years: NO If all of the above answers are "NO", then may proceed with Cephalosporin use.    Vicodin [Hydrocodone-Acetaminophen] Other (See Comments)    Headaches     Medications: Current Outpatient Medications  Medication Sig Dispense Refill   acetaminophen (TYLENOL) 500 MG tablet Take 1,000 mg by mouth every 6 (six) hours as needed for moderate pain.     albuterol (PROVENTIL) (2.5 MG/3ML) 0.083% nebulizer solution USE 1 VIAL IN NEBULIZER EVERY 6 HOURS AS NEEDED FOR WHEEZING OR SHORTNESS OF BREATH 75 mL 0   atorvastatin (LIPITOR) 10 MG tablet Take 1 tablet by mouth daily.     benralizumab (FASENRA PEN) 30 MG/ML prefilled autoinjector Inject 1 mL (30 mg total) into the skin every 8 (eight) weeks. 1 mL 6   Budeson-Glycopyrrol-Formoterol (BREZTRI AEROSPHERE) 160-9-4.8 MCG/ACT AERO Inhale 2 puffs into the lungs in the morning and at bedtime. 3 each 4   cetirizine (ZYRTEC) 10 MG tablet Take 10 mg by mouth daily.     Cholecalciferol (VITAMIN D3 SUPER STRENGTH PO) Take 5,000 Units by mouth daily.     citalopram (CELEXA) 20 MG tablet Take 20 mg by mouth daily.     famotidine (PEPCID) 20 MG tablet One an hour before bed 30 tablet 11   FEROSUL 325 (65 Fe) MG tablet Take 325 mg by mouth daily.     fludrocortisone (FLORINEF) 0.1 MG tablet Take 0.2 mg by mouth daily.     Lancets (ONETOUCH DELICA PLUS LANCET33G) MISC Apply topically.     latanoprost (XALATAN) 0.005 % ophthalmic solution Place 1 drop into both eyes at bedtime.     levalbuterol (XOPENEX HFA) 45 MCG/ACT inhaler Inhale 2 puffs into the lungs every 6 (six) hours as needed for wheezing. 1 each 5   levothyroxine (SYNTHROID) 50 MCG tablet Take 50 mcg by mouth daily at 6 (six) AM. Takes with a 200 mg daily     Levothyroxine Sodium (TIROSINT)  200 MCG CAPS Take 200 mcg by mouth daily before breakfast. 90 capsule 1   liothyronine (CYTOMEL) 5 MCG tablet Take 2 tablets (10 mcg total) by mouth 2 (two) times daily at 8 am and 10 pm. 120 tablet 1   montelukast (SINGULAIR) 10 MG tablet Take 1 tablet (10 mg total) by mouth at bedtime. 90 tablet 1   Multiple Vitamin (MULTIVITAMIN WITH MINERALS) TABS tablet Take 1 tablet by mouth daily.     pantoprazole (PROTONIX) 40  MG tablet Take 30- 60 min before your first and last meals of the day     predniSONE (DELTASONE) 5 MG tablet Take 2 tablets (10 mg total) by mouth daily with breakfast. (Patient taking differently: Take by mouth daily with breakfast.) 180 tablet 1   pregabalin (LYRICA) 75 MG capsule Take 75 mg by mouth 2 (two) times daily.     Spacer/Aero-Holding Chambers DEVI 1 each by Does not apply route daily as needed. 1 each 2   SUMAtriptan 6 MG/0.5ML SOAJ Inject 6 mg as directed daily as needed (migraine).      Triamcinolone Acetonide (NASACORT ALLERGY 24HR NA) Place 2 sprays into the nose daily as needed (allergies).     valACYclovir (VALTREX) 500 MG tablet Take 500-2,000 mg by mouth See admin instructions. Take 2000mg s at first sign of fever blister outbreak then take 500mg s daily until gone     No current facility-administered medications for this visit.    Review of Systems: GENERAL: negative for malaise, night sweats HEENT: No changes in hearing or vision, no nose bleeds or other nasal problems. NECK: Negative for lumps, goiter, pain and significant neck swelling RESPIRATORY: Negative for cough, wheezing CARDIOVASCULAR: Negative for chest pain, leg swelling, palpitations, orthopnea GI: SEE HPI MUSCULOSKELETAL: Negative for joint pain or swelling, back pain, and muscle pain. SKIN: Negative for lesions, rash PSYCH: Negative for sleep disturbance, mood disorder and recent psychosocial stressors. HEMATOLOGY Negative for prolonged bleeding, bruising easily, and swollen nodes. ENDOCRINE:  Negative for cold or heat intolerance, polyuria, polydipsia and goiter. NEURO: negative for tremor, gait imbalance, syncope and seizures. The remainder of the review of systems is noncontributory.   Physical Exam: BP 103/69 (BP Location: Left Arm, Patient Position: Sitting, Cuff Size: Normal)   Pulse 75   Temp (!) 97.5 F (36.4 C) (Temporal)   Ht 5\' 4"  (1.626 m)   Wt 150 lb 9.6 oz (68.3 kg)   BMI 25.85 kg/m  GENERAL: The patient is AO x3, in no acute distress. HEENT: Head is normocephalic and atraumatic. EOMI are intact. Mouth is well hydrated and without lesions. NECK: Supple. No masses LUNGS: Clear to auscultation. No presence of rhonchi/wheezing/rales. Adequate chest expansion HEART: RRR, normal s1 and s2. ABDOMEN: Soft, nontender, no guarding, no peritoneal signs, and nondistended. BS +. No masses. EXTREMITIES: Without any cyanosis, clubbing, rash, lesions or edema. NEUROLOGIC: AOx3, no focal motor deficit. SKIN: no jaundice, no rashes   Imaging/Labs: as above  I personally reviewed and interpreted the available labs, imaging and endoscopic files.  Impression and Plan: [name] is a 58 y.o. female with past medical history of COPD on 3L/min, Hashimoto's thyroiditis, adrenal insufficiency, GERD, hyperlipidemia, sleep apnea, prediabetes, who presents for evaluation of anemia.  Patient was found to have some borderline saturation in her most recent iron stores testing.  She also has presence of mild symptomatic anemia.  Ideally, this should be evaluated with a colonoscopy and an EGD to evaluate some chronic losses from the gastrointestinal tract.  However, the patient has had a very difficult to treat hypothyroidism and adrenal insufficiency, which is currently being followed by endocrinology.  I discussed thoroughly with the patient the fact that this will need to be better controlled before we proceed with endoscopic procedures under propofol as sedation may favor  hypotension and bradycardia.  She is at higher risk risk of presenting intraprocedural complications given her severe hypothyroidism and adrenal insufficiency.  I will reach Dr. Needed to discuss the need to optimize her  metabolic regimen and the need to obtain clearance to proceed with these procedures in the future.  Patient understood and agreed.  For now she can continue taking oral iron daily.  She is also presenting some intermittent episodes of chest discomfort and throat discomfort.  This will be better evaluated with an EGD.  She has not presented resolution of the symptoms while on PPI twice daily and famotidine.  For now she can continue taking this regimen until further endoscopic evaluation is performed.  As she has presented some chest discomfort/pain, I will refer her to cardiology for further evaluation.  -Continue oral iron daily -Continue pantoprazole 40 mg twice a day and famotidine at night for heartburn -Follow up with Dr. Fransico Him closely, will need to obtain clearance from him to proceed with EGD and colonoscopy for evaluation of anemia -Cardiology referral for evaluation of chest pain  All questions were answered.      Katrinka Blazing, MD Gastroenterology and Hepatology [facility]

## 2023-07-08 ENCOUNTER — Other Ambulatory Visit: Payer: Self-pay

## 2023-07-08 DIAGNOSIS — Z87891 Personal history of nicotine dependence: Secondary | ICD-10-CM

## 2023-07-08 DIAGNOSIS — Z122 Encounter for screening for malignant neoplasm of respiratory organs: Secondary | ICD-10-CM

## 2023-07-31 ENCOUNTER — Other Ambulatory Visit: Payer: Self-pay | Admitting: Allergy & Immunology

## 2023-07-31 ENCOUNTER — Other Ambulatory Visit: Payer: Self-pay

## 2023-07-31 NOTE — Progress Notes (Signed)
 Specialty Pharmacy Refill Coordination Note  [name] is a 58 y.o. female contacted today regarding refills of specialty medication(s) Benralizumab  (Fasenra  Pen)   Patient requested (Patient-Rptd) Delivery   Delivery date: (Patient-Rptd) 08/08/23   Verified address: (Patient-Rptd) [address] [place] [address]   Medication will be filled on 08/07/23.   Spoke with patient, this fill date is pending response to refill request from provider. Patient is aware and if they have not received fill by intended date they must follow up with pharmacy.

## 2023-08-04 ENCOUNTER — Other Ambulatory Visit: Payer: Self-pay | Admitting: "Endocrinology

## 2023-08-06 ENCOUNTER — Other Ambulatory Visit: Payer: Self-pay

## 2023-08-06 ENCOUNTER — Other Ambulatory Visit (HOSPITAL_COMMUNITY): Payer: Self-pay

## 2023-08-06 MED ORDER — FASENRA PEN 30 MG/ML ~~LOC~~ SOAJ
30.0000 mg | SUBCUTANEOUS | 6 refills | Status: AC
  Filled 2023-08-06: qty 1, 56d supply, fill #0
  Filled 2023-09-25: qty 1, 56d supply, fill #1
  Filled 2023-11-11: qty 1, 56d supply, fill #2
  Filled 2024-01-16: qty 1, 56d supply, fill #3
  Filled 2024-03-09: qty 1, 56d supply, fill #4
  Filled 2024-04-29 – 2024-05-14 (×4): qty 1, 56d supply, fill #5

## 2023-08-07 ENCOUNTER — Other Ambulatory Visit: Payer: Self-pay

## 2023-08-12 ENCOUNTER — Telehealth: Payer: Self-pay

## 2023-08-12 MED ORDER — FAMOTIDINE 20 MG PO TABS: ORAL_TABLET | ORAL | 11 refills | Status: DC

## 2023-08-12 NOTE — Telephone Encounter (Signed)
 Copied from CRM [id number]. Topic: Clinical - Prescription Issue >> Aug 09, 2023  3:51 PM Crist Dominion wrote: Reason for CRM: Please transfer patients famotidine  (PEPCID ) 20 MG tablet to:  [facility] [place] [address] [place] [address] Phone: [phone] Fax: [phone] Hours: Not open 24 hours   ATC x1 LDVM. Med has been sent to new pharmacy. Nothing further needed.

## 2023-08-21 ENCOUNTER — Telehealth: Payer: Self-pay

## 2023-08-21 NOTE — Telephone Encounter (Signed)
*  Pulm  Pharmacy Patient Advocate Encounter   Received notification from CoverMyMeds that prior authorization for Levalbuterol  Tartrate 45MCG/ACT aerosol  is required/requested.   Insurance verification completed.   The patient is insured through [facility] .   Per test claim:  Albuterol  HFA is preferred by the insurance.  If suggested medication is appropriate, Please send in a new RX and discontinue this one. If not, please advise as to why it's not appropriate so that we may request a Prior Authorization. Please note, some preferred medications may still require a PA.  If the suggested medications have not been trialed and there are no contraindications to their use, the PA will not be submitted, as it will not be approved.   CMM Key: [id number]

## 2023-08-21 NOTE — Telephone Encounter (Signed)
 Please advise if OK to switch to Albuterol  HFA

## 2023-08-22 NOTE — Telephone Encounter (Signed)
 It looks like this is DR Wert's patient?  He would have to approve this change

## 2023-08-22 NOTE — Telephone Encounter (Signed)
 Dr. Baldwin Levee please disregard that message sent to wrong provider.    Dr. Waymond Hailey please advise of change!

## 2023-08-22 NOTE — Telephone Encounter (Signed)
 That's fine but should try to reduce the dose if possible to one puff q 4 h prn and be sure has f/u ov next avail to me

## 2023-08-23 ENCOUNTER — Other Ambulatory Visit: Payer: Self-pay

## 2023-08-23 MED ORDER — MONTELUKAST SODIUM 10 MG PO TABS: 10.0000 mg | ORAL_TABLET | Freq: Every day | ORAL | 1 refills | Status: DC

## 2023-08-23 MED ORDER — XOPENEX HFA 45 MCG/ACT IN AERO: 2.0000 | INHALATION_SPRAY | Freq: Four times a day (QID) | RESPIRATORY_TRACT | 2 refills | Status: DC | PRN

## 2023-08-23 NOTE — Telephone Encounter (Signed)
 LVM to inform patient that the switch was okay, and will forward to front desk to schedule first available with Dr.Wert.

## 2023-08-27 ENCOUNTER — Ambulatory Visit: Admitting: "Endocrinology

## 2023-08-27 ENCOUNTER — Encounter: Payer: Self-pay | Admitting: "Endocrinology

## 2023-08-27 VITALS — BP 96/64 | HR 80 | Ht 64.0 in | Wt 140.8 lb

## 2023-08-27 DIAGNOSIS — E2740 Unspecified adrenocortical insufficiency: Secondary | ICD-10-CM | POA: Diagnosis not present

## 2023-08-27 DIAGNOSIS — E039 Hypothyroidism, unspecified: Secondary | ICD-10-CM

## 2023-08-27 DIAGNOSIS — E782 Mixed hyperlipidemia: Secondary | ICD-10-CM

## 2023-08-27 DIAGNOSIS — R7303 Prediabetes: Secondary | ICD-10-CM

## 2023-08-27 LAB — POCT GLYCOSYLATED HEMOGLOBIN (HGB A1C): HbA1c, POC (controlled diabetic range): 5.6 % (ref 0.0–7.0)

## 2023-08-27 LAB — COMPREHENSIVE METABOLIC PANEL WITH GFR
ALT: 17 IU/L (ref 0–32)
AST: 18 IU/L (ref 0–40)
Albumin: 4.5 g/dL (ref 3.8–4.9)
Alkaline Phosphatase: 58 IU/L (ref 44–121)
BUN/Creatinine Ratio: 13 (ref 9–23)
BUN: 13 mg/dL (ref 6–24)
Bilirubin Total: 0.4 mg/dL (ref 0.0–1.2)
CO2: 25 mmol/L (ref 20–29)
Calcium: 9.5 mg/dL (ref 8.7–10.2)
Chloride: 102 mmol/L (ref 96–106)
Creatinine, Ser: 1 mg/dL (ref 0.57–1.00)
Globulin, Total: 1.9 g/dL (ref 1.5–4.5)
Glucose: 86 mg/dL (ref 70–99)
Potassium: 3.8 mmol/L (ref 3.5–5.2)
Sodium: 143 mmol/L (ref 134–144)
Total Protein: 6.4 g/dL (ref 6.0–8.5)
eGFR: 65 mL/min/{1.73_m2} (ref >59)

## 2023-08-27 LAB — LIPID PANEL
Chol/HDL Ratio: 2 ratio (ref 0.0–4.4)
Cholesterol, Total: 163 mg/dL (ref 100–199)
HDL: 81 mg/dL (ref >39)
LDL Chol Calc (NIH): 68 mg/dL (ref 0–99)
Triglycerides: 77 mg/dL (ref 0–149)
VLDL Cholesterol Cal: 14 mg/dL (ref 5–40)

## 2023-08-27 LAB — T3, FREE: T3, Free: 2.4 pg/mL (ref 2.0–4.4)

## 2023-08-27 LAB — TSH: TSH: 79.8 u[IU]/mL — ABNORMAL HIGH (ref 0.450–4.500)

## 2023-08-27 LAB — T4, FREE: Free T4: <0.1 ng/dL — ABNORMAL LOW (ref 0.82–1.77)

## 2023-08-27 NOTE — Progress Notes (Signed)
 08/27/2023, 2:40 PM   Endocrinology follow-up note  Subjective:    Patient ID: [name], female    DOB: 1966/02/04, PCP Omie Bickers, MD   Past Medical History:  Diagnosis Date   Anemia    Anxiety    Arthritis    Asthma    Cervical radiculopathy    Cluster headaches    COPD (chronic obstructive pulmonary disease) (HCC)    COPD (chronic obstructive pulmonary disease) with chronic bronchitis (HCC)    GERD (gastroesophageal reflux disease)    Hyperlipemia    Hypoglycemia    Hypothyroidism    Migraine    Pneumothorax    PONV (postoperative nausea and vomiting)    Pre-diabetes    Rectal discharge 07/28/2010   Shortness of breath    Sleep apnea    cannot tolerate-not using CPAP   Sluggishness 07/28/2010   Past Surgical History:  Procedure Laterality Date   ABDOMINAL HYSTERECTOMY     with bladder suspension   CERVICAL FUSION     CHOLECYSTECTOMY     COLONOSCOPY N/A 07/30/2013   Procedure: COLONOSCOPY;  Surgeon: Ruby Corporal, MD;  Location: [facility];  Service: Endoscopy;  Laterality: N/A;  930   EXAM UNDER ANESTHESIA WITH MANIPULATION OF SHOULDER Right 10/11/2016   Procedure: EXAM UNDER ANESTHESIA WITH MANIPULATION OF SHOULDER;  Surgeon: Darrin Emerald, MD;  Location: [facility];  Service: Orthopedics;  Laterality: Right;   HERNIA REPAIR     INCISIONAL HERNIA REPAIR N/A 07/30/2016   Procedure: HERNIA REPAIR INCISIONAL WITH MESH;  Surgeon: Alanda Allegra, MD;  Location: [facility];  Service: General;  Laterality: N/A;   INTERSTIM IMPLANT PLACEMENT  02/02/2021   Procedure: Simona [place] IMPLANT FIRST STAGE LEFT SACRUM ;  Surgeon: Marco Severs, MD;  Location: [facility];  Service: Urology;;   Simona Dublin IMPLANT PLACEMENT N/A 02/23/2021   Procedure: Simona [place] IMPLANT SECOND STAGE;  Surgeon: Marco Severs, MD;  Location: [facility];  Service: Urology;  Laterality: N/A;  Rep coming, time needs to stay at 2:00    INTERSTIM IMPLANT REVISION N/A 10/25/2022   Procedure: REVISION OF Simona Dublin;  Surgeon: Marco Severs, MD;  Location: [facility];  Service: Urology;  Laterality: N/A;   JOINT REPLACEMENT     LOBECTOMY     right lung    POSTERIOR CERVICAL LAMINECTOMY Right 04/05/2017   Procedure: Right C6-7 Posterior cervical laminectomy;  Surgeon: Gearl Keens, MD;  Location: [facility] OR;  Service: Neurosurgery;  Laterality: Right;  Right C6-7 Posterior cervical laminectomy   SHOULDER ARTHROSCOPY WITH ROTATOR CUFF REPAIR Right 10/11/2016   Procedure: SHOULDER ARTHROSCOPY;  Surgeon: Darrin Emerald, MD;  Location: [facility];  Service: Orthopedics;  Laterality: Right;   TOTAL HIP ARTHROPLASTY Right 04/15/2020   Procedure: RIGHT TOTAL HIP ARTHROPLASTY ANTERIOR APPROACH;  Surgeon: Arnie Lao, MD;  Location: [facility];  Service: Orthopedics;  Laterality: Right;   TOTAL HIP ARTHROPLASTY Left 07/22/2020   Procedure: LEFT  HIP ARTHROPLASTY ANTERIOR APPROACH;  Surgeon: Arnie Lao, MD;  Location: [facility];  Service: Orthopedics;  Laterality: Left;  RNFA   TUBAL LIGATION     Social History   Socioeconomic History   Marital status: Married  Spouse name: Not on file   Number of children: Not on file   Years of education: Not on file   Highest education level: Not on file  Occupational History   Not on file  Tobacco Use   Smoking status: Former    Current packs/day: 0.00    Average packs/day: 1 pack/day for 30.0 years (30.0 ttl pk-yrs)    Types: Cigarettes    Start date: 02/20/1978    Quit date: 02/21/2008    Years since quitting: 15.5   Smokeless tobacco: Never  Vaping Use   Vaping status: Never Used  Substance and Sexual Activity   Alcohol use: No    Alcohol/week: 0.0 standard drinks of alcohol    Comment: no   Drug use: No   Sexual activity: Not Currently    Partners: Male    Birth control/protection: Surgical  Other Topics Concern   Not on file  Social History Narrative   Are you  right handed or left handed? Right handed   Are you currently employed ?    What is your current occupation?   Do you live at home alone? NO    Who lives with you? Son   What type of home do you live in: 1 story or 2 story? 1       Social Drivers of Community education officer: Not on file  Food Insecurity: Not on file  Transportation Needs: Not on file  Physical Activity: Not on file  Stress: Not on file  Social Connections: Not on file   Family History  Problem Relation Age of Onset   Neuropathy Mother    Migraines Mother    Cancer Mother    COPD Mother    Diabetes Mother    Hyperlipidemia Mother    Hypertension Mother    Hyperlipidemia Father    Hypertension Father    Migraines Sister    Migraines Maternal Aunt    Stroke Maternal Grandmother    Heart failure Maternal Grandmother    Hypertension Maternal Grandmother    Thyroid  disease Maternal Grandmother    Diabetes Paternal Grandmother    Alzheimer's disease Paternal Grandmother    Stroke Paternal Grandfather    Heart failure Paternal Grandfather    Hypertension Paternal Grandfather    Bipolar disorder Son    Colon cancer Neg Hx    Outpatient Encounter Medications as of 08/27/2023  Medication Sig   acetaminophen  (TYLENOL ) 500 MG tablet Take 1,000 mg by mouth every 6 (six) hours as needed for moderate pain.   albuterol  (PROVENTIL ) (2.5 MG/3ML) 0.083% nebulizer solution USE 1 VIAL IN NEBULIZER EVERY 6 HOURS AS NEEDED FOR WHEEZING OR SHORTNESS OF BREATH   atorvastatin (LIPITOR) 10 MG tablet Take 1 tablet by mouth daily.   benralizumab  (FASENRA  PEN) 30 MG/ML prefilled autoinjector Inject 1 mL (30 mg total) into the skin every 8 (eight) weeks.   Budeson-Glycopyrrol-Formoterol  (BREZTRI  AEROSPHERE) 160-9-4.8 MCG/ACT AERO Inhale 2 puffs into the lungs in the morning and at bedtime.   cetirizine (ZYRTEC) 10 MG tablet Take 10 mg by mouth daily.   Cholecalciferol (VITAMIN D3 SUPER STRENGTH PO) Take 5,000 Units by  mouth daily.   citalopram  (CELEXA ) 20 MG tablet Take 20 mg by mouth daily.   famotidine  (PEPCID ) 20 MG tablet One an hour before bed   FEROSUL 325 (65 Fe) MG tablet Take 325 mg by mouth daily.   fludrocortisone (FLORINEF) 0.1 MG tablet Take 0.2 mg by mouth daily.   Lancets (  ONETOUCH DELICA PLUS LANCET33G) MISC Apply topically.   latanoprost (XALATAN) 0.005 % ophthalmic solution Place 1 drop into both eyes at bedtime.   levothyroxine  (SYNTHROID ) 50 MCG tablet Take 50 mcg by mouth daily at 6 (six) AM. Takes with a 200 mg daily   Levothyroxine  Sodium (TIROSINT ) 200 MCG CAPS Take 200 mcg by mouth daily before breakfast.   liothyronine  (CYTOMEL ) 5 MCG tablet TAKE 2 TABLETS BY MOUTH TWICE DAILY AT  8  AM  AND  10  PM.   montelukast  (SINGULAIR ) 10 MG tablet Take 1 tablet (10 mg total) by mouth at bedtime.   Multiple Vitamin (MULTIVITAMIN WITH MINERALS) TABS tablet Take 1 tablet by mouth daily.   pantoprazole  (PROTONIX ) 40 MG tablet Take 30- 60 min before your first and last meals of the day   predniSONE  (DELTASONE ) 5 MG tablet Take 2 tablets (10 mg total) by mouth daily with breakfast. (Patient taking differently: Take by mouth daily with breakfast.)   pregabalin  (LYRICA ) 75 MG capsule Take 75 mg by mouth 2 (two) times daily.   Spacer/Aero-Holding Chambers DEVI 1 each by Does not apply route daily as needed.   SUMAtriptan  6 MG/0.5ML SOAJ Inject 6 mg as directed daily as needed (migraine).    Triamcinolone  Acetonide (NASACORT  ALLERGY  24HR NA) Place 2 sprays into the nose daily as needed (allergies).   valACYclovir  (VALTREX ) 500 MG tablet Take 500-2,000 mg by mouth See admin instructions. Take 2000mg s at first sign of fever blister outbreak then take 500mg s daily until gone   XOPENEX  HFA 45 MCG/ACT inhaler Inhale 2 puffs into the lungs every 6 (six) hours as needed for wheezing.   No facility-administered encounter medications on file as of 08/27/2023.   ALLERGIES: Allergies  Allergen Reactions    Aspirin  Shortness Of Breath    Causes asthma flares    Penicillins Other (See Comments)    UNSPECIFIED REACTION OF CHILDHOOD  Has patient had a PCN reaction causing immediate rash, facial/tongue/throat swelling, SOB or lightheadedness with hypotension: NO Has patient had a PCN reaction causing severe rash involving mucus membranes or skin necrosis: NO Has patient had a PCN reaction that required hospitalization: no Has patient had a PCN reaction occurring within the last 10 years: NO If all of the above answers are "NO", then may proceed with Cephalosporin use.    Vicodin [Hydrocodone -Acetaminophen ] Other (See Comments)    Headaches     VACCINATION STATUS: Immunization History  Administered Date(s) Administered   Influenza,inj,Quad PF,6+ Mos 04/13/2021   Influenza,inj,Quad PF,6-35 Mos 01/28/2020   Influenza,inj,quad, With Preservative 02/07/2022   Influenza-Unspecified 02/15/2017, 04/13/2019, 04/13/2021   PFIZER(Purple Top)SARS-COV-2 Vaccination 06/21/2019, 07/22/2019, 03/09/2020   PNEUMOCOCCAL CONJUGATE-20 04/13/2021    HPI [name] is 58 y.o. female who presents today with a medical history as above. she is being seen in follow-up after she was seen in consultation for steroid induced adrenal suppression.  She also has hypothyroidism due to Hashimoto's thyroiditis.  More recently it has been difficult to regulate her thyroid  function tests despite high dose of levothyroxine /Synthroid .   Recently she was given a prescription for Tirosint  200 mcg p.o. daily . She is using Tirosint  200 mcg, levothyroxine  50 mcg p.o. daily along with Cytomel  10 mcg p.o. twice daily.    She returns with symptomatic improvement, correction of free T3, and better TSH however still undetectable free T4.  For her adrenal insufficiency, she was on ongoing prednisone , currently 5 mg and fludrocortisone 0.2 mg p.o. daily.  She denies dizziness,  her blood pressure today is 96/64 mmHg  See notes  from her previous visit.  She has been treated with prednisone  more or less continuously for the last 15 years.   She denies any history of adrenal crisis.   She denies diarrhea nor vomiting.  She was offered surveillance CT abdomen/pelvis to rule out consumptive hypothyroidism which returns negative findings. -  She reports fatigue and cold intolerance.  She confirms to me appropriate intake of her thyroid  medications consistently.  Her Tirosint  will require prior authorization.       She has history of heavy smoking, complicated by COPD on ongoing inhalers including albuterol , budesonide , and Perforomist .  She is also on oxygen  supplement 24/7. She denies any history of adrenal injury, penetrating abdominal trauma.  -  She also has hyperlipidemia and prediabetes.  - Her most recent bone density from March 25, 2019 was consistent with osteopenia of the spine, normal bone density on the left forearm radius.    Review of Systems  Constitutional: + Steady weight,    + fatigue, no subjective hyperthermia, no subjective hypothermia Eyes: no blurry vision, no xerophthalmia  Respiratory: + On continuous oxygen  supplement.   Status post partial pneumonectomy in 2004.   Objective:       08/27/2023   11:15 AM 07/01/2023   11:29 AM 06/19/2023   10:30 AM  Vitals with BMI  Height 5\' 4"  5\' 4"  5\' 4"   Weight 140 lbs 13 oz 150 lbs 10 oz 147 lbs 13 oz  BMI 24.16 25.84 25.36  Systolic 96 103 86  Diastolic 64 69 66  Pulse 80 75 76    BP 96/64   Pulse 80   Ht 5\' 4"  (1.626 m)   Wt 140 lb 12.8 oz (63.9 kg)   BMI 24.17 kg/m   Wt Readings from Last 3 Encounters:  08/27/23 140 lb 12.8 oz (63.9 kg)  07/01/23 150 lb 9.6 oz (68.3 kg)  06/19/23 147 lb 12.8 oz (67 kg)    Physical Exam  Constitutional:  Body mass index is 24.17 kg/m.,  not in acute distress, normal state of mind    CMP ( most recent) CMP     Component Value Date/Time   NA 143 08/20/2023 0806   K 3.8 08/20/2023 0806    CL 102 08/20/2023 0806   CO2 25 08/20/2023 0806   GLUCOSE 86 08/20/2023 0806   GLUCOSE 104 (H) 10/30/2021 0958   BUN 13 08/20/2023 0806   CREATININE 1.00 08/20/2023 0806   CALCIUM  9.5 08/20/2023 0806   CALCIUM  9.3 05/16/2023 0000   PROT 6.4 08/20/2023 0806   ALBUMIN 4.5 08/20/2023 0806   AST 18 08/20/2023 0806   ALT 17 08/20/2023 0806   ALKPHOS 58 08/20/2023 0806   BILITOT 0.4 08/20/2023 0806   GFRNONAA >60 10/30/2021 0958   GFRAA >60 03/06/2019 0931     Diabetic Labs (most recent): Lab Results  Component Value Date   HGBA1C 5.6 08/27/2023   HGBA1C 5.7 02/08/2023   HGBA1C 5.6 01/24/2023     Lab Results  Component Value Date   TSH 79.800 (H) 08/20/2023   TSH 97.700 (H) 06/13/2023   TSH 126.00 (A) 05/16/2023   TSH 95.700 (H) 04/17/2023   TSH 117.000 (H) 04/01/2023   TSH 121.000 (H) 02/14/2023   TSH 127.000 (H) 01/18/2023   TSH 123.000 (H) 11/27/2022   TSH 0.444 (L) 10/03/2022   TSH 0.129 (L) 04/05/2022   FREET4 <0.10 (L) 08/20/2023   FREET4 <0.10 (L) 06/13/2023  FREET4 0.10 05/16/2023   FREET4 0.26 (L) 04/17/2023   FREET4 0.15 (L) 04/01/2023   FREET4 0.10 (L) 02/14/2023   FREET4 0.10 (L) 01/18/2023   FREET4 0.10 (L) 11/27/2022   FREET4 1.60 10/03/2022   FREET4 1.48 04/05/2022     Lipid Panel     Component Value Date/Time   CHOL 163 08/20/2023 0806   TRIG 77 08/20/2023 0806   HDL 81 08/20/2023 0806   CHOLHDL 2.0 08/20/2023 0806   LDLCALC 68 08/20/2023 0806   LABVLDL 14 08/20/2023 0806    CT of abdomen and pelvis without contrast on March 06, 2023 IMPRESSION: 1. No findings to explain the patient's clinical history. 2. Trace pericardial fluid. 3. Punctate right renal stone. 4.  Aortic atherosclerosis (ICD10-I70.0).   Thyroid  ultrasound on April 25, 2023 No discrete nodules are seen within the thyroid  gland.   IMPRESSION: Markedly heterogeneous and small/atrophied thyroid  gland consistent with the clinical history of hypothyroidism and  presumed long-standing exogenous thyroid  hormone replacement therapy.  No thyroid  nodules.  Assessment & Plan:   1.  Hypothyroidism -due to Hashimoto's thyroiditis 2.  Steroid induced adrenal suppression  3.  Prediabetes 4.  Hyperlipidemia   - I have reviewed her recent and available endocrine records and clinically evaluated the patient.  Reviewing her recent labs indicate that the cause of her hypothyroidism is Hashimoto's thyroiditis.  Her previsit thyroid  function tests remain consistent with significantly  inadequate replacement.  -Malabsorption is likely reason behind difficulty to regulate her thyroid  function test. -She is advised to continue Tirosint  200 mcg, levothyroxine  50 mcg p.o. p.o. daily before breakfast as well as Cytomel  10 mcg p.o. twice daily-before breakfast and before going to bed.  -Consumptive hypothyroidism seems to be unlikely with negative CT abdomen/pelvis.  She is at risk for imminent severe hypothyroidism/myxedema.   - We discussed about the correct intake of her thyroid  hormone, on empty stomach at fasting, with water , separated by at least 30 minutes from breakfast and other medications,  and separated by more than 4 hours from calcium , iron, multivitamins, acid reflux medications (PPIs). -Patient is made aware of the fact that thyroid  hormone replacement is needed for life, dose to be adjusted by periodic monitoring of thyroid  function tests.   -Her surveillance thyroid /neck ultrasound was unremarkable.   - she has adrenal suppression due to chronic steroid treatment related to her COPD/asthma.  This patient will likely need lifelong replacement dose steroids, glucocorticoids for now.  She is advised to continue prednisone  , lowest effective dose, currently at 5 mg p.o. daily at breakfast.  Her point-of-care A1c is 5.6% indicating absence of prediabetes/diabetes.     -Her blood pressure is stable at 96/64, improving from 86/66 during her last visit.   She is advised to continue Florinef  0.2 mg p.o. daily at breakfast.   It is unsafe, unnecessary to withdraw prednisone  to test her for adrenal insufficiency.  -She wears a medical alert for  depicting her adrenal insufficiency and the need for steroids.    Regarding her dyslipidemia: She presents with significant improvement in her lipid panel with LDL at 68 improving from 155.    She is advised to continue atorvastatin 10 mg p.o. nightly.  Side effects and precautions discussed with her.     - she is advised to maintain close follow up with Omie Bickers, MD for primary care needs.    I spent  26  minutes in the care of the patient today including review of labs  from Thyroid  Function, CMP, and other relevant labs ; imaging/biopsy records (current and previous including abstractions from other facilities); face-to-face time discussing  her lab results and symptoms, medications doses, her options of short and long term treatment based on the latest standards of care / guidelines;   and documenting the encounter.  Selmer Dally  participated in the discussions, expressed understanding, and voiced agreement with the above plans.  All questions were answered to her satisfaction. she is encouraged to contact clinic should she have any questions or concerns prior to her return visit.   Follow up plan: Return in about 6 weeks (around 10/08/2023) for F/U with Pre-visit Labs.   Kalvin Orf, MD [facility] [address] [place], [place] [address] Phone: [phone]  Fax: [phone]     08/27/2023, 2:40 PM  This note was partially dictated with voice recognition software. Similar sounding words can be transcribed inadequately or may not  be corrected upon review.

## 2023-09-05 ENCOUNTER — Ambulatory Visit (HOSPITAL_COMMUNITY)
Admission: RE | Admit: 2023-09-05 | Discharge: 2023-09-05 | Disposition: A | Discharge status: Home / Self Care | Source: Ambulatory Visit | Attending: Acute Care | Admitting: Acute Care

## 2023-09-05 DIAGNOSIS — Z122 Encounter for screening for malignant neoplasm of respiratory organs: Secondary | ICD-10-CM | POA: Insufficient documentation

## 2023-09-05 DIAGNOSIS — I700 Atherosclerosis of aorta: Secondary | ICD-10-CM | POA: Diagnosis not present

## 2023-09-05 DIAGNOSIS — Z87891 Personal history of nicotine dependence: Secondary | ICD-10-CM | POA: Insufficient documentation

## 2023-09-05 DIAGNOSIS — J439 Emphysema, unspecified: Secondary | ICD-10-CM | POA: Diagnosis not present

## 2023-09-09 ENCOUNTER — Other Ambulatory Visit (HOSPITAL_COMMUNITY): Payer: Self-pay | Admitting: Internal Medicine

## 2023-09-09 DIAGNOSIS — Z1231 Encounter for screening mammogram for malignant neoplasm of breast: Secondary | ICD-10-CM

## 2023-09-17 ENCOUNTER — Encounter: Payer: Self-pay | Admitting: Internal Medicine

## 2023-09-17 ENCOUNTER — Ambulatory Visit: Attending: Internal Medicine | Admitting: Internal Medicine

## 2023-09-17 VITALS — BP 114/62 | HR 87 | Ht 64.0 in | Wt 141.8 lb

## 2023-09-17 DIAGNOSIS — R079 Chest pain, unspecified: Secondary | ICD-10-CM | POA: Diagnosis not present

## 2023-09-17 MED ORDER — NITROGLYCERIN 0.4 MG SL SUBL: 0.4000 mg | SUBLINGUAL_TABLET | SUBLINGUAL | 2 refills | Status: DC | PRN

## 2023-09-17 NOTE — Progress Notes (Signed)
 Cardiology Office Note  Date: 09/17/2023   ID: Melissa, Watson 31-Mar-1966, MRN [id number]  PCP:  Omie Bickers, MD  Cardiologist:  Lasalle Pointer, MD Electrophysiologist:  None   History of Present Illness: [name] is a 58 y.o. female  Referred to cardiology clinic for evaluation of chest pain.  Patient has burning chest pain occurring at rest and at night while lying down.  Currently follows with GI and has EGD scheduled.  Currently on famotidine  and PPI twice daily but she does not have any relief in her chest pain symptoms.  No chest pain with exertion but she is not active either.  Has COPD, on home oxygen  since 2020.  Diagnosed with COPD more than a decade ago.  Has some dizziness but thinks this could be from balance issues.  No syncope, leg swelling.  She underwent echocardiogram and NM stress test in 2021, both were unremarkable.  She also underwent event monitor in 2021 that was unremarkable as well.  Past Medical History:  Diagnosis Date   Anemia    Anxiety    Arthritis    Asthma    Cervical radiculopathy    Cluster headaches    COPD (chronic obstructive pulmonary disease) (HCC)    COPD (chronic obstructive pulmonary disease) with chronic bronchitis (HCC)    GERD (gastroesophageal reflux disease)    Hyperlipemia    Hypoglycemia    Hypothyroidism    Migraine    Pneumothorax    PONV (postoperative nausea and vomiting)    Pre-diabetes    Rectal discharge 07/28/2010   Shortness of breath    Sleep apnea    cannot tolerate-not using CPAP   Sluggishness 07/28/2010    Past Surgical History:  Procedure Laterality Date   ABDOMINAL HYSTERECTOMY     with bladder suspension   CERVICAL FUSION     CHOLECYSTECTOMY     COLONOSCOPY N/A 07/30/2013   Procedure: COLONOSCOPY;  Surgeon: Ruby Corporal, MD;  [facility]: [facility];  Service: Endoscopy;  Laterality: N/A;  930   EXAM UNDER ANESTHESIA WITH MANIPULATION OF SHOULDER Right 10/11/2016    Procedure: EXAM UNDER ANESTHESIA WITH MANIPULATION OF SHOULDER;  Surgeon: Darrin Emerald, MD;  Location: [facility];  Service: Orthopedics;  Laterality: Right;   HERNIA REPAIR     INCISIONAL HERNIA REPAIR N/A 07/30/2016   Procedure: HERNIA REPAIR INCISIONAL WITH MESH;  Surgeon: Alanda Allegra, MD;  Location: [facility];  Service: General;  Laterality: N/A;   INTERSTIM IMPLANT PLACEMENT  02/02/2021   Procedure: Simona Dublin IMPLANT FIRST STAGE LEFT SACRUM ;  Surgeon: Marco Severs, MD;  Location: [facility];  Service: Urology;;   Simona Dublin IMPLANT PLACEMENT N/A 02/23/2021   Procedure: Simona Dublin IMPLANT SECOND STAGE;  Surgeon: Marco Severs, MD;  Location: [facility];  Service: Urology;  Laterality: N/A;  Rep coming, time needs to stay at 2:00   INTERSTIM IMPLANT REVISION N/A 10/25/2022   Procedure: REVISION OF Simona Dublin;  Surgeon: Marco Severs, MD;  Location: [facility];  Service: Urology;  Laterality: N/A;   JOINT REPLACEMENT     LOBECTOMY     right lung    POSTERIOR CERVICAL LAMINECTOMY Right 04/05/2017   Procedure: Right C6-7 Posterior cervical laminectomy;  Surgeon: Gearl Keens, MD;  Location: [facility] OR;  Service: Neurosurgery;  Laterality: Right;  Right C6-7 Posterior cervical laminectomy   SHOULDER ARTHROSCOPY WITH ROTATOR CUFF REPAIR Right 10/11/2016   Procedure: SHOULDER ARTHROSCOPY;  Surgeon: Darrin Emerald, MD;  Location: [facility];  Service: Orthopedics;  Laterality: Right;   TOTAL HIP ARTHROPLASTY Right 04/15/2020   Procedure: RIGHT TOTAL HIP ARTHROPLASTY ANTERIOR APPROACH;  Surgeon: Arnie Lao, MD;  Location: [facility];  Service: Orthopedics;  Laterality: Right;   TOTAL HIP ARTHROPLASTY Left 07/22/2020   Procedure: LEFT  HIP ARTHROPLASTY ANTERIOR APPROACH;  Surgeon: Arnie Lao, MD;  Location: [facility];  Service: Orthopedics;  Laterality: Left;  RNFA   TUBAL LIGATION      Current Outpatient Medications  Medication Sig Dispense Refill   acetaminophen  (TYLENOL ) 500  MG tablet Take 1,000 mg by mouth every 6 (six) hours as needed for moderate pain.     albuterol  (PROVENTIL ) (2.5 MG/3ML) 0.083% nebulizer solution USE 1 VIAL IN NEBULIZER EVERY 6 HOURS AS NEEDED FOR WHEEZING OR SHORTNESS OF BREATH 75 mL 0   atorvastatin (LIPITOR) 10 MG tablet Take 1 tablet by mouth daily.     benralizumab  (FASENRA  PEN) 30 MG/ML prefilled autoinjector Inject 1 mL (30 mg total) into the skin every 8 (eight) weeks. 1 mL 6   Budeson-Glycopyrrol-Formoterol  (BREZTRI  AEROSPHERE) 160-9-4.8 MCG/ACT AERO Inhale 2 puffs into the lungs in the morning and at bedtime. 3 each 4   cetirizine (ZYRTEC) 10 MG tablet Take 10 mg by mouth daily.     Cholecalciferol (VITAMIN D3 SUPER STRENGTH PO) Take 5,000 Units by mouth daily.     citalopram  (CELEXA ) 20 MG tablet Take 20 mg by mouth daily.     famotidine  (PEPCID ) 20 MG tablet One an hour before bed 30 tablet 11   FEROSUL 325 (65 Fe) MG tablet Take 325 mg by mouth daily.     fludrocortisone (FLORINEF) 0.1 MG tablet Take 0.2 mg by mouth daily.     Lancets (ONETOUCH DELICA PLUS LANCET33G) MISC Apply topically.     latanoprost (XALATAN) 0.005 % ophthalmic solution Place 1 drop into both eyes at bedtime.     levothyroxine  (SYNTHROID ) 50 MCG tablet Take 50 mcg by mouth daily at 6 (six) AM. Takes with a 200 mg daily     Levothyroxine  Sodium (TIROSINT ) 200 MCG CAPS Take 200 mcg by mouth daily before breakfast. 90 capsule 1   liothyronine  (CYTOMEL ) 5 MCG tablet TAKE 2 TABLETS BY MOUTH TWICE DAILY AT  8  AM  AND  10  PM. 120 tablet 1   montelukast  (SINGULAIR ) 10 MG tablet Take 1 tablet (10 mg total) by mouth at bedtime. 90 tablet 1   Multiple Vitamin (MULTIVITAMIN WITH MINERALS) TABS tablet Take 1 tablet by mouth daily.     pantoprazole  (PROTONIX ) 40 MG tablet Take 30- 60 min before your first and last meals of the day     predniSONE  (DELTASONE ) 5 MG tablet Take 2 tablets (10 mg total) by mouth daily with breakfast. (Patient taking differently: Take by mouth  daily with breakfast.) 180 tablet 1   pregabalin  (LYRICA ) 75 MG capsule Take 75 mg by mouth 2 (two) times daily.     Spacer/Aero-Holding Chambers DEVI 1 each by Does not apply route daily as needed. 1 each 2   SUMAtriptan  6 MG/0.5ML SOAJ Inject 6 mg as directed daily as needed (migraine).      Triamcinolone  Acetonide (NASACORT  ALLERGY  24HR NA) Place 2 sprays into the nose daily as needed (allergies).     valACYclovir  (VALTREX ) 500 MG tablet Take 500-2,000 mg by mouth See admin instructions. Take 2000mg s at first sign of fever blister outbreak then take 500mg s daily until gone     XOPENEX   HFA 45 MCG/ACT inhaler Inhale 2 puffs into the lungs every 6 (six) hours as needed for wheezing. 15 g 2   No current facility-administered medications for this visit.   Allergies:  Aspirin , Penicillins, and Vicodin [hydrocodone -acetaminophen ]   Social History: The patient  reports that she quit smoking about 15 years ago. Her smoking use included cigarettes. She started smoking about 45 years ago. She has a 30 pack-year smoking history. She has never used smokeless tobacco. She reports that she does not drink alcohol and does not use drugs.   Family History: The patient's family history includes Alzheimer's disease in her paternal grandmother; Bipolar disorder in her son; COPD in her mother; Cancer in her mother; Diabetes in her mother and paternal grandmother; Heart failure in her maternal grandmother and paternal grandfather; Hyperlipidemia in her father and mother; Hypertension in her father, maternal grandmother, mother, and paternal grandfather; Migraines in her maternal aunt, mother, and sister; Neuropathy in her mother; Stroke in her maternal grandmother and paternal grandfather; Thyroid  disease in her maternal grandmother.   ROS:  Please see the history of present illness. Otherwise, complete review of systems is positive for none  All other systems are reviewed and negative.   Physical Exam: VS:  BP  114/62   Pulse 87   Ht 5\' 4"  (1.626 m)   Wt 141 lb 12.8 oz (64.3 kg)   SpO2 97% Comment: 3 l/min 24/7  BMI 24.34 kg/m , BMI Body mass index is 24.34 kg/m.  Wt Readings from Last 3 Encounters:  09/17/23 141 lb 12.8 oz (64.3 kg)  08/27/23 140 lb 12.8 oz (63.9 kg)  07/01/23 150 lb 9.6 oz (68.3 kg)    General: Patient appears comfortable at rest. HEENT: Conjunctiva and lids normal, oropharynx clear with moist mucosa. Neck: Supple, no elevated JVP or carotid bruits, no thyromegaly. Lungs: Clear to auscultation, nonlabored breathing at rest. Cardiac: Regular rate and rhythm, no S3 or significant systolic murmur, no pericardial rub. Abdomen: Soft, nontender, no hepatomegaly, bowel sounds present, no guarding or rebound. Extremities: No pitting edema, distal pulses 2+. Skin: Warm and dry. Musculoskeletal: No kyphosis. Neuropsychiatric: Alert and oriented x3, affect grossly appropriate.  Recent Labwork: 08/20/2023: ALT 17; AST 18; BUN 13; Creatinine, Ser 1.00; Potassium 3.8; Sodium 143; TSH 79.800     Component Value Date/Time   CHOL 163 08/20/2023 0806   TRIG 77 08/20/2023 0806   HDL 81 08/20/2023 0806   CHOLHDL 2.0 08/20/2023 0806   LDLCALC 68 08/20/2023 0806    Assessment and Plan:  Chest pain: Ongoing chronic chest pain, occurs at rest and at night while lying down and not with exertion.  Not much active at home.  She thinks this could be from GERD but she is currently on famotidine  and pantoprazole  twice daily with no relief in her chest pains. NM stress test in 2021 ruled out ischemia.  Unclear if this is coronary vasospasm.  She does not have SL NTG with her, will prescribe. Will obtain stress cardiac PET scan, rule out coronary microvascular dysfunction.  Update echocardiogram.  Prior echocardiogram in 2021 was unremarkable.  HLD, at goal: Continue atorvastatin 10 mg at bedtime.  Goal LDL less than 100.       Medication Adjustments/Labs and Tests Ordered: Current medicines  are reviewed at length with the patient today.  Concerns regarding medicines are outlined above.    Disposition:  Follow up pending results  Signed Yaden Seith Priya Huel Centola, MD, 09/17/2023 2:02 PM    [facility] [address] [place], [place], [place] [address]

## 2023-09-17 NOTE — Patient Instructions (Addendum)
 Medication Instructions:  Your physician has recommended you make the following change in your medication:  Start taking Nitroglycerin 0.4 mg Dissolve one under tongue for chest pain every 5 minutes up to 3 doses. If no relief, proceed to ED or call 911. Continue taking all other medications as prescribed   Labwork: None  Testing/Procedures:    Please report to Radiology at the [facility] Main Entrance 30 minutes early for your test.  [address] [place], [place] [address]  How to Prepare for Your Cardiac PET/CT Stress Test:  Nothing to eat or drink, except water , 3 hours prior to arrival time.  NO caffeine /decaffeinated products, or chocolate 12 hours prior to arrival. (Please note decaffeinated beverages (teas/coffees) still contain caffeine ).  If you have caffeine  within 12 hours prior, the test will need to be rescheduled.  Medication instructions: Do not take erectile dysfunction medications for 72 hours prior to test (sildenafil, tadalafil) Do not take nitrates (isosorbide mononitrate, Ranexa) the day before or day of test Do not take tamsulosin the day before or morning of test Hold theophylline containing medications for 12 hours. Hold Dipyridamole 48 hours prior to the test.  Diabetic Preparation: If able to eat breakfast prior to 3 hour fasting, you may take all medications, including your insulin. Do not worry if you miss your breakfast dose of insulin - start at your next meal. If you do not eat prior to 3 hour fast-Hold all diabetes (oral and insulin) medications. Patients who wear a continuous glucose monitor MUST remove the device prior to scanning.  You may take your remaining medications with water .  NO perfume, cologne or lotion on chest or abdomen area. FEMALES - Please avoid wearing dresses to this appointment.  Total time is 1 to 2 hours; you may want to bring reading material for the waiting time.  IF YOU THINK YOU MAY BE PREGNANT, OR ARE  NURSING PLEASE INFORM THE TECHNOLOGIST.  In preparation for your appointment, medication and supplies will be purchased.  Appointment availability is limited, so if you need to cancel or reschedule, please call the Radiology Department Scheduler at [phone] 24 hours in advance to avoid a cancellation fee of $100.00  What to Expect When you Arrive:  Once you arrive and check in for your appointment, you will be taken to a preparation room within the Radiology Department.  A technologist or Nurse will obtain your medical history, verify that you are correctly prepped for the exam, and explain the procedure.  Afterwards, an IV will be started in your arm and electrodes will be placed on your skin for EKG monitoring during the stress portion of the exam. Then you will be escorted to the PET/CT scanner.  There, staff will get you positioned on the scanner and obtain a blood pressure and EKG.  During the exam, you will continue to be connected to the EKG and blood pressure machines.  A small, safe amount of a radioactive tracer will be injected in your IV to obtain a series of pictures of your heart along with an injection of a stress agent.    After your Exam:  It is recommended that you eat a meal and drink a caffeinated beverage to counter act any effects of the stress agent.  Drink plenty of fluids for the remainder of the day and urinate frequently for the first couple of hours after the exam.  Your doctor will inform you of your test results within 7-10 business days.  For more information and frequently asked questions, please visit our website: [email or web address]  For questions about your test or how to prepare for your test, please call: Cardiac Imaging Nurse Navigators Office: [phone]    Your physician has requested that you have an echocardiogram. Echocardiography is a painless test that uses sound waves to create images of your heart. It provides your doctor with  information about the size and shape of your heart and how well your heart's chambers and valves are working. This procedure takes approximately one hour. There are no restrictions for this procedure. Please do NOT wear cologne, perfume, aftershave, or lotions (deodorant is allowed). Please arrive 15 minutes prior to your appointment time.  Please note: We ask at that you not bring children with you during ultrasound (echo/ vascular) testing. Due to room size and safety concerns, children are not allowed in the ultrasound rooms during exams. Our front office staff cannot provide observation of children in our lobby area while testing is being conducted. An adult accompanying a patient to their appointment will only be allowed in the ultrasound room at the discretion of the ultrasound technician under special circumstances. We apologize for any inconvenience.   Follow-Up: Your physician recommends that you schedule a follow-up appointment in: Pending Results  Any Other Special Instructions Will Be Listed Below (If Applicable). Thank you for choosing [facility]!     If you need a refill on your cardiac medications before your next appointment, please call your pharmacy.

## 2023-09-23 ENCOUNTER — Other Ambulatory Visit: Payer: Self-pay

## 2023-09-23 ENCOUNTER — Encounter: Payer: Self-pay | Admitting: Internal Medicine

## 2023-09-23 MED ORDER — NITROGLYCERIN 0.4 MG SL SUBL: 0.4000 mg | SUBLINGUAL_TABLET | SUBLINGUAL | 0 refills | Status: DC | PRN

## 2023-09-23 NOTE — Telephone Encounter (Signed)
 One month supply sent to [facility]

## 2023-09-25 ENCOUNTER — Encounter (INDEPENDENT_AMBULATORY_CARE_PROVIDER_SITE_OTHER): Payer: Self-pay

## 2023-09-25 ENCOUNTER — Other Ambulatory Visit: Payer: Self-pay

## 2023-09-25 NOTE — Progress Notes (Signed)
 Specialty Pharmacy Refill Coordination Note  [name] is a 58 y.o. female contacted today regarding refills of specialty medication(s) Benralizumab  (Fasenra  Pen)   Patient requested Delivery   Delivery date: 10/01/23   Verified address: [address], [place], [place], [address]   Medication will be filled on 09/30/22.

## 2023-09-26 ENCOUNTER — Other Ambulatory Visit (HOSPITAL_COMMUNITY): Payer: Self-pay

## 2023-09-26 NOTE — Progress Notes (Signed)
 Clinical Intervention Note  Clinical Intervention Notes: Patient reports starting Vraylar, no DDIs were identified.   Clinical Intervention Outcomes: Prevention of an adverse drug event   Celinda Collar

## 2023-09-30 ENCOUNTER — Other Ambulatory Visit: Payer: Self-pay

## 2023-10-01 ENCOUNTER — Other Ambulatory Visit: Payer: Self-pay | Admitting: "Endocrinology

## 2023-10-01 ENCOUNTER — Other Ambulatory Visit: Payer: Self-pay | Admitting: Acute Care

## 2023-10-01 ENCOUNTER — Telehealth: Payer: Self-pay | Admitting: "Endocrinology

## 2023-10-01 ENCOUNTER — Ambulatory Visit: Payer: Self-pay

## 2023-10-01 DIAGNOSIS — E039 Hypothyroidism, unspecified: Secondary | ICD-10-CM

## 2023-10-01 DIAGNOSIS — Z87891 Personal history of nicotine dependence: Secondary | ICD-10-CM

## 2023-10-01 DIAGNOSIS — Z122 Encounter for screening for malignant neoplasm of respiratory organs: Secondary | ICD-10-CM

## 2023-10-01 LAB — T4, FREE: Free T4: 3.96 ng/dL — ABNORMAL HIGH (ref 0.82–1.77)

## 2023-10-01 LAB — TSH: TSH: 0.086 u[IU]/mL — ABNORMAL LOW (ref 0.450–4.500)

## 2023-10-01 LAB — T3, FREE: T3, Free: 8.3 pg/mL — ABNORMAL HIGH (ref 2.0–4.4)

## 2023-10-01 NOTE — Telephone Encounter (Signed)
-----   Message from Ranny Earl sent at 10/01/2023  8:22 AM EDT ----- Zada, I tried to reach her but she did not pick up. Her labs are showing complete reversal and now excessive thyroid  hormone. I want her to skip all thyroid  hormone for next 3 days and labs in 4 days. ----- Message ----- From: Claudene Zada POUR, LPN Sent: 4/79/7974  11:31 AM EDT To: Ethelle LELON Earl, MD

## 2023-10-01 NOTE — Telephone Encounter (Signed)
 Made aware Dr.Nida was able to contact pt and discussed her results and recommendations with her.

## 2023-10-01 NOTE — Telephone Encounter (Signed)
 Pt has an appt 10/08/23.  I see new lab orders were printed, does she need to keep the appt on 10/08/23 and do I need to mail her the new orders?

## 2023-10-05 LAB — COMPREHENSIVE METABOLIC PANEL WITH GFR
ALT: 19 IU/L (ref 0–32)
AST: 17 IU/L (ref 0–40)
Albumin: 4.2 g/dL (ref 3.8–4.9)
Alkaline Phosphatase: 57 IU/L (ref 44–121)
BUN/Creatinine Ratio: 16 (ref 9–23)
BUN: 13 mg/dL (ref 6–24)
Bilirubin Total: 0.5 mg/dL (ref 0.0–1.2)
CO2: 25 mmol/L (ref 20–29)
Calcium: 9.9 mg/dL (ref 8.7–10.2)
Chloride: 103 mmol/L (ref 96–106)
Creatinine, Ser: 0.83 mg/dL (ref 0.57–1.00)
Globulin, Total: 2 g/dL (ref 1.5–4.5)
Glucose: 90 mg/dL (ref 70–99)
Potassium: 4.1 mmol/L (ref 3.5–5.2)
Sodium: 143 mmol/L (ref 134–144)
Total Protein: 6.2 g/dL (ref 6.0–8.5)
eGFR: 81 mL/min/{1.73_m2} (ref >59)

## 2023-10-05 LAB — TSH: TSH: 0.058 u[IU]/mL — ABNORMAL LOW (ref 0.450–4.500)

## 2023-10-05 LAB — T4, FREE: Free T4: 2.5 ng/dL — ABNORMAL HIGH (ref 0.82–1.77)

## 2023-10-05 LAB — T3, FREE: T3, Free: 4.6 pg/mL — ABNORMAL HIGH (ref 2.0–4.4)

## 2023-10-07 ENCOUNTER — Encounter (HOSPITAL_COMMUNITY): Payer: Self-pay

## 2023-10-07 ENCOUNTER — Other Ambulatory Visit

## 2023-10-08 ENCOUNTER — Encounter: Payer: Self-pay | Admitting: "Endocrinology

## 2023-10-08 ENCOUNTER — Ambulatory Visit: Attending: Internal Medicine

## 2023-10-08 ENCOUNTER — Ambulatory Visit (INDEPENDENT_AMBULATORY_CARE_PROVIDER_SITE_OTHER): Admitting: "Endocrinology

## 2023-10-08 VITALS — BP 98/64 | HR 88 | Ht 64.0 in | Wt 138.4 lb

## 2023-10-08 DIAGNOSIS — R079 Chest pain, unspecified: Secondary | ICD-10-CM

## 2023-10-08 DIAGNOSIS — E2740 Unspecified adrenocortical insufficiency: Secondary | ICD-10-CM | POA: Diagnosis not present

## 2023-10-08 DIAGNOSIS — R7303 Prediabetes: Secondary | ICD-10-CM | POA: Diagnosis not present

## 2023-10-08 DIAGNOSIS — E782 Mixed hyperlipidemia: Secondary | ICD-10-CM | POA: Diagnosis not present

## 2023-10-08 DIAGNOSIS — T380X5A Adverse effect of glucocorticoids and synthetic analogues, initial encounter: Secondary | ICD-10-CM

## 2023-10-08 DIAGNOSIS — E039 Hypothyroidism, unspecified: Secondary | ICD-10-CM | POA: Diagnosis not present

## 2023-10-08 LAB — ECHOCARDIOGRAM COMPLETE
AR max vel: 2.08 cm2
AV Area VTI: 2.44 cm2
AV Area mean vel: 2.52 cm2
AV Mean grad: 5 mmHg
AV Peak grad: 11.7 mmHg
Ao pk vel: 1.71 m/s
Area-P 1/2: 3.1 cm2
Calc EF: 57.5 %
MV VTI: 2.96 cm2
S' Lateral: 2.9 cm
Single Plane A2C EF: 56.7 %
Single Plane A4C EF: 63.8 %

## 2023-10-08 MED ORDER — TIROSINT 100 MCG PO CAPS: 100.0000 ug | ORAL_CAPSULE | Freq: Every day | ORAL | 1 refills | Status: DC

## 2023-10-08 NOTE — Progress Notes (Signed)
 10/08/2023, 2:19 PM   Endocrinology follow-up note  Subjective:    Patient ID: [name], female    DOB: Apr 12, 1965, PCP [name], MD   Past Medical History:  Diagnosis Date   Anemia    Anxiety    Arthritis    Asthma    Cervical radiculopathy    Cluster headaches    COPD (chronic obstructive pulmonary disease) (HCC)    COPD (chronic obstructive pulmonary disease) with chronic bronchitis (HCC)    GERD (gastroesophageal reflux disease)    Hyperlipemia    Hypoglycemia    Hypothyroidism    Migraine    Pneumothorax    PONV (postoperative nausea and vomiting)    Pre-diabetes    Rectal discharge 07/28/2010   Shortness of breath    Sleep apnea    cannot tolerate-not using CPAP   Sluggishness 07/28/2010   Past Surgical History:  Procedure Laterality Date   ABDOMINAL HYSTERECTOMY     with bladder suspension   CERVICAL FUSION     CHOLECYSTECTOMY     COLONOSCOPY N/A 07/30/2013   Procedure: COLONOSCOPY;  Surgeon: Claudis RAYMOND Rivet, MD;  Location: [facility];  Service: Endoscopy;  Laterality: N/A;  930   EXAM UNDER ANESTHESIA WITH MANIPULATION OF SHOULDER Right 10/11/2016   Procedure: EXAM UNDER ANESTHESIA WITH MANIPULATION OF SHOULDER;  Surgeon: Margrette Taft BRAVO, MD;  Location: [facility];  Service: Orthopedics;  Laterality: Right;   HERNIA REPAIR     INCISIONAL HERNIA REPAIR N/A 07/30/2016   Procedure: HERNIA REPAIR INCISIONAL WITH MESH;  Surgeon: Oneil Budge, MD;  Location: [facility];  Service: General;  Laterality: N/A;   INTERSTIM IMPLANT PLACEMENT  02/02/2021   Procedure: RENNA IMPLANT FIRST STAGE LEFT SACRUM ;  Surgeon: Sherrilee Belvie CROME, MD;  Location: [facility];  Service: Urology;;   RENNA IMPLANT PLACEMENT N/A 02/23/2021   Procedure: RENNA IMPLANT SECOND STAGE;  Surgeon: Sherrilee Belvie CROME, MD;  Location: [facility];  Service: Urology;  Laterality: N/A;  Rep coming, time needs to stay at 2:00    INTERSTIM IMPLANT REVISION N/A 10/25/2022   Procedure: REVISION OF RENNA;  Surgeon: Sherrilee Belvie CROME, MD;  Location: [facility];  Service: Urology;  Laterality: N/A;   JOINT REPLACEMENT     LOBECTOMY     right lung    POSTERIOR CERVICAL LAMINECTOMY Right 04/05/2017   Procedure: Right C6-7 Posterior cervical laminectomy;  Surgeon: Onetha Kuba, MD;  Location: [facility] OR;  Service: Neurosurgery;  Laterality: Right;  Right C6-7 Posterior cervical laminectomy   SHOULDER ARTHROSCOPY WITH ROTATOR CUFF REPAIR Right 10/11/2016   Procedure: SHOULDER ARTHROSCOPY;  Surgeon: Margrette Taft BRAVO, MD;  Location: [facility];  Service: Orthopedics;  Laterality: Right;   TOTAL HIP ARTHROPLASTY Right 04/15/2020   Procedure: RIGHT TOTAL HIP ARTHROPLASTY ANTERIOR APPROACH;  Surgeon: Vernetta Lonni GRADE, MD;  Location: [facility];  Service: Orthopedics;  Laterality: Right;   TOTAL HIP ARTHROPLASTY Left 07/22/2020   Procedure: LEFT  HIP ARTHROPLASTY ANTERIOR APPROACH;  Surgeon: Vernetta Lonni GRADE, MD;  Location: [facility];  Service: Orthopedics;  Laterality: Left;  RNFA   TUBAL LIGATION     Social History   Socioeconomic History   Marital status: Married  Spouse name: Not on file   Number of children: Not on file   Years of education: Not on file   Highest education level: Not on file  Occupational History   Not on file  Tobacco Use   Smoking status: Former    Current packs/day: 0.00    Average packs/day: 1 pack/day for 30.0 years (30.0 ttl pk-yrs)    Types: Cigarettes    Start date: 02/20/1978    Quit date: 02/21/2008    Years since quitting: 15.6   Smokeless tobacco: Never  Vaping Use   Vaping status: Never Used  Substance and Sexual Activity   Alcohol use: No    Alcohol/week: 0.0 standard drinks of alcohol    Comment: no   Drug use: No   Sexual activity: Not Currently    Partners: Male    Birth control/protection: Surgical  Other Topics Concern   Not on file  Social History Narrative   Are you  right handed or left handed? Right handed   Are you currently employed ?    What is your current occupation?   Do you live at home alone? NO    Who lives with you? Son   What type of home do you live in: 1 story or 2 story? 1       Social Drivers of Corporate investment banker Strain: Not on file  Food Insecurity: Not on file  Transportation Needs: Not on file  Physical Activity: Not on file  Stress: Not on file  Social Connections: Not on file   Family History  Problem Relation Age of Onset   Neuropathy Mother    Migraines Mother    Cancer Mother    COPD Mother    Diabetes Mother    Hyperlipidemia Mother    Hypertension Mother    Hyperlipidemia Father    Hypertension Father    Migraines Sister    Migraines Maternal Aunt    Stroke Maternal Grandmother    Heart failure Maternal Grandmother    Hypertension Maternal Grandmother    Thyroid  disease Maternal Grandmother    Diabetes Paternal Grandmother    Alzheimer's disease Paternal Grandmother    Stroke Paternal Grandfather    Heart failure Paternal Grandfather    Hypertension Paternal Grandfather    Bipolar disorder Son    Colon cancer Neg Hx    Outpatient Encounter Medications as of 10/08/2023  Medication Sig   Cariprazine HCl (VRAYLAR PO) Take 1 capsule by mouth daily.   TIROSINT  100 MCG CAPS Take 1 capsule (100 mcg total) by mouth daily before breakfast.   acetaminophen  (TYLENOL ) 500 MG tablet Take 1,000 mg by mouth every 6 (six) hours as needed for moderate pain.   albuterol  (PROVENTIL ) (2.5 MG/3ML) 0.083% nebulizer solution USE 1 VIAL IN NEBULIZER EVERY 6 HOURS AS NEEDED FOR WHEEZING OR SHORTNESS OF BREATH   atorvastatin (LIPITOR) 10 MG tablet Take 1 tablet by mouth daily.   benralizumab  (FASENRA  PEN) 30 MG/ML prefilled autoinjector Inject 1 mL (30 mg total) into the skin every 8 (eight) weeks.   Budeson-Glycopyrrol-Formoterol  (BREZTRI  AEROSPHERE) 160-9-4.8 MCG/ACT AERO Inhale 2 puffs into the lungs in the morning  and at bedtime.   cetirizine (ZYRTEC) 10 MG tablet Take 10 mg by mouth daily.   Cholecalciferol (VITAMIN D3 SUPER STRENGTH PO) Take 5,000 Units by mouth daily.   citalopram  (CELEXA ) 20 MG tablet Take 20 mg by mouth daily.   famotidine  (PEPCID ) 20 MG tablet One an hour before bed  FEROSUL 325 (65 Fe) MG tablet Take 325 mg by mouth daily.   fludrocortisone (FLORINEF) 0.1 MG tablet Take 0.2 mg by mouth daily.   Lancets (ONETOUCH DELICA PLUS LANCET33G) MISC Apply topically.   latanoprost (XALATAN) 0.005 % ophthalmic solution Place 1 drop into both eyes at bedtime.   montelukast  (SINGULAIR ) 10 MG tablet Take 1 tablet (10 mg total) by mouth at bedtime.   Multiple Vitamin (MULTIVITAMIN WITH MINERALS) TABS tablet Take 1 tablet by mouth daily.   nitroGLYCERIN  (NITROSTAT ) 0.4 MG SL tablet Place 1 tablet (0.4 mg total) under the tongue every 5 (five) minutes as needed for chest pain (if no relief after 3rd dose proceed to ER or call 911.).   pantoprazole  (PROTONIX ) 40 MG tablet Take 30- 60 min before your first and last meals of the day   predniSONE  (DELTASONE ) 5 MG tablet Take 2 tablets (10 mg total) by mouth daily with breakfast. (Patient taking differently: Take by mouth daily with breakfast.)   pregabalin  (LYRICA ) 75 MG capsule Take 75 mg by mouth 2 (two) times daily.   Spacer/Aero-Holding Chambers DEVI 1 each by Does not apply route daily as needed.   SUMAtriptan  6 MG/0.5ML SOAJ Inject 6 mg as directed daily as needed (migraine).    Triamcinolone  Acetonide (NASACORT  ALLERGY  24HR NA) Place 2 sprays into the nose daily as needed (allergies).   valACYclovir  (VALTREX ) 500 MG tablet Take 500-2,000 mg by mouth See admin instructions. Take 2000mg s at first sign of fever blister outbreak then take 500mg s daily until gone   XOPENEX  HFA 45 MCG/ACT inhaler Inhale 2 puffs into the lungs every 6 (six) hours as needed for wheezing.   [DISCONTINUED] levothyroxine  (SYNTHROID ) 50 MCG tablet Take 50 mcg by mouth daily  at 6 (six) AM. Takes with a 200 mg daily (Patient not taking: Reported on 10/08/2023)   [DISCONTINUED] Levothyroxine  Sodium (TIROSINT ) 200 MCG CAPS Take 200 mcg by mouth daily before breakfast. (Patient not taking: Reported on 10/08/2023)   [DISCONTINUED] liothyronine  (CYTOMEL ) 5 MCG tablet TAKE 2 TABLETS BY MOUTH TWICE DAILY AT  8  AM  AND  10  PM. (Patient not taking: Reported on 10/08/2023)   No facility-administered encounter medications on file as of 10/08/2023.   ALLERGIES: Allergies  Allergen Reactions   Aspirin  Shortness Of Breath    Causes asthma flares    Penicillins Other (See Comments)    UNSPECIFIED REACTION OF CHILDHOOD  Has patient had a PCN reaction causing immediate rash, facial/tongue/throat swelling, SOB or lightheadedness with hypotension: NO Has patient had a PCN reaction causing severe rash involving mucus membranes or skin necrosis: NO Has patient had a PCN reaction that required hospitalization: no Has patient had a PCN reaction occurring within the last 10 years: NO If all of the above answers are NO, then may proceed with Cephalosporin use.    Vicodin [Hydrocodone -Acetaminophen ] Other (See Comments)    Headaches     VACCINATION STATUS: Immunization History  Administered Date(s) Administered   Influenza,inj,Quad PF,6+ Mos 04/13/2021   Influenza,inj,Quad PF,6-35 Mos 01/28/2020   Influenza,inj,quad, With Preservative 02/07/2022   Influenza-Unspecified 02/15/2017, 04/13/2019, 04/13/2021   PFIZER(Purple Top)SARS-COV-2 Vaccination 06/21/2019, 07/22/2019, 03/09/2020   PNEUMOCOCCAL CONJUGATE-20 04/13/2021    HPI [name] is 58 y.o. female who presents today with a medical history as above. she is being seen in follow-up after she was seen in consultation for steroid induced adrenal suppression.  She also has hypothyroidism due to Hashimoto's thyroiditis.  With presentations consistent with refractory hypothyroidism which  required progressively increasing  thyroid  hormone doses involving Tirosint  200 mcg daily, levothyroxine  50 mg daily and Cytomel  10 mg p.o. twice daily; patient's most recent labs showed over replacement.  She was advised to hold all of her thyroid  hormone supplements (Free T4 3.96) for few days and subsequent labs show improved profile (Free T4 2.5 still high on October 04, 2023)-see below .  She does not have acute complaints today.  She does not report palpitations, tremors, heat intolerance.  For her adrenal insufficiency, she was on ongoing prednisone , currently 5 mg and fludrocortisone 0.2 mg p.o. daily.  She denies dizziness, her blood pressure today is 96/64 mmHg  See notes from her previous visit.  She has been treated with prednisone  more or less continuously for the last 15 years.   She denies any history of adrenal crisis.   She denies diarrhea nor vomiting.  She was offered surveillance CT abdomen/pelvis to rule out consumptive hypothyroidism which returns negative findings. -  She reports fatigue and cold intolerance.  She relates consistency with taking her medications.       She has history of heavy smoking, complicated by COPD on ongoing inhalers including albuterol , budesonide , and Perforomist .  She is also on oxygen  supplement 24/7. She denies any history of adrenal injury, penetrating abdominal trauma.  -  She also has hyperlipidemia and prediabetes.  - Her most recent bone density from March 25, 2019 was consistent with osteopenia of the spine, normal bone density on the left forearm radius.    Review of Systems  Constitutional: + Steady weight,    + fatigue, no subjective hyperthermia, no subjective hypothermia Eyes: no blurry vision, no xerophthalmia  Respiratory: + On continuous oxygen  supplement.   Status post partial pneumonectomy in 2004.   Objective:       10/08/2023    1:03 PM 09/17/2023    1:31 PM 08/27/2023   11:15 AM  Vitals with BMI  Height 5' 4 5' 4 5' 4  Weight 138 lbs 6 oz 141  lbs 13 oz 140 lbs 13 oz  BMI 23.74 24.33 24.16  Systolic 98 114 96  Diastolic 64 62 64  Pulse 88 87 80    BP 98/64   Pulse 88   Ht 5' 4 (1.626 m)   Wt 138 lb 6.4 oz (62.8 kg)   BMI 23.76 kg/m   Wt Readings from Last 3 Encounters:  10/08/23 138 lb 6.4 oz (62.8 kg)  09/17/23 141 lb 12.8 oz (64.3 kg)  08/27/23 140 lb 12.8 oz (63.9 kg)    Physical Exam  Constitutional:  Body mass index is 23.76 kg/m.,  not in acute distress, normal state of mind    CMP ( most recent) CMP     Component Value Date/Time   NA 143 10/04/2023 0852   K 4.1 10/04/2023 0852   CL 103 10/04/2023 0852   CO2 25 10/04/2023 0852   GLUCOSE 90 10/04/2023 0852   GLUCOSE 104 (H) 10/30/2021 0958   BUN 13 10/04/2023 0852   CREATININE 0.83 10/04/2023 0852   CALCIUM  9.9 10/04/2023 0852   CALCIUM  9.3 05/16/2023 0000   PROT 6.2 10/04/2023 0852   ALBUMIN 4.2 10/04/2023 0852   AST 17 10/04/2023 0852   ALT 19 10/04/2023 0852   ALKPHOS 57 10/04/2023 0852   BILITOT 0.5 10/04/2023 0852   GFRNONAA >60 10/30/2021 0958   GFRAA >60 03/06/2019 0931     Diabetic Labs (most recent): Lab Results  Component Value Date  HGBA1C 5.6 08/27/2023   HGBA1C 5.7 02/08/2023   HGBA1C 5.6 01/24/2023     Lab Results  Component Value Date   TSH 0.058 (L) 10/04/2023   TSH 0.086 (L) 09/30/2023   TSH 79.800 (H) 08/20/2023   TSH 97.700 (H) 06/13/2023   TSH 126.00 (A) 05/16/2023   TSH 95.700 (H) 04/17/2023   TSH 117.000 (H) 04/01/2023   TSH 121.000 (H) 02/14/2023   TSH 127.000 (H) 01/18/2023   TSH 123.000 (H) 11/27/2022   FREET4 2.50 (H) 10/04/2023   FREET4 3.96 (H) 09/30/2023   FREET4 <0.10 (L) 08/20/2023   FREET4 <0.10 (L) 06/13/2023   FREET4 0.10 05/16/2023   FREET4 0.26 (L) 04/17/2023   FREET4 0.15 (L) 04/01/2023   FREET4 0.10 (L) 02/14/2023   FREET4 0.10 (L) 01/18/2023   FREET4 0.10 (L) 11/27/2022     Lipid Panel     Component Value Date/Time   CHOL 163 08/20/2023 0806   TRIG 77 08/20/2023 0806   HDL  81 08/20/2023 0806   CHOLHDL 2.0 08/20/2023 0806   LDLCALC 68 08/20/2023 0806   LABVLDL 14 08/20/2023 0806    CT of abdomen and pelvis without contrast on March 06, 2023 IMPRESSION: 1. No findings to explain the patient's clinical history. 2. Trace pericardial fluid. 3. Punctate right renal stone. 4.  Aortic atherosclerosis (ICD10-I70.0).   Thyroid  ultrasound on April 25, 2023 No discrete nodules are seen within the thyroid  gland.   IMPRESSION: Markedly heterogeneous and small/atrophied thyroid  gland consistent with the clinical history of hypothyroidism and presumed long-standing exogenous thyroid  hormone replacement therapy.  No thyroid  nodules.  Assessment & Plan:   1.  Hypothyroidism -due to Hashimoto's thyroiditis 2.  Steroid induced adrenal suppression  3.  Prediabetes 4.  Hyperlipidemia   - I have reviewed her recent and available endocrine records and clinically evaluated the patient.  Reviewing her recent labs indicate that the cause of her hypothyroidism is Hashimoto's thyroiditis.  She presents with signs of overtreatment after a course of refractory hypothyroidism which required superhigh dose of Tirosint , levothyroxine  and Cytomel .    For safety reasons, she will be resumed with a lower dose of Tirosint .  I discussed and resume Tirosint  100 mcg p.o. daily before breakfast.   - We discussed about the correct intake of her thyroid  hormone, on empty stomach at fasting, with water , separated by at least 30 minutes from breakfast and other medications,  and separated by more than 4 hours from calcium , iron, multivitamins, acid reflux medications (PPIs). -Patient is made aware of the fact that thyroid  hormone replacement is needed for life, dose to be adjusted by periodic monitoring of thyroid  function tests.  -Consumptive hypothyroidism seems to be unlikely with negative CT abdomen/pelvis.  -Her surveillance thyroid /neck ultrasound was unremarkable.   - she  has adrenal suppression due to chronic steroid treatment related to her COPD/asthma.  This patient will likely need lifelong replacement dose steroids, glucocorticoids for now.  She is advised to continue prednisone  , lowest effective dose, currently at 5 mg p.o. daily at breakfast.  Her point-of-care A1c is 5.6% indicating absence of prediabetes/diabetes.     -Her blood pressure is stable at 98/64.  She is advised to continue Florinef 0.2 mg p.o. daily at breakfast.    It is unsafe, unnecessary to withdraw prednisone  to test her for adrenal insufficiency.  -She wears a medical alert for  depicting her adrenal insufficiency and the need for steroids.    Regarding her dyslipidemia: She presents with significant  improvement in her lipid panel with LDL at 68 improving from 155.    She is advised to continue atorvastatin 10 mg p.o. nightly.  Side effects and precautions discussed with her.     - she is advised to maintain close follow up with [name], MD for primary care needs.   I spent  25  minutes in the care of the patient today including review of labs from Thyroid  Function, CMP, and other relevant labs ; imaging/biopsy records (current and previous including abstractions from other facilities); face-to-face time discussing  her lab results and symptoms, medications doses, her options of short and long term treatment based on the latest standards of care / guidelines;   and documenting the encounter.  Joaquin LELON Drones  participated in the discussions, expressed understanding, and voiced agreement with the above plans.  All questions were answered to her satisfaction. she is encouraged to contact clinic should she have any questions or concerns prior to her return visit.   Follow up plan: Return in about 8 weeks (around 12/03/2023) for F/U with Pre-visit Labs.   Ranny Earl, MD [facility] [address] [place], [place]  [address] Phone: [phone]  Fax: [phone]     10/08/2023, 2:19 PM  This note was partially dictated with voice recognition software. Similar sounding words can be transcribed inadequately or may not  be corrected upon review.

## 2023-10-09 ENCOUNTER — Ambulatory Visit (HOSPITAL_COMMUNITY)
Admission: RE | Admit: 2023-10-09 | Discharge: 2023-10-09 | Disposition: A | Discharge status: Home / Self Care | Source: Ambulatory Visit | Attending: Internal Medicine | Admitting: Internal Medicine

## 2023-10-09 DIAGNOSIS — R079 Chest pain, unspecified: Secondary | ICD-10-CM

## 2023-10-09 LAB — NM PET CT CARDIAC PERFUSION MULTI W/ABSOLUTE BLOODFLOW
MBFR: 3.01
Nuc Stress EF: 69 %
Rest MBF: 0.94 ml/g/min
Rest Nuclear Isotope Dose: 16.1 mCi
ST Depression (mm): 0 mm
Stress MBF: 2.83 ml/g/min
Stress Nuclear Isotope Dose: 16.1 mCi
TID: 1.03

## 2023-10-09 MED ORDER — REGADENOSON 0.4 MG/5ML IV SOLN
INTRAVENOUS | Status: AC
Start: 2023-10-09 — End: 2023-10-09
  Filled 2023-10-09: qty 5

## 2023-10-09 MED ORDER — RUBIDIUM RB82 GENERATOR (RUBYFILL)
16.1600 | PACK | Freq: Once | INTRAVENOUS | Status: AC
  Administered 2023-10-09: 16.16 via INTRAVENOUS

## 2023-10-09 MED ORDER — RUBIDIUM RB82 GENERATOR (RUBYFILL)
16.1400 | PACK | Freq: Once | INTRAVENOUS | Status: AC
  Administered 2023-10-09: 16.14 via INTRAVENOUS

## 2023-10-09 MED ORDER — REGADENOSON 0.4 MG/5ML IV SOLN
0.4000 mg | Freq: Once | INTRAVENOUS | Status: AC
  Administered 2023-10-09: 0.4 mg via INTRAVENOUS

## 2023-10-14 ENCOUNTER — Other Ambulatory Visit: Payer: Self-pay | Admitting: Allergy & Immunology

## 2023-10-14 ENCOUNTER — Encounter: Payer: Self-pay | Admitting: Internal Medicine

## 2023-10-16 ENCOUNTER — Ambulatory Visit (INDEPENDENT_AMBULATORY_CARE_PROVIDER_SITE_OTHER): Payer: Medicare Other | Admitting: Allergy & Immunology

## 2023-10-16 ENCOUNTER — Encounter: Payer: Self-pay | Admitting: Allergy & Immunology

## 2023-10-16 VITALS — BP 104/70 | HR 88 | Temp 97.8°F | Resp 16 | Wt 139.1 lb

## 2023-10-16 DIAGNOSIS — J302 Other seasonal allergic rhinitis: Secondary | ICD-10-CM | POA: Diagnosis not present

## 2023-10-16 DIAGNOSIS — J4489 Other specified chronic obstructive pulmonary disease: Secondary | ICD-10-CM | POA: Diagnosis not present

## 2023-10-16 DIAGNOSIS — J3089 Other allergic rhinitis: Secondary | ICD-10-CM | POA: Diagnosis not present

## 2023-10-16 DIAGNOSIS — D849 Immunodeficiency, unspecified: Secondary | ICD-10-CM

## 2023-10-16 MED ORDER — BREZTRI AEROSPHERE 160-9-4.8 MCG/ACT IN AERO: 2.0000 | INHALATION_SPRAY | Freq: Two times a day (BID) | RESPIRATORY_TRACT | 4 refills | Status: DC

## 2023-10-16 NOTE — Patient Instructions (Addendum)
 1. Asthma-COPD overlap syndrome - Lung testing looks a bit lower today. - We will not make any changes today.  - We can consider adding a nebulizer medication that targets a different pathway (phosphodiesterase inhibitor): OhtuvayreT (ensifentrine ) - We can always add that in the future if needed, if needed. - Daily controller medication(s): Breztri  two puffs TWICE DAILY WITH SPACER + prednisone  10mg   daily + Singulair  10mg  daily + Fasenra  every 8 weeks - Rescue medications: Xopenex  4 puffs every 4-6 hours as needed or Xopenex  nebulizer one vial every 4-6 hours as needed - Asthma control goals:  * Full participation in all desired activities (may need albuterol  before activity) * Albuterol  use two time or less a week on average (not counting use with activity) * Cough interfering with sleep two time or less a month * Oral steroids no more than once a year * No hospitalizations   2. Chronic rhinitis (horse, tobacco leaf, grasses, ragweed, trees, indoor molds, outdoor molds, dust mites, cat and dog) - Continue with: Zyrtec (cetirizine) 10mg  tablet once daily and Singulair  (montelukast ) 10mg  daily and Nasacort  (triamcinolone ) two sprays per nostril daily AS NEEDED - Continue with nasal saline rinses.  3. Return in about 6 months (around 04/17/2024). You can have the follow up appointment with Dr. Iva or a Nurse Practicioner (our Nurse Practitioners are excellent and always have Physician oversight!).    Please inform us  of any Emergency Department visits, hospitalizations, or changes in symptoms. Call us  before going to the ED for breathing or allergy  symptoms since we might be able to fit you in for a sick visit. Feel free to contact us  anytime with any questions, problems, or concerns.  It was a pleasure to see you again today!  Websites that have reliable patient information: 1. American Academy of Asthma, Allergy , and Immunology: www.aaaai.org 2. Food Allergy  Research and Education  (FARE): foodallergy.org 3. Mothers of Asthmatics: http://www.asthmacommunitynetwork.org 4. [employer] of Allergy , Asthma, and Immunology: www.acaai.org      "Like" us  on Facebook and Instagram for our latest updates!      A healthy democracy works best when [employer] participate! Make sure you are registered to vote! If you have moved or changed any of your contact information, you will need to get this updated before voting! Scan the QR codes below to learn more!

## 2023-10-16 NOTE — Progress Notes (Signed)
 FOLLOW UP  Date of Service/Encounter:  10/16/23   Assessment:   Steroid and oxygen  dependent asthma-COPD overlap syndrome - doing well on Fasenra     Multiple complications of chronic prednisone  use, including avascular necrosis    Stage 3 CKD   AAT1 - MM phenotype with level of 141    Weaning steroids (currently on 7.5 mg and 5 mg every other day alternating)   Seasonal and perennial allergic rhinitis (horse, tobacco leaf, grasses, ragweed, trees, indoor molds, outdoor molds, dust mites, cat and dog)   Osteopenia - bone density in 2022    Balance disorder - in Physical Therapy for this    On disability   Plan/Recommendations:   1. Asthma-COPD overlap syndrome - Lung testing looks a bit lower today. - We will not make any changes today.  - We can consider adding a nebulizer medication that targets a different pathway (phosphodiesterase inhibitor): OhtuvayreT (ensifentrine ) - We can always add that in the future if needed, if needed. - Daily controller medication(s): Breztri  two puffs TWICE DAILY WITH SPACER + prednisone  10mg   daily + Singulair  10mg  daily + Fasenra  every 8 weeks - Rescue medications: Xopenex  4 puffs every 4-6 hours as needed or Xopenex  nebulizer one vial every 4-6 hours as needed - Asthma control goals:  * Full participation in all desired activities (may need albuterol  before activity) * Albuterol  use two time or less a week on average (not counting use with activity) * Cough interfering with sleep two time or less a month * Oral steroids no more than once a year * No hospitalizations   2. Chronic rhinitis (horse, tobacco leaf, grasses, ragweed, trees, indoor molds, outdoor molds, dust mites, cat and dog) - Continue with: Zyrtec (cetirizine) 10mg  tablet once daily and Singulair  (montelukast ) 10mg  daily and Nasacort  (triamcinolone ) two sprays per nostril daily AS NEEDED - Continue with nasal saline rinses.  3. Return in about 6 months (around 04/17/2024).  You can have the follow up appointment with Dr. Iva or a Nurse Practicioner (our Nurse Practitioners are excellent and always have Physician oversight!).   Subjective:   [name] is a 58 y.o. female presenting today for follow up of  Chief Complaint  Patient presents with   Follow-up    Some SOB- especially when she gets active.     [name] has a history of the following: Patient Active Problem List   Diagnosis Date Noted   Anemia 07/01/2023   Chest pain of uncertain etiology 07/01/2023   Urinary urgency 11/08/2022   Urge incontinence 11/08/2022   Nocturia 11/08/2022   S/P implantation of urinary electronic stimulator device 11/08/2022   HLD (hyperlipidemia) 10/10/2022   Hardening of the aorta (main artery of the heart) (HCC) 09/10/2022   Easy bruising 07/31/2022   Overactive bladder 06/30/2022   Severe persistent asthma without complication 06/06/2022   Gait abnormality 05/28/2022   Steroid-induced adrenal suppression (HCC) 03/30/2022   Minimal cognitive impairment 03/19/2022   Moderate recurrent major depression (HCC) 10/23/2021   Osteopenia 10/23/2021   Acid reflux 10/23/2021   Endocrine disorder, unspecified 07/04/2021   Prediabetes 07/04/2021   Hypothyroidism 07/04/2021   Former smoker 06/29/2021   Seasonal and perennial allergic rhinitis 06/19/2021   Oxygen  dependent 06/19/2021   Steroid dependent (HCC) 06/19/2021   Immunosuppressed status (HCC) 06/19/2021   Major depressive disorder 02/16/2021   Radiculopathy, lumbar region 12/08/2020   Chronic obstructive pulmonary disease (HCC) 10/19/2020   Anxiety 10/13/2020   Status post left hip  replacement 07/22/2020   Status post right hip replacement 05/02/2020   Status post total replacement of right hip 04/15/2020   Avascular necrosis of bone of left hip (HCC) 02/10/2020   Avascular necrosis of bone of right hip (HCC) 02/10/2020   Allergic rhinitis 05/21/2019   Asthma-COPD overlap  syndrome (HCC) 04/30/2019   Chronic respiratory failure with hypoxia (HCC) 04/30/2019   Spinal stenosis of cervical region 04/05/2017   Partial tear of right subscapularis tendon    Incisional hernia, without obstruction or gangrene    Urinary frequency 03/21/2012   Migraine equivalent syndrome 03/21/2012   Insomnia 03/21/2012    History obtained from: chart review and patient.  Discussed the use of AI scribe software for clinical note transcription with the patient and/or guardian, who gave verbal consent to proceed.  [name] is a 58 y.o. female presenting for a follow up visit.  She was last seen in January 2025.  At that time, lung testing looked a bit lower.  We started her on a prednisone  taper.  We changed her from albuterol  to Xopenex .  We talked about adding another inhaled medicine to help with her, but we decided to hold off on that.  We continue with Breztri  2 puffs twice daily as well as prednisone  5 mg daily, Singulair  10 mg daily, and Fasenra  every 8 weeks.  For her rhinitis, we continue with Zyrtec as well as Singulair  and Nasacort .  Since last visit, she has done fairly well.  Chest pain is described as feeling like 'hot oil boiling in your throat' and is associated with heartburn. Relief is achieved by taking Tums, requiring four or five tablets to ease symptoms. Previous cardiac evaluation, including an echocardiogram and chemical stress test, returned normal results.  Asthma/Respiratory Symptom History: Shortness of breath occurs particularly in hot and humid conditions. They use Xopenex  once daily and Breztri  twice daily, and are also on Fasenra  and prednisone , with the latter increased to 10 mg due to low blood pressure episodes. Breathing is stable, although exertional dyspnea is present. She has remained on the Breztri  2 puffs twice daily.  She sees Dr. Mallipeddi for management of chest pain.  She was last seen in June 2025.  At that time, she was having chest pain at  rest and at night when lying down.  She was on famotidine  and PPI twice daily.  She had previously undergone an echocardiogram, stress test, and event monitor in 2021 that were all unremarkable.  She had a cardiac PET/CT stress test ordered.  These were both normal, pointing towards a noncardiac etiology of her chest pain.  GERD Symptom History: She is going to have a colonoscopy and EGD.  She sees Dr. Eartha, next appointment July 17.  She is very worried about Barrett's is a   They have stage 3 kidney disease and are unable to take Tylenol  or ibuprofen due to kidney concerns. Management includes financial assistance and disability support.  Regular leg and hip exercises are facilitated by a program providing reminders and equipment for home workouts.     She continues to follow with Dr. Lenis for hypothyroidism.  She was biggest and esophageal cancer, but she is trying to remain in her worry.  Last seen in May 2025.  She was continued on Tirosint  200 mcg, levothyroxine  50 mcg p.o. daily along with Cytomel  10 mcg p.o. twice daily.  For her adrenal insufficiency, she was continued on 5 mg prednisone  and fludrocortisone 0.2 mg daily.  Otherwise, there have been  no changes to her past medical history, surgical history, family history, or social history.    Review of systems otherwise negative other than that mentioned in the HPI.    Objective:   Blood pressure 104/70, pulse 88, temperature 97.8 F (36.6 C), resp. rate 16, weight 139 lb 2 oz (63.1 kg), SpO2 97%. Body mass index is 23.88 kg/m.    Physical Exam Vitals reviewed.  Constitutional:      Appearance: She is well-developed.     Comments: Wearing oxygen .  Smiling.  Cooperative with the exam.  HENT:     Head: Normocephalic and atraumatic.     Right Ear: Tympanic membrane, ear canal and external ear normal.     Left Ear: Tympanic membrane, ear canal and external ear normal.     Nose: No nasal deformity, septal deviation,  mucosal edema or rhinorrhea.     Right Turbinates: Enlarged, swollen and pale.     Left Turbinates: Enlarged, swollen and pale.     Right Sinus: No maxillary sinus tenderness or frontal sinus tenderness.     Left Sinus: No maxillary sinus tenderness or frontal sinus tenderness.     Comments: Nares dry from the oxygen  cannula.  No nasal polyps.    Mouth/Throat:     Lips: Pink.     Mouth: Mucous membranes are moist. Mucous membranes are not pale and not dry.     Pharynx: Uvula midline.  Eyes:     General: Lids are normal. Allergic shiner present.        Right eye: No discharge.        Left eye: No discharge.     Conjunctiva/sclera: Conjunctivae normal.     Right eye: Right conjunctiva is not injected. No chemosis.    Left eye: Left conjunctiva is not injected. No chemosis.    Pupils: Pupils are equal, round, and reactive to light.  Cardiovascular:     Rate and Rhythm: Normal rate and regular rhythm.     Heart sounds: Normal heart sounds.  Pulmonary:     Effort: Pulmonary effort is normal. No tachypnea, accessory muscle usage or respiratory distress.     Breath sounds: Normal breath sounds. No wheezing, rhonchi or rales.     Comments: Moving air much better today compared to previous physical exams. Coarse upper airway sounds. Chest:     Chest wall: No tenderness.  Lymphadenopathy:     Cervical: No cervical adenopathy.  Skin:    General: Skin is warm.     Capillary Refill: Capillary refill takes less than 2 seconds.     Coloration: Skin is not pale.     Findings: No abrasion, erythema, petechiae or rash. Rash is not papular, urticarial or vesicular.     Comments: No eczematous lesions noted.   Neurological:     Mental Status: She is alert.  Psychiatric:        Behavior: Behavior is cooperative.      Diagnostic studies:    Spirometry: results abnormal (FEV1: 1.69/68%, FVC: 2.22/10%, FEV1/FVC: 76%).    Spirometry consistent with possible restrictive disease. This is stable  compared to previous spirometry findings.   Allergy  Studies: none       Marty Shaggy, MD  [facility] 

## 2023-10-17 ENCOUNTER — Other Ambulatory Visit: Payer: Self-pay | Admitting: Medical Genetics

## 2023-10-23 ENCOUNTER — Other Ambulatory Visit (HOSPITAL_COMMUNITY)
Admission: RE | Admit: 2023-10-23 | Discharge: 2023-10-23 | Disposition: A | Discharge status: Home / Self Care | Payer: Self-pay | Source: Ambulatory Visit | Attending: Medical Genetics | Admitting: Medical Genetics

## 2023-10-23 ENCOUNTER — Encounter (HOSPITAL_COMMUNITY): Payer: Self-pay

## 2023-10-23 ENCOUNTER — Ambulatory Visit: Payer: Self-pay | Admitting: Internal Medicine

## 2023-10-23 ENCOUNTER — Ambulatory Visit (HOSPITAL_COMMUNITY)
Admission: RE | Admit: 2023-10-23 | Discharge: 2023-10-23 | Disposition: A | Discharge status: Home / Self Care | Payer: Self-pay | Source: Ambulatory Visit | Attending: Internal Medicine | Admitting: Internal Medicine

## 2023-10-23 DIAGNOSIS — Z1231 Encounter for screening mammogram for malignant neoplasm of breast: Secondary | ICD-10-CM | POA: Diagnosis present

## 2023-10-24 ENCOUNTER — Ambulatory Visit (INDEPENDENT_AMBULATORY_CARE_PROVIDER_SITE_OTHER): Admitting: Gastroenterology

## 2023-10-24 ENCOUNTER — Encounter (INDEPENDENT_AMBULATORY_CARE_PROVIDER_SITE_OTHER): Payer: Self-pay | Admitting: Gastroenterology

## 2023-10-24 ENCOUNTER — Other Ambulatory Visit: Payer: Self-pay | Admitting: Allergy & Immunology

## 2023-10-24 VITALS — BP 112/66 | HR 90 | Temp 98.0°F | Ht 64.0 in | Wt 138.7 lb

## 2023-10-24 DIAGNOSIS — D509 Iron deficiency anemia, unspecified: Secondary | ICD-10-CM | POA: Diagnosis not present

## 2023-10-24 DIAGNOSIS — K219 Gastro-esophageal reflux disease without esophagitis: Secondary | ICD-10-CM

## 2023-10-24 NOTE — Patient Instructions (Addendum)
 Schedule EGD and colonoscopy Continue pantoprazole  40 mg twice a day for now and famotidine  at night Depending on findings on EGD, we will start you on Voquezna Continue oral iron supplementation daily

## 2023-10-24 NOTE — H&P (View-Only) (Signed)
 [name], M.D. Gastroenterology & Hepatology [facility] [address] [place], [place] [address]  Primary Care Physician: Melissa Norleen PEDLAR, MD [address] [name] [address]  I will communicate my assessment and recommendations to the referring MD via EMR.  Problems: Mild iron deficiency anemia  History of Present Illness: [name] is a 58 y.o.  female with past medical history of COPD on 3L/min, Hashimoto's thyroiditis, adrenal insufficiency, GERD, hyperlipidemia, sleep apnea, prediabetes,  who presents for follow up of anemia.  The patient was last seen on 07/01/2023. At that time, the patient was continued on oral iron and pantoprazole  40 mg twice daily and famotidine  at night.  Patient has been seen by Dr. Lenis who has been managing her thyroid  function with thyroid  supplementation.  Most recent labs from 2 weeks ago showed a TSH of 0.05 still suppressed with mildly elevated free T4 of 2.50 and free T3 of 4.6.  She has been cleared by Dr. Lenis.  Patient has been evaluated by cardiology for chest pain.  Had a nuclear PET/CT cardiac scan that was normal.  It was considered her chest pain was noncardiac in nature.  Review of most recent blood workup from 05/16/2023 showed a hemoglobin of 10.9, WBC 8.2, platelets 282, CMP with creatinine 1.41, BUN 13, sodium 145, potassium 3.9, albumin 4.6, total bilirubin is less than 0.2, AST 23, ALT 17, alkaline phosphatase 54.  Patient had iron stores checked on 03/14/2023 which showed normal TIBC 394, borderline iron saturation of 15%, iron of 59, negative hepatitis B surface antigen.   Patient states she has noticed some occasional episodes of melena. The patient denies having any nausea, vomiting, fever, chills, hematochezia, hematemesis, abdominal distention, abdominal pain, diarrhea, jaundice, pruritus or weight loss. She is currently ferosul 325 mg once a day, has taken this for last  6 months.  She has had issues with constipation, and has a BM every 4-5 days. May take Colace as needed, but may need some Dulcolax.  Patient reports that she is taking pantoprazole  40 mg twice a day and famotidine , with Tums, and this does not control her symptoms. She has heartburn every day.  Most recent labs from 10/04/2023 showed AST 17, ALT 19, alkaline phosphatase 57, albumin 4.2, potassium 4.1, sodium 143, creatinine 0.83, BUN 13.  Last EGD:>20 years ago, no report available   Last Colonoscopy:07/30/2013 Prep satisfactory. Normal mucosa of cecum, ascending colon, hepatic flexure, transverse colon, splenic flexure, descending and sigmoid colon. Normal rectal mucosa. hemorrhoids below the dentate line.  Past Medical History: Past Medical History:  Diagnosis Date   Anemia    Anxiety    Arthritis    Asthma    Cervical radiculopathy    Cluster headaches    COPD (chronic obstructive pulmonary disease) (HCC)    COPD (chronic obstructive pulmonary disease) with chronic bronchitis (HCC)    GERD (gastroesophageal reflux disease)    Hyperlipemia    Hypoglycemia    Hypothyroidism    Migraine    Pneumothorax    PONV (postoperative nausea and vomiting)    Pre-diabetes    Rectal discharge 07/28/2010   Shortness of breath    Sleep apnea    cannot tolerate-not using CPAP   Sluggishness 07/28/2010    Past Surgical History: Past Surgical History:  Procedure Laterality Date   ABDOMINAL HYSTERECTOMY     with bladder suspension   CERVICAL FUSION     CHOLECYSTECTOMY     COLONOSCOPY  N/A 07/30/2013   Procedure: COLONOSCOPY;  Surgeon: Melissa RAYMOND Rivet, MD;  Location: [facility];  Service: Endoscopy;  Laterality: N/A;  930   EXAM UNDER ANESTHESIA WITH MANIPULATION OF SHOULDER Right 10/11/2016   Procedure: EXAM UNDER ANESTHESIA WITH MANIPULATION OF SHOULDER;  Surgeon: Melissa Taft BRAVO, MD;  Location: [facility];  Service: Orthopedics;  Laterality: Right;   HERNIA REPAIR      INCISIONAL HERNIA REPAIR N/A 07/30/2016   Procedure: HERNIA REPAIR INCISIONAL WITH MESH;  Surgeon: Melissa Budge, MD;  Location: [facility];  Service: General;  Laterality: N/A;   INTERSTIM IMPLANT PLACEMENT  02/02/2021   Procedure: RENNA IMPLANT FIRST STAGE LEFT SACRUM ;  Surgeon: Melissa Belvie CROME, MD;  Location: [facility];  Service: Urology;;   RENNA IMPLANT PLACEMENT N/A 02/23/2021   Procedure: RENNA IMPLANT SECOND STAGE;  Surgeon: Melissa Belvie CROME, MD;  Location: [facility];  Service: Urology;  Laterality: N/A;  Rep coming, time needs to stay at 2:00   INTERSTIM IMPLANT REVISION N/A 10/25/2022   Procedure: REVISION OF RENNA;  Surgeon: Melissa Belvie CROME, MD;  Location: [facility];  Service: Urology;  Laterality: N/A;   JOINT REPLACEMENT     LOBECTOMY     right lung    POSTERIOR CERVICAL LAMINECTOMY Right 04/05/2017   Procedure: Right C6-7 Posterior cervical laminectomy;  Surgeon: Melissa Kuba, MD;  Location: [facility] OR;  Service: Neurosurgery;  Laterality: Right;  Right C6-7 Posterior cervical laminectomy   SHOULDER ARTHROSCOPY WITH ROTATOR CUFF REPAIR Right 10/11/2016   Procedure: SHOULDER ARTHROSCOPY;  Surgeon: Melissa Taft BRAVO, MD;  Location: [facility];  Service: Orthopedics;  Laterality: Right;   TOTAL HIP ARTHROPLASTY Right 04/15/2020   Procedure: RIGHT TOTAL HIP ARTHROPLASTY ANTERIOR APPROACH;  Surgeon: Melissa Lonni GRADE, MD;  Location: [facility];  Service: Orthopedics;  Laterality: Right;   TOTAL HIP ARTHROPLASTY Left 07/22/2020   Procedure: LEFT  HIP ARTHROPLASTY ANTERIOR APPROACH;  Surgeon: Melissa Lonni GRADE, MD;  Location: [facility];  Service: Orthopedics;  Laterality: Left;  RNFA   TUBAL LIGATION      Family History: Family History  Problem Relation Age of Onset   Neuropathy Mother    Migraines Mother    Cancer Mother    COPD Mother    Diabetes Mother    Hyperlipidemia Mother    Hypertension Mother    Hyperlipidemia Father    Hypertension Father    Migraines  Sister    Migraines Maternal Aunt    Stroke Maternal Grandmother    Heart failure Maternal Grandmother    Hypertension Maternal Grandmother    Thyroid  disease Maternal Grandmother    Diabetes Paternal Grandmother    Alzheimer's disease Paternal Grandmother    Stroke Paternal Grandfather    Heart failure Paternal Grandfather    Hypertension Paternal Grandfather    Bipolar disorder Son    Colon cancer Neg Hx     Social History: Social History   Tobacco Use  Smoking Status Former   Current packs/day: 0.00   Average packs/day: 1 pack/day for 30.0 years (30.0 ttl pk-yrs)   Types: Cigarettes   Start date: 02/20/1978   Quit date: 02/21/2008   Years since quitting: 15.6  Smokeless Tobacco Never   Social History   Substance and Sexual Activity  Alcohol Use No   Alcohol/week: 0.0 standard drinks of alcohol   Comment: no   Social History   Substance and Sexual Activity  Drug Use No    Allergies: Allergies  Allergen Reactions   Aspirin   Shortness Of Breath    Causes asthma flares    Penicillins Other (See Comments)    UNSPECIFIED REACTION OF CHILDHOOD  Has patient had a PCN reaction causing immediate rash, facial/tongue/throat swelling, SOB or lightheadedness with hypotension: NO Has patient had a PCN reaction causing severe rash involving mucus membranes or skin necrosis: NO Has patient had a PCN reaction that required hospitalization: no Has patient had a PCN reaction occurring within the last 10 years: NO If all of the above answers are NO, then may proceed with Cephalosporin use.    Vicodin [Hydrocodone -Acetaminophen ] Other (See Comments)    Headaches     Medications: Current Outpatient Medications  Medication Sig Dispense Refill   albuterol  (PROVENTIL ) (2.5 MG/3ML) 0.083% nebulizer solution USE 1 VIAL IN NEBULIZER EVERY 6 HOURS AS NEEDED FOR WHEEZING OR SHORTNESS OF BREATH 75 mL 0   atorvastatin (LIPITOR) 10 MG tablet Take 1 tablet by mouth daily.      benralizumab  (FASENRA  PEN) 30 MG/ML prefilled autoinjector Inject 1 mL (30 mg total) into the skin every 8 (eight) weeks. 1 mL 6   budesonide -glycopyrrolate -formoterol  (BREZTRI  AEROSPHERE) 160-9-4.8 MCG/ACT AERO inhaler Inhale 2 puffs into the lungs in the morning and at bedtime. 3 each 4   Cariprazine HCl (VRAYLAR PO) Take 1 capsule by mouth daily.     cetirizine (ZYRTEC) 10 MG tablet Take 10 mg by mouth daily.     Cholecalciferol (VITAMIN D3 SUPER STRENGTH PO) Take 5,000 Units by mouth daily.     citalopram  (CELEXA ) 20 MG tablet Take 20 mg by mouth daily.     famotidine  (PEPCID ) 20 MG tablet One an hour before bed 30 tablet 11   FEROSUL 325 (65 Fe) MG tablet Take 325 mg by mouth daily.     fludrocortisone (FLORINEF) 0.1 MG tablet Take 0.2 mg by mouth daily.     Lancets (ONETOUCH DELICA PLUS LANCET33G) MISC Apply topically.     latanoprost (XALATAN) 0.005 % ophthalmic solution Place 1 drop into both eyes at bedtime.     montelukast  (SINGULAIR ) 10 MG tablet Take 1 tablet (10 mg total) by mouth at bedtime. 90 tablet 1   Multiple Vitamin (MULTIVITAMIN WITH MINERALS) TABS tablet Take 1 tablet by mouth daily.     nitroGLYCERIN  (NITROSTAT ) 0.4 MG SL tablet Place 1 tablet (0.4 mg total) under the tongue every 5 (five) minutes as needed for chest pain (if no relief after 3rd dose proceed to ER or call 911.). 25 tablet 0   ONETOUCH VERIO test strip SMARTSIG:1 Strip(s) Via Meter Twice Daily PRN     pantoprazole  (PROTONIX ) 40 MG tablet Take 30- 60 min before your first and last meals of the day     predniSONE  (DELTASONE ) 5 MG tablet Take 2 tablets (10 mg total) by mouth daily with breakfast. 180 tablet 1   pregabalin  (LYRICA ) 75 MG capsule Take 75 mg by mouth 2 (two) times daily.     Spacer/Aero-Holding Chambers DEVI 1 each by Does not apply route daily as needed. 1 each 2   SUMAtriptan  6 MG/0.5ML SOAJ Inject 6 mg as directed daily as needed (migraine).      TIROSINT  100 MCG CAPS Take 1 capsule (100 mcg  total) by mouth daily before breakfast. 90 capsule 1   Triamcinolone  Acetonide (NASACORT  ALLERGY  24HR NA) Place 2 sprays into the nose daily as needed (allergies).     valACYclovir  (VALTREX ) 500 MG tablet Take 500-2,000 mg by mouth See admin instructions. Take 2000mg s at first sign of  fever blister outbreak then take 500mg s daily until gone     XOPENEX  HFA 45 MCG/ACT inhaler INHALE TWO PUFFS BY MOUTH INTO LUNGS EVERY 6 HOURS AS NEEDED FOR WHEEZING 15 g 2   escitalopram (LEXAPRO) 10 MG tablet Take 1 tablet every day by oral route.     No current facility-administered medications for this visit.    Review of Systems: GENERAL: negative for malaise, night sweats HEENT: No changes in hearing or vision, no nose bleeds or other nasal problems. NECK: Negative for lumps, goiter, pain and significant neck swelling RESPIRATORY: Negative for cough, wheezing CARDIOVASCULAR: Negative for chest pain, leg swelling, palpitations, orthopnea GI: SEE HPI MUSCULOSKELETAL: Negative for joint pain or swelling, back pain, and muscle pain. SKIN: Negative for lesions, rash PSYCH: Negative for sleep disturbance, mood disorder and recent psychosocial stressors. HEMATOLOGY Negative for prolonged bleeding, bruising easily, and swollen nodes. ENDOCRINE: Negative for cold or heat intolerance, polyuria, polydipsia and goiter. NEURO: negative for tremor, gait imbalance, syncope and seizures. The remainder of the review of systems is noncontributory.   Physical Exam: BP 112/66 (BP Location: Left Arm, Patient Position: Sitting, Cuff Size: Normal)   Pulse 90   Temp 98 F (36.7 C) (Temporal)   Ht 5' 4 (1.626 m)   Wt 138 lb 11.2 oz (62.9 kg)   BMI 23.81 kg/m  GENERAL: The patient is AO x3, in no acute distress. On supplemetnal oxygen . HEENT: Head is normocephalic and atraumatic. EOMI are intact. Mouth is well hydrated and without lesions. NECK: Supple. No masses LUNGS: Clear to auscultation. No presence of  rhonchi/wheezing/rales. Adequate chest expansion HEART: RRR, normal s1 and s2. ABDOMEN: Soft, nontender, no guarding, no peritoneal signs, and nondistended. BS +. No masses. EXTREMITIES: Without any cyanosis, clubbing, rash, lesions or edema. NEUROLOGIC: AOx3, no focal motor deficit. SKIN: no jaundice, no rashes  Imaging/Labs: as above  I personally reviewed and interpreted the available labs, imaging and endoscopic files.  Impression and Plan: [name] is a 58 y.o.  female with past medical history of COPD on 3L/min, Hashimoto's thyroiditis, adrenal insufficiency, GERD, hyperlipidemia, sleep apnea, prediabetes,  who presents for follow up of anemia.  Patient presented with worsening anemia with borderline iron deficiency.  Has not presented any overt gastrointestinal bleeding.  As she has obtained clearance from cardiology and endocrinology to proceed with endoscopy, we will schedule her for EGD and colonoscopy.  This will help to evaluate her anemia, but also her episodes of recurrent heartburn -she has had the symptoms despite taking pantoprazole  twice a day and famotidine  at night.  We discussed that the on the endoscopic findings, we may switch her to Voquezna.  For now, she should continue taking her oral iron as prior.  -Schedule EGD and colonoscopy -Continue pantoprazole  40 mg twice a day for now and famotidine  at night -Depending on findings on EGD, we will start you on Voquezna -Continue oral iron supplementation daily  All questions were answered.      Melissa Fortune, MD Gastroenterology and Hepatology [facility] Gastroenterology

## 2023-10-24 NOTE — Progress Notes (Unsigned)
 [name], M.D. Gastroenterology & Hepatology [facility] Gastroenterology [address] [place], [place] [address]  Primary Care Physician: [name], MD [address] [name] [address]  I will communicate my assessment and recommendations to the referring MD via EMR.  Problems: Mild iron deficiency anemia  History of Present Illness: [name] is a 58 y.o.  female with past medical history of COPD on 3L/min, Hashimoto's thyroiditis, adrenal insufficiency, GERD, hyperlipidemia, sleep apnea, prediabetes,  who presents for follow up of anemia.  The patient was last seen on 07/01/2023. At that time, the patient was continued on oral iron and pantoprazole  40 mg twice daily and famotidine  at night.  Patient has been seen by Dr. Lenis who has been managing her thyroid  function with thyroid  supplementation.  Most recent labs from 2 weeks ago showed a TSH of 0.05 still suppressed with mildly elevated free T4 of 2.50 and free T3 of 4.6.  She has been cleared by Dr. Lenis.  Patient has been evaluated by cardiology for chest pain.  Had a nuclear PET/CT cardiac scan that was normal.  It was considered her chest pain was noncardiac in nature.  Review of most recent blood workup from 05/16/2023 showed a hemoglobin of 10.9, WBC 8.2, platelets 282, CMP with creatinine 1.41, BUN 13, sodium 145, potassium 3.9, albumin 4.6, total bilirubin is less than 0.2, AST 23, ALT 17, alkaline phosphatase 54.  Patient had iron stores checked on 03/14/2023 which showed normal TIBC 394, borderline iron saturation of 15%, iron of 59, negative hepatitis B surface antigen.   Patient states she has noticed some occasional episodes of melena. The patient denies having any nausea, vomiting, fever, chills, hematochezia, hematemesis, abdominal distention, abdominal pain, diarrhea, jaundice, pruritus or weight loss. She is currently ferosul 325 mg once a day, has taken this for last  6 months.  She has had issues with constipation, and has a BM every 4-5 days. May take Colace as needed, but may need some Dulcolax.  Patient reports that she is taking pantoprazole  40 mg twice a day and famotidine , with Tums, and this does not control her symptoms. She has heartburn every day.  Most recent labs from 10/04/2023 showed AST 17, ALT 19, alkaline phosphatase 57, albumin 4.2, potassium 4.1, sodium 143, creatinine 0.83, BUN 13.  Last EGD:>20 years ago, no report available   Last Colonoscopy:07/30/2013 Prep satisfactory. Normal mucosa of cecum, ascending colon, hepatic flexure, transverse colon, splenic flexure, descending and sigmoid colon. Normal rectal mucosa. hemorrhoids below the dentate line.  Past Medical History: Past Medical History:  Diagnosis Date   Anemia    Anxiety    Arthritis    Asthma    Cervical radiculopathy    Cluster headaches    COPD (chronic obstructive pulmonary disease) (HCC)    COPD (chronic obstructive pulmonary disease) with chronic bronchitis (HCC)    GERD (gastroesophageal reflux disease)    Hyperlipemia    Hypoglycemia    Hypothyroidism    Migraine    Pneumothorax    PONV (postoperative nausea and vomiting)    Pre-diabetes    Rectal discharge 07/28/2010   Shortness of breath    Sleep apnea    cannot tolerate-not using CPAP   Sluggishness 07/28/2010    Past Surgical History: Past Surgical History:  Procedure Laterality Date   ABDOMINAL HYSTERECTOMY     with bladder suspension   CERVICAL FUSION     CHOLECYSTECTOMY     COLONOSCOPY  N/A 07/30/2013   Procedure: COLONOSCOPY;  Surgeon: Claudis RAYMOND Rivet, MD;  Location: [facility];  Service: Endoscopy;  Laterality: N/A;  930   EXAM UNDER ANESTHESIA WITH MANIPULATION OF SHOULDER Right 10/11/2016   Procedure: EXAM UNDER ANESTHESIA WITH MANIPULATION OF SHOULDER;  Surgeon: Margrette Taft BRAVO, MD;  Location: [facility];  Service: Orthopedics;  Laterality: Right;   HERNIA REPAIR      INCISIONAL HERNIA REPAIR N/A 07/30/2016   Procedure: HERNIA REPAIR INCISIONAL WITH MESH;  Surgeon: Oneil Budge, MD;  Location: [facility];  Service: General;  Laterality: N/A;   INTERSTIM IMPLANT PLACEMENT  02/02/2021   Procedure: RENNA IMPLANT FIRST STAGE LEFT SACRUM ;  Surgeon: Sherrilee Belvie CROME, MD;  Location: [facility];  Service: Urology;;   RENNA IMPLANT PLACEMENT N/A 02/23/2021   Procedure: RENNA IMPLANT SECOND STAGE;  Surgeon: Sherrilee Belvie CROME, MD;  Location: [facility];  Service: Urology;  Laterality: N/A;  Rep coming, time needs to stay at 2:00   INTERSTIM IMPLANT REVISION N/A 10/25/2022   Procedure: REVISION OF RENNA;  Surgeon: Sherrilee Belvie CROME, MD;  Location: [facility];  Service: Urology;  Laterality: N/A;   JOINT REPLACEMENT     LOBECTOMY     right lung    POSTERIOR CERVICAL LAMINECTOMY Right 04/05/2017   Procedure: Right C6-7 Posterior cervical laminectomy;  Surgeon: Onetha Kuba, MD;  Location: [facility] OR;  Service: Neurosurgery;  Laterality: Right;  Right C6-7 Posterior cervical laminectomy   SHOULDER ARTHROSCOPY WITH ROTATOR CUFF REPAIR Right 10/11/2016   Procedure: SHOULDER ARTHROSCOPY;  Surgeon: Margrette Taft BRAVO, MD;  Location: [facility];  Service: Orthopedics;  Laterality: Right;   TOTAL HIP ARTHROPLASTY Right 04/15/2020   Procedure: RIGHT TOTAL HIP ARTHROPLASTY ANTERIOR APPROACH;  Surgeon: Vernetta Lonni GRADE, MD;  Location: [facility];  Service: Orthopedics;  Laterality: Right;   TOTAL HIP ARTHROPLASTY Left 07/22/2020   Procedure: LEFT  HIP ARTHROPLASTY ANTERIOR APPROACH;  Surgeon: Vernetta Lonni GRADE, MD;  Location: [facility];  Service: Orthopedics;  Laterality: Left;  RNFA   TUBAL LIGATION      Family History: Family History  Problem Relation Age of Onset   Neuropathy Mother    Migraines Mother    Cancer Mother    COPD Mother    Diabetes Mother    Hyperlipidemia Mother    Hypertension Mother    Hyperlipidemia Father    Hypertension Father    Migraines  Sister    Migraines Maternal Aunt    Stroke Maternal Grandmother    Heart failure Maternal Grandmother    Hypertension Maternal Grandmother    Thyroid  disease Maternal Grandmother    Diabetes Paternal Grandmother    Alzheimer's disease Paternal Grandmother    Stroke Paternal Grandfather    Heart failure Paternal Grandfather    Hypertension Paternal Grandfather    Bipolar disorder Son    Colon cancer Neg Hx     Social History: Social History   Tobacco Use  Smoking Status Former   Current packs/day: 0.00   Average packs/day: 1 pack/day for 30.0 years (30.0 ttl pk-yrs)   Types: Cigarettes   Start date: 02/20/1978   Quit date: 02/21/2008   Years since quitting: 15.6  Smokeless Tobacco Never   Social History   Substance and Sexual Activity  Alcohol Use No   Alcohol/week: 0.0 standard drinks of alcohol   Comment: no   Social History   Substance and Sexual Activity  Drug Use No    Allergies: Allergies  Allergen Reactions   Aspirin   Shortness Of Breath    Causes asthma flares    Penicillins Other (See Comments)    UNSPECIFIED REACTION OF CHILDHOOD  Has patient had a PCN reaction causing immediate rash, facial/tongue/throat swelling, SOB or lightheadedness with hypotension: NO Has patient had a PCN reaction causing severe rash involving mucus membranes or skin necrosis: NO Has patient had a PCN reaction that required hospitalization: no Has patient had a PCN reaction occurring within the last 10 years: NO If all of the above answers are NO, then may proceed with Cephalosporin use.    Vicodin [Hydrocodone -Acetaminophen ] Other (See Comments)    Headaches     Medications: Current Outpatient Medications  Medication Sig Dispense Refill   albuterol  (PROVENTIL ) (2.5 MG/3ML) 0.083% nebulizer solution USE 1 VIAL IN NEBULIZER EVERY 6 HOURS AS NEEDED FOR WHEEZING OR SHORTNESS OF BREATH 75 mL 0   atorvastatin (LIPITOR) 10 MG tablet Take 1 tablet by mouth daily.      benralizumab  (FASENRA  PEN) 30 MG/ML prefilled autoinjector Inject 1 mL (30 mg total) into the skin every 8 (eight) weeks. 1 mL 6   budesonide -glycopyrrolate -formoterol  (BREZTRI  AEROSPHERE) 160-9-4.8 MCG/ACT AERO inhaler Inhale 2 puffs into the lungs in the morning and at bedtime. 3 each 4   Cariprazine HCl (VRAYLAR PO) Take 1 capsule by mouth daily.     cetirizine (ZYRTEC) 10 MG tablet Take 10 mg by mouth daily.     Cholecalciferol (VITAMIN D3 SUPER STRENGTH PO) Take 5,000 Units by mouth daily.     citalopram  (CELEXA ) 20 MG tablet Take 20 mg by mouth daily.     famotidine  (PEPCID ) 20 MG tablet One an hour before bed 30 tablet 11   FEROSUL 325 (65 Fe) MG tablet Take 325 mg by mouth daily.     fludrocortisone (FLORINEF) 0.1 MG tablet Take 0.2 mg by mouth daily.     Lancets (ONETOUCH DELICA PLUS LANCET33G) MISC Apply topically.     latanoprost (XALATAN) 0.005 % ophthalmic solution Place 1 drop into both eyes at bedtime.     montelukast  (SINGULAIR ) 10 MG tablet Take 1 tablet (10 mg total) by mouth at bedtime. 90 tablet 1   Multiple Vitamin (MULTIVITAMIN WITH MINERALS) TABS tablet Take 1 tablet by mouth daily.     nitroGLYCERIN  (NITROSTAT ) 0.4 MG SL tablet Place 1 tablet (0.4 mg total) under the tongue every 5 (five) minutes as needed for chest pain (if no relief after 3rd dose proceed to ER or call 911.). 25 tablet 0   ONETOUCH VERIO test strip SMARTSIG:1 Strip(s) Via Meter Twice Daily PRN     pantoprazole  (PROTONIX ) 40 MG tablet Take 30- 60 min before your first and last meals of the day     predniSONE  (DELTASONE ) 5 MG tablet Take 2 tablets (10 mg total) by mouth daily with breakfast. 180 tablet 1   pregabalin  (LYRICA ) 75 MG capsule Take 75 mg by mouth 2 (two) times daily.     Spacer/Aero-Holding Chambers DEVI 1 each by Does not apply route daily as needed. 1 each 2   SUMAtriptan  6 MG/0.5ML SOAJ Inject 6 mg as directed daily as needed (migraine).      TIROSINT  100 MCG CAPS Take 1 capsule (100 mcg  total) by mouth daily before breakfast. 90 capsule 1   Triamcinolone  Acetonide (NASACORT  ALLERGY  24HR NA) Place 2 sprays into the nose daily as needed (allergies).     valACYclovir  (VALTREX ) 500 MG tablet Take 500-2,000 mg by mouth See admin instructions. Take 2000mg s at first sign of  fever blister outbreak then take 500mg s daily until gone     XOPENEX  HFA 45 MCG/ACT inhaler INHALE TWO PUFFS BY MOUTH INTO LUNGS EVERY 6 HOURS AS NEEDED FOR WHEEZING 15 g 2   escitalopram (LEXAPRO) 10 MG tablet Take 1 tablet every day by oral route.     No current facility-administered medications for this visit.    Review of Systems: GENERAL: negative for malaise, night sweats HEENT: No changes in hearing or vision, no nose bleeds or other nasal problems. NECK: Negative for lumps, goiter, pain and significant neck swelling RESPIRATORY: Negative for cough, wheezing CARDIOVASCULAR: Negative for chest pain, leg swelling, palpitations, orthopnea GI: SEE HPI MUSCULOSKELETAL: Negative for joint pain or swelling, back pain, and muscle pain. SKIN: Negative for lesions, rash PSYCH: Negative for sleep disturbance, mood disorder and recent psychosocial stressors. HEMATOLOGY Negative for prolonged bleeding, bruising easily, and swollen nodes. ENDOCRINE: Negative for cold or heat intolerance, polyuria, polydipsia and goiter. NEURO: negative for tremor, gait imbalance, syncope and seizures. The remainder of the review of systems is noncontributory.   Physical Exam: BP 112/66 (BP Location: Left Arm, Patient Position: Sitting, Cuff Size: Normal)   Pulse 90   Temp 98 F (36.7 C) (Temporal)   Ht 5' 4 (1.626 m)   Wt 138 lb 11.2 oz (62.9 kg)   BMI 23.81 kg/m  GENERAL: The patient is AO x3, in no acute distress. On supplemetnal oxygen . HEENT: Head is normocephalic and atraumatic. EOMI are intact. Mouth is well hydrated and without lesions. NECK: Supple. No masses LUNGS: Clear to auscultation. No presence of  rhonchi/wheezing/rales. Adequate chest expansion HEART: RRR, normal s1 and s2. ABDOMEN: Soft, nontender, no guarding, no peritoneal signs, and nondistended. BS +. No masses. EXTREMITIES: Without any cyanosis, clubbing, rash, lesions or edema. NEUROLOGIC: AOx3, no focal motor deficit. SKIN: no jaundice, no rashes  Imaging/Labs: as above  I personally reviewed and interpreted the available labs, imaging and endoscopic files.  Impression and Plan: [name] is a 58 y.o.  female with past medical history of COPD on 3L/min, Hashimoto's thyroiditis, adrenal insufficiency, GERD, hyperlipidemia, sleep apnea, prediabetes,  who presents for follow up of anemia.  Patient presented with worsening anemia with borderline iron deficiency.  Has not presented any overt gastrointestinal bleeding.  As she has obtained clearance from cardiology and endocrinology to proceed with endoscopy, we will schedule her for EGD and colonoscopy.  This will help to evaluate her anemia, but also her episodes of recurrent heartburn -she has had the symptoms despite taking pantoprazole  twice a day and famotidine  at night.  We discussed that the on the endoscopic findings, we may switch her to Voquezna.  For now, she should continue taking her oral iron as prior.  -Schedule EGD and colonoscopy -Continue pantoprazole  40 mg twice a day for now and famotidine  at night -Depending on findings on EGD, we will start you on Voquezna -Continue oral iron supplementation daily  All questions were answered.      [name], MD Gastroenterology and Hepatology [facility] Gastroenterology

## 2023-10-25 ENCOUNTER — Encounter: Payer: Self-pay | Admitting: *Deleted

## 2023-10-25 ENCOUNTER — Telehealth: Payer: Self-pay | Admitting: *Deleted

## 2023-10-25 NOTE — Telephone Encounter (Signed)
 LMOVM to return call  TCS/EGD, asa 3 w/Dr.Castandeda

## 2023-10-28 NOTE — Patient Instructions (Signed)
 [name]  10/28/2023     @PREFPERIOPPHARMACY @   Your procedure is scheduled on  10/31/2023.   Report to [facility] at  0745 A.M.   Call this number if you have problems the morning of surgery:  [phone]  If you experience any cold or flu symptoms such as cough, fever, chills, shortness of breath, etc. between now and your scheduled surgery, please notify us  at the above number.   Remember:        Use your inhalers before you come and bring your rescue inhaler with you.   Follow the diet and prep instructions given to you by the office.   You may drink clear liquids until 0545 am 10/31/2023.    Clear liquids allowed are:                    Water , Juice (No red color; non-citric and without pulp; diabetics please choose diet or no sugar options), Carbonated beverages (diabetics please choose diet or no sugar options), Clear Tea (No creamer, milk, or cream, including half & half and powdered creamer), Black Coffee Only (No creamer, milk or cream, including half & half and powdered creamer), and Clear Sports drink (No red color; diabetics please choose diet or no sugar options)    Take these medicines the morning of surgery with A SIP OF WATER         citalopram , escitalopram, pantoprazole , prednisone , pregabalin , sumatriptan , tirosint .    Do not wear jewelry, make-up or nail polish, including gel polish,  artificial nails, or any other type of covering on natural nails (fingers and  toes).  Do not wear lotions, powders, or perfumes, or deodorant.  Do not shave 48 hours prior to surgery.  Men may shave face and neck.  Do not bring valuables to the hospital.  [facility] is not responsible for any belongings or valuables.  Contacts, dentures or bridgework may not be worn into surgery.  Leave your suitcase in the car.  After surgery it may be brought to your room.  For patients admitted to the hospital, discharge time will be determined by your treatment  team.  Patients discharged the day of surgery will not be allowed to drive home.    Special instructions:   DO NOT smoke tobacco or vape for 24 hours before your procedure.  Please read over the following fact sheets that you were given. Anesthesia Post-op Instructions and Care and Recovery After Surgery      Upper Endoscopy, Adult, Care After After the procedure, it is common to have a sore throat. It is also common to have: Mild stomach pain or discomfort. Bloating. Nausea. Follow these instructions at home: The instructions below may help you care for yourself at home. Your health care provider may give you more instructions. If you have questions, ask your health care provider. If you were given a sedative during the procedure, it can affect you for several hours. Do not drive or operate machinery until your health care provider says that it is safe. If you will be going home right after the procedure, plan to have a responsible adult: Take you home from the hospital or clinic. You will not be allowed to drive. Care for you for the time you are told. Follow instructions from your health care provider about what you may eat and drink. Return to your normal activities as told by your health care provider. Ask your health  care provider what activities are safe for you. Take over-the-counter and prescription medicines only as told by your health care provider. Contact a health care provider if you: Have a sore throat that lasts longer than one day. Have trouble swallowing. Have a fever. Get help right away if you: Vomit blood or your vomit looks like coffee grounds. Have bloody, black, or tarry stools. Have a very bad sore throat or you cannot swallow. Have difficulty breathing or very bad pain in your chest or abdomen. These symptoms may be an emergency. Get help right away. Call 911. Do not wait to see if the symptoms will go away. Do not drive yourself to the  hospital. Summary After the procedure, it is common to have a sore throat, mild stomach discomfort, bloating, and nausea. If you were given a sedative during the procedure, it can affect you for several hours. Do not drive until your health care provider says that it is safe. Follow instructions from your health care provider about what you may eat and drink. Return to your normal activities as told by your health care provider. This information is not intended to replace advice given to you by your health care provider. Make sure you discuss any questions you have with your health care provider. Document Revised: 07/05/2021 Document Reviewed: 07/05/2021 Elsevier Patient Education  2024 [employer].Colonoscopy, Adult, Care After The following information offers guidance on how to care for yourself after your procedure. Your health care provider may also give you more specific instructions. If you have problems or questions, contact your health care provider. What can I expect after the procedure? After the procedure, it is common to have: A small amount of blood in your stool for 24 hours after the procedure. Some gas. Mild cramping or bloating of your abdomen. Follow these instructions at home: Eating and drinking  Drink enough fluid to keep your urine pale yellow. Follow instructions from your health care provider about eating or drinking restrictions. Resume your normal diet as told by your health care provider. Avoid heavy or fried foods that are hard to digest. Activity Rest as told by your health care provider. Avoid sitting for a long time without moving. Get up to take short walks every 1-2 hours. This is important to improve blood flow and breathing. Ask for help if you feel weak or unsteady. Return to your normal activities as told by your health care provider. Ask your health care provider what activities are safe for you. Managing cramping and bloating  Try walking around  when you have cramps or feel bloated. If directed, apply heat to your abdomen as told by your health care provider. Use the heat source that your health care provider recommends, such as a moist heat pack or a heating pad. Place a towel between your skin and the heat source. Leave the heat on for 20-30 minutes. Remove the heat if your skin turns bright red. This is especially important if you are unable to feel pain, heat, or cold. You have a greater risk of getting burned. General instructions If you were given a sedative during the procedure, it can affect you for several hours. Do not drive or operate machinery until your health care provider says that it is safe. For the first 24 hours after the procedure: Do not sign important documents. Do not drink alcohol. Do your regular daily activities at a slower pace than normal. Eat soft foods that are easy to digest. Take over-the-counter and prescription  medicines only as told by your health care provider. Keep all follow-up visits. This is important. Contact a health care provider if: You have blood in your stool 2-3 days after the procedure. Get help right away if: You have more than a small spotting of blood in your stool. You have large blood clots in your stool. You have swelling of your abdomen. You have nausea or vomiting. You have a fever. You have increasing pain in your abdomen that is not relieved with medicine. These symptoms may be an emergency. Get help right away. Call 911. Do not wait to see if the symptoms will go away. Do not drive yourself to the hospital. Summary After the procedure, it is common to have a small amount of blood in your stool. You may also have mild cramping and bloating of your abdomen. If you were given a sedative during the procedure, it can affect you for several hours. Do not drive or operate machinery until your health care provider says that it is safe. Get help right away if you have a lot of  blood in your stool, nausea or vomiting, a fever, or increased pain in your abdomen. This information is not intended to replace advice given to you by your health care provider. Make sure you discuss any questions you have with your health care provider. Document Revised: 05/08/2022 Document Reviewed: 11/16/2020 Elsevier Patient Education  2024 Else[employer] Inc.General Anesthesia, Adult, Care After The following information offers guidance on how to care for yourself after your procedure. Your health care provider may also give you more specific instructions. If you have problems or questions, contact your health care provider. What can I expect after the procedure? After the procedure, it is common for people to: Have pain or discomfort at the IV site. Have nausea or vomiting. Have a sore throat or hoarseness. Have trouble concentrating. Feel cold or chills. Feel weak, sleepy, or tired (fatigue). Have soreness and body aches. These can affect parts of the body that were not involved in surgery. Follow these instructions at home: For the time period you were told by your health care provider:  Rest. Do not participate in activities where you could fall or become injured. Do not drive or use machinery. Do not drink alcohol. Do not take sleeping pills or medicines that cause drowsiness. Do not make important decisions or sign legal documents. Do not take care of children on your own. General instructions Drink enough fluid to keep your urine pale yellow. If you have sleep apnea, surgery and certain medicines can increase your risk for breathing problems. Follow instructions from your health care provider about wearing your sleep device: Anytime you are sleeping, including during daytime naps. While taking prescription pain medicines, sleeping medicines, or medicines that make you drowsy. Return to your normal activities as told by your health care provider. Ask your health care provider what  activities are safe for you. Take over-the-counter and prescription medicines only as told by your health care provider. Do not use any products that contain nicotine or tobacco. These products include cigarettes, chewing tobacco, and vaping devices, such as e-cigarettes. These can delay incision healing after surgery. If you need help quitting, ask your health care provider. Contact a health care provider if: You have nausea or vomiting that does not get better with medicine. You vomit every time you eat or drink. You have pain that does not get better with medicine. You cannot urinate or have bloody urine. You develop a  skin rash. You have a fever. Get help right away if: You have trouble breathing. You have chest pain. You vomit blood. These symptoms may be an emergency. Get help right away. Call 911. Do not wait to see if the symptoms will go away. Do not drive yourself to the hospital. Summary After the procedure, it is common to have a sore throat, hoarseness, nausea, vomiting, or to feel weak, sleepy, or fatigue. For the time period you were told by your health care provider, do not drive or use machinery. Get help right away if you have difficulty breathing, have chest pain, or vomit blood. These symptoms may be an emergency. This information is not intended to replace advice given to you by your health care provider. Make sure you discuss any questions you have with your health care provider. Document Revised: 06/23/2021 Document Reviewed: 06/23/2021 Elsevier Patient Education  2024 [employer].

## 2023-10-29 ENCOUNTER — Ambulatory Visit (INDEPENDENT_AMBULATORY_CARE_PROVIDER_SITE_OTHER): Payer: Self-pay | Admitting: Gastroenterology

## 2023-10-29 ENCOUNTER — Encounter (HOSPITAL_COMMUNITY)
Admission: RE | Admit: 2023-10-29 | Discharge: 2023-10-29 | Disposition: A | Discharge status: Home / Self Care | Source: Ambulatory Visit | Attending: Gastroenterology | Admitting: Gastroenterology

## 2023-10-29 ENCOUNTER — Encounter (HOSPITAL_COMMUNITY): Payer: Self-pay

## 2023-10-29 VITALS — BP 101/58 | HR 85 | Resp 18 | Ht 64.0 in | Wt 138.7 lb

## 2023-10-29 DIAGNOSIS — D649 Anemia, unspecified: Secondary | ICD-10-CM | POA: Diagnosis not present

## 2023-10-29 DIAGNOSIS — R7303 Prediabetes: Secondary | ICD-10-CM | POA: Insufficient documentation

## 2023-10-29 DIAGNOSIS — Z01812 Encounter for preprocedural laboratory examination: Secondary | ICD-10-CM | POA: Diagnosis present

## 2023-10-29 DIAGNOSIS — E069 Thyroiditis, unspecified: Secondary | ICD-10-CM | POA: Insufficient documentation

## 2023-10-29 HISTORY — DX: Chronic kidney disease, unspecified: N18.9

## 2023-10-29 HISTORY — DX: Dependence on supplemental oxygen: Z99.81

## 2023-10-29 HISTORY — DX: Other abnormalities of gait and mobility: R26.89

## 2023-10-29 HISTORY — DX: Unspecified adrenocortical insufficiency: E27.40

## 2023-10-29 LAB — CBC WITH DIFFERENTIAL/PLATELET
Abs Immature Granulocytes: 0.09 K/uL — ABNORMAL HIGH (ref 0.00–0.07)
Basophils Absolute: 0 K/uL (ref 0.0–0.1)
Basophils Relative: 0 %
Eosinophils Absolute: 0 K/uL (ref 0.0–0.5)
Eosinophils Relative: 0 %
HCT: 36 % (ref 36.0–46.0)
Hemoglobin: 11.6 g/dL — ABNORMAL LOW (ref 12.0–15.0)
Immature Granulocytes: 1 %
Lymphocytes Relative: 13 %
Lymphs Abs: 1.3 K/uL (ref 0.7–4.0)
MCH: 30.2 pg (ref 26.0–34.0)
MCHC: 32.2 g/dL (ref 30.0–36.0)
MCV: 93.8 fL (ref 80.0–100.0)
Monocytes Absolute: 0.4 K/uL (ref 0.1–1.0)
Monocytes Relative: 4 %
Neutro Abs: 7.9 K/uL — ABNORMAL HIGH (ref 1.7–7.7)
Neutrophils Relative %: 82 %
Platelets: 285 K/uL (ref 150–400)
RBC: 3.84 MIL/uL — ABNORMAL LOW (ref 3.87–5.11)
RDW: 12.4 % (ref 11.5–15.5)
WBC: 9.7 K/uL (ref 4.0–10.5)
nRBC: 0 % (ref 0.0–0.2)

## 2023-10-29 LAB — TSH: TSH: 0.083 u[IU]/mL — ABNORMAL LOW (ref 0.350–4.500)

## 2023-10-29 LAB — BASIC METABOLIC PANEL WITH GFR
Anion gap: 9 (ref 5–15)
BUN: 11 mg/dL (ref 6–20)
CO2: 26 mmol/L (ref 22–32)
Calcium: 8.9 mg/dL (ref 8.9–10.3)
Chloride: 104 mmol/L (ref 98–111)
Creatinine, Ser: 0.94 mg/dL (ref 0.44–1.00)
GFR, Estimated: >60 mL/min (ref >60)
Glucose, Bld: 176 mg/dL — ABNORMAL HIGH (ref 70–99)
Potassium: 3.7 mmol/L (ref 3.5–5.1)
Sodium: 139 mmol/L (ref 135–145)

## 2023-10-29 LAB — T4, FREE: Free T4: 1.3 ng/dL — ABNORMAL HIGH (ref 0.61–1.12)

## 2023-10-30 LAB — T3, FREE: T3, Free: 2.7 pg/mL (ref 2.0–4.4)

## 2023-10-31 ENCOUNTER — Encounter (INDEPENDENT_AMBULATORY_CARE_PROVIDER_SITE_OTHER): Payer: Self-pay | Admitting: *Deleted

## 2023-10-31 ENCOUNTER — Encounter (HOSPITAL_COMMUNITY)
Admission: RE | Disposition: A | Discharge status: Home / Self Care | Payer: Self-pay | Source: Home / Self Care | Attending: Gastroenterology

## 2023-10-31 ENCOUNTER — Ambulatory Visit (HOSPITAL_COMMUNITY): Payer: Self-pay | Admitting: Anesthesiology

## 2023-10-31 ENCOUNTER — Encounter (HOSPITAL_COMMUNITY): Payer: Self-pay | Admitting: Anesthesiology

## 2023-10-31 ENCOUNTER — Encounter (HOSPITAL_COMMUNITY): Payer: Self-pay | Admitting: Gastroenterology

## 2023-10-31 ENCOUNTER — Other Ambulatory Visit: Payer: Self-pay

## 2023-10-31 ENCOUNTER — Ambulatory Visit (HOSPITAL_COMMUNITY)
Admission: RE | Admit: 2023-10-31 | Discharge: 2023-10-31 | Disposition: A | Discharge status: Home / Self Care | Attending: Gastroenterology | Admitting: Gastroenterology

## 2023-10-31 DIAGNOSIS — G4730 Sleep apnea, unspecified: Secondary | ICD-10-CM | POA: Insufficient documentation

## 2023-10-31 DIAGNOSIS — K2950 Unspecified chronic gastritis without bleeding: Secondary | ICD-10-CM

## 2023-10-31 DIAGNOSIS — K649 Unspecified hemorrhoids: Secondary | ICD-10-CM | POA: Diagnosis not present

## 2023-10-31 DIAGNOSIS — K5730 Diverticulosis of large intestine without perforation or abscess without bleeding: Secondary | ICD-10-CM | POA: Diagnosis not present

## 2023-10-31 DIAGNOSIS — K648 Other hemorrhoids: Secondary | ICD-10-CM | POA: Diagnosis not present

## 2023-10-31 DIAGNOSIS — K209 Esophagitis, unspecified without bleeding: Secondary | ICD-10-CM | POA: Diagnosis not present

## 2023-10-31 DIAGNOSIS — K635 Polyp of colon: Secondary | ICD-10-CM

## 2023-10-31 DIAGNOSIS — D509 Iron deficiency anemia, unspecified: Secondary | ICD-10-CM | POA: Diagnosis present

## 2023-10-31 DIAGNOSIS — K2100 Gastro-esophageal reflux disease with esophagitis, without bleeding: Secondary | ICD-10-CM | POA: Diagnosis not present

## 2023-10-31 DIAGNOSIS — Z87891 Personal history of nicotine dependence: Secondary | ICD-10-CM | POA: Diagnosis not present

## 2023-10-31 DIAGNOSIS — I10 Essential (primary) hypertension: Secondary | ICD-10-CM | POA: Diagnosis not present

## 2023-10-31 DIAGNOSIS — J4489 Other specified chronic obstructive pulmonary disease: Secondary | ICD-10-CM | POA: Insufficient documentation

## 2023-10-31 DIAGNOSIS — K317 Polyp of stomach and duodenum: Secondary | ICD-10-CM

## 2023-10-31 HISTORY — PX: COLONOSCOPY: SHX5424

## 2023-10-31 HISTORY — PX: ESOPHAGOGASTRODUODENOSCOPY: SHX5428

## 2023-10-31 LAB — GLUCOSE, CAPILLARY: Glucose-Capillary: 100 mg/dL — ABNORMAL HIGH (ref 70–99)

## 2023-10-31 LAB — HM COLONOSCOPY

## 2023-10-31 SURGERY — COLONOSCOPY
Anesthesia: General

## 2023-10-31 MED ORDER — PHENYLEPHRINE 80 MCG/ML (10ML) SYRINGE FOR IV PUSH (FOR BLOOD PRESSURE SUPPORT)
PREFILLED_SYRINGE | INTRAVENOUS | Status: DC | PRN
  Administered 2023-10-31 (×4): 80 ug via INTRAVENOUS

## 2023-10-31 MED ORDER — VOQUEZNA 10 MG PO TABS: 1.0000 | ORAL_TABLET | Freq: Every day | ORAL | 3 refills | Status: DC

## 2023-10-31 MED ORDER — PROPOFOL 500 MG/50ML IV EMUL
INTRAVENOUS | Status: DC | PRN
Start: 2023-10-31 — End: 2023-10-31
  Administered 2023-10-31: 75 ug/kg/min via INTRAVENOUS
  Administered 2023-10-31 (×2): 20 mg via INTRAVENOUS
  Administered 2023-10-31: 100 mg via INTRAVENOUS

## 2023-10-31 MED ORDER — LACTATED RINGERS IV SOLN: INTRAVENOUS | Status: DC

## 2023-10-31 MED ORDER — STERILE WATER FOR IRRIGATION IR SOLN
Status: DC | PRN
  Administered 2023-10-31: 50 mL

## 2023-10-31 MED ORDER — VOQUEZNA 20 MG PO TABS: 1.0000 | ORAL_TABLET | Freq: Every day | ORAL | 0 refills | Status: DC

## 2023-10-31 NOTE — Op Note (Signed)
 [facility] Patient Name: [name] Procedure Date: 10/31/2023 10:12 AM MRN: [id number] Date of Birth: Nov 08, 1965 Attending MD: [name] , , [id number] CSN: [id number] Age: 58 Admit Type: Outpatient Procedure:                Colonoscopy Indications:              Iron deficiency anemia Providers:                [name], Jon LABOR. Museum/gallery exhibitions officer, Charity fundraiser,                            Mickel CROME. Shirlean Balm, Technician Referring MD:              Medicines:                Monitored Anesthesia Care Complications:            No immediate complications. Estimated Blood Loss:     Estimated blood loss: none. Procedure:                Pre-Anesthesia Assessment:                           - Prior to the procedure, a History and Physical                            was performed, and patient medications, allergies                            and sensitivities were reviewed. The patient's                            tolerance of previous anesthesia was reviewed.                           - The risks and benefits of the procedure and the                            sedation options and risks were discussed with the                            patient. All questions were answered and informed                            consent was obtained.                           - ASA Grade Assessment: III - A patient with severe                            systemic disease.                           After obtaining informed consent, the colonoscope                            was passed under direct vision. Throughout the  procedure, the patient's blood pressure, pulse, and                            oxygen  saturations were monitored continuously. The                            [id number]-[id number] ([id number]) scope was introduced through                            the anus and advanced to the the terminal ileum.                            The colonoscopy was performed without  difficulty.                            The patient tolerated the procedure well. The                            quality of the bowel preparation was excellent. Scope In: 10:15:00 AM Scope Out: 10:40:11 AM Scope Withdrawal Time: 0 hours 20 minutes 27 seconds  Total Procedure Duration: 0 hours 25 minutes 11 seconds  Findings:      The perianal and digital rectal examinations were normal.      The terminal ileum appeared normal.      A 3 mm polyp was found in the descending colon. The polyp was sessile.       The polyp was removed with a cold snare. Resection and retrieval were       complete.      Scattered small-mouthed diverticula were found in the sigmoid colon and       descending colon.      Non-bleeding internal hemorrhoids were found during retroflexion. The       hemorrhoids were small. Impression:               - The examined portion of the ileum was normal.                           - One 3 mm polyp in the descending colon, removed                            with a cold snare. Resected and retrieved.                           - Diverticulosis in the sigmoid colon and in the                            descending colon.                           - Non-bleeding internal hemorrhoids. Moderate Sedation:      Per Anesthesia Care Recommendation:           - Discharge patient to home (ambulatory).                           - Resume previous diet.                           -  Await pathology results.                           - Repeat colonoscopy for surveillance based on                            pathology results. Procedure Code(s):        --- Professional ---                           [phone], Colonoscopy, flexible; with removal of                            tumor(s), polyp(s), or other lesion(s) by snare                            technique Diagnosis Code(s):        --- Professional ---                           K64.8, Other hemorrhoids                           D12.4, Benign  neoplasm of descending colon                           D50.9, Iron deficiency anemia, unspecified                           K57.30, Diverticulosis of large intestine without                            perforation or abscess without bleeding CPT copyright 2022 American Medical Association. All rights reserved. The codes documented in this report are preliminary and upon coder review may  be revised to meet current compliance requirements. [name], MD [name],  10/31/2023 10:54:33 AM This report has been signed electronically. Number of Addenda: 0

## 2023-10-31 NOTE — Anesthesia Preprocedure Evaluation (Signed)
 Anesthesia Evaluation  Patient identified by MRN, date of birth, ID band Patient awake    Reviewed: Allergy  & Precautions, H&P , NPO status , Patient's Chart, lab work & pertinent test results, reviewed documented beta blocker date and time   History of Anesthesia Complications (+) PONV and history of anesthetic complications  Airway Mallampati: II  TM Distance: >3 FB Neck ROM: full    Dental no notable dental hx.    Pulmonary neg pulmonary ROS, shortness of breath, asthma , sleep apnea , COPD, former smoker   Pulmonary exam normal breath sounds clear to auscultation       Cardiovascular Exercise Tolerance: Good hypertension, negative cardio ROS  Rhythm:regular Rate:Normal     Neuro/Psych  Headaches PSYCHIATRIC DISORDERS Anxiety Depression     Neuromuscular disease negative neurological ROS  negative psych ROS   GI/Hepatic negative GI ROS, Neg liver ROS,GERD  ,,  Endo/Other  negative endocrine ROSHypothyroidism    Renal/GU Renal diseasenegative Renal ROS  negative genitourinary   Musculoskeletal   Abdominal   Peds  Hematology negative hematology ROS (+) Blood dyscrasia, anemia   Anesthesia Other Findings   Reproductive/Obstetrics negative OB ROS                              Anesthesia Physical Anesthesia Plan  ASA: 3  Anesthesia Plan: General   Post-op Pain Management:    Induction:   PONV Risk Score and Plan: Propofol  infusion  Airway Management Planned:   Additional Equipment:   Intra-op Plan:   Post-operative Plan:   Informed Consent: I have reviewed the patients History and Physical, chart, labs and discussed the procedure including the risks, benefits and alternatives for the proposed anesthesia with the patient or authorized representative who has indicated his/her understanding and acceptance.     Dental Advisory Given  Plan Discussed with: CRNA  Anesthesia  Plan Comments:         Anesthesia Quick Evaluation

## 2023-10-31 NOTE — Interval H&P Note (Signed)
 History and Physical Interval Note:  10/31/2023 7:39 AM  [name]  has presented today for surgery, with the diagnosis of IDA.  The various methods of treatment have been discussed with the patient and family. After consideration of risks, benefits and other options for treatment, the patient has consented to  Procedure(s) with comments: COLONOSCOPY (N/A) - 9:45 am, asa 3 EGD (ESOPHAGOGASTRODUODENOSCOPY) (N/A) as a surgical intervention.  The patient's history has been reviewed, patient examined, no change in status, stable for surgery.  I have reviewed the patient's chart and labs.  Questions were answered to the patient's satisfaction.     [name]

## 2023-10-31 NOTE — Op Note (Addendum)
 [facility] Patient Name: [name] Procedure Date: 10/31/2023 9:47 AM MRN: [id number] Date of Birth: 06/05/1965 Attending MD: [name] , , [id number] CSN: [id number] Age: 58 Admit Type: Outpatient Procedure:                Upper GI endoscopy Indications:              Iron deficiency anemia, Heartburn Providers:                [name], Jon LABOR. Museum/gallery exhibitions officer, Charity fundraiser,                            Mickel CROME. Shirlean Balm, Technician Referring MD:              Medicines:                Monitored Anesthesia Care Complications:            No immediate complications. Estimated Blood Loss:     Estimated blood loss: none. Procedure:                Pre-Anesthesia Assessment:                           - Prior to the procedure, a History and Physical                            was performed, and patient medications, allergies                            and sensitivities were reviewed. The patient's                            tolerance of previous anesthesia was reviewed.                           - The risks and benefits of the procedure and the                            sedation options and risks were discussed with the                            patient. All questions were answered and informed                            consent was obtained.                           - ASA Grade Assessment: III - A patient with severe                            systemic disease.                           After obtaining informed consent, the endoscope was                            passed under direct vision.  Throughout the                            procedure, the patient's blood pressure, pulse, and                            oxygen  saturations were monitored continuously. The                            [id number]-[id number] ([id number]) scope was introduced through the                            mouth, and advanced to the second part of duodenum.                            The upper GI endoscopy was  accomplished without                            difficulty. The patient tolerated the procedure                            well. Scope In: 10:01:36 AM Scope Out: 10:10:09 AM Total Procedure Duration: 0 hours 8 minutes 33 seconds  Findings:      The Z-line was irregular and was found 37 cm from the incisors. Biopsies       were taken with a cold forceps for histology.      The stomach was normal.      The examined duodenum was normal. Biopsies were taken with a cold       forceps for histology. Impression:               - Z-line irregular, 37 cm from the incisors.                            Biopsied.                           - Normal stomach.                           - Normal examined duodenum. Biopsied. Moderate Sedation:      Per Anesthesia Care Recommendation:           - Discharge patient to home (ambulatory).                           - Resume previous diet.                           - Await pathology results.                           - Continue present medications.                           - Start Voquezna  20 mg qday for 8 weeks, then 10 mg  qday - will need to stop pantoprazole  and                            famotidine .                           - If persisting symptoms despite taking Voquezna ,                            will need to discuss Bravo placement vs pH                            impedacemetry while on medication Procedure Code(s):        --- Professional ---                           [phone], Esophagogastroduodenoscopy, flexible,                            transoral; with biopsy, single or multiple Diagnosis Code(s):        --- Professional ---                           K22.89, Other specified disease of esophagus                           D50.9, Iron deficiency anemia, unspecified CPT copyright 2022 American Medical Association. All rights reserved. The codes documented in this report are preliminary and upon coder review may  be  revised to meet current compliance requirements. [name], MD [name],  10/31/2023 10:52:34 AM This report has been signed electronically. Number of Addenda: 0

## 2023-10-31 NOTE — Discharge Instructions (Signed)
You are being discharged to home.  °Resume your previous diet.  °We are waiting for your pathology results.  °Continue your present medications.  °Your physician has recommended a repeat colonoscopy for surveillance based on pathology results.  °

## 2023-10-31 NOTE — Transfer of Care (Signed)
 Immediate Anesthesia Transfer of Care Note  Patient: [name]  Procedure(s) Performed: COLONOSCOPY EGD (ESOPHAGOGASTRODUODENOSCOPY)  Patient Location: Short Stay  Anesthesia Type:General  Level of Consciousness: awake and patient cooperative  Airway & Oxygen  Therapy: Patient Spontanous Breathing and Patient connected to nasal cannula oxygen   Post-op Assessment: Report given to RN and Post -op Vital signs reviewed and stable  Post vital signs: Reviewed and stable  Last Vitals:  Vitals Value Taken Time  BP 87/44 10/31/23  1052  Temp 97.7 10/31/23  1052  Pulse 70 10/31/23  1052  Resp 17 10/31/23  1052  SpO2 100 10/31/23  1052    Last Pain:  Vitals:   10/31/23 0959  PainSc: 0-No pain         Complications: No notable events documented.

## 2023-10-31 NOTE — Anesthesia Procedure Notes (Signed)
 Date/Time: 10/31/2023 9:56 AM  Performed by: Barbarann Verneita RAMAN, CRNAPre-anesthesia Checklist: Patient identified, Emergency Drugs available, Suction available, Timeout performed and Patient being monitored Patient Re-evaluated:Patient Re-evaluated prior to induction Oxygen  Delivery Method: Nasal Cannula

## 2023-11-01 ENCOUNTER — Encounter (HOSPITAL_COMMUNITY): Payer: Self-pay | Admitting: Gastroenterology

## 2023-11-01 LAB — GENECONNECT MOLECULAR SCREEN: Genetic Analysis Overall Interpretation: NEGATIVE

## 2023-11-02 NOTE — Anesthesia Postprocedure Evaluation (Signed)
 Anesthesia Post Note  Patient: TASNIM BALENTINE  Procedure(s) Performed: COLONOSCOPY EGD (ESOPHAGOGASTRODUODENOSCOPY)  Patient location during evaluation: Phase II Anesthesia Type: General Level of consciousness: awake Pain management: pain level controlled Vital Signs Assessment: post-procedure vital signs reviewed and stable Respiratory status: spontaneous breathing and respiratory function stable Cardiovascular status: blood pressure returned to baseline and stable Postop Assessment: no headache and no apparent nausea or vomiting Anesthetic complications: no Comments: Late entry   No notable events documented.   Last Vitals:  Vitals:   10/31/23 1049 10/31/23 1103  BP: (!) 87/44 (!) 102/54  Pulse: 70   Resp: 16   Temp: 36.5 C   SpO2:      Last Pain:  Vitals:   11/01/23 1416  TempSrc:   PainSc: 0-No pain                 Yvonna JINNY Bosworth

## 2023-11-04 ENCOUNTER — Ambulatory Visit (INDEPENDENT_AMBULATORY_CARE_PROVIDER_SITE_OTHER): Payer: Self-pay | Admitting: Gastroenterology

## 2023-11-04 LAB — SURGICAL PATHOLOGY

## 2023-11-05 NOTE — Progress Notes (Unsigned)
 @Patient  ID: [name], female    DOB: 03-Nov-1965     MRN: [id number]    Referring provider: [name], MD  Brief patient profile:  28   yowf  Quit smoking 2009  previously  followed for COPD GOLD II (but really more of an AB /eosinophilic phenotype with pfts nearly nl p saba), dx of chronic hypoxic respiratory failure on oxygen  at 4 L and chronic rhinitis Medical history significant for GERD  Tension Pneumothorax 12/2001 s/p VATS w/ mechanical pleurodesis on right -Dr. Obadiah    TEST/EVENTS :  IgE 05/27/2018   3590 with Eosinophil count 800/mcL > Iva   07/29/2019 Follow-up : COPD, oxygen  dependent respiratory failure, asthma, chronic rhinitis Patient returns for a 52-month follow-up.  Patient has underlying moderate COPD with allergic asthma (eosinophilic phenotype).  Last visit she continued to have increased symptom burden.  She was continued on DuoNeb 4 times daily.  Budesonide  twice daily.  Last visit she was started on prednisone  10 mg daily .  She says she does feel that this has helped with her breathing.  She still gets very winded with minimal activity.  Patient had PFTs show stable lung function since 2019.  Patient has moderate airflow obstruction and restriction with FEV1 at 62%, ratio 66, FVC 74%, significant bronchodilator response with positive reversibility, 27% change, significant mid flow reversibility (137% change), DLCO improved at 65%. Patient remains on oxygen  at 4 L.  Previous chest x-ray November 2020 showed clear lungs.  He denies any increased cough congestion or wheezing.  Patient is on disability which was approved and March 2021 unfortunately she does not have insurance.  She can afford her current nebulizer regimen. Is her medications through the good Rx rec Continue on Duoneb Four times a day   Continue on Budesonide  Neb Twice daily   Continue on Prednisone  10mg  daily  Low sweet and salt diet  Activity as tolerated.  Continue on Oxygen   on 4l/m    11/17/2019  Consultaton as 2nd opinion/[place] office/Macyn Remmert re: AB/ 02 dep resp failure  Chief Complaint  Patient presents with   Consult    Shortness of breath with exertion,   Dyspnea:  Doesn't do grocery shopping x sev years  [place] walked [place] before got  Hot on 4lpm and sats 93% she reports while walking  Cough: esp in am / gray  Sleeping: ativan  at hs / on side bed is flat/ 2 pillows / 3 x weekly wakes up feeling need for more saba SABA use: all the time  02: 4lpm 24/7 though POC at 4 pulsed outside home  Prednisone  since jan 2021 but out x sev weeks and much worse even on bud neb rec Make sure you check your oxygen  saturations at highest level of activity to be sure it stays over 90% and adjust upward to maintain this level if needed but remember to turn it back to previous settings when you stop (to conserve your supply).  Stop budesonide  and duoneb  Plan A = Automatic = Always=    Stiolto 2 puffs first each am  Prednisone  10 mg 2 until better then 1 daily  Singulair  10 mg each pm  Plan B = Backup (to supplement plan A, not to replace it) Only use your albuterol  inhaler as a rescue medication  Plan C = Crisis (instead of Plan B but only if Plan B stops working) - only use your albuterol  nebulizer if you first try Plan  B and it fails to help > ok to use the nebulizer up to every 4 hours but if start needing it regularly call for immediate appointment   01/13/2020  f/u ov/[place] office/Yani Coventry re: AB / stiolto and pred 10 mg daily  Chief Complaint  Patient presents with   Follow-up    shortness of breath with activity  Dyspnea: limited by R > L hip not sob Cough: minimal > mucoid Sleeping: bed is flat several pillows  SABA use: less ventolin  but less active / rarely neb  02: 4lpm 24/7  Likes stiolto but can't afford on her insurance and has also tried Theme park manager trelegy and breztri  due to either cost or upper airway side effects rec When  you get medicare, strongly advise you to get medicare with commercial supplement  Plan A = Automatic = Always=   General  symbicort  80 Take 2 puffs first thing in am and then another 2 puffs about 12 hours later.  (or stiolto 2 puffs each am but not both)  Work on inhaler technique: Ok to go back to duoneb and pulmicort   like you were before as your maintenance  Plan B = Backup (to supplement plan A, not to replace it) Only use your albuterol  inhaler as a rescue medication to be used if you can't catch your breath by resting or doing a relaxed purse lip breathing pattern.  - The less you use it, the better it will work when you need it. - Ok to use the inhaler up to 2 puffs  every 4 hours if you must but call for appointment if use goes up over your usual need - Don't leave home without it !!  (think of it like the spare tire for your car)  Plan C = Crisis (instead of Plan B but only if Plan B stops working) - only use your albuterol  nebulizer if you first try Plan B and it fails to help > ok to use the nebulizer up to every 4 hours but if start needing it regularly call for immediate appointment Plan D = Deltasone  If doing worse despite ABC then try taking prednisone  2 until better then hold at 10 mg daily for a week then try 5mg        03/07/2020  f/u ov/[place] office/Zlata Alcaide re: AB stiolto /pred 10 mg one whole, never able to try one half  Chief Complaint  Patient presents with   Follow-up    Patient needs surgical clearance for hip surgery, wears 4 liters oxygen  all the time. Wants samples of stiolto   Dyspnea: stops walking walking 50 ft hip > breathing stops her Cough: none Sleeping: bed is flat, neck pillow  SABA use: hfa sev times a week/ neb less but last 03/05/20  02: 4lpm not checking sats   rec Symbicort  or stiolto daily but not both Work on inhaler technique: When breathing is bad take prednisone  20 mg daily and 10 mg daily otherwise - take all of it first thing in am   Please remember to go to the lab department @ [facility] for your tests - we will call you with the results when they are available.    06/02/2020  f/u ov/[place] office/Alona Danford re: AB stiolto samples plus pred 10 mg daily as can't afford nucala which is what Dr Iva rec  Chief Complaint  Patient presents with   Follow-up    Breathing is overall doing well. She states that her cough has been raspy- prod in  the am with clear to yellow sputum.  She is using her albuterol  inhaler 4-5 x per wk and she has not used neb.   Dyspnea:  Just started back walking p Apr 15 2020 THR  R did fine and planning L side next  Cough: usual am cough  Sleeping: flat bed neck pillow does fine  SABA use: as above  02: 3 lpm 24/7  Covid status: vax x 3  Lung cancer screening: n/a Rec No change on medications  I would Dr Laney office to ask what insurance works the best for [employer] or whatever they recommend you start on  Please schedule a follow up visit in 3 months but call sooner if needed    12/30/2020  f/u ov/[place] office/Nash Bolls re: AB maint on stiolto 2 in am/ fasenra  started 12/26/20 and pred at 10 mg daily   Chief Complaint  Patient presents with   Follow-up    3L O2 all of the time including while sleeping. SOB is mainly with exertion. No cough to report.    Dyspnea:  doing PT / walking treadmill x 1.5 mph x 15 min x flat with sats 85 % but not titrating as rec  Cough: minimal in am > clear mucus Sleeping: bed is flat/ 2 pillow on side does fine SABA use: maybe once a day  02: 3lpm cont and 3lpm POC Covid status: vax x 3  Rec Make sure you check your oxygen  saturation  at your highest level of activity  to be sure it stays over 90% Prednisone  ceiling for now is 20 mg until better, then 10 mg daily is the floor but the floor should be able to be reduced as you respond to fosenra     06/29/2021  f/u ov/[place] office/Ivanell Deshotel re: AB maint on Prednisone  7.5   Dyspnea:  PT  / treadmill going well with 02 sats 93% on 3lpm  Cough: not a big problem Sleeping: flat 2 pillows  SABA use: maybe twice dialy typically use it after ex or cough  02: 3lpm hs dry  Covid status: vax x 3  Lung cancer screening: advised  Nose bleeds from dry 02  Rec I will order humidity be added to your 02  Make sure you check your oxygen  saturation  AT  your highest level of activity (not after you stop)   to be sure it stays over 90%  Ok to try albuterol  15 min  If you are satisfied with your treatment plan,  let your doctor know and he/she can either refill your medications or you can return here when your prescription runs out.    Pulmonary follow up is as needed       06/17/2023  f/u ov/[place] office/Naya Ilagan re: AB/ eosinophilia maint on fasenra /breztri   / singulair  / prednisone  10 mg /florinef per endo Chief Complaint  Patient presents with   New Patient (Initial Visit)    Pt of Dr Jude    Dyspnea:  lost ground  with [employer] shopping stops every other aisle does not check sats  Cough: variable more at hs but min productive scant mucoid  Sleeping: bed is flat/ one bid pillow cough gradually resolves  SABA use: once or twice daily hfa 02: 2lpm sitting/ 3lpm hs / never higher than 3lpm even shopping  Midline chest discomfort worse lying down assoc with bad hb  Rec Protonix  Take 30- 60 min before your first and last meals of the day  Add pepcid  20 mg one hour  before bed  GERD diet reviewed, bed blocks rec  For cough/congestion  > mucinex dm 1200 mg every hours as needed  Make sure you check your oxygen  saturation  AT  your highest level of activity (not after you stop)   to be sure it stays over 90%   Please schedule a follow up visit in 6  months but call sooner if needed     11/08/2023  f/u ov/[place] office/Grettel Rames re: AB/ eosinophilia maint on fosenra  Breztri  / singulair  / prednisone  10 mg floor ceiling / florinef per endocrinology  Changed GERD  rx per GI  Chief  Complaint  Patient presents with   Follow-up    CXR 5/29   Asthma  Dyspnea:  shopping ok  p HC parking / [employer] slow speed/ walking with cane  Cough: min dry and daytime  Sleeping: flat bed / 2 pillows    resp cc  SABA use: xopenex  bid  02: 3lpm concentrator / rest  3 and walking up to 4lpmPOC if lots aisles   Lung cancer screening: in program for q May  Overt HB / seeing GI on voquezna  now  No obvious day to day or daytime variability or assoc excess/ purulent sputum or mucus plugs or hemoptysis or cp or chest tightness, subjective wheeze or overt sinus   symptoms.    Also denies any obvious fluctuation of symptoms with weather or environmental changes or other aggravating or alleviating factors except as outlined above   No unusual exposure hx or h/o childhood pna/ asthma or knowledge of premature birth.  Current Allergies, Complete Past Medical History, Past Surgical History, Family History, and Social History were reviewed in [employer] record.  ROS  The following are not active complaints unless bolded Hoarseness, sore throat, dysphagia, dental problems, itching, sneezing,  nasal congestion or discharge of excess mucus or purulent secretions, ear ache,   fever, chills, sweats, unintended wt loss or wt gain, classically pleuritic or exertional cp,  orthopnea pnd or arm/hand swelling  or leg swelling, presyncope, palpitations, abdominal pain, anorexia, nausea, vomiting, diarrhea  or change in bowel habits or change in bladder habits, change in stools or change in urine, dysuria, hematuria,  rash, arthralgias, visual complaints, headache, numbness, weakness or ataxia or problems with walking or coordination,  change in mood or  memory.        Current Meds  Medication Sig   atorvastatin (LIPITOR) 10 MG tablet Take 1 tablet by mouth daily.   benralizumab  (FASENRA  PEN) 30 MG/ML prefilled autoinjector Inject 1 mL (30 mg total) into the skin every 8 (eight) weeks.    budesonide -glycopyrrolate -formoterol  (BREZTRI  AEROSPHERE) 160-9-4.8 MCG/ACT AERO inhaler Inhale 2 puffs into the lungs in the morning and at bedtime.   Cariprazine HCl (VRAYLAR PO) Take 1 capsule by mouth daily.   cetirizine (ZYRTEC) 10 MG tablet Take 10 mg by mouth daily.   Cholecalciferol (VITAMIN D3 SUPER STRENGTH PO) Take 5,000 Units by mouth daily.   citalopram  (CELEXA ) 20 MG tablet Take 20 mg by mouth daily.   escitalopram (LEXAPRO) 10 MG tablet Take 1 tablet every day by oral route.   FEROSUL 325 (65 Fe) MG tablet Take 325 mg by mouth daily.   fludrocortisone (FLORINEF) 0.1 MG tablet Take 0.2 mg by mouth daily.   Lancets (ONETOUCH DELICA PLUS LANCET33G) MISC Apply topically.   latanoprost (XALATAN) 0.005 % ophthalmic solution Place 1 drop into both eyes at bedtime.   levalbuterol  (XOPENEX  HFA) 45 MCG/ACT inhaler Inhale  into the lungs every 4 (four) hours as needed for wheezing.   montelukast  (SINGULAIR ) 10 MG tablet TAKE 1 TABLET BY MOUTH AT BEDTIME   Multiple Vitamin (MULTIVITAMIN WITH MINERALS) TABS tablet Take 1 tablet by mouth daily.   ONETOUCH VERIO test strip SMARTSIG:1 Strip(s) Via Meter Twice Daily PRN   pantoprazole  (PROTONIX ) 40 MG tablet Take 30- 60 min before your first and last meals of the day   predniSONE  (DELTASONE ) 5 MG tablet Take 2 tablets (10 mg total) by mouth daily with breakfast.   pregabalin  (LYRICA ) 75 MG capsule Take 75 mg by mouth 2 (two) times daily.   Spacer/Aero-Holding Chambers DEVI 1 each by Does not apply route daily as needed.   SUMAtriptan  6 MG/0.5ML SOAJ Inject 6 mg as directed daily as needed (migraine).    TIROSINT  100 MCG CAPS Take 1 capsule (100 mcg total) by mouth daily before breakfast.   Triamcinolone  Acetonide (NASACORT  ALLERGY  24HR NA) Place 2 sprays into the nose daily as needed (allergies).   valACYclovir  (VALTREX ) 500 MG tablet Take 500-2,000 mg by mouth See admin instructions. Take 2000mg s at first sign of fever blister outbreak then  take 500mg s daily until gone   Vonoprazan Fumarate  (VOQUEZNA ) 10 MG TABS Take 1 each by mouth daily. Start taking after finishing 8 weeks of 20 mg dosing   Vonoprazan Fumarate  (VOQUEZNA ) 20 MG TABS Take 1 each by mouth daily.   XOPENEX  HFA 45 MCG/ACT inhaler INHALE TWO PUFFS BY MOUTH INTO LUNGS EVERY 6 HOURS AS NEEDED FOR WHEEZING                 Past Medical History:  Diagnosis Date   Anxiety    Asthma    Cervical radiculopathy    Cluster headaches    COPD (chronic obstructive pulmonary disease) (HCC)    COPD (chronic obstructive pulmonary disease) with chronic bronchitis (HCC)    GERD (gastroesophageal reflux disease)    Hyperlipemia    Hypoglycemia    Hypothyroidism    Migraine    Pneumothorax    Rectal discharge 07/28/2010   Shortness of breath    Sleep apnea    cannot tolerate-not using CPAP   Sluggishness 07/28/2010        Physical Exam  Wts  11/08/2023         138  06/17/2023       147   06/29/2021       148  12/30/2020       148  06/02/2020       149  03/07/2020     144  01/13/2020       145   11/17/19 146 lb 12.8 oz (66.6 kg)  09/15/19 149 lb 6.4 oz (67.8 kg)  07/29/19 151 lb (68.5 kg)    Vital signs reviewed  11/08/2023  - Note at rest 02 sats  98% on 3lpm poc  General appearance:    amb pleasant wf nad    HEENT : Oropharynx  clear   Nasal turbinates nl    NECK :  without  apparent JVD/ palpable Nodes/TM    LUNGS: no acc muscle use,  Min barrel  contour chest wall with bilateral  slightly decreased bs s audible wheeze and  without cough on insp or exp maneuvers and min  Hyperresonant  to  percussion bilaterally    CV:  RRR  no s3 or murmur or increase in P2, and no edema   ABD:  soft and nontender with pos end  insp Hoover's  in the supine position.  No bruits or organomegaly appreciated   MS:  Nl gait/ ext warm without deformities Or obvious joint restrictions  calf tenderness, cyanosis or clubbing     SKIN: warm and dry without lesions     NEURO:  alert, approp, nl sensorium with  no motor or cerebellar deficits apparent.            Assessment & Plan:

## 2023-11-08 ENCOUNTER — Telehealth: Payer: Self-pay | Admitting: Allergy & Immunology

## 2023-11-08 ENCOUNTER — Encounter: Payer: Self-pay | Admitting: Internal Medicine

## 2023-11-08 ENCOUNTER — Ambulatory Visit: Admitting: Internal Medicine

## 2023-11-08 VITALS — BP 134/80 | HR 76 | Ht 64.0 in | Wt 138.0 lb

## 2023-11-08 DIAGNOSIS — Z87891 Personal history of nicotine dependence: Secondary | ICD-10-CM | POA: Diagnosis not present

## 2023-11-08 DIAGNOSIS — J9611 Chronic respiratory failure with hypoxia: Secondary | ICD-10-CM | POA: Diagnosis not present

## 2023-11-08 DIAGNOSIS — J4489 Other specified chronic obstructive pulmonary disease: Secondary | ICD-10-CM | POA: Diagnosis not present

## 2023-11-08 NOTE — Assessment & Plan Note (Signed)
 02 dep since 2020 per V[place]   11/17/2019   Walked 4lpm her POC  approx   300 ft  @ slow/[place]  stopped due to  Sob and R hip and leg pain with sats still 98%     - 06/17/2023   Walked on 3lpm Pulse   x  1  lap(s) =  approx 150  ft  @ slow pace   stopped due to doe and having hip pain, she was also using a cane for assisitance   with lowest 02 sats 94%   Ok to continue 3lpm hs and up to 4lpm POC with activity but monitor occasionally to veriy ex sast staying > 90%   Discussed in detail all the  indications, usual  risks and alternatives  relative to the benefits with patient who agrees to proceed with Rx as outlined.      F/u  q 12 m - call sooner prn          Each maintenance medication was reviewed in detail including emphasizing most importantly the difference between maintenance and prns and under what circumstances the prns are to be triggered using an action plan format where appropriate.  Total time for H and P, chart review, counseling, reviewing hfa/02/pulse ox  device(s) and generating customized AVS unique to this office visit / same day charting = 31 min

## 2023-11-08 NOTE — Progress Notes (Signed)
 10 yr TCS noted in recall Patient result letter mailed procedure note and pathology result faxed to PCP

## 2023-11-08 NOTE — Patient Instructions (Addendum)
 Ms.Also  Ok to try albuterol  15 min before an activity (on alternating days)  that you know would usually make you short of breath and see if it makes any difference and if makes none then don't take albuterol  after activity unless you can't catch your breath as this means it's the resting that helps, not the albuterol .   Please schedule a follow up visit in 12  months but call sooner if needed

## 2023-11-08 NOTE — Assessment & Plan Note (Signed)
 Quit smoking 2009  / steroid dep since Jan 2021  - IgE 05/27/2018   3590 with Eosinophil count 800/mcL > [place]  - PFT's 07/29/2019  FEV1 2.25 (79 % ) ratio 0.70  p 27 % improvement from saba p 0 prior to study with DLCO  14.30 (65%) corrects to 3.87 (91%)  for alv volume and FV curve min curvature   - 11/17/2019    try stiolto x 6 weeks and pred 20  Until better then 10 mg daily plus maint singulair   - Labs ordered 03/07/2020   alpha one AT phenotype  MM - 03/06/20 IgE  1115  - 12/26/20 started Fasenra  -12/30/2020  After extensive coaching inhaler device,  effectiveness =    90% with smi > continue stiolto with pred ceiling 20 and floor 10  - 06/29/2021 maint on performist/bud per DR Iva, pulmonary f/u is prn - 06/17/2023 maint on breztri / fasenra   - Spirometry 06/17/2023  FEV1 1.68 (67%)  Ratio 0.76   - 06/17/2023  After extensive coaching inhaler device,  effectiveness =    80% so continue breztri  and approp saba   Gold 2 copd bs mod persistent asthma > stabilized on fosenra/singulair / breztri  but still using too much saba so reminded  Re SABA :  I spent extra time with pt today reviewing appropriate use of albuterol  for prn use on exertion with the following points: 1) saba is for relief of sob that does not improve by walking a slower pace or resting but rather if the pt does not improve after trying this first. 2) If the pt is convinced, as many are, that saba helps recover from activity faster then it's easy to tell if this is the case by re-challenging : ie stop, take the inhaler, then p 5 minutes try the exact same activity (intensity of workload) that just caused the symptoms and see if they are substantially diminished or not after saba 3) if there is an activity that reproducibly causes the symptoms, try the saba 15 min before the activity on alternate days   If in fact the saba really does help, then fine to continue to use it prn but advised may need to look closer at the  maintenance regimen being used to achieve better control of airways disease with exertion.

## 2023-11-08 NOTE — Telephone Encounter (Signed)
 PT called to advise she has updated her pharmacy for Singulair  to [facility], provided following information: RX Line: [phone] NCPD/E-scribe: [id number] Fax #: [phone]  Pt also had question about Breztri , as that did not see to get delivered to Phx (or the correct Phx maybe?). Can call back if further info needed

## 2023-11-11 ENCOUNTER — Other Ambulatory Visit: Payer: Self-pay

## 2023-11-11 ENCOUNTER — Other Ambulatory Visit (HOSPITAL_COMMUNITY): Payer: Self-pay

## 2023-11-11 ENCOUNTER — Encounter (INDEPENDENT_AMBULATORY_CARE_PROVIDER_SITE_OTHER): Payer: Self-pay

## 2023-11-11 ENCOUNTER — Other Ambulatory Visit: Payer: Self-pay | Admitting: Pharmacy Technician

## 2023-11-11 DIAGNOSIS — T380X5A Adverse effect of glucocorticoids and synthetic analogues, initial encounter: Secondary | ICD-10-CM

## 2023-11-11 DIAGNOSIS — E039 Hypothyroidism, unspecified: Secondary | ICD-10-CM

## 2023-11-11 MED ORDER — TIROSINT 100 MCG PO CAPS: 100.0000 ug | ORAL_CAPSULE | Freq: Every day | ORAL | 1 refills | Status: DC

## 2023-11-11 MED ORDER — MONTELUKAST SODIUM 10 MG PO TABS: 10.0000 mg | ORAL_TABLET | Freq: Every day | ORAL | 1 refills | Status: AC

## 2023-11-11 MED ORDER — PREDNISONE 5 MG PO TABS
5.0000 mg | ORAL_TABLET | Freq: Every day | ORAL | 1 refills | Status: DC
Start: 2023-11-11 — End: 2023-12-03

## 2023-11-11 NOTE — Progress Notes (Signed)
 Specialty Pharmacy Refill Coordination Note  [name] is a 58 y.o. female contacted today regarding refills of specialty medication(s)   Benralizumab  (Fasenra  Pen)    Patient requested (Patient-Rptd) Delivery   Delivery date: 11/26/23 Verified address: (Patient-Rptd) [address], [place], [place] [address]   Medication will be filled on 11/25/23.

## 2023-11-11 NOTE — Telephone Encounter (Signed)
 Called and informed patient that the singular has now been sent to [facility]. I did advise her to contact [employer] about her medication mail outs. Patient verbalized understanding and agreed to do so.

## 2023-11-12 NOTE — Telephone Encounter (Signed)
Thanks, [place]!   Salvatore Marvel, MD [facility] [place] [facility] [place]

## 2023-11-13 ENCOUNTER — Other Ambulatory Visit: Payer: Self-pay

## 2023-11-13 ENCOUNTER — Other Ambulatory Visit (HOSPITAL_COMMUNITY): Payer: Self-pay

## 2023-11-13 ENCOUNTER — Telehealth: Payer: Self-pay

## 2023-11-13 NOTE — Telephone Encounter (Signed)
*  AA  Pharmacy Patient Advocate Encounter   Received notification from CoverMyMeds that prior authorization for Levalbuterol  Tartrate 45MCG/ACT aerosol  is required/requested.   Insurance verification completed.   The patient is insured through [facility] .   Per test claim:  Brand Xopenex  HFA is preferred by the insurance.  If suggested medication is appropriate, Please send in a new RX and discontinue this one. If not, please advise as to why it's not appropriate so that we may request a Prior Authorization. Please note, some preferred medications may still require a PA.  If the suggested medications have not been trialed and there are no contraindications to their use, the PA will not be submitted, as it will not be approved.   Brand Xopenex  HFA- $0.00

## 2023-11-13 NOTE — Progress Notes (Signed)
 Specialty Pharmacy Ongoing Clinical Assessment Note  [name] is a 58 y.o. female who is being followed by the specialty pharmacy service for RxSp Asthma/COPD   Patient's specialty medication(s) reviewed today: Benralizumab  (Fasenra  Pen)   Missed doses in the last 4 weeks: 0   Patient/Caregiver did not have any additional questions or concerns.   Therapeutic benefit summary: Patient is achieving benefit   Adverse events/side effects summary: No adverse events/side effects   Patient's therapy is appropriate to: Continue    Goals Addressed             This Visit's Progress    Reduce disease symptoms including coughing and shortness of breath   On track    Patient is on track. Patient will maintain adherence. Patient reported no recent flare ups.         Follow up: 12 months  Clinical Intervention Note  Clinical Intervention Notes: Patient reported starting candesartan and Voquenza. No DDIs identified with Fasenra    Clinical Intervention Outcomes: Prevention of an adverse drug event   Advertising account planner

## 2023-11-25 ENCOUNTER — Other Ambulatory Visit: Payer: Self-pay

## 2023-11-27 LAB — T3, FREE: T3, Free: 3.1 pg/mL (ref 2.0–4.4)

## 2023-11-27 LAB — TSH: TSH: 0.075 u[IU]/mL — ABNORMAL LOW (ref 0.450–4.500)

## 2023-11-27 LAB — T4, FREE: Free T4: 1.91 ng/dL — ABNORMAL HIGH (ref 0.82–1.77)

## 2023-12-03 ENCOUNTER — Ambulatory Visit (INDEPENDENT_AMBULATORY_CARE_PROVIDER_SITE_OTHER): Admitting: "Endocrinology

## 2023-12-03 ENCOUNTER — Encounter: Payer: Self-pay | Admitting: "Endocrinology

## 2023-12-03 VITALS — BP 106/70 | HR 84 | Ht 64.0 in | Wt 137.4 lb

## 2023-12-03 DIAGNOSIS — E039 Hypothyroidism, unspecified: Secondary | ICD-10-CM | POA: Diagnosis not present

## 2023-12-03 DIAGNOSIS — E2740 Unspecified adrenocortical insufficiency: Secondary | ICD-10-CM | POA: Diagnosis not present

## 2023-12-03 DIAGNOSIS — R7303 Prediabetes: Secondary | ICD-10-CM | POA: Diagnosis not present

## 2023-12-03 DIAGNOSIS — E782 Mixed hyperlipidemia: Secondary | ICD-10-CM | POA: Diagnosis not present

## 2023-12-03 MED ORDER — PREDNISONE 10 MG PO TABS: 10.0000 mg | ORAL_TABLET | Freq: Every day | ORAL | 1 refills | Status: DC

## 2023-12-03 MED ORDER — TIROSINT 88 MCG PO CAPS: 88.0000 ug | ORAL_CAPSULE | Freq: Every day | ORAL | 1 refills | Status: DC

## 2023-12-03 NOTE — Progress Notes (Signed)
 12/03/2023, 1:42 PM   Endocrinology follow-up note  Subjective:    Patient ID: [name], female    DOB: 05/22/1965, PCP [name], MD   Past Medical History:  Diagnosis Date   Adrenal insufficiency (HCC)    Anemia    Anxiety    Arthritis    Asthma    Balance disorder    Cervical radiculopathy    Chronic kidney disease    Stage 3 Chronic Kidney Disease   Cluster headaches    COPD (chronic obstructive pulmonary disease) (HCC)    COPD (chronic obstructive pulmonary disease) with chronic bronchitis (HCC)    GERD (gastroesophageal reflux disease)    Hyperlipemia    Hypoglycemia    Hypothyroidism    Migraine    Oxygen  dependent    3L continuous   Pneumothorax    PONV (postoperative nausea and vomiting)    Pre-diabetes    Rectal discharge 07/28/2010   Shortness of breath    Sleep apnea    cannot tolerate-not using CPAP   Sluggishness 07/28/2010   Past Surgical History:  Procedure Laterality Date   ABDOMINAL HYSTERECTOMY     with bladder suspension   CERVICAL FUSION     CHOLECYSTECTOMY     COLONOSCOPY N/A 07/30/2013   Procedure: COLONOSCOPY;  Surgeon: Claudis RAYMOND Rivet, MD;  [facility];  Service: Endoscopy;  Laterality: N/A;  930   COLONOSCOPY N/A 10/31/2023   Procedure: COLONOSCOPY;  Surgeon: Eartha Angelia Sieving, MD;  Location: [facility];  Service: Gastroenterology;  Laterality: N/A;  9:45 am, asa 3   ESOPHAGOGASTRODUODENOSCOPY N/A 10/31/2023   Procedure: EGD (ESOPHAGOGASTRODUODENOSCOPY);  Surgeon: Eartha Angelia, Sieving, MD;  [facility]: [facility];  Service: Gastroenterology;  Laterality: N/A;   EXAM UNDER ANESTHESIA WITH MANIPULATION OF SHOULDER Right 10/11/2016   Procedure: EXAM UNDER ANESTHESIA WITH MANIPULATION OF SHOULDER;  Surgeon: Margrette Taft BRAVO, MD;  Location: [facility];  Service: Orthopedics;  Laterality: Right;   HERNIA REPAIR     INCISIONAL HERNIA REPAIR N/A  07/30/2016   Procedure: HERNIA REPAIR INCISIONAL WITH MESH;  Surgeon: Oneil Budge, MD;  Location: [facility];  Service: General;  Laterality: N/A;   INTERSTIM IMPLANT PLACEMENT  02/02/2021   Procedure: RENNA IMPLANT FIRST STAGE LEFT SACRUM ;  Surgeon: Sherrilee Belvie CROME, MD;  Location: [facility];  Service: Urology;;   RENNA IMPLANT PLACEMENT N/A 02/23/2021   Procedure: RENNA IMPLANT SECOND STAGE;  Surgeon: Sherrilee Belvie CROME, MD;  Location: [facility];  Service: Urology;  Laterality: N/A;  Rep coming, time needs to stay at 2:00   INTERSTIM IMPLANT REVISION N/A 10/25/2022   Procedure: REVISION OF RENNA;  Surgeon: Sherrilee Belvie CROME, MD;  Location: [facility];  Service: Urology;  Laterality: N/A;   JOINT REPLACEMENT     LOBECTOMY     right lung    POSTERIOR CERVICAL LAMINECTOMY Right 04/05/2017   Procedure: Right C6-7 Posterior cervical laminectomy;  Surgeon: Onetha Kuba, MD;  Location: [facility];  Service: Neurosurgery;  Laterality: Right;  Right C6-7 Posterior cervical laminectomy   SHOULDER ARTHROSCOPY WITH ROTATOR CUFF REPAIR Right 10/11/2016   Procedure: SHOULDER ARTHROSCOPY;  Surgeon: Margrette Taft BRAVO, MD;  Location: [facility];  Service: Orthopedics;  Laterality: Right;   TOTAL HIP ARTHROPLASTY Right 04/15/2020   Procedure: RIGHT TOTAL HIP ARTHROPLASTY ANTERIOR APPROACH;  Surgeon: Vernetta Lonni GRADE, MD;  Location: [facility];  Service: Orthopedics;  Laterality: Right;   TOTAL HIP ARTHROPLASTY Left 07/22/2020   Procedure: LEFT  HIP ARTHROPLASTY ANTERIOR APPROACH;  Surgeon: Vernetta Lonni GRADE, MD;  Location: [facility];  Service: Orthopedics;  Laterality: Left;  RNFA   TUBAL LIGATION     Social History   Socioeconomic History   Marital status: Married    Spouse name: Not on file   Number of children: Not on file   Years of education: Not on file   Highest education level: Not on file  Occupational History   Not on file  Tobacco Use   Smoking status: Former    Current packs/day:  0.00    Average packs/day: 1 pack/day for 30.0 years (30.0 ttl pk-yrs)    Types: Cigarettes    Start date: 02/20/1978    Quit date: 02/21/2008    Years since quitting: 15.7   Smokeless tobacco: Never  Vaping Use   Vaping status: Never Used  Substance and Sexual Activity   Alcohol use: No    Alcohol/week: 0.0 standard drinks of alcohol    Comment: no   Drug use: No   Sexual activity: Not Currently    Partners: Male    Birth control/protection: Surgical  Other Topics Concern   Not on file  Social History Narrative   Are you right handed or left handed? Right handed   Are you currently employed ?    What is your current occupation?   Do you live at home alone? NO    Who lives with you? Son   What type of home do you live in: 1 story or 2 story? 1       Social Drivers of Community education officer: Not on file  Food Insecurity: Not on file  Transportation Needs: Not on file  Physical Activity: Not on file  Stress: Not on file  Social Connections: Not on file   Family History  Problem Relation Age of Onset   Neuropathy Mother    Migraines Mother    Cancer Mother    COPD Mother    Diabetes Mother    Hyperlipidemia Mother    Hypertension Mother    Hyperlipidemia Father    Hypertension Father    Migraines Sister    Migraines Maternal Aunt    Stroke Maternal Grandmother    Heart failure Maternal Grandmother    Hypertension Maternal Grandmother    Thyroid  disease Maternal Grandmother    Diabetes Paternal Grandmother    Alzheimer's disease Paternal Grandmother    Stroke Paternal Grandfather    Heart failure Paternal Grandfather    Hypertension Paternal Grandfather    Bipolar disorder Son    Colon cancer Neg Hx    Outpatient Encounter Medications as of 12/03/2023  Medication Sig   TIROSINT  88 MCG CAPS Take 1 capsule (88 mcg total) by mouth daily before breakfast.   [DISCONTINUED] predniSONE  (DELTASONE ) 5 MG tablet Take 1 tablet (5 mg total) by mouth daily  with breakfast. (Patient taking differently: Take 10 mg by mouth daily with breakfast.)   atorvastatin (LIPITOR) 10 MG tablet Take 1 tablet by mouth daily.   benralizumab  (FASENRA  PEN) 30 MG/ML prefilled autoinjector Inject 1 mL (30 mg total) into the skin every 8 (eight) weeks.   budesonide -glycopyrrolate -formoterol  (BREZTRI  AEROSPHERE) 160-9-4.8 MCG/ACT AERO inhaler Inhale 2  puffs into the lungs in the morning and at bedtime.   Cariprazine HCl (VRAYLAR PO) Take 1 capsule by mouth daily.   cetirizine (ZYRTEC) 10 MG tablet Take 10 mg by mouth daily.   Cholecalciferol (VITAMIN D3 SUPER STRENGTH PO) Take 5,000 Units by mouth daily.   citalopram  (CELEXA ) 20 MG tablet Take 20 mg by mouth daily.   escitalopram (LEXAPRO) 10 MG tablet Take 1 tablet every day by oral route.   FEROSUL 325 (65 Fe) MG tablet Take 325 mg by mouth daily.   fludrocortisone (FLORINEF) 0.1 MG tablet Take 0.1 mg by mouth daily.   Lancets (ONETOUCH DELICA PLUS LANCET33G) MISC Apply topically.   latanoprost (XALATAN) 0.005 % ophthalmic solution Place 1 drop into both eyes at bedtime.   levalbuterol  (XOPENEX  HFA) 45 MCG/ACT inhaler Inhale into the lungs every 4 (four) hours as needed for wheezing.   montelukast  (SINGULAIR ) 10 MG tablet Take 1 tablet (10 mg total) by mouth at bedtime.   Multiple Vitamin (MULTIVITAMIN WITH MINERALS) TABS tablet Take 1 tablet by mouth daily.   ONETOUCH VERIO test strip SMARTSIG:1 Strip(s) Via Meter Twice Daily PRN   pantoprazole  (PROTONIX ) 40 MG tablet Take 30- 60 min before your first and last meals of the day   predniSONE  (DELTASONE ) 10 MG tablet Take 1 tablet (10 mg total) by mouth daily with breakfast.   pregabalin  (LYRICA ) 75 MG capsule Take 75 mg by mouth 2 (two) times daily.   Spacer/Aero-Holding Chambers DEVI 1 each by Does not apply route daily as needed.   SUMAtriptan  6 MG/0.5ML SOAJ Inject 6 mg as directed daily as needed (migraine).    Triamcinolone  Acetonide (NASACORT  ALLERGY  24HR NA)  Place 2 sprays into the nose daily as needed (allergies).   valACYclovir  (VALTREX ) 500 MG tablet Take 500-2,000 mg by mouth See admin instructions. Take 2000mg s at first sign of fever blister outbreak then take 500mg s daily until gone   Vonoprazan Fumarate  (VOQUEZNA ) 10 MG TABS Take 1 each by mouth daily. Start taking after finishing 8 weeks of 20 mg dosing   Vonoprazan Fumarate  (VOQUEZNA ) 20 MG TABS Take 1 each by mouth daily.   XOPENEX  HFA 45 MCG/ACT inhaler INHALE TWO PUFFS BY MOUTH INTO LUNGS EVERY 6 HOURS AS NEEDED FOR WHEEZING   [DISCONTINUED] TIROSINT  100 MCG CAPS Take 1 capsule (100 mcg total) by mouth daily before breakfast.   No facility-administered encounter medications on file as of 12/03/2023.   ALLERGIES: Allergies  Allergen Reactions   Aspirin  Shortness Of Breath    Causes asthma flares    Penicillins Other (See Comments)    UNSPECIFIED REACTION OF CHILDHOOD  Has patient had a PCN reaction causing immediate rash, facial/tongue/throat swelling, SOB or lightheadedness with hypotension: NO Has patient had a PCN reaction causing severe rash involving mucus membranes or skin necrosis: NO Has patient had a PCN reaction that required hospitalization: no Has patient had a PCN reaction occurring within the last 10 years: NO If all of the above answers are NO, then may proceed with Cephalosporin use.    Vicodin [Hydrocodone -Acetaminophen ] Other (See Comments)    Headaches     VACCINATION STATUS: Immunization History  Administered Date(s) Administered   Influenza,inj,Quad PF,6+ Mos 04/13/2021   Influenza,inj,Quad PF,6-35 Mos 01/28/2020   Influenza,inj,quad, With Preservative 02/07/2022   Influenza,trivalent, recombinat, inj, PF 12/12/2022   Influenza-Unspecified 02/15/2017, 04/13/2019, 04/13/2021   PFIZER(Purple Top)SARS-COV-2 Vaccination 06/21/2019, 07/22/2019, 03/09/2020   PNEUMOCOCCAL CONJUGATE-20 04/13/2021   Tdap 08/06/2023   Zoster Recombinant(Shingrix) 08/06/2023  HPI Daisha Filosa Deupree is 58 y.o. female who presents today with a medical history as above. she is being seen in follow-up after she was seen in consultation for steroid induced adrenal suppression.  She also has hypothyroidism due to Hashimoto's thyroiditis.  She is currently responding to Tirosint  treatment 100 mcg with previsit labs showing slight over replacement.   She has a long and complicated history with refractory hypothyroidism when she was taking levothyroxine , Synthroid , and even Tirosint  for most of the last year.  She does not have acute complaints today.  She does not report palpitations, tremors, heat intolerance.  For her adrenal insufficiency, she was on ongoing prednisone  10 mg p.o. daily and fludrocortisone 0.2 mg p.o. daily.    In the interval, she was actually initiated on ARB for blood pressure management.    See notes from her previous visit.  She has been treated with prednisone  more or less continuously for the last 15 years.   She denies any history of adrenal crisis.   She denies diarrhea nor vomiting.  She was offered surveillance CT abdomen/pelvis to rule out consumptive hypothyroidism which returns negative findings. -  She reports fatigue and cold intolerance.  She relates consistency with taking her medications.       She has history of heavy smoking, complicated by COPD on ongoing inhalers including albuterol , budesonide , and Perforomist .  She is also on oxygen  supplement 24/7. She denies any history of adrenal injury, penetrating abdominal trauma.  -  She also has hyperlipidemia and prediabetes.  - Her most recent bone density from March 25, 2019 was consistent with osteopenia of the spine, normal bone density on the left forearm radius.    Review of Systems  Constitutional: + Steady weight,    + fatigue, no subjective hyperthermia, no subjective hypothermia Eyes: no blurry vision, no xerophthalmia  Respiratory: + On continuous oxygen   supplement.   Status post partial pneumonectomy in 2004.   Objective:       12/03/2023    1:02 PM 11/08/2023   10:47 AM 10/31/2023   11:03 AM  Vitals with BMI  Height 5' 4 5' 4   Weight 137 lbs 6 oz 138 lbs   BMI 23.57 23.68   Systolic 106 134 897  Diastolic 70 80 54  Pulse 84 76     BP 106/70   Pulse 84   Ht 5' 4 (1.626 m)   Wt 137 lb 6.4 oz (62.3 kg)   BMI 23.58 kg/m   Wt Readings from Last 3 Encounters:  12/03/23 137 lb 6.4 oz (62.3 kg)  11/08/23 138 lb (62.6 kg)  10/29/23 138 lb 11.2 oz (62.9 kg)    Physical Exam  Constitutional:  Body mass index is 23.58 kg/m.,  not in acute distress, normal state of mind    CMP ( most recent) CMP     Component Value Date/Time   NA 139 10/29/2023 1336   NA 143 10/04/2023 0852   K 3.7 10/29/2023 1336   CL 104 10/29/2023 1336   CO2 26 10/29/2023 1336   GLUCOSE 176 (H) 10/29/2023 1336   BUN 11 10/29/2023 1336   BUN 13 10/04/2023 0852   CREATININE 0.94 10/29/2023 1336   CALCIUM  8.9 10/29/2023 1336   CALCIUM  9.3 05/16/2023 0000   PROT 6.2 10/04/2023 0852   ALBUMIN 4.2 10/04/2023 0852   AST 17 10/04/2023 0852   ALT 19 10/04/2023 0852   ALKPHOS 57 10/04/2023 0852   BILITOT 0.5 10/04/2023 9147  GFRNONAA >60 10/29/2023 1336   GFRAA >60 03/06/2019 0931     Diabetic Labs (most recent): Lab Results  Component Value Date   HGBA1C 5.6 08/27/2023   HGBA1C 5.7 02/08/2023   HGBA1C 5.6 01/24/2023     Lab Results  Component Value Date   TSH 0.075 (L) 11/26/2023   TSH 0.083 (L) 10/29/2023   TSH 0.058 (L) 10/04/2023   TSH 0.086 (L) 09/30/2023   TSH 79.800 (H) 08/20/2023   TSH 97.700 (H) 06/13/2023   TSH 126.00 (A) 05/16/2023   TSH 95.700 (H) 04/17/2023   TSH 117.000 (H) 04/01/2023   TSH 121.000 (H) 02/14/2023   FREET4 1.91 (H) 11/26/2023   FREET4 1.30 (H) 10/29/2023   FREET4 2.50 (H) 10/04/2023   FREET4 3.96 (H) 09/30/2023   FREET4 <0.10 (L) 08/20/2023   FREET4 <0.10 (L) 06/13/2023   FREET4 0.10 05/16/2023    FREET4 0.26 (L) 04/17/2023   FREET4 0.15 (L) 04/01/2023   FREET4 0.10 (L) 02/14/2023     Lipid Panel     Component Value Date/Time   CHOL 163 08/20/2023 0806   TRIG 77 08/20/2023 0806   HDL 81 08/20/2023 0806   CHOLHDL 2.0 08/20/2023 0806   LDLCALC 68 08/20/2023 0806   LABVLDL 14 08/20/2023 0806    CT of abdomen and pelvis without contrast on March 06, 2023 IMPRESSION: 1. No findings to explain the patient's clinical history. 2. Trace pericardial fluid. 3. Punctate right renal stone. 4.  Aortic atherosclerosis (ICD10-I70.0).   Thyroid  ultrasound on April 25, 2023 No discrete nodules are seen within the thyroid  gland.   IMPRESSION: Markedly heterogeneous and small/atrophied thyroid  gland consistent with the clinical history of hypothyroidism and presumed long-standing exogenous thyroid  hormone replacement therapy.  No thyroid  nodules.  Assessment & Plan:   1.  Hypothyroidism -due to Hashimoto's thyroiditis 2.  Steroid induced adrenal suppression  3.  Prediabetes 4.  Hyperlipidemia   - I have reviewed her recent and available endocrine records and clinically evaluated the patient.  Reviewing her recent labs indicate that the cause of her hypothyroidism is Hashimoto's thyroiditis.  She is responding to Tirosint  with a slight over replacement.  I discussed and lowered her Trosyd to 88 mcg p.o. daily before breakfast.     - We discussed about the correct intake of her thyroid  hormone, on empty stomach at fasting, with water , separated by at least 30 minutes from breakfast and other medications,  and separated by more than 4 hours from calcium , iron, multivitamins, acid reflux medications (PPIs). -Patient is made aware of the fact that thyroid  hormone replacement is needed for life, dose to be adjusted by periodic monitoring of thyroid  function tests.  -Her surveillance thyroid /neck ultrasound was unremarkable.   - she has adrenal suppression due to chronic steroid  treatment related to her COPD/asthma.  This patient will likely need lifelong replacement dose steroids, glucocorticoids for now.  She is advised to continue prednisone  currently 10 mg p.o. daily at breakfast.  In light of safety and to avoid overtreatment, I advised her to lower her fludrocortisone to 0.1 mg p.o. daily at breakfast. Her blood pressure is stabilizing at 106/70 mmHg today. -She wears a medical alert for  depicting her adrenal insufficiency and the need for steroids.    Regarding her dyslipidemia: She presents with significant improvement in her lipid panel with LDL at 68 improving from 155.    She is advised to continue atorvastatin 10 mg p.o. nightly.  Side effects and precautions discussed with  her.     - she is advised to maintain close follow up with [name], MD for primary care needs.   I spent  25  minutes in the care of the patient today including review of labs from Thyroid  Function, CMP, and other relevant labs ; imaging/biopsy records (current and previous including abstractions from other facilities); face-to-face time discussing  her lab results and symptoms, medications doses, her options of short and long term treatment based on the latest standards of care / guidelines;   and documenting the encounter.  [name]  participated in the discussions, expressed understanding, and voiced agreement with the above plans.  All questions were answered to her satisfaction. she is encouraged to contact clinic should she have any questions or concerns prior to her return visit.   Follow up plan: Return in about 6 months (around 06/04/2024) for F/U with Pre-visit Labs.   Ranny Earl, MD [facility] [address] [place], [place] [address] Phone: [phone]  Fax: [phone]     12/03/2023, 1:42 PM  This note was partially dictated with voice recognition software. Similar sounding words can be  transcribed inadequately or may not  be corrected upon review.

## 2023-12-04 ENCOUNTER — Other Ambulatory Visit: Payer: Self-pay

## 2023-12-04 DIAGNOSIS — T380X5A Adverse effect of glucocorticoids and synthetic analogues, initial encounter: Secondary | ICD-10-CM

## 2023-12-04 MED ORDER — PREDNISONE 10 MG PO TABS: 10.0000 mg | ORAL_TABLET | Freq: Every day | ORAL | 1 refills | Status: AC

## 2023-12-26 ENCOUNTER — Encounter (INDEPENDENT_AMBULATORY_CARE_PROVIDER_SITE_OTHER): Payer: Self-pay | Admitting: Gastroenterology

## 2023-12-26 ENCOUNTER — Ambulatory Visit (INDEPENDENT_AMBULATORY_CARE_PROVIDER_SITE_OTHER): Admitting: Gastroenterology

## 2023-12-26 VITALS — BP 113/69 | HR 86 | Temp 97.8°F | Ht 64.0 in | Wt 140.3 lb

## 2023-12-26 DIAGNOSIS — K5903 Drug induced constipation: Secondary | ICD-10-CM

## 2023-12-26 DIAGNOSIS — D509 Iron deficiency anemia, unspecified: Secondary | ICD-10-CM

## 2023-12-26 DIAGNOSIS — K219 Gastro-esophageal reflux disease without esophagitis: Secondary | ICD-10-CM | POA: Diagnosis not present

## 2023-12-26 DIAGNOSIS — K5900 Constipation, unspecified: Secondary | ICD-10-CM | POA: Insufficient documentation

## 2023-12-26 MED ORDER — VOQUEZNA 10 MG PO TABS
10.0000 mg | ORAL_TABLET | Freq: Every day | ORAL | Status: DC
Start: 2023-12-26 — End: 2024-02-03

## 2023-12-26 NOTE — Progress Notes (Unsigned)
 [name], M.D. Gastroenterology & Hepatology [facility] [address] [place], [place] [address]  Primary Care Physician: [name], MD [address] [name] [address]  I will communicate my assessment and recommendations to the referring MD via EMR.  Problems: Mild iron deficiency anemia GERD   History of Present Illness: [name] is a 57 y.o.  female with past medical history of COPD on 3L/min, Hashimoto's thyroiditis, adrenal insufficiency, GERD, hyperlipidemia, sleep apnea, prediabetes,  who presents for follow up of anemia and GERD.  The patient was last seen on 10/24/2023. At that time, the patient was scheduled for EGD and colonoscopy with findings described below.  She was initially continued on pantoprazole  twice daily and famotidine , but given ongoing symptoms she was switched to Voquezna .  Patient was continued on oral iron daily.  Patient reports that after starting Voquezna , she has felt significant improvement ion her symptoms. She is not having issues laying flat. Has very rare issues with heartburn and discomfort in her chest, especially when eating spicy foods. No dysphagia or odynophagia. She will finish the 20 mg   She is having a BM every third day, for which she takes Senna or Dulcolax. Does not take any medication on a regular basis.  The patient denies having any nausea, vomiting, fever, chills, hematochezia, melena, hematemesis, abdominal distention, abdominal pain, diarrhea, jaundice, pruritus or weight loss.  Last EGD: 10/31/2023 Irregular Z-line (mild acute esophagitis), normal stomach and duodenum (normal biopsies). Last Colonoscopy: 10/31/2023 Normal terminal ileum, 3 mm polyp in the descending colon (normal tissue), diverticulosis and internal hemorrhoids.  Recommended repeat colonoscopy in 10 years.  Past Medical History: Past Medical History:  Diagnosis Date   Adrenal  insufficiency (HCC)    Anemia    Anxiety    Arthritis    Asthma    Balance disorder    Cervical radiculopathy    Chronic kidney disease    Stage 3 Chronic Kidney Disease   Cluster headaches    COPD (chronic obstructive pulmonary disease) (HCC)    COPD (chronic obstructive pulmonary disease) with chronic bronchitis (HCC)    GERD (gastroesophageal reflux disease)    Hyperlipemia    Hypoglycemia    Hypothyroidism    Migraine    Oxygen  dependent    3L continuous   Pneumothorax    PONV (postoperative nausea and vomiting)    Pre-diabetes    Rectal discharge 07/28/2010   Shortness of breath    Sleep apnea    cannot tolerate-not using CPAP   Sluggishness 07/28/2010    Past Surgical History: Past Surgical History:  Procedure Laterality Date   ABDOMINAL HYSTERECTOMY     with bladder suspension   CERVICAL FUSION     CHOLECYSTECTOMY     COLONOSCOPY N/A 07/30/2013   Procedure: COLONOSCOPY;  Surgeon: Claudis RAYMOND Rivet, MD;  [facility];  Service: Endoscopy;  Laterality: N/A;  930   COLONOSCOPY N/A 10/31/2023   Procedure: COLONOSCOPY;  Surgeon: Fortune Angelia Toribio, MD;  [facility];  Service: Gastroenterology;  Laterality: N/A;  9:45 am, asa 3   ESOPHAGOGASTRODUODENOSCOPY N/A 10/31/2023   Procedure: EGD (ESOPHAGOGASTRODUODENOSCOPY);  Surgeon: Fortune Angelia, Toribio, MD;  [facility];  Service: Gastroenterology;  Laterality: N/A;   EXAM UNDER ANESTHESIA WITH MANIPULATION OF SHOULDER Right 10/11/2016   Procedure: EXAM UNDER ANESTHESIA WITH MANIPULATION OF SHOULDER;  Surgeon: Margrette Taft BRAVO, MD;  Location: [facility];  Service: Orthopedics;  Laterality:  Right;   HERNIA REPAIR     INCISIONAL HERNIA REPAIR N/A 07/30/2016   Procedure: HERNIA REPAIR INCISIONAL WITH MESH;  Surgeon: Oneil Budge, MD;  Location: [facility];  Service: General;  Laterality: N/A;   INTERSTIM IMPLANT PLACEMENT  02/02/2021   Procedure: RENNA IMPLANT FIRST STAGE LEFT SACRUM  ;  Surgeon: Sherrilee Belvie CROME, MD;  Location: [facility];  Service: Urology;;   RENNA IMPLANT PLACEMENT N/A 02/23/2021   Procedure: RENNA IMPLANT SECOND STAGE;  Surgeon: Sherrilee Belvie CROME, MD;  Location: [facility];  Service: Urology;  Laterality: N/A;  Rep coming, time needs to stay at 2:00   INTERSTIM IMPLANT REVISION N/A 10/25/2022   Procedure: REVISION OF RENNA;  Surgeon: Sherrilee Belvie CROME, MD;  Location: [facility];  Service: Urology;  Laterality: N/A;   JOINT REPLACEMENT     LOBECTOMY     right lung    POSTERIOR CERVICAL LAMINECTOMY Right 04/05/2017   Procedure: Right C6-7 Posterior cervical laminectomy;  Surgeon: Onetha Kuba, MD;  Location: [facility] OR;  Service: Neurosurgery;  Laterality: Right;  Right C6-7 Posterior cervical laminectomy   SHOULDER ARTHROSCOPY WITH ROTATOR CUFF REPAIR Right 10/11/2016   Procedure: SHOULDER ARTHROSCOPY;  Surgeon: Margrette Taft BRAVO, MD;  Location: [facility];  Service: Orthopedics;  Laterality: Right;   TOTAL HIP ARTHROPLASTY Right 04/15/2020   Procedure: RIGHT TOTAL HIP ARTHROPLASTY ANTERIOR APPROACH;  Surgeon: Vernetta Lonni GRADE, MD;  Location: [facility];  Service: Orthopedics;  Laterality: Right;   TOTAL HIP ARTHROPLASTY Left 07/22/2020   Procedure: LEFT  HIP ARTHROPLASTY ANTERIOR APPROACH;  Surgeon: Vernetta Lonni GRADE, MD;  Location: [facility];  Service: Orthopedics;  Laterality: Left;  RNFA   TUBAL LIGATION      Family History: Family History  Problem Relation Age of Onset   Neuropathy Mother    Migraines Mother    Cancer Mother    COPD Mother    Diabetes Mother    Hyperlipidemia Mother    Hypertension Mother    Hyperlipidemia Father    Hypertension Father    Migraines Sister    Migraines Maternal Aunt    Stroke Maternal Grandmother    Heart failure Maternal Grandmother    Hypertension Maternal Grandmother    Thyroid  disease Maternal Grandmother    Diabetes Paternal Grandmother    Alzheimer's disease Paternal Grandmother    Stroke  Paternal Grandfather    Heart failure Paternal Grandfather    Hypertension Paternal Grandfather    Bipolar disorder Son    Colon cancer Neg Hx     Social History: Social History   Tobacco Use  Smoking Status Former   Current packs/day: 0.00   Average packs/day: 1 pack/day for 30.0 years (30.0 ttl pk-yrs)   Types: Cigarettes   Start date: 02/20/1978   Quit date: 02/21/2008   Years since quitting: 15.8  Smokeless Tobacco Never   Social History   Substance and Sexual Activity  Alcohol Use No   Alcohol/week: 0.0 standard drinks of alcohol   Comment: no   Social History   Substance and Sexual Activity  Drug Use No    Allergies: Allergies  Allergen Reactions   Aspirin  Shortness Of Breath    Causes asthma flares    Penicillins Other (See Comments)    UNSPECIFIED REACTION OF CHILDHOOD  Has patient had a PCN reaction causing immediate rash, facial/tongue/throat swelling, SOB or lightheadedness with hypotension: NO Has patient had a PCN reaction causing severe rash involving mucus membranes or skin necrosis: NO Has patient had a  PCN reaction that required hospitalization: no Has patient had a PCN reaction occurring within the last 10 years: NO If all of the above answers are NO, then may proceed with Cephalosporin use.    Vicodin [Hydrocodone -Acetaminophen ] Other (See Comments)    Headaches     Medications: Current Outpatient Medications  Medication Sig Dispense Refill   atorvastatin (LIPITOR) 10 MG tablet Take 1 tablet by mouth daily.     benralizumab  (FASENRA  PEN) 30 MG/ML prefilled autoinjector Inject 1 mL (30 mg total) into the skin every 8 (eight) weeks. 1 mL 6   budesonide -glycopyrrolate -formoterol  (BREZTRI  AEROSPHERE) 160-9-4.8 MCG/ACT AERO inhaler Inhale 2 puffs into the lungs in the morning and at bedtime. 3 each 4   candesartan (ATACAND) 4 MG tablet Take 4 mg by mouth daily.     Cariprazine HCl (VRAYLAR PO) Take 1 capsule by mouth daily.     cetirizine  (ZYRTEC) 10 MG tablet Take 10 mg by mouth daily.     Cholecalciferol (VITAMIN D3 SUPER STRENGTH PO) Take 5,000 Units by mouth daily.     citalopram  (CELEXA ) 20 MG tablet Take 20 mg by mouth daily.     estradiol (ESTRACE) 0.1 MG/GM vaginal cream Place vaginally at bedtime.     FEROSUL 325 (65 Fe) MG tablet Take 325 mg by mouth daily.     fludrocortisone (FLORINEF) 0.1 MG tablet Take 0.1 mg by mouth daily.     Lancets (ONETOUCH DELICA PLUS LANCET33G) MISC Apply topically.     latanoprost (XALATAN) 0.005 % ophthalmic solution Place 1 drop into both eyes at bedtime.     levalbuterol  (XOPENEX  HFA) 45 MCG/ACT inhaler Inhale into the lungs every 4 (four) hours as needed for wheezing.     montelukast  (SINGULAIR ) 10 MG tablet Take 1 tablet (10 mg total) by mouth at bedtime. 90 tablet 1   Multiple Vitamin (MULTIVITAMIN WITH MINERALS) TABS tablet Take 1 tablet by mouth daily.     ONETOUCH VERIO test strip SMARTSIG:1 Strip(s) Via Meter Twice Daily PRN     predniSONE  (DELTASONE ) 10 MG tablet Take 1 tablet (10 mg total) by mouth daily with breakfast. 90 tablet 1   pregabalin  (LYRICA ) 75 MG capsule Take 75 mg by mouth 2 (two) times daily.     Spacer/Aero-Holding Chambers DEVI 1 each by Does not apply route daily as needed. 1 each 2   SUMAtriptan  6 MG/0.5ML SOAJ Inject 6 mg as directed daily as needed (migraine).      TIROSINT  88 MCG CAPS Take 1 capsule (88 mcg total) by mouth daily before breakfast. 90 capsule 1   Triamcinolone  Acetonide (NASACORT  ALLERGY  24HR NA) Place 2 sprays into the nose daily as needed (allergies).     valACYclovir  (VALTREX ) 500 MG tablet Take 500-2,000 mg by mouth See admin instructions. Take 2000mg s at first sign of fever blister outbreak then take 500mg s daily until gone     Vonoprazan Fumarate  (VOQUEZNA ) 20 MG TABS Take 1 each by mouth daily. 56 tablet 0   XOPENEX  HFA 45 MCG/ACT inhaler INHALE TWO PUFFS BY MOUTH INTO LUNGS EVERY 6 HOURS AS NEEDED FOR WHEEZING 15 g 2   Vonoprazan  Fumarate (VOQUEZNA ) 10 MG TABS Take 1 each by mouth daily. Start taking after finishing 8 weeks of 20 mg dosing (Patient not taking: Reported on 12/26/2023) 90 tablet 3   No current facility-administered medications for this visit.    Review of Systems: GENERAL: negative for malaise, night sweats HEENT: No changes in hearing or vision, no nose bleeds  or other nasal problems. NECK: Negative for lumps, goiter, pain and significant neck swelling RESPIRATORY: Negative for cough, wheezing CARDIOVASCULAR: Negative for chest pain, leg swelling, palpitations, orthopnea GI: SEE HPI MUSCULOSKELETAL: Negative for joint pain or swelling, back pain, and muscle pain. SKIN: Negative for lesions, rash PSYCH: Negative for sleep disturbance, mood disorder and recent psychosocial stressors. HEMATOLOGY Negative for prolonged bleeding, bruising easily, and swollen nodes. ENDOCRINE: Negative for cold or heat intolerance, polyuria, polydipsia and goiter. NEURO: negative for tremor, gait imbalance, syncope and seizures. The remainder of the review of systems is noncontributory.   Physical Exam: BP 113/69 (BP Location: Left Arm, Patient Position: Sitting, Cuff Size: Normal)   Pulse 86   Temp 97.8 F (36.6 C) (Temporal)   Ht 5' 4 (1.626 m)   Wt 140 lb 4.8 oz (63.6 kg)   BMI 24.08 kg/m  GENERAL: The patient is AO x3, in no acute distress. HEENT: Head is normocephalic and atraumatic. EOMI are intact. Mouth is well hydrated and without lesions. NECK: Supple. No masses LUNGS: Clear to auscultation. No presence of rhonchi/wheezing/rales. Adequate chest expansion HEART: RRR, normal s1 and s2. ABDOMEN: Soft, nontender, no guarding, no peritoneal signs, and nondistended. BS +. No masses. RECTAL EXAM: no external lesions, normal tone, no masses, brown stool without blood.*** Chaperone: EXTREMITIES: Without any cyanosis, clubbing, rash, lesions or edema. NEUROLOGIC: AOx3, no focal motor deficit. SKIN: no  jaundice, no rashes  Imaging/Labs: as above  I personally reviewed and interpreted the available labs, imaging and endoscopic files.  Impression and Plan: [name] is a 58 y.o. female coming for follow up of ***   All questions were answered.      [name], MD Gastroenterology and Hepatology [facility]

## 2023-12-26 NOTE — Patient Instructions (Addendum)
 Continue Voquezna  10 mg daily - will provide samples for next 2 weeks Start taking senna daily for constipation Continue daily intake of oral iron

## 2024-01-03 ENCOUNTER — Telehealth (INDEPENDENT_AMBULATORY_CARE_PROVIDER_SITE_OTHER): Payer: Self-pay | Admitting: Gastroenterology

## 2024-01-03 ENCOUNTER — Ambulatory Visit: Payer: Medicare Other | Admitting: Urology

## 2024-01-03 NOTE — Telephone Encounter (Signed)
 [name] from [place] contacted office to get more clinical information regarding Voquezna  10mg . [name] asked for diagnosis code-gave her K21.9 GERD. Also asked if pt had tried and failed any medications related-advised she had tried and failed Pantoprazole  40 mg, Famotidine  20 mg, Esomeprazole 20 and 40 mg. [name] also needing more clinical rationale-stated information from most recent office visit (Patient reports that after starting Voquezna , she has felt significant improvement ion her symptoms. She is not having issues laying flat. Has very rare issues with heartburn and discomfort in her chest, especially when eating spicy foods. No dysphagia or odynophagia.) Voquezna  is not on patient plan. [name] also asked was patient over the age of 49. Advised no patient is 58. [name] states she will pass this on to the pharmacy and they will contact us  with information

## 2024-01-06 NOTE — Telephone Encounter (Signed)
 Per [facility] Medicare the Voquezna  10 mg was approved from 01/03/2024- 01/02/2025.

## 2024-01-13 ENCOUNTER — Other Ambulatory Visit (HOSPITAL_COMMUNITY): Payer: Self-pay

## 2024-01-16 ENCOUNTER — Other Ambulatory Visit: Payer: Self-pay

## 2024-01-16 ENCOUNTER — Encounter (INDEPENDENT_AMBULATORY_CARE_PROVIDER_SITE_OTHER): Payer: Self-pay

## 2024-01-16 ENCOUNTER — Other Ambulatory Visit (HOSPITAL_COMMUNITY): Payer: Self-pay

## 2024-01-16 NOTE — Progress Notes (Signed)
 Specialty Pharmacy Refill Coordination Note  MyChart Questionnaire Submission  [name] is a 58 y.o. female contacted today regarding refills of specialty medication(s) Fasenra .  Doses on hand: (Patient-Rptd) 0   Injection date: (Patient-Rptd) 01/27/24  Patient requested: (Patient-Rptd) Delivery   Delivery date: 01/21/24  Verified address: [address] [place] [address]  Medication will be filled on 01/20/24.

## 2024-01-17 ENCOUNTER — Other Ambulatory Visit: Payer: Self-pay

## 2024-01-20 ENCOUNTER — Other Ambulatory Visit: Payer: Self-pay

## 2024-01-22 ENCOUNTER — Encounter (INDEPENDENT_AMBULATORY_CARE_PROVIDER_SITE_OTHER): Payer: Self-pay | Admitting: Gastroenterology

## 2024-02-03 ENCOUNTER — Other Ambulatory Visit (INDEPENDENT_AMBULATORY_CARE_PROVIDER_SITE_OTHER): Payer: Self-pay

## 2024-02-03 DIAGNOSIS — K219 Gastro-esophageal reflux disease without esophagitis: Secondary | ICD-10-CM

## 2024-02-03 MED ORDER — VOQUEZNA 10 MG PO TABS: 10.0000 mg | ORAL_TABLET | Freq: Every day | ORAL | 3 refills | Status: DC

## 2024-02-03 NOTE — Telephone Encounter (Signed)
  Refill request from [facility] for Voquezna  10 mg daily.

## 2024-02-11 ENCOUNTER — Telehealth (INDEPENDENT_AMBULATORY_CARE_PROVIDER_SITE_OTHER): Payer: Self-pay

## 2024-02-11 ENCOUNTER — Other Ambulatory Visit (INDEPENDENT_AMBULATORY_CARE_PROVIDER_SITE_OTHER): Payer: Self-pay

## 2024-02-11 DIAGNOSIS — K219 Gastro-esophageal reflux disease without esophagitis: Secondary | ICD-10-CM

## 2024-02-11 MED ORDER — VOQUEZNA 10 MG PO TABS
10.0000 mg | ORAL_TABLET | Freq: Every day | ORAL | 3 refills | Status: AC
Start: 2024-02-11 — End: ?

## 2024-02-11 NOTE — Telephone Encounter (Signed)
 Patient called today and states she wants her Voquezna  10 mg script sent to [facility]. She says she has had issues with [facility]. I have sent a 90 day script to [facility] pharmacy per patient request. She says [facility] was refusing to use her insurance to pay for the script and told her she had to pay cash price of $ 50.00, which she says she can not afford.

## 2024-03-04 ENCOUNTER — Ambulatory Visit: Admitting: Urology

## 2024-03-04 VITALS — BP 112/70 | HR 97

## 2024-03-04 DIAGNOSIS — N3281 Overactive bladder: Secondary | ICD-10-CM

## 2024-03-04 LAB — URINALYSIS, ROUTINE W REFLEX MICROSCOPIC
Bilirubin, UA: NEGATIVE
Glucose, UA: NEGATIVE
Ketones, UA: NEGATIVE
Leukocytes,UA: NEGATIVE
Nitrite, UA: NEGATIVE
Protein,UA: NEGATIVE
RBC, UA: NEGATIVE
Specific Gravity, UA: 1.01 (ref 1.005–1.030)
Urobilinogen, Ur: 0.2 mg/dL (ref 0.2–1.0)
pH, UA: 6.5 (ref 5.0–7.5)

## 2024-03-04 NOTE — Patient Instructions (Signed)

## 2024-03-04 NOTE — Progress Notes (Signed)
 03/04/2024 2:17 PM   [name] April 25, 1965 [id number]  Referring provider: [name], MD [address] [name],  [place] [address]  Followup OAB   HPI: [name] is a 58yo here for followup for OAB. She has been doing well since interstim lead revision. Nocturia 2x. No pad usage. No UTIs since last visit. Urinary frequency every 3-4 hours   PMH: Past Medical History:  Diagnosis Date   Adrenal insufficiency    Anemia    Anxiety    Arthritis    Asthma    Balance disorder    Cervical radiculopathy    Chronic kidney disease    Stage 3 Chronic Kidney Disease   Cluster headaches    COPD (chronic obstructive pulmonary disease) (HCC)    COPD (chronic obstructive pulmonary disease) with chronic bronchitis (HCC)    GERD (gastroesophageal reflux disease)    Hyperlipemia    Hypoglycemia    Hypothyroidism    Migraine    Oxygen  dependent    3L continuous   Pneumothorax    PONV (postoperative nausea and vomiting)    Pre-diabetes    Rectal discharge 07/28/2010   Shortness of breath    Sleep apnea    cannot tolerate-not using CPAP   Sluggishness 07/28/2010    Surgical History: Past Surgical History:  Procedure Laterality Date   ABDOMINAL HYSTERECTOMY     with bladder suspension   CERVICAL FUSION     CHOLECYSTECTOMY     COLONOSCOPY N/A 07/30/2013   Procedure: COLONOSCOPY;  Surgeon: Claudis RAYMOND Rivet, MD;  [facility];  Service: Endoscopy;  Laterality: N/A;  930   COLONOSCOPY N/A 10/31/2023   Procedure: COLONOSCOPY;  Surgeon: Eartha Angelia Sieving, MD;  [facility]: [facility];  Service: Gastroenterology;  Laterality: N/A;  9:45 am, asa 3   ESOPHAGOGASTRODUODENOSCOPY N/A 10/31/2023   Procedure: EGD (ESOPHAGOGASTRODUODENOSCOPY);  Surgeon: Eartha Angelia, Sieving, MD;  [facility];  Service: Gastroenterology;  Laterality: N/A;   EXAM UNDER ANESTHESIA WITH MANIPULATION OF SHOULDER Right 10/11/2016   Procedure: EXAM UNDER ANESTHESIA  WITH MANIPULATION OF SHOULDER;  Surgeon: Margrette Taft BRAVO, MD;  Location: [facility];  Service: Orthopedics;  Laterality: Right;   HERNIA REPAIR     INCISIONAL HERNIA REPAIR N/A 07/30/2016   Procedure: HERNIA REPAIR INCISIONAL WITH MESH;  Surgeon: Oneil Budge, MD;  Location: [facility];  Service: General;  Laterality: N/A;   INTERSTIM IMPLANT PLACEMENT  02/02/2021   Procedure: RENNA IMPLANT FIRST STAGE LEFT SACRUM ;  Surgeon: Sherrilee Belvie CROME, MD;  Location: [facility];  Service: Urology;;   RENNA IMPLANT PLACEMENT N/A 02/23/2021   Procedure: RENNA IMPLANT SECOND STAGE;  Surgeon: Sherrilee Belvie CROME, MD;  Location: [facility];  Service: Urology;  Laterality: N/A;  Rep coming, time needs to stay at 2:00   INTERSTIM IMPLANT REVISION N/A 10/25/2022   Procedure: REVISION OF RENNA;  Surgeon: Sherrilee Belvie CROME, MD;  Location: [facility];  Service: Urology;  Laterality: N/A;   JOINT REPLACEMENT     LOBECTOMY     right lung    POSTERIOR CERVICAL LAMINECTOMY Right 04/05/2017   Procedure: Right C6-7 Posterior cervical laminectomy;  Surgeon: Onetha Kuba, MD;  Location: [facility];  Service: Neurosurgery;  Laterality: Right;  Right C6-7 Posterior cervical laminectomy   SHOULDER ARTHROSCOPY WITH ROTATOR CUFF REPAIR Right 10/11/2016   Procedure: SHOULDER ARTHROSCOPY;  Surgeon: Margrette Taft BRAVO, MD;  Location: [facility];  Service: Orthopedics;  Laterality: Right;   TOTAL HIP ARTHROPLASTY  Right 04/15/2020   Procedure: RIGHT TOTAL HIP ARTHROPLASTY ANTERIOR APPROACH;  Surgeon: Vernetta Lonni GRADE, MD;  Location: [facility];  Service: Orthopedics;  Laterality: Right;   TOTAL HIP ARTHROPLASTY Left 07/22/2020   Procedure: LEFT  HIP ARTHROPLASTY ANTERIOR APPROACH;  Surgeon: Vernetta Lonni GRADE, MD;  Location: [facility];  Service: Orthopedics;  Laterality: Left;  RNFA   TUBAL LIGATION      Home Medications:  Allergies as of 03/04/2024       Reactions   Aspirin  Shortness Of Breath   Causes asthma flares     Penicillins Other (See Comments)   UNSPECIFIED REACTION OF CHILDHOOD Has patient had a PCN reaction causing immediate rash, facial/tongue/throat swelling, SOB or lightheadedness with hypotension: NO Has patient had a PCN reaction causing severe rash involving mucus membranes or skin necrosis: NO Has patient had a PCN reaction that required hospitalization: no Has patient had a PCN reaction occurring within the last 10 years: NO If all of the above answers are NO, then may proceed with Cephalosporin use.   Vicodin [hydrocodone -acetaminophen ] Other (See Comments)   Headaches         Medication List        Accurate as of March 04, 2024  2:17 PM. If you have any questions, ask your nurse or doctor.          atorvastatin 10 MG tablet Commonly known as: LIPITOR Take 1 tablet by mouth daily.   Breztri  Aerosphere 160-9-4.8 MCG/ACT Aero inhaler Generic drug: budesonide -glycopyrrolate -formoterol  Inhale 2 puffs into the lungs in the morning and at bedtime.   candesartan 4 MG tablet Commonly known as: ATACAND Take 4 mg by mouth daily.   cetirizine 10 MG tablet Commonly known as: ZYRTEC Take 10 mg by mouth daily.   citalopram  20 MG tablet Commonly known as: CELEXA  Take 20 mg by mouth daily.   estradiol 0.1 MG/GM vaginal cream Commonly known as: ESTRACE Place vaginally at bedtime.   Fasenra  Pen 30 MG/ML prefilled autoinjector Generic drug: benralizumab  Inject 1 mL (30 mg total) into the skin every 8 (eight) weeks.   FeroSul 325 (65 Fe) MG tablet Generic drug: ferrous sulfate Take 325 mg by mouth daily.   fludrocortisone 0.1 MG tablet Commonly known as: FLORINEF Take 0.1 mg by mouth daily.   latanoprost 0.005 % ophthalmic solution Commonly known as: XALATAN Place 1 drop into both eyes at bedtime.   montelukast  10 MG tablet Commonly known as: SINGULAIR  Take 1 tablet (10 mg total) by mouth at bedtime.   multivitamin with minerals Tabs tablet Take 1 tablet by  mouth daily.   NASACORT  ALLERGY  24HR NA Place 2 sprays into the nose daily as needed (allergies).   OneTouch Delica Plus Lancet33G Misc Apply topically.   OneTouch Verio test strip Generic drug: glucose blood SMARTSIG:1 Strip(s) Via Meter Twice Daily PRN   predniSONE  10 MG tablet Commonly known as: DELTASONE  Take 1 tablet (10 mg total) by mouth daily with breakfast.   pregabalin  75 MG capsule Commonly known as: LYRICA  Take 75 mg by mouth 2 (two) times daily.   Spacer/Aero-Holding Raguel French 1 each by Does not apply route daily as needed.   SUMAtriptan  6 MG/0.5ML Soaj Inject 6 mg as directed daily as needed (migraine).   Tirosint  88 MCG Caps Generic drug: Levothyroxine  Sodium Take 1 capsule (88 mcg total) by mouth daily before breakfast.   valACYclovir  500 MG tablet Commonly known as: VALTREX  Take 500-2,000 mg by mouth See admin instructions. Take 2000mg s at first  sign of fever blister outbreak then take 500mg s daily until gone   VITAMIN D3 SUPER STRENGTH PO Take 5,000 Units by mouth daily.   Voquezna  10 MG Tabs Generic drug: Vonoprazan Fumarate  Take 10 mg by mouth daily at 6 (six) AM.   VRAYLAR PO Take 1 capsule by mouth daily.   levalbuterol  45 MCG/ACT inhaler Commonly known as: XOPENEX  HFA Inhale into the lungs every 4 (four) hours as needed for wheezing.   Xopenex  HFA 45 MCG/ACT inhaler Generic drug: levalbuterol  INHALE TWO PUFFS BY MOUTH INTO LUNGS EVERY 6 HOURS AS NEEDED FOR WHEEZING        Allergies:  Allergies  Allergen Reactions   Aspirin  Shortness Of Breath    Causes asthma flares    Penicillins Other (See Comments)    UNSPECIFIED REACTION OF CHILDHOOD  Has patient had a PCN reaction causing immediate rash, facial/tongue/throat swelling, SOB or lightheadedness with hypotension: NO Has patient had a PCN reaction causing severe rash involving mucus membranes or skin necrosis: NO Has patient had a PCN reaction that required hospitalization:  no Has patient had a PCN reaction occurring within the last 10 years: NO If all of the above answers are NO, then may proceed with Cephalosporin use.    Vicodin [Hydrocodone -Acetaminophen ] Other (See Comments)    Headaches     Family History: Family History  Problem Relation Age of Onset   Neuropathy Mother    Migraines Mother    Cancer Mother    COPD Mother    Diabetes Mother    Hyperlipidemia Mother    Hypertension Mother    Hyperlipidemia Father    Hypertension Father    Migraines Sister    Migraines Maternal Aunt    Stroke Maternal Grandmother    Heart failure Maternal Grandmother    Hypertension Maternal Grandmother    Thyroid  disease Maternal Grandmother    Diabetes Paternal Grandmother    Alzheimer's disease Paternal Grandmother    Stroke Paternal Grandfather    Heart failure Paternal Grandfather    Hypertension Paternal Grandfather    Bipolar disorder Son    Colon cancer Neg Hx     Social History:  reports that she quit smoking about 16 years ago. Her smoking use included cigarettes. She started smoking about 46 years ago. She has a 30 pack-year smoking history. She has never used smokeless tobacco. She reports that she does not drink alcohol and does not use drugs.  ROS: All other review of systems were reviewed and are negative except what is noted above in HPI  Physical Exam: BP 112/70   Pulse 97   Constitutional:  Alert and oriented, No acute distress. HEENT: [place] AT, moist mucus membranes.  Trachea midline, no masses. Cardiovascular: No clubbing, cyanosis, or edema. Respiratory: Normal respiratory effort, no increased work of breathing. GI: Abdomen is soft, nontender, nondistended, no abdominal masses GU: No CVA tenderness.  Lymph: No cervical or inguinal lymphadenopathy. Skin: No rashes, bruises or suspicious lesions. Neurologic: Grossly intact, no focal deficits, moving all 4 extremities. Psychiatric: Normal mood and affect.  Laboratory Data: Lab  Results  Component Value Date   WBC 9.7 10/29/2023   HGB 11.6 (L) 10/29/2023   HCT 36.0 10/29/2023   MCV 93.8 10/29/2023   PLT 285 10/29/2023    Lab Results  Component Value Date   CREATININE 0.94 10/29/2023    No results found for: PSA  No results found for: TESTOSTERONE  Lab Results  Component Value Date   HGBA1C 5.6 08/27/2023  Urinalysis    Component Value Date/Time   APPEARANCEUR Clear 01/03/2023 1402   GLUCOSEU Negative 01/03/2023 1402   BILIRUBINUR Negative 01/03/2023 1402   PROTEINUR Negative 01/03/2023 1402   NITRITE Negative 01/03/2023 1402   LEUKOCYTESUR Negative 01/03/2023 1402    Lab Results  Component Value Date   LABMICR Comment 01/03/2023   WBCUA 0-5 06/07/2021   LABEPIT 0-10 06/07/2021   BACTERIA Few (A) 06/07/2021    Pertinent Imaging:  Results for orders placed in visit on 06/06/22  Abdomen 1 view (KUB)  Narrative CLINICAL DATA:  Foreign body. Patient states that her bladder stimulator is shocking her 2 times per month following a fall. Urinary incontinence.  EXAM: ABDOMEN - 1 VIEW  COMPARISON:  CT abdomen and pelvis 01/01/2020  FINDINGS: Nonobstructive bowel-gas pattern. Large stool burden in the right colon. Cholecystectomy. No acute osseous abnormality. Bilateral THA. Bladder stimulator projecting over the right iliac bone with lead projecting over the superior left pelvis.  IMPRESSION: 1. Bladder stimulator projecting over the right iliac bone with lead projecting over the superior left pelvis. 2. Large stool burden in the right colon.   Electronically Signed By: Norman Gatlin M.D. On: 06/08/2022 02:01  No results found for this or any previous visit.  No results found for this or any previous visit.  No results found for this or any previous visit.  Results for orders placed during the hospital encounter of 04/01/23  US  RENAL  Narrative CLINICAL DATA:  Hyperthyroidism  EXAM: RENAL / URINARY TRACT  ULTRASOUND COMPLETE  COMPARISON:  CT abdomen pelvis 03/06/2023  FINDINGS: Right Kidney:  Renal measurements: 10.7 x 5.9 x 6.6 cm = volume: 217.1 mL. Echogenicity within normal limits. No mass or hydronephrosis visualized.  Left Kidney:  Renal measurements: 10.5 x 5.5 x 4.8 cm = volume: 140.1 mL. Echogenicity within normal limits. No mass or hydronephrosis visualized. Possible 6 mm stone midpole left kidney.  Bladder:  Appears normal for degree of bladder distention.  Other:  None.  IMPRESSION: 1. No hydronephrosis. 2. Possible 6 mm stone midpole left kidney.   Electronically Signed By: Bard Moats M.D. On: 04/01/2023 10:13  No results found for this or any previous visit.  No results found for this or any previous visit.  No results found for this or any previous visit.   Assessment & Plan:    1. Overactive bladder (Primary) Followup 1 year - Urinalysis, Routine w reflex microscopic   No follow-ups on file.  Belvie Clara, MD  [facility] [place]

## 2024-03-09 ENCOUNTER — Other Ambulatory Visit: Payer: Self-pay

## 2024-03-09 ENCOUNTER — Other Ambulatory Visit: Payer: Self-pay | Admitting: Pharmacy Technician

## 2024-03-09 NOTE — Progress Notes (Signed)
 Specialty Pharmacy Refill Coordination Note  [name] is a 58 y.o. female contacted today regarding refills of specialty medication(s) Benralizumab  (Fasenra  Pen)   Patient requested (Patient-Rptd) Delivery   Delivery date: 03/18/2024 Verified address: (Patient-Rptd) [address], [place], [place] [address]   Medication will be filled on:03/17/2024

## 2024-03-10 ENCOUNTER — Encounter: Payer: Self-pay | Admitting: Urology

## 2024-03-12 ENCOUNTER — Other Ambulatory Visit: Payer: Self-pay | Admitting: "Endocrinology

## 2024-03-12 LAB — LAB REPORT - SCANNED
A1c: 5.6
Albumin, Urine POC: 4.1
Albumin/Creatinine Ratio, Urine, POC: 11
Creatinine, POC: 36.7 mg/dL
EGFR: 82

## 2024-03-17 ENCOUNTER — Other Ambulatory Visit: Payer: Self-pay

## 2024-04-29 ENCOUNTER — Other Ambulatory Visit: Payer: Self-pay

## 2024-04-29 ENCOUNTER — Ambulatory Visit: Admitting: Allergy & Immunology

## 2024-04-29 ENCOUNTER — Encounter: Payer: Self-pay | Admitting: Allergy & Immunology

## 2024-04-29 VITALS — BP 100/60 | HR 91 | Temp 98.3°F

## 2024-04-29 DIAGNOSIS — Z9981 Dependence on supplemental oxygen: Secondary | ICD-10-CM

## 2024-04-29 DIAGNOSIS — J4489 Other specified chronic obstructive pulmonary disease: Secondary | ICD-10-CM

## 2024-04-29 DIAGNOSIS — J3089 Other allergic rhinitis: Secondary | ICD-10-CM

## 2024-04-29 DIAGNOSIS — J302 Other seasonal allergic rhinitis: Secondary | ICD-10-CM | POA: Diagnosis not present

## 2024-04-29 DIAGNOSIS — F1920 Other psychoactive substance dependence, uncomplicated: Secondary | ICD-10-CM

## 2024-04-29 DIAGNOSIS — D849 Immunodeficiency, unspecified: Secondary | ICD-10-CM

## 2024-04-29 MED ORDER — BREZTRI AEROSPHERE 160-9-4.8 MCG/ACT IN AERO: 2.0000 | INHALATION_SPRAY | Freq: Two times a day (BID) | RESPIRATORY_TRACT | 4 refills | Status: AC

## 2024-04-29 MED ORDER — PREDNISONE 5 MG PO TABS: ORAL_TABLET | ORAL | 2 refills | Status: AC

## 2024-04-29 NOTE — Progress Notes (Unsigned)
 "  FOLLOW UP  Date of Service/Encounter:  04/29/24   Assessment:   Steroid and oxygen  dependent asthma-COPD overlap syndrome - doing well on Fasenra     Multiple complications of chronic prednisone  use, including avascular necrosis    Stage 3 CKD   AAT1 - MM phenotype with level of 141    Weaning steroids (currently on 7.5 mg and 5 mg every other day alternating)   Seasonal and perennial allergic rhinitis (horse, tobacco leaf, grasses, ragweed, trees, indoor molds, outdoor molds, dust mites, cat and dog)   Osteopenia - bone density in 2022    Balance disorder - in Physical Therapy for this    On disability  Plan/Recommendations:   1. Asthma-COPD overlap syndrome - Lung testing not done today (we will do it next time). - We will not make any changes today.  - I did send in prednisone  to use during periods of illness.  - Daily controller medication(s): Breztri  two puffs TWICE DAILY WITH SPACER + prednisone  10mg   daily + Singulair  10mg  daily + Fasenra  every 8 weeks - Rescue medications: Xopenex  4 puffs every 4-6 hours as needed or Xopenex  nebulizer one vial every 4-6 hours as needed - Asthma control goals:  * Full participation in all desired activities (may need albuterol  before activity) * Albuterol  use two time or less a week on average (not counting use with activity) * Cough interfering with sleep two time or less a month * Oral steroids no more than once a year * No hospitalizations   2. Chronic rhinitis (horse, tobacco leaf, grasses, ragweed, trees, indoor molds, outdoor molds, dust mites, cat and dog) - Continue with: Zyrtec (cetirizine) 10mg  tablet once daily and Singulair  (montelukast ) 10mg  daily and Nasacort  (triamcinolone ) two sprays per nostril daily AS NEEDED - Continue with nasal saline rinses.  3. Return in about 6 months (around 10/27/2024). You can have the follow up appointment with Dr. Iva or a Nurse Practicioner (our Nurse Practitioners are excellent  and always have Physician oversight!).   Total of 60 minutes, greater than 50% of which was spent in discussion of treatment and management options.   Subjective:   [name] is a 59 y.o. female presenting today for follow up of  Chief Complaint  Patient presents with   Follow-up    No concerns    [name] has a history of the following: Patient Active Problem List   Diagnosis Date Noted   Constipation 12/26/2023   Iron deficiency anemia 10/24/2023   Anemia 07/01/2023   Chest pain of uncertain etiology 07/01/2023   Urinary urgency 11/08/2022   Urge incontinence 11/08/2022   Nocturia 11/08/2022   S/P implantation of urinary electronic stimulator device 11/08/2022   HLD (hyperlipidemia) 10/10/2022   Hardening of the aorta (main artery of the heart) 09/10/2022   Easy bruising 07/31/2022   Overactive bladder 06/30/2022   Severe persistent asthma without complication (HCC) 06/06/2022   Gait abnormality 05/28/2022   Steroid-induced adrenal suppression 03/30/2022   Minimal cognitive impairment 03/19/2022   Moderate recurrent major depression (HCC) 10/23/2021   Osteopenia 10/23/2021   GERD (gastroesophageal reflux disease) 10/23/2021   Endocrine disorder, unspecified 07/04/2021   Prediabetes 07/04/2021   Hypothyroidism 07/04/2021   Former smoker 06/29/2021   Seasonal and perennial allergic rhinitis 06/19/2021   Oxygen  dependent 06/19/2021   Steroid dependent (HCC) 06/19/2021   Immunosuppressed status 06/19/2021   Major depressive disorder 02/16/2021   Radiculopathy, lumbar region 12/08/2020   Chronic obstructive pulmonary disease (HCC)  10/19/2020   Anxiety 10/13/2020   Status post left hip replacement 07/22/2020   Status post right hip replacement 05/02/2020   Status post total replacement of right hip 04/15/2020   Avascular necrosis of bone of left hip (HCC) 02/10/2020   Avascular necrosis of bone of right hip (HCC) 02/10/2020   Allergic rhinitis  05/21/2019   Asthma-COPD overlap syndrome (HCC) 04/30/2019   Chronic respiratory failure with hypoxia (HCC) 04/30/2019   Spinal stenosis of cervical region 04/05/2017   Partial tear of right subscapularis tendon    Incisional hernia, without obstruction or gangrene    Urinary frequency 03/21/2012   Migraine equivalent syndrome 03/21/2012   Insomnia 03/21/2012    History obtained from: chart review and patient.  Discussed the use of AI scribe software for clinical note transcription with the patient and/or guardian, who gave verbal consent to proceed.  [name] is a 59 y.o. female presenting for a follow up visit.  She was last seen in July 2025.  At that time, Lyme testing looked a bit lower, but we did not make any changes.  We tried to talk about adding Ohtuvayre  to her regimen.  We continue with Breztri  2 puffs twice daily as well as prednisone  10 mg daily, Singulair  10 mg daily, and Fasenra  every 8 weeks.  For her rhinitis, we continue with Zyrtec as well as Singulair  and Nasacort .  Since last visit, she has done pretty well.  Asthma/Respiratory Symptom History: She is currently on Fasenra  for asthma, which she finds helpful, but continues to experience shortness of breath during physical activities such as exercise or household chores. She manages these episodes with Xopenex . Her asthma management also includes Singulair . She uses oxygen  therapy with 2 liters at night and 3 liters during the day, facilitated by a purchased oxygen  concentrator. She recently had a respiratory virus about a week and a half ago, which she managed with Mucinex. She does see Dr. Darlean with her last visit in August 2025.ert.  At that time, her dyspnea had worsened.  She was using her albuterol  1-2 times a day.  She remained on 2 L/min to 3 L/min of oxygen .    Her medication regimen includes prednisone , taking 10 mg daily for her breathing and 5 mg for adrenal replacement due to adrenal insufficiency. She has  previously increased her steroid dose during illness, using a tapering pack of 10 mg tablets, but currently does not have a refill for this. She has been doubling her 5 mg tablets to manage this need.  She sees Dr. Mallipeddi for management of chest pain.  She was last seen in June 2025.  At that time, she was having chest pain at rest and at night when lying down.  She was on famotidine  and PPI twice daily.  She had previously undergone an echocardiogram, stress test, and event monitor in 2021 that were all unremarkable.  She had a cardiac PET/CT stress test ordered.  These were both normal, pointing towards a noncardiac etiology of her chest pain.  Allergic Rhinitis Symptom History: She remains on the cetirizine and montelukast . She also has Nasacort .   GERD Symptom History: She does see Dr. Eartha.  Her last appointment was in September 2025.  At that time, GERD was better controlled on Voquenza. She is concerned about the cost, which is $900. She is seeking assistance to obtain samples to continue her medication until her insurance coverage improves in February. Constipation had improved.   She has stage 3 kidney disease  and are unable to take Tylenol  or ibuprofen due to kidney concerns. Management includes financial assistance and disability support.  She has a history of severe depression following her mother's passing in November a year ago, for which she is on antidepressants. She also has a bladder implant with a neurotransmitter to manage overactive bladder symptoms.   She has a history of cataract surgery and is scheduled to see her ophthalmologist in March for ongoing vision issues. She has had both hips replaced due to osteonecrosis related to prednisone  use, with the surgeries occurring in January and April 2022. She experiences some nerve pain post-surgery, which she manages with gabapentin .  She is up to date on her vaccinations, including flu, RSV, and pneumonia shots. She engages  in regular physical activity using a walking pad and leg exercises to maintain her health.   Otherwise, there have been no changes to her past medical history, surgical history, family history, or social history.    Review of systems otherwise negative other than that mentioned in the HPI.    Objective:   Blood pressure 100/60, pulse 91, temperature 98.3 F (36.8 C), SpO2 98%. There is no height or weight on file to calculate BMI.    Physical Exam Vitals reviewed.  Constitutional:      Appearance: She is well-developed.     Comments: Wearing oxygen .  Smiling.  Cooperative with the exam.  HENT:     Head: Normocephalic and atraumatic.     Right Ear: Tympanic membrane, ear canal and external ear normal.     Left Ear: Tympanic membrane, ear canal and external ear normal.     Nose: No nasal deformity, septal deviation, mucosal edema or rhinorrhea.     Right Turbinates: Enlarged, swollen and pale.     Left Turbinates: Enlarged, swollen and pale.     Right Sinus: No maxillary sinus tenderness or frontal sinus tenderness.     Left Sinus: No maxillary sinus tenderness or frontal sinus tenderness.     Comments: Nares dry from the oxygen  cannula.  No nasal polyps.    Mouth/Throat:     Lips: Pink.     Mouth: Mucous membranes are moist. Mucous membranes are not pale and not dry.     Pharynx: Uvula midline.  Eyes:     General: Lids are normal. Allergic shiner present.        Right eye: No discharge.        Left eye: No discharge.     Conjunctiva/sclera: Conjunctivae normal.     Right eye: Right conjunctiva is not injected. No chemosis.    Left eye: Left conjunctiva is not injected. No chemosis.    Pupils: Pupils are equal, round, and reactive to light.  Cardiovascular:     Rate and Rhythm: Normal rate and regular rhythm.     Heart sounds: Normal heart sounds.  Pulmonary:     Effort: Pulmonary effort is normal. No tachypnea, accessory muscle usage or respiratory distress.      Breath sounds: Normal breath sounds. No wheezing, rhonchi or rales.     Comments: Moving air much better today compared to previous physical exams. Coarse upper airway sounds. Chest:     Chest wall: No tenderness.  Lymphadenopathy:     Cervical: No cervical adenopathy.  Skin:    General: Skin is warm.     Capillary Refill: Capillary refill takes less than 2 seconds.     Coloration: Skin is not pale.     Findings: No  abrasion, erythema, petechiae or rash. Rash is not papular, urticarial or vesicular.     Comments: No eczematous lesions noted.   Neurological:     Mental Status: She is alert.  Psychiatric:        Behavior: Behavior is cooperative.      Diagnostic studies: none     Marty Shaggy, MD  [facility]        "

## 2024-04-29 NOTE — Patient Instructions (Addendum)
 1. Asthma-COPD overlap syndrome - Lung testing not done today (we will do it next time). - We will not make any changes today.  - I did send in prednisone  to use during periods of illness.  - Daily controller medication(s): Breztri  two puffs TWICE DAILY WITH SPACER + prednisone  10mg   daily + Singulair  10mg  daily + Fasenra  every 8 weeks - Rescue medications: Xopenex  4 puffs every 4-6 hours as needed or Xopenex  nebulizer one vial every 4-6 hours as needed - Asthma control goals:  * Full participation in all desired activities (may need albuterol  before activity) * Albuterol  use two time or less a week on average (not counting use with activity) * Cough interfering with sleep two time or less a month * Oral steroids no more than once a year * No hospitalizations   2. Chronic rhinitis (horse, tobacco leaf, grasses, ragweed, trees, indoor molds, outdoor molds, dust mites, cat and dog) - Continue with: Zyrtec (cetirizine) 10mg  tablet once daily and Singulair  (montelukast ) 10mg  daily and Nasacort  (triamcinolone ) two sprays per nostril daily AS NEEDED - Continue with nasal saline rinses.  3. Return in about 6 months (around 10/27/2024). You can have the follow up appointment with Dr. Iva or a Nurse Practicioner (our Nurse Practitioners are excellent and always have Physician oversight!).    Please inform us  of any Emergency Department visits, hospitalizations, or changes in symptoms. Call us  before going to the ED for breathing or allergy  symptoms since we might be able to fit you in for a sick visit. Feel free to contact us  anytime with any questions, problems, or concerns.  It was a pleasure to see you again today!  Websites that have reliable patient information: 1. American Academy of Asthma, Allergy , and Immunology: www.aaaai.org 2. [employer] and Education (FARE): foodallergy.org 3. Mothers of Asthmatics: http://www.asthmacommunitynetwork.org 4. [employer] of Allergy ,  Asthma, and Immunology: www.acaai.org      Like us  on [employer] and Instagram for our latest updates!      A healthy democracy works best when [employer] participate! Make sure you are registered to vote! If you have moved or changed any of your contact information, you will need to get this updated before voting! Scan the QR codes below to learn more!

## 2024-05-01 ENCOUNTER — Encounter: Payer: Self-pay | Admitting: Allergy & Immunology

## 2024-05-04 ENCOUNTER — Telehealth: Payer: Self-pay

## 2024-05-04 NOTE — Telephone Encounter (Signed)
 Copay for Fasenra  showing as [phone]

## 2024-05-06 ENCOUNTER — Other Ambulatory Visit: Payer: Self-pay

## 2024-05-07 ENCOUNTER — Other Ambulatory Visit: Payer: Self-pay

## 2024-05-11 ENCOUNTER — Other Ambulatory Visit: Payer: Self-pay

## 2024-05-12 ENCOUNTER — Other Ambulatory Visit: Payer: Self-pay

## 2024-05-14 ENCOUNTER — Other Ambulatory Visit: Payer: Self-pay

## 2024-05-14 ENCOUNTER — Other Ambulatory Visit (HOSPITAL_COMMUNITY): Payer: Self-pay

## 2024-06-04 ENCOUNTER — Ambulatory Visit: Admitting: "Endocrinology

## 2024-11-06 ENCOUNTER — Ambulatory Visit: Admitting: Allergy & Immunology

## 2025-02-24 ENCOUNTER — Ambulatory Visit: Admitting: Urology
